# Patient Record
Sex: Male | Born: 1935 | Race: White | Hispanic: No | Smoking: Former smoker
Health system: Southern US, Community
[De-identification: ages and names within clinical notes are randomized; demographics above are authoritative.]

## PROBLEM LIST (undated history)

## (undated) DIAGNOSIS — N2889 Other specified disorders of kidney and ureter: Secondary | ICD-10-CM

## (undated) DIAGNOSIS — M1990 Unspecified osteoarthritis, unspecified site: Secondary | ICD-10-CM

## (undated) DIAGNOSIS — C801 Malignant (primary) neoplasm, unspecified: Secondary | ICD-10-CM

## (undated) DIAGNOSIS — C61 Malignant neoplasm of prostate: Secondary | ICD-10-CM

## (undated) DIAGNOSIS — Z905 Acquired absence of kidney: Secondary | ICD-10-CM

## (undated) DIAGNOSIS — Z87442 Personal history of urinary calculi: Secondary | ICD-10-CM

## (undated) DIAGNOSIS — Z9289 Personal history of other medical treatment: Secondary | ICD-10-CM

## (undated) DIAGNOSIS — N189 Chronic kidney disease, unspecified: Secondary | ICD-10-CM

## (undated) DIAGNOSIS — H3530 Unspecified macular degeneration: Secondary | ICD-10-CM

## (undated) DIAGNOSIS — I10 Essential (primary) hypertension: Secondary | ICD-10-CM

## (undated) DIAGNOSIS — I2510 Atherosclerotic heart disease of native coronary artery without angina pectoris: Secondary | ICD-10-CM

## (undated) DIAGNOSIS — I219 Acute myocardial infarction, unspecified: Secondary | ICD-10-CM

## (undated) DIAGNOSIS — R42 Dizziness and giddiness: Secondary | ICD-10-CM

## (undated) DIAGNOSIS — N429 Disorder of prostate, unspecified: Secondary | ICD-10-CM

## (undated) DIAGNOSIS — E785 Hyperlipidemia, unspecified: Secondary | ICD-10-CM

## (undated) HISTORY — DX: Unspecified osteoarthritis, unspecified site: M19.90

## (undated) HISTORY — DX: Disorder of prostate, unspecified: N42.9

## (undated) HISTORY — DX: Acquired absence of kidney: Z90.5

## (undated) HISTORY — DX: Essential (primary) hypertension: I10

## (undated) HISTORY — PX: JOINT REPLACEMENT: SHX530

## (undated) HISTORY — DX: Hyperlipidemia, unspecified: E78.5

## (undated) HISTORY — PX: KIDNEY SURGERY: SHX687

## (undated) HISTORY — DX: Chronic kidney disease, unspecified: N18.9

## (undated) HISTORY — DX: Acute myocardial infarction, unspecified: I21.9

## (undated) HISTORY — PX: OTHER SURGICAL HISTORY: SHX169

## (undated) HISTORY — DX: Personal history of other medical treatment: Z92.89

## (undated) HISTORY — DX: Atherosclerotic heart disease of native coronary artery without angina pectoris: I25.10

## (undated) HISTORY — PX: PROSTATE BIOPSY: SHX241

---

## 1991-12-06 DIAGNOSIS — I219 Acute myocardial infarction, unspecified: Secondary | ICD-10-CM

## 1991-12-06 HISTORY — DX: Acute myocardial infarction, unspecified: I21.9

## 1991-12-06 HISTORY — PX: ANGIOPLASTY: SHX39

## 1995-12-06 HISTORY — PX: OTHER SURGICAL HISTORY: SHX169

## 1996-03-15 HISTORY — PX: CORONARY ANGIOPLASTY WITH STENT PLACEMENT: SHX49

## 2000-12-05 HISTORY — PX: OTHER SURGICAL HISTORY: SHX169

## 2000-12-28 ENCOUNTER — Ambulatory Visit (HOSPITAL_BASED_OUTPATIENT_CLINIC_OR_DEPARTMENT_OTHER): Admission: RE | Admit: 2000-12-28 | Discharge: 2000-12-28 | Payer: Self-pay | Admitting: *Deleted

## 2001-03-15 ENCOUNTER — Encounter (INDEPENDENT_AMBULATORY_CARE_PROVIDER_SITE_OTHER): Payer: Self-pay | Admitting: Specialist

## 2001-03-15 ENCOUNTER — Other Ambulatory Visit: Admission: RE | Admit: 2001-03-15 | Discharge: 2001-03-15 | Payer: Self-pay | Admitting: Urology

## 2001-03-29 ENCOUNTER — Encounter: Admission: RE | Admit: 2001-03-29 | Discharge: 2001-03-29 | Payer: Self-pay | Admitting: Urology

## 2001-03-29 ENCOUNTER — Encounter: Payer: Self-pay | Admitting: Urology

## 2001-04-03 ENCOUNTER — Ambulatory Visit (HOSPITAL_COMMUNITY): Admission: RE | Admit: 2001-04-03 | Discharge: 2001-04-03 | Payer: Self-pay | Admitting: Urology

## 2001-04-03 ENCOUNTER — Encounter: Payer: Self-pay | Admitting: Urology

## 2001-04-05 ENCOUNTER — Encounter: Admission: RE | Admit: 2001-04-05 | Discharge: 2001-07-04 | Payer: Self-pay | Admitting: Radiation Oncology

## 2001-04-06 ENCOUNTER — Ambulatory Visit (HOSPITAL_COMMUNITY): Admission: RE | Admit: 2001-04-06 | Discharge: 2001-04-06 | Payer: Self-pay | Admitting: Cardiovascular Disease

## 2001-04-06 ENCOUNTER — Encounter: Payer: Self-pay | Admitting: Cardiovascular Disease

## 2001-05-03 ENCOUNTER — Inpatient Hospital Stay (HOSPITAL_COMMUNITY): Admission: RE | Admit: 2001-05-03 | Discharge: 2001-05-09 | Payer: Self-pay | Admitting: Urology

## 2001-05-03 ENCOUNTER — Encounter (INDEPENDENT_AMBULATORY_CARE_PROVIDER_SITE_OTHER): Payer: Self-pay | Admitting: Specialist

## 2001-05-03 ENCOUNTER — Encounter: Payer: Self-pay | Admitting: Urology

## 2001-07-09 ENCOUNTER — Ambulatory Visit: Admission: RE | Admit: 2001-07-09 | Discharge: 2001-10-07 | Payer: Self-pay | Admitting: Radiation Oncology

## 2001-08-24 ENCOUNTER — Encounter: Payer: Self-pay | Admitting: Urology

## 2001-09-27 ENCOUNTER — Ambulatory Visit (HOSPITAL_COMMUNITY): Admission: RE | Admit: 2001-09-27 | Discharge: 2001-09-27 | Payer: Self-pay | Admitting: Urology

## 2001-09-27 ENCOUNTER — Encounter: Payer: Self-pay | Admitting: Urology

## 2001-10-19 ENCOUNTER — Ambulatory Visit: Admission: RE | Admit: 2001-10-19 | Discharge: 2002-01-17 | Payer: Self-pay | Admitting: Radiation Oncology

## 2002-05-14 ENCOUNTER — Encounter: Admission: RE | Admit: 2002-05-14 | Discharge: 2002-05-14 | Payer: Self-pay | Admitting: Urology

## 2002-05-14 ENCOUNTER — Encounter: Payer: Self-pay | Admitting: Urology

## 2002-12-05 HISTORY — PX: OTHER SURGICAL HISTORY: SHX169

## 2003-03-01 ENCOUNTER — Emergency Department (HOSPITAL_COMMUNITY): Admission: EM | Admit: 2003-03-01 | Discharge: 2003-03-01 | Payer: Self-pay | Admitting: *Deleted

## 2003-09-10 ENCOUNTER — Encounter (INDEPENDENT_AMBULATORY_CARE_PROVIDER_SITE_OTHER): Payer: Self-pay | Admitting: *Deleted

## 2003-09-10 ENCOUNTER — Ambulatory Visit (HOSPITAL_COMMUNITY): Admission: RE | Admit: 2003-09-10 | Discharge: 2003-09-10 | Payer: Self-pay | Admitting: *Deleted

## 2003-12-06 HISTORY — PX: OTHER SURGICAL HISTORY: SHX169

## 2003-12-22 ENCOUNTER — Observation Stay (HOSPITAL_COMMUNITY): Admission: EM | Admit: 2003-12-22 | Discharge: 2003-12-23 | Payer: Self-pay | Admitting: *Deleted

## 2003-12-22 HISTORY — PX: CARDIAC CATHETERIZATION: SHX172

## 2004-08-11 ENCOUNTER — Ambulatory Visit (HOSPITAL_COMMUNITY): Admission: RE | Admit: 2004-08-11 | Discharge: 2004-08-11 | Payer: Self-pay | Admitting: Urology

## 2004-08-30 ENCOUNTER — Encounter (INDEPENDENT_AMBULATORY_CARE_PROVIDER_SITE_OTHER): Payer: Self-pay | Admitting: *Deleted

## 2004-08-30 ENCOUNTER — Inpatient Hospital Stay (HOSPITAL_COMMUNITY): Admission: RE | Admit: 2004-08-30 | Discharge: 2004-09-03 | Payer: Self-pay | Admitting: Urology

## 2005-02-21 ENCOUNTER — Inpatient Hospital Stay (HOSPITAL_COMMUNITY): Admission: RE | Admit: 2005-02-21 | Discharge: 2005-02-24 | Payer: Self-pay | Admitting: Orthopedic Surgery

## 2005-04-01 ENCOUNTER — Ambulatory Visit: Payer: Self-pay | Admitting: Oncology

## 2005-11-14 ENCOUNTER — Ambulatory Visit: Payer: Self-pay | Admitting: Oncology

## 2006-03-15 ENCOUNTER — Encounter: Admission: RE | Admit: 2006-03-15 | Discharge: 2006-04-07 | Payer: Self-pay | Admitting: *Deleted

## 2006-05-17 ENCOUNTER — Ambulatory Visit: Payer: Self-pay | Admitting: Oncology

## 2006-07-30 ENCOUNTER — Emergency Department (HOSPITAL_COMMUNITY): Admission: EM | Admit: 2006-07-30 | Discharge: 2006-07-30 | Payer: Self-pay | Admitting: Emergency Medicine

## 2006-10-09 ENCOUNTER — Ambulatory Visit (HOSPITAL_COMMUNITY): Admission: RE | Admit: 2006-10-09 | Discharge: 2006-10-09 | Payer: Self-pay | Admitting: Oncology

## 2006-11-15 ENCOUNTER — Ambulatory Visit: Payer: Self-pay | Admitting: Oncology

## 2007-11-23 ENCOUNTER — Ambulatory Visit: Payer: Self-pay | Admitting: Oncology

## 2007-11-28 ENCOUNTER — Ambulatory Visit: Payer: Self-pay | Admitting: *Deleted

## 2007-11-28 ENCOUNTER — Ambulatory Visit (HOSPITAL_COMMUNITY): Admission: RE | Admit: 2007-11-28 | Discharge: 2007-11-28 | Payer: Self-pay | Admitting: Family Medicine

## 2007-11-28 ENCOUNTER — Encounter (INDEPENDENT_AMBULATORY_CARE_PROVIDER_SITE_OTHER): Payer: Self-pay | Admitting: Family Medicine

## 2008-02-10 ENCOUNTER — Observation Stay (HOSPITAL_COMMUNITY): Admission: EM | Admit: 2008-02-10 | Discharge: 2008-02-11 | Payer: Self-pay | Admitting: Emergency Medicine

## 2009-05-02 ENCOUNTER — Encounter: Payer: Self-pay | Admitting: Emergency Medicine

## 2009-05-02 ENCOUNTER — Inpatient Hospital Stay (HOSPITAL_COMMUNITY): Admission: EM | Admit: 2009-05-02 | Discharge: 2009-05-07 | Payer: Self-pay | Admitting: Cardiology

## 2009-05-02 ENCOUNTER — Ambulatory Visit: Payer: Self-pay | Admitting: Diagnostic Radiology

## 2009-05-05 ENCOUNTER — Encounter (INDEPENDENT_AMBULATORY_CARE_PROVIDER_SITE_OTHER): Payer: Self-pay | Admitting: General Surgery

## 2009-05-05 HISTORY — PX: CHOLECYSTECTOMY: SHX55

## 2009-07-06 ENCOUNTER — Inpatient Hospital Stay (HOSPITAL_COMMUNITY): Admission: RE | Admit: 2009-07-06 | Discharge: 2009-07-10 | Payer: Self-pay | Admitting: Orthopedic Surgery

## 2009-08-03 ENCOUNTER — Encounter: Admission: RE | Admit: 2009-08-03 | Discharge: 2009-08-31 | Payer: Self-pay | Admitting: Orthopedic Surgery

## 2010-07-08 ENCOUNTER — Inpatient Hospital Stay (HOSPITAL_COMMUNITY): Admission: AD | Admit: 2010-07-08 | Discharge: 2010-07-10 | Payer: Self-pay | Admitting: Cardiovascular Disease

## 2010-07-09 HISTORY — PX: CARDIAC CATHETERIZATION: SHX172

## 2010-08-04 DIAGNOSIS — Z9289 Personal history of other medical treatment: Secondary | ICD-10-CM

## 2010-08-04 HISTORY — DX: Personal history of other medical treatment: Z92.89

## 2011-01-26 NOTE — Discharge Summary (Signed)
NAME:  Donald Todd, Donald Todd NO.:  [id number]  MEDICAL RECORD NO.:  [id number]            PATIENT TYPE:  LOCATION:                                 FACILITY:  PHYSICIAN:  Nanetta Batty, M.D.   DATE OF BIRTH:  01-Feb-1929  DATE OF ADMISSION:  07/08/2010 DATE OF DISCHARGE:  07/10/2010                              DISCHARGE SUMMARY   DISCHARGE DIAGNOSES: 1. Unstable angina, catheterization this admission, plan is for     continued medical therapy. 2. Known coronary artery disease with right coronary artery and ramus     intermedius stenting in the 1990s, patent at this admission, 60% to     70% circumflex this admission, plan for medical therapy only. 3. Normal left ventricular function with an ejection fraction of 55%. 4. History of left nephrectomy for renal cell cancer, the patient does     have 40% right renal artery narrowing. 5. Stage III renal insufficiency with a creatinine of 1.4 to 1.8. 6. Treated dyslipidemia. 7. History of hypertension, which is controlled. 8. History of gallstone pancreatitis, status post laparoscopic     cholecystectomy in May 2010.  HOSPITAL COURSE:  The patient is a pleasant 77 year old male followed by Dr. Allyson Sabal.  He has had previous RCA and ramus stenting in 1997.  He was studied in 2005 and stents were patent with moderate disease in the LAD. 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  He has seen Dr. Allyson Sabal as an outpatient on July 05, 2010.  He had been having chest pain as an outpatient, which Dr. Allyson Sabal felt was consistent with angina.  This has been gradually worsening over the last couple weeks.  He is on maximal medical therapy. He was admitted for hydration and to hold his ACE inhibitor prior to catheterization.  He also has a history of contrast allergy and he will need to be premedicated for that.  The patient was admitted on July 08, 2010.  Creatinine was 1.49.  He  was premedicated for catheterization and this was done on July 09, 2010.  Proximal RCA stent was patent.  Left main was normal.  Circumflex had a 60% to 70% mid narrowing.  The ramus stent was patent.  The LAD had a 30% to 40% mid narrowing after the second diagonal.  Ejection fraction was 55%.  Plan is for continued medical therapy.  The patient was kept and hydrated overnight.  His creatinine at discharge is 1.53.  Dr. Tresa Endo felt he could be discharged on July 10, 2010.  He will follow up with Dr. Allyson Sabal as an outpatient. Dr. Tresa Endo did suggest an outpatient Myoview study to further evaluate the circumflex.  LABS AT DISCHARGE:  Sodium 145, potassium 4.3, BUN 17, creatinine 1.53. White count 5.4, hemoglobin 11.6, hematocrit 34.8, platelets 116.  Chest x-ray shows mild cardiomegaly, but no acute disease.  EKG shows sinus rhythm, sinus bradycardia with first-degree AV block and left anterior fascicular block, poor anterior R-wave progression.  Please see med rec for complete discharge medications.  DISPOSITION:  The patient was discharged  in stable condition and will follow up with Dr. Allyson Sabal.     Abelino Derrick, P.A.   ______________________________ Nanetta Batty, M.D.    Lenard Lance  D:  01/11/2011  T:  01/12/2011  Job:  [id number]  Electronically Signed by Corine Shelter P.A. on 01/14/2011 04:00:30 PM Electronically Signed by Nanetta Batty M.D. on 01/26/2011 11:33:59 AM

## 2011-02-18 LAB — COMPREHENSIVE METABOLIC PANEL
ALT: 15 U/L (ref 0–53)
AST: 16 U/L (ref 0–37)
Albumin: 2.9 g/dL — ABNORMAL LOW (ref 3.5–5.2)
Calcium: 8.6 mg/dL (ref 8.4–10.5)
Creatinine, Ser: 1.53 mg/dL — ABNORMAL HIGH (ref 0.4–1.5)
GFR calc Af Amer: 53 mL/min — ABNORMAL LOW (ref >60)
Sodium: 145 mEq/L (ref 135–145)
Total Protein: 5.1 g/dL — ABNORMAL LOW (ref 6.0–8.3)

## 2011-02-18 LAB — URINALYSIS, ROUTINE W REFLEX MICROSCOPIC
Bilirubin Urine: NEGATIVE
Hgb urine dipstick: NEGATIVE
Ketones, ur: NEGATIVE mg/dL
Specific Gravity, Urine: 1.01 (ref 1.005–1.030)
pH: 5.5 (ref 5.0–8.0)

## 2011-02-18 LAB — PROTIME-INR
INR: 1.06 (ref 0.00–1.49)
Prothrombin Time: 14 seconds (ref 11.6–15.2)
Prothrombin Time: 14.2 seconds (ref 11.6–15.2)

## 2011-02-18 LAB — BASIC METABOLIC PANEL
BUN: 19 mg/dL (ref 6–23)
CO2: 25 mEq/L (ref 19–32)
CO2: 26 mEq/L (ref 19–32)
Calcium: 8.9 mg/dL (ref 8.4–10.5)
Calcium: 9 mg/dL (ref 8.4–10.5)
Chloride: 112 mEq/L (ref 96–112)
Creatinine, Ser: 1.49 mg/dL (ref 0.4–1.5)
Creatinine, Ser: 1.5 mg/dL (ref 0.4–1.5)
GFR calc Af Amer: 55 mL/min — ABNORMAL LOW (ref >60)
GFR calc Af Amer: 55 mL/min — ABNORMAL LOW (ref >60)
GFR calc non Af Amer: 45 mL/min — ABNORMAL LOW (ref >60)
Glucose, Bld: 130 mg/dL — ABNORMAL HIGH (ref 70–99)
Potassium: 4 mEq/L (ref 3.5–5.1)
Sodium: 140 mEq/L (ref 135–145)
Sodium: 141 mEq/L (ref 135–145)

## 2011-02-18 LAB — CBC
Hemoglobin: 12.6 g/dL — ABNORMAL LOW (ref 13.0–17.0)
MCH: 29.7 pg (ref 26.0–34.0)
MCH: 29.8 pg (ref 26.0–34.0)
MCH: 30.6 pg (ref 26.0–34.0)
MCHC: 33.3 g/dL (ref 30.0–36.0)
MCHC: 34.7 g/dL (ref 30.0–36.0)
MCV: 88.9 fL (ref 78.0–100.0)
Platelets: 116 10*3/uL — ABNORMAL LOW (ref 150–400)
Platelets: 118 10*3/uL — ABNORMAL LOW (ref 150–400)
Platelets: 131 10*3/uL — ABNORMAL LOW (ref 150–400)
RBC: 4.12 MIL/uL — ABNORMAL LOW (ref 4.22–5.81)
RDW: 13.2 % (ref 11.5–15.5)
WBC: 6.5 10*3/uL (ref 4.0–10.5)

## 2011-02-18 LAB — APTT: aPTT: 31 seconds (ref 24–37)

## 2011-03-12 LAB — BASIC METABOLIC PANEL
BUN: 15 mg/dL (ref 6–23)
CO2: 27 mEq/L (ref 19–32)
CO2: 28 mEq/L (ref 19–32)
CO2: 29 mEq/L (ref 19–32)
Calcium: 8.5 mg/dL (ref 8.4–10.5)
Chloride: 101 mEq/L (ref 96–112)
Chloride: 101 mEq/L (ref 96–112)
Chloride: 102 mEq/L (ref 96–112)
Creatinine, Ser: 1.43 mg/dL (ref 0.4–1.5)
Creatinine, Ser: 1.51 mg/dL — ABNORMAL HIGH (ref 0.4–1.5)
GFR calc Af Amer: 54 mL/min — ABNORMAL LOW (ref >60)
GFR calc non Af Amer: 49 mL/min — ABNORMAL LOW (ref >60)
Glucose, Bld: 117 mg/dL — ABNORMAL HIGH (ref 70–99)
Glucose, Bld: 135 mg/dL — ABNORMAL HIGH (ref 70–99)
Glucose, Bld: 138 mg/dL — ABNORMAL HIGH (ref 70–99)
Glucose, Bld: 176 mg/dL — ABNORMAL HIGH (ref 70–99)
Potassium: 4.7 mEq/L (ref 3.5–5.1)
Potassium: 4.8 mEq/L (ref 3.5–5.1)
Sodium: 133 mEq/L — ABNORMAL LOW (ref 135–145)

## 2011-03-12 LAB — CBC
HCT: 30.3 % — ABNORMAL LOW (ref 39.0–52.0)
HCT: 33.1 % — ABNORMAL LOW (ref 39.0–52.0)
Hemoglobin: 11.4 g/dL — ABNORMAL LOW (ref 13.0–17.0)
MCHC: 34.1 g/dL (ref 30.0–36.0)
MCHC: 34.3 g/dL (ref 30.0–36.0)
MCV: 89.5 fL (ref 78.0–100.0)
MCV: 90.2 fL (ref 78.0–100.0)
MCV: 90.5 fL (ref 78.0–100.0)
Platelets: 125 10*3/uL — ABNORMAL LOW (ref 150–400)
RBC: 3.47 MIL/uL — ABNORMAL LOW (ref 4.22–5.81)
RDW: 13.8 % (ref 11.5–15.5)
RDW: 13.9 % (ref 11.5–15.5)

## 2011-03-12 LAB — PROTIME-INR: Prothrombin Time: 26 seconds — ABNORMAL HIGH (ref 11.6–15.2)

## 2011-03-13 LAB — PROTIME-INR
INR: 1 (ref 0.00–1.49)
Prothrombin Time: 13.7 seconds (ref 11.6–15.2)

## 2011-03-13 LAB — URINALYSIS, ROUTINE W REFLEX MICROSCOPIC
Bilirubin Urine: NEGATIVE
Hgb urine dipstick: NEGATIVE
Ketones, ur: NEGATIVE mg/dL
Nitrite: NEGATIVE
pH: 5.5 (ref 5.0–8.0)

## 2011-03-13 LAB — COMPREHENSIVE METABOLIC PANEL
Albumin: 3.6 g/dL (ref 3.5–5.2)
BUN: 19 mg/dL (ref 6–23)
Chloride: 108 mEq/L (ref 96–112)
Creatinine, Ser: 1.6 mg/dL — ABNORMAL HIGH (ref 0.4–1.5)
Total Bilirubin: 0.8 mg/dL (ref 0.3–1.2)
Total Protein: 6.3 g/dL (ref 6.0–8.3)

## 2011-03-13 LAB — CBC
HCT: 39.9 % (ref 39.0–52.0)
MCV: 89.4 fL (ref 78.0–100.0)
Platelets: 142 10*3/uL — ABNORMAL LOW (ref 150–400)
RDW: 13.8 % (ref 11.5–15.5)

## 2011-03-13 LAB — APTT: aPTT: 24 seconds (ref 24–37)

## 2011-03-13 LAB — TYPE AND SCREEN

## 2011-03-14 LAB — CBC
HCT: 35.3 % — ABNORMAL LOW (ref 39.0–52.0)
HCT: 38.6 % — ABNORMAL LOW (ref 39.0–52.0)
Hemoglobin: 12.1 g/dL — ABNORMAL LOW (ref 13.0–17.0)
Hemoglobin: 13.3 g/dL (ref 13.0–17.0)
MCHC: 34.2 g/dL (ref 30.0–36.0)
MCV: 90.4 fL (ref 78.0–100.0)
MCV: 90.6 fL (ref 78.0–100.0)
RBC: 3.91 MIL/uL — ABNORMAL LOW (ref 4.22–5.81)
RBC: 4.26 MIL/uL (ref 4.22–5.81)
WBC: 7.4 10*3/uL (ref 4.0–10.5)

## 2011-03-14 LAB — COMPREHENSIVE METABOLIC PANEL
Albumin: 3.1 g/dL — ABNORMAL LOW (ref 3.5–5.2)
Alkaline Phosphatase: 139 U/L — ABNORMAL HIGH (ref 39–117)
BUN: 8 mg/dL (ref 6–23)
CO2: 28 mEq/L (ref 19–32)
Chloride: 102 mEq/L (ref 96–112)
Creatinine, Ser: 1.47 mg/dL (ref 0.4–1.5)
GFR calc non Af Amer: 46 mL/min — ABNORMAL LOW (ref >60)
Glucose, Bld: 122 mg/dL — ABNORMAL HIGH (ref 70–99)
Potassium: 3.9 mEq/L (ref 3.5–5.1)
Total Bilirubin: 1.2 mg/dL (ref 0.3–1.2)

## 2011-03-14 LAB — MAGNESIUM
Magnesium: 1.7 mg/dL (ref 1.5–2.5)
Magnesium: 1.9 mg/dL (ref 1.5–2.5)

## 2011-03-14 LAB — BASIC METABOLIC PANEL
Calcium: 9 mg/dL (ref 8.4–10.5)
GFR calc non Af Amer: 49 mL/min — ABNORMAL LOW (ref >60)
Glucose, Bld: 121 mg/dL — ABNORMAL HIGH (ref 70–99)
Potassium: 3.9 mEq/L (ref 3.5–5.1)
Sodium: 137 mEq/L (ref 135–145)

## 2011-03-14 LAB — LIPASE, BLOOD: Lipase: 43 U/L (ref 11–59)

## 2011-03-14 LAB — LIPID PANEL
Cholesterol: 124 mg/dL (ref 0–200)
HDL: 39 mg/dL — ABNORMAL LOW (ref >39)

## 2011-03-15 LAB — DIFFERENTIAL
Basophils Absolute: 0 10*3/uL (ref 0.0–0.1)
Basophils Relative: 1 % (ref 0–1)
Monocytes Absolute: 0.5 10*3/uL (ref 0.1–1.0)
Neutro Abs: 6.4 10*3/uL (ref 1.7–7.7)
Neutrophils Relative %: 72 % (ref 43–77)

## 2011-03-15 LAB — COMPREHENSIVE METABOLIC PANEL
ALT: 151 U/L — ABNORMAL HIGH (ref 0–53)
ALT: 235 U/L — ABNORMAL HIGH (ref 0–53)
ALT: 327 U/L — ABNORMAL HIGH (ref 0–53)
AST: 159 U/L — ABNORMAL HIGH (ref 0–37)
AST: 350 U/L — ABNORMAL HIGH (ref 0–37)
Albumin: 3.7 g/dL (ref 3.5–5.2)
Alkaline Phosphatase: 183 U/L — ABNORMAL HIGH (ref 39–117)
Alkaline Phosphatase: 216 U/L — ABNORMAL HIGH (ref 39–117)
BUN: 12 mg/dL (ref 6–23)
BUN: 24 mg/dL — ABNORMAL HIGH (ref 6–23)
CO2: 27 mEq/L (ref 19–32)
Calcium: 8.9 mg/dL (ref 8.4–10.5)
Calcium: 9 mg/dL (ref 8.4–10.5)
Calcium: 9.6 mg/dL (ref 8.4–10.5)
Chloride: 108 mEq/L (ref 96–112)
Chloride: 108 mEq/L (ref 96–112)
Chloride: 106 mEq/L (ref 96–112)
Creatinine, Ser: 1.64 mg/dL — ABNORMAL HIGH (ref 0.4–1.5)
GFR calc Af Amer: 49 mL/min — ABNORMAL LOW (ref >60)
GFR calc Af Amer: 51 mL/min — ABNORMAL LOW (ref >60)
GFR calc non Af Amer: 42 mL/min — ABNORMAL LOW (ref >60)
Glucose, Bld: 101 mg/dL — ABNORMAL HIGH (ref 70–99)
Glucose, Bld: 112 mg/dL — ABNORMAL HIGH (ref 70–99)
Potassium: 4 mEq/L (ref 3.5–5.1)
Potassium: 4 mEq/L (ref 3.5–5.1)
Sodium: 141 mEq/L (ref 135–145)
Sodium: 142 mEq/L (ref 135–145)
Sodium: 142 mEq/L (ref 135–145)
Total Bilirubin: 2.1 mg/dL — ABNORMAL HIGH (ref 0.3–1.2)
Total Protein: 5.4 g/dL — ABNORMAL LOW (ref 6.0–8.3)

## 2011-03-15 LAB — CBC
HCT: 43.1 % (ref 39.0–52.0)
Hemoglobin: 12.1 g/dL — ABNORMAL LOW (ref 13.0–17.0)
Hemoglobin: 14.6 g/dL (ref 13.0–17.0)
MCHC: 33.2 g/dL (ref 30.0–36.0)
MCHC: 34.1 g/dL (ref 30.0–36.0)
MCHC: 34.3 g/dL (ref 30.0–36.0)
MCV: 89.9 fL (ref 78.0–100.0)
Platelets: 144 10*3/uL — ABNORMAL LOW (ref 150–400)
RBC: 4.2 MIL/uL — ABNORMAL LOW (ref 4.22–5.81)
RBC: 4.61 MIL/uL (ref 4.22–5.81)
RDW: 13.8 % (ref 11.5–15.5)
WBC: 5.8 10*3/uL (ref 4.0–10.5)
WBC: 7.1 10*3/uL (ref 4.0–10.5)
WBC: 8.8 10*3/uL (ref 4.0–10.5)

## 2011-03-15 LAB — HEPATITIS PANEL, ACUTE
HCV Ab: NEGATIVE
Hep A IgM: NEGATIVE

## 2011-03-15 LAB — AMYLASE: Amylase: 848 U/L — ABNORMAL HIGH (ref 27–131)

## 2011-03-15 LAB — LIPASE, BLOOD
Lipase: 3540 U/L — ABNORMAL HIGH (ref 11–59)
Lipase: 71 U/L — ABNORMAL HIGH (ref 11–59)

## 2011-03-15 LAB — MAGNESIUM: Magnesium: 2.1 mg/dL (ref 1.5–2.5)

## 2011-03-15 LAB — LIPID PANEL: Cholesterol: 138 mg/dL (ref 0–200)

## 2011-03-15 LAB — POCT CARDIAC MARKERS
CKMB, poc: 1.2 ng/mL (ref 1.0–8.0)
CKMB, poc: 1.9 ng/mL (ref 1.0–8.0)
Myoglobin, poc: 171 ng/mL (ref 12–200)

## 2011-03-15 LAB — TSH: TSH: 1.331 u[IU]/mL (ref 0.350–4.500)

## 2011-03-15 LAB — HEMOGLOBIN A1C
Hgb A1c MFr Bld: 5.8 % (ref 4.6–6.1)
Mean Plasma Glucose: 120 mg/dL

## 2011-04-19 NOTE — Op Note (Signed)
NAME:  Donald Todd, Donald Todd NO.:  [id number]   MEDICAL RECORD NO.:  [id number]          PATIENT TYPE:  INP   LOCATION:  4703                         FACILITY:  [facility]   PHYSICIAN:  Sharlet Salina T. Hoxworth, M.D.DATE OF BIRTH:  [birth year]   DATE OF PROCEDURE:  05/05/2009  DATE OF DISCHARGE:                               OPERATIVE REPORT   PREOPERATIVE DIAGNOSIS:  Gallstone pancreatitis.   POSTOPERATIVE DIAGNOSIS:  Gallstone pancreatitis.   SURGICAL PROCEDURES:  Laparoscopic cholecystectomy with intraoperative  cholangiogram.   SURGEON:  Sharlet Salina T. Hoxworth, MD   ANESTHESIA:  General.   BRIEF HISTORY:  Mr. Brawley is a 77 year old male who presented with  acute severe abdominal pain, was found to have gallstones and evidence  of acute pancreatitis, which has clinically resolved.  We would  recommend proceeding with laparoscopic cholecystectomy with  cholangiogram to prevent recurrence and further complications from his  gallstones.  The nature of the procedure, its indications, risks of  bleeding, infection, bile leak, bile duct injury, and possible need for  procedure and anesthetic complications were discussed and understood.  He was now brought to the operating room for this procedure.   DESCRIPTION OF OPERATION:  The patient was brought to the operating room  and placed in supine position on the operating table, and general  endotracheal anesthesia was induced.  He received preoperative IV  antibiotics.  PAS were placed.  The abdomen was widely and sterilely,  prepped and draped.  Correct patient and procedure were verified.  Local  anesthesia was used to infiltrate the trocar sites.  Access was obtained  with a 1 cm incision at the umbilicus, open Hasson technique through a  mattress suture of 0 Vicryl and pneumoperitoneum established.  Under  direct vision, a 11-mm trocar was placed in subxiphoid area and two 5-mm  trocars along the right subcostal margin.  The  gallbladder was  visualized and did not appear significantly inflamed.  The fundus was  grasped and elevated up over the liver and infundibulum retracted  inferolaterally.  Peritoneum anterior and posterior to closed triangle  was incised and fibrofatty tissue was stripped off the neck of the  gallbladder toward the porta hepatis.  The distal gallbladder and closed  triangle was thoroughly dissected.  The cystic artery was found lying  anterior and inferior to the cystic duct, was divided between 2 proximal  and distal clip.  The cystic duct was then dissected out over about a  centimeter and half.  The cystic duct gallbladder junction was dissected  at 306 degrees.  The anatomy was clear.  The cystic duct was clipped in  gallbladder junction and operative cholangiogram was obtained, which  showed good filling of normal common bile duct and intrahepatic ducts  with free flow into the duodenum and no filling defects.  Following  this, cholangiocath was removed and the cystic duct was triply clipped  proximally and divided.  The gallbladder was then dissected free from  its bed using hook cautery and removed through the umbilicus.  There was  little bleeding from the gallbladder bed, it was controlled with  cautery  and a Surgicel pack.  The right upper  quadrant was irrigated complete hemostasis was assured.  Trocars were  then removed and all CO2 evacuated.  The mattress suture was secured at  the umbilicus.  Skin incisions were closed with subcuticular Monocryl  and Steri-Strips.  The patient was taken to recovery room in good  condition.      Lorne Skeens. Hoxworth, M.D.  Electronically Signed     BTH/MEDQ  D:  05/05/2009  T:  05/06/2009  Job:  [id number]

## 2011-04-19 NOTE — Discharge Summary (Signed)
NAME:  Donald Todd, Donald Todd NO.:  [id number]   MEDICAL RECORD NO.:  [id number]          PATIENT TYPE:  INP   LOCATION:  4703                         FACILITY:  [facility]   PHYSICIAN:  Petra Kuba, M.D.    DATE OF BIRTH:  Jun 28, 1929   DATE OF ADMISSION:  05/02/2009  DATE OF DISCHARGE:  05/07/2009                               DISCHARGE SUMMARY   ADMIT DIAGNOSIS:  Gallstone pancreatitis.   DISCHARGE DIAGNOSES:  1. Gallstone pancreatitis, status post laparoscopic cholecystectomy      also an episode of asymptomatic supraventricular tachycardia.   PAST MEDICAL HISTORY:  The rest of his discharge diagnoses are  consistent with his past medical history and include:  1. Coronary artery disease.  2. Hypertension.  3. Cholesterolemia.  4. Chronic renal insufficiency.  5. History of renal cell cancer.  6. History of prostate cancer as well as osteoarthritis.   CONSULTS:  1. Gastroenterology was consulted on May 02, 2009.  2. Surgery was consulted on May 03, 2009.   PROCEDURES:  The patient had a laparoscopic cholecystectomy with  intraoperative cholangiography on May 05, 2009.  This was done by Dr.  Glenna Fellows without complication.  The intraoperative  cholangiography was found to be negative.   PERTINENT RADIOLOGICAL EXAMINATIONS:  CT abdomen and pelvis on May 02, 2009, significant for:  1. Inflammatory changes about the pancreas worrisome for acute      pancreatitis.  2. Cholelithiasis.  3. Right inguinal hernia containing adipose tissue.  Also, on May 05, 2009, the patient had an intraoperative cholangiogram that was      negative for retained common bile duct stones.   PERTINENT LABORATORIES THIS ADMISSION:  On the day of admission, May 02, 2009, the patient had point of care cardiac enzymes that were as  follows, CK-MB 1.2, troponin 0.05, and myoglobin 107.   On May 02, 2009, he had an amylase of 3364 and he had a lipase of 3540.  LFTs on May 02, 2009, were as follows; AST 350, ALT 327, total bilirubin  4.3, and alkaline phosphatase 89.   On May 06, 2009, the patient's LFTs were as follows; AST 49, ALT 93, alk  phos 139, and total bilirubin 1.2.  Lipase normalized to 43 on May 05, 2009.   HISTORY AND BRIEF HOSPITAL COURSE:  Mr. Mounts is a 77 year old male who  has a history of prostate cancer and renal cell cancer.  He was admitted  on May 02, 2009, to the Cardiology Service for chest pain.  After  negative cardiac workup, the cardiologist consulted [facility]  Gastroenterology with Dr. Vida Rigger who assessed the patient and  diagnosed and was gallstone pancreatitis.  Surgery was then consulted.  The patient improved quickly with clear liquid diet and pain  medications.  Laparoscopic cholecystectomy was scheduled for May 05, 2009, and took place without complication.  The patient was then  followed for another 48 hours to monitor him for any postop  complications or cardiac effects.  On May 05, 2009, he did have episodes  of asymptomatic SVT.  He was seen by Dr. Allyson Sabal who recommended  outpatient stress testing to exclude cardiac ischemia.  Today, on May 07, 2009, the patient is alert and oriented.  He is tolerating a regular  diet.  He has not used any pain medications since his surgery and has no  complaints of pain.  He is able to ambulate around the room with ease.   PHYSICAL EXAMINATION:  HEART:  Regular rate and rhythm.  LUNGS:  Clear to auscultation.  ABDOMEN:  Bandages were clean and dry.  It is slightly tender at  surgical incision spot.  He had good bowel sounds.  He has not had a  bowel movement in 2 days.   He was seen by [facility] and Vascular extender Lezlie Octave,  this morning and cleared for discharge.  He will be discharged to home  in good condition.  Follow up appointments include an office visit with  Dr. Glenna Fellows at [facility] on May 21, 2009, at  9:00 a.m.  Also, he has  been advised to call [facility] to schedule an appointment with Dr. Allyson Sabal in 2 weeks, this will  be for an office visit and to set up an outpatient stress test,  secondary to his episodes of asymptomatic SVT in the hospital further  the patient reports that he will have knee surgery later this summer, he  will need a stress test prior to that.  The patient has been instructed  to call [facility] if his temperature rises above 101.5 or  if he has nausea, vomiting, diarrhea, constipation or if he has redness  or drainage from his wounds.   MEDICATIONS AT DISCHARGE:  1. Amlodipine 5 mg 1 tablet daily.  2. Enalapril 2.5 mg one tablet daily.  3. Imdur 90 mg daily.  4. Toprol 50 mg 1 tablet twice a day.  5. Vesicare 5 mg 1 tablet daily.  6. Vytorin 10/40 mg 1 tablet daily.  7. Fish oil 1000 mg 1 tablet daily.  8. Ocuvite with Lutein  1 tablet twice daily.  9. Prilosec OTC 1 tablet daily.  10.Aspirin 81 mg 1 tablet daily.   The patient will be discharged today to home in good condition.      Stephani Police, PA    ______________________________  Petra Kuba, M.D.    MLY/MEDQ  D:  05/07/2009  T:  05/07/2009  Job:  [id number]   cc:   Petra Kuba, M.D.  Nanetta Batty, M.D.  Lorne Skeens. Hoxworth, M.D.  Vikki Ports, M.D.

## 2011-04-19 NOTE — Op Note (Signed)
NAME:  Donald Todd, Donald NO.:  [id number]   MEDICAL RECORD NO.:  [id number]          PATIENT TYPE:  INP   LOCATION:  1612                         FACILITY:  [facility]   PHYSICIAN:  Courtney Paris, M.D.DATE OF BIRTH:  February 04, 1929   DATE OF PROCEDURE:  07/06/2009  DATE OF DISCHARGE:                               OPERATIVE REPORT   OPERATIVE NOTE AND CONSULTATION:   REASON FOR CONSULTATION:  Unable to pass a Foley catheter at surgery.   HISTORY OF PRESENT ILLNESS:  The patient is a 77 year old male with  history of prostate cancer and renal cell carcinoma.  He had a seed  implant for prostate cancer in 2002.  His current PSAs have been  undetectable.  He last saw Dr. Patsi Todd on June 24, 2009.  He also has  had a left partial nephrectomy in 2006 and a total nephrectomy for a  second tumor in 2007.  He has been on Vesicare for overactive bladder  symptoms after I-125 seed therapy.  He gets DEXA scans for osteopenia.   PROCEDURE IN DETAIL:  At surgery the nursing staff was unable to place a  Foley catheter in this patient who was having a right total knee done.  After the operation had been completed and the staples applied I was  called to the OR to pass a catheter.  The patient was still under his  regional anesthesia.  He was prepped and draped with Betadine and a  flexible cystoscope was then passed into the anterior urethra.  This was  normal.  No strictures were seen until I got down to the prostate area  and there seemed to be a mild bladder neck contracture.  Through the  scope I was able to pass a sensor guidewire into the bladder under  direct vision.  I backed this out over the guidewire and then was able  to use a Heyman dilator #22-French to dilate the bladder neck.  After  this I was able to get a #18 Council catheter over the guidewire into  the bladder atraumatically.  Clear urine was obtained.  Then 10 mL was  placed in the Foley balloon and left to  straight drainage.   PLAN:  Plan is to leave the Foley catheter until he is fully ambulatory,  but I will have Dr. Patsi Todd check him in the hospital.      Courtney Paris, M.D.  Electronically Signed     HMK/MEDQ  D:  07/06/2009  T:  07/06/2009  Job:  [id number]

## 2011-04-19 NOTE — H&P (Signed)
NAME:  Donald Todd, Donald Todd NO.:  [id number]   MEDICAL RECORD NO.:  [id number]          PATIENT TYPE:  OBV   LOCATION:  1505                         [facility]ACILITY:  [facility]   PHYSICIAN:  Corinna L. Lendell Caprice, MDDATE OF BIRTH:  11-Oct-1929   DATE OF ADMISSION:  02/10/2008  DATE OF DISCHARGE:  02/11/2008                              HISTORY & PHYSICAL   CHIEF COMPLAINT:  Fever and lower abdominal pain.   HISTORY OF PRESENT ILLNESS:  Donald Todd is a 77 year old white male with  multiple medical problems who has been ill for the past four days.  He  has dry heaves four days ago and since then has had anorexia.  He has  had no diarrhea.  He has lower abdominal pain.  He denies any dysuria.  His fever has been up to almost 102 at home.  He had some substernal and  epigastric burning after his dry heaves on Wednesday but has no  epigastric pain now.   PAST MEDICAL HISTORY:  Is significant for:  1. Coronary artery disease with stent.  2. Hypertension.  3. Macular degeneration.  4. Chronic renal insufficiency.  5. Nephrectomy for renal cell carcinoma.  6. Prostate cancer with seed implantation.  7. Hyperlipidemia.   MEDICATIONS:  1. Aspirin 325 mg a day.  2. Toprol XL 50 mg a day.  3. Imdur 60 mg a day.  4. Norvasc 5 mg a day.  5. Enalapril 2.5 mg a day.  6. Fish oil capsules.  7. Ocuvite.  8. Vytorin 10/40 mg a day.  9. Vesicare 5 mg a day.  10.Os-Cal with vitamin D.  11.Prilosec.  12.Vitamin D 1000 mg twice a day.  13.Nitroglycerin as needed which he has not used for a long time.   SOCIAL HISTORY:  The patient is here with his wife.  He does not smoke.   FAMILY HISTORY:  Is noncontributory.   REVIEW OF SYSTEMS:  As above.  Otherwise negative.   PHYSICAL EXAMINATION:  Temperature is 100.9, blood pressure 147/74,  pulse 91, respiratory rate 16, oxygen saturation 96% on room air.  GENERAL:  The patient is well-nourished, well-developed, in no acute  distress.  Able to  ambulate to the bathroom.  HEENT:  Normocephalic, atraumatic.  Pupils equal, round, reactive to  light.  Sclerae nonicteric.  Moist mucous membranes.  NECK:  Is supple.  No carotid bruits.  No lymphadenopathy.  LUNGS:  Are clear to auscultation bilaterally without wheezes, rhonchi  or rales.  CARDIOVASCULAR:  Regular rate and rhythm without murmurs, gallops or  rubs.  ABDOMEN:  Normal bowel sounds, soft, nontender, nondistended.  GENITOURINARY AND RECTAL:  Deferred.  EXTREMITIES:  No clubbing, cyanosis or edema.  Pulses are intact.  SKIN:  No rash or jaundice.  NEUROLOGIC:  Alert and oriented.  Cranial nerves and sensorimotor exam  intact.  PSYCHIATRIC:  Normal affect.   LABORATORY DATA:  His white blood cell count is 10,800 with 88%  neutrophils, 7% lymphocytes, hemoglobin 12.5, hematocrit 36.  Complete  metabolic panel significant for a creatinine of 1.69 which is about his  baseline.  Total  bilirubin of 1.8, alkaline phosphatase 161, SGOT 24,  SGPT 79, albumin 2.6.  Lipase normal.  Urinalysis shows trace ketones,  100 protein, 2 bili urobilinogen, negative nitrite, negative leukocyte  esterase, negative blood.  Preliminary reading of the CT of the abdomen  shows acute pancreatitis.   ASSESSMENT/PLAN:  1. Lower abdominal pain with fever:  Despite the CAT scan reading, the      patient's clinical presentation is not one of pancreatitis.  Also,      his labs are not indicative of a chemical pancreatitis.      Nevertheless, I will place him on 23-hour observation, clear liquid      diet, supportive care, and repeat the pancreatic enzymes in the      morning.  He has no epigastric tenderness or right upper quadrant      tenderness.  2. Coronary artery disease with stent, stable.  3. Hypertension.  4. Macular degeneration.  5. Chronic renal insufficiency, stable.  6. History of nephrectomy for renal cell carcinoma.  7. Prostate cancer.      Corinna L. Lendell Caprice, MD   Electronically Signed     CLS/MEDQ  D:  02/11/2008  T:  02/12/2008  Job:  [id number]   cc:   Vikki Ports, M.D.  Fax: [phone]   Nanetta Batty, M.D.  Fax: [phone]

## 2011-04-19 NOTE — Discharge Summary (Signed)
NAME:  Donald Todd, HABIBI NO.:  [id number]   MEDICAL RECORD NO.:  [id number]          PATIENT TYPE:  INP   LOCATION:  1612                         FACILITY:  [facility]   PHYSICIAN:  Ollen Gross, M.D.    DATE OF BIRTH:  07-31-1929   DATE OF ADMISSION:  07/06/2009  DATE OF DISCHARGE:  07/10/2009                               DISCHARGE SUMMARY   ADMITTING DIAGNOSES:  1. Osteoarthritis right knee.  2. Hypertension.  3. Coronary arterial disease.  4. History of MI (myocardial infarction) 1993.  5. History of renal cancer requiring initial partial nephrectomy and      then complete left nephrectomy.  6. Prostate cancer status post seeding.  7. History of renal calculi.  8. 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  9. Childhood illnesses measles, mumps.   DISCHARGE DIAGNOSES:  1. Osteoarthritis right knee status post right total knee replacement      arthroplasty.  2. Difficulty Foley catheter insertion secondary to bladder neck      contracture status post dilatation and Foley placement.  3. Hypertension.  4. Coronary arterial disease.  5. History of MI (myocardial infarction) 1993.  6. History of renal cancer requiring initial partial nephrectomy and      then complete left nephrectomy.  7. Prostate cancer status post seeding.  8. History of renal calculi.  9. 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  10.Childhood illnesses measles, mumps.  11.Postop hyponatremia improved.   PROCEDURE:  The patient was taken to the OR on July 06, 2009, underwent  a right total knee.  Surgeon Dr. Lequita Halt.  Assistant Alexzandrew L.  Perkins, P.A.C.  Anesthesia spinal with Duramorph.  Also at time of  surgery the patient had bladder neck contracture dilatation and  placement of Foley catheter performed by Vic Blackbird, M.D.   HOSPITAL CONSULTATIONS:  Urology, Courtney Paris, M.D.   BRIEF HISTORY:  The patient is a 77 year old male with end-stage  arthritis right knee,  progressive worsening pain and dysfunction,  successful left total knee and now presents for right total knee.   LABORATORY DATA:  Preop CBC showed a hemoglobin of 13.8, hematocrit  39.9, white cell count 7.0, platelets 142, PT/INR 13.7 and 1.0, PTT of  24.  Chem panel on admission had mildly elevated creatinine preop on  1.6.  Remaining chem panel within normal limits.  Preop UA was negative.  Serial CBCs followed.  Hemoglobin dropped down to 11.4 then 10.7, last  noted H and H 10.5 and 30.3.  Serial pro-times followed, last noted  PT/INR 27.0 and 2.5.  Serial BMETs were followed.  Sodium did drop down  to 133 and came back to 135.  Creatinine dropped to 1.31 and went up to  1.5 and last noted at 1.4.  Remaining electrolytes remained within  normal limits.  Two view chest May 02, 2009, cardiomegaly without  pulmonary edema.  EKG May 03, 2009, sinus bradycardia, incomplete right  bundle branch block, left ventricular hypertrophy, repolarization  abnormalities since last tracing, rate slower and T-wave changes are new  confirmed by Dr. Caprice Kluver.   HOSPITAL COURSE:  The patient was admitted to [facility] and  taken to the OR and underwent the above stated procedure, had difficulty  Foley catheter placement so urology was called in the procedure was done  had dilatation of his bladder neck contracture and placement of Foley  catheter.  He tolerated the procedures well, later transferred to  recovery room and then orthopedic floor and transferred to recovery  room, given 24 hours postop IV antibiotics, started back on his home  medications, started back on his blood pressure medications on  parameters.  We left his Foley catheter in due to the difficulty of  Foley placement and wanted to make sure he was ambulatory.  It was  recommended by Dr. Patsi Sears he follow back up from his urology  standpoint.  He had a preop creatinine a little bit elevated at 1.6 but  on day 1 it was  1.4 or essentially 1.39.  Sodium was down a little bit,  felt to be a dilutional component, did have a little positive volume,  was able to diurese.  By day 2 dressing changed, incision looked good,  wound looked excellent.  He was walking about 14 feet.  We left the  Foley catheter in a little bit more and wanted to make sure he was up  and moving around very well.  By day 3 the catheter was removed but he  still had not met goals.  He was slowly progressing with PT.  Hemoglobin  was stable.  Sodium had dropped initially to 133 and was back up to 134,  improving.  We discontinued the Foley on day 3 for of voiding trial.  He  was able to void on his own and diuresing well.  By day 4 he was doing  much better with his therapy, progressing well and meeting his goals and  discharged home.   DISCHARGE PLAN:  1. Discharged home on July 10, 2009.  2. Discharge diagnoses - please see above.  3. Discharge meds - Coumadin, Robaxin, Ultram.  4. Follow up in 2 weeks.  5. Activity - total knee protocol weightbearing as tolerated at home      with PT home nursing.   DISPOSITION:  Home.   CONDITION ON DISCHARGE:  Improved.      Alexzandrew L. Perkins, P.A.C.      Ollen Gross, M.D.  Electronically Signed    ALP/MEDQ  D:  07/10/2009  T:  07/10/2009  Job:  [id number]   cc:   Nanetta Batty, M.D.  Fax: [phone]   Courtney Paris, M.D.  Fax: [phone]   Sigmund I. Patsi Sears, M.D.  Fax: [phone]

## 2011-04-19 NOTE — Consult Note (Signed)
NAME:  Donald Todd, PURSLEY NO.:  [id number]   MEDICAL RECORD NO.:  [id number]          PATIENT TYPE:  INP   LOCATION:  4703                         FACILITY:  [facility]   PHYSICIAN:  Petra Kuba, M.D.    DATE OF BIRTH:  1929/12/01   DATE OF CONSULTATION:  05/02/2009  DATE OF DISCHARGE:                                 CONSULTATION   HISTORY:  The patient seen at the request of Dr. Nanetta Batty for  abnormal liver tests and abdominal pain.  He is currently doing much  better.  He was admitted in March with lower abdominal pain and fever,  but he says that pain was different.  He was never told that he had  gallstones on the CT that was done in March.  Thursday after eating  hotdog, he had some upper abdominal pain, took some Tums and Rolaids and  felt better; however was woken up from his sleep this morning with  recurrent pain, which was significant.  He had some nausea, but could  not vomit.  No fever.  Came to the emergency room.  Liver tests were  elevated.  He thought maybe he had been told that it had been elevated  before, but he is not sure of that.  His wife is not aware of that.  He  did have a CT, which showed some mild pancreatitis and gallstones, but  no comment on the CBD.  His amylase came back at 3300 and his liver  tests have increased since admission, but he is currently doing better  and wants to eat.   PAST MEDICAL HISTORY:  Pertinent for coronary artery disease with a  stent.  He also has hypertension, increased cholesterol, chronic renal  insufficiency, status post nephrectomy for renal cell cancer, history of  prostate cancer and seed implants.  He also has arthritis and a  questionable mild history of pancreatitis in March.   FAMILY HISTORY:  Negative for any obvious GI problems.   SOCIAL HISTORY:  Does not smoke or drink.   ALLERGIES:  IV contrast dye and adhesive tape.   CURRENT MEDICINES AT HOME:  Prevacid, Toprol, Imdur, Norvasc,  enalapril,  aspirin, fish oil, Ocuvite, Vytorin, Vesicare, and Os-Cal.   REVIEW OF SYSTEMS:  Negative except above.  He has had colon polyps  removed and 2 uneventful colonoscopies, but never an upper test.   PHYSICAL EXAMINATION:  No acute distress.  Lying comfortably in the bed.  Abdomen is soft, nontender.  Cannot duplicate the pain.  No pedal edema.   LABORATORY DATA:  Labs reviewed.  See above.  Increased amylase.  CT  reviewed.   ASSESSMENT:  1. Multiple medical problems.  2. Gallstone pancreatitis, improved.   PLAN:  Clear liquids okay.  We will follow labs, examine symptoms, but  if he continues to improve, we will proceed first with a surgical  consult for a lap chole and intraoperative cholangiogram and then if the  intraoperative angiogram showed residual stones, then an ERCP; however,  if signs of CBD stone predominate, we would proceed with an ERCP or if  the patient worsens again.  I have discussed risks, benefits, and  methods of those tests with the patient and his wife and we will follow  with you and be on standby to help p.r.n., but probably if liver tests,  decreased amylase, lipase continue to drop, and he continues to be pain  free, we would consult Surgery tomorrow for hopefully lap chole in the  near future.  We did discuss, however, if he was not a surgical  candidate, which I doubted possibly just an ERCP and sphincterotomy to  open up the duct and prevent stones from impacting in the future would  be indicated.   Thank you very much.           ______________________________  Petra Kuba, M.D.     MEM/MEDQ  D:  05/02/2009  T:  05/03/2009  Job:  [id number]   cc:   Nanetta Batty, M.D.  Vikki Ports, M.D.

## 2011-04-19 NOTE — H&P (Signed)
NAME:  Donald Todd, KREIDER NO.:  [id number]   MEDICAL RECORD NO.:  [id number]          PATIENT TYPE:  INP   LOCATION:  NA                           FACILITY:  [facility]   PHYSICIAN:  Ollen Gross, M.D.    DATE OF BIRTH:  12/14/1928   DATE OF ADMISSION:  07/06/2009  DATE OF DISCHARGE:                              HISTORY & PHYSICAL   CHIEF COMPLAINT:  Right knee pain.   HISTORY OF PRESENT ILLNESS:  The patient is a 77 year old male, well-  known to Dr. Ollen Gross, having previously undergone a left total  knee back in March of 2006. Unfortunately the right knee is more  problematic and symptomatic and has progressively gotten worse.  He has  had aspiration and cortisone injections that have only provided  temporary relief.  He has done well with the left knee. Right knee is  now end-stage arthritis and he is felt to be a good candidate.  Risks  and benefits have been discussed. He elects to proceed with the surgery.  He has been seen recently by Dr. Allyson Sabal and felt to be stable for  surgery.   Dr. Hazle Coca note states that the patient had a history of RCA stenting  as well as stenting of his ramus branch back in 1997. He was re-cathed  on December 22, 2003 revealing patent stents with a 60% mid-LAD lesion,  normal LV function of 40%, ostial right renal artery stenosis, with  absent left renal artery stenosis secondary to nephrectomy.  He is felt  to be cleared for surgery.   ALLERGIES:  NO KNOWN DRUG ALLERGIES.  Please note that he does have skin  sensitivity to adhesive tape but he is able use paper tape and he is  able to use  Steri-Strips and he does NOT have a latex allergy, only the  adhesive to really strong tapes.   CURRENT MEDICATIONS:  Amlodipine, enalapril, isosorbide, Prilosec, fish  oil, Metoprolol, Ocuvite, Vesicare, Vytorin, coated aspirin 81 mg.   PAST MEDICAL HISTORY:  Hypertension, coronary arterial disease, history  of myocardial infarction 1993.  He had renal cancer requiring initially  partial nephrectomy and then complete left nephrectomy.  He has had  prostate cancer treated with prostate seeding, history of renal calculi.  Childhood illnesses are measles and mumps. He also has some renal artery  stenosis on the right.   PAST SURGERIES:  Right knee arthroscopy 2002, left partial nephrectomy  2002, prostate seeding 2002, complete left nephrectomy 2005, left total  knee replacement 2006, and recent gallbladder removal in 2010 per Dr.  Johna Sheriff. It was noted in Dr. Hazle Coca note that he had some chest pain  during that hospitalization which resolved.   FAMILY HISTORY:  Father with stroke.  Mother with cancer.  Brother with  stroke.   SOCIAL HISTORY:  Married, quit smoking in 1972. He smoked about 20  years. No alcohol.  Five children. Has 2 steps entering his home. 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   REVIEW OF SYSTEMS:  GENERAL:  No fevers, chills, night sweats.  NEUROLOGIC:  No  seizures, syncope or paralysis.  RESPIRATORY:  No  shortness of breath, productive cough or hemoptysis.  CARDIOVASCULAR:  No chest pain or orthopnea. GASTROINTESTINAL:  No nausea, vomiting,  diarrhea, or constipation. GENITOURINARY:  No dysuria or hematuria or  discharge.  MUSCULOSKELETAL:  Right knee.   PHYSICAL EXAMINATION:  VITAL SIGNS:  Pulse 54, respirations 12, blood  pressure 122/64.  GENERAL:  A 77 year old white male well-nourished, well-developed, no  acute stress.  He is alert and cooperative.  Good historian.  HEENT:  Normocephalic, atraumatic.  Pupils are round and reactive.  EOM's intact.  NECK:  Supple.  CHEST:  Clear.  HEART:  Bradycardic rhythm, pulse 54 with otherwise regular rhythm.  S1-  S2 noted.  No rubs, thrills, palpitations or murmurs are appreciated.  ABDOMEN:  Soft, nontender.  Bowel sounds present.  RECTAL/BREAST/GENITALIA:  Not done. Not pertinent to the present  illness.  EXTREMITIES:  Right knee, no  effusion, varus malalignment deformity.  Range of motion 10 to 115 and marked crepitus is noted.   IMPRESSION:  Osteoarthritis right knee.   PLAN:  The patient admitted to [facility] to undergo  a right total knee replacement arthroplasty.  Surgery will be performed  by Dr. Ollen Gross. His cardiologist is Dr. Nanetta Batty. Dr. Allyson Sabal  will be notified of the room number on admission and will be consulted  if need for any cardiac assistance with this patient in the  postoperative period. It was noted that he did have a little bit of  chest pain following his cholecystectomy recently done by Dr. Johna Sheriff  in June of this year.      Alexzandrew L. Perkins, P.A.C.      Ollen Gross, M.D.  Electronically Signed    ALP/MEDQ  D:  07/05/2009  T:  07/05/2009  Job:  [id number]   cc:   Ollen Gross, M.D.  Fax: [phone]   Nanetta Batty, M.D.  Fax: [phone]

## 2011-04-19 NOTE — Op Note (Signed)
NAME:  COLON, RUETH NO.:  [id number]   MEDICAL RECORD NO.:  [id number]          PATIENT TYPE:  INP   LOCATION:  0003                         FACILITY:  [facility]   PHYSICIAN:  Ollen Gross, M.D.    DATE OF BIRTH:  [birth year]   DATE OF PROCEDURE:  DATE OF DISCHARGE:                               OPERATIVE REPORT   PREOPERATIVE DIAGNOSIS:  Osteoarthritis, right knee.   POSTOPERATIVE DIAGNOSIS:  Osteoarthritis, right knee.   PROCEDURE:  Right total knee arthroplasty.   ASSISTANT:  Avel Peace, P.A.-C.   ANESTHESIA:  Spinal with Duramorph.   ESTIMATED BLOOD LOSS:  Minimal.   DRAINS:  None.   TOURNIQUET TIME:  33 minutes at 300 mmHg.   COMPLICATIONS:  None.   CONDITION:  Stable to recovery room.   CLINICAL NOTE:  Donald Todd is a 77 year old male with end-stage  arthritis of the right knee with progressively worsening pain and  dysfunction.  He has had a previously successful left total knee  arthroplasty and presents now for right total knee arthroplasty.   PROCEDURE IN DETAIL:  After successful administration of spinal  anesthetic, a tourniquet was placed on his right thigh and his right  lower extremity was prepped and draped in the usual sterile fashion.  The extremities was wrapped in an Esmarch, the knee flexed, and the  tourniquet inflated to 300 mmHg.  A midline incision was made with a #10  blade through subcutaneous tissue to the level of the extensor  mechanism.  A fresh blade was used to make a medial parapatellar  arthrotomy.  Soft tissue over the proximal medial tibia was  subperiosteally elevated to the joint line with a knife into the  semimembranosus bursa with a Cobb elevator.  Soft tissue laterally was  elevated with attention being paid to avoid the patellar tendon or the  tibial tubercle.  The patella was subluxed laterally and knee flexed to  90 degrees.  The ACL and PCL were removed.  A drill was used create a  starting hole in the  distal femur and canal was thoroughly irrigated.  A  5-degree right valgus alignment guide was placed, referencing off the  posterior condyles.  Rotation was marked and the block pinned to remove  11 mm off the distal femur.  The level was taken because of preoperative  flexion contracture.  Distal femoral resection was made with an  oscillating saw.  A sizing block was placed and size 3 was most  appropriate.  Rotation was marked at the epicondylar axis.  A size 3  cutting block was placed, and the anterior, posterior, and chamfer cuts  were made.   The tibia was subluxed forward and the menisci were removed.  An  extramedullary tibial alignment guide was placed, referencing proximally  at the medial aspect of the tibial tubercle and distally along the  second metatarsal axis and tibial crest.  The block was pinned to remove  about 2 mm off the more deficient medial side.  Tibial resection was  made with an oscillating saw.  Size 3 was the most appropriate tibial  component and the proximal tibia was prepared with the modular drill and  keel punch for the size 3.  Femoral preparation was completed with the  intercondylar cut.   Size 3 mobile-bearing tibial trial, size 3 posterior stabilized femoral  trial, and a 10-mm posterior stabilized rotating platform insert trial  were placed with the 10 full extensions achieved with excellent varus-  valgus and anterior-posterior balance throughout full range of motion.  The patella was then everted and thickness measured to be 22 mm.  Freehand resection was taken to 12 mm, a 38 template was placed, lug  holes were drilled, and trial patella was placed and it tracked  normally.  Osteophytes were removed off the posterior femur with the  trial in place.  All trials were removed and the cut bone surfaces were  prepared with pulsatile lavage.  Cement was mixed and once ready for  implantation, the size 3 mobile-bearing tibial tray, size 3  posterior  stabilized femur, and 38 patella were cemented into place, and the  patella was held with a clamp.  Trial 10-mm inserts were placed, the  knee held in full extension, and all extruded cement removed.  When the  cement was fully hardened, then the permanent 10-mm posterior stabilized  rotating platform insert was placed in the tibial tray.  The wound was  copiously irrigated with saline solution and then FloSeal injected on  the medial and lateral gutters and the suprapatellar area.  A moist  sponge was placed and the tourniquet released for a total time of 33  minutes.  The sponge was held for 2 minutes and then removed.  Minimal  bleeding was encountered.  The bleeding that was encountered was stopped  with electrocautery.  The wound was again irrigated and the arthrotomy  closed with interrupted 1-PDS.  Flexion against gravity has been 135  degrees.  The subcu was closed with interrupted 2-0 Vicryl and  subcuticular running 4-0 Monocryl.  The incision was cleaned and dried,  and Steri-Strips and a bulky sterile dressing were applied.  He was then  placed into a knee immobilizer, awakened, and transported to recovery in  stable condition.      Ollen Gross, M.D.  Electronically Signed     FA/MEDQ  D:  07/06/2009  T:  07/06/2009  Job:  [id number]

## 2011-04-19 NOTE — Consult Note (Signed)
NAME:  Donald Todd, Donald Todd NO.:  [id number]   MEDICAL RECORD NO.:  [id number]          PATIENT TYPE:  INP   LOCATION:  4703                         FACILITY:  [facility]   PHYSICIAN:  Gabrielle Dare. Janee Morn, M.D.DATE OF BIRTH:  13-Oct-1929   DATE OF CONSULTATION:  05/07/2009  DATE OF DISCHARGE:                                 CONSULTATION   CHIEF COMPLAINT:  Biliary pancreatitis.   HISTORY OF PRESENT ILLNESS:  I was asked to evaluate this pleasant 77-  year-old gentleman in regards to biliary pancreatitis by Dr. Vida Rigger  from GI.  The patient was admitted to Cardiology Service yesterday with  abdominal pain workup and GI evaluation, consistent with biliary  pancreatitis.  CAT scan of the abdomen and pelvis demonstrates  gallstones and pancreatitis.  His pain has resolved and initially  amylase and lipase were quite elevated but are improving.  Liver  function tests are also elevated but trending down.  The patient is  tolerating clear liquids.   PAST MEDICAL HISTORY:  Coronary artery disease, hypertension, chronic  renal insufficiency, osteoarthritis, prostate cancer, and left renal  cancer.   PAST SURGICAL HISTORY:  Left partial nephrectomy followed by a left  completion nephrectomy for cancer.   SOCIAL HISTORY:  He does not smoke or drink alcohol.  He is retired from  the [employer].   ALLERGIES:  IV DYE and TAPE.   REVIEW OF SYSTEMS:  GI:  Complaints as above.  CARDIAC:  No current  chest pain.  PULMONARY:  Negative.  MUSCULOSKELETAL:  Negative.  GU:  Negative except for the history as above.  The remainder of the review  of systems was unremarkable today.   PHYSICAL EXAMINATION:  VITAL SIGNS:  Temperature 97.4, blood pressure  119/61, heart rate 58, and respirations 18.  GENERAL:  He is awake and alert.  HEENT:  Face is symmetric and nontender.  Pupils are equal and reactive.  Oral mucosa is moist.  He wears glasses.  LUNGS:  Clear to auscultation with no  wheezing.  Good respiratory effort  is noted.  CARDIOVASCULAR:  Heart is regular with no murmurs.  Impulses palpable in  the left chest.  ABDOMEN:  Soft.  There is no tenderness.  No masses are present.  Bowel  sounds are active.  EXTREMITIES:  Warm.  No deformity.  SKIN:  Warm and dry.   LABORATORY STUDIES:  Amylase 848, lipase on 2009/05/07, was 3,540,  sodium 142, potassium 4, chloride 108, CO2 27, BUN 16, creatinine 1.6,  glucose 103, white blood cell count 5.8, hemoglobin 12.8, AST 159, ALT  235, alkaline phosphatase 183, and bilirubin 1.8.   IMPRESSION:  Biliary pancreatitis.   PLAN:  Continue clear liquids.  We will plan laparoscopic  cholecystectomy with intraoperative cholangiogram.  Once his  pancreatitis resolves, we will plan to check these labs in the a.m.  Plan was discussed in detail with the patient.  I spoke with Dr. Ewing Schlein  from [facility].      Gabrielle Dare Janee Morn, M.D.  Electronically Signed     BET/MEDQ  D:  05/03/2009  T:  05/03/2009  Job:  [id number]   cc:   Petra Kuba, M.D.

## 2011-04-22 NOTE — H&P (Signed)
NAME:  Donald Todd, Donald Todd NO.:  [id number]   MEDICAL RECORD NO.:  [id number]                   PATIENT TYPE:  INP   LOCATION:  3737                                 FACILITY:  [facility]   PHYSICIAN:  Nanetta Batty, M.D.                DATE OF BIRTH:  1929-05-28   DATE OF ADMISSION:  12/22/2003  DATE OF DISCHARGE:  12/23/2003                                HISTORY & PHYSICAL   CHIEF COMPLAINT:  Chest pain.   HISTORY OF PRESENT ILLNESS:  Donald Todd is a 77 year old male with a history  of coronary disease.  He had a Palmaz-Schatz stent to his RCA and ramus  intermedius in 1997.  He is admitted to the emergency room today with  complaints of chest pain described as pressure over the last few days.  Patient says this morning that he had a near-syncopal episode after getting  up to the bathroom.  In the emergency room, he is painfree.   PAST MEDICAL HISTORY:  Remarkable for hyperlipidemia.  He has hypertension.  He has a history of prostate CA and had seed implant and radiation therapy  in 2002 by Dr. Patsi Sears.  He also says he has a tumor on his left kidney  in 2002.  He has gastroesophageal reflux disease.   CURRENT MEDICATIONS:  1. Lisinopril/HCTZ 20/12.5 1/2 q.d.  2. Aspirin q.d.  3. Toprol XL 50 mg a day.  4. Prevacid 30 mg a day.  5. Zocor 40 mg a day.  6. Imdur 30 mg a day.   ALLERGIES:  No known drug allergies.   SOCIAL HISTORY:  He is married.  He has five children and six grandchildren.  He does not smoke.   FAMILY HISTORY:  Father died of a stroke at 69.  Mother died at 15 of  cancer.  He has two brothers and a sister.  There is no coronary disease in  their history.   REVIEW OF SYSTEMS:  Remarkable only for fatigue for the last three days.  He  denies any melena or a history of GI bleeding.   PHYSICAL EXAMINATION:  VITAL SIGNS:  Blood pressure 148/88, pulse 80,  respirations 12.  GENERAL:  He is a well-developed and well-nourished male  in no acute  distress.  HEENT:  Normocephalic.  Extraocular movements are intact.  Sclerae are  anicteric.  NECK:  Left JVD without bruit.  LUNGS:  Clear to auscultation and percussion.  HEART:  Regular rate and rhythm without murmur, rub or gallop.  Normal S1  and S2.  ABDOMEN:  Nontender.  No hepatosplenomegaly.  EXTREMITIES:  Without edema.  Distal pulses are 2+/4.  There are no femoral  bruits noted.  NEUROLOGIC:  Grossly intact.   LABS:  White count 6.1, hemoglobin 14.6, hematocrit 42.9, platelets 160.  Sodium 139, potassium 4.5, BUN 17, creatinine 1.5.  Troponin is negative in  the ER.   EKG  shows sinus rhythm without acute changes.   IMPRESSION:  1. Unstable angina.  2. Coronary disease with previous right coronary artery and ramus     intermedius stent in 1997.  3. Treated hypertension.  4. Hyperlipidemia.  5. Mild renal insufficiency.  6. History of prostate cancer.   PLAN:  Patient was seen by Dr. Allyson Sabal and myself in the emergency room.  He  was started on IV heparin and nitrates and set up for diagnostic  catheterization.      Abelino Derrick, P.A.                      Nanetta Batty, M.D.    Lenard Lance  D:  12/23/2003  T:  12/23/2003  Job:  [id number]   cc:   Al Decant. Janey Greaser, MD  [address]  [place]  [place] [address]  Fax: [phone]

## 2011-04-22 NOTE — Discharge Summary (Signed)
NAME:  Donald Todd, Donald Todd NO.:  [id number]   MEDICAL RECORD NO.:  [id number]          PATIENT TYPE:  INP   LOCATION:  0367                         [facility]   PHYSICIAN:  Sigmund I. Patsi Sears, M.D.DATE OF BIRTH:  09/06/1929   DATE OF ADMISSION:  08/30/2004  DATE OF DISCHARGE:  09/03/2004                                 DISCHARGE SUMMARY   DISCHARGE DIAGNOSIS:  Left renal cell carcinoma.   HISTORY OF PRESENT ILLNESS:  Donald Todd is a 77 year old male who is status  post left partial nephrectomy for a 4-cm renal cell carcinoma in June 2003.  He had a T1 lesion with negative margins. The patient has done well, but he  complained of some left flank muscle weakness. He was noted to have a CT of  the abdomen that showed a suspicious mass in the medial left kidney. He was  recommended for MRI evaluation. MRI was accomplished and showed a large  recurrent nodal mass of tumor in the left medial area in the left  perinephric space with fairly consistent with vascular recruitment. There  was no evidence of metastatic disease of the liver or bony structures, and  the adrenal gland was normal. The patient had extensive conservation with  Dr. Patsi Sears and his wife regarding treatment plan and Dr. Patsi Sears felt  that removal of the remainder of the left kidney was imminent.   HOSPITAL COURSE:  The patient was taken to the operating room where a left  radical nephrectomy and placement of a Marcaine pump was accomplished. The  patient did well postoperatively with very little pain. The patient was able  to ambulate in the hall without difficulty and when the Foley catheter was  removed he had no problems with ambulation. Initial postoperative chest x-  ray showed some questionable pneumonia, but follow-up chest x-rays were  negative for any significant pneumonia. The patient was able to have bowel  movements and eat without difficulty. On postoperative day four, Marcaine  pump was discontinued and the patient had successfully had several bowel  movements. The patient was discharged to home.   DISCHARGE MEDICATIONS:  1.  Percocet.  2.  Cipro.  3.  Neosporin ointment to areas where he had some tape burns on his abdomen.   CONDITION ON DISCHARGE:  Stable.   PLAN:  The patient is to follow up with Terri Piedra, nurse practitioner, in  the office in approximately 10 days for staple removal. He will continue to  follow up with Dr. Patsi Sears in the office for continued follow-up.  Pathology was reviewed with the patient and wife by Dr. Patsi Sears, and at  some point he will be consulted by oncology.      HB/MEDQ  D:  09/15/2004  T:  09/15/2004  Job:  [id number]

## 2011-04-22 NOTE — H&P (Signed)
NAME:  Donald Todd, BAIL NO.:  [id number]   MEDICAL RECORD NO.:  [id number]          PATIENT TYPE:  INP   LOCATION:  NA                           FACILITY:  [facility]   PHYSICIAN:  Ollen Gross, M.D.    DATE OF BIRTH:  Jun 14, 1929   DATE OF ADMISSION:  02/21/2005  DATE OF DISCHARGE:                                HISTORY & PHYSICAL   The patient has had left knee pain for the past several months.  He has had  increasing pain over the past few weeks.  He has had cortisone injections in  the past with minimal relief.  He is having increasing pain, which is  limiting his activities, and he elects to proceed with a left total knee  arthroplasty.   ALLERGIES:  Adhesive tape causes a rash.   PAST MEDICAL HISTORY:  1.  The patient had an MI in 1993.  2.  Stent placement in 1997.  3.  Prostate cancer with seed implants.  4.  Kidney cancer.   CURRENT MEDICATIONS:  1.  He is on Toprol XL 50 mg daily.  2.  Prevacid 30 mg daily.  3.  Isosorbide 3 mg daily.  4.  Norvasc 5 mg daily.  5.  Aspirin 325 mg daily.  6.  Vytorin 40 mg daily.   PREVIOUS SURGICAL HISTORY:  1.  The patient had right knee arthroscopy in 2002.  2.  Partial removal of left kidney in 2002.  3.  Prostate seed implants in 2002.  4.  Removal of left kidney in September 2005.  5.  Stent placement in his heart in 1997.   FAMILY HISTORY:  Mother had cancer.  Father and brother had stroke.   REVIEW OF SYSTEMS:  GENERAL:  Denies weight change, fever, chills or  fatigue.  HEENT:  Denies headache, visual changes, tinnitus, hearing loss or  sore throat.  CARDIOVASCULAR:  Denies chest pain, palpitations, shortness of  breath or orthopnea.  PULMONARY:  Denies dyspnea, wheezing, cough, sputum  production or hemoptysis.  GI:  Denies dysphagia, nausea, vomiting,  hematemesis or abdominal pain.  GU:  Denies dysuria, urgency or frequency.  ENDOCRINE:  Denies polyuria, polydipsia, appetite change or heat or cold  intolerance.  MUSCULOSKELETAL:  Left knee pain.  NEUROLOGIC:  Denies  dizziness, vertigo, syncope or seizure.  SKIN:  Denies itching, rashes,  masses or moles.   PHYSICAL EXAMINATION:  VITAL SIGNS:  Temperature 97.8, pulse 58,  respirations 18, blood pressure 142/80.  GENERAL:  A 77 year old male in no acute distress.  HEENT:  PERRL.  EOMs intact.  Pharynx clear.  NECK:  Supple without masses.  CHEST:  Clear to auscultation bilaterally.  No wheezing, rales or rhonchi  noted.  HEART:  Regular rate and rhythm without murmur.  ABDOMEN:  Positive bowel sounds.  Soft and nontender.  EXTREMITIES:  Examination of his left knee reveals painful range of motion.  He does have crepitus with range of motion.  SKIN:  Warm and dry.   X-RAY:  Of his left knee shows bone on bone, medial and patellofemoral  compartments, with large  osteophytes.   IMPRESSION:  End-stage osteoarthritis, left knee.   PLAN:  The patient is being admitted to [facility] for a left  total knee arthroplasty on February 21, 2005, with Dr. Lequita Halt.      LKP/MEDQ  D:  02/17/2005  T:  02/17/2005  Job:  [id number]

## 2011-04-22 NOTE — Cardiovascular Report (Signed)
NAME:  Donald Todd, KOTHARI NO.:  [id number]   MEDICAL RECORD NO.:  [id number]                   PATIENT TYPE:  INP   LOCATION:  3737                                 FACILITY:  [facility]   PHYSICIAN:  Nanetta Batty, M.D.                DATE OF BIRTH:  07-19-1929   DATE OF PROCEDURE:  12/22/2003  DATE OF DISCHARGE:                              CARDIAC CATHETERIZATION   INDICATIONS:  Mr. Marcella is a 77 year old gentleman with history of coronary  artery disease status post RCA and ramus branch stenting in 1997.  His last  catheterization performed by Dr. Jenne Campus June 19, 1997 showed patent stents.  His other problems include hypertension, hyperlipidemia.  He has noticed  substernal chest pressure over the last three days which has been waxing and  waning.  He presented to the emergency room today with those complaints. His  EKG showed no acute changes and his enzymes are negative.  He was placed on  IV nitro and comes to lab now for selective coronary angiography.   PROCEDURE DESCRIPTION:  The patient was brought to the second floor [facility] in the postabsorptive state.  He was  premedicated with p.o. Valium and received IV labetalol at the end of the  case for hypertension prior to sheath pull.  His right groin was prepped and  shaved in the usual sterile fashion.  1% Xylocaine was used for local  anesthesia.  A 6 French sheath was inserted into the right femoral artery  using standard Seldinger technique. A 6 French sheath right and left Judkins  diagnostic catheter as well as a 6 French pigtail catheter were used for  selective coronary angiography, left ventriculography and distal abdominal  aortography.  Omnipaque dye was used for the entirety of the case.  Retrograde aorta, ventricular pullback pressures were recorded.   HEMODYNAMICS:  1. Aortic systolic pressure 194, diastolic pressure 106.  2. Left ventricular systolic  pressure 194, end-diastolic pressure 13.   SELECTIVE CORONARY ANGIOGRAPHY:  1. Left main normal.  2. LAD:  The LAD has 60% segmental stenosis in its mid portion at the take     off of the second small diagonal branch.  3. Ramus intermedius:  Large distally bifurcating vessel with a patent stent     in its proximal portion.  4. Left circumflex:  Nondominant and free of significant disease.  5. Right coronary artery:  Large dominant vessel with widely patent stent in     its proximal portion.   LEFT VENTRICULOGRAPHY:  RAO left ventriculogram was performed using 20 mL of  Omnipaque dye at 10 mL per second.  The overall LVEF estimated at  approximately 50% without focal wall motion abnormalities.   DISTAL ABDOMINAL AORTOGRAPHY:  Distal abdominal aortogram was performed  using 20 mL of Omnipaque dye at 20 mL per second.  There was at most a 40%  right  renal artery stenosis.  The infrarenal abdominal aorta and iliac  bifurcation appeared free of significant atherosclerotic changes.   IMPRESSION:  Mr. Heckendorn has widely patent stents with noncritical coronary  artery disease and normal LV function.  I believe his chest pain is  noncardiac.  He will be treated anti-reflux measures and sent home in the  morning with continued medical therapy.   An ACT was measured and the sheaths were removed.  Pressure was held on the  groin to achieve hemostasis.  The patient left the lab in stable condition.                                               Nanetta Batty, M.D.    JB/MEDQ  D:  12/22/2003  T:  12/23/2003  Job:  [id number]   cc:   [facility]  [address] [address]  [place], [place]   Al Decant. Janey Greaser, MD  [address]  [place]  [place] [address]  Fax: [phone]

## 2011-04-22 NOTE — Op Note (Signed)
[place]  Patient:    Donald Todd, Donald Todd                       MRN: [id number] Proc. Date: 12/28/00 Adm. Date:  [id number] Disc. Date: [id number] Attending:  Maryanna Shape                           Operative Report  PREOPERATIVE DIAGNOSES:  Right knee osteoarthritis and degenerative tear of the medial meniscus.  POSTOPERATIVE DIAGNOSES: 1. Grade 3 and grade 4 chondromalacia, medial greater than lateral. 2. Anterior flap tear of the medial meniscus. 3. Superior plica.  OPERATION: 1. Arthroscopy of right knee. 2. Resection of flap tear, anterior horn, medial meniscus. 3. Resection of superior plica, followed by very mild light debridement    of this chondromalacia and lavage.  SURGEON:  Reynolds Bowl, M.D.  DESCRIPTION OF PROCEDURE:  The patient was given a block and sedation.  The right lower extremity was prepped from the toes to the high thigh, and draped in the usual fashion.  This included the use of waterproof lower leg draping. The anteromedial and anterolateral portals were established.  An examination of the knee disclosed a moderate amount of debris floating about in the suprapatellar area.  Some grade 2 or 3 chondromalacia changes of the patella and femoral trochlea.  Moderately-large thick superomedial band appeared to e a plica.  A ragged flap tear of the anterior horn of the medial meniscus. Some areas that appeared to expose subchondral bone in the medial femoral condyle, and others were grade 3 chondromalacia.  The cruciate ligament area was all covered with hemosiderin-stained synovium, and enough of this was removed to identify the anterior cruciate being intact. Then laterally there were at least grade 2 changes of chondromalacia.  The leading edge of the lateral meniscus had degenerative changes, but there was no tear.  Medially there was no tear of the medial meniscus, and no tear of the medial meniscus  posteriorly.  Using a rotary meniscotome then, I resected the flap of the anterior meniscus, did light debridement of the chondromalacia of the medial femoral condyle, resected the plica, and then spent time lavaging the knee.  The instruments were removed.  The portals were reapproximated with #5-0 nylon, and a small dressing.  The knee was injected just before the dressing with about 15 cc of Marcaine with epinephrine.  INSTRUCTIONS TO PATIENT:  To do frequent quad sets, ankle pumps, walk with a straight knee, take Vicodin for pain.  To call with concerns.  Otherwise see me in the office as planned. DD:  12/28/00 TD:  12/28/00 Job: [id number]

## 2011-04-22 NOTE — Op Note (Signed)
[facility]  Patient:    Donald Todd, Donald Todd Visit Number: [id number] MRN: [id number]          Service Type: DSU Location: DAY Attending Physician:  Lurene Shadow. Date: 09/27/01 Admit Date:  09/27/2001   CC:         Wynn Banker, M.D.   Operative Report  PREOPERATIVE DIAGNOSIS:  Adenocarcinoma of the prostate.  POSTOPERATIVE DIAGNOSIS:  Adenocarcinoma of the prostate.  OPERATION:  Implantation of palladium-103 seed therapy.  SURGEON:  Sigmund I. Patsi Sears, M.D.  RADIATION THERAPIST:  Wynn Banker, M.D.  PREPARATION:  After appropriate preanesthesia, the patient is brought to the operating room and placed upon the operating table in the dorsal supine position where a general endotracheal anesthesia was introduced.  He was then re-placed in the low Allen stirrup dorsal lithotomy position where the pubis was prepped with Betadine solution and draped in the usual fashion.  REVIEW OF HISTORY:  This 77 year old male was status post partial nephrectomy in June 2002, and was noted to have a prostate nodule on physical examination, with a PSA of 3.36, a prostate volume of 34 cc, and a density of .49. Prostate biopsy showed Gleason 4+3 adenocarcinoma of the prostate, and with no evidence of metastatic disease.  He is now for palladium seem implantation.  DESCRIPTION OF PROCEDURE:  With the patient in lithotomy position, stabilizing needles were placed under fluoroscopic control.  The patient then underwent 20 needle implantations, with a total of 82 palladium-103 seeds, 1.2 mCi per seed.  He tolerated the procedure well.  At the end of the case, cystoscopy was accomplished and found a clot in the bladder which was removed.  Those seeds were within the clot.  Additional erythema was noted of the prostate fossa, but no seeds were identified in the prostate.  The prostate itself appeared to be subtotally occlusive.  The veru was  intact.  The patient was awakened and taken to the recovery room in excellent condition.  A Foley catheter was left in place. Attending Physician:  Laqueta Jean DD:  09/27/01 TD:  09/28/01 Job: [id number]

## 2011-04-22 NOTE — Discharge Summary (Signed)
[facility]  Patient:    Donald Todd, Donald Todd                     MRN: [id number] Adm. Date:  [id number] Disc. Date: [id number] Attending:  Laqueta Jean CC:         Al Decant. Janey Greaser, M.D., [facility]   Discharge Summary  FINAL DIAGNOSIS:  Renal cell carcinoma, grade 3/4, 4 cm without capsular penetration and with negative margins.  OPERATIONS/PROCEDURES PERFORMED:  Most important operation took place on May 03, 2001.  That operation was partial nephrectomy.  HISTORY OF PRESENT ILLNESS:  Donald Todd is a 77 year old married white male, originally seen May 2002 with a prostate nodule, status post biopsy showing adenocarcinoma of the prostate, with a PSA of 3.36, volume of 34 cc, density of 0.49.  During the patients evaluation for prostate cancer, abdominal/pelvic CT was accomplished, and he was found to have a 5-6 cm mass in the left kidney, with central necrosis.  It was felt to be a possible angiomyolipoma, but renal cell carcinoma could not be ruled out because of the size.  The patient now presents for partial nephrectomy.  He has no gross hematuria, no history of microhematuria, normal chest x-ray, and normal bone scan.  PAST MEDICAL HISTORY: 1. Adenocarcinoma of the prostate. 2. Coronary artery disease with a history of MI. 3. Hypertension. 4. History of kidney stones.  ALLERGIES:  IV contrast.  Intolerances:  TYLOX.  PAST SURGICAL HISTORY: 1. Cardiac stent implant x 2. 2. Knee surgery in January 2002.  SOCIAL HISTORY:  Tobacco:  None in 40 years.  Alcohol:  None.  The patient is married, with five children including three sons and two daughters and twins.  FAMILY HISTORY: 1. No history of cancer of the prostate or kidney cancer. 2. The patients mother had cancer of the stomach. 3. The patients brother and father had CVA. 4. Family history positive for heart disease, hypertension, and kidney  stones.  ADMISSION MEDICATIONS: 1. Toprol XL 50 mg one q.d. 2. Prinzide 12/12.5 mg one-half tablet q.d. 3. Aspirin 325 mg q.d. (on hold). 4. Glucosamine two q.d. 5. Zocor 40 mg q.d. 6. NitroQuick 0.4 mg p.r.n.  PHYSICAL EXAMINATION:  Physical examination is as noted on H&P of May 03, 2001.  LABORATORY AND X-RAY DATA:  Admission laboratories show urinalysis which is dipstick negative.  Serum sodium 140, potassium 4.4, chloride 104, CO2 30, BUN is 19, creatinine 1.3.  Random glucose 127.  Liver functions are normal. White blood cell count 6000, hemoglobin 14.1, hematocrit 41.  HOSPITAL COURSE:  The day of admission, the patient underwent a partial nephrectomy with the finding of a renal cell carcinoma, grade 3/4, with negative margins, negative capsule, and negative lymph nodes.  The patient dropped his hemoglobin to 7.8 postop and received two units of packed red blood cells.  He was followed in the intensive care unit for one night, then moved to a private room on [address]  He did well, has had two bowel movements, is eating regular diet, and ready for discharge on May 08, 2001.  The patient had Marcaine pump in place, this was removed today.  DISCHARGE MEDICATIONS:  Discharged on Lorcet Plus as well as his normal home medications.  DISCHARGE FOLLOWUP:  He will return on Thursday to get staple removal and in three weeks for follow-up.  He will eventually need treatment for his carcinoma of the prostate.  CONDITION AT DISCHARGE:  Stable. DD:  05/08/01 TD:  05/09/01 Job: [id number]

## 2011-04-22 NOTE — Discharge Summary (Signed)
NAME:  DARSHAN, SOLANKI NO.:  [id number]   MEDICAL RECORD NO.:  [id number]                   PATIENT TYPE:  INP   LOCATION:  3737                                 FACILITY:  [facility]   PHYSICIAN:  Nanetta Batty, M.D.                DATE OF BIRTH:  [birth year]   DATE OF ADMISSION:  12/22/2003  DATE OF DISCHARGE:  12/23/2003                                 DISCHARGE SUMMARY   DISCHARGE DIAGNOSES:  1. Unstable angina, catheterization this admission revealing patent right     coronary artery and ramus intermedius stents with a 60% left anterior     descending lesion.  2. Coronary disease with ramus intermedius and right coronary artery Palmar-     Schatz stent in 1997.  3. Treated hypertension.  4. Treated hyperlipidemia.  5. Mild renal insufficiency, creatinine 1.7 at discharge.  6. History of prostate cancer.   HOSPITAL COURSE:  The patient is a 77 year old male followed by Dr. Allyson Sabal  and Dr. Janey Greaser with a history of coronary disease as noted above.  He  presented to the emergency room with chest pain consistent with unstable  angina.  He had no EKG changes.  His troponins were negative.  He was  started on IV heparin and nitrates and taken to the catheterization lab.  Catheterization revealed a patent RCA stent, patent ramus intermedius stent,  normal circumflex, and a 60% mid LAD narrowing with normal LV function. He  did have a mild 40% right renal artery narrowing. The patient tolerated the  procedure well.  Post procedure on December 23, 2003, his creatinine did bump  to 1.7.  We have held his HCTZ and Lisinopril, this was also held pre  catheterization.  We did bolus him with fluids on the morning of January 18,  and he will be discharged late on January 18 with plans to follow up a BNP  in about 48 hours.  He has had no recurrent chest pain.   DISCHARGE MEDICATIONS:  1. Toprol XL 50 mg a day.  2. Norvasc 5 mg a day has been added.  3.  Enteric-coated aspirin once a day.  4. Prevacid 30 mg a day.  5. Zocor 40 mg a day.  6. Isosorbide mononitrate as taken prior to admission.   DISCHARGE LABORATORY DATA:  White count 6.3, hemoglobin 13.2, hematocrit  39.1, platelets 148.  Sodium 140, potassium 4.2, BUN 17, creatinine 1.7.  Troponins are negative.   Chest x-ray is negative for active disease.  EKG shows sinus rhythm without  acute changes.   DISPOSITION:  The patient is discharged in stable condition and will follow  up with Dr. Allyson Sabal on February 1.  He will have a BMP done later this week.  We did stop his Lisinopril, HCTZ, and start Norvasc 5 mg a day.      Abelino Derrick, P.A.  Nanetta Batty, M.D.    Lenard Lance  D:  12/23/2003  T:  12/24/2003  Job:  [id number]   cc:   Al Decant. Janey Greaser, MD  [address]  [place]  [place] [address]  Fax: [phone]

## 2011-04-22 NOTE — Op Note (Signed)
NAME:  Donald Todd, Donald Todd NO.:  [id number]   MEDICAL RECORD NO.:  [id number]          PATIENT TYPE:  INP   LOCATION:  0162                         FACILITY:  [facility]   PHYSICIAN:  Sigmund I. Patsi Sears, M.D.DATE OF BIRTH:  November 14, 1929   DATE OF PROCEDURE:  08/30/2004  DATE OF DISCHARGE:                                 OPERATIVE REPORT   PREOPERATIVE DIAGNOSIS:  Left renal mass.   POSTOPERATIVE DIAGNOSIS:  Left renal mass.   OPERATION PERFORMED:  1.  Left radical nephrectomy.  2.  Placement of Marcaine infusion pump.   SURGEON:  Sigmund I. Patsi Sears, M.D.   RESIDENT SURGEON:  Thyra Breed, MD   ANESTHESIA:  General endotracheal.   ESTIMATED BLOOD LOSS:  1200 mL.   DRAINS:  45 French Foley catheter straight drain.   COMPLICATIONS:  None.   OPERATIVE FINDINGS:  We performed the surgery through a hemichevron incision  on the left.  The posterior plane along the psoas was found to be quite  adherent but appeared to be mostly scar from the patient's previous partial  nephrectomy.  Superiorly the plane developed rather well.  There was some  minimal bleeding involved with the spleen.  This was controlled with the  argon beam and Surgicel.  Medially, at the hilum there appeared to be tumor  thrombus invading the proximal renal vein at the hilum.  However, the renal  vein was ligated well away from the tumor involvement within the vein.  The  remainder of the procedure was carried out without difficulty.  An intact  specimen was removed surrounded with Gerota's fascia including the adrenal  gland.   INDICATIONS FOR PROCEDURE:  Donald Todd is a 77 year old male who is status  post a left partial nephrectomy for a 4 cm renal cell carcinoma in June of  2003.  Specifically, this was a T1 lesion with negative margins.  The  patient has done well from this surgery.  He has also undergone  brachytherapy for adenocarcinoma of the prostate in October of 2002 with a  PSA of  3.36.  The patient does have intermittent gross hematuria.  On follow-  up evaluation in  August of 2005 a CT scan showed a suspicious mass in the  medial aspect of the left kidney.  An MRI was ordered which showed a large  recurrent, possibly nodal mass of tumor in the area of the hilum with fairly  extensive vascular recruitment.  Bone scan was negative and there appeared  to be no other evidence of metastatic spread.  The inferior vena cava  appeared to be free of tumor thrombus.  Given the patient's recurrence in  this left kidney of likely renal cell carcinoma it is indicated to perform a  left radical nephrectomy at this time.  The patient understands the risks,  benefits and alternatives of the procedure including bleeding, infection,  damage to adjacent structures, failure of the procedure, need for future  reoperation, future renal problems and the risks of anesthesia.  Informed  consent was obtained.   DESCRIPTION OF PROCEDURE:  Following identification by his arm bracelet,  the  patient was brought to the operating room and placed in the supine position.  He then underwent successful induction of general endotracheal anesthesia  and received preoperative intravenous antibiotics in the form of Zosyn.  A  bump was then placed beneath the patient's pelvis and his flank was  positioned over the table break.  His entire lower abdomen to above the  umbilicus, his entire abdomen was then shaved, prepped with Betadine and  draped in the usual sterile fashion.  A Vi-drape was utilized.  We created a  hemichevron incision extending from the xiphoid process two fingerbreadths  below the subcostal margin and over the top of the 11th rib.  Bovie  electrocautery was used to carry the incision down through the corresponding  fascia and muscle layers to allow an anatomic closure at the end of the  case.  The table was flexed slightly.  We then used a right angle clamp and  Metzenbaum dissection  to reflect the colon medially along the white line of  Toldt.  Once the colon was adequately reflected, we looked superiorly.  The  self-retaining Omnitract retractor was utilized at this point.  Superiorly  some bleeding was noted as the splenic attachments to the omentum had been  pulled slightly.  Specifically, this was some capsular bleeding from the  superior aspect of the spleen.  The argon beam was then used to coagulate  the bleeding.  There was some minimal venous oozing which continued.  A  piece of Surgicel was placed in this area and the spleen was packed from the  field.  Of note, at the end of the case, there was no evidence of further  bleeding from the spleen.  We then used Bovie electrocautery to further  develop our plane over lying Gerota's fascia superior laterally overlying  the kidney.  Using blunt dissection and the right angle, we were then able  to enter our plane surrounding the kidney.  Medially, a hard mass could be  felt consistent with that seen on the CT scan and MRI.  Posteriorly, the  kidney was quite stuck likely as a result from the previous partial  nephrectomy through the flank incision.  At this time we began to work  inferiorly.  Using blunt dissection and Bovie electrocautery we began to  carefully dissect through the fat layers inferiorly to the kidney.  Of note,  there were numerous parasitic vessels penetrating the renal hilum and  through Gerota's fascia.  As these vessels were encountered, they were  doubly ligated using large Hemalock clips and divided.  We continued our  dissection inferomedially until the ureter was encountered.  The ureter was  freed.  Two large Hemalock clips were used and the ureter was divided.  We  continued to move medially towards the hilum until the palpable mass was  encountered.  In fact, upon freeing the overlying fat, this was extension of tumor into the proximal part of the renal vein exiting the hilum.  We   continued to use careful Metzenbaum dissection until the renal vein was  cleared of any remaining attachments.  A 0 silk suture was then used to  ligate the renal vein well away from the tumor thrombus.  We placed two more  0 silk sutures to triply ligate the vein and a large Hemalock clip was  placed proximally to prevent any tumor spillage, Metzenbaum scissors were  then used to divide the renal vein.  This allowed excellent exposure of  the  renal artery directly posterior.  The renal artery was then freed.  It was  doubly ligated with 0 silk suture and two large Hemalock clips.  We  continued superomedially and encountered one final accessory renal vein.  This was triply ligated using 0 silk suture.  The adrenal gland was then  seen.  This left adrenal given its proximity to the tumor mass was taken as  a part of the radical specimen.  The adrenal vein was encountered and doubly  ligated using 0 silk suture.  We then moved superiorly and carefully removed  any remaining attachments between this spleen and the left kidney.  This  left the posterior plane as well as the lateral attachments remaining.  Hemostasis was excellent at this point.  The dissection was quite difficult  posteriorly and took some time.  This was handled again ligating any  parasitic veins.  We were right along the psoas muscles sometimes taking  small muscle fibers as a part of the radical specimen in case there was  tumor involvement posteriorly.  The lateral attachments were then taken  using right angle dissection and Bovie electrocautery.  The specimen was  then delivered from the field for permanent analysis.  The surgical bed was  then copiously irrigated.  Some chylous leakage was noted from near the  inferior vena cava where the renal vein had been ligated.  We carefully  searched this area until we found the lymphatic leak.  A small Hemalock clip  was placed and the milky fluid ceased.  Bovie electrocautery  was used for  further coagulate any bleeding.  A piece of Surgicel was placed around the  hilar dissection.  Hemostasis was excellent at this point.  The surrounding  bowel was then evaluated and there was no evidence of injury.  The  nasogastric tube was palpated within the fundus of the stomach.  The  retractor was then removed.  The table removed out of flex.  The incision  was closed in an anatomic fashion in three layers using #1 running PDS  suture.  Two Marcaine infusion catheters were tunneled subcutaneously into  the incision.  The incision was infiltrated with 0.5% Marcaine prior to  closure.  The skin was closed with surgical clips.  The Marcaine infusion  catheter was connected for postoperative pain management.  The incision was  cleaned and dried and sterile dressings applied.  All sponge, needle and  instrument counts were correct times two at this point.  The patient tolerated the procedure well.  There were no complications.  Please note  that Dr. Jethro Bolus was present and participated in the entire  procedure as he was the responsible surgeon.   DISPOSITION:  After awaking from general anesthesia, the patient was  transferred to the post anesthesia care unit in stable condition.  From here  he will be transferred to the step down intensive care unit for overnight  observation and probable transfer to the floor.      ________________________________________  Thyra Breed, MD  ___________________________________________Sigmund I. Patsi Sears, M.D.    EG/MEDQ  D:  08/30/2004  T:  08/31/2004  Job:  [id number]

## 2011-04-22 NOTE — Discharge Summary (Signed)
NAME:  Donald Todd, Donald Todd NO.:  [id number]   MEDICAL RECORD NO.:  [id number]          PATIENT TYPE:  INP   LOCATION:  0457                         FACILITY:  [facility]   PHYSICIAN:  Ollen Gross, M.D.    DATE OF BIRTH:  10-30-1929   DATE OF ADMISSION:  02/21/2005  DATE OF DISCHARGE:                                 DISCHARGE SUMMARY   ADMITTING DIAGNOSES:  1.  Osteoarthritis left knee.  2.  History of myocardial infarction 1993.  3.  History of prostate cancer with seed implants.  4.  Hypertension.  5.  Coronary arterial disease.  6.  Status post nephrectomy.  7.  History of renal calculi.   DISCHARGE DIAGNOSES:  1.  Osteoarthritis left knee status post left total knee arthroplasty.  2.  History of myocardial infarction 1993.  3.  History of prostate cancer with seed implants.  4.  Hypertension.  5.  Coronary arterial disease.  6.  Status post nephrectomy.  7.  History of renal calculi.   PROCEDURE:  Date of surgery February 21, 2005:  Left total knee arthroplasty.  Surgeon:  Dr. Lequita Halt. Assistant:  Avel Peace, P.A.-C. Spinal anesthesia.  Blood loss minimal. Tourniquet time 45 minutes.   CONSULTS:  None.   BRIEF HISTORY:  The patient is a 77 year old male with severe end-stage  arthritis of the left knee with intractable pain, has failed nonoperative  management, and now presents for a total knee arthroplasty.   LABORATORY DATA:  CBC preoperatively:  Hemoglobin 14.1, hematocrit of 41.0,  white cell count 6.4, differential normal. Postoperative hemoglobin 12.7.  Last noted H&H 11.2 and 32.8. PT/PTT preoperatively 12.3 and 27 respectively  and INR 0.9. Serial protimes followed. Last noted PT/INR 25.2 and 3.3. Chem  panel on admission:  Elevated creatinine 1.9, elevated glucose of 121,  elevated bili of 1.3; remaining chem panel within normal limits. Serial  BMETs were followed. Sodium did drop down to 131, was back up to 137.  Glucose went up to 249, back down  to 143. Creatinine remained stable at 1.9.  Urinalysis negative. Blood group/type O negative. He has a chest x-ray dated  September 01, 2004:  Removal of NG tube, left lower lobe atelectasis,  consolidation, stable mild interstitial edema. He has an EKG dated August 25, 2004:  Normal sinus rhythm, left anterior fascicular block, nonspecific  T wave abnormality; no significant change since last tracing - confirmed by  Dr. Elmore Guise.   HOSPITAL COURSE:  The patient admitted to [facility], taken to the  OR, underwent the above procedure, tolerated well. Started on PCA and p.o.  analgesics. Did fairly well with his pain control. Hemovac drain pulled on  postoperative day #1. He started getting up with physical therapy. Dressing  changed on postoperative day #2, incision looking well. Tolerating  medications. Got rid of his PCA and IV fluids and his Foley was out.  Continued to progress well with physical therapy. He was already up  ambulating about 40 feet on postoperative day #2, and then 80 feet on the  afternoon of postoperative day #2.  He did well and was set up to go home on  postoperative day #3 - February 24, 2005 - pending therapy session.   DISCHARGE MEDICATIONS AND PLAN:  1.  The patient discharged home on February 24, 2005 pending therapy session.  2.  Discharge diagnoses:  Please see above.  3.  Discharge medications:  Percocet, Coumadin, and Robaxin; resume home      medications.  4.  Diet:  Cardiac diet.  5.  Activity:  Weightbearing as tolerated. Gait training ambulation, ADLs,      as per home health PT, home health nursing to the left leg. May start      showering.  6.  Follow up 2 weeks from surgery.   DISPOSITION:  Home.   CONDITION UPON DISCHARGE:  Improved.      ALP/MEDQ  D:  02/24/2005  T:  02/24/2005  Job:  [id number]

## 2011-04-22 NOTE — Op Note (Signed)
NAME:  Donald Todd, Donald Todd NO.:  [id number]   MEDICAL RECORD NO.:  [id number]          PATIENT TYPE:  INP   LOCATION:  0009                         FACILITY:  [facility]   PHYSICIAN:  Ollen Gross, M.D.    DATE OF BIRTH:  1929/09/06   DATE OF PROCEDURE:  02/21/2005  DATE OF DISCHARGE:                                 OPERATIVE REPORT   PREOPERATIVE DIAGNOSIS:  Osteoarthritis, left knee.   POSTOPERATIVE DIAGNOSIS:  Osteoarthritis, left knee.   PROCEDURE:  Left total knee arthroplasty.   SURGEON:  Ollen Gross, M.D.   ASSISTANT:  Alexzandrew L. Julien Girt, P.A.   ANESTHESIA:  Spinal.   ESTIMATED BLOOD LOSS:  Minimal.   DRAIN:  Hemovac x1.   TOURNIQUET TIME:  45 minutes at 300 mmHg.   COMPLICATIONS:  None.   CONDITION:  Stable to recovery.   BRIEF CLINICAL NOTE:  Donald Todd is a 77 year old male with severe end-stage  osteoarthritis of the left knee with intractable pain.  He has failed  nonoperative management and presents now for left total knee arthroplasty.   PROCEDURE IN DETAIL:  After successful administration of spinal anesthetic,  a tourniquet was placed high on his left thigh, and the left lower extremity  prepped and draped in the usual sterile fashion.  The extremity was wrapped  in an Esmarch, the knee flexed, and the tourniquet inflated to 300 mmHg.  A  standard midline incision was made with a 10 blade through the subcutaneous  tissue to the level of the extensor mechanism.  A free blade was used to  make a medial parapatellar arthrotomy.  Then the soft tissue over the  proximal medial tibia was subperiosteally elevated at the joint line with a  knife and into semimembranosus bursa with a Cobb elevator.  Soft tissue over  the proximal lateral tibia was also elevated with attention being paid to  avoiding the patellar tendon on the tibial tubercle.  The patella was  everted, the knee flexed to 90 degrees, and the ACL and PCL removed.  A  drill was  used to create a starting hole in the distal femur.  The canal was  irrigated.  The 5-degree left valgus alignment guide was placed and a site  at the posterior condyle rotation was marked with a black pen to remove 10  mm off the distal femur.  The distal femoral resection was made with an  oscillating saw.  The sizing block was placed, and the size 3 was the most  appropriate.  Rotation was marked off the epicondylar axis.  Then the size 3  cutting block was placed.  The anterior, posterior, and chamfer cuts were  subsequently made.   The tibia was subluxed forward and the menisci were removed.  Extramedullary  tibial alignment guide was placed referencing proximally at the medial  aspect of the tibial tubercle and distally along the second metatarsal axis  and tibial crest.  The block is pinned to remove 10 mm of the non-deficient  lateral side.  This did not get to the base of the medial defect, and I took  an additional 2 mm to get down to the base of that.  A size 3 was then most  appropriate tibial component, and the proximal tibia was prepared with a  Modular drill and keel punch for a size 3.  Femoral preparation was  completed with the intercondylar cut.   A size 3 mobile bearing tibial trial with a size 3 posterior-stabilized  femoral trial and a 10-mm posterior-stabilizing rotating platform tibial  trial were placed.  With the 10, full extension then achieved with excellent  varus and valgus balance throughout the full range of motion.  His deformity  was corrected with excellent alignment.  The patella was then everted and  thickness measured to be 21 mm.  Free-hand resection was taken to 12 mm.  A  38 template was placed.  Holes were drilled.  Trial patella was placed, and  it tracks normally.  The osteophytes were then removed off the posterior  femur with the trial in place.  All trials were removed, and the cut bone  surfaces were prepared with pulsatile lavage.  Cement  was mixed. Once ready  for implantation, the size 3 mobile baring tibial tray, size 3 posterior-  stabilized femur, and 38 patella were cemented into place, and the patella  was held with a clamp.  Trial 10-mm inserts were placed with the knee held  in full extension, and all extruded cement was removed.  Once the cement was  fully hardened, then a permanent 10-mm posterior-stabilized rotating  platform insert was placed into the tibial tray.  The wound was copiously  irrigated with saline solution and the extensor mechanism closed over a  Hemovac drain with interrupted #1 PDS.  Flexion against gravity was 135  degrees.  The tourniquet was inflated for a total time of 45 minutes.  The  subcutaneous tissue was closed with interrupted 2-0 Vicryl.  The  subcuticular was closed with running 4-0 Vicryl.  The incision was cleaned  and dried, and Steri-Strips and a bulky sterile dressing were applied.  The  drain was hooked to suction, and a knee immobilizer was placed.  He was  awakened and transported to recovery in stable condition.      FA/MEDQ  D:  02/21/2005  T:  02/21/2005  Job:  [id number]

## 2011-07-20 ENCOUNTER — Encounter (INDEPENDENT_AMBULATORY_CARE_PROVIDER_SITE_OTHER): Payer: Self-pay | Admitting: General Surgery

## 2011-07-20 ENCOUNTER — Ambulatory Visit (INDEPENDENT_AMBULATORY_CARE_PROVIDER_SITE_OTHER): Payer: Medicare Other | Admitting: General Surgery

## 2011-07-20 VITALS — BP 134/68 | HR 64 | Temp 98.3°F | Ht 67.75 in | Wt 176.2 lb

## 2011-07-20 DIAGNOSIS — K4090 Unilateral inguinal hernia, without obstruction or gangrene, not specified as recurrent: Secondary | ICD-10-CM

## 2011-07-20 NOTE — Progress Notes (Signed)
Subjective:   Pain and bulge right groin  Patient ID: Donald Todd., male   DOB: 1929-01-09, 77 y.o.   MRN: [id number]  HPI Patient is an 78 year old male here through the courtesy of Dr. Tenny Craw. He states that for about 2 weeks he has had an intermittent pain and bulge in his right groin. This is related to activity. He also has some discomfort in his left groin but has not noted a bulge. He has not had any nausea or vomiting. No previous hernia repairs.  Review of Systems  Constitutional: Negative for fever and unexpected weight change.  HENT: Negative.   Eyes: Negative.   Respiratory: Negative.   Cardiovascular: Negative.   Gastrointestinal: Positive for constipation.       Colonoscopy up to date  Genitourinary: Negative.   Hematological: Negative.        Objective:   Physical Exam General: Well-developed in no acute distress Skin: Warm and dry without rash or infection Lymph nodes: No cervical supraclavicular or inguinal nodes palpable HEENT: Sclera nonicteric. Pupils equal and reactive. Oropharynx clear. Lungs: Clear without wheezing or increased work of breathing Cardiovascular: Regular rate and rhythm. No murmur. No JVD or edema. Abdomen: Large healed left upper quadrant incision. No masses or tenderness. With the patient standing and straining there is a small to moderate sized reducible right inguinal hernia. I cannot feel any hernia on the left. Extremities: No joint swelling or edema Neurologic: Alert and oriented. Gait normal.    Assessment:     Symptomatic right inguinal hernia    Plan:    I discussed options for repair with the patient i.e. laparoscopic and open repair. We discussed that he has had radiation for his prostate which could produce some scarring that could interfere with the laparoscopic repair but I think this is not likely. He would prefer laparoscopic repair. We discussed the indications for the surgery, risks of anesthetic complications,  bleeding, infection, recurrence, and possible need for open repair. He will need cardiology clearance and we will get this scheduled for him.

## 2011-07-20 NOTE — Patient Instructions (Signed)
Inguinal Hernia, Adult Muscles help keep everything in the body in its proper place. But if a weak spot in the muscles develops, something can poke through. That is called a hernia. When this happens in the lower part of the belly (abdomen), it is called an inguinal hernia. (It takes its name from a part of the body in this region called the inguinal canal.) A weak spot in the wall of muscles lets some fat or part of the small intestine bulge through. An inguinal hernia can develop at any age. Men get them more often than women. CAUSES In adults, an inguinal hernia develops over time.  It can be triggered by:   Suddenly straining the muscles of the lower abdomen.   Lifting heavy objects.   Straining to have a bowel movement. Difficult bowel movements (constipation) can lead to this.   Constant coughing. This may be caused by smoking or lung disease.   Being overweight.   Being pregnant.   Working at a job that requires long periods of standing or heavy lifting.   Having had an inguinal hernia before.  One type can be an emergency situation. It is called a strangulated inguinal hernia. It develops if part of the small intestine slips through the weak spot and cannot get back into the abdomen. The blood supply can be cut off. If that happens, part of the intestine may die. This situation requires emergency surgery. SYMPTOMS Often, a small inguinal hernia has no symptoms. It is found when a healthcare provider does a physical exam. Larger hernias usually have symptoms.   In adults, symptoms may include:   A lump in the groin. This is easier to see when the person is standing. It might disappear when lying down.   In men, a lump in the scrotum.   Pain or burning in the groin. This occurs especially when lifting, straining or coughing.   A dull ache or feeling of pressure in the groin.   Signs of a strangulated hernia can include:   A bulge in the groin that becomes very painful and  tender to the touch.   A bulge that turns red or purple.   Fever, nausea and vomiting.   Inability to have a bowel movement or to pass gas.  DIAGNOSIS To decide if you have an inguinal hernia, a healthcare provider will probably do a physical examination.  This will include asking questions about any symptoms you have noticed.   The healthcare provider might feel the groin area and ask you to cough. If an inguinal hernia is felt, the healthcare provider may try to slide it back into the abdomen.   Usually no other tests are needed.  TREATMENT Treatments can vary. The size of the hernia makes a difference. Options include:  Watchful waiting. This is often suggested if the hernia is small and you have had no symptoms.   No medical procedure will be done unless symptoms develop.   You will need to watch closely for symptoms. If any occur, contact your healthcare provider right away.   Surgery. This is used if the hernia is larger or you have symptoms.   Open surgery. This is usually an outpatient procedure (you will not stay overnight in a hospital). An cut (incision) is made through the skin in the groin. The hernia is put back inside the abdomen. The weak area in the muscles is then repaired by:  --Herniorrhaphy. In this type of surgery, the weak muscles are sewn   back together. --Hernioplasty. A patch or mesh is used to close the weak area in the abdominal wall.   Laparoscopy. In this procedure, a surgeon makes small incisions. A thin tube with a tiny video camera (called a laparoscope) is put into the abdomen. The surgeon repairs the hernia with mesh by looking with the video camera and using two long instruments.  HOME CARE INSTRUCTIONS  After surgery to repair an inguinal hernia:   You will need to take pain medicine prescribed by your healthcare provider. Follow all directions carefully.   You will need to take care of the wound from the incision.   Your activity will be  restricted for awhile. This will probably include no heavy lifting for several weeks. You also should not do anything too active for a few weeks. When you can return to work will depend on the type of job that you have.   During "watchful waiting" periods, you should:   Maintain a healthy weight.   Eat a diet high in fiber (fruits, vegetables and whole grains).   Drink plenty of fluids to avoid constipation. This means drinking enough water and other liquids to keep your urine clear or pale yellow.   Do not lift heavy objects.   Do not stand for long periods of time.   Quit smoking. This should keep you from developing a frequent cough.  SEEK MEDICAL CARE IF:  A bulge develops in your groin area.   You feel pain, a burning sensation or pressure in the groin. This might be worse if you are lifting or straining.   You develop a fever of more than 100.5F (38.1 C).  SEEK IMMEDIATE MEDICAL CARE IF:  Pain in the groin increases suddenly.   A bulge in the groin gets bigger suddenly and does not go down.   For men, there is sudden pain in the scrotum. Or, the size of the scrotum increases.   A bulge in the groin area becomes red or purple and is painful to touch.   You have nausea or vomiting that does not go away.   You feel your heart beating much faster than normal.   You cannot have a bowel movement or pass gas.   You develop a fever of more than 102.0F (38.9C).  Document Released: 04/09/2009 Document Re-Released: 05/11/2010 Exit[employer] Patient Information 2011 [employer]. 

## 2011-08-01 ENCOUNTER — Ambulatory Visit (HOSPITAL_COMMUNITY): Payer: Medicare Other

## 2011-08-01 ENCOUNTER — Ambulatory Visit (HOSPITAL_COMMUNITY)
Admission: RE | Admit: 2011-08-01 | Discharge: 2011-08-01 | Disposition: A | Discharge status: Home / Self Care | Payer: Medicare Other | Source: Ambulatory Visit | Attending: General Surgery | Admitting: General Surgery

## 2011-08-01 DIAGNOSIS — I10 Essential (primary) hypertension: Secondary | ICD-10-CM | POA: Insufficient documentation

## 2011-08-01 DIAGNOSIS — K4090 Unilateral inguinal hernia, without obstruction or gangrene, not specified as recurrent: Secondary | ICD-10-CM

## 2011-08-01 DIAGNOSIS — I2510 Atherosclerotic heart disease of native coronary artery without angina pectoris: Secondary | ICD-10-CM | POA: Insufficient documentation

## 2011-08-01 DIAGNOSIS — I252 Old myocardial infarction: Secondary | ICD-10-CM | POA: Insufficient documentation

## 2011-08-01 HISTORY — PX: HERNIA REPAIR: SHX51

## 2011-08-01 LAB — CBC
MCV: 89.6 fL (ref 78.0–100.0)
Platelets: 168 10*3/uL (ref 150–400)
RBC: 4.73 MIL/uL (ref 4.22–5.81)
RDW: 13 % (ref 11.5–15.5)
WBC: 6.9 10*3/uL (ref 4.0–10.5)

## 2011-08-01 LAB — BASIC METABOLIC PANEL
CO2: 25 mEq/L (ref 19–32)
Chloride: 101 mEq/L (ref 96–112)
GFR calc Af Amer: 46 mL/min — ABNORMAL LOW (ref >60)
Potassium: 4.8 mEq/L (ref 3.5–5.1)
Sodium: 136 mEq/L (ref 135–145)

## 2011-08-01 LAB — SURGICAL PCR SCREEN: Staphylococcus aureus: NEGATIVE

## 2011-08-02 NOTE — Op Note (Signed)
NAME:  Donald Todd, Donald Todd NO.:  [id number]  MEDICAL RECORD NO.:  [id number]  LOCATION:  DAYL                         FACILITY:  [facility]  PHYSICIAN:  Sharlet Salina T. Hena Ewalt, M.D.DATE OF BIRTH:  April 03, 1929  DATE OF PROCEDURE:  08/01/2011 DATE OF DISCHARGE:  08/01/2011                              OPERATIVE REPORT   PREOPERATIVE DIAGNOSIS:  Right inguinal hernia.  POSTOPERATIVE DIAGNOSIS:  Right inguinal hernia.  SURGICAL PROCEDURE:  Laparoscopic repair of right inguinal hernia.  SURGEON:  Lorne Skeens. Rayana Geurin, MD  ANESTHESIA:  General.  BRIEF HISTORY:  Donald Todd is an 77 year old male who presents with recent onset of a symptomatic right inguinal hernia.  He has a moderate reducible hernia on exam.  We discussed options for repair and have elected to proceed with laparoscopic repair.  The nature of procedure, its indications, risks of anesthetic complications, bleeding, infection, chronic pain, and possible need for open procedure were discussed and understood.  He is now brought to the operating room for this procedure.  DESCRIPTION OF OPERATION:  The patient brought to the operating room, placed in supine position on the operating table, and general endotracheal anesthesia was induced.  He was carefully positioned with his arms tucked, Foley catheter placed, and the abdomen was widely sterilely prepped and draped.  He received preoperative IV antibiotics. Correct patient and procedure were verified.  I made a 1-1/2 cm incision at the umbilicus, dissection was carried down to the anterior fascia. This was incised just to the right of the midline transversely for a centimeter and the medial edge of the right rectus muscle retracted laterally and the preperitoneal space entered under direct vision.  The balloon dissector tip was passed into this space and then advanced along the midline to the pubic symphysis.  Under direct laparoscopic vision, the balloon was  inflated with very good deployment of the balloon in the preperitoneal space.  It was left in place for hemostasis for several minutes, then the balloon was deflated, and removed and the balloon trocar inflated and CO2 pressure applied.  Video laparoscopy showed good dissection of the preperitoneal space.  Immediately apparent was an obvious direct hernia with preperitoneal fat, extending up through the direct inguinal space.  The pubic symphysis identified and cleared and the pubic ramus cleared down until it was obscured by the direct hernia. I then went out laterally and the peritoneum was taken down off the abdominal wall, dissecting it posteriorly out to the level of the umbilicus at the anterior iliac spine.  The peritoneal edge was then followed back medially, dissecting it posteriorly.  The cord structures were identified and there was no indirect hernia present.  Peritoneum was dissected well back posteriorly off the cord structures.  The epigastric vessels were identified and protected.  The herniated preperitoneal fat was then carefully dissected posteriorly off the pseudo sac and then completely dissected posteriorly with a very discrete moderate-sized direct defect completely dissected circumferentially and cleared down to the Florence ligament posteriorly down to the iliac vessels which were identified and protected.  After this complete dissection, a large right-sided Bard 3DMax piece of mesh was chosen, introduced in the preperitoneal space, and oriented.  It  was initially tacked at its medial corner to the pubic symphysis with some overlap.  It was then tacked down along Cooper ligament with about 1/2 cm overlap below it, tacking it down to, but avoiding the iliac vessels. The mesh extended out laterally toward the superior iliac spine. Beginning on the superior edge of the mesh laterally where I could feel all the tacks through the abdominal wall.  The superior edge was  tacked working back medially avoiding the epigastric vessels and then finally the medial edge of the mesh secured with tacks.  This provided nice broad coverage over the direct and indirect spaces.  The operative site inspected for hemostasis which appeared complete.  No problems were noted.  All CO2 was then evacuated and trocars removed.  The fascial defect of the umbilicus was closed with a figure-of-eight suture of 0 Vicryl.  Skin incisions were closed with subcuticular Monocryl and Dermabond.  Sponge, needle, and instrument counts were correct.  The patient taken to recovery room in good condition.     Lorne Skeens. Saunders Arlington, M.D.     Tory Emerald  D:  08/01/2011  T:  08/01/2011  Job:  [id number]  Electronically Signed by Glenna Fellows M.D. on 08/02/2011 07:49:17 AM

## 2011-08-29 LAB — CBC
HCT: 36.3 — ABNORMAL LOW
Hemoglobin: 11.4 — ABNORMAL LOW
Hemoglobin: 12.5 — ABNORMAL LOW
MCHC: 34.5
MCV: 87.5
Platelets: 167
RBC: 3.75 — ABNORMAL LOW
RBC: 4.15 — ABNORMAL LOW
RDW: 14.1
WBC: 10.8 — ABNORMAL HIGH
WBC: 9.2

## 2011-08-29 LAB — LIPASE, BLOOD
Lipase: 27
Lipase: 30

## 2011-08-29 LAB — COMPREHENSIVE METABOLIC PANEL
ALT: 60 — ABNORMAL HIGH
ALT: 79 — ABNORMAL HIGH
AST: 24
AST: 29
Albumin: 2.6 — ABNORMAL LOW
CO2: 23
Calcium: 8.5
Chloride: 103
Creatinine, Ser: 1.53 — ABNORMAL HIGH
Creatinine, Ser: 1.69 — ABNORMAL HIGH
GFR calc Af Amer: 48 — ABNORMAL LOW
GFR calc Af Amer: 54 — ABNORMAL LOW
GFR calc non Af Amer: 40 — ABNORMAL LOW
GFR calc non Af Amer: 44 — ABNORMAL LOW
Glucose, Bld: 107 — ABNORMAL HIGH
Sodium: 137
Total Bilirubin: 1.5 — ABNORMAL HIGH
Total Protein: 6.2

## 2011-08-29 LAB — URINE MICROSCOPIC-ADD ON

## 2011-08-29 LAB — URINALYSIS, ROUTINE W REFLEX MICROSCOPIC
Bilirubin Urine: NEGATIVE
Glucose, UA: NEGATIVE
Nitrite: NEGATIVE
pH: 6

## 2011-08-29 LAB — DIFFERENTIAL
Eosinophils Absolute: 0
Eosinophils Relative: 0
Lymphocytes Relative: 7 — ABNORMAL LOW
Lymphs Abs: 0.7
Monocytes Absolute: 0.5
Monocytes Relative: 5

## 2011-09-01 ENCOUNTER — Ambulatory Visit (INDEPENDENT_AMBULATORY_CARE_PROVIDER_SITE_OTHER): Payer: Medicare Other | Admitting: General Surgery

## 2011-09-01 ENCOUNTER — Encounter (INDEPENDENT_AMBULATORY_CARE_PROVIDER_SITE_OTHER): Payer: Self-pay | Admitting: General Surgery

## 2011-09-01 VITALS — BP 132/74 | HR 56 | Temp 98.4°F | Resp 16 | Ht 66.0 in | Wt 172.4 lb

## 2011-09-01 DIAGNOSIS — Z9889 Other specified postprocedural states: Secondary | ICD-10-CM

## 2011-09-01 NOTE — Progress Notes (Signed)
Patient returns 4 weeks following her laparoscopic repair of his right inguinal hernia. He had some expected discomfort for a couple of weeks but at this point feels well without complaint.  On examination repair feels solid with no evidence of recurrence or other complication and his wounds are well healed.  Assessment and plan: Doing well following laparoscopic repair of his right inguinal hernia without complication. He is at full activity. He'll return as needed.

## 2011-09-01 NOTE — Patient Instructions (Signed)
Call as needed for any concerns or questions

## 2011-12-13 DIAGNOSIS — H524 Presbyopia: Secondary | ICD-10-CM | POA: Diagnosis not present

## 2011-12-13 DIAGNOSIS — H353190 Nonexudative age-related macular degeneration, unspecified eye, stage unspecified: Secondary | ICD-10-CM | POA: Diagnosis not present

## 2011-12-13 DIAGNOSIS — H2510 Age-related nuclear cataract, unspecified eye: Secondary | ICD-10-CM | POA: Diagnosis not present

## 2011-12-13 DIAGNOSIS — H04129 Dry eye syndrome of unspecified lacrimal gland: Secondary | ICD-10-CM | POA: Diagnosis not present

## 2012-01-13 DIAGNOSIS — H2510 Age-related nuclear cataract, unspecified eye: Secondary | ICD-10-CM | POA: Diagnosis not present

## 2012-01-23 DIAGNOSIS — I10 Essential (primary) hypertension: Secondary | ICD-10-CM | POA: Diagnosis not present

## 2012-01-23 DIAGNOSIS — I701 Atherosclerosis of renal artery: Secondary | ICD-10-CM | POA: Diagnosis not present

## 2012-02-10 DIAGNOSIS — R109 Unspecified abdominal pain: Secondary | ICD-10-CM | POA: Diagnosis not present

## 2012-02-20 DIAGNOSIS — H2510 Age-related nuclear cataract, unspecified eye: Secondary | ICD-10-CM | POA: Diagnosis not present

## 2012-02-20 DIAGNOSIS — H269 Unspecified cataract: Secondary | ICD-10-CM | POA: Diagnosis not present

## 2012-02-21 DIAGNOSIS — H2510 Age-related nuclear cataract, unspecified eye: Secondary | ICD-10-CM | POA: Diagnosis not present

## 2012-03-12 DIAGNOSIS — H2510 Age-related nuclear cataract, unspecified eye: Secondary | ICD-10-CM | POA: Diagnosis not present

## 2012-03-12 DIAGNOSIS — H269 Unspecified cataract: Secondary | ICD-10-CM | POA: Diagnosis not present

## 2012-04-17 DIAGNOSIS — H353190 Nonexudative age-related macular degeneration, unspecified eye, stage unspecified: Secondary | ICD-10-CM | POA: Diagnosis not present

## 2012-04-17 DIAGNOSIS — H353290 Exudative age-related macular degeneration, unspecified eye, stage unspecified: Secondary | ICD-10-CM | POA: Diagnosis not present

## 2012-04-17 DIAGNOSIS — H35359 Cystoid macular degeneration, unspecified eye: Secondary | ICD-10-CM | POA: Diagnosis not present

## 2012-04-23 DIAGNOSIS — I10 Essential (primary) hypertension: Secondary | ICD-10-CM | POA: Diagnosis not present

## 2012-04-23 DIAGNOSIS — E782 Mixed hyperlipidemia: Secondary | ICD-10-CM | POA: Diagnosis not present

## 2012-04-23 DIAGNOSIS — I2510 Atherosclerotic heart disease of native coronary artery without angina pectoris: Secondary | ICD-10-CM | POA: Diagnosis not present

## 2012-04-25 DIAGNOSIS — E782 Mixed hyperlipidemia: Secondary | ICD-10-CM | POA: Diagnosis not present

## 2012-04-25 DIAGNOSIS — Z79899 Other long term (current) drug therapy: Secondary | ICD-10-CM | POA: Diagnosis not present

## 2012-07-17 DIAGNOSIS — R310 Gross hematuria: Secondary | ICD-10-CM | POA: Diagnosis not present

## 2012-07-17 DIAGNOSIS — C649 Malignant neoplasm of unspecified kidney, except renal pelvis: Secondary | ICD-10-CM | POA: Diagnosis not present

## 2012-07-17 DIAGNOSIS — E559 Vitamin D deficiency, unspecified: Secondary | ICD-10-CM | POA: Diagnosis not present

## 2012-07-17 DIAGNOSIS — C61 Malignant neoplasm of prostate: Secondary | ICD-10-CM | POA: Diagnosis not present

## 2012-07-24 ENCOUNTER — Ambulatory Visit (HOSPITAL_COMMUNITY)
Admission: RE | Admit: 2012-07-24 | Discharge: 2012-07-24 | Disposition: A | Discharge status: Home / Self Care | Payer: Medicare Other | Source: Ambulatory Visit | Attending: Urology | Admitting: Urology

## 2012-07-24 ENCOUNTER — Other Ambulatory Visit (HOSPITAL_COMMUNITY): Payer: Self-pay | Admitting: Urology

## 2012-07-24 DIAGNOSIS — C649 Malignant neoplasm of unspecified kidney, except renal pelvis: Secondary | ICD-10-CM | POA: Diagnosis not present

## 2012-07-24 DIAGNOSIS — R310 Gross hematuria: Secondary | ICD-10-CM | POA: Diagnosis not present

## 2012-07-24 DIAGNOSIS — C61 Malignant neoplasm of prostate: Secondary | ICD-10-CM | POA: Diagnosis not present

## 2012-07-24 DIAGNOSIS — Z87891 Personal history of nicotine dependence: Secondary | ICD-10-CM | POA: Insufficient documentation

## 2012-07-24 DIAGNOSIS — N289 Disorder of kidney and ureter, unspecified: Secondary | ICD-10-CM | POA: Diagnosis not present

## 2012-08-08 DIAGNOSIS — C649 Malignant neoplasm of unspecified kidney, except renal pelvis: Secondary | ICD-10-CM | POA: Diagnosis not present

## 2012-08-08 DIAGNOSIS — C61 Malignant neoplasm of prostate: Secondary | ICD-10-CM | POA: Diagnosis not present

## 2012-08-08 DIAGNOSIS — N289 Disorder of kidney and ureter, unspecified: Secondary | ICD-10-CM | POA: Diagnosis not present

## 2012-08-08 DIAGNOSIS — R310 Gross hematuria: Secondary | ICD-10-CM | POA: Diagnosis not present

## 2012-10-26 DIAGNOSIS — E782 Mixed hyperlipidemia: Secondary | ICD-10-CM | POA: Diagnosis not present

## 2012-10-26 DIAGNOSIS — I2510 Atherosclerotic heart disease of native coronary artery without angina pectoris: Secondary | ICD-10-CM | POA: Diagnosis not present

## 2012-10-26 DIAGNOSIS — I10 Essential (primary) hypertension: Secondary | ICD-10-CM | POA: Diagnosis not present

## 2012-12-13 ENCOUNTER — Other Ambulatory Visit (HOSPITAL_COMMUNITY): Payer: Self-pay | Admitting: Physician Assistant

## 2012-12-13 DIAGNOSIS — I701 Atherosclerosis of renal artery: Secondary | ICD-10-CM

## 2013-01-04 DIAGNOSIS — K4090 Unilateral inguinal hernia, without obstruction or gangrene, not specified as recurrent: Secondary | ICD-10-CM | POA: Diagnosis not present

## 2013-01-04 DIAGNOSIS — M25569 Pain in unspecified knee: Secondary | ICD-10-CM | POA: Diagnosis not present

## 2013-01-04 DIAGNOSIS — R21 Rash and other nonspecific skin eruption: Secondary | ICD-10-CM | POA: Diagnosis not present

## 2013-01-09 ENCOUNTER — Encounter (HOSPITAL_COMMUNITY): Payer: Medicare Other

## 2013-01-11 ENCOUNTER — Encounter: Payer: Self-pay | Admitting: *Deleted

## 2013-01-16 ENCOUNTER — Encounter (HOSPITAL_COMMUNITY): Payer: Medicare Other

## 2013-01-17 ENCOUNTER — Encounter (INDEPENDENT_AMBULATORY_CARE_PROVIDER_SITE_OTHER): Payer: Medicare Other | Admitting: General Surgery

## 2013-01-28 ENCOUNTER — Ambulatory Visit (HOSPITAL_COMMUNITY)
Admission: RE | Admit: 2013-01-28 | Discharge: 2013-01-28 | Disposition: A | Discharge status: Home / Self Care | Payer: Medicare Other | Source: Ambulatory Visit | Attending: Physician Assistant | Admitting: Physician Assistant

## 2013-01-28 DIAGNOSIS — Q619 Cystic kidney disease, unspecified: Secondary | ICD-10-CM | POA: Insufficient documentation

## 2013-01-28 DIAGNOSIS — Z905 Acquired absence of kidney: Secondary | ICD-10-CM | POA: Diagnosis not present

## 2013-01-28 DIAGNOSIS — I10 Essential (primary) hypertension: Secondary | ICD-10-CM | POA: Diagnosis not present

## 2013-01-28 DIAGNOSIS — I701 Atherosclerosis of renal artery: Secondary | ICD-10-CM

## 2013-01-28 NOTE — Progress Notes (Signed)
Renal Duplex Completed. Donald Todd D  

## 2013-02-01 ENCOUNTER — Ambulatory Visit (INDEPENDENT_AMBULATORY_CARE_PROVIDER_SITE_OTHER): Payer: Medicare Other | Admitting: General Surgery

## 2013-02-01 ENCOUNTER — Encounter (INDEPENDENT_AMBULATORY_CARE_PROVIDER_SITE_OTHER): Payer: Self-pay | Admitting: General Surgery

## 2013-02-01 VITALS — BP 146/72 | HR 60 | Resp 16 | Ht 68.0 in | Wt 174.0 lb

## 2013-02-01 DIAGNOSIS — K4090 Unilateral inguinal hernia, without obstruction or gangrene, not specified as recurrent: Secondary | ICD-10-CM

## 2013-02-01 NOTE — Patient Instructions (Addendum)
We will call you to schedule your surgery after we check with your cardiologist

## 2013-02-01 NOTE — Progress Notes (Signed)
Subjective:   left groin pain  Patient ID: Donald Todd, male   DOB: 11-19-1929, 79 y.o.   MRN: [id number]  HPI Patient returns to the office. He recently began to have intermittent pain in his left groin. This often occurs when flexing his thighs such as driving and has been enough to make him stop and get out of the car and walk around on occasion. He saw Dr. Tenny Craw who felt that he had a left inguinal hernia. I have performed laparoscopic repair of a right inguinal hernia previously. He has an occasional twinge of mild discomfort in the right groin. No GI symptoms.  Past Medical History  Diagnosis Date  . Hypertension     renal dopplers 218.13-normal patency  . Chronic kidney disease   . Prostate disease     Cancer S/P seed implant  . Arthritis   . Inguinal hernia 08/01/11    right  . Heart attack 1993  . H/O unilateral nephrectomy     lt. nephrectomy  . CAD (coronary artery disease)     last cath. 07-09-2010  . Hyperlipemia   . H/O cardiovascular stress test 08-04-2010    ef 55% NO ISCHEMIA   Past Surgical History  Procedure Laterality Date  . Angioplasty  1993  . Stent implants  1997  . Athroscopic knee surgery  2002    right  . Radiation treatment  2002    prostate  . Colonscopy  2004    with biopsy  . Kidney surgery  2002 and 2005    2002 - partial removal of left. 2005 complete removal  . Bone scan  2005  . Joint replacement  2006 and 2010    left 2006, right 2010  . Cholecystectomy  05/05/2009    Laparoscopic  . Hernia repair  08/01/11    RIH  . Coronary angioplasty with stent placement  03-15-1996    pci to rci and ramus branch using j&j [id number] stent,post dialated  at  20atm (rca)  and 14 atm (ramus branch. ef 55%            . Cardiac catheterization  07-09-2010    normal left ventricular function, coronary obsstructive disease with 20% proximal an 30-40% mid lt. anterior descending narrowing ; widely patent stent in the proximal ramus intermediate vessel; 50-70-%  narrowing in the mid circumflex,and widely patent stent  in the rt. coronary   . Cardiac catheterization  12-22-2003    widely patent stents with noncritical coronary artery disease and normalLV function   Current Outpatient Prescriptions  Medication Sig Dispense Refill  . amLODipine (NORVASC) 2.5 MG tablet Take 2.5 mg by mouth daily. Patient taking 7.5 mg total per dosage       . AMOXICILLIN PO Take 50 mg by mouth as needed. Only before dental appointments       . aspirin 81 MG tablet Take 81 mg by mouth daily.        [name] atorvastatin (LIPITOR) 40 MG tablet Take 40 mg by mouth daily.        . B Complex Vitamins (B-COMPLEX/B-12) TABS Take by mouth every morning.      . enalapril (VASOTEC) 10 MG tablet Take 10 mg by mouth daily.        . ISOSORBIDE PO Take 120 mg by mouth. Patient takes 90 mg in morning, and 30 mg in evening       . metoprolol (TOPROL-XL) 50 MG 24 hr tablet Take 50 mg by mouth  daily.        . Multiple Vitamins-Minerals (OCUVITE PO) Take by mouth daily.        . nitroGLYCERIN (NITROSTAT) 0.4 MG SL tablet Place 0.4 mg under the tongue as needed.        [name] omeprazole (PRILOSEC) 20 MG capsule Take 20 mg by mouth daily.        [name] oxybutynin (DITROPAN) 5 MG tablet Take 5 mg by mouth every morning.       No current facility-administered medications for this visit.   Allergies  Allergen Reactions  . Tape Hives  . Iohexol      Desc: PT DOES RECALL REACTION JUST SAYS IT WAS A LONG TIME AGO FOR KIDNEY X-RAYS AND HE DIDN'T TOLERATE IT WELL-ARS 05/02/09-NOTE E-CHART STATES A HOT FEELING IS HIS REACTION, BUT HE CAN'T CONFIRM THAT WAS ALL      Review of Systems  Constitutional: Negative for fever, appetite change and fatigue.  Respiratory: Negative.   Cardiovascular: Negative.   Gastrointestinal: Negative.   Genitourinary: Negative.        Objective:   Physical Exam BP 146/72  Pulse 60  Resp 16  Ht 5\' 8"  (1.727 m)  Wt 174 lb (78.926 kg)  BMI 26.46 kg/m2 General: Elderly  healthy-appearing Caucasian male looking somewhat younger than his age Skin: no rash or infection HEENT: No masses or thyromegaly. Sclera nonicteric Lungs: Clear equal breath sounds bilaterally Cardiac: Regular rate and rhythm. No edema or JVD Abdomen: Long healed left flank incision with mild diffuse herniation and nontender. I cannot feel any hernia in the right groin and it is nontender. There he is a small to moderate sized left inguinal hernia presenting at the internal ring and reducible and slightly tender. GU: Normal male Extremities: No joint swelling or edema Neurologic: Alert and oriented. Gait normal.    Assessment:     Symptomatic left inguinal hernia. I discussed options with the patient including observation and repair. He is having some significant episodes of pain and wants to have this fixed and I think this is reasonable. As we have approached the right side laparoscopically previously I recommended an open repair on the left due to the scarring that would be present. We discussed the indications for the procedure use of general anesthesia an expected recovery as well as risks of bleeding, infection, recurrence, chronic pain. All questions were answered.    Plan:     Open repair of left inguinal hernia under general anesthesia as an outpatient. We will obtain cardiac clearance preoperatively.

## 2013-02-06 ENCOUNTER — Telehealth (INDEPENDENT_AMBULATORY_CARE_PROVIDER_SITE_OTHER): Payer: Self-pay

## 2013-02-06 NOTE — Telephone Encounter (Signed)
Incoming fax regarding Cardiac Clearance.  Per Dr. Allyson Sabal patient need's stress test before clearing for surgery.

## 2013-02-07 ENCOUNTER — Other Ambulatory Visit (HOSPITAL_COMMUNITY): Payer: Self-pay | Admitting: Cardiovascular Disease

## 2013-02-07 DIAGNOSIS — Z01818 Encounter for other preprocedural examination: Secondary | ICD-10-CM

## 2013-02-15 ENCOUNTER — Ambulatory Visit (HOSPITAL_COMMUNITY)
Admission: RE | Admit: 2013-02-15 | Discharge: 2013-02-15 | Disposition: A | Discharge status: Home / Self Care | Payer: Medicare Other | Source: Ambulatory Visit | Attending: Cardiovascular Disease | Admitting: Cardiovascular Disease

## 2013-02-15 DIAGNOSIS — Z01810 Encounter for preprocedural cardiovascular examination: Secondary | ICD-10-CM | POA: Insufficient documentation

## 2013-02-15 DIAGNOSIS — Z01818 Encounter for other preprocedural examination: Secondary | ICD-10-CM

## 2013-02-15 DIAGNOSIS — Z9861 Coronary angioplasty status: Secondary | ICD-10-CM | POA: Diagnosis not present

## 2013-02-15 DIAGNOSIS — I2510 Atherosclerotic heart disease of native coronary artery without angina pectoris: Secondary | ICD-10-CM | POA: Diagnosis not present

## 2013-02-15 MED ORDER — TECHNETIUM TC 99M SESTAMIBI GENERIC - CARDIOLITE
30.0000 | Freq: Once | INTRAVENOUS | Status: AC | PRN
  Administered 2013-02-15: 30 via INTRAVENOUS

## 2013-02-15 MED ORDER — REGADENOSON 0.4 MG/5ML IV SOLN
0.4000 mg | Freq: Once | INTRAVENOUS | Status: AC
  Administered 2013-02-15: 0.4 mg via INTRAVENOUS

## 2013-02-15 MED ORDER — TECHNETIUM TC 99M SESTAMIBI GENERIC - CARDIOLITE
10.0000 | Freq: Once | INTRAVENOUS | Status: AC | PRN
  Administered 2013-02-15: 10 via INTRAVENOUS

## 2013-02-15 NOTE — Procedures (Addendum)
  [facility] [address] [place] [address] [phone]  Cardiology Nuclear Med Study  Donald Todd is a 79 y.o. male     MRN : [id number]     DOB: 1929/04/20  Procedure Date: 02/15/2013  Nuclear Med Background Indication for Stress Test:  Surgical Clearance and Abnormal EKG History:  CAD;MI-1993;STENT/PTCA-03/1996,07/2010,2005 Cardiac Risk Factors: Family History - CAD, History of Smoking, Hypertension and Lipids  Symptoms:  PT DENIES SYMPTOMS   Nuclear Pre-Procedure Caffeine/Decaff Intake:  7:00pm NPO After: 5:00am   IV Site: R Forearm  IV 0.9% NS with Angio Cath:  22g  Chest Size (in):  42" IV Started by: Emmit Pomfret, RN  Height: 5\' 8"  (1.727 m)  Cup Size: n/a  BMI:  Body mass index is 26.16 kg/(m^2). Weight:  172 lb (78.019 kg)   Tech Comments:  N/A    Nuclear Med Study 1 or 2 day study: 1 day  Stress Test Type:  Lexiscan  Order Authorizing Provider:  Obie Dredge   Resting Radionuclide: Technetium 39m Sestamibi  Resting Radionuclide Dose: 10.5 mCi   Stress Radionuclide:  Technetium 24m Sestamibi  Stress Radionuclide Dose: 30.0 mCi           Stress Protocol Rest HR: 54 Stress HR: 70  Rest BP: 142/95 Stress BP: 158/85  Exercise Time (min): n/a METS: n/a   Predicted Max HR: 139 bpm % Max HR: 50.36 bpm Rate Pressure Product: 21308  Dose of Adenosine (mg):  n/a Dose of Lexiscan: 0.4 mg  Dose of Atropine (mg): n/a Dose of Dobutamine: n/a mcg/kg/min (at max HR)  Stress Test Technologist: Esperanza Sheets, CCT Nuclear Technologist: Koren Shiver, CNMT   Rest Procedure:  Myocardial perfusion imaging was performed at rest 45 minutes following the intravenous administration of Technetium 62m Sestamibi. Stress Procedure:  The patient received IV Lexiscan 0.4 mg over 15-seconds.  Technetium 73m Sestamibi injected at 30-seconds.  There were no significant changes with Lexiscan.  Quantitative spect images  were obtained after a 45 minute delay.  Transient Ischemic Dilatation (Normal <1.22):  0.95 Lung/Heart Ratio (Normal <0.45):  0.29 QGS EDV:  91 ml QGS ESV:  42 ml LV Ejection Fraction: 54%  Rest ECG: NSR - Normal EKG  Stress ECG: No significant change from baseline ECG  QPS Raw Data Images:  Normal; no motion artifact; normal heart/lung ratio. Stress Images:  There is decreased uptake in the lateral wall. Rest Images:  There is decreased uptake in the lateral wall. Subtraction (SDS):  No evidence of ischemia.  Impression Exercise Capacity:  Lexiscan with no exercise. BP Response:  Normal blood pressure response. Clinical Symptoms:  Mild chest pain/dyspnea. ECG Impression:  No significant ECG changes with Lexiscan. Comparison with Prior Nuclear Study: No significant change from previous study in 2011  Overall Impression:  Low risk stress nuclear study. Moderate sized, intense defect in the lateral wall consistent with known LCx territory scar. No reversible ischemia.  LV Wall Motion:  NL LV Function; NL Wall Motion; EF 54%  Chrystie Nose, MD, [facility] Board Certified in Nuclear Cardiology Attending Cardiologist [facility]   Chrystie Nose, MD  02/15/2013 1:28 PM

## 2013-02-21 DIAGNOSIS — M62838 Other muscle spasm: Secondary | ICD-10-CM | POA: Diagnosis not present

## 2013-02-21 DIAGNOSIS — M9981 Other biomechanical lesions of cervical region: Secondary | ICD-10-CM | POA: Diagnosis not present

## 2013-02-21 DIAGNOSIS — M999 Biomechanical lesion, unspecified: Secondary | ICD-10-CM | POA: Diagnosis not present

## 2013-02-21 DIAGNOSIS — M25519 Pain in unspecified shoulder: Secondary | ICD-10-CM | POA: Diagnosis not present

## 2013-02-21 DIAGNOSIS — M5000 Cervical disc disorder with myelopathy, unspecified cervical region: Secondary | ICD-10-CM | POA: Diagnosis not present

## 2013-02-21 DIAGNOSIS — M436 Torticollis: Secondary | ICD-10-CM | POA: Diagnosis not present

## 2013-02-22 ENCOUNTER — Other Ambulatory Visit (INDEPENDENT_AMBULATORY_CARE_PROVIDER_SITE_OTHER): Payer: Self-pay | Admitting: General Surgery

## 2013-02-22 ENCOUNTER — Encounter (INDEPENDENT_AMBULATORY_CARE_PROVIDER_SITE_OTHER): Payer: Self-pay

## 2013-02-22 ENCOUNTER — Telehealth (INDEPENDENT_AMBULATORY_CARE_PROVIDER_SITE_OTHER): Payer: Self-pay

## 2013-02-22 DIAGNOSIS — M999 Biomechanical lesion, unspecified: Secondary | ICD-10-CM | POA: Diagnosis not present

## 2013-02-22 DIAGNOSIS — M5000 Cervical disc disorder with myelopathy, unspecified cervical region: Secondary | ICD-10-CM | POA: Diagnosis not present

## 2013-02-22 DIAGNOSIS — M436 Torticollis: Secondary | ICD-10-CM | POA: Diagnosis not present

## 2013-02-22 DIAGNOSIS — M9981 Other biomechanical lesions of cervical region: Secondary | ICD-10-CM | POA: Diagnosis not present

## 2013-02-22 DIAGNOSIS — M25519 Pain in unspecified shoulder: Secondary | ICD-10-CM | POA: Diagnosis not present

## 2013-02-22 DIAGNOSIS — M62838 Other muscle spasm: Secondary | ICD-10-CM | POA: Diagnosis not present

## 2013-02-22 NOTE — Telephone Encounter (Signed)
Patient notified cardiac clearance has been rec'd from Dr. Gery Pray.  Patient to hold aspirin 5 days prior to hernia repair and restart after surgery.  Written orders placed in INBOX for OR scheduling.  Patient aware that we will call him no later than middle of next week.

## 2013-02-28 DIAGNOSIS — M436 Torticollis: Secondary | ICD-10-CM | POA: Diagnosis not present

## 2013-02-28 DIAGNOSIS — M9981 Other biomechanical lesions of cervical region: Secondary | ICD-10-CM | POA: Diagnosis not present

## 2013-02-28 DIAGNOSIS — M999 Biomechanical lesion, unspecified: Secondary | ICD-10-CM | POA: Diagnosis not present

## 2013-02-28 DIAGNOSIS — M62838 Other muscle spasm: Secondary | ICD-10-CM | POA: Diagnosis not present

## 2013-02-28 DIAGNOSIS — M25519 Pain in unspecified shoulder: Secondary | ICD-10-CM | POA: Diagnosis not present

## 2013-02-28 DIAGNOSIS — M5000 Cervical disc disorder with myelopathy, unspecified cervical region: Secondary | ICD-10-CM | POA: Diagnosis not present

## 2013-03-07 DIAGNOSIS — M436 Torticollis: Secondary | ICD-10-CM | POA: Diagnosis not present

## 2013-03-07 DIAGNOSIS — M9981 Other biomechanical lesions of cervical region: Secondary | ICD-10-CM | POA: Diagnosis not present

## 2013-03-07 DIAGNOSIS — M999 Biomechanical lesion, unspecified: Secondary | ICD-10-CM | POA: Diagnosis not present

## 2013-03-07 DIAGNOSIS — M25519 Pain in unspecified shoulder: Secondary | ICD-10-CM | POA: Diagnosis not present

## 2013-03-07 DIAGNOSIS — M5000 Cervical disc disorder with myelopathy, unspecified cervical region: Secondary | ICD-10-CM | POA: Diagnosis not present

## 2013-03-07 DIAGNOSIS — M62838 Other muscle spasm: Secondary | ICD-10-CM | POA: Diagnosis not present

## 2013-03-13 ENCOUNTER — Encounter (HOSPITAL_COMMUNITY): Payer: Self-pay | Admitting: Pharmacy Technician

## 2013-03-13 DIAGNOSIS — L570 Actinic keratosis: Secondary | ICD-10-CM | POA: Diagnosis not present

## 2013-03-13 DIAGNOSIS — D485 Neoplasm of uncertain behavior of skin: Secondary | ICD-10-CM | POA: Diagnosis not present

## 2013-03-13 DIAGNOSIS — B354 Tinea corporis: Secondary | ICD-10-CM | POA: Diagnosis not present

## 2013-03-17 DIAGNOSIS — B029 Zoster without complications: Secondary | ICD-10-CM | POA: Diagnosis not present

## 2013-03-17 DIAGNOSIS — L259 Unspecified contact dermatitis, unspecified cause: Secondary | ICD-10-CM | POA: Diagnosis not present

## 2013-03-18 ENCOUNTER — Telehealth (INDEPENDENT_AMBULATORY_CARE_PROVIDER_SITE_OTHER): Payer: Self-pay

## 2013-03-18 NOTE — Telephone Encounter (Signed)
Reviewed with Dr. Johna Sheriff the patient concern for Shingles outbreak.  Per Dr. Johna Sheriff patient can proceed with surgery as long as there's no active infection or any blisters or skin sores on or near the hernia surgical repair site.  Patient states he's being treated with medication at this time and will complete the medication on Saturday 03/23/13.  Patient can proceed on to his Pre-Op appointment on 03/19/13.

## 2013-03-18 NOTE — Telephone Encounter (Signed)
Message copied by Maryan Puls on Mon Mar 18, 2013  4:48 PM ------      Message from: Rise Paganini      Created: Mon Mar 18, 2013  9:02 AM      Regarding: [place]      Contact: [phone]       Patient was diagnosed with shingles on yesterday. He has hernia surgery on 03/29/13. Patient would like to know if this will interfere with his surgery or not. Pre-admission is on tomorrow and he would like to know if he should cancel that as well. Please call the patient. Thank you. ------

## 2013-03-18 NOTE — Patient Instructions (Signed)
Donald Todd  03/18/2013   Your procedure is scheduled on:  03/29/13   Report to [facility] at     0700 AM.  Call this number if you have problems the morning of surgery: [phone]   Remember:   Do not eat food or drink liquids after midnight.   Take these medicines the morning of surgery with A SIP OF WATER:    Do not wear jewelry,   Do not wear lotions, powders, or perfumes.    Men may shave face and neck.  Do not bring valuables to the hospital.  Contacts, dentures or bridgework may not be worn into surgery.       Patients discharged the day of surgery will not be allowed to drive  home.  Name and phone number of your driver:   SEE CHG INSTRUCTION SHEET    Please read over the following fact sheets that you were given: MRSA Information, coughing and deep breathing exercises, leg exercises               Failure to comply with these instructions may result in cancellation of your surgery.                Patient Signature ____________________________              Nurse Signature _____________________________

## 2013-03-19 ENCOUNTER — Encounter (HOSPITAL_COMMUNITY): Payer: Self-pay

## 2013-03-19 ENCOUNTER — Encounter (HOSPITAL_COMMUNITY)
Admission: RE | Admit: 2013-03-19 | Discharge: 2013-03-19 | Disposition: A | Discharge status: Home / Self Care | Payer: Medicare Other | Source: Ambulatory Visit | Attending: General Surgery | Admitting: General Surgery

## 2013-03-19 DIAGNOSIS — D176 Benign lipomatous neoplasm of spermatic cord: Secondary | ICD-10-CM | POA: Diagnosis not present

## 2013-03-19 DIAGNOSIS — I252 Old myocardial infarction: Secondary | ICD-10-CM | POA: Diagnosis not present

## 2013-03-19 DIAGNOSIS — I2510 Atherosclerotic heart disease of native coronary artery without angina pectoris: Secondary | ICD-10-CM | POA: Diagnosis not present

## 2013-03-19 DIAGNOSIS — N189 Chronic kidney disease, unspecified: Secondary | ICD-10-CM | POA: Diagnosis not present

## 2013-03-19 DIAGNOSIS — Z7982 Long term (current) use of aspirin: Secondary | ICD-10-CM | POA: Diagnosis not present

## 2013-03-19 DIAGNOSIS — E785 Hyperlipidemia, unspecified: Secondary | ICD-10-CM | POA: Diagnosis not present

## 2013-03-19 DIAGNOSIS — Z01812 Encounter for preprocedural laboratory examination: Secondary | ICD-10-CM | POA: Diagnosis not present

## 2013-03-19 DIAGNOSIS — Z79899 Other long term (current) drug therapy: Secondary | ICD-10-CM | POA: Diagnosis not present

## 2013-03-19 DIAGNOSIS — I129 Hypertensive chronic kidney disease with stage 1 through stage 4 chronic kidney disease, or unspecified chronic kidney disease: Secondary | ICD-10-CM | POA: Diagnosis not present

## 2013-03-19 DIAGNOSIS — K4090 Unilateral inguinal hernia, without obstruction or gangrene, not specified as recurrent: Secondary | ICD-10-CM | POA: Diagnosis not present

## 2013-03-19 HISTORY — DX: Malignant (primary) neoplasm, unspecified: C80.1

## 2013-03-19 LAB — BASIC METABOLIC PANEL
CO2: 26 mEq/L (ref 19–32)
Calcium: 9.4 mg/dL (ref 8.4–10.5)
GFR calc Af Amer: 51 mL/min — ABNORMAL LOW (ref >90)
Sodium: 142 mEq/L (ref 135–145)

## 2013-03-19 LAB — CBC
MCH: 29.9 pg (ref 26.0–34.0)
Platelets: 170 10*3/uL (ref 150–400)
RBC: 4.51 MIL/uL (ref 4.22–5.81)
RDW: 13.3 % (ref 11.5–15.5)
WBC: 8 10*3/uL (ref 4.0–10.5)

## 2013-03-19 LAB — SURGICAL PCR SCREEN: MRSA, PCR: NEGATIVE

## 2013-03-19 NOTE — Progress Notes (Signed)
Patient reports current ringworm and shingles diagnosis.  To complete medication regimen on 03/23/13 per patient.  Patient states Dr Johna Sheriff is aware.  Dr Johna Sheriff to see patient for preop appointment on 03/20/13.

## 2013-03-19 NOTE — Progress Notes (Signed)
12/22/12 last office visit note from Dr Allyson Sabal not dictated from fax sent.   Last office visit note dictated 11/122/13 from Dr Allyson Sabal on chart

## 2013-03-19 NOTE — Progress Notes (Signed)
Stress Test 3/14 EPIC  Duplex Renal Artery 01/28/13 EPIC  Cardiac clearance under CV Procedures dated 02/01/13.  EPIC

## 2013-03-20 ENCOUNTER — Encounter (INDEPENDENT_AMBULATORY_CARE_PROVIDER_SITE_OTHER): Payer: Self-pay | Admitting: General Surgery

## 2013-03-20 ENCOUNTER — Ambulatory Visit (INDEPENDENT_AMBULATORY_CARE_PROVIDER_SITE_OTHER): Payer: Medicare Other | Admitting: General Surgery

## 2013-03-20 VITALS — BP 138/82 | HR 78 | Resp 18 | Ht 68.0 in | Wt 178.0 lb

## 2013-03-20 DIAGNOSIS — K4090 Unilateral inguinal hernia, without obstruction or gangrene, not specified as recurrent: Secondary | ICD-10-CM

## 2013-03-20 NOTE — Progress Notes (Signed)
HPI  Patient returns to the office. He recently began to have intermittent pain in his left groin. This often occurs when flexing his thighs such as driving and has been enough to make him stop and get out of the car and walk around on occasion. He saw Dr. Tenny Craw who felt that he had a left inguinal hernia. I have performed laparoscopic repair of a right inguinal hernia previously. He has an occasional twinge of mild discomfort in the right groin. No GI symptoms. He has developed a rash typical for shingles on the left side of his abdomen and was concerned this might interfere with the surgery and came by to let me check this.  Past Medical History   Diagnosis  Date   .  Hypertension      renal dopplers 218.13-normal patency   .  Chronic kidney disease    .  Prostate disease      Cancer S/P seed implant   .  Arthritis    .  Inguinal hernia  08/01/11     right   .  Heart attack  1993   .  H/O unilateral nephrectomy      lt. nephrectomy   .  CAD (coronary artery disease)      last cath. 07-09-2010   .  Hyperlipemia    .  H/O cardiovascular stress test  08-04-2010     ef 55% NO ISCHEMIA    Past Surgical History   Procedure  Laterality  Date   .  Angioplasty   1993   .  Stent implants   1997   .  Athroscopic knee surgery   2002     right   .  Radiation treatment   2002     prostate   .  Colonscopy   2004     with biopsy   .  Kidney surgery   2002 and 2005     2002 - partial removal of left. 2005 complete removal   .  Bone scan   2005   .  Joint replacement   2006 and 2010     left 2006, right 2010   .  Cholecystectomy   05/05/2009     Laparoscopic   .  Hernia repair   08/01/11     RIH   .  Coronary angioplasty with stent placement   03-15-1996     pci to rci and ramus branch using j&j [id number] stent,post dialated at 20atm (rca) and 14 atm (ramus branch. ef 55%   .  Cardiac catheterization   07-09-2010     normal left ventricular function, coronary obsstructive disease with 20% proximal an  30-40% mid lt. anterior descending narrowing ; widely patent stent in the proximal ramus intermediate vessel; 50-70-% narrowing in the mid circumflex,and widely patent stent in the rt. coronary   .  Cardiac catheterization   12-22-2003     widely patent stents with noncritical coronary artery disease and normalLV function    Current Outpatient Prescriptions   Medication  Sig  Dispense  Refill   .  amLODipine (NORVASC) 2.5 MG tablet  Take 2.5 mg by mouth daily. Patient taking 7.5 mg total per dosage     .  AMOXICILLIN PO  Take 50 mg by mouth as needed. Only before dental appointments     .  aspirin 81 MG tablet  Take 81 mg by mouth daily.     [name]  atorvastatin (LIPITOR) 40 MG tablet  Take  40 mg by mouth daily.     .  B Complex Vitamins (B-COMPLEX/B-12) TABS  Take by mouth every morning.     .  enalapril (VASOTEC) 10 MG tablet  Take 10 mg by mouth daily.     .  ISOSORBIDE PO  Take 120 mg by mouth. Patient takes 90 mg in morning, and 30 mg in evening     .  metoprolol (TOPROL-XL) 50 MG 24 hr tablet  Take 50 mg by mouth daily.     .  Multiple Vitamins-Minerals (OCUVITE PO)  Take by mouth daily.     .  nitroGLYCERIN (NITROSTAT) 0.4 MG SL tablet  Place 0.4 mg under the tongue as needed.     [name]  omeprazole (PRILOSEC) 20 MG capsule  Take 20 mg by mouth daily.     [name]  oxybutynin (DITROPAN) 5 MG tablet  Take 5 mg by mouth every morning.      No current facility-administered medications for this visit.    Allergies   Allergen  Reactions   .  Tape  Hives   .  Iohexol      Desc: PT DOES RECALL REACTION JUST SAYS IT WAS A LONG TIME AGO FOR KIDNEY X-RAYS AND HE DIDN'T TOLERATE IT WELL-ARS 05/02/09-NOTE E-CHART STATES A HOT FEELING IS HIS REACTION, BUT HE CAN'T CONFIRM THAT WAS ALL   Review of Systems  Constitutional: Negative for fever, appetite change and fatigue.  Respiratory: Negative.  Cardiovascular: Negative.  Gastrointestinal: Negative.  Genitourinary: Negative.  Objective:   Physical Exam  BP  146/72  Pulse 60  Resp 16  Ht 5\' 8"  (1.727 m)  Wt 174 lb (78.926 kg)  BMI 26.46 kg/m2  General: Elderly healthy-appearing Caucasian male looking somewhat younger than his age  Skin: There is a drying papular rash, patchy, scattered over the left upper abdomen and one area of well lateral near the left hip but nothing in the area of our proposed incision in the left groin and no evidence of infection.  HEENT: No masses or thyromegaly. Sclera nonicteric  Lungs: Clear equal breath sounds bilaterally  Cardiac: Regular rate and rhythm. No edema or JVD  Abdomen: Long healed left flank incision with mild diffuse herniation and nontender. I cannot feel any hernia in the right groin and it is nontender. There he is a small to moderate sized left inguinal hernia presenting at the internal ring and reducible and slightly tender.  GU: Normal male  Extremities: No joint swelling or edema  Neurologic: Alert and oriented. Gait normal.  Assessment:   Symptomatic left inguinal hernia. I discussed options with the patient including observation and repair. He is having some significant episodes of pain and wants to have this fixed and I think this is reasonable. As we have approached the right side laparoscopically previously I recommended an open repair on the left due to the scarring that would be present. We discussed the indications for the procedure use of general anesthesia an expected recovery as well as risks of bleeding, infection, recurrence, chronic pain. He is recovering from shingles which I do not believe will interfere at all with the surgical procedure. All questions were answered.

## 2013-03-21 NOTE — Progress Notes (Signed)
Notified Infection Control regarding patient diagnosis of ringworm and shingles.  Made her aware Dr Johna Sheriff is aware and see note in EPIC on 03/20/13.   Seabron Spates in Infection  Control  [phone] or [phone] Main Number  Assess patient on day of surgery regarding rash . If rash looks " dried up" = NO ISOLATION if rash is not " dried up" = CONTACT INFECTION CONTROL at [id number].

## 2013-03-27 DIAGNOSIS — L259 Unspecified contact dermatitis, unspecified cause: Secondary | ICD-10-CM | POA: Diagnosis not present

## 2013-03-29 ENCOUNTER — Encounter (HOSPITAL_COMMUNITY)
Admission: RE | Disposition: A | Discharge status: Home / Self Care | Payer: Self-pay | Source: Ambulatory Visit | Attending: General Surgery

## 2013-03-29 ENCOUNTER — Ambulatory Visit (HOSPITAL_COMMUNITY): Payer: Medicare Other | Admitting: Anesthesiology

## 2013-03-29 ENCOUNTER — Ambulatory Visit (HOSPITAL_COMMUNITY)
Admission: RE | Admit: 2013-03-29 | Discharge: 2013-03-29 | Disposition: A | Discharge status: Home / Self Care | Payer: Medicare Other | Source: Ambulatory Visit | Attending: General Surgery | Admitting: General Surgery

## 2013-03-29 ENCOUNTER — Encounter (HOSPITAL_COMMUNITY): Payer: Self-pay | Admitting: Anesthesiology

## 2013-03-29 ENCOUNTER — Encounter (HOSPITAL_COMMUNITY): Payer: Self-pay | Admitting: *Deleted

## 2013-03-29 DIAGNOSIS — I252 Old myocardial infarction: Secondary | ICD-10-CM | POA: Diagnosis not present

## 2013-03-29 DIAGNOSIS — Z7982 Long term (current) use of aspirin: Secondary | ICD-10-CM | POA: Insufficient documentation

## 2013-03-29 DIAGNOSIS — N189 Chronic kidney disease, unspecified: Secondary | ICD-10-CM | POA: Diagnosis not present

## 2013-03-29 DIAGNOSIS — Z79899 Other long term (current) drug therapy: Secondary | ICD-10-CM | POA: Insufficient documentation

## 2013-03-29 DIAGNOSIS — I2510 Atherosclerotic heart disease of native coronary artery without angina pectoris: Secondary | ICD-10-CM | POA: Diagnosis not present

## 2013-03-29 DIAGNOSIS — Z01812 Encounter for preprocedural laboratory examination: Secondary | ICD-10-CM | POA: Insufficient documentation

## 2013-03-29 DIAGNOSIS — I129 Hypertensive chronic kidney disease with stage 1 through stage 4 chronic kidney disease, or unspecified chronic kidney disease: Secondary | ICD-10-CM | POA: Diagnosis not present

## 2013-03-29 DIAGNOSIS — K4090 Unilateral inguinal hernia, without obstruction or gangrene, not specified as recurrent: Secondary | ICD-10-CM | POA: Insufficient documentation

## 2013-03-29 DIAGNOSIS — D176 Benign lipomatous neoplasm of spermatic cord: Secondary | ICD-10-CM | POA: Insufficient documentation

## 2013-03-29 DIAGNOSIS — E785 Hyperlipidemia, unspecified: Secondary | ICD-10-CM | POA: Insufficient documentation

## 2013-03-29 DIAGNOSIS — I10 Essential (primary) hypertension: Secondary | ICD-10-CM | POA: Diagnosis not present

## 2013-03-29 HISTORY — PX: INGUINAL HERNIA REPAIR: SHX194

## 2013-03-29 HISTORY — PX: INSERTION OF MESH: SHX5868

## 2013-03-29 SURGERY — REPAIR, HERNIA, INGUINAL, ADULT
Anesthesia: General | Site: Groin | Laterality: Left | Wound class: Clean

## 2013-03-29 MED ORDER — MEPERIDINE HCL 50 MG/ML IJ SOLN
INTRAMUSCULAR | Status: AC
  Filled 2013-03-29: qty 1

## 2013-03-29 MED ORDER — MEPERIDINE HCL 50 MG/ML IJ SOLN
6.2500 mg | INTRAMUSCULAR | Status: DC | PRN
  Administered 2013-03-29: 6.25 mg via INTRAVENOUS

## 2013-03-29 MED ORDER — FENTANYL CITRATE 0.05 MG/ML IJ SOLN
INTRAMUSCULAR | Status: DC | PRN
  Administered 2013-03-29 (×2): 25 ug via INTRAVENOUS
  Administered 2013-03-29: 50 ug via INTRAVENOUS
  Administered 2013-03-29 (×2): 25 ug via INTRAVENOUS
  Administered 2013-03-29: 50 ug via INTRAVENOUS

## 2013-03-29 MED ORDER — LIDOCAINE HCL (CARDIAC) 20 MG/ML IV SOLN
INTRAVENOUS | Status: DC | PRN
  Administered 2013-03-29: 50 mg via INTRAVENOUS

## 2013-03-29 MED ORDER — ACETAMINOPHEN 10 MG/ML IV SOLN
INTRAVENOUS | Status: DC | PRN
  Administered 2013-03-29: 1000 mg via INTRAVENOUS

## 2013-03-29 MED ORDER — PROMETHAZINE HCL 25 MG/ML IJ SOLN: 6.2500 mg | INTRAMUSCULAR | Status: DC | PRN

## 2013-03-29 MED ORDER — LACTATED RINGERS IV SOLN
INTRAVENOUS | Status: DC
Start: 2013-03-29 — End: 2013-03-29
  Administered 2013-03-29: 13:00:00 via INTRAVENOUS
  Administered 2013-03-29: 1000 mL via INTRAVENOUS

## 2013-03-29 MED ORDER — HYDROMORPHONE HCL PF 1 MG/ML IJ SOLN: 0.2500 mg | INTRAMUSCULAR | Status: DC | PRN

## 2013-03-29 MED ORDER — BUPIVACAINE-EPINEPHRINE 0.25% -1:200000 IJ SOLN
INTRAMUSCULAR | Status: AC
  Filled 2013-03-29: qty 1

## 2013-03-29 MED ORDER — CEFAZOLIN SODIUM-DEXTROSE 2-3 GM-% IV SOLR
2.0000 g | INTRAVENOUS | Status: AC
  Administered 2013-03-29: 2 g via INTRAVENOUS

## 2013-03-29 MED ORDER — ACETAMINOPHEN 10 MG/ML IV SOLN
INTRAVENOUS | Status: AC
  Filled 2013-03-29: qty 100

## 2013-03-29 MED ORDER — SODIUM BICARBONATE 4 % IV SOLN
INTRAVENOUS | Status: AC
  Filled 2013-03-29: qty 5

## 2013-03-29 MED ORDER — ONDANSETRON HCL 4 MG/2ML IJ SOLN
INTRAMUSCULAR | Status: DC | PRN
  Administered 2013-03-29: 4 mg via INTRAVENOUS

## 2013-03-29 MED ORDER — ACETAMINOPHEN 10 MG/ML IV SOLN: 1000.0000 mg | Freq: Once | INTRAVENOUS | Status: DC | PRN

## 2013-03-29 MED ORDER — HYDROCODONE-ACETAMINOPHEN 5-325 MG PO TABS: 1.0000 | ORAL_TABLET | ORAL | Status: DC | PRN

## 2013-03-29 MED ORDER — BUPIVACAINE-EPINEPHRINE 0.25% -1:200000 IJ SOLN
INTRAMUSCULAR | Status: DC | PRN
  Administered 2013-03-29: 50 mL

## 2013-03-29 MED ORDER — CHLORHEXIDINE GLUCONATE 4 % EX LIQD
1.0000 "application " | Freq: Once | CUTANEOUS | Status: DC
  Filled 2013-03-29: qty 15

## 2013-03-29 MED ORDER — PROPOFOL 10 MG/ML IV BOLUS
INTRAVENOUS | Status: DC | PRN
  Administered 2013-03-29: 50 mg via INTRAVENOUS
  Administered 2013-03-29: 150 mg via INTRAVENOUS

## 2013-03-29 MED ORDER — CEFAZOLIN SODIUM-DEXTROSE 2-3 GM-% IV SOLR
INTRAVENOUS | Status: AC
Start: 2013-03-29 — End: 2013-03-29
  Filled 2013-03-29: qty 50

## 2013-03-29 MED ORDER — LIDOCAINE HCL 1 % IJ SOLN
INTRAMUSCULAR | Status: AC
  Filled 2013-03-29: qty 20

## 2013-03-29 SURGICAL SUPPLY — 43 items
ADH SKN CLS APL DERMABOND .7 (GAUZE/BANDAGES/DRESSINGS) ×2
BLADE HEX COATED 2.75 (ELECTRODE) ×2 IMPLANT
BLADE SURG 15 STRL LF DISP TIS (BLADE) ×1 IMPLANT
BLADE SURG 15 STRL SS (BLADE) ×2
BLADE SURG SZ10 CARB STEEL (BLADE) ×2 IMPLANT
CANISTER SUCTION 2500CC (MISCELLANEOUS) ×2 IMPLANT
CLOTH BEACON ORANGE TIMEOUT ST (SAFETY) ×2 IMPLANT
DECANTER SPIKE VIAL GLASS SM (MISCELLANEOUS) ×1 IMPLANT
DERMABOND ADVANCED (GAUZE/BANDAGES/DRESSINGS) ×2
DERMABOND ADVANCED .7 DNX12 (GAUZE/BANDAGES/DRESSINGS) IMPLANT
DRAIN PENROSE 18X1/2 LTX STRL (DRAIN) ×2 IMPLANT
DRAPE INCISE IOBAN 66X45 STRL (DRAPES) ×2 IMPLANT
DRAPE LAPAROTOMY TRNSV 102X78 (DRAPE) ×2 IMPLANT
ELECT REM PT RETURN 9FT ADLT (ELECTROSURGICAL) ×2
ELECTRODE REM PT RTRN 9FT ADLT (ELECTROSURGICAL) ×1 IMPLANT
GLOVE BIOGEL PI IND STRL 7.0 (GLOVE) ×1 IMPLANT
GLOVE BIOGEL PI INDICATOR 7.0 (GLOVE) ×3
GOWN STRL NON-REIN LRG LVL3 (GOWN DISPOSABLE) ×2 IMPLANT
GOWN STRL REIN XL XLG (GOWN DISPOSABLE) ×4 IMPLANT
KIT BASIN OR (CUSTOM PROCEDURE TRAY) ×2 IMPLANT
MESH PARIETEX 5.2X3.6 (Mesh General) ×1 IMPLANT
NDL HYPO 25X1 1.5 SAFETY (NEEDLE) ×1 IMPLANT
NEEDLE HYPO 22GX1.5 SAFETY (NEEDLE) IMPLANT
NEEDLE HYPO 25X1 1.5 SAFETY (NEEDLE) ×2 IMPLANT
NS IRRIG 1000ML POUR BTL (IV SOLUTION) ×2 IMPLANT
PACK BASIC VI WITH GOWN DISP (CUSTOM PROCEDURE TRAY) ×2 IMPLANT
PENCIL BUTTON HOLSTER BLD 10FT (ELECTRODE) ×2 IMPLANT
SPONGE GAUZE 4X4 12PLY (GAUZE/BANDAGES/DRESSINGS) IMPLANT
SPONGE LAP 4X18 X RAY DECT (DISPOSABLE) ×2 IMPLANT
STRIP CLOSURE SKIN 1/2X4 (GAUZE/BANDAGES/DRESSINGS) IMPLANT
SUT MNCRL AB 4-0 PS2 18 (SUTURE) ×2 IMPLANT
SUT PROLENE 2 0 CT2 30 (SUTURE) ×5 IMPLANT
SUT SILK 2 0 SH (SUTURE) ×1 IMPLANT
SUT VIC AB 3-0 54XBRD REEL (SUTURE) ×1 IMPLANT
SUT VIC AB 3-0 BRD 54 (SUTURE) ×2
SUT VIC AB 3-0 CT1 27 (SUTURE)
SUT VIC AB 3-0 CT1 TAPERPNT 27 (SUTURE) ×1 IMPLANT
SUT VIC AB 3-0 SH 27 (SUTURE)
SUT VIC AB 3-0 SH 27XBRD (SUTURE) IMPLANT
SYR BULB IRRIGATION 50ML (SYRINGE) ×2 IMPLANT
SYR CONTROL 10ML LL (SYRINGE) ×2 IMPLANT
TOWEL OR 17X26 10 PK STRL BLUE (TOWEL DISPOSABLE) ×2 IMPLANT
YANKAUER SUCT BULB TIP 10FT TU (MISCELLANEOUS) ×2 IMPLANT

## 2013-03-29 NOTE — Progress Notes (Signed)
patient was placed on airborne isolation 03/29/13 at 0745 per protocol

## 2013-03-29 NOTE — H&P (View-Only) (Signed)
HPI  Patient returns to the office. He recently began to have intermittent pain in his left groin. This often occurs when flexing his thighs such as driving and has been enough to make him stop and get out of the car and walk around on occasion. He saw Dr. Ross who felt that he had a left inguinal hernia. I have performed laparoscopic repair of a right inguinal hernia previously. He has an occasional twinge of mild discomfort in the right groin. No GI symptoms. He has developed a rash typical for shingles on the left side of his abdomen and was concerned this might interfere with the surgery and came by to let me check this.  Past Medical History   Diagnosis  Date   .  Hypertension      renal dopplers 218.13-normal patency   .  Chronic kidney disease    .  Prostate disease      Cancer S/P seed implant   .  Arthritis    .  Inguinal hernia  08/01/11     right   .  Heart attack  1993   .  H/O unilateral nephrectomy      lt. nephrectomy   .  CAD (coronary artery disease)      last cath. 07-09-2010   .  Hyperlipemia    .  H/O cardiovascular stress test  08-04-2010     ef 55% NO ISCHEMIA    Past Surgical History   Procedure  Laterality  Date   .  Angioplasty   1993   .  Stent implants   1997   .  Athroscopic knee surgery   2002     right   .  Radiation treatment   2002     prostate   .  Colonscopy   2004     with biopsy   .  Kidney surgery   2002 and 2005     2002 - partial removal of left. 2005 complete removal   .  Bone scan   2005   .  Joint replacement   2006 and 2010     left 2006, right 2010   .  Cholecystectomy   05/05/2009     Laparoscopic   .  Hernia repair   08/01/11     RIH   .  Coronary angioplasty with stent placement   03-15-1996     pci to rci and ramus branch using j&j [id number] stent,post dialated at 20atm (rca) and 14 atm (ramus branch. ef 55%   .  Cardiac catheterization   07-09-2010     normal left ventricular function, coronary obsstructive disease with 20% proximal an  30-40% mid lt. anterior descending narrowing ; widely patent stent in the proximal ramus intermediate vessel; 50-70-% narrowing in the mid circumflex,and widely patent stent in the rt. coronary   .  Cardiac catheterization   12-22-2003     widely patent stents with noncritical coronary artery disease and normalLV function    Current Outpatient Prescriptions   Medication  Sig  Dispense  Refill   .  amLODipine (NORVASC) 2.5 MG tablet  Take 2.5 mg by mouth daily. Patient taking 7.5 mg total per dosage     .  AMOXICILLIN PO  Take 50 mg by mouth as needed. Only before dental appointments     .  aspirin 81 MG tablet  Take 81 mg by mouth daily.     .  atorvastatin (LIPITOR) 40 MG tablet  Take   40 mg by mouth daily.     .  B Complex Vitamins (B-COMPLEX/B-12) TABS  Take by mouth every morning.     .  enalapril (VASOTEC) 10 MG tablet  Take 10 mg by mouth daily.     .  ISOSORBIDE PO  Take 120 mg by mouth. Patient takes 90 mg in morning, and 30 mg in evening     .  metoprolol (TOPROL-XL) 50 MG 24 hr tablet  Take 50 mg by mouth daily.     .  Multiple Vitamins-Minerals (OCUVITE PO)  Take by mouth daily.     .  nitroGLYCERIN (NITROSTAT) 0.4 MG SL tablet  Place 0.4 mg under the tongue as needed.     .  omeprazole (PRILOSEC) 20 MG capsule  Take 20 mg by mouth daily.     .  oxybutynin (DITROPAN) 5 MG tablet  Take 5 mg by mouth every morning.      No current facility-administered medications for this visit.    Allergies   Allergen  Reactions   .  Tape  Hives   .  Iohexol      Desc: PT DOES RECALL REACTION JUST SAYS IT WAS A LONG TIME AGO FOR KIDNEY X-RAYS AND HE DIDN'T TOLERATE IT WELL-ARS 05/02/09-NOTE E-CHART STATES A HOT FEELING IS HIS REACTION, BUT HE CAN'T CONFIRM THAT WAS ALL   Review of Systems  Constitutional: Negative for fever, appetite change and fatigue.  Respiratory: Negative.  Cardiovascular: Negative.  Gastrointestinal: Negative.  Genitourinary: Negative.  Objective:   Physical Exam  BP  146/72  Pulse 60  Resp 16  Ht 5' 8" (1.727 m)  Wt 174 lb (78.926 kg)  BMI 26.46 kg/m2  General: Elderly healthy-appearing Caucasian male looking somewhat younger than his age  Skin: There is a drying papular rash, patchy, scattered over the left upper abdomen and one area of well lateral near the left hip but nothing in the area of our proposed incision in the left groin and no evidence of infection.  HEENT: No masses or thyromegaly. Sclera nonicteric  Lungs: Clear equal breath sounds bilaterally  Cardiac: Regular rate and rhythm. No edema or JVD  Abdomen: Long healed left flank incision with mild diffuse herniation and nontender. I cannot feel any hernia in the right groin and it is nontender. There he is a small to moderate sized left inguinal hernia presenting at the internal ring and reducible and slightly tender.  GU: Normal male  Extremities: No joint swelling or edema  Neurologic: Alert and oriented. Gait normal.  Assessment:   Symptomatic left inguinal hernia. I discussed options with the patient including observation and repair. He is having some significant episodes of pain and wants to have this fixed and I think this is reasonable. As we have approached the right side laparoscopically previously I recommended an open repair on the left due to the scarring that would be present. We discussed the indications for the procedure use of general anesthesia an expected recovery as well as risks of bleeding, infection, recurrence, chronic pain. He is recovering from shingles which I do not believe will interfere at all with the surgical procedure. All questions were answered.  

## 2013-03-29 NOTE — Interval H&P Note (Signed)
History and Physical Interval Note:  03/29/2013 9:03 AM  Donald Todd  has presented today for surgery, with the diagnosis of Left inguinal Hernia  The various methods of treatment have been discussed with the patient and family. After consideration of risks, benefits and other options for treatment, the patient has consented to  Procedure(s): HERNIA REPAIR INGUINAL ADULT (Left) as a surgical intervention .  The patient's history has been reviewed, patient examined, no change in status, stable for surgery.  I have reviewed the patient's chart and labs.  Questions were answered to the patient's satisfaction.     Marcio Hoque T

## 2013-03-29 NOTE — Anesthesia Preprocedure Evaluation (Addendum)
Anesthesia Evaluation  Patient identified by MRN, date of birth, ID band Patient awake    Reviewed: Allergy & Precautions, H&P , NPO status , Patient's Chart, lab work & pertinent test results, reviewed documented beta blocker date and time   Airway Mallampati: III TM Distance: >3 FB Neck ROM: Limited    Dental  (+) Dental Advisory Given and Teeth Intact   Pulmonary former smoker,  breath sounds clear to auscultation        Cardiovascular hypertension, Pt. on medications and Pt. on home beta blockers + CAD, + Past MI and + Cardiac Stents negative cardio ROS  Rhythm:Regular Rate:Normal     Neuro/Psych negative neurological ROS  negative psych ROS   GI/Hepatic negative GI ROS, Neg liver ROS,   Endo/Other  negative endocrine ROS  Renal/GU Renal InsufficiencyRenal diseasenegative Renal ROS     Musculoskeletal negative musculoskeletal ROS (+)   Abdominal   Peds  Hematology negative hematology ROS (+)   Anesthesia Other Findings   Reproductive/Obstetrics                         Anesthesia Physical Anesthesia Plan  ASA: III  Anesthesia Plan: General   Post-op Pain Management:    Induction: Intravenous  Airway Management Planned: LMA  Additional Equipment:   Intra-op Plan:   Post-operative Plan: Extubation in OR  Informed Consent: I have reviewed the patients History and Physical, chart, labs and discussed the procedure including the risks, benefits and alternatives for the proposed anesthesia with the patient or authorized representative who has indicated his/her understanding and acceptance.   Dental advisory given  Plan Discussed with: CRNA  Anesthesia Plan Comments:         Anesthesia Quick Evaluation

## 2013-03-29 NOTE — Op Note (Signed)
Preoperative Diagnosis: Left inguinal Hernia  Postoprative Diagnosis: Left inguinal Hernia  Procedure: Procedure(s): HERNIA REPAIR INGUINAL ADULT INSERTION OF MESH   Surgeon: Glenna Fellows T   Assistants: none  Anesthesia:  General LMA anesthesiaDiagnos  Indications:   Patient is an 79 year old male who presents with a symptomatic reducible left inguinal hernia. He has a previous history of laparoscopic right inguinal hernia repair. We have discussed options and elected to proceed with open repair of his left inguinal hernia with mesh. The discussion of indications and risks is detailed elsewhere.  Procedure Detail:  Patient is brought to the operating room, placed in the supine position on the operating table, and laryngeal mask general anesthesia induced. He received preoperative IV antibiotics. The left groin was widely sterilely prepped and draped. Patient timeout was performed and correct procedure verified. An oblique incision was made in the left groin and dissected carried down through the subcutaneous tissue and Scarpa's fascia. The external oblique was identified and divided along the lines of its fibers through the external ring. The cord was dissected up off the floor of the pubic tubercle. The ilioinguinal nerve was identified and carefully protected. The cord was freed back to the internal ring. There was a moderate sized direct hernia present. There was a small cord lipoma associated with the cord coming through the internal ring was dissected free and reduced. I dissected within the cord at the internal ring and there was no indirect sac. Initially I imbricated the floor of the inguinal canal with a running 2-0 Prolene beginning at the pubic tubercle and working back to the internal ring restoring a normal contour to the inguinal floor. Following this a piece of Parietex mesh was trimmed to size to fit the floor of the inguinal canal with tails to go around the cord at the  internal ring. It was sutured initially to the pubic tubercle and then to the iliopubic tract and inguinal ligament working medial to lateral until we were well lateral to the internal ring. Medially the mesh was sutured with interrupted 2-0 Prolene to the rectus sheath. There was nice broad coverage of the direct and indirect spaces. The tails of the mesh were sutured together lateral to the cord creating a new internal ring snug to a fingertip. The soft tissue was extensively infiltrated with Marcaine with epinephrine. The cord and ilioinguinal nerve were replaced in anatomic position. The external oblique was closed over this with running 2-0 Vicryl. Scarpa's fascia was closed with running 2 Vicryl. The skin was closed with running subcuticular 4-0 Monocryl and Dermabond. Sponge needle and instrument counts were correct.  Findings: Direct inguinal herni Estimated Blood Loss:  Minimal          Specimens: none        Complications:  * No complications entered in OR log *         Disposition: PACU - hemodynamically stable.         Condition: stable

## 2013-03-29 NOTE — Progress Notes (Signed)
Shaking and shivering stopped. 

## 2013-03-29 NOTE — Progress Notes (Signed)
Patient has shingles rash on his arms legs trunk and back. The rash itches but is not draining. He finished taking his Acyclovir 03/25/13. He took it for 8 days. Clydie Braun  (infection control ) said to place pt on airborn isolation, Dr. Johna Sheriff is aware tha pt has shingles and said to proceed with surgery

## 2013-03-29 NOTE — Transfer of Care (Signed)
Immediate Anesthesia Transfer of Care Note  Patient: Donald Todd  Procedure(s) Performed: Procedure(s): HERNIA REPAIR INGUINAL ADULT (Left) INSERTION OF MESH (Left)  Patient Location: PACU  Anesthesia Type:General  Level of Consciousness: awake, alert  and oriented  Airway & Oxygen Therapy: Patient Spontanous Breathing and Patient connected to face mask oxygen  Post-op Assessment: Report given to PACU RN and Post -op Vital signs reviewed and stable  Post vital signs: Reviewed and stable  Complications: No apparent anesthesia complications

## 2013-03-29 NOTE — Progress Notes (Signed)
Since patient's rash is dry and not draining and he has finished his 8 day course of acyclovir he should be placed on contact isolation per Isac Sarna infection control nurse.

## 2013-03-29 NOTE — Progress Notes (Signed)
Demerol 6.25 mg IVP given for shaking and shivering 

## 2013-03-29 NOTE — Anesthesia Postprocedure Evaluation (Signed)
Anesthesia Post Note  Patient: Donald Todd  Procedure(s) Performed: Procedure(s) (LRB): HERNIA REPAIR INGUINAL ADULT (Left) INSERTION OF MESH (Left)  Anesthesia type: General  Patient location: PACU  Post pain: Pain level controlled  Post assessment: Post-op Vital signs reviewed  Last Vitals: BP 122/57  Pulse 69  Temp(Src) 36.7 C (Oral)  Resp 13  SpO2 100%  Post vital signs: Reviewed  Level of consciousness: sedated  Complications: No apparent anesthesia complications

## 2013-04-01 ENCOUNTER — Encounter (HOSPITAL_COMMUNITY): Payer: Self-pay | Admitting: General Surgery

## 2013-04-05 ENCOUNTER — Encounter (INDEPENDENT_AMBULATORY_CARE_PROVIDER_SITE_OTHER): Payer: Medicare Other | Admitting: General Surgery

## 2013-04-10 DIAGNOSIS — R21 Rash and other nonspecific skin eruption: Secondary | ICD-10-CM | POA: Diagnosis not present

## 2013-04-25 ENCOUNTER — Encounter (INDEPENDENT_AMBULATORY_CARE_PROVIDER_SITE_OTHER): Payer: Self-pay | Admitting: General Surgery

## 2013-04-25 ENCOUNTER — Ambulatory Visit (INDEPENDENT_AMBULATORY_CARE_PROVIDER_SITE_OTHER): Payer: Medicare Other | Admitting: General Surgery

## 2013-04-25 VITALS — BP 126/70 | HR 68 | Temp 97.4°F | Resp 14 | Ht 68.0 in | Wt 171.4 lb

## 2013-04-25 DIAGNOSIS — Z09 Encounter for follow-up examination after completed treatment for conditions other than malignant neoplasm: Secondary | ICD-10-CM

## 2013-04-25 NOTE — Progress Notes (Signed)
History: Patient returns 4 weeks following open repair of his left inguinal hernia. He is coming along well. He noted typical soreness for the first couple of weeks but now denies any discomfort in his back to routine activities.  Exam: BP 126/70  Pulse 68  Temp(Src) 97.4 F (36.3 C)  Resp 14  Ht 5\' 8"  (1.727 m)  Wt 171 lb 6.4 oz (77.747 kg)  BMI 26.07 kg/m2 Abdominal exam shows his hernia repair to be well healed with no recurrence or swelling or other problems.  Assessment and plan: Doing well following open left inguinal hernia repair. He is released to full activity. Return as needed.

## 2013-05-01 DIAGNOSIS — D485 Neoplasm of uncertain behavior of skin: Secondary | ICD-10-CM | POA: Diagnosis not present

## 2013-05-14 DIAGNOSIS — H353190 Nonexudative age-related macular degeneration, unspecified eye, stage unspecified: Secondary | ICD-10-CM | POA: Diagnosis not present

## 2013-05-14 DIAGNOSIS — Z961 Presence of intraocular lens: Secondary | ICD-10-CM | POA: Diagnosis not present

## 2013-05-14 DIAGNOSIS — H524 Presbyopia: Secondary | ICD-10-CM | POA: Diagnosis not present

## 2013-05-15 ENCOUNTER — Ambulatory Visit (INDEPENDENT_AMBULATORY_CARE_PROVIDER_SITE_OTHER): Payer: Medicare Other | Admitting: Physician Assistant

## 2013-05-15 ENCOUNTER — Encounter: Payer: Self-pay | Admitting: Physician Assistant

## 2013-05-15 VITALS — BP 130/80 | HR 52 | Ht 68.0 in | Wt 170.2 lb

## 2013-05-15 DIAGNOSIS — I10 Essential (primary) hypertension: Secondary | ICD-10-CM | POA: Diagnosis not present

## 2013-05-15 DIAGNOSIS — I2510 Atherosclerotic heart disease of native coronary artery without angina pectoris: Secondary | ICD-10-CM | POA: Insufficient documentation

## 2013-05-15 DIAGNOSIS — Z9861 Coronary angioplasty status: Secondary | ICD-10-CM | POA: Insufficient documentation

## 2013-05-15 DIAGNOSIS — E785 Hyperlipidemia, unspecified: Secondary | ICD-10-CM | POA: Insufficient documentation

## 2013-05-15 DIAGNOSIS — I739 Peripheral vascular disease, unspecified: Secondary | ICD-10-CM

## 2013-05-15 NOTE — Patient Instructions (Signed)
Followup with Dr. Allyson Sabal in 6 months. Please call if you need assistance any sooner.

## 2013-05-15 NOTE — Assessment & Plan Note (Signed)
Blood pressure well controlled at this time. 

## 2013-05-15 NOTE — Assessment & Plan Note (Signed)
Last nuclear stress test was 02/15/2013 there was no reversible ischemia there was an intense defect in the lateral wall consistent with known left circumflex territory scar. EF was 54% normal wall motion. His son appropriate medications at this time.

## 2013-05-15 NOTE — Assessment & Plan Note (Signed)
*  Panel May 2013 total cholesterol 134 triglycerides 87 HDL 53 LDL 64. The patient continues on statin therapy

## 2013-05-15 NOTE — Progress Notes (Signed)
Date:  05/15/2013   ID:  Marca Ancona, DOB 1929/11/20, MRN [id number]  PCP:  Daisy Floro, MD  Primary Cardiologist:  Allyson Sabal    History of Present Illness: Donald Todd is a 79 y.o. male  history coronary artery disease, peripheral arterial disease with 40% ostial right renal artery stenosis, hyperlipidemia, hypertension. Patient's has history of RCA and ramus branch stenting by Dr. Gery Pray in 1997. His last cath was January 2005 revealed patent stent with 60% mid LAD lesion and normal LV function.  Patient has a nuclear stress test in March of 2014 which revealed moderate size intense defect in the lateral wall consistent with known left circumflex territory scar. There is no reversible ischemia. EF 54%.  He presents today for six-month followup he has no complaints at this time.  The patient currently denies nausea, vomiting, fever, chest pain, shortness of breath, orthopnea, dizziness, PND, cough, congestion, abdominal pain, hematochezia, melena, lower extremity edema, claudication.    Wt Readings from Last 3 Encounters:  05/15/13 170 lb 3.2 oz (77.202 kg)  04/25/13 171 lb 6.4 oz (77.747 kg)  03/20/13 178 lb (80.74 kg)     Past Medical History  Diagnosis Date  . Hypertension     renal dopplers 218.13-normal patency  . Chronic kidney disease   . Prostate disease     Cancer S/P seed implant  . Arthritis   . Inguinal hernia 08/01/11    right  . Heart attack 1993  . H/O unilateral nephrectomy     lt. nephrectomy  . CAD (coronary artery disease)     last cath. 07-09-2010  . Hyperlipemia   . H/O cardiovascular stress test 08-04-2010    ef 55% NO ISCHEMIA  . Cancer     kidney cancer , prostate cancer     Current Outpatient Prescriptions  Medication Sig Dispense Refill  . acyclovir (ZOVIRAX) 800 MG tablet Take 800 mg by mouth daily. Takes a total of 5 tablets daily .  Starts at 0700 and takes one tablet every 3 hours  For a total of 5 tablets daily.      [name] amLODipine  (NORVASC) 2.5 MG tablet Take 7.5 mg by mouth every morning. Patient taking 7.5 mg total per dosage      . amoxicillin (AMOXIL) 500 MG capsule Take 2,000 mg by mouth as needed. One hour before dental procedure      . aspirin 81 MG tablet Take 81 mg by mouth daily.       [name] atorvastatin (LIPITOR) 40 MG tablet Take 40 mg by mouth every evening.       . enalapril (VASOTEC) 10 MG tablet Take 10 mg by mouth every morning.       [name] HYDROcodone-acetaminophen (NORCO/VICODIN) 5-325 MG per tablet Take 1-2 tablets by mouth every 4 (four) hours as needed for pain.  50 tablet  1  . hydrocortisone 1 % ointment Apply topically daily. For itching ankles and arms      . isosorbide mononitrate (IMDUR) 30 MG 24 hr tablet Take 30 mg by mouth daily. Patient takes 3 tablets (90mg ) in the am and 1 tablet in the evening      . metoprolol succinate (TOPROL-XL) 50 MG 24 hr tablet Take 50 mg by mouth 2 (two) times daily. Take with or immediately following a meal.      . Multiple Vitamins-Minerals (OCUVITE PO) Take 1 capsule by mouth 2 (two) times daily.       . nitroGLYCERIN (NITROSTAT)  0.4 MG SL tablet Place 0.4 mg under the tongue as needed.        [name] omeprazole (PRILOSEC) 20 MG capsule Take 20 mg by mouth daily.       [name] oxybutynin (DITROPAN-XL) 5 MG 24 hr tablet Take 5 mg by mouth daily. Patient takes at nite       No current facility-administered medications for this visit.    Allergies:    Allergies  Allergen Reactions  . Tape Hives  . Iohexol      Desc: PT DOES RECALL REACTION JUST SAYS IT WAS A LONG TIME AGO FOR KIDNEY X-RAYS AND HE DIDN'T TOLERATE IT WELL-ARS 05/02/09-NOTE E-CHART STATES A HOT FEELING IS HIS REACTION, BUT HE CAN'T CONFIRM THAT WAS ALL   . Oxycodone Other (See Comments)    Shaking uncontrollably     Social History:  The patient  reports that he has quit smoking. His smoking use included Cigars and Cigarettes. He smoked 0.00 packs per day. He has never used smokeless tobacco. He reports that he  does not drink alcohol or use illicit drugs.   ROS:  Please see the history of present illness.  All other systems reviewed and negative.   PHYSICAL EXAM: VS:  BP 130/80  Pulse 52  Ht 5\' 8"  (1.727 m)  Wt 170 lb 3.2 oz (77.202 kg)  BMI 25.88 kg/m2 Well nourished, well developed, in no acute distress HEENT: Pupils are equal round react to light accommodation extraocular movements are intact.  Neck: no JVDNo cervical lymphadenopathy. Cardiac: Regular rate and rhythm without murmurs rubs or gallops. Lungs:  clear to auscultation bilaterally, no wheezing, rhonchi or rales Abd: soft, nontender, positive bowel sounds all quadrants, no hepatosplenomegaly Ext: no lower extremity edema.  2+ radial and dorsalis pedis pulses. Skin: warm and dry Neuro:  Grossly normal  EKG:  Sinus bradycardia left anterior fascicular block rate 50 beats per minute.  ASSESSMENT AND PLAN:  Problem List Items Addressed This Visit   Coronary artery disease     Last nuclear stress test was 02/15/2013 there was no reversible ischemia there was an intense defect in the lateral wall consistent with known left circumflex territory scar. EF was 54% normal wall motion. His son appropriate medications at this time.    Essential hypertension     Blood pressure well controlled at this time.    Hyperlipidemia     *Panel May 2013 total cholesterol 134 triglycerides 87 HDL 53 LDL 64. The patient continues on statin therapy    Peripheral arterial disease    Other Visit Diagnoses   CAD (coronary artery disease)    -  Primary    Relevant Orders       EKG 12-Lead       EKG 12-Lead

## 2013-05-16 ENCOUNTER — Other Ambulatory Visit (HOSPITAL_COMMUNITY): Payer: Self-pay | Admitting: Cardiovascular Disease

## 2013-05-17 ENCOUNTER — Other Ambulatory Visit (HOSPITAL_COMMUNITY): Payer: Self-pay | Admitting: Cardiovascular Disease

## 2013-06-06 DIAGNOSIS — H35359 Cystoid macular degeneration, unspecified eye: Secondary | ICD-10-CM | POA: Diagnosis not present

## 2013-06-06 DIAGNOSIS — H353190 Nonexudative age-related macular degeneration, unspecified eye, stage unspecified: Secondary | ICD-10-CM | POA: Diagnosis not present

## 2013-06-06 DIAGNOSIS — H353290 Exudative age-related macular degeneration, unspecified eye, stage unspecified: Secondary | ICD-10-CM | POA: Diagnosis not present

## 2013-07-01 DIAGNOSIS — I10 Essential (primary) hypertension: Secondary | ICD-10-CM | POA: Diagnosis not present

## 2013-07-01 DIAGNOSIS — Z0000 Encounter for general adult medical examination without abnormal findings: Secondary | ICD-10-CM | POA: Diagnosis not present

## 2013-07-01 DIAGNOSIS — E7800 Pure hypercholesterolemia, unspecified: Secondary | ICD-10-CM | POA: Diagnosis not present

## 2013-07-22 DIAGNOSIS — D485 Neoplasm of uncertain behavior of skin: Secondary | ICD-10-CM | POA: Diagnosis not present

## 2013-07-22 DIAGNOSIS — L259 Unspecified contact dermatitis, unspecified cause: Secondary | ICD-10-CM | POA: Diagnosis not present

## 2013-07-22 DIAGNOSIS — C44211 Basal cell carcinoma of skin of unspecified ear and external auricular canal: Secondary | ICD-10-CM | POA: Diagnosis not present

## 2013-07-24 DIAGNOSIS — L600 Ingrowing nail: Secondary | ICD-10-CM | POA: Diagnosis not present

## 2013-07-24 DIAGNOSIS — L89899 Pressure ulcer of other site, unspecified stage: Secondary | ICD-10-CM | POA: Diagnosis not present

## 2013-07-24 DIAGNOSIS — L03039 Cellulitis of unspecified toe: Secondary | ICD-10-CM | POA: Diagnosis not present

## 2013-07-24 DIAGNOSIS — M2040 Other hammer toe(s) (acquired), unspecified foot: Secondary | ICD-10-CM | POA: Diagnosis not present

## 2013-07-24 DIAGNOSIS — L8991 Pressure ulcer of unspecified site, stage 1: Secondary | ICD-10-CM | POA: Diagnosis not present

## 2013-07-31 DIAGNOSIS — L89899 Pressure ulcer of other site, unspecified stage: Secondary | ICD-10-CM | POA: Diagnosis not present

## 2013-07-31 DIAGNOSIS — L8991 Pressure ulcer of unspecified site, stage 1: Secondary | ICD-10-CM | POA: Diagnosis not present

## 2013-08-12 DIAGNOSIS — E559 Vitamin D deficiency, unspecified: Secondary | ICD-10-CM | POA: Diagnosis not present

## 2013-08-19 DIAGNOSIS — M899 Disorder of bone, unspecified: Secondary | ICD-10-CM | POA: Diagnosis not present

## 2013-08-19 DIAGNOSIS — N289 Disorder of kidney and ureter, unspecified: Secondary | ICD-10-CM | POA: Diagnosis not present

## 2013-08-19 DIAGNOSIS — C649 Malignant neoplasm of unspecified kidney, except renal pelvis: Secondary | ICD-10-CM | POA: Diagnosis not present

## 2013-08-19 DIAGNOSIS — C61 Malignant neoplasm of prostate: Secondary | ICD-10-CM | POA: Diagnosis not present

## 2013-10-06 ENCOUNTER — Other Ambulatory Visit (HOSPITAL_COMMUNITY): Payer: Self-pay | Admitting: Cardiovascular Disease

## 2013-10-06 DIAGNOSIS — I10 Essential (primary) hypertension: Secondary | ICD-10-CM | POA: Diagnosis not present

## 2013-10-07 NOTE — Telephone Encounter (Signed)
Rx was sent to pharmacy electronically. 

## 2013-10-28 DIAGNOSIS — Z85828 Personal history of other malignant neoplasm of skin: Secondary | ICD-10-CM | POA: Diagnosis not present

## 2013-10-28 DIAGNOSIS — R21 Rash and other nonspecific skin eruption: Secondary | ICD-10-CM | POA: Diagnosis not present

## 2013-12-13 ENCOUNTER — Encounter: Payer: Self-pay | Admitting: Cardiovascular Disease

## 2013-12-13 ENCOUNTER — Ambulatory Visit (INDEPENDENT_AMBULATORY_CARE_PROVIDER_SITE_OTHER): Payer: Medicare Other | Admitting: Cardiovascular Disease

## 2013-12-13 VITALS — BP 156/88 | HR 50 | Ht 67.0 in | Wt 174.5 lb

## 2013-12-13 DIAGNOSIS — E785 Hyperlipidemia, unspecified: Secondary | ICD-10-CM

## 2013-12-13 DIAGNOSIS — I10 Essential (primary) hypertension: Secondary | ICD-10-CM | POA: Diagnosis not present

## 2013-12-13 DIAGNOSIS — I739 Peripheral vascular disease, unspecified: Secondary | ICD-10-CM | POA: Diagnosis not present

## 2013-12-13 DIAGNOSIS — I2510 Atherosclerotic heart disease of native coronary artery without angina pectoris: Secondary | ICD-10-CM | POA: Diagnosis not present

## 2013-12-13 NOTE — Assessment & Plan Note (Signed)
On statin therapy followed by his PCP 

## 2013-12-13 NOTE — Progress Notes (Signed)
12/13/2013 Donald Todd   [birth year]  [id number]  Primary Physician  Donald Crutch, MD Primary Cardiologist: Donald Harp MD Donald Todd    HPI:  The patient returns today for a 60-month followup. He is an 80 year old thin-appearing married Caucasian male, father of 66, grandfather to 7 grandchildren, who I last saw 6 months ago. He has a history of RCA and ramus branch stenting by myself back in 1997. I catheterized him, December 22, 2003, revealing patent stent with a 60% mid LAD lesion, normal LV function. He did have a 40% ostial right renal artery stenosis with a known left nephrectomy. His last Myoview, performed August 04, 2010, revealed lateral scar, and catheterization performed several weeks prior to that revealed patent stents with noncritical CAD. He has remained asymptomatic. His most recent lipid profile performed in May revealed a total cholesterol of 134, LDL of 64, and HDL of 53.he saw Tenny Craw PAC back in the office 05/15/13 and has been doing well since. He did have a Myoview stress test performed in March that showed dense lateral scar done for preoperative clearance before elective left hernia repair. He is completely asymptomatic. Dr. Harrington Challenger follows his lipid profile   Current Outpatient Prescriptions  Medication Sig Dispense Refill  . amLODipine (NORVASC) 2.5 MG tablet TAKE 3 TABLETS BY MOUTH EVERY DAY  270 tablet  3  . amoxicillin (AMOXIL) 500 MG capsule Take 2,000 mg by mouth as needed. One hour before dental procedure      . aspirin 81 MG tablet Take 81 mg by mouth daily.       [name] atorvastatin (LIPITOR) 40 MG tablet TAKE 1 TABLET DAILY AT BEDTIME  90 tablet  2  . enalapril (VASOTEC) 10 MG tablet Take 10 mg by mouth every morning.       . isosorbide mononitrate (IMDUR) 30 MG 24 hr tablet TAKE 3 TABLETS BY MOUTH EVERY MORNING AND 1 TABLET EVERY EVENING  360 tablet  3  . metoprolol succinate (TOPROL-XL) 50 MG 24 hr tablet TAKE 1 TABLET BY MOUTH TWICE DAILY   180 tablet  2  . Multiple Vitamins-Minerals (OCUVITE PO) Take 1 capsule by mouth 2 (two) times daily.       . nitroGLYCERIN (NITROSTAT) 0.4 MG SL tablet Place 0.4 mg under the tongue as needed.        [name] omeprazole (PRILOSEC) 20 MG capsule Take 20 mg by mouth daily.       [name] oxybutynin (DITROPAN-XL) 5 MG 24 hr tablet Take 5 mg by mouth daily. Patient takes at nite      . triamcinolone cream (KENALOG) 0.1 % Apply 1 application topically 2 (two) times daily as needed.       No current facility-administered medications for this visit.    Allergies  Allergen Reactions  . Tape Hives  . Iohexol      Desc: PT DOES RECALL REACTION JUST SAYS IT WAS A LONG TIME AGO FOR KIDNEY X-RAYS AND HE DIDN'T TOLERATE IT WELL-ARS 05/02/09-NOTE E-CHART STATES A HOT FEELING IS HIS REACTION, BUT HE CAN'T CONFIRM THAT WAS ALL   . Oxycodone Other (See Comments)    Shaking uncontrollably     History   Social History  . Marital Status: Married    Spouse Name: N/A    Number of Children: N/A  . Years of Education: N/A   Occupational History  . Not on file.   Social History Main Topics  . Smoking status: Former Smoker  Types: Cigarettes, Cigars    Quit date: 12/14/1963  . Smokeless tobacco: Never Used  . Alcohol Use: No  . Drug Use: No  . Sexual Activity: Not on file   Other Topics Concern  . Not on file   Social History Narrative  . No narrative on file     Review of Systems: General: negative for chills, fever, night sweats or weight changes.  Cardiovascular: negative for chest pain, dyspnea on exertion, edema, orthopnea, palpitations, paroxysmal nocturnal dyspnea or shortness of breath Dermatological: negative for rash Respiratory: negative for cough or wheezing Urologic: negative for hematuria Abdominal: negative for nausea, vomiting, diarrhea, bright red blood per rectum, melena, or hematemesis Neurologic: negative for visual changes, syncope, or dizziness All other systems reviewed and are  otherwise negative except as noted above.    Blood pressure 156/88, pulse 50, height 5\' 7"  (1.702 m), weight 174 lb 8 oz (79.153 kg).  General appearance: alert and no distress Neck: no adenopathy, no carotid bruit, no JVD, supple, symmetrical, trachea midline and thyroid not enlarged, symmetric, no tenderness/mass/nodules Lungs: clear to auscultation bilaterally Heart: regular rate and rhythm, S1, S2 normal, no murmur, click, rub or gallop Extremities: extremities normal, atraumatic, no cyanosis or edema  EKG sinus bradycardia at 50 with nonspecific ST-T wave changes and left anterior fascicular block. There was T-wave inversion in leads 1 and L. Unchanged from prior EKGs  ASSESSMENT AND PLAN:   Coronary artery disease Status post RCA and is ramus branch stenting by myself back in 1997. I recatheterized him 1/7 2005 revealing patent stents, 60% mid LAD lesion involving function. He did have a 40% ostial right renal artery stenosis at that time with no left nephrectomy. They get a Myoview stress test performed/14/14 revealed a dense lateral scar with an EF of 54%. He is asymptomatic.  Essential hypertension Controlled on current medications  Hyperlipidemia On statin therapy followed by his PCP  Peripheral arterial disease History of 40% right renal artery stenosis and left nephrectomy. We've been following his renal Doppler studies which show widely patent right renal artery.      Donald Harp MD FACP,FACC,FAHA, [facility] 12/13/2013 8:03 AM

## 2013-12-13 NOTE — Patient Instructions (Signed)
Your physician wants you to follow-up in: 6 months with an extender and 1 year with Dr Berry. You will receive a reminder letter in the mail two months in advance. If you don't receive a letter, please call our office to schedule the follow-up appointment.  

## 2013-12-13 NOTE — Assessment & Plan Note (Signed)
Controlled on current medications 

## 2013-12-13 NOTE — Assessment & Plan Note (Signed)
History of 40% right renal artery stenosis and left nephrectomy. We've been following his renal Doppler studies which show widely patent right renal artery.

## 2013-12-13 NOTE — Assessment & Plan Note (Signed)
Status post RCA and is ramus branch stenting by myself back in 1997. I recatheterized him 1/7 2005 revealing patent stents, 60% mid LAD lesion involving function. He did have a 40% ostial right renal artery stenosis at that time with no left nephrectomy. They get a Myoview stress test performed/14/14 revealed a dense lateral scar with an EF of 54%. He is asymptomatic.

## 2013-12-23 DIAGNOSIS — R109 Unspecified abdominal pain: Secondary | ICD-10-CM | POA: Diagnosis not present

## 2013-12-23 DIAGNOSIS — R21 Rash and other nonspecific skin eruption: Secondary | ICD-10-CM | POA: Diagnosis not present

## 2013-12-30 ENCOUNTER — Other Ambulatory Visit: Payer: Self-pay | Admitting: Internal Medicine

## 2014-01-12 ENCOUNTER — Other Ambulatory Visit: Payer: Self-pay | Admitting: Cardiovascular Disease

## 2014-02-28 ENCOUNTER — Other Ambulatory Visit: Payer: Self-pay | Admitting: Cardiovascular Disease

## 2014-02-28 NOTE — Telephone Encounter (Signed)
Rx was sent to pharmacy electronically. 

## 2014-03-12 DIAGNOSIS — J209 Acute bronchitis, unspecified: Secondary | ICD-10-CM | POA: Diagnosis not present

## 2014-04-30 ENCOUNTER — Telehealth: Payer: Self-pay | Admitting: Cardiovascular Disease

## 2014-05-09 NOTE — Telephone Encounter (Signed)
Closed encounter °

## 2014-05-12 ENCOUNTER — Other Ambulatory Visit: Payer: Self-pay | Admitting: Cardiovascular Disease

## 2014-05-12 NOTE — Telephone Encounter (Signed)
Rx was sent to pharmacy electronically. 

## 2014-05-13 ENCOUNTER — Other Ambulatory Visit: Payer: Self-pay | Admitting: Cardiovascular Disease

## 2014-05-13 NOTE — Telephone Encounter (Signed)
Rx was sent to pharmacy electronically. 

## 2014-05-19 DIAGNOSIS — C61 Malignant neoplasm of prostate: Secondary | ICD-10-CM | POA: Diagnosis not present

## 2014-05-22 ENCOUNTER — Encounter: Payer: Self-pay | Admitting: Cardiology

## 2014-05-22 ENCOUNTER — Telehealth: Payer: Self-pay | Admitting: Cardiology

## 2014-05-23 NOTE — Telephone Encounter (Signed)
Closed encounter °

## 2014-05-27 ENCOUNTER — Other Ambulatory Visit (HOSPITAL_COMMUNITY): Payer: Self-pay | Admitting: Cardiovascular Disease

## 2014-05-27 NOTE — Telephone Encounter (Signed)
Rx was sent to pharmacy electronically. 

## 2014-06-05 DIAGNOSIS — Z961 Presence of intraocular lens: Secondary | ICD-10-CM | POA: Diagnosis not present

## 2014-06-05 DIAGNOSIS — H35359 Cystoid macular degeneration, unspecified eye: Secondary | ICD-10-CM | POA: Diagnosis not present

## 2014-06-05 DIAGNOSIS — H353290 Exudative age-related macular degeneration, unspecified eye, stage unspecified: Secondary | ICD-10-CM | POA: Diagnosis not present

## 2014-06-16 ENCOUNTER — Encounter: Payer: Self-pay | Admitting: Cardiology

## 2014-06-16 ENCOUNTER — Ambulatory Visit (INDEPENDENT_AMBULATORY_CARE_PROVIDER_SITE_OTHER): Payer: Medicare Other | Admitting: Cardiology

## 2014-06-16 VITALS — BP 138/67 | HR 55 | Ht 68.0 in | Wt 173.8 lb

## 2014-06-16 DIAGNOSIS — I10 Essential (primary) hypertension: Secondary | ICD-10-CM | POA: Diagnosis not present

## 2014-06-16 DIAGNOSIS — E785 Hyperlipidemia, unspecified: Secondary | ICD-10-CM | POA: Diagnosis not present

## 2014-06-16 DIAGNOSIS — Z905 Acquired absence of kidney: Secondary | ICD-10-CM | POA: Diagnosis not present

## 2014-06-16 DIAGNOSIS — N183 Chronic kidney disease, stage 3 unspecified: Secondary | ICD-10-CM

## 2014-06-16 DIAGNOSIS — I2510 Atherosclerotic heart disease of native coronary artery without angina pectoris: Secondary | ICD-10-CM | POA: Diagnosis not present

## 2014-06-16 NOTE — Assessment & Plan Note (Signed)
Last SCr 1.44 April 2014

## 2014-06-16 NOTE — Assessment & Plan Note (Signed)
Last cath was August 2011- patent stents

## 2014-06-16 NOTE — Assessment & Plan Note (Signed)
Dr Harrington Challenger follows

## 2014-06-16 NOTE — Progress Notes (Signed)
06/16/2014 Donald Todd   1928-12-13  [id number]  Primary Physicia  Melinda Crutch, MD Primary Cardiologist: Dr Gwenlyn Found  HPI:  The pt is an 26-year male, father of 59, grandfather to 7 grandchildren, who is followed by Dr Gwenlyn Found. 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 He has a history of an RCA and ramus branch stenting back in 1997. His last cath was in August 2011 and both sites were patent. He has a history of a  left nephrectomy. He has stage 3 CRI, his last SCr was 1.44 in April 2014. He is followed by Dr Harrington Challenger. He has remained asymptomatic. His most recent lipid profile performed in May revealed a total cholesterol of 124, LDL of 62, and HDL of 39.      Current Outpatient Prescriptions  Medication Sig Dispense Refill  . amLODipine (NORVASC) 2.5 MG tablet TAKE 3 TABLETS DAILY  270 tablet  1  . amoxicillin (AMOXIL) 500 MG capsule Take 2,000 mg by mouth as needed. One hour before dental procedure      . aspirin 81 MG tablet Take 81 mg by mouth daily.       [name] atorvastatin (LIPITOR) 40 MG tablet TAKE 1 TABLET AT BEDTIME  90 tablet  2  . enalapril (VASOTEC) 10 MG tablet TAKE 1 TABLET DAILY  90 tablet  3  . hydrOXYzine (ATARAX/VISTARIL) 25 MG tablet Take 25 mg by mouth every evening.      . isosorbide mononitrate (IMDUR) 30 MG 24 hr tablet TAKE 3 TABLETS EVERY MORNING AND 1 TABLET EVERY EVENING  360 tablet  2  . loratadine (CLARITIN) 10 MG tablet Take 10 mg by mouth daily.      . metoprolol succinate (TOPROL-XL) 50 MG 24 hr tablet TAKE 1 TABLET TWICE A DAY  180 tablet  1  . Multiple Vitamins-Minerals (OCUVITE PO) Take 1 capsule by mouth 2 (two) times daily.       . nitroGLYCERIN (NITROSTAT) 0.4 MG SL tablet Place 0.4 mg under the tongue as needed.        [name] omeprazole (PRILOSEC) 20 MG capsule Take 20 mg by mouth daily.       [name] oxybutynin (DITROPAN-XL) 5 MG 24 hr tablet Take 5 mg by mouth daily. Patient takes at nite       No current facility-administered medications for this visit.     Allergies  Allergen Reactions  . Tape Hives  . Iohexol      Desc: PT DOES RECALL REACTION JUST SAYS IT WAS A LONG TIME AGO FOR KIDNEY X-RAYS AND HE DIDN'T TOLERATE IT WELL-ARS 05/02/09-NOTE E-CHART STATES A HOT FEELING IS HIS REACTION, BUT HE CAN'T CONFIRM THAT WAS ALL   . Oxycodone Other (See Comments)    Shaking uncontrollably     History   Social History  . Marital Status: Married    Spouse Name: N/A    Number of Children: N/A  . Years of Education: N/A   Occupational History  . Not on file.   Social History Main Topics  . Smoking status: Former Smoker    Types: Cigarettes, Cigars    Quit date: 12/14/1963  . Smokeless tobacco: Never Used  . Alcohol Use: No  . Drug Use: No  . Sexual Activity: Not on file   Other Topics Concern  . Not on file   Social History Narrative  . No narrative on file     Review of Systems: General: negative for chills, fever, night sweats or weight  changes.  Cardiovascular: negative for chest pain, dyspnea on exertion, edema, orthopnea, palpitations, paroxysmal nocturnal dyspnea or shortness of breath Dermatological: negative for rash Respiratory: negative for cough or wheezing Urologic: negative for hematuria Abdominal: negative for nausea, vomiting, diarrhea, bright red blood per rectum, melena, or hematemesis Neurologic: negative for visual changes, syncope, or dizziness All other systems reviewed and are otherwise negative except as noted above.    Blood pressure 138/67, pulse 55, height 5\' 8"  (1.727 m), weight 173 lb 12.8 oz (78.835 kg).  General appearance: alert, cooperative and no distress Neck: no carotid bruit and no JVD Lungs: clear to auscultation bilaterally Heart: regular rate and rhythm  EKG NSR SB  ASSESSMENT AND PLAN:   CAD-RCA/RI PCI in '97 Last cath was August 2011- patent stents  Essential hypertension Controlled  Hyperlipidemia Dr Harrington Challenger follows  Chronic renal impairment, stage 3 (moderate) Last  SCr 1.44 April 2014   PLAN  Mr Gewirtz is doing well. He is scheduled to see Dr Harrington Challenger in September. His EKG show NSR, SB at 55, he is asymptomatic. I will see if we can get more recent labs from Dr Harrington Challenger.   Donald Todd KPA-C 06/16/2014 10:17 AM

## 2014-06-16 NOTE — Assessment & Plan Note (Signed)
Controlled.  

## 2014-06-19 ENCOUNTER — Ambulatory Visit: Payer: Medicare Other | Admitting: Cardiology

## 2014-06-19 DIAGNOSIS — Z961 Presence of intraocular lens: Secondary | ICD-10-CM | POA: Diagnosis not present

## 2014-06-19 DIAGNOSIS — H353290 Exudative age-related macular degeneration, unspecified eye, stage unspecified: Secondary | ICD-10-CM | POA: Diagnosis not present

## 2014-06-24 ENCOUNTER — Telehealth: Payer: Self-pay | Admitting: Cardiovascular Disease

## 2014-06-24 MED ORDER — METOPROLOL SUCCINATE ER 25 MG PO TB24: 25.0000 mg | ORAL_TABLET | Freq: Two times a day (BID) | ORAL | Status: DC

## 2014-06-24 NOTE — Telephone Encounter (Signed)
Discussed with brittney simmons pa, pt instructed to decrease metoprolol to 25 mg twice daily Patient voiced understanding  He will call with further problems.

## 2014-06-24 NOTE — Telephone Encounter (Signed)
Donald Todd is calling because he saw [name] on 06/16/14 and was told to call if his heart rate was to drop may need to adjust his medication if it happen again  and this morning his heart rate was 55.[name] Please Call    Thanks

## 2014-06-24 NOTE — Telephone Encounter (Signed)
Spoke with pt, when he saw luke he was told to call if his heart rate got below 55. This am after getting up his bpm was 47. After taking a 20 min walk it was 51. He is currently taking 50 mg of metoprolol bid Will discuss with dr berry.

## 2014-08-13 ENCOUNTER — Ambulatory Visit (HOSPITAL_COMMUNITY)
Admission: RE | Admit: 2014-08-13 | Discharge: 2014-08-13 | Disposition: A | Discharge status: Home / Self Care | Payer: Medicare Other | Source: Ambulatory Visit | Attending: Diagnostic Radiology | Admitting: Diagnostic Radiology

## 2014-08-13 ENCOUNTER — Other Ambulatory Visit (HOSPITAL_COMMUNITY): Payer: Self-pay | Admitting: Urology

## 2014-08-13 DIAGNOSIS — C649 Malignant neoplasm of unspecified kidney, except renal pelvis: Secondary | ICD-10-CM

## 2014-08-13 DIAGNOSIS — Z8553 Personal history of malignant neoplasm of renal pelvis: Secondary | ICD-10-CM | POA: Diagnosis not present

## 2014-08-13 DIAGNOSIS — C61 Malignant neoplasm of prostate: Secondary | ICD-10-CM | POA: Diagnosis not present

## 2014-08-13 DIAGNOSIS — E559 Vitamin D deficiency, unspecified: Secondary | ICD-10-CM | POA: Diagnosis not present

## 2014-08-20 DIAGNOSIS — R351 Nocturia: Secondary | ICD-10-CM | POA: Diagnosis not present

## 2014-08-20 DIAGNOSIS — M899 Disorder of bone, unspecified: Secondary | ICD-10-CM | POA: Diagnosis not present

## 2014-08-20 DIAGNOSIS — C61 Malignant neoplasm of prostate: Secondary | ICD-10-CM | POA: Diagnosis not present

## 2014-08-20 DIAGNOSIS — C649 Malignant neoplasm of unspecified kidney, except renal pelvis: Secondary | ICD-10-CM | POA: Diagnosis not present

## 2014-08-28 DIAGNOSIS — Z131 Encounter for screening for diabetes mellitus: Secondary | ICD-10-CM | POA: Diagnosis not present

## 2014-08-28 DIAGNOSIS — Z23 Encounter for immunization: Secondary | ICD-10-CM | POA: Diagnosis not present

## 2014-08-28 DIAGNOSIS — I10 Essential (primary) hypertension: Secondary | ICD-10-CM | POA: Diagnosis not present

## 2014-08-28 DIAGNOSIS — M25519 Pain in unspecified shoulder: Secondary | ICD-10-CM | POA: Diagnosis not present

## 2014-08-28 DIAGNOSIS — Z0000 Encounter for general adult medical examination without abnormal findings: Secondary | ICD-10-CM | POA: Diagnosis not present

## 2014-08-28 DIAGNOSIS — E7800 Pure hypercholesterolemia, unspecified: Secondary | ICD-10-CM | POA: Diagnosis not present

## 2014-08-29 DIAGNOSIS — Z79899 Other long term (current) drug therapy: Secondary | ICD-10-CM | POA: Diagnosis not present

## 2014-08-29 DIAGNOSIS — E7800 Pure hypercholesterolemia, unspecified: Secondary | ICD-10-CM | POA: Diagnosis not present

## 2014-08-29 DIAGNOSIS — M25519 Pain in unspecified shoulder: Secondary | ICD-10-CM | POA: Diagnosis not present

## 2014-08-29 DIAGNOSIS — Z131 Encounter for screening for diabetes mellitus: Secondary | ICD-10-CM | POA: Diagnosis not present

## 2014-08-29 DIAGNOSIS — I10 Essential (primary) hypertension: Secondary | ICD-10-CM | POA: Diagnosis not present

## 2014-11-10 ENCOUNTER — Telehealth: Payer: Self-pay | Admitting: Cardiovascular Disease

## 2014-11-10 MED ORDER — OMEPRAZOLE 20 MG PO CPDR: 20.0000 mg | DELAYED_RELEASE_CAPSULE | Freq: Every day | ORAL | Status: DC

## 2014-11-10 NOTE — Telephone Encounter (Signed)
Rx was sent to pharmacy electronically. 

## 2014-11-10 NOTE — Telephone Encounter (Signed)
Needs a new prscription sent to his pharmacy for a 90 day supply Priolsec any questions please call .[name] Thanks   Thanks

## 2014-12-02 ENCOUNTER — Other Ambulatory Visit: Payer: Self-pay | Admitting: Cardiovascular Disease

## 2014-12-02 NOTE — Telephone Encounter (Signed)
Rx(s) sent to pharmacy electronically.  

## 2014-12-17 ENCOUNTER — Encounter: Payer: Self-pay | Admitting: Cardiovascular Disease

## 2014-12-17 ENCOUNTER — Ambulatory Visit (INDEPENDENT_AMBULATORY_CARE_PROVIDER_SITE_OTHER): Payer: Medicare Other | Admitting: Cardiovascular Disease

## 2014-12-17 VITALS — BP 140/72 | Ht 67.0 in | Wt 170.7 lb

## 2014-12-17 DIAGNOSIS — I2510 Atherosclerotic heart disease of native coronary artery without angina pectoris: Secondary | ICD-10-CM | POA: Diagnosis not present

## 2014-12-17 DIAGNOSIS — E785 Hyperlipidemia, unspecified: Secondary | ICD-10-CM | POA: Diagnosis not present

## 2014-12-17 DIAGNOSIS — I10 Essential (primary) hypertension: Secondary | ICD-10-CM

## 2014-12-17 DIAGNOSIS — I2583 Coronary atherosclerosis due to lipid rich plaque: Secondary | ICD-10-CM

## 2014-12-17 MED ORDER — NITROGLYCERIN 0.4 MG SL SUBL: 0.4000 mg | SUBLINGUAL_TABLET | SUBLINGUAL | Status: DC | PRN

## 2014-12-17 NOTE — Assessment & Plan Note (Signed)
History of CAD status post RCA and ramus branch stenting by myself back in 1997. I catheterized him 12/22/03 revealing patent stents with a 50% mid LAD lesion normal LV function. He did have 40% ostial right renal artery stenosis status post left nephrectomy. His last Myoview performed 08/04/10 revealed lateral scar. He denies chest pain or shortness of breath.

## 2014-12-17 NOTE — Assessment & Plan Note (Signed)
History of hypertension with blood pressure measured at 140/72. He is on amlodipine and enalapril and metoprolol. Continue current meds at current dosing

## 2014-12-17 NOTE — Assessment & Plan Note (Signed)
History of hyperlipidemia on atorvastatin 40 mg a day followed by his PCP 

## 2014-12-17 NOTE — Patient Instructions (Signed)
Your physician wants you to follow-up in 1 year with Dr. Berry. You will receive a reminder letter in the mail 2 months in advance. If you do not receive a letter, please call our office to schedule the follow-up appointment.  

## 2014-12-17 NOTE — Progress Notes (Signed)
12/17/2014 Donald Todd   [birth year]  [id number]  Primary Physician  Melinda Crutch, MD Primary Cardiologist: Lorretta Harp MD [name]   HPI:  The patient returns today for a 55-month followup. He is an 81 year old thin-appearing married Caucasian male, father of 37, grandfather to 7 grandchildren, who I last saw 55months ago. He has a history of RCA and ramus branch stenting by myself back in 1997. I catheterized him, December 22, 2003, revealing patent stent with a 60% mid LAD lesion, normal LV function. He did have a 40% ostial right renal artery stenosis with a known left nephrectomy. His last Myoview, performed August 04, 2010, revealed lateral scar, and catheterization performed several weeks prior to that revealed patent stents with noncritical CAD. He has remained asymptomatic. He saw [name] PAC back in the office 06/16/14 and was completely asymptomatic. His lipid profile in May of that year revealed a total cholesterol 124, LDL 62 HDL of 39.Myoview stress test performed 02/15/13 moderate sized lateral wall scar consistent with his anatomy.  Current Outpatient Prescriptions  Medication Sig Dispense Refill  . amLODipine (NORVASC) 2.5 MG tablet TAKE 3 TABLETS DAILY 270 tablet 1  . amoxicillin (AMOXIL) 500 MG capsule Take 2,000 mg by mouth as needed. One hour before dental procedure    . aspirin 81 MG tablet Take 81 mg by mouth daily.     [name] atorvastatin (LIPITOR) 40 MG tablet TAKE 1 TABLET AT BEDTIME 90 tablet 2  . enalapril (VASOTEC) 10 MG tablet Take 1 tablet (10 mg total) by mouth daily. 90 tablet 0  . hydrOXYzine (ATARAX/VISTARIL) 25 MG tablet Take 25 mg by mouth every evening.    . isosorbide mononitrate (IMDUR) 30 MG 24 hr tablet TAKE 3 TABLETS EVERY MORNING AND 1 TABLET EVERY EVENING 360 tablet 2  . loratadine (CLARITIN) 10 MG tablet Take 10 mg by mouth daily.    . metoprolol succinate (TOPROL-XL) 25 MG 24 hr tablet Take 1 tablet (25 mg total) by mouth 2  (two) times daily. Take with or immediately following a meal. 180 tablet 3  . Multiple Vitamins-Minerals (OCUVITE PO) Take 1 capsule by mouth 2 (two) times daily.     . nitroGLYCERIN (NITROSTAT) 0.4 MG SL tablet Place 1 tablet (0.4 mg total) under the tongue as needed. 25 tablet 3  . omeprazole (PRILOSEC) 20 MG capsule Take 1 capsule (20 mg total) by mouth daily. 90 capsule 0   No current facility-administered medications for this visit.    Allergies  Allergen Reactions  . Tape Hives  . Iohexol      Desc: PT DOES RECALL REACTION JUST SAYS IT WAS A LONG TIME AGO FOR KIDNEY X-RAYS AND HE DIDN'T TOLERATE IT WELL-ARS 05/02/09-NOTE E-CHART STATES A HOT FEELING IS HIS REACTION, BUT HE CAN'T CONFIRM THAT WAS ALL   . Oxycodone Other (See Comments)    Shaking uncontrollably     History   Social History  . Marital Status: Married    Spouse Name: N/A    Number of Children: N/A  . Years of Education: N/A   Occupational History  . Not on file.   Social History Main Topics  . Smoking status: Former Smoker    Types: Cigarettes, Cigars    Quit date: 12/14/1963  . Smokeless tobacco: Never Used  . Alcohol Use: No  . Drug Use: No  . Sexual Activity: Not on file   Other Topics Concern  . Not on file   Social  History Narrative     Review of Systems: General: negative for chills, fever, night sweats or weight changes.  Cardiovascular: negative for chest pain, dyspnea on exertion, edema, orthopnea, palpitations, paroxysmal nocturnal dyspnea or shortness of breath Dermatological: negative for rash Respiratory: negative for cough or wheezing Urologic: negative for hematuria Abdominal: negative for nausea, vomiting, diarrhea, bright red blood per rectum, melena, or hematemesis Neurologic: negative for visual changes, syncope, or dizziness All other systems reviewed and are otherwise negative except as noted above.    Blood pressure 140/72, height 5\' 7"  (1.702 m), weight 170 lb 11.2 oz  (77.429 kg).  General appearance: alert and no distress Neck: no adenopathy, no carotid bruit, no JVD, supple, symmetrical, trachea midline and thyroid not enlarged, symmetric, no tenderness/mass/nodules Lungs: clear to auscultation bilaterally Heart: regular rate and rhythm, S1, S2 normal, no murmur, click, rub or gallop Extremities: extremities normal, atraumatic, no cyanosis or edema  EKG normal sinus rhythm at 69 without ST or T-wave changes. He does have left axis deviation. I personally reviewed this EKG  ASSESSMENT AND PLAN:   Hyperlipidemia History of hyperlipidemia on atorvastatin 40 mg a day followed by his PCP   Essential hypertension History of hypertension with blood pressure measured at 140/72. He is on amlodipine and enalapril and metoprolol. Continue current meds at current dosing   CAD-RCA/RI PCI in '97 History of CAD status post RCA and ramus branch stenting by myself back in 1997. I catheterized him 12/22/03 revealing patent stents with a 50% mid LAD lesion normal LV function. He did have 40% ostial right renal artery stenosis status post left nephrectomy. His last Myoview performed 08/04/10 revealed lateral scar. He denies chest pain or shortness of breath.       Lorretta Harp MD FACP,FACC,FAHA, [facility] 12/17/2014 2:34 PM

## 2014-12-22 DIAGNOSIS — L821 Other seborrheic keratosis: Secondary | ICD-10-CM | POA: Diagnosis not present

## 2014-12-22 DIAGNOSIS — D0462 Carcinoma in situ of skin of left upper limb, including shoulder: Secondary | ICD-10-CM | POA: Diagnosis not present

## 2014-12-22 DIAGNOSIS — D692 Other nonthrombocytopenic purpura: Secondary | ICD-10-CM | POA: Diagnosis not present

## 2015-01-05 ENCOUNTER — Other Ambulatory Visit: Payer: Self-pay | Admitting: Cardiovascular Disease

## 2015-01-05 NOTE — Telephone Encounter (Signed)
Rx refill sent to patient pharmacy   

## 2015-01-20 ENCOUNTER — Other Ambulatory Visit: Payer: Self-pay | Admitting: Cardiovascular Disease

## 2015-01-20 NOTE — Telephone Encounter (Signed)
Rx(s) sent to pharmacy electronically.  

## 2015-02-10 ENCOUNTER — Other Ambulatory Visit: Payer: Self-pay | Admitting: Cardiovascular Disease

## 2015-04-16 ENCOUNTER — Other Ambulatory Visit: Payer: Self-pay | Admitting: *Deleted

## 2015-04-16 MED ORDER — ATORVASTATIN CALCIUM 40 MG PO TABS: 40.0000 mg | ORAL_TABLET | Freq: Every day | ORAL | Status: DC

## 2015-04-22 DIAGNOSIS — Z08 Encounter for follow-up examination after completed treatment for malignant neoplasm: Secondary | ICD-10-CM | POA: Diagnosis not present

## 2015-04-22 DIAGNOSIS — L821 Other seborrheic keratosis: Secondary | ICD-10-CM | POA: Diagnosis not present

## 2015-04-22 DIAGNOSIS — Z85828 Personal history of other malignant neoplasm of skin: Secondary | ICD-10-CM | POA: Diagnosis not present

## 2015-04-22 DIAGNOSIS — L309 Dermatitis, unspecified: Secondary | ICD-10-CM | POA: Diagnosis not present

## 2015-05-11 ENCOUNTER — Other Ambulatory Visit: Payer: Self-pay | Admitting: Cardiovascular Disease

## 2015-05-11 NOTE — Telephone Encounter (Signed)
Rx has been sent to the pharmacy electronically. ° °

## 2015-05-13 ENCOUNTER — Other Ambulatory Visit: Payer: Self-pay | Admitting: Cardiovascular Disease

## 2015-05-13 NOTE — Telephone Encounter (Signed)
Rx(s) sent to pharmacy electronically.  

## 2015-06-11 DIAGNOSIS — Z961 Presence of intraocular lens: Secondary | ICD-10-CM | POA: Diagnosis not present

## 2015-06-11 DIAGNOSIS — H35351 Cystoid macular degeneration, right eye: Secondary | ICD-10-CM | POA: Diagnosis not present

## 2015-06-11 DIAGNOSIS — H3532 Exudative age-related macular degeneration: Secondary | ICD-10-CM | POA: Diagnosis not present

## 2015-07-02 DIAGNOSIS — H3532 Exudative age-related macular degeneration: Secondary | ICD-10-CM | POA: Diagnosis not present

## 2015-08-06 DIAGNOSIS — Z471 Aftercare following joint replacement surgery: Secondary | ICD-10-CM | POA: Diagnosis not present

## 2015-08-06 DIAGNOSIS — M545 Low back pain: Secondary | ICD-10-CM | POA: Diagnosis not present

## 2015-08-06 DIAGNOSIS — Z96653 Presence of artificial knee joint, bilateral: Secondary | ICD-10-CM | POA: Diagnosis not present

## 2015-08-12 DIAGNOSIS — H35351 Cystoid macular degeneration, right eye: Secondary | ICD-10-CM | POA: Diagnosis not present

## 2015-08-15 DIAGNOSIS — H3531 Nonexudative age-related macular degeneration: Secondary | ICD-10-CM | POA: Diagnosis not present

## 2015-09-13 ENCOUNTER — Other Ambulatory Visit: Payer: Self-pay | Admitting: Cardiovascular Disease

## 2015-09-14 ENCOUNTER — Other Ambulatory Visit: Payer: Self-pay

## 2015-09-14 DIAGNOSIS — I10 Essential (primary) hypertension: Secondary | ICD-10-CM | POA: Diagnosis not present

## 2015-09-14 DIAGNOSIS — Z23 Encounter for immunization: Secondary | ICD-10-CM | POA: Diagnosis not present

## 2015-09-14 DIAGNOSIS — E7800 Pure hypercholesterolemia, unspecified: Secondary | ICD-10-CM | POA: Diagnosis not present

## 2015-09-14 DIAGNOSIS — Z0000 Encounter for general adult medical examination without abnormal findings: Secondary | ICD-10-CM | POA: Diagnosis not present

## 2015-09-14 MED ORDER — ISOSORBIDE MONONITRATE ER 30 MG PO TB24: ORAL_TABLET | ORAL | Status: DC

## 2015-09-29 DIAGNOSIS — Z961 Presence of intraocular lens: Secondary | ICD-10-CM | POA: Diagnosis not present

## 2015-09-29 DIAGNOSIS — H353232 Exudative age-related macular degeneration, bilateral, with inactive choroidal neovascularization: Secondary | ICD-10-CM | POA: Diagnosis not present

## 2015-09-29 DIAGNOSIS — H35351 Cystoid macular degeneration, right eye: Secondary | ICD-10-CM | POA: Diagnosis not present

## 2015-10-19 DIAGNOSIS — Z85828 Personal history of other malignant neoplasm of skin: Secondary | ICD-10-CM | POA: Diagnosis not present

## 2015-10-19 DIAGNOSIS — Z08 Encounter for follow-up examination after completed treatment for malignant neoplasm: Secondary | ICD-10-CM | POA: Diagnosis not present

## 2015-10-19 DIAGNOSIS — L2084 Intrinsic (allergic) eczema: Secondary | ICD-10-CM | POA: Diagnosis not present

## 2015-10-20 DIAGNOSIS — E559 Vitamin D deficiency, unspecified: Secondary | ICD-10-CM | POA: Diagnosis not present

## 2015-10-20 DIAGNOSIS — C61 Malignant neoplasm of prostate: Secondary | ICD-10-CM | POA: Diagnosis not present

## 2015-10-26 DIAGNOSIS — C61 Malignant neoplasm of prostate: Secondary | ICD-10-CM | POA: Diagnosis not present

## 2015-10-26 DIAGNOSIS — C642 Malignant neoplasm of left kidney, except renal pelvis: Secondary | ICD-10-CM | POA: Diagnosis not present

## 2015-10-26 DIAGNOSIS — R351 Nocturia: Secondary | ICD-10-CM | POA: Diagnosis not present

## 2015-10-26 DIAGNOSIS — R3913 Splitting of urinary stream: Secondary | ICD-10-CM | POA: Diagnosis not present

## 2015-10-26 DIAGNOSIS — N401 Enlarged prostate with lower urinary tract symptoms: Secondary | ICD-10-CM | POA: Diagnosis not present

## 2015-12-21 DIAGNOSIS — N312 Flaccid neuropathic bladder, not elsewhere classified: Secondary | ICD-10-CM | POA: Diagnosis not present

## 2015-12-21 DIAGNOSIS — R3913 Splitting of urinary stream: Secondary | ICD-10-CM | POA: Diagnosis not present

## 2015-12-21 DIAGNOSIS — Z0000 Encounter for general adult medical examination without abnormal findings: Secondary | ICD-10-CM | POA: Diagnosis not present

## 2015-12-21 DIAGNOSIS — N401 Enlarged prostate with lower urinary tract symptoms: Secondary | ICD-10-CM | POA: Diagnosis not present

## 2015-12-25 ENCOUNTER — Encounter: Payer: Self-pay | Admitting: Cardiovascular Disease

## 2015-12-25 ENCOUNTER — Ambulatory Visit (INDEPENDENT_AMBULATORY_CARE_PROVIDER_SITE_OTHER): Payer: Medicare Other | Admitting: Cardiovascular Disease

## 2015-12-25 VITALS — BP 130/76 | HR 56 | Ht 67.0 in | Wt 169.0 lb

## 2015-12-25 DIAGNOSIS — I10 Essential (primary) hypertension: Secondary | ICD-10-CM | POA: Diagnosis not present

## 2015-12-25 DIAGNOSIS — I2510 Atherosclerotic heart disease of native coronary artery without angina pectoris: Secondary | ICD-10-CM | POA: Diagnosis not present

## 2015-12-25 DIAGNOSIS — I2583 Coronary atherosclerosis due to lipid rich plaque: Secondary | ICD-10-CM

## 2015-12-25 DIAGNOSIS — E785 Hyperlipidemia, unspecified: Secondary | ICD-10-CM

## 2015-12-25 NOTE — Progress Notes (Signed)
12/25/2015 Donald Todd   06/13/1929  [id number]  Primary Physician  Melinda Crutch, MD Primary Cardiologist: Lorretta Harp MD [name]   HPI:  The patient returns today for a 54-month followup. He is an 82 year old thin-appearing married Caucasian male, father of 70, grandfather to 7 grandchildren, who I last saw 12months ago. He has a history of RCA and ramus branch stenting by myself back in 1997. I catheterized him, December 22, 2003, revealing patent stent with a 60% mid LAD lesion, normal LV function. He did have a 40% ostial right renal artery stenosis with a known left nephrectomy. His last Myoview, performed August 04, 2010, revealed lateral scar, and catheterization performed several weeks prior to that revealed patent stents with noncritical CAD. He has remained asymptomatic. His last .Myoview stress test performed 02/15/13 moderate sized lateral wall scar consistent with his anatomy.his most recent lab work performed 09/14/15 by his PCP revealed total cholesterol 146, LDL of 66 and HDL of 57.   Current Outpatient Prescriptions  Medication Sig Dispense Refill  . amLODipine (NORVASC) 2.5 MG tablet TAKE 3 TABLETS DAILY 270 tablet 2  . amoxicillin (AMOXIL) 500 MG capsule Take 2,000 mg by mouth as needed. One hour before dental procedure    . aspirin 81 MG tablet Take 81 mg by mouth daily.     [name] atorvastatin (LIPITOR) 40 MG tablet Take 1 tablet (40 mg total) by mouth at bedtime. 90 tablet 3  . cholecalciferol (VITAMIN D) 1000 units tablet Take 1,000 Units by mouth daily.    . enalapril (VASOTEC) 10 MG tablet TAKE 1 TABLET DAILY 90 tablet 3  . hydrOXYzine (ATARAX/VISTARIL) 25 MG tablet Take 25 mg by mouth every evening.    . isosorbide mononitrate (IMDUR) 30 MG 24 hr tablet TAKE 3 TABLETS EVERY MORNING AND 1 TABLET EVERY EVENING 360 tablet 2  . loratadine (CLARITIN) 10 MG tablet Take 10 mg by mouth daily.    . Multiple Vitamins-Minerals (OCUVITE PO) Take 1 capsule by  mouth 2 (two) times daily.     . nitroGLYCERIN (NITROSTAT) 0.4 MG SL tablet Place 1 tablet (0.4 mg total) under the tongue as needed. 25 tablet 3  . omeprazole (PRILOSEC) 20 MG capsule Take 1 capsule (20 mg total) by mouth daily. 90 capsule 3  . TOPROL XL 25 MG 24 hr tablet TAKE 1 TABLET TWICE A DAY WITH OR IMMEDIATELY FOLLOWING A MEAL 180 tablet 2   No current facility-administered medications for this visit.    Allergies  Allergen Reactions  . Tape Hives  . Iohexol      Desc: PT DOES RECALL REACTION JUST SAYS IT WAS A LONG TIME AGO FOR KIDNEY X-RAYS AND HE DIDN'T TOLERATE IT WELL-ARS 05/02/09-NOTE E-CHART STATES A HOT FEELING IS HIS REACTION, BUT HE CAN'T CONFIRM THAT WAS ALL   . Oxycodone Other (See Comments)    Shaking uncontrollably     Social History   Social History  . Marital Status: Married    Spouse Name: N/A  . Number of Children: N/A  . Years of Education: N/A   Occupational History  . Not on file.   Social History Main Topics  . Smoking status: Former Smoker    Types: Cigarettes, Cigars    Quit date: 12/14/1963  . Smokeless tobacco: Never Used  . Alcohol Use: No  . Drug Use: No  . Sexual Activity: Not on file   Other Topics Concern  . Not on file   Social  History Narrative     Review of Systems: General: negative for chills, fever, night sweats or weight changes.  Cardiovascular: negative for chest pain, dyspnea on exertion, edema, orthopnea, palpitations, paroxysmal nocturnal dyspnea or shortness of breath Dermatological: negative for rash Respiratory: negative for cough or wheezing Urologic: negative for hematuria Abdominal: negative for nausea, vomiting, diarrhea, bright red blood per rectum, melena, or hematemesis Neurologic: negative for visual changes, syncope, or dizziness All other systems reviewed and are otherwise negative except as noted above.    Blood pressure 130/76, pulse 56, height 5\' 7"  (1.702 m), weight 169 lb (76.658 kg).    General appearance: alert and no distress Neck: no adenopathy, no carotid bruit, no JVD, supple, symmetrical, trachea midline and thyroid not enlarged, symmetric, no tenderness/mass/nodules Lungs: clear to auscultation bilaterally Heart: regular rate and rhythm, S1, S2 normal, no murmur, click, rub or gallop  EKG sinus bradycardia 56 with left axis deviation. I personally reviewed this EKG  ASSESSMENT AND PLAN:   CAD-RCA/RI PCI in '97 History of coronary artery disease status post RCA and ramus branch stenting by myself back in 1997. I recatheterized him 12/22/03 revealing patent stents with 60% mid LAD lesion and normal LV function. His last Myoview performed 02/15/13 showed a moderate sized lateral wall scar consistent with his anatomy. He denies chest pain or shortness of breath.  Essential hypertension History of hypertension blood pressure measured at 130/76. He is on amlodipine, enalapril and Toprol. Continue current meds at current dosing  Hyperlipidemia History of hyperlipidemia on atorvastatin with recent lipid profile performed by his PCP 09/14/15 revealing a total cholesterol of 46, LDL 66 and HDL of 57.      Lorretta Harp MD [facility], [facility] 12/25/2015 12:03 PM

## 2015-12-25 NOTE — Patient Instructions (Signed)

## 2015-12-25 NOTE — Assessment & Plan Note (Signed)
History of hyperlipidemia on atorvastatin with recent lipid profile performed by his PCP 09/14/15 revealing a total cholesterol of 46, LDL 66 and HDL of 57.

## 2015-12-25 NOTE — Assessment & Plan Note (Signed)
History of coronary artery disease status post RCA and ramus branch stenting by myself back in 1997. I recatheterized him 12/22/03 revealing patent stents with 60% mid LAD lesion and normal LV function. His last Myoview performed 02/15/13 showed a moderate sized lateral wall scar consistent with his anatomy. He denies chest pain or shortness of breath.

## 2015-12-25 NOTE — Assessment & Plan Note (Signed)
History of hypertension blood pressure measured at 130/76. He is on amlodipine, enalapril and Toprol. Continue current meds at current dosing

## 2015-12-28 DIAGNOSIS — H353232 Exudative age-related macular degeneration, bilateral, with inactive choroidal neovascularization: Secondary | ICD-10-CM | POA: Diagnosis not present

## 2016-01-14 ENCOUNTER — Other Ambulatory Visit: Payer: Self-pay | Admitting: Cardiovascular Disease

## 2016-01-14 NOTE — Telephone Encounter (Signed)
Rx(s) sent to pharmacy electronically.  

## 2016-02-06 ENCOUNTER — Other Ambulatory Visit: Payer: Self-pay | Admitting: Cardiovascular Disease

## 2016-02-08 NOTE — Telephone Encounter (Signed)
Rx(s) sent to pharmacy electronically.  

## 2016-02-22 ENCOUNTER — Other Ambulatory Visit: Payer: Self-pay | Admitting: Cardiovascular Disease

## 2016-02-22 NOTE — Telephone Encounter (Signed)
Rx request sent to pharmacy.  

## 2016-03-23 DIAGNOSIS — Z905 Acquired absence of kidney: Secondary | ICD-10-CM | POA: Diagnosis not present

## 2016-03-23 DIAGNOSIS — Z0000 Encounter for general adult medical examination without abnormal findings: Secondary | ICD-10-CM | POA: Diagnosis not present

## 2016-03-23 DIAGNOSIS — M545 Low back pain: Secondary | ICD-10-CM | POA: Diagnosis not present

## 2016-03-28 DIAGNOSIS — H353232 Exudative age-related macular degeneration, bilateral, with inactive choroidal neovascularization: Secondary | ICD-10-CM | POA: Diagnosis not present

## 2016-03-28 DIAGNOSIS — H35351 Cystoid macular degeneration, right eye: Secondary | ICD-10-CM | POA: Diagnosis not present

## 2016-03-28 DIAGNOSIS — Z961 Presence of intraocular lens: Secondary | ICD-10-CM | POA: Diagnosis not present

## 2016-04-18 DIAGNOSIS — Z85828 Personal history of other malignant neoplasm of skin: Secondary | ICD-10-CM | POA: Diagnosis not present

## 2016-04-18 DIAGNOSIS — Z08 Encounter for follow-up examination after completed treatment for malignant neoplasm: Secondary | ICD-10-CM | POA: Diagnosis not present

## 2016-04-18 DIAGNOSIS — L2084 Intrinsic (allergic) eczema: Secondary | ICD-10-CM | POA: Diagnosis not present

## 2016-06-14 ENCOUNTER — Other Ambulatory Visit: Payer: Self-pay | Admitting: Cardiovascular Disease

## 2016-06-21 DIAGNOSIS — C642 Malignant neoplasm of left kidney, except renal pelvis: Secondary | ICD-10-CM | POA: Diagnosis not present

## 2016-06-21 DIAGNOSIS — M25519 Pain in unspecified shoulder: Secondary | ICD-10-CM | POA: Diagnosis not present

## 2016-06-21 DIAGNOSIS — Z8546 Personal history of malignant neoplasm of prostate: Secondary | ICD-10-CM | POA: Diagnosis not present

## 2016-06-28 ENCOUNTER — Ambulatory Visit: Payer: Medicare Other | Attending: Family Medicine

## 2016-06-28 DIAGNOSIS — M25512 Pain in left shoulder: Secondary | ICD-10-CM | POA: Insufficient documentation

## 2016-06-28 DIAGNOSIS — R293 Abnormal posture: Secondary | ICD-10-CM | POA: Insufficient documentation

## 2016-06-28 DIAGNOSIS — M25612 Stiffness of left shoulder, not elsewhere classified: Secondary | ICD-10-CM | POA: Insufficient documentation

## 2016-07-01 ENCOUNTER — Ambulatory Visit: Payer: Medicare Other | Admitting: Physical Therapy

## 2016-07-01 DIAGNOSIS — R293 Abnormal posture: Secondary | ICD-10-CM | POA: Diagnosis not present

## 2016-07-01 DIAGNOSIS — M25512 Pain in left shoulder: Secondary | ICD-10-CM

## 2016-07-01 DIAGNOSIS — M25612 Stiffness of left shoulder, not elsewhere classified: Secondary | ICD-10-CM

## 2016-07-01 NOTE — Therapy (Signed)
[facility] [address]  [place], [place], [address] Phone: [phone]   Fax:  [phone]  Physical Therapy Evaluation  Patient Details  Name: Donald Todd MRN: [id number] Date of Birth: 1929/10/23 Referring Provider: Dr. Melinda Crutch  Encounter Date: 07/01/2016      PT End of Session - 07/01/16 1138    Visit Number 1   Number of Visits 12   Date for PT Re-Evaluation 08/12/16   Authorization Type [employer] - no PTA   PT Start Time 1100   PT Stop Time 1134   PT Time Calculation (min) 34 min   Activity Tolerance Patient tolerated treatment well   Behavior During Therapy [facility] for tasks assessed/performed      Past Medical History:  Diagnosis Date  . Arthritis   . CAD (coronary artery disease)    last cath. 07-09-2010  . Cancer [facility])    kidney cancer , prostate cancer   . Chronic kidney disease   . H/O cardiovascular stress test 08-04-2010   ef 55% NO ISCHEMIA  . H/O unilateral nephrectomy    lt. nephrectomy  . Heart attack ([place]) 1993  . Hyperlipemia   . Hypertension    renal dopplers 218.13-normal patency  . Inguinal hernia 08/01/11   right  . Prostate disease    Cancer S/P seed implant    Past Surgical History:  Procedure Laterality Date  . ANGIOPLASTY  1993  . athroscopic knee surgery  2002   right  . bone scan  2005  . CARDIAC CATHETERIZATION  07-09-2010   normal left ventricular function, coronary obsstructive disease with 20% proximal an 30-40% mid lt. anterior descending narrowing ; widely patent stent in the proximal ramus intermediate vessel; 50-70-% narrowing in the mid circumflex,and widely patent stent  in the rt. coronary   . CARDIAC CATHETERIZATION  12-22-2003   widely patent stents with noncritical coronary artery disease and normalLV function  . CHOLECYSTECTOMY  05/05/2009   Laparoscopic  . colonscopy  2004   with biopsy  . CORONARY ANGIOPLASTY WITH STENT PLACEMENT  03-15-1996   pci to  rci and ramus branch using j&j [id number] stent,post dialated  at  20atm (rca)  and 14 atm (ramus branch. ef 55%            . HERNIA REPAIR  08/01/11   RIH  . INGUINAL HERNIA REPAIR Left 03/29/2013   Procedure: HERNIA REPAIR INGUINAL ADULT;  Surgeon: Edward Jolly, MD;  Location: [facility];  Service: General;  Laterality: Left;  . INSERTION OF MESH Left 03/29/2013   Procedure: INSERTION OF MESH;  Surgeon: Edward Jolly, MD;  Location: [facility];  Service: General;  Laterality: Left;  . JOINT REPLACEMENT  2006 and 2010   left 2006, right 2010  . KIDNEY SURGERY  2002 and 2005   2002 - partial removal of left. 2005 complete removal  . PROSTATE BIOPSY    . prostate seed implant    . radiation treatment  2002   prostate  . stent implants  1997    There were no vitals filed for this visit.       Subjective Assessment - 07/01/16 1102    Subjective Pt is an 82 y/o male who presents to [facility] for L shoulder pain x 2-3 weeks ago mostly affecting sleep.  Pt also reports decreased motion and use of LUE.  Pt reports MD provided pain patches and arm/shoulder seems to feel better.  Pertinent History CAD, OA, MI, HTN, HLD, hx prostate and kidney cancer; R shoulder dislocation with very limited motion   Limitations House hold activities   Diagnostic tests none   Patient Stated Goals improve pain and motion   Currently in Pain? Yes   Pain Score 0-No pain  up to 7/10   Pain Location Shoulder   Pain Orientation Left   Pain Descriptors / Indicators Sharp   Pain Type --  sub acute   Pain Onset 1 to 4 weeks ago   Pain Frequency Intermittent   Aggravating Factors  reaching overhead   Pain Relieving Factors avoiding reaching            [facility] PT Assessment - 07/01/16 1105      Assessment   Medical Diagnosis L shoulder pain   Referring Provider Dr. Melinda Crutch   Onset Date/Surgical Date --  2-3 weeks ago   Hand Dominance Right   Next MD Visit Nov 2017   Prior Therapy PT for bil TKA      Precautions   Precautions None     Restrictions   Weight Bearing Restrictions No     Balance Screen   Has the patient fallen in the past 6 months No   Has the patient had a decrease in activity level because of a fear of falling?  No   Is the patient reluctant to leave their home because of a fear of falling?  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     Cognition   Overall Cognitive Status Within [place] for tasks assessed     Observation/Other Assessments   Focus on Therapeutic Outcomes (FOTO)  50 (50% limited; predicted 37% limited)     Posture/Postural Control   Posture/Postural Control Postural limitations   Postural Limitations Rounded Shoulders;Forward head;Increased thoracic kyphosis     AROM   Overall AROM Comments R shoulder no assessed due to limited motion from previous injury   Left Shoulder Flexion 88 Degrees   Left Shoulder ABduction 65 Degrees   Left Shoulder Internal Rotation 90 Degrees   Left Shoulder External Rotation 35 Degrees     PROM   PROM Assessment Site Shoulder   Left Shoulder Flexion 115 Degrees   Left Shoulder ABduction 93 Degrees   Left Shoulder Internal Rotation 90 Degrees   Left Shoulder External Rotation 40 Degrees     Strength   Strength Assessment Site Shoulder;Elbow   Left Shoulder Flexion 3-/5   Left Shoulder ABduction 3-/5   Left Shoulder Internal Rotation 4/5   Left Shoulder External Rotation 2/5   Right/Left Elbow Left   Left Elbow Flexion 4/5   Left Elbow Extension 3-/5     Palpation   Palpation comment tenderness along L proximal biceps tendon and supraspinatus insertion     Special Tests    Special Tests Rotator Cuff Impingement   Rotator Cuff Impingment tests  Michel Bickers test     Hawkins-Kennedy test   Findings Negative   Side Left                   [facility] Adult PT Treatment/Exercise - 07/01/16 1105      Exercises   Exercises Shoulder     Shoulder  Exercises: Standing   Flexion AAROM;Left;10 reps   Flexion Limitations on wall   ABduction AAROM;Left;10 reps   ABduction Limitations with cane   Retraction 10 reps;Both   Other Standing Exercises min cues for all exercises for technique                PT Education - 07/01/16 1137    Education provided Yes   Education Details HEP, clinical findings, POC   Person(s) Educated Patient   Methods Explanation;Demonstration;Handout   Comprehension Verbalized understanding;Returned demonstration;Need further instruction             PT Long Term Goals - 07/01/16 1141      PT LONG TERM GOAL #1   Title independent with HEP (08/12/16)   Time 6   Period Weeks   Status New     PT LONG TERM GOAL #2   Title improve AROM L shoulder to 100 degrees flexion, 90 degrees abduction and 50 degrees er for improved function (08/12/16)   Time 6   Period Weeks   Status New     PT LONG TERM GOAL #3   Title demonstrate ability to reach light objects (1-2#) overhead without increase in pain for improved function (08/12/16)   Time 6   Period Weeks   Status New     PT LONG TERM GOAL #4   Title report ability to sleep on side without pain for improved sleep (08/12/16)   Time 6   Period Weeks   Status New               Plan - 07/01/16 1138    Clinical Impression Statement Pt is an 82 y/o male who presents to [facility] for L shoulder pain and decreased motion and function.  Pt demonstrates postural abnormalities, decreased strength and decreased motion affecting function.  Pt using nitro patches for pain and therefore some of pain on evaluation may be masked by use.  Will continue to assess and tx as needed.   Rehab Potential Good   PT Frequency 2x / week   PT Duration 6 weeks    PT Treatment/Interventions ADLs/Self Care Home Management;Cryotherapy;Electrical Stimulation;Ultrasound;Moist Heat;Iontophoresis 23m/ml Dexamethasone;Therapeutic activities;Therapeutic exercise;Manual techniques;Passive range of motion;Taping;Vasopneumatic Device;Dry needling   PT Next Visit Plan ROM exercises, isometrics, posture strengthening   Consulted and Agree with Plan of Care Patient      Patient will benefit from skilled therapeutic intervention in order to improve the following deficits and impairments:  Postural dysfunction, Decreased strength, Decreased range of motion, Impaired UE functional use  Visit Diagnosis: Pain in left shoulder - Plan: PT plan of care cert/re-cert  Stiffness of left shoulder, not elsewhere classified - Plan: PT plan of care cert/re-cert  Abnormal posture - Plan: PT plan of care cert/re-cert      G-Codes - 033/35/451143    Functional Assessment Tool Used FOTO 50   Functional Limitation Carrying, moving and handling objects   Carrying, Moving and Handling Objects Current Status ((G2563 At least 40 percent but less than 60 percent impaired, limited or restricted   Carrying, Moving and Handling Objects Goal Status ((S9373 At least 20 percent but less than 40 percent impaired, limited or restricted       Problem List Patient Active Problem List   Diagnosis Date Noted  . History of nephrectomy- Lt 06/16/2014  . Chronic renal impairment, stage 3 (moderate) 06/16/2014  . CAD-RCA/RI PCI in '97 05/15/2013  . Essential hypertension 05/15/2013  . Hyperlipidemia 05/15/2013  Laureen Abrahams, PT, DPT 07/01/16 11:45 AM    [facility] [address]  [place], [place], [address] Phone: [phone]   Fax:  [phone]  Name: Donald Todd MRN: [id number] Date of Birth: 11/13/1929

## 2016-07-01 NOTE — Patient Instructions (Signed)
ROM: Flexion (Alternate)    Slide leftt arm up wall, with palm out, by leaning toward wall. Hold __2-3__ seconds. Repeat _10-15___ times per set. Do __1__ sets per session. Do __2-3__ sessions per day.  http://orth.exer.us/758   Copyright  VHI. All rights reserved.    Cane Exercise: Abduction    Hold cane with left hand over end, palm-up, with other hand palm-down. Move arm out from side and up by pushing with other arm. Hold __2-3__ seconds. Repeat __10-15__ times. Do __2-3__ sessions per day.  http://gt2.exer.us/81   Copyright  VHI. All rights reserved.    Scapular Retraction (Standing)    With arms at sides, pinch shoulder blades together. Repeat __10__ times per set. Do __1__ sets per session. Do __2-3__ sessions per day.  http://orth.exer.us/944   Copyright  VHI. All rights reserved.

## 2016-07-06 ENCOUNTER — Ambulatory Visit: Payer: Medicare Other | Attending: Family Medicine | Admitting: Physical Therapy

## 2016-07-06 DIAGNOSIS — M25512 Pain in left shoulder: Secondary | ICD-10-CM | POA: Diagnosis not present

## 2016-07-06 DIAGNOSIS — R293 Abnormal posture: Secondary | ICD-10-CM

## 2016-07-06 DIAGNOSIS — M25612 Stiffness of left shoulder, not elsewhere classified: Secondary | ICD-10-CM | POA: Diagnosis not present

## 2016-07-06 NOTE — Patient Instructions (Signed)
Strengthening: Isometric Flexion  Using wall for resistance, press left fist into ball using light pressure. Hold __5__ seconds. Repeat _15___ times per set. Do _1___ sets per session. Do __2-3__ sessions per day.    Extension (Isometric)  Place left bent elbow and back of arm against wall. Press elbow against wall. Hold __5__ seconds. Repeat __15__ times. Do __2-3__ sessions per day.  Internal Rotation (Isometric)  Place palm of left fist against door frame, with elbow bent. Press fist against door frame. Hold __5__ seconds. Repeat _15___ times. Do _2-3___ sessions per day.  External Rotation (Isometric)  Place back of left fist against door frame, with elbow bent. Press fist against door frame. Hold __15__ seconds. Repeat __15__ times. Do __2-3__ sessions per day.  Copyright  VHI. All rights reserved.

## 2016-07-06 NOTE — Therapy (Signed)
[facility] [address]  [place], [place], [address] Phone: [phone]   Fax:  [phone]  Physical Therapy Treatment  Patient Details  Name: Donald Todd MRN: [id number] Date of Birth: Jul 23, 1929 Referring Provider: Dr. Melinda Crutch  Encounter Date: 07/06/2016      PT End of Session - 07/06/16 1426    Visit Number 2   Number of Visits 12   Date for PT Re-Evaluation 08/12/16   Authorization Type [employer] - no PTA   PT Start Time 1347   PT Stop Time 1438   PT Time Calculation (min) 51 min   Activity Tolerance Patient tolerated treatment well   Behavior During Therapy [facility] for tasks assessed/performed      Past Medical History:  Diagnosis Date  . Arthritis   . CAD (coronary artery disease)    last cath. 07-09-2010  . Cancer [facility])    kidney cancer , prostate cancer   . Chronic kidney disease   . H/O cardiovascular stress test 08-04-2010   ef 55% NO ISCHEMIA  . H/O unilateral nephrectomy    lt. nephrectomy  . Heart attack ([place]) 1993  . Hyperlipemia   . Hypertension    renal dopplers 218.13-normal patency  . Inguinal hernia 08/01/11   right  . Prostate disease    Cancer S/P seed implant    Past Surgical History:  Procedure Laterality Date  . ANGIOPLASTY  1993  . athroscopic knee surgery  2002   right  . bone scan  2005  . CARDIAC CATHETERIZATION  07-09-2010   normal left ventricular function, coronary obsstructive disease with 20% proximal an 30-40% mid lt. anterior descending narrowing ; widely patent stent in the proximal ramus intermediate vessel; 50-70-% narrowing in the mid circumflex,and widely patent stent  in the rt. coronary   . CARDIAC CATHETERIZATION  12-22-2003   widely patent stents with noncritical coronary artery disease and normalLV function  . CHOLECYSTECTOMY  05/05/2009   Laparoscopic  . colonscopy  2004   with biopsy  . CORONARY ANGIOPLASTY WITH STENT PLACEMENT  03-15-1996   pci to  rci and ramus branch using j&j [id number] stent,post dialated  at  20atm (rca)  and 14 atm (ramus branch. ef 55%            . HERNIA REPAIR  08/01/11   RIH  . INGUINAL HERNIA REPAIR Left 03/29/2013   Procedure: HERNIA REPAIR INGUINAL ADULT;  Surgeon: Edward Jolly, MD;  Location: [facility];  Service: General;  Laterality: Left;  . INSERTION OF MESH Left 03/29/2013   Procedure: INSERTION OF MESH;  Surgeon: Edward Jolly, MD;  Location: [facility];  Service: General;  Laterality: Left;  . JOINT REPLACEMENT  2006 and 2010   left 2006, right 2010  . KIDNEY SURGERY  2002 and 2005   2002 - partial removal of left. 2005 complete removal  . PROSTATE BIOPSY    . prostate seed implant    . radiation treatment  2002   prostate  . stent implants  1997    There were no vitals filed for this visit.      Subjective Assessment - 07/06/16 1350    Subjective feels like arm is moving better; no pain today   Currently in Pain? No/denies                         [facility] Adult PT Treatment/Exercise - 07/06/16 1350  Shoulder Exercises: Supine   Protraction Left;10 reps;Weights   Protraction Weight (lbs) 2   Horizontal ABduction Left;10 reps;Theraband   Theraband Level (Shoulder Horizontal ABduction) Level 1 (Yellow)   External Rotation Both;10 reps;Theraband   Flexion Left;10 reps;Weights   Shoulder Flexion Weight (lbs) 1   Other Supine Exercises resisted extension with yellow tband x 10 on L     Shoulder Exercises: Standing   Flexion AAROM;Left;10 reps   Flexion Limitations wall ladder   ABduction AAROM;Left;10 reps   ABduction Limitations wall ladder   Other Standing Exercises PT AA standing flexion and abdct x 10     Shoulder Exercises: Isometric Strengthening   Flexion Limitations 5"x15   Extension Limitations 5"x15   External Rotation Limitations 5"x15   Internal Rotation Limitations 5"x15     Modalities   Modalities Electrical Stimulation;Moist Heat     Moist  Heat Therapy   Number Minutes Moist Heat 15 Minutes   Moist Heat Location Shoulder     Electrical Stimulation   Electrical Stimulation Location L shoulder   Electrical Stimulation Action IFC   Electrical Stimulation Parameters to tolerance   Electrical Stimulation Goals Pain                PT Education - 07/06/16 1426    Education provided Yes   Education Details isometrics HEP   Person(s) Educated Patient   Methods Explanation;Demonstration;Handout   Comprehension Verbalized understanding;Returned demonstration;Need further instruction             PT Long Term Goals - 07/01/16 1141      PT LONG TERM GOAL #1   Title independent with HEP (08/12/16)   Time 6   Period Weeks   Status New     PT LONG TERM GOAL #2   Title improve AROM L shoulder to 100 degrees flexion, 90 degrees abduction and 50 degrees er for improved function (08/12/16)   Time 6   Period Weeks   Status New     PT LONG TERM GOAL #3   Title demonstrate ability to reach light objects (1-2#) overhead without increase in pain for improved function (08/12/16)   Time 6   Period Weeks   Status New     PT LONG TERM GOAL #4   Title report ability to sleep on side without pain for improved sleep (08/12/16)   Time 6   Period Weeks   Status New               Plan - 07/06/16 1426    Clinical Impression Statement Pt with improved AAROM today but continues to demonstrate significant weakness limiting ability to move shoulder through full range.  Issued isometric HEP to begin strengthening.  Will continue to benefit from PT to maximize function.   PT Treatment/Interventions ADLs/Self Care Home Management;Cryotherapy;Electrical Stimulation;Ultrasound;Moist Heat;Iontophoresis 58m/ml Dexamethasone;Therapeutic activities;Therapeutic exercise;Manual techniques;Passive range of motion;Taping;Vasopneumatic Device;Dry needling   PT Next Visit Plan ROM exercises, isometrics, posture strengthening   Consulted and  Agree with Plan of Care Patient      Patient will benefit from skilled therapeutic intervention in order to improve the following deficits and impairments:  Postural dysfunction, Decreased strength, Decreased range of motion, Impaired UE functional use  Visit Diagnosis: Pain in left shoulder  Stiffness of left shoulder, not elsewhere classified  Abnormal posture     Problem List Patient Active Problem List   Diagnosis Date Noted  . History of nephrectomy- Lt 06/16/2014  . Chronic renal impairment, stage 3 (moderate) 06/16/2014  .  CAD-RCA/RI PCI in '97 05/15/2013  . Essential hypertension 05/15/2013  . Hyperlipidemia 05/15/2013      Laureen Abrahams, PT, DPT 07/06/16 3:21 PM    [facility] [address]  [place], [place], [address] Phone: [phone]   Fax:  [phone]  Name: Donald Todd MRN: [id number] Date of Birth: Nov 26, 1929

## 2016-07-08 ENCOUNTER — Ambulatory Visit: Payer: Medicare Other | Admitting: Physical Therapy

## 2016-07-08 DIAGNOSIS — M25512 Pain in left shoulder: Secondary | ICD-10-CM

## 2016-07-08 DIAGNOSIS — M25612 Stiffness of left shoulder, not elsewhere classified: Secondary | ICD-10-CM | POA: Diagnosis not present

## 2016-07-08 DIAGNOSIS — R293 Abnormal posture: Secondary | ICD-10-CM

## 2016-07-08 NOTE — Therapy (Signed)
[facility] [address]  [place], [place], [address] Phone: [phone]   Fax:  [phone]  Physical Therapy Treatment  Patient Details  Name: Donald Todd MRN: [id number] Date of Birth: 25-Nov-1929 Referring Provider: Dr. Melinda Crutch  Encounter Date: 07/08/2016      PT End of Session - 07/08/16 1028    Visit Number 3   Number of Visits 12   Date for PT Re-Evaluation 08/12/16   Authorization Type [employer] - no PTA   PT Start Time 0945   PT Stop Time 1025   PT Time Calculation (min) 40 min   Activity Tolerance Patient tolerated treatment well   Behavior During Therapy [facility] for tasks assessed/performed      Past Medical History:  Diagnosis Date  . Arthritis   . CAD (coronary artery disease)    last cath. 07-09-2010  . Cancer [facility])    kidney cancer , prostate cancer   . Chronic kidney disease   . H/O cardiovascular stress test 08-04-2010   ef 55% NO ISCHEMIA  . H/O unilateral nephrectomy    lt. nephrectomy  . Heart attack ([place]) 1993  . Hyperlipemia   . Hypertension    renal dopplers 218.13-normal patency  . Inguinal hernia 08/01/11   right  . Prostate disease    Cancer S/P seed implant    Past Surgical History:  Procedure Laterality Date  . ANGIOPLASTY  1993  . athroscopic knee surgery  2002   right  . bone scan  2005  . CARDIAC CATHETERIZATION  07-09-2010   normal left ventricular function, coronary obsstructive disease with 20% proximal an 30-40% mid lt. anterior descending narrowing ; widely patent stent in the proximal ramus intermediate vessel; 50-70-% narrowing in the mid circumflex,and widely patent stent  in the rt. coronary   . CARDIAC CATHETERIZATION  12-22-2003   widely patent stents with noncritical coronary artery disease and normalLV function  . CHOLECYSTECTOMY  05/05/2009   Laparoscopic  . colonscopy  2004   with biopsy  . CORONARY ANGIOPLASTY WITH STENT PLACEMENT  03-15-1996   pci to  rci and ramus branch using j&j [id number] stent,post dialated  at  20atm (rca)  and 14 atm (ramus branch. ef 55%            . HERNIA REPAIR  08/01/11   RIH  . INGUINAL HERNIA REPAIR Left 03/29/2013   Procedure: HERNIA REPAIR INGUINAL ADULT;  Surgeon: Edward Jolly, MD;  [facility];  Service: General;  Laterality: Left;  . INSERTION OF MESH Left 03/29/2013   Procedure: INSERTION OF MESH;  Surgeon: Edward Jolly, MD;  [facility]: [facility];  Service: General;  Laterality: Left;  . JOINT REPLACEMENT  2006 and 2010   left 2006, right 2010  . KIDNEY SURGERY  2002 and 2005   2002 - partial removal of left. 2005 complete removal  . PROSTATE BIOPSY    . prostate seed implant    . radiation treatment  2002   prostate  . stent implants  1997    There were no vitals filed for this visit.      Subjective Assessment - 07/08/16 0945    Subjective was a little sore after last session; feels fine today.   Pertinent History CAD, OA, MI, HTN, HLD, hx prostate and kidney cancer; R shoulder dislocation with very limited motion   Limitations House hold activities   Patient Stated Goals improve pain and motion  Currently in Pain? No/denies                         [facility] Adult PT Treatment/Exercise - 07/08/16 0946      Shoulder Exercises: Supine   Protraction Left;10 reps;Weights   Protraction Weight (lbs) 2   Horizontal ABduction Left;10 reps;Theraband   Theraband Level (Shoulder Horizontal ABduction) Level 1 (Yellow)   Flexion Left;10 reps   Other Supine Exercises isometric extension, ir/er, flexion and abdct 10x5"     Shoulder Exercises: Prone   Retraction Left;10 reps;Weights   Retraction Weight (lbs) 2   Flexion Left;10 reps   Extension Left;10 reps;Weights   Extension Weight (lbs) 2   Horizontal ABduction 1 Left;10 reps;Weights   Horizontal ABduction 1 Weight (lbs) 1     Shoulder Exercises: Sidelying   External Rotation Left;10 reps;Weights   External Rotation  Weight (lbs) 1   ABduction Left;10 reps   ABduction Limitations to 90     Shoulder Exercises: Standing   Flexion AAROM;Left;10 reps   Flexion Limitations wall ladder   ABduction AAROM;Left;10 reps   ABduction Limitations wall ladder   Retraction Both;20 reps   Theraband Level (Shoulder Retraction) Level 2 (Red)     Shoulder Exercises: Pulleys   Flexion 3 minutes   ABduction 3 minutes   ABduction Limitations scaption     Shoulder Exercises: ROM/Strengthening   Wall [place] 1# cuff weight flexion; horizontal abdct/addct/circles x 10 each direction                PT Education - 07/08/16 1027    Education provided Yes   Education Details provided red theraband for scap retraction   Person(s) Educated Patient   Methods Other (comment)   Comprehension Verbalized understanding;Returned demonstration             PT Long Term Goals - 07/08/16 1028      PT LONG TERM GOAL #1   Title independent with HEP (08/12/16)   Status On-going     PT LONG TERM GOAL #2   Title improve AROM L shoulder to 100 degrees flexion, 90 degrees abduction and 50 degrees er for improved function (08/12/16)   Status On-going     PT LONG TERM GOAL #3   Title demonstrate ability to reach light objects (1-2#) overhead without increase in pain for improved function (08/12/16)   Status On-going     PT LONG TERM GOAL #4   Title report ability to sleep on side without pain for improved sleep (08/12/16)   Status On-going               Plan - 07/08/16 1028    Clinical Impression Statement Pt tolerated all low weight or no weight active exercises today; c/o soreness with flexion exercises only and still tender at proximal biceps tendon.  Will continue to benefit from PT to maximize function.   PT Treatment/Interventions ADLs/Self Care Home Management;Cryotherapy;Electrical Stimulation;Ultrasound;Moist Heat;Iontophoresis 44m/ml Dexamethasone;Therapeutic activities;Therapeutic exercise;Manual  techniques;Passive range of motion;Taping;Vasopneumatic Device;Dry needling   PT Next Visit Plan ROM exercises, isometrics, posture strengthening   Consulted and Agree with Plan of Care Patient      Patient will benefit from skilled therapeutic intervention in order to improve the following deficits and impairments:  Postural dysfunction, Decreased strength, Decreased range of motion, Impaired UE functional use  Visit Diagnosis: Pain in left shoulder  Stiffness of left shoulder, not elsewhere classified  Abnormal posture     Problem List Patient  Active Problem List   Diagnosis Date Noted  . History of nephrectomy- Lt 06/16/2014  . Chronic renal impairment, stage 3 (moderate) 06/16/2014  . CAD-RCA/RI PCI in '97 05/15/2013  . Essential hypertension 05/15/2013  . Hyperlipidemia 05/15/2013       Laureen Abrahams, PT, DPT 07/08/16 10:31 AM    [facility] [address]  [place], [place], [address] Phone: [phone]   Fax:  [phone]  Name: Donald Todd MRN: [id number] Date of Birth: [birth year]

## 2016-07-11 ENCOUNTER — Ambulatory Visit: Payer: Medicare Other | Admitting: Physical Therapy

## 2016-07-11 DIAGNOSIS — R293 Abnormal posture: Secondary | ICD-10-CM | POA: Diagnosis not present

## 2016-07-11 DIAGNOSIS — M25512 Pain in left shoulder: Secondary | ICD-10-CM

## 2016-07-11 DIAGNOSIS — M25612 Stiffness of left shoulder, not elsewhere classified: Secondary | ICD-10-CM | POA: Diagnosis not present

## 2016-07-11 NOTE — Patient Instructions (Signed)
Weight Exercise: Flexion    Lie on back, __1__ lb weight in left hand down at side. Raise arm up and overhead keeping elbow straight. Repeat __10__ times. Do _1-2___ sessions per day.   http://gt2.exer.us/79   Copyright  VHI. All rights reserved.   FLEXION: Supine (Active)    Lie on back, right arm at side. Bend elbow, bringing hand toward shoulder. Use _2__ lbs. Complete _1__ sets of _10__ repetitions. Perform _1-2__ sessions per day.  Copyright  VHI. All rights reserved.   Abduction - Side-Lying (Dumbbell)    Lie with neck supported, left arm on hip. Lift straight arm toward ceiling. Repeat __10__ times per set. Do _1___ sets per session. Do __1-2__ sessions per day. Use __1__ lb weight.   Copyright  VHI. All rights reserved.     External Rotation: Side-Lying (Dumbbell)    Lie with neck supported, left elbow bent to 90, forearm across stomach. Raise forearm, keeping elbow at side. Repeat __10__ times per set. Do ___1_ sets per session. Do __1-2__ sessions per day. Use _1___ lb weight.   Copyright  VHI. All rights reserved.
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
## 2016-07-15 ENCOUNTER — Ambulatory Visit: Payer: Medicare Other | Admitting: Physical Therapy

## 2016-07-15 DIAGNOSIS — M25512 Pain in left shoulder: Secondary | ICD-10-CM | POA: Diagnosis not present

## 2016-07-15 DIAGNOSIS — R293 Abnormal posture: Secondary | ICD-10-CM

## 2016-07-15 DIAGNOSIS — M25612 Stiffness of left shoulder, not elsewhere classified: Secondary | ICD-10-CM | POA: Diagnosis not present

## 2016-07-15 NOTE — Therapy (Signed)
[facility] [address]  [place], [place], [address] Phone: [phone]   Fax:  [phone]  Physical Therapy Treatment  Patient Details  Name: MERICK KELLEHER MRN: [id number] Date of Birth: 1929-04-17 Referring Provider: Dr. Melinda Crutch  Encounter Date: 07/15/2016      PT End of Session - 07/15/16 1059    Visit Number 5   Number of Visits 12   Date for PT Re-Evaluation 08/12/16   Authorization Type [employer] - no PTA   PT Start Time 1016   PT Stop Time 1113   PT Time Calculation (min) 57 min   Activity Tolerance Patient tolerated treatment well   Behavior During Therapy [facility] for tasks assessed/performed      Past Medical History:  Diagnosis Date  . Arthritis   . CAD (coronary artery disease)    last cath. 07-09-2010  . Cancer [facility])    kidney cancer , prostate cancer   . Chronic kidney disease   . H/O cardiovascular stress test 08-04-2010   ef 55% NO ISCHEMIA  . H/O unilateral nephrectomy    lt. nephrectomy  . Heart attack ([place]) 1993  . Hyperlipemia   . Hypertension    renal dopplers 218.13-normal patency  . Inguinal hernia 08/01/11   right  . Prostate disease    Cancer S/P seed implant    Past Surgical History:  Procedure Laterality Date  . ANGIOPLASTY  1993  . athroscopic knee surgery  2002   right  . bone scan  2005  . CARDIAC CATHETERIZATION  07-09-2010   normal left ventricular function, coronary obsstructive disease with 20% proximal an 30-40% mid lt. anterior descending narrowing ; widely patent stent in the proximal ramus intermediate vessel; 50-70-% narrowing in the mid circumflex,and widely patent stent  in the rt. coronary   . CARDIAC CATHETERIZATION  12-22-2003   widely patent stents with noncritical coronary artery disease and normalLV function  . CHOLECYSTECTOMY  05/05/2009   Laparoscopic  . colonscopy  2004   with biopsy  . CORONARY ANGIOPLASTY WITH STENT PLACEMENT  03-15-1996   pci to  rci and ramus branch using j&j [id number] stent,post dialated  at  20atm (rca)  and 14 atm (ramus branch. ef 55%            . HERNIA REPAIR  08/01/11   RIH  . INGUINAL HERNIA REPAIR Left 03/29/2013   Procedure: HERNIA REPAIR INGUINAL ADULT;  Surgeon: Edward Jolly, MD;  Location: [facility];  Service: General;  Laterality: Left;  . INSERTION OF MESH Left 03/29/2013   Procedure: INSERTION OF MESH;  Surgeon: Edward Jolly, MD;  Location: [facility];  Service: General;  Laterality: Left;  . JOINT REPLACEMENT  2006 and 2010   left 2006, right 2010  . KIDNEY SURGERY  2002 and 2005   2002 - partial removal of left. 2005 complete removal  . PROSTATE BIOPSY    . prostate seed implant    . radiation treatment  2002   prostate  . stent implants  1997    There were no vitals filed for this visit.      Subjective Assessment - 07/15/16 1017    Subjective shoulder was a little sore wednesday; but good today.   Pertinent History CAD, OA, MI, HTN, HLD, hx prostate and kidney cancer; R shoulder dislocation with very limited motion   Limitations House hold activities   Patient Stated Goals improve pain and motion  Currently in Pain? No/denies                         [facility] Adult PT Treatment/Exercise - 07/15/16 1018      Shoulder Exercises: Supine   Horizontal ABduction Left;15 reps;Theraband   Theraband Level (Shoulder Horizontal ABduction) Level 1 (Yellow)   External Rotation Both;Theraband;15 reps   Theraband Level (Shoulder External Rotation) Level 1 (Yellow)   External Rotation Limitations mod cues for technique   Flexion Left;15 reps   Shoulder Flexion Weight (lbs) 1   Other Supine Exercises resisted extension with yellow tband x 15 on L   Other Supine Exercises isometric er, abdct and flexion 10x5 sec     Shoulder Exercises: Sidelying   External Rotation Left;10 reps;Weights   External Rotation Weight (lbs) 2   External Rotation Limitations passive concentric; active  eccentric     Shoulder Exercises: Standing   Other Standing Exercises with body blade eccentric holds in flexion and abduction x 10 each; difficulty holding against gravity     Shoulder Exercises: Pulleys   Flexion 2 minutes   ABduction 2 minutes   ABduction Limitations scaption     Shoulder Exercises: ROM/Strengthening   Wall Wash 2# cuff weight flexion x 10 with AA   Rhythmic Stabilization, Supine 2# 3x10 sec ant/post and laterally    Other ROM/Strengthening Exercises wall ladder flexion/scaption x 5 each way     Modalities   Modalities Electrical Stimulation;Moist Heat     Moist Heat Therapy   Number Minutes Moist Heat 15 Minutes   Moist Heat Location Shoulder     Electrical Stimulation   Electrical Stimulation Location L shoulder   Electrical Stimulation Action IFC   Electrical Stimulation Parameters to tolerance x 15 min   Electrical Stimulation Goals Pain                     PT Long Term Goals - 07/11/16 1348      PT LONG TERM GOAL #1   Title independent with HEP (08/12/16)   Status On-going     PT LONG TERM GOAL #2   Title improve AROM L shoulder to 100 degrees flexion, 90 degrees abduction and 50 degrees er for improved function (08/12/16)   Baseline flexion and abduction met   Status On-going     PT LONG TERM GOAL #3   Title demonstrate ability to reach light objects (1-2#) overhead without increase in pain for improved function (08/12/16)   Status On-going     PT LONG TERM GOAL #4   Title report ability to sleep on side without pain for improved sleep (08/12/16)   Status On-going               Plan - 07/15/16 1100    Clinical Impression Statement Pt with increase in pain/discomfort (reports pain is mild) with forward flexion.  Difficulty with eccentric holds in standing with flexion and abduction.  There is concern for tendon or muscle damage given inability to maintain against gravity but pt able to perform AROM in supine.  Will continue to  montor and address as needed.       PT Treatment/Interventions ADLs/Self Care Home Management;Cryotherapy;Electrical Stimulation;Ultrasound;Moist Heat;Iontophoresis 45m/ml Dexamethasone;Therapeutic activities;Therapeutic exercise;Manual techniques;Passive range of motion;Taping;Vasopneumatic Device;Dry needling   PT Next Visit Plan ROM exercises, isometrics, posture strengthening, eccentric control exercises   Consulted and Agree with Plan of Care Patient      Patient will benefit from skilled  therapeutic intervention in order to improve the following deficits and impairments:  Postural dysfunction, Decreased strength, Decreased range of motion, Impaired UE functional use  Visit Diagnosis: Pain in left shoulder  Stiffness of left shoulder, not elsewhere classified  Abnormal posture     Problem List Patient Active Problem List   Diagnosis Date Noted  . History of nephrectomy- Lt 06/16/2014  . Chronic renal impairment, stage 3 (moderate) 06/16/2014  . CAD-RCA/RI PCI in '97 05/15/2013  . Essential hypertension 05/15/2013  . Hyperlipidemia 05/15/2013      Laureen Abrahams, PT, DPT 07/15/16 11:13 AM    [facility] [address]  [place], [place], [address] Phone: [phone]   Fax:  [phone]  Name: ZACHARIE PORTNER MRN: [id number] Date of Birth: [birth year]

## 2016-07-19 ENCOUNTER — Ambulatory Visit: Payer: Medicare Other | Admitting: Physical Therapy

## 2016-07-19 DIAGNOSIS — R293 Abnormal posture: Secondary | ICD-10-CM

## 2016-07-19 DIAGNOSIS — M25612 Stiffness of left shoulder, not elsewhere classified: Secondary | ICD-10-CM

## 2016-07-19 DIAGNOSIS — M25512 Pain in left shoulder: Secondary | ICD-10-CM | POA: Diagnosis not present

## 2016-07-19 NOTE — Patient Instructions (Addendum)
Flexion - Supine (Dumbbell)    Lie with arms along body. Lift left arm and shoulders straight up, palms forward. Repeat __15__ times per set. Do __1__ sets per session. Do __5-7__ sessions per week. Use _2___ lb weights.   Copyright  VHI. All rights reserved.    Abduction - Side-Lying (Dumbbell)    Lie with neck supported, left arm on hip. Lift straight arm toward ceiling. Repeat _15___ times per set. Do __1__ sets per session. Do __5-7__ sessions per week. Use __1__ lb weight.   Copyright  VHI. All rights reserved.     IONTOPHORESIS PATIENT PRECAUTIONS & CONTRAINDICATIONS:  . Redness under one or both electrodes can occur.  This characterized by a uniform redness that usually disappears within 12 hours of treatment. . Small pinhead size blisters may result in response to the drug.  Contact your physician if the problem persists more than 24 hours. . On rare occasions, iontophoresis therapy can result in temporary skin reactions such as rash, inflammation, irritation or burns.  The skin reactions may be the result of individual sensitivity to the ionic solution used, the condition of the skin at the start of treatment, reaction to the materials in the electrodes, allergies or sensitivity to dexamethasone, or a poor connection between the patch and your skin.  Discontinue using iontophoresis if you have any of these reactions and report to your therapist. . Remove the Patch or electrodes if you have any undue sensation of pain or burning during the treatment and report discomfort to your therapist. . Tell your Therapist if you have had known adverse reactions to the application of electrical current. . If using the Patch, the LED light will turn off when treatment is complete and the patch can be removed.  Approximate treatment time is 1-3 hours.  Remove the patch when light goes off or after 6 hours. . The Patch can be worn during normal activity, however excessive motion where  the electrodes have been placed can cause poor contact between the skin and the electrode or uneven electrical current resulting in greater risk of skin irritation. [name] Keep out of the reach of children.   . DO NOT use if you have a cardiac pacemaker or any other electrically sensitive implanted device. . DO NOT use if you have a known sensitivity to dexamethasone. . DO NOT use during Magnetic Resonance Imaging (MRI). . DO NOT use over broken or compromised skin (e.g. sunburn, cuts, or acne) due to the increased risk of skin reaction. . DO NOT SHAVE over the area to be treated:  To establish good contact between the Patch and the skin, excessive hair may be clipped. . DO NOT place the Patch or electrodes on or over your eyes, directly over your heart, or brain. . DO NOT reuse the Patch or electrodes as this may cause burns to occur.

## 2016-07-19 NOTE — Therapy (Signed)
[facility] [address]  [place], [place], [address] Phone: [phone]   Fax:  [phone]  Physical Therapy Treatment  Patient Details  Name: Donald Todd MRN: [id number] Date of Birth: [birth year] Referring Provider: Dr. Melinda Crutch  Encounter Date: 07/19/2016      PT End of Session - 07/19/16 1403    Visit Number 6   Number of Visits 12   Date for PT Re-Evaluation 08/12/16   Authorization Type [employer] - no PTA   PT Start Time 1313   PT Stop Time 1359   PT Time Calculation (min) 46 min   Activity Tolerance Patient tolerated treatment well   Behavior During Therapy [facility] for tasks assessed/performed      Past Medical History:  Diagnosis Date  . Arthritis   . CAD (coronary artery disease)    last cath. 07-09-2010  . Cancer [facility])    kidney cancer , prostate cancer   . Chronic kidney disease   . H/O cardiovascular stress test 08-04-2010   ef 55% NO ISCHEMIA  . H/O unilateral nephrectomy    lt. nephrectomy  . Heart attack ([place]) 1993  . Hyperlipemia   . Hypertension    renal dopplers 218.13-normal patency  . Inguinal hernia 08/01/11   right  . Prostate disease    Cancer S/P seed implant    Past Surgical History:  Procedure Laterality Date  . ANGIOPLASTY  1993  . athroscopic knee surgery  2002   right  . bone scan  2005  . CARDIAC CATHETERIZATION  07-09-2010   normal left ventricular function, coronary obsstructive disease with 20% proximal an 30-40% mid lt. anterior descending narrowing ; widely patent stent in the proximal ramus intermediate vessel; 50-70-% narrowing in the mid circumflex,and widely patent stent  in the rt. coronary   . CARDIAC CATHETERIZATION  12-22-2003   widely patent stents with noncritical coronary artery disease and normalLV function  . CHOLECYSTECTOMY  05/05/2009   Laparoscopic  . colonscopy  2004   with biopsy  . CORONARY ANGIOPLASTY WITH STENT PLACEMENT  03-15-1996   pci to  rci and ramus branch using j&j [id number] stent,post dialated  at  20atm (rca)  and 14 atm (ramus branch. ef 55%            . HERNIA REPAIR  08/01/11   RIH  . INGUINAL HERNIA REPAIR Left 03/29/2013   Procedure: HERNIA REPAIR INGUINAL ADULT;  Surgeon: Edward Jolly, MD;  [facility];  Service: General;  Laterality: Left;  . INSERTION OF MESH Left 03/29/2013   Procedure: INSERTION OF MESH;  Surgeon: Edward Jolly, MD;  [facility];  Service: General;  Laterality: Left;  . JOINT REPLACEMENT  2006 and 2010   left 2006, right 2010  . KIDNEY SURGERY  2002 and 2005   2002 - partial removal of left. 2005 complete removal  . PROSTATE BIOPSY    . prostate seed implant    . radiation treatment  2002   prostate  . stent implants  1997    There were no vitals filed for this visit.      Subjective Assessment - 07/19/16 1315    Subjective shoulder is a little sore - had to drive to [facility] yesterday   Pertinent History CAD, OA, MI, HTN, HLD, hx prostate and kidney cancer; R shoulder dislocation with very limited motion   Limitations House hold activities   Patient Stated Goals improve pain and  motion   Currently in Pain? Yes   Pain Score 5    Pain Location Shoulder   Pain Orientation Left   Pain Onset 1 to 4 weeks ago   Pain Frequency Intermittent   Aggravating Factors  reaching overhead   Pain Relieving Factors avoiding reaching                         [facility] Adult PT Treatment/Exercise - 07/19/16 1316      Shoulder Exercises: Supine   Protraction Left;Weights;20 reps   Protraction Weight (lbs) 2     Shoulder Exercises: Seated   Flexion Left;10 reps   Flexion Limitations eccentric holds at 90 degrees   Abduction Left;10 reps   ABduction Limitations eccentric holds at 90 degrees     Shoulder Exercises: Sidelying   ABduction Left;20 reps   ABduction Weight (lbs) 1   ABduction Limitations to 90     Shoulder Exercises: Standing   External Rotation  Strengthening;Left;15 reps;Theraband   Theraband Level (Shoulder External Rotation) Level 1 (Yellow)   Internal Rotation Left;15 reps;Theraband   Theraband Level (Shoulder Internal Rotation) Level 1 (Yellow)   Flexion Left;AAROM;10 reps   Flexion Limitations LUE up wall with lift off at end range   Extension Both;10 reps;Theraband   Theraband Level (Shoulder Extension) Level 3 (Green)   Retraction Both;20 reps   Theraband Level (Shoulder Retraction) Level 3 (Green)  5 sec hold     Shoulder Exercises: Pulleys   Flexion 2 minutes   ABduction 2 minutes   ABduction Limitations scaption     Shoulder Exercises: ROM/Strengthening   Cybex Row Limitations 15# x 15 both grips     Shoulder Exercises: Isometric Strengthening   Flexion Supine   Flexion Limitations 5" x 10   Extension Supine   Extension Limitations 5" x 10   External Rotation Supine   External Rotation Limitations 5" x 10   Internal Rotation Supine   Internal Rotation Limitations 5" x 10   ABduction Supine   ABduction Limitations 5" x 10   ADduction Supine   ADduction Limitations 5" x 10     Modalities   Modalities Iontophoresis     Iontophoresis   Type of Iontophoresis Dexamethasone   Location L shoulder/proximal biceps tendon   Dose 1.0 mL   Time 8 hour patch                PT Education - 07/19/16 1403    Education provided Yes   Education Details added to HEP flexion and abduction; ionto   Person(s) Educated Patient   Methods Explanation;Demonstration;Handout   Comprehension Verbalized understanding;Returned demonstration             PT Long Term Goals - 07/11/16 1348      PT LONG TERM GOAL #1   Title independent with HEP (08/12/16)   Status On-going     PT LONG TERM GOAL #2   Title improve AROM L shoulder to 100 degrees flexion, 90 degrees abduction and 50 degrees er for improved function (08/12/16)   Baseline flexion and abduction met   Status On-going     PT LONG TERM GOAL #3   Title  demonstrate ability to reach light objects (1-2#) overhead without increase in pain for improved function (08/12/16)   Status On-going     PT LONG TERM GOAL #4   Title report ability to sleep on side without pain for improved sleep (08/12/16)   Status On-going  Plan - 07/19/16 1404    Clinical Impression Statement Pt reports increased soreness today but likely from driving long periods yesterday.  Continues to demonstrate significant strength deficits.  Will continue to benefit from PT to maximize function.   PT Treatment/Interventions ADLs/Self Care Home Management;Cryotherapy;Electrical Stimulation;Ultrasound;Moist Heat;Iontophoresis 86m/ml Dexamethasone;Therapeutic activities;Therapeutic exercise;Manual techniques;Passive range of motion;Taping;Vasopneumatic Device;Dry needling   PT Next Visit Plan ROM exercises, isometrics, posture strengthening, eccentric control exercises   Consulted and Agree with Plan of Care Patient      Patient will benefit from skilled therapeutic intervention in order to improve the following deficits and impairments:  Postural dysfunction, Decreased strength, Decreased range of motion, Impaired UE functional use  Visit Diagnosis: Pain in left shoulder  Stiffness of left shoulder, not elsewhere classified  Abnormal posture     Problem List Patient Active Problem List   Diagnosis Date Noted  . History of nephrectomy- Lt 06/16/2014  . Chronic renal impairment, stage 3 (moderate) 06/16/2014  . CAD-RCA/RI PCI in '97 05/15/2013  . Essential hypertension 05/15/2013  . Hyperlipidemia 05/15/2013       SLaureen Abrahams PT, DPT 07/19/16 2:05 PM    [facility] [place] [address]Phone: [phone]  Fax:  [phone] Name: WRISHIK TUBBYMRN: [id number] of Birth: [birth year]

## 2016-07-22 ENCOUNTER — Ambulatory Visit: Payer: Medicare Other | Admitting: Physical Therapy

## 2016-07-22 DIAGNOSIS — R293 Abnormal posture: Secondary | ICD-10-CM

## 2016-07-22 DIAGNOSIS — M25512 Pain in left shoulder: Secondary | ICD-10-CM

## 2016-07-22 DIAGNOSIS — M25612 Stiffness of left shoulder, not elsewhere classified: Secondary | ICD-10-CM | POA: Diagnosis not present

## 2016-07-22 NOTE — Therapy (Signed)
[facility] [address]  [place], [place], [address] Phone: [phone]   Fax:  [phone]  Physical Therapy Treatment  Patient Details  Name: Donald Todd MRN: [id number] Date of Birth: 12-14-1928 Referring Provider: Dr. Melinda Crutch  Encounter Date: 07/22/2016      PT End of Session - 07/22/16 1148    Visit Number 7   Number of Visits 12   Date for PT Re-Evaluation 08/12/16   Authorization Type [employer] - no PTA   PT Start Time 1100   PT Stop Time 1144   PT Time Calculation (min) 44 min   Activity Tolerance Patient tolerated treatment well   Behavior During Therapy [facility] for tasks assessed/performed      Past Medical History:  Diagnosis Date  . Arthritis   . CAD (coronary artery disease)    last cath. 07-09-2010  . Cancer [facility])    kidney cancer , prostate cancer   . Chronic kidney disease   . H/O cardiovascular stress test 08-04-2010   ef 55% NO ISCHEMIA  . H/O unilateral nephrectomy    lt. nephrectomy  . Heart attack ([place]) 1993  . Hyperlipemia   . Hypertension    renal dopplers 218.13-normal patency  . Inguinal hernia 08/01/11   right  . Prostate disease    Cancer S/P seed implant    Past Surgical History:  Procedure Laterality Date  . ANGIOPLASTY  1993  . athroscopic knee surgery  2002   right  . bone scan  2005  . CARDIAC CATHETERIZATION  07-09-2010   normal left ventricular function, coronary obsstructive disease with 20% proximal an 30-40% mid lt. anterior descending narrowing ; widely patent stent in the proximal ramus intermediate vessel; 50-70-% narrowing in the mid circumflex,and widely patent stent  in the rt. coronary   . CARDIAC CATHETERIZATION  12-22-2003   widely patent stents with noncritical coronary artery disease and normalLV function  . CHOLECYSTECTOMY  05/05/2009   Laparoscopic  . colonscopy  2004   with biopsy  . CORONARY ANGIOPLASTY WITH STENT PLACEMENT  03-15-1996   pci to  rci and ramus branch using j&j [id number] stent,post dialated  at  20atm (rca)  and 14 atm (ramus branch. ef 55%            . HERNIA REPAIR  08/01/11   RIH  . INGUINAL HERNIA REPAIR Left 03/29/2013   Procedure: HERNIA REPAIR INGUINAL ADULT;  Surgeon: Edward Jolly, MD;  [facility];  Service: General;  Laterality: Left;  . INSERTION OF MESH Left 03/29/2013   Procedure: INSERTION OF MESH;  Surgeon: Edward Jolly, MD;  [facility];  Service: General;  Laterality: Left;  . JOINT REPLACEMENT  2006 and 2010   left 2006, right 2010  . KIDNEY SURGERY  2002 and 2005   2002 - partial removal of left. 2005 complete removal  . PROSTATE BIOPSY    . prostate seed implant    . radiation treatment  2002   prostate  . stent implants  1997    There were no vitals filed for this visit.      Subjective Assessment - 07/22/16 1102    Subjective had some R sided mid back pain up to 8/10 yesterday; not sure from what.  L shoulder feels good though; feels like patch helped   Pertinent History CAD, OA, MI, HTN, HLD, hx prostate and kidney cancer; R shoulder dislocation with very limited motion  Limitations House hold activities   Patient Stated Goals improve pain and motion   Currently in Pain? No/denies                         [facility] Adult PT Treatment/Exercise - 07/22/16 1104      Shoulder Exercises: Supine   Protraction Left;20 reps;Weights   Protraction Weight (lbs) 3   Flexion Left;10 reps   Flexion Limitations with therapist provided resistance with flexion and extension     Shoulder Exercises: Seated   Abduction Left;10 reps   ABduction Limitations eccentric holds at 90 degrees     Shoulder Exercises: Sidelying   ABduction Left;10 reps   ABduction Weight (lbs) 2   ABduction Limitations on foam roll     Shoulder Exercises: Standing   Flexion Left;10 reps   Flexion Limitations wall ladder   Retraction Left;20 reps;Theraband   Theraband Level (Shoulder  Retraction) Level 4 (Blue)     Iontophoresis   Type of Iontophoresis Dexamethasone   Location L shoulder/proximal biceps tendon   Dose 1.0 mL   Time 8 hour patch                     PT Long Term Goals - 07/11/16 1348      PT LONG TERM GOAL #1   Title independent with HEP (08/12/16)   Status On-going     PT LONG TERM GOAL #2   Title improve AROM L shoulder to 100 degrees flexion, 90 degrees abduction and 50 degrees er for improved function (08/12/16)   Baseline flexion and abduction met   Status On-going     PT LONG TERM GOAL #3   Title demonstrate ability to reach light objects (1-2#) overhead without increase in pain for improved function (08/12/16)   Status On-going     PT LONG TERM GOAL #4   Title report ability to sleep on side without pain for improved sleep (08/12/16)   Status On-going               Plan - 07/22/16 1149    Clinical Impression Statement Pt with improved L shoulder pain; R scapular/mid back pain today so held on R UE exercises.  Able to hold flexion and abduction against gravity x 10 reps today with improved control.   PT Treatment/Interventions ADLs/Self Care Home Management;Cryotherapy;Electrical Stimulation;Ultrasound;Moist Heat;Iontophoresis 14m/ml Dexamethasone;Therapeutic activities;Therapeutic exercise;Manual techniques;Passive range of motion;Taping;Vasopneumatic Device;Dry needling   PT Next Visit Plan ROM exercises, isometrics, posture strengthening, eccentric control exercises   Consulted and Agree with Plan of Care Patient      Patient will benefit from skilled therapeutic intervention in order to improve the following deficits and impairments:  Postural dysfunction, Decreased strength, Decreased range of motion, Impaired UE functional use  Visit Diagnosis: Pain in left shoulder  Stiffness of left shoulder, not elsewhere classified  Abnormal posture     Problem List Patient Active Problem List   Diagnosis Date Noted  .  History of nephrectomy- Lt 06/16/2014  . Chronic renal impairment, stage 3 (moderate) 06/16/2014  . CAD-RCA/RI PCI in '97 05/15/2013  . Essential hypertension 05/15/2013  . Hyperlipidemia 05/15/2013       SLaureen Abrahams PT, DPT 07/22/16 11:50 AM    [facility] [place] [address]Phone: [phone]  Fax:  [phone] Name: WGUILFORD SHANNAHANMRN: [id number] of Birth: 3Apr 21, 1930

## 2016-07-26 ENCOUNTER — Ambulatory Visit: Payer: Medicare Other | Admitting: Physical Therapy

## 2016-07-26 DIAGNOSIS — R293 Abnormal posture: Secondary | ICD-10-CM

## 2016-07-26 DIAGNOSIS — M25512 Pain in left shoulder: Secondary | ICD-10-CM

## 2016-07-26 DIAGNOSIS — M25612 Stiffness of left shoulder, not elsewhere classified: Secondary | ICD-10-CM | POA: Diagnosis not present

## 2016-07-26 NOTE — Therapy (Signed)
[facility] [address]  [place], [place], [address] Phone: [phone]   Fax:  [phone]  Physical Therapy Treatment  Patient Details  Name: Donald Todd MRN: [id number] Date of Birth: [birth year] Referring Provider: Dr. Melinda Crutch  Encounter Date: 07/26/2016      PT End of Session - 07/26/16 1358    Visit Number 8   Number of Visits 12   Date for PT Re-Evaluation 08/12/16   Authorization Type [employer] - no PTA   PT Start Time 1318   PT Stop Time 1357   PT Time Calculation (min) 39 min   Activity Tolerance Patient tolerated treatment well   Behavior During Therapy [facility] for tasks assessed/performed      Past Medical History:  Diagnosis Date  . Arthritis   . CAD (coronary artery disease)    last cath. 07-09-2010  . Cancer [facility])    kidney cancer , prostate cancer   . Chronic kidney disease   . H/O cardiovascular stress test 08-04-2010   ef 55% NO ISCHEMIA  . H/O unilateral nephrectomy    lt. nephrectomy  . Heart attack ([place]) 1993  . Hyperlipemia   . Hypertension    renal dopplers 218.13-normal patency  . Inguinal hernia 08/01/11   right  . Prostate disease    Cancer S/P seed implant    Past Surgical History:  Procedure Laterality Date  . ANGIOPLASTY  1993  . athroscopic knee surgery  2002   right  . bone scan  2005  . CARDIAC CATHETERIZATION  07-09-2010   normal left ventricular function, coronary obsstructive disease with 20% proximal an 30-40% mid lt. anterior descending narrowing ; widely patent stent in the proximal ramus intermediate vessel; 50-70-% narrowing in the mid circumflex,and widely patent stent  in the rt. coronary   . CARDIAC CATHETERIZATION  12-22-2003   widely patent stents with noncritical coronary artery disease and normalLV function  . CHOLECYSTECTOMY  05/05/2009   Laparoscopic  . colonscopy  2004   with biopsy  . CORONARY ANGIOPLASTY WITH STENT PLACEMENT  03-15-1996   pci to  rci and ramus branch using j&j [id number] stent,post dialated  at  20atm (rca)  and 14 atm (ramus branch. ef 55%            . HERNIA REPAIR  08/01/11   RIH  . INGUINAL HERNIA REPAIR Left 03/29/2013   Procedure: HERNIA REPAIR INGUINAL ADULT;  Surgeon: Edward Jolly, MD;  Location: [facility];  Service: General;  Laterality: Left;  . INSERTION OF MESH Left 03/29/2013   Procedure: INSERTION OF MESH;  Surgeon: Edward Jolly, MD;  Location: [facility];  Service: General;  Laterality: Left;  . JOINT REPLACEMENT  2006 and 2010   left 2006, right 2010  . KIDNEY SURGERY  2002 and 2005   2002 - partial removal of left. 2005 complete removal  . PROSTATE BIOPSY    . prostate seed implant    . radiation treatment  2002   prostate  . stent implants  1997    There were no vitals filed for this visit.      Subjective Assessment - 07/26/16 1319    Subjective L shoulder is a little sore today.  having "just a little" pain.   Pertinent History CAD, OA, MI, HTN, HLD, hx prostate and kidney cancer; R shoulder dislocation with very limited motion   Limitations House hold activities   Patient Stated Goals improve pain  and motion   Currently in Pain? Yes   Pain Score 4    Pain Location Shoulder   Pain Orientation Left   Pain Descriptors / Indicators Sharp   Pain Type --  subacute   Pain Onset 1 to 4 weeks ago   Pain Frequency Intermittent   Aggravating Factors  reaching overhead   Pain Relieving Factors avoiding reaching            [facility] PT Assessment - 07/26/16 1322      AROM   Left Shoulder Flexion 123 Degrees   Left Shoulder ABduction 55 Degrees                     [facility] Adult PT Treatment/Exercise - 07/26/16 1320      Shoulder Exercises: Supine   External Rotation Left;10 reps;Theraband   Theraband Level (Shoulder External Rotation) Level 2 (Red)   Internal Rotation Left;10 reps;Theraband   Theraband Level (Shoulder Internal Rotation) Level 2 (Red)   Flexion Left;10  reps   Shoulder Flexion Weight (lbs) 2   ABduction Left;10 reps;Weights   Shoulder ABduction Weight (lbs) 1   ABduction Limitations on 1/2 foam roll   Other Supine Exercises Left chest press 2# x 10     Shoulder Exercises: Prone   Retraction Left;10 reps;Weights   Retraction Weight (lbs) 2   Flexion Left;10 reps   Flexion Weight (lbs) 1   Extension Left;10 reps;Weights   Extension Weight (lbs) 2   Horizontal ABduction 1 Left;10 reps;Weights   Horizontal ABduction 1 Weight (lbs) 1     Shoulder Exercises: Standing   Flexion Left;10 reps   Flexion Limitations wall ladder   Other Standing Exercises bicep curls LUE 3# x 10     Shoulder Exercises: ROM/Strengthening   Cybex Row Limitations 15# x 15 both grips   Wall Wash flexion and abduction x 10 with min A     Shoulder Exercises: Isometric Strengthening   ADduction Limitations 10x10" standing with elbow extended     Modalities   Modalities Iontophoresis     Iontophoresis   Type of Iontophoresis Dexamethasone   Location L shoulder/proximal biceps tendon   Dose 1.0 mL   Time 8 hour patch                     PT Long Term Goals - 07/11/16 1348      PT LONG TERM GOAL #1   Title independent with HEP (08/12/16)   Status On-going     PT LONG TERM GOAL #2   Title improve AROM L shoulder to 100 degrees flexion, 90 degrees abduction and 50 degrees er for improved function (08/12/16)   Baseline flexion and abduction met   Status On-going     PT LONG TERM GOAL #3   Title demonstrate ability to reach light objects (1-2#) overhead without increase in pain for improved function (08/12/16)   Status On-going     PT LONG TERM GOAL #4   Title report ability to sleep on side without pain for improved sleep (08/12/16)   Status On-going               Plan - 07/26/16 1358    Clinical Impression Statement Pt reports he feels ionto patch is helping soreness; continues to demonstrate significant shoulder weakness but forward  flexion AROM improving.  Active abduction still limited.   Rehab Potential Good   PT Treatment/Interventions ADLs/Self Care Home Management;Cryotherapy;Electrical Stimulation;Ultrasound;Moist Heat;Iontophoresis 1m/ml Dexamethasone;Therapeutic activities;Therapeutic  exercise;Manual techniques;Passive range of motion;Taping;Vasopneumatic Device;Dry needling   PT Next Visit Plan ROM exercises, isometrics, posture strengthening, eccentric control exercises   Consulted and Agree with Plan of Care Patient      Patient will benefit from skilled therapeutic intervention in order to improve the following deficits and impairments:  Postural dysfunction, Decreased strength, Decreased range of motion, Impaired UE functional use  Visit Diagnosis: Pain in left shoulder  Stiffness of left shoulder, not elsewhere classified  Abnormal posture     Problem List Patient Active Problem List   Diagnosis Date Noted  . History of nephrectomy- Lt 06/16/2014  . Chronic renal impairment, stage 3 (moderate) 06/16/2014  . CAD-RCA/RI PCI in '97 05/15/2013  . Essential hypertension 05/15/2013  . Hyperlipidemia 05/15/2013        Laureen Abrahams, PT, DPT 07/26/16 2:02 PM    [facility] [address]  [address] [place], [place], [address] Phone: [phone]   Fax:  [phone]  Name: Donald Todd MRN: [id number] Date of Birth: 08/12/1929

## 2016-07-29 ENCOUNTER — Ambulatory Visit: Payer: Medicare Other | Admitting: Physical Therapy

## 2016-07-29 DIAGNOSIS — M25612 Stiffness of left shoulder, not elsewhere classified: Secondary | ICD-10-CM | POA: Diagnosis not present

## 2016-07-29 DIAGNOSIS — R293 Abnormal posture: Secondary | ICD-10-CM

## 2016-07-29 DIAGNOSIS — M25512 Pain in left shoulder: Secondary | ICD-10-CM | POA: Diagnosis not present

## 2016-07-29 NOTE — Patient Instructions (Signed)
Flexion - Supine (Dumbbell)    Lie with arms along body. Lift arms and shoulders straight up, palms forward. Repeat __10__ times per set. Do __1-2__ sets per session. Do __5-7__ sessions per week. Use __1-2__ lb weights.   Copyright  VHI. All rights reserved.   External Rotation: Side-Lying (Dumbbell)    Lie with neck supported, left elbow bent to 90, forearm across stomach. Raise forearm, keeping elbow at side. Repeat _10___ times per set. Do __1-2__ sets per session. Do __5-7__ sessions per week. Use __1-2__ lb weight.   Copyright  VHI. All rights reserved.    Abduction - Side-Lying (Dumbbell)    Lie with neck supported, left arm on hip. Lift straight arm toward ceiling. Repeat __10__ times per set. Do _1-2___ sets per session. Do __5-7__ sessions per week. Use __1-2__ lb weight.   Copyright  VHI. All rights reserved.

## 2016-07-29 NOTE — Therapy (Signed)
[facility] [address]  [place], [place], [address] Phone: [phone]   Fax:  [phone]  Physical Therapy Treatment  Patient Details  Name: Donald Todd MRN: [id number] Date of Birth: 09-20-1929 Referring Provider: Dr. Melinda Crutch  Encounter Date: 07/29/2016      PT End of Session - 07/29/16 1154    Visit Number 9   Number of Visits 12   Date for PT Re-Evaluation 08/12/16   Authorization Type [employer] - no PTA   PT Start Time 1110   PT Stop Time 1153   PT Time Calculation (min) 43 min   Activity Tolerance Patient tolerated treatment well   Behavior During Therapy [facility] for tasks assessed/performed      Past Medical History:  Diagnosis Date  . Arthritis   . CAD (coronary artery disease)    last cath. 07-09-2010  . Cancer [facility])    kidney cancer , prostate cancer   . Chronic kidney disease   . H/O cardiovascular stress test 08-04-2010   ef 55% NO ISCHEMIA  . H/O unilateral nephrectomy    lt. nephrectomy  . Heart attack ([place]) 1993  . Hyperlipemia   . Hypertension    renal dopplers 218.13-normal patency  . Inguinal hernia 08/01/11   right  . Prostate disease    Cancer S/P seed implant    Past Surgical History:  Procedure Laterality Date  . ANGIOPLASTY  1993  . athroscopic knee surgery  2002   right  . bone scan  2005  . CARDIAC CATHETERIZATION  07-09-2010   normal left ventricular function, coronary obsstructive disease with 20% proximal an 30-40% mid lt. anterior descending narrowing ; widely patent stent in the proximal ramus intermediate vessel; 50-70-% narrowing in the mid circumflex,and widely patent stent  in the rt. coronary   . CARDIAC CATHETERIZATION  12-22-2003   widely patent stents with noncritical coronary artery disease and normalLV function  . CHOLECYSTECTOMY  05/05/2009   Laparoscopic  . colonscopy  2004   with biopsy  . CORONARY ANGIOPLASTY WITH STENT PLACEMENT  03-15-1996   pci to  rci and ramus branch using j&j [id number] stent,post dialated  at  20atm (rca)  and 14 atm (ramus branch. ef 55%            . HERNIA REPAIR  08/01/11   RIH  . INGUINAL HERNIA REPAIR Left 03/29/2013   Procedure: HERNIA REPAIR INGUINAL ADULT;  Surgeon: Edward Jolly, MD;  [facility];  Service: General;  Laterality: Left;  . INSERTION OF MESH Left 03/29/2013   Procedure: INSERTION OF MESH;  Surgeon: Edward Jolly, MD;  [facility];  Service: General;  Laterality: Left;  . JOINT REPLACEMENT  2006 and 2010   left 2006, right 2010  . KIDNEY SURGERY  2002 and 2005   2002 - partial removal of left. 2005 complete removal  . PROSTATE BIOPSY    . prostate seed implant    . radiation treatment  2002   prostate  . stent implants  1997    There were no vitals filed for this visit.      Subjective Assessment - 07/29/16 1117    Subjective having some L biceps soreness today; otherwise shoulder feels good.   Pertinent History CAD, OA, MI, HTN, HLD, hx prostate and kidney cancer; R shoulder dislocation with very limited motion   Patient Stated Goals improve pain and motion   Currently in Pain? No/denies  [facility] Adult PT Treatment/Exercise - 07/29/16 1118      Shoulder Exercises: Supine   Protraction Left;20 reps;Weights   Protraction Weight (lbs) 5   Horizontal ABduction Left;20 reps;Weights   Horizontal ABduction Weight (lbs) 1   External Rotation Left;20 reps;Theraband   Theraband Level (Shoulder External Rotation) Level 2 (Red)   Internal Rotation Left;20 reps;Theraband   Theraband Level (Shoulder Internal Rotation) Level 2 (Red)   Flexion Left;20 reps;Weights   Shoulder Flexion Weight (lbs) 1   Other Supine Exercises 90 degrees flexion to 0 degrees flexion with red theraband 2x10     Shoulder Exercises: Seated   ABduction Limitations eccentric holds 10x5 sec; AA 2x10 L shoulder     Shoulder Exercises: Sidelying   External  Rotation Left;20 reps;Weights   External Rotation Weight (lbs) 2   ABduction Left;20 reps;Weights   ABduction Weight (lbs) 1     Shoulder Exercises: Standing   External Rotation Left;20 reps;Theraband   Theraband Level (Shoulder External Rotation) Level 1 (Yellow)   Internal Rotation Left;20 reps;Theraband   Theraband Level (Shoulder Internal Rotation) Level 1 (Yellow)   Flexion Left;20 reps;Theraband   Theraband Level (Shoulder Flexion) Level 1 (Yellow)     Iontophoresis   Type of Iontophoresis Dexamethasone   Location L shoulder/proximal biceps tendon   Dose 1.0 mL   Time 8 hour patch                PT Education - 07/29/16 1154    Education provided Yes   Education Details strengthening HEP   Person(s) Educated Patient   Methods Explanation;Demonstration;Handout   Comprehension Verbalized understanding             PT Long Term Goals - 07/11/16 1348      PT LONG TERM GOAL #1   Title independent with HEP (08/12/16)   Status On-going     PT LONG TERM GOAL #2   Title improve AROM L shoulder to 100 degrees flexion, 90 degrees abduction and 50 degrees er for improved function (08/12/16)   Baseline flexion and abduction met   Status On-going     PT LONG TERM GOAL #3   Title demonstrate ability to reach light objects (1-2#) overhead without increase in pain for improved function (08/12/16)   Status On-going     PT LONG TERM GOAL #4   Title report ability to sleep on side without pain for improved sleep (08/12/16)   Status On-going               Plan - 07/29/16 1155    Clinical Impression Statement Pt demonstrating slow and steady progress with increasing strength.  Will continue to benefit from PT to maximize function.   PT Treatment/Interventions ADLs/Self Care Home Management;Cryotherapy;Electrical Stimulation;Ultrasound;Moist Heat;Iontophoresis 78m/ml Dexamethasone;Therapeutic activities;Therapeutic exercise;Manual techniques;Passive range of  motion;Taping;Vasopneumatic Device;Dry needling   PT Next Visit Plan ROM exercises, isometrics, posture strengthening, eccentric control exercises   Consulted and Agree with Plan of Care Patient      Patient will benefit from skilled therapeutic intervention in order to improve the following deficits and impairments:  Postural dysfunction, Decreased strength, Decreased range of motion, Impaired UE functional use  Visit Diagnosis: Pain in left shoulder  Stiffness of left shoulder, not elsewhere classified  Abnormal posture     Problem List Patient Active Problem List   Diagnosis Date Noted  . History of nephrectomy- Lt 06/16/2014  . Chronic renal impairment, stage 3 (moderate) 06/16/2014  . CAD-RCA/RI PCI in '97 05/15/2013  .  Essential hypertension 05/15/2013  . Hyperlipidemia 05/15/2013       Laureen Abrahams, PT, DPT 07/29/16 11:56 AM    [facility] [address]  [place], [place], [address] Phone: [phone]   Fax:  [phone]  Name: Donald Todd MRN: [id number] Date of Birth: 22-Mar-1929

## 2016-08-02 ENCOUNTER — Ambulatory Visit: Payer: Medicare Other | Admitting: Physical Therapy

## 2016-08-02 DIAGNOSIS — M25512 Pain in left shoulder: Secondary | ICD-10-CM | POA: Diagnosis not present

## 2016-08-02 DIAGNOSIS — R293 Abnormal posture: Secondary | ICD-10-CM

## 2016-08-02 DIAGNOSIS — M25612 Stiffness of left shoulder, not elsewhere classified: Secondary | ICD-10-CM

## 2016-08-02 NOTE — Therapy (Signed)
[facility] [address]  [place], [place], [address] Phone: [phone]   Fax:  [phone]  Physical Therapy Treatment  Patient Details  Name: Donald Todd MRN: [id number] Date of Birth: 1928-12-16 Referring Provider: Dr. Melinda Crutch  Encounter Date: 08/02/2016      PT End of Session - 08/02/16 1140    Visit Number 10   Number of Visits 12   Date for PT Re-Evaluation 08/12/16   Authorization Type [employer] - no PTA   PT Start Time 1100   PT Stop Time 1138   PT Time Calculation (min) 38 min   Activity Tolerance Patient tolerated treatment well   Behavior During Therapy [facility] for tasks assessed/performed      Past Medical History:  Diagnosis Date  . Arthritis   . CAD (coronary artery disease)    last cath. 07-09-2010  . Cancer [facility])    kidney cancer , prostate cancer   . Chronic kidney disease   . H/O cardiovascular stress test 08-04-2010   ef 55% NO ISCHEMIA  . H/O unilateral nephrectomy    lt. nephrectomy  . Heart attack ([place]) 1993  . Hyperlipemia   . Hypertension    renal dopplers 218.13-normal patency  . Inguinal hernia 08/01/11   right  . Prostate disease    Cancer S/P seed implant    Past Surgical History:  Procedure Laterality Date  . ANGIOPLASTY  1993  . athroscopic knee surgery  2002   right  . bone scan  2005  . CARDIAC CATHETERIZATION  07-09-2010   normal left ventricular function, coronary obsstructive disease with 20% proximal an 30-40% mid lt. anterior descending narrowing ; widely patent stent in the proximal ramus intermediate vessel; 50-70-% narrowing in the mid circumflex,and widely patent stent  in the rt. coronary   . CARDIAC CATHETERIZATION  12-22-2003   widely patent stents with noncritical coronary artery disease and normalLV function  . CHOLECYSTECTOMY  05/05/2009   Laparoscopic  . colonscopy  2004   with biopsy  . CORONARY ANGIOPLASTY WITH STENT PLACEMENT  03-15-1996   pci to  rci and ramus branch using j&j [id number] stent,post dialated  at  20atm (rca)  and 14 atm (ramus branch. ef 55%            . HERNIA REPAIR  08/01/11   RIH  . INGUINAL HERNIA REPAIR Left 03/29/2013   Procedure: HERNIA REPAIR INGUINAL ADULT;  Surgeon: Edward Jolly, MD;  [facility];  Service: General;  Laterality: Left;  . INSERTION OF MESH Left 03/29/2013   Procedure: INSERTION OF MESH;  Surgeon: Edward Jolly, MD;  [facility];  Service: General;  Laterality: Left;  . JOINT REPLACEMENT  2006 and 2010   left 2006, right 2010  . KIDNEY SURGERY  2002 and 2005   2002 - partial removal of left. 2005 complete removal  . PROSTATE BIOPSY    . prostate seed implant    . radiation treatment  2002   prostate  . stent implants  1997    There were no vitals filed for this visit.      Subjective Assessment - 08/02/16 1100    Subjective shoulder feeling pretty good today   Pertinent History CAD, OA, MI, HTN, HLD, hx prostate and kidney cancer; R shoulder dislocation with very limited motion   Patient Stated Goals improve pain and motion   Currently in Pain? No/denies  [facility] PT Assessment - 08/12/16 1125      AROM   Left Shoulder ABduction 64 Degrees                     [facility] Adult PT Treatment/Exercise - Aug 12, 2016 1103      Shoulder Exercises: Seated   ABduction Limitations eccentric holds 10x5 sec 2 sets; 2nd set with 1#; AA 2x10 L shoulder with elbow flexed     Shoulder Exercises: Prone   Retraction Left;20 reps;Weights   Retraction Weight (lbs) 2   Flexion Left;20 reps;Weights   Flexion Weight (lbs) 1   Extension Left;20 reps;Weights   Extension Weight (lbs) 2   Horizontal ABduction 1 Left;20 reps;Weights   Horizontal ABduction 1 Weight (lbs) 2     Shoulder Exercises: Sidelying   External Rotation Left;10 reps;Weights   External Rotation Weight (lbs) 2   ABduction Left;10 reps   ABduction Limitations therapist provided resistance up  and down   Other Sidelying Exercises circles CW/CCW at 90* abdct x 10 each     Shoulder Exercises: Standing   External Rotation Left;20 reps;Theraband   Theraband Level (Shoulder External Rotation) Level 1 (Yellow)   Internal Rotation Left;20 reps;Theraband   Theraband Level (Shoulder Internal Rotation) Level 1 (Yellow)   Flexion Left;20 reps;Theraband   Theraband Level (Shoulder Flexion) Level 1 (Yellow)   Extension Left;20 reps;Theraband   Theraband Level (Shoulder Extension) Level 1 (Yellow)     Shoulder Exercises: ROM/Strengthening   UBE (Upper Arm Bike) L1.6 x 4 min; 2' fwd / 2' bwd   Rhythmic Stabilization, Supine red medicine ball A-Z                     PT Long Term Goals - 07/11/16 1348      PT LONG TERM GOAL #1   Title independent with HEP (08/12/16)   Status On-going     PT LONG TERM GOAL #2   Title improve AROM L shoulder to 100 degrees flexion, 90 degrees abduction and 50 degrees er for improved function (08/12/16)   Baseline flexion and abduction met   Status On-going     PT LONG TERM GOAL #3   Title demonstrate ability to reach light objects (1-2#) overhead without increase in pain for improved function (08/12/16)   Status On-going     PT LONG TERM GOAL #4   Title report ability to sleep on side without pain for improved sleep (08/12/16)   Status On-going               Plan - 2016-08-12 1141    Clinical Impression Statement Pt with 9 degree improvement in active abduction indicating slowly increasing strength in L shoulder.  Slowly progressing well towards goals with continued focus on building strength in LUE.   PT Treatment/Interventions ADLs/Self Care Home Management;Cryotherapy;Electrical Stimulation;Ultrasound;Moist Heat;Iontophoresis 4m/ml Dexamethasone;Therapeutic activities;Therapeutic exercise;Manual techniques;Passive range of motion;Taping;Vasopneumatic Device;Dry needling   PT Next Visit Plan isometrics, posture strengthening, eccentric  control exercises, continue strengthening   Consulted and Agree with Plan of Care Patient      Patient will benefit from skilled therapeutic intervention in order to improve the following deficits and impairments:  Postural dysfunction, Decreased strength, Decreased range of motion, Impaired UE functional use  Visit Diagnosis: Pain in left shoulder  Stiffness of left shoulder, not elsewhere classified  Abnormal posture       G-Codes - 0September 08, [id number]    Functional Assessment Tool Used clinical judgement; improving AROM indicating improved  strength   Functional Limitation Carrying, moving and handling objects   Carrying, Moving and Handling Objects Current Status [phone]) At least 40 percent but less than 60 percent impaired, limited or restricted   Carrying, Moving and Handling Objects Goal Status ([id number]) At least 20 percent but less than 40 percent impaired, limited or restricted      Problem List Patient Active Problem List   Diagnosis Date Noted  . History of nephrectomy- Lt 06/16/2014  . Chronic renal impairment, stage 3 (moderate) 06/16/2014  . CAD-RCA/RI PCI in '97 05/15/2013  . Essential hypertension 05/15/2013  . Hyperlipidemia 05/15/2013      Laureen Abrahams, PT, DPT 08/02/16 11:44 AM     [facility] [address]  [place], [place], [address] Phone: [phone]   Fax:  [phone]  Name: ARTHUR SPEAGLE MRN: [id number] Date of Birth: 04-22-1929

## 2016-08-16 ENCOUNTER — Ambulatory Visit: Payer: Medicare Other | Attending: Family Medicine | Admitting: Physical Therapy

## 2016-08-16 DIAGNOSIS — R293 Abnormal posture: Secondary | ICD-10-CM | POA: Diagnosis not present

## 2016-08-16 DIAGNOSIS — M25612 Stiffness of left shoulder, not elsewhere classified: Secondary | ICD-10-CM | POA: Insufficient documentation

## 2016-08-16 DIAGNOSIS — M25512 Pain in left shoulder: Secondary | ICD-10-CM | POA: Diagnosis not present

## 2016-08-16 NOTE — Therapy (Signed)
[facility] [address]  [place], [place], [address] Phone: [phone]   Fax:  [phone]  Physical Therapy Treatment  Patient Details  Name: Donald Todd MRN: [id number] Date of Birth: 07-09-1929 Referring Provider: Dr. Melinda Crutch  Encounter Date: 08/16/2016      PT End of Session - 08/16/16 1207    Visit Number 11   Number of Visits 16   Date for PT Re-Evaluation 09/13/16   Authorization Type [employer] - no PTA   PT Start Time 1100   PT Stop Time 1139   PT Time Calculation (min) 39 min   Behavior During Therapy [facility] for tasks assessed/performed      Past Medical History:  Diagnosis Date  . Arthritis   . CAD (coronary artery disease)    last cath. 07-09-2010  . Cancer [facility])    kidney cancer , prostate cancer   . Chronic kidney disease   . H/O cardiovascular stress test 08-04-2010   ef 55% NO ISCHEMIA  . H/O unilateral nephrectomy    lt. nephrectomy  . Heart attack ([place]) 1993  . Hyperlipemia   . Hypertension    renal dopplers 218.13-normal patency  . Inguinal hernia 08/01/11   right  . Prostate disease    Cancer S/P seed implant    Past Surgical History:  Procedure Laterality Date  . ANGIOPLASTY  1993  . athroscopic knee surgery  2002   right  . bone scan  2005  . CARDIAC CATHETERIZATION  07-09-2010   normal left ventricular function, coronary obsstructive disease with 20% proximal an 30-40% mid lt. anterior descending narrowing ; widely patent stent in the proximal ramus intermediate vessel; 50-70-% narrowing in the mid circumflex,and widely patent stent  in the rt. coronary   . CARDIAC CATHETERIZATION  12-22-2003   widely patent stents with noncritical coronary artery disease and normalLV function  . CHOLECYSTECTOMY  05/05/2009   Laparoscopic  . colonscopy  2004   with biopsy  . CORONARY ANGIOPLASTY WITH STENT PLACEMENT  03-15-1996   pci to rci and ramus branch using j&j [id number] stent,post  dialated  at  20atm (rca)  and 14 atm (ramus branch. ef 55%            . HERNIA REPAIR  08/01/11   RIH  . INGUINAL HERNIA REPAIR Left 03/29/2013   Procedure: HERNIA REPAIR INGUINAL ADULT;  Surgeon: Edward Jolly, MD;  Location: [facility];  Service: General;  Laterality: Left;  . INSERTION OF MESH Left 03/29/2013   Procedure: INSERTION OF MESH;  Surgeon: Edward Jolly, MD;  Location: [facility];  Service: General;  Laterality: Left;  . JOINT REPLACEMENT  2006 and 2010   left 2006, right 2010  . KIDNEY SURGERY  2002 and 2005   2002 - partial removal of left. 2005 complete removal  . PROSTATE BIOPSY    . prostate seed implant    . radiation treatment  2002   prostate  . stent implants  1997    There were no vitals filed for this visit.      Subjective Assessment - 08/16/16 1102    Subjective doing well today, no pain.  has a little pain with exercises.     Pertinent History CAD, OA, MI, HTN, HLD, hx prostate and kidney cancer; R shoulder dislocation with very limited motion   Patient Stated Goals improve pain and motion   Currently in Pain? No/denies  [facility] PT Assessment - 08/16/16 1109      AROM   Overall AROM Comments measured supine   Left Shoulder Flexion 140 Degrees  125 sitting   Left Shoulder ABduction 116 Degrees  105 sitting   Left Shoulder External Rotation 37 Degrees     PROM   Left Shoulder External Rotation 50 Degrees     Strength   Left Shoulder Flexion 3-/5   Left Shoulder ABduction 3-/5   Left Shoulder External Rotation 2/5                     [facility] Adult PT Treatment/Exercise - 08/16/16 1104      Shoulder Exercises: Standing   External Rotation Left;20 reps;Theraband;Limitations   Theraband Level (Shoulder External Rotation) Level 2 (Red)   External Rotation Limitations pt only able to achieve to neutral   Internal Rotation Left;20 reps;Theraband   Theraband Level (Shoulder Internal Rotation) Level 2 (Red)   Extension  Left;20 reps;Theraband   Theraband Level (Shoulder Extension) Level 2 (Red)   Retraction Left;20 reps;Theraband   Theraband Level (Shoulder Retraction) Level 2 (Red)   Retraction Limitations with 5 sec hold     Shoulder Exercises: ROM/Strengthening   UBE (Upper Arm Bike) L 2.0 x 6 min (3' fwd / 3' bwd)     Manual Therapy   Manual Therapy Passive ROM   Passive ROM supine L shoulder er, PROM improved to 50 degrees after manual                     PT Long Term Goals - 08/16/16 1208      PT LONG TERM GOAL #1   Title independent with HEP (09/13/16)   Time 4   Period Weeks   Status On-going     PT LONG TERM GOAL #2   Title improve AROM L shoulder to 100 degrees flexion, 90 degrees abduction and 50 degrees er for improved function (08/12/16)   Baseline flexion and abduction met, new goal for er written   Status Partially Met     PT LONG TERM GOAL #3   Title demonstrate ability to reach light objects (1-2#) overhead without increase in pain for improved function (08/12/16)   Status Achieved     PT LONG TERM GOAL #4   Title report ability to sleep on side without pain for improved sleep (09/13/16)   Time 4   Period Weeks   Status On-going     PT LONG TERM GOAL #5   Title improve er AROM to 50 degrees for improved function (09/13/16)   Time 4   Period Weeks   Status New     PT LONG TERM GOAL #6   Title improve L shoulder strength for flexion and abduction to at least 3+/5 for improved function (09/13/16)   Time 4   Period Weeks   Status New               Plan - 08/16/16 1209    Clinical Impression Statement Pt has met 1 LTG and partially met ROM goal, external rotation and strength continue to be limited.  At this time recommend continued OPPT 2x/wk x 3-4 more weeks to address deficits.  Still with slight concern for RTC tear due to limited return of external rotation and abduction.   Rehab Potential Good   PT Frequency 2x / week   PT Duration 4 weeks    PT Treatment/Interventions ADLs/Self Care Home Management;Cryotherapy;Electrical Stimulation;Ultrasound;Moist Heat;Iontophoresis 64m/ml  Dexamethasone;Therapeutic activities;Therapeutic exercise;Manual techniques;Passive range of motion;Taping;Vasopneumatic Device;Dry needling   PT Next Visit Plan continue eccentric control, shoulder strengthening exercises, ROM for er   Consulted and Agree with Plan of Care Patient      Patient will benefit from skilled therapeutic intervention in order to improve the following deficits and impairments:  Postural dysfunction, Decreased strength, Decreased range of motion, Impaired UE functional use  Visit Diagnosis: Pain in left shoulder - Plan: PT plan of care cert/re-cert  Stiffness of left shoulder, not elsewhere classified - Plan: PT plan of care cert/re-cert  Abnormal posture - Plan: PT plan of care cert/re-cert     Problem List Patient Active Problem List   Diagnosis Date Noted  . History of nephrectomy- Lt 06/16/2014  . Chronic renal impairment, stage 3 (moderate) 06/16/2014  . CAD-RCA/RI PCI in '97 05/15/2013  . Essential hypertension 05/15/2013  . Hyperlipidemia 05/15/2013       Laureen Abrahams, PT, DPT 08/16/16 12:15 PM    [facility] [address]  [place], [place], [address] Phone: [phone]   Fax:  [phone]  Name: Donald Todd MRN: [id number] Date of Birth: 1929-08-16

## 2016-08-19 ENCOUNTER — Ambulatory Visit: Payer: Medicare Other | Admitting: Physical Therapy

## 2016-08-19 DIAGNOSIS — M25612 Stiffness of left shoulder, not elsewhere classified: Secondary | ICD-10-CM

## 2016-08-19 DIAGNOSIS — M25512 Pain in left shoulder: Secondary | ICD-10-CM

## 2016-08-19 DIAGNOSIS — R293 Abnormal posture: Secondary | ICD-10-CM | POA: Diagnosis not present

## 2016-08-19 NOTE — Therapy (Signed)
[facility] [address]  [place], [place], [address] Phone: [phone]   Fax:  [phone]  Physical Therapy Treatment  Patient Details  Name: Donald Todd MRN: [id number] Date of Birth: Jan 12, 1929 Referring Provider: Dr. Melinda Crutch  Encounter Date: 08/19/2016      PT End of Session - 08/19/16 1055    Visit Number 12   Number of Visits 16   Date for PT Re-Evaluation 09/13/16   Authorization Type [employer] - no PTA   PT Start Time 1015   PT Stop Time 1054   PT Time Calculation (min) 39 min   Activity Tolerance Patient tolerated treatment well   Behavior During Therapy [facility] for tasks assessed/performed      Past Medical History:  Diagnosis Date  . Arthritis   . CAD (coronary artery disease)    last cath. 07-09-2010  . Cancer [facility])    kidney cancer , prostate cancer   . Chronic kidney disease   . H/O cardiovascular stress test 08-04-2010   ef 55% NO ISCHEMIA  . H/O unilateral nephrectomy    lt. nephrectomy  . Heart attack ([place]) 1993  . Hyperlipemia   . Hypertension    renal dopplers 218.13-normal patency  . Inguinal hernia 08/01/11   right  . Prostate disease    Cancer S/P seed implant    Past Surgical History:  Procedure Laterality Date  . ANGIOPLASTY  1993  . athroscopic knee surgery  2002   right  . bone scan  2005  . CARDIAC CATHETERIZATION  07-09-2010   normal left ventricular function, coronary obsstructive disease with 20% proximal an 30-40% mid lt. anterior descending narrowing ; widely patent stent in the proximal ramus intermediate vessel; 50-70-% narrowing in the mid circumflex,and widely patent stent  in the rt. coronary   . CARDIAC CATHETERIZATION  12-22-2003   widely patent stents with noncritical coronary artery disease and normalLV function  . CHOLECYSTECTOMY  05/05/2009   Laparoscopic  . colonscopy  2004   with biopsy  . CORONARY ANGIOPLASTY WITH STENT PLACEMENT  03-15-1996   pci to  rci and ramus branch using j&j [id number] stent,post dialated  at  20atm (rca)  and 14 atm (ramus branch. ef 55%            . HERNIA REPAIR  08/01/11   RIH  . INGUINAL HERNIA REPAIR Left 03/29/2013   Procedure: HERNIA REPAIR INGUINAL ADULT;  Surgeon: Edward Jolly, MD;  [facility];  Service: General;  Laterality: Left;  . INSERTION OF MESH Left 03/29/2013   Procedure: INSERTION OF MESH;  Surgeon: Edward Jolly, MD;  [facility];  Service: General;  Laterality: Left;  . JOINT REPLACEMENT  2006 and 2010   left 2006, right 2010  . KIDNEY SURGERY  2002 and 2005   2002 - partial removal of left. 2005 complete removal  . PROSTATE BIOPSY    . prostate seed implant    . radiation treatment  2002   prostate  . stent implants  1997    There were no vitals filed for this visit.      Subjective Assessment - 08/19/16 1018    Subjective shoulder is doing well, no pain today   Patient Stated Goals improve pain and motion   Currently in Pain? No/denies                         [facility]  Adult PT Treatment/Exercise - 08/19/16 1018      Shoulder Exercises: Supine   Flexion Left;15 reps;Weights   Shoulder Flexion Weight (lbs) 1   Flexion Limitations attempted 2#; unable to perform with elbow at 0 deg ext     Shoulder Exercises: Seated   Flexion Limitations eccentric holds at 90 degrees; 2# 10x5sec   ABduction Limitations eccentric hold 10x5 sec with 1#     Shoulder Exercises: Prone   Retraction Left;Weights;15 reps   Retraction Weight (lbs) 3   Flexion Left;Weights;15 reps   Flexion Weight (lbs) 2   Extension Left;Weights;15 reps   Extension Weight (lbs) 3   Horizontal ABduction 1 Left;Weights;15 reps   Horizontal ABduction 1 Weight (lbs) 2   Horizontal ABduction 1 Limitations attempted 3# but too much weight     Shoulder Exercises: Sidelying   External Rotation Left;15 reps;Weights   External Rotation Weight (lbs) 2   ABduction Left;15 reps;Weights    ABduction Weight (lbs) 2     Shoulder Exercises: ROM/Strengthening   UBE (Upper Arm Bike) L 2.0 x 6 min (3' fwd / 3' bwd)   Cybex Row Limitations 20# x 15 both grips     Manual Therapy   Passive ROM supine L shoulder er                     PT Long Term Goals - 08/19/16 1055      PT LONG TERM GOAL #1   Title independent with HEP (09/13/16)   Status On-going     PT LONG TERM GOAL #4   Title report ability to sleep on side without pain for improved sleep (09/13/16)   Status On-going     PT LONG TERM GOAL #5   Title improve er AROM to 50 degrees for improved function (09/13/16)   Status On-going     PT LONG TERM GOAL #6   Title improve L shoulder strength for flexion and abduction to at least 3+/5 for improved function (09/13/16)   Status On-going               Plan - 08/19/16 1055    Clinical Impression Statement Pt tolerated exercises well with reports of fatigue; and able to progress some strengthening exercises today.  Continues to be limited with external rotation.  Will continue to benefit from PT to maximize function.   PT Treatment/Interventions ADLs/Self Care Home Management;Cryotherapy;Electrical Stimulation;Ultrasound;Moist Heat;Iontophoresis '4mg'$ /ml Dexamethasone;Therapeutic activities;Therapeutic exercise;Manual techniques;Passive range of motion;Taping;Vasopneumatic Device;Dry needling   PT Next Visit Plan continue eccentric control, shoulder strengthening exercises, ROM for er   Consulted and Agree with Plan of Care Patient      Patient will benefit from skilled therapeutic intervention in order to improve the following deficits and impairments:  Postural dysfunction, Decreased strength, Decreased range of motion, Impaired UE functional use  Visit Diagnosis: Pain in left shoulder  Stiffness of left shoulder, not elsewhere classified  Abnormal posture     Problem List Patient Active Problem List   Diagnosis Date Noted  . History of  nephrectomy- Lt 06/16/2014  . Chronic renal impairment, stage 3 (moderate) 06/16/2014  . CAD-RCA/RI PCI in '97 05/15/2013  . Essential hypertension 05/15/2013  . Hyperlipidemia 05/15/2013       Laureen Abrahams, PT, DPT 08/19/16 10:57 AM    [facility] [address]  [place], [place], [address] Phone: [phone]   Fax:  [phone]  Name: TIRON SUSKI MRN:  [id number] Date of Birth: [birth year]

## 2016-08-23 ENCOUNTER — Ambulatory Visit: Payer: Medicare Other | Admitting: Physical Therapy

## 2016-08-23 DIAGNOSIS — M25512 Pain in left shoulder: Secondary | ICD-10-CM

## 2016-08-23 DIAGNOSIS — M25612 Stiffness of left shoulder, not elsewhere classified: Secondary | ICD-10-CM | POA: Diagnosis not present

## 2016-08-23 DIAGNOSIS — R293 Abnormal posture: Secondary | ICD-10-CM | POA: Diagnosis not present

## 2016-08-23 NOTE — Therapy (Signed)
[facility] [address]  [place], [place], [address] Phone: [phone]   Fax:  [phone]  Physical Therapy Treatment  Patient Details  Name: Donald Todd MRN: [id number] Date of Birth: 13-Oct-1929 Referring Provider: Dr. Melinda Crutch  Encounter Date: 08/23/2016      PT End of Session - 08/23/16 1054    Visit Number 13   Number of Visits 16   Date for PT Re-Evaluation 09/13/16   Authorization Type [employer] - no PTA   PT Start Time 1017   PT Stop Time 1056   PT Time Calculation (min) 39 min   Activity Tolerance Patient tolerated treatment well   Behavior During Therapy [facility] for tasks assessed/performed      Past Medical History:  Diagnosis Date  . Arthritis   . CAD (coronary artery disease)    last cath. 07-09-2010  . Cancer [facility])    kidney cancer , prostate cancer   . Chronic kidney disease   . H/O cardiovascular stress test 08-04-2010   ef 55% NO ISCHEMIA  . H/O unilateral nephrectomy    lt. nephrectomy  . Heart attack ([place]) 1993  . Hyperlipemia   . Hypertension    renal dopplers 218.13-normal patency  . Inguinal hernia 08/01/11   right  . Prostate disease    Cancer S/P seed implant    Past Surgical History:  Procedure Laterality Date  . ANGIOPLASTY  1993  . athroscopic knee surgery  2002   right  . bone scan  2005  . CARDIAC CATHETERIZATION  07-09-2010   normal left ventricular function, coronary obsstructive disease with 20% proximal an 30-40% mid lt. anterior descending narrowing ; widely patent stent in the proximal ramus intermediate vessel; 50-70-% narrowing in the mid circumflex,and widely patent stent  in the rt. coronary   . CARDIAC CATHETERIZATION  12-22-2003   widely patent stents with noncritical coronary artery disease and normalLV function  . CHOLECYSTECTOMY  05/05/2009   Laparoscopic  . colonscopy  2004   with biopsy  . CORONARY ANGIOPLASTY WITH STENT PLACEMENT  03-15-1996   pci to  rci and ramus branch using j&j [id number] stent,post dialated  at  20atm (rca)  and 14 atm (ramus branch. ef 55%            . HERNIA REPAIR  08/01/11   RIH  . INGUINAL HERNIA REPAIR Left 03/29/2013   Procedure: HERNIA REPAIR INGUINAL ADULT;  Surgeon: Edward Jolly, MD;  [facility];  Service: General;  Laterality: Left;  . INSERTION OF MESH Left 03/29/2013   Procedure: INSERTION OF MESH;  Surgeon: Edward Jolly, MD;  [facility]: [facility];  Service: General;  Laterality: Left;  . JOINT REPLACEMENT  2006 and 2010   left 2006, right 2010  . KIDNEY SURGERY  2002 and 2005   2002 - partial removal of left. 2005 complete removal  . PROSTATE BIOPSY    . prostate seed implant    . radiation treatment  2002   prostate  . stent implants  1997    There were no vitals filed for this visit.      Subjective Assessment - 08/23/16 1018    Subjective shoulder is doing well; no pain   Pertinent History CAD, OA, MI, HTN, HLD, hx prostate and kidney cancer; R shoulder dislocation with very limited motion   Patient Stated Goals improve pain and motion   Currently in Pain? No/denies  [facility] Adult PT Treatment/Exercise - 08/23/16 1019      Shoulder Exercises: Seated   Flexion Limitations eccentric holds at 90 degrees; 1# 10x5sec   ABduction Limitations eccentric hold 10x5 sec with 1#; active to 90 x 10 reps with min cues to decrease substitution     Shoulder Exercises: Standing   ABduction AAROM;Left;10 reps   Theraband Level (Shoulder ABduction) Level 1 (Yellow)   ABduction Limitations eccentric lower with yellow theraband; AA for concentric raise   Retraction Left;10 reps;Theraband   Theraband Level (Shoulder Retraction) Level 3 (Green)   Retraction Limitations 10 sec hold     Shoulder Exercises: ROM/Strengthening   UBE (Upper Arm Bike) L 2.0 x 6 min (3' fwd / 3' bwd)   [place] 1# cuff weight flexionx 10; scaption no weight with min A x 10      Manual Therapy   Passive ROM supine L shoulder er                     PT Long Term Goals - 08/19/16 1055      PT LONG TERM GOAL #1   Title independent with HEP (09/13/16)   Status On-going     PT LONG TERM GOAL #4   Title report ability to sleep on side without pain for improved sleep (09/13/16)   Status On-going     PT LONG TERM GOAL #5   Title improve er AROM to 50 degrees for improved function (09/13/16)   Status On-going     PT LONG TERM GOAL #6   Title improve L shoulder strength for flexion and abduction to at least 3+/5 for improved function (09/13/16)   Status On-going               Plan - 08/23/16 1054    Clinical Impression Statement Pt reports he is sleeping about "a little bit" (not quite 2 hours) with some pain so he needs to reposition himself.  Slowly progressing well towards goals.   PT Treatment/Interventions ADLs/Self Care Home Management;Cryotherapy;Electrical Stimulation;Ultrasound;Moist Heat;Iontophoresis 51m/ml Dexamethasone;Therapeutic activities;Therapeutic exercise;Manual techniques;Passive range of motion;Taping;Vasopneumatic Device;Dry needling   PT Next Visit Plan continue eccentric control, shoulder strengthening exercises, ROM for er   Consulted and Agree with Plan of Care Patient      Patient will benefit from skilled therapeutic intervention in order to improve the following deficits and impairments:  Postural dysfunction, Decreased strength, Decreased range of motion, Impaired UE functional use  Visit Diagnosis: Pain in left shoulder  Stiffness of left shoulder, not elsewhere classified  Abnormal posture     Problem List Patient Active Problem List   Diagnosis Date Noted  . History of nephrectomy- Lt 06/16/2014  . Chronic renal impairment, stage 3 (moderate) 06/16/2014  . CAD-RCA/RI PCI in '97 05/15/2013  . Essential hypertension 05/15/2013  . Hyperlipidemia 05/15/2013      SLaureen Abrahams PT,  DPT 08/23/16 10:58 AM    [facility] [place] [address]Phone: [phone]  Fax:  [phone] Name: Donald VANDERWEELEMRN: [id number] of Birth: 305/08/1929

## 2016-08-25 ENCOUNTER — Ambulatory Visit: Payer: Medicare Other | Admitting: Physical Therapy

## 2016-08-25 DIAGNOSIS — R293 Abnormal posture: Secondary | ICD-10-CM | POA: Diagnosis not present

## 2016-08-25 DIAGNOSIS — M25512 Pain in left shoulder: Secondary | ICD-10-CM

## 2016-08-25 DIAGNOSIS — M25612 Stiffness of left shoulder, not elsewhere classified: Secondary | ICD-10-CM | POA: Diagnosis not present

## 2016-08-25 NOTE — Therapy (Addendum)
[facility] [address]  [place], [place], [address] Phone: [phone]   Fax:  [phone]  Physical Therapy Treatment  Patient Details  Name: Donald Todd MRN: [id number] Date of Birth: 01/17/1929 Referring Provider: Dr. Melinda Crutch  Encounter Date: 08/25/2016      PT End of Session - 08/25/16 1142    Visit Number 14   Number of Visits 16   Date for PT Re-Evaluation 09/13/16   Authorization Type [employer] - no PTA   PT Start Time 1101   PT Stop Time 1140   PT Time Calculation (min) 39 min   Activity Tolerance Patient tolerated treatment well   Behavior During Therapy [facility] for tasks assessed/performed      Past Medical History:  Diagnosis Date  . Arthritis   . CAD (coronary artery disease)    last cath. 07-09-2010  . Cancer [facility])    kidney cancer , prostate cancer   . Chronic kidney disease   . H/O cardiovascular stress test 08-04-2010   ef 55% NO ISCHEMIA  . H/O unilateral nephrectomy    lt. nephrectomy  . Heart attack ([place]) 1993  . Hyperlipemia   . Hypertension    renal dopplers 218.13-normal patency  . Inguinal hernia 08/01/11   right  . Prostate disease    Cancer S/P seed implant    Past Surgical History:  Procedure Laterality Date  . ANGIOPLASTY  1993  . athroscopic knee surgery  2002   right  . bone scan  2005  . CARDIAC CATHETERIZATION  07-09-2010   normal left ventricular function, coronary obsstructive disease with 20% proximal an 30-40% mid lt. anterior descending narrowing ; widely patent stent in the proximal ramus intermediate vessel; 50-70-% narrowing in the mid circumflex,and widely patent stent  in the rt. coronary   . CARDIAC CATHETERIZATION  12-22-2003   widely patent stents with noncritical coronary artery disease and normalLV function  . CHOLECYSTECTOMY  05/05/2009   Laparoscopic  . colonscopy  2004   with biopsy  . CORONARY ANGIOPLASTY WITH STENT PLACEMENT  03-15-1996   pci to  rci and ramus branch using j&j [id number] stent,post dialated  at  20atm (rca)  and 14 atm (ramus branch. ef 55%            . HERNIA REPAIR  08/01/11   RIH  . INGUINAL HERNIA REPAIR Left 03/29/2013   Procedure: HERNIA REPAIR INGUINAL ADULT;  Surgeon: Edward Jolly, MD;  Location: [facility];  Service: General;  Laterality: Left;  . INSERTION OF MESH Left 03/29/2013   Procedure: INSERTION OF MESH;  Surgeon: Edward Jolly, MD;  Location: [facility];  Service: General;  Laterality: Left;  . JOINT REPLACEMENT  2006 and 2010   left 2006, right 2010  . KIDNEY SURGERY  2002 and 2005   2002 - partial removal of left. 2005 complete removal  . PROSTATE BIOPSY    . prostate seed implant    . radiation treatment  2002   prostate  . stent implants  1997    There were no vitals filed for this visit.      Subjective Assessment - 08/25/16 1100    Subjective woke up with a R sided neck pain, seems to be resolved now.  Left shoulder feels pretty good.   Pertinent History CAD, OA, MI, HTN, HLD, hx prostate and kidney cancer; R shoulder dislocation with very limited motion   Limitations House hold activities  Diagnostic tests none   Patient Stated Goals improve pain and motion   Currently in Pain? No/denies                         [facility] Adult PT Treatment/Exercise - 08/25/16 1103      Shoulder Exercises: Supine   Protraction Left;15 reps;Weights   Protraction Weight (lbs) 5   Horizontal ABduction Left;15 reps;Weights   Horizontal ABduction Weight (lbs) 1   Horizontal ABduction Limitations attempted theraband; pt unable   Flexion Left;15 reps;Weights   Shoulder Flexion Weight (lbs) 1   Other Supine Exercises flexion, abdct and er isometrics 10x5 sec; resistance through motion as well     Shoulder Exercises: Seated   Other Seated Exercises attempted diagonals with/without weight - pt unable     Shoulder Exercises: Sidelying   External Rotation Left;15 reps;Weights   External  Rotation Weight (lbs) 2   ABduction Left;15 reps;Weights   ABduction Weight (lbs) 1   ABduction Limitations 2 sets     Shoulder Exercises: Standing   Retraction Left;10 reps;Theraband   Theraband Level (Shoulder Retraction) Level 4 (Blue)   Retraction Limitations 10 sec hold   Other Standing Exercises wall ladder flexion x 10 with 2# cuff weight; scaption x 8 with 1# cuff weight     Shoulder Exercises: ROM/Strengthening   UBE (Upper Arm Bike) L 2.5 x 6 min (3' fwd / 3' bwd)     Manual Therapy   Passive ROM supine L shoulder er                     PT Long Term Goals - 08/19/16 1055      PT LONG TERM GOAL #1   Title independent with HEP (09/13/16)   Status On-going     PT LONG TERM GOAL #4   Title report ability to sleep on side without pain for improved sleep (09/13/16)   Status On-going     PT LONG TERM GOAL #5   Title improve er AROM to 50 degrees for improved function (09/13/16)   Status On-going     PT LONG TERM GOAL #6   Title improve L shoulder strength for flexion and abduction to at least 3+/5 for improved function (09/13/16)   Status On-going      Late Entry G Code: [place] from 08/25/2016 in [facility]  Functional Assessment Tool Used clinical judgement  Functional Limitation Carrying, moving and handling objects  Carrying, Moving and Handling Objects Goal Status (P8242) CJ  Carrying, Moving and Handling Objects Discharge Status [phone]) CK            Plan - 08/25/16 1142    Clinical Impression Statement Pt tolerated exercises well; continues to have weakness in L shoulder affecting function.  Will continue to progress strengthening exercises as able.   PT Treatment/Interventions ADLs/Self Care Home Management;Cryotherapy;Electrical Stimulation;Ultrasound;Moist Heat;Iontophoresis 23m/ml Dexamethasone;Therapeutic activities;Therapeutic exercise;Manual techniques;Passive range of  motion;Taping;Vasopneumatic Device;Dry needling   PT Next Visit Plan continue eccentric control, shoulder strengthening exercises, ROM for er   Consulted and Agree with Plan of Care Patient      Patient will benefit from skilled therapeutic intervention in order to improve the following deficits and impairments:  Postural dysfunction, Decreased strength, Decreased range of motion, Impaired UE functional use  Visit Diagnosis: Pain in left shoulder  Stiffness of left shoulder, not elsewhere classified  Abnormal posture     Problem List Patient  Active Problem List   Diagnosis Date Noted  . History of nephrectomy- Lt 06/16/2014  . Chronic renal impairment, stage 3 (moderate) 06/16/2014  . CAD-RCA/RI PCI in '97 05/15/2013  . Essential hypertension 05/15/2013  . Hyperlipidemia 05/15/2013       Laureen Abrahams, PT, DPT 08/25/16 11:45 AM    [facility] [address]  [place], [place], [address] Phone: [phone]   Fax:  [phone]  Name: Donald Todd MRN: [id number] Date of Birth: [birth year]      PHYSICAL THERAPY DISCHARGE SUMMARY  Visits from Start of Care: 14  Current functional level related to goals / functional outcomes: See above; no goals met at pt had 3 falls after this session and referred to neurologist and orthopedic surgeon.  MRI of brain and L shoulder scheduled.   Remaining deficits: Balance deficits and L shoulder injury after fall.   Education / Equipment: HEP  Plan: Patient agrees to discharge.  Patient goals were not met. Patient is being discharged due to a change in medical status.  ?????      Laureen Abrahams, PT, DPT 09/28/16 7:34 AM  [facility] [place], [place] [address]  [phone] (office) [phone] (fax)

## 2016-08-29 ENCOUNTER — Emergency Department (HOSPITAL_BASED_OUTPATIENT_CLINIC_OR_DEPARTMENT_OTHER)
Admission: EM | Admit: 2016-08-29 | Discharge: 2016-08-29 | Disposition: A | Discharge status: Home / Self Care | Payer: Medicare Other | Attending: Emergency Medicine | Admitting: Emergency Medicine

## 2016-08-29 ENCOUNTER — Encounter (HOSPITAL_BASED_OUTPATIENT_CLINIC_OR_DEPARTMENT_OTHER): Payer: Self-pay | Admitting: *Deleted

## 2016-08-29 ENCOUNTER — Emergency Department (HOSPITAL_BASED_OUTPATIENT_CLINIC_OR_DEPARTMENT_OTHER): Payer: Medicare Other

## 2016-08-29 DIAGNOSIS — Y939 Activity, unspecified: Secondary | ICD-10-CM | POA: Insufficient documentation

## 2016-08-29 DIAGNOSIS — Z85528 Personal history of other malignant neoplasm of kidney: Secondary | ICD-10-CM | POA: Diagnosis not present

## 2016-08-29 DIAGNOSIS — N183 Chronic kidney disease, stage 3 (moderate): Secondary | ICD-10-CM | POA: Diagnosis not present

## 2016-08-29 DIAGNOSIS — M25512 Pain in left shoulder: Secondary | ICD-10-CM

## 2016-08-29 DIAGNOSIS — I2510 Atherosclerotic heart disease of native coronary artery without angina pectoris: Secondary | ICD-10-CM | POA: Insufficient documentation

## 2016-08-29 DIAGNOSIS — I1310 Hypertensive heart and chronic kidney disease without heart failure, with stage 1 through stage 4 chronic kidney disease, or unspecified chronic kidney disease: Secondary | ICD-10-CM | POA: Diagnosis not present

## 2016-08-29 DIAGNOSIS — Z87891 Personal history of nicotine dependence: Secondary | ICD-10-CM | POA: Diagnosis not present

## 2016-08-29 DIAGNOSIS — Z23 Encounter for immunization: Secondary | ICD-10-CM | POA: Insufficient documentation

## 2016-08-29 DIAGNOSIS — S61412A Laceration without foreign body of left hand, initial encounter: Secondary | ICD-10-CM | POA: Diagnosis not present

## 2016-08-29 DIAGNOSIS — Y929 Unspecified place or not applicable: Secondary | ICD-10-CM | POA: Diagnosis not present

## 2016-08-29 DIAGNOSIS — Z79899 Other long term (current) drug therapy: Secondary | ICD-10-CM | POA: Insufficient documentation

## 2016-08-29 DIAGNOSIS — T148 Other injury of unspecified body region: Secondary | ICD-10-CM | POA: Diagnosis not present

## 2016-08-29 DIAGNOSIS — M25522 Pain in left elbow: Secondary | ICD-10-CM | POA: Diagnosis not present

## 2016-08-29 DIAGNOSIS — Y999 Unspecified external cause status: Secondary | ICD-10-CM | POA: Insufficient documentation

## 2016-08-29 DIAGNOSIS — W11XXXA Fall on and from ladder, initial encounter: Secondary | ICD-10-CM | POA: Insufficient documentation

## 2016-08-29 DIAGNOSIS — W19XXXA Unspecified fall, initial encounter: Secondary | ICD-10-CM

## 2016-08-29 DIAGNOSIS — S6992XA Unspecified injury of left wrist, hand and finger(s), initial encounter: Secondary | ICD-10-CM | POA: Diagnosis present

## 2016-08-29 MED ORDER — TETANUS-DIPHTH-ACELL PERTUSSIS 5-2.5-18.5 LF-MCG/0.5 IM SUSP
0.5000 mL | Freq: Once | INTRAMUSCULAR | Status: AC
  Administered 2016-08-29: 0.5 mL via INTRAMUSCULAR
  Filled 2016-08-29: qty 0.5

## 2016-08-29 NOTE — ED Provider Notes (Signed)
[facility] DEPT [facility] Provider Note   CSN: [id number] Arrival date & time: 08/29/16  1732  By signing my name below, I, Hansel Feinstein, attest that this documentation has been prepared under the direction and in the presence of [employer], PA-C. Electronically Signed: Hansel Feinstein, ED Scribe. 08/29/16. 5:54 PM.    History   Chief Complaint Chief Complaint  Patient presents with  . Fall    HPI Donald Todd is a 82 y.o. male brought in by ambulance who presents to the Emergency Department complaining of moderate left shoulder pain s/p fall that occurred earlier today. Pt states that he was on the second step of a step ladder, thought he was on the bottom step, and subsequently stepped off, falling backward and landing on his outstretched left arm. He denies head injury or LOC. Pt is ambulatory without difficulty. Pt also notes he has an an abrasion to his left hand, but denies significant pain to the hand. He states his shoulder pain is worsened with arm movement. Pt takes qd ASA. He denies CP, SOB, abdominal pain, hip pain, additional injuries.    The history is provided by the patient and the spouse. No language interpreter was used.    Past Medical History:  Diagnosis Date  . Arthritis   . CAD (coronary artery disease)    last cath. 07-09-2010  . Cancer [facility])    kidney cancer , prostate cancer   . Chronic kidney disease   . H/O cardiovascular stress test 08-04-2010   ef 55% NO ISCHEMIA  . H/O unilateral nephrectomy    lt. nephrectomy  . Heart attack ([place]) 1993  . Hyperlipemia   . Hypertension    renal dopplers 218.13-normal patency  . Inguinal hernia 08/01/11   right  . Prostate disease    Cancer S/P seed implant    Patient Active Problem List   Diagnosis Date Noted  . History of nephrectomy- Lt 06/16/2014  . Chronic renal impairment, stage 3 (moderate) 06/16/2014  . CAD-RCA/RI PCI in '97 05/15/2013  . Essential hypertension 05/15/2013  . Hyperlipidemia  05/15/2013    Past Surgical History:  Procedure Laterality Date  . ANGIOPLASTY  1993  . athroscopic knee surgery  2002   right  . bone scan  2005  . CARDIAC CATHETERIZATION  07-09-2010   normal left ventricular function, coronary obsstructive disease with 20% proximal an 30-40% mid lt. anterior descending narrowing ; widely patent stent in the proximal ramus intermediate vessel; 50-70-% narrowing in the mid circumflex,and widely patent stent  in the rt. coronary   . CARDIAC CATHETERIZATION  12-22-2003   widely patent stents with noncritical coronary artery disease and normalLV function  . CHOLECYSTECTOMY  05/05/2009   Laparoscopic  . colonscopy  2004   with biopsy  . CORONARY ANGIOPLASTY WITH STENT PLACEMENT  03-15-1996   pci to rci and ramus branch using j&j [id number] stent,post dialated  at  20atm (rca)  and 14 atm (ramus branch. ef 55%            . HERNIA REPAIR  08/01/11   RIH  . INGUINAL HERNIA REPAIR Left 03/29/2013   Procedure: HERNIA REPAIR INGUINAL ADULT;  Surgeon: Edward Jolly, MD;  [facility];  Service: General;  Laterality: Left;  . INSERTION OF MESH Left 03/29/2013   Procedure: INSERTION OF MESH;  Surgeon: Edward Jolly, MD;  Location: [facility];  Service: General;  Laterality: Left;  . JOINT REPLACEMENT  2006 and 2010   left  2006, right 2010  . KIDNEY SURGERY  2002 and 2005   2002 - partial removal of left. 2005 complete removal  . PROSTATE BIOPSY    . prostate seed implant    . radiation treatment  2002   prostate  . stent implants  1997       Home Medications    Prior to Admission medications   Medication Sig Start Date End Date Taking? Authorizing Provider  amLODipine (NORVASC) 2.5 MG tablet Take 3 tablets (7.5 mg total) by mouth daily. 02/08/16   Lorretta Harp, MD  amoxicillin (AMOXIL) 500 MG capsule Take 2,000 mg by mouth as needed. One hour before dental procedure    Historical Provider, MD  aspirin 81 MG tablet Take 81 mg by mouth daily.      Historical Provider, MD  atorvastatin (LIPITOR) 40 MG tablet TAKE 1 TABLET AT BEDTIME 02/22/16   Lorretta Harp, MD  cholecalciferol (VITAMIN D) 1000 units tablet Take 1,000 Units by mouth daily.    Historical Provider, MD  enalapril (VASOTEC) 10 MG tablet Take 1 tablet (10 mg total) by mouth daily. 02/08/16   Lorretta Harp, MD  hydrOXYzine (ATARAX/VISTARIL) 25 MG tablet Take 25 mg by mouth every evening.    Historical Provider, MD  isosorbide mononitrate (IMDUR) 30 MG 24 hr tablet TAKE 3 TABLETS EVERY MORNING AND 1 TABLET EVERY EVENING 06/14/16   Lorretta Harp, MD  loratadine (CLARITIN) 10 MG tablet Take 10 mg by mouth daily.    Historical Provider, MD  Multiple Vitamins-Minerals (OCUVITE PO) Take 1 capsule by mouth 2 (two) times daily.     Historical Provider, MD  nitroGLYCERIN (NITROSTAT) 0.4 MG SL tablet Place 1 tablet (0.4 mg total) under the tongue as needed. 12/17/14   Lorretta Harp, MD  omeprazole (PRILOSEC) 20 MG capsule TAKE 1 CAPSULE DAILY 01/14/16   Lorretta Harp, MD  TOPROL XL 25 MG 24 hr tablet TAKE 1 TABLET TWICE A DAY WITH OR IMMEDIATELY FOLLOWING A MEAL 02/08/16   Lorretta Harp, MD    Family History Family History  Problem Relation Age of Onset  . Cancer Mother     stomach?  . Stroke Father   . Stroke Brother     Social History Social History  Substance Use Topics  . Smoking status: Former Smoker    Types: Cigarettes, Cigars    Quit date: 12/14/1963  . Smokeless tobacco: Never Used  . Alcohol use No     Allergies   Tape; Iohexol; and Oxycodone   Review of Systems Review of Systems  All other systems reviewed and are negative.    Physical Exam Updated Vital Signs BP 145/73 (BP Location: Right Arm)   Pulse (!) 57   Temp 98.4 F (36.9 C) (Oral)   Resp 20   Ht '5\' 7"'  (1.702 m)   Wt 76.7 kg   SpO2 96%   BMI 26.47 kg/m   Physical Exam  Constitutional: He is oriented to person, place, and time. He appears well-developed and well-nourished.    HENT:  Head: Normocephalic and atraumatic.  Eyes: Conjunctivae are normal.  Neck: Normal range of motion.  Cardiovascular: Normal rate.   Pulmonary/Chest: Effort normal and breath sounds normal. No respiratory distress. He exhibits no tenderness.  Chest wall nontender. Normal lung expansion.   Abdominal: Soft. He exhibits no distension. There is no tenderness.  Musculoskeletal: Normal range of motion. He exhibits tenderness. He exhibits no deformity.  Skin tears and hematoma  to the left dorsal hand. Skin tears and hematoma to the left olecranon process.   Pain with ROM of the left elbow. TTP of the left anterior shoulder. No obvious bruising or deformity.   Right arm atraumatic with normal ROM.   Hips stable. BLE non-tender with normal ROM. No pain with internal or external rotation of the lower extremities.  No C, T or L spine tenderness. Spine is atraumatic.   Neurological: He is alert and oriented to person, place, and time. No cranial nerve deficit.  Skin: Skin is warm and dry.  Psychiatric: He has a normal mood and affect. His behavior is normal.  Nursing note and vitals reviewed.    ED Treatments / Results   DIAGNOSTIC STUDIES: Oxygen Saturation is 95% on RA, adequate by my interpretation.    COORDINATION OF CARE: 5:51 PM Discussed treatment plan with pt at bedside which includes XR and pt agreed to plan.    Labs (all labs ordered are listed, but only abnormal results are displayed) Labs Reviewed - No data to display  EKG  EKG Interpretation None       Radiology Dg Elbow Complete Left  Result Date: 08/29/2016 CLINICAL DATA:  Status post fall on left shoulder while coming down ladder, with left elbow pain. Initial encounter. EXAM: LEFT ELBOW - COMPLETE 3+ VIEW COMPARISON:  None. FINDINGS: There is no evidence of fracture or dislocation. The visualized joint spaces are preserved. A small degenerative osseous fragment is noted at the medial epicondyle. No  significant joint effusion is identified. The soft tissues are unremarkable in appearance. IMPRESSION: No evidence of fracture or dislocation. Electronically Signed   By: Garald Balding M.D.   On: 08/29/2016 18:55   Dg Shoulder Left  Result Date: 08/29/2016 CLINICAL DATA:  [place] [address] and hit left shoulder on ground while coming down ladder. Anterior left shoulder pain and limited range of motion. Initial encounter. EXAM: LEFT SHOULDER - 2+ VIEW COMPARISON:  None. FINDINGS: There is no evidence of fracture or dislocation. The left humeral head is seated within the glenoid fossa. Mild degenerative change is noted at the left acromioclavicular joint. No significant soft tissue abnormalities are seen. The visualized portions of the left lung are clear. IMPRESSION: No evidence of fracture or dislocation. Electronically Signed   By: Garald Balding M.D.   On: 08/29/2016 18:54    Procedures Procedures (including critical care time)  Medications Ordered in ED Medications  Tdap (BOOSTRIX) injection 0.5 mL (0.5 mLs Intramuscular Given 08/29/16 1952)     Initial Impression / Assessment and Plan / ED Course  I have reviewed the triage vital signs and the nursing notes.  Pertinent labs & imaging results that were available during my care of the patient were reviewed by me and considered in my medical decision making (see chart for details).  Clinical Course    Labs: None  Imaging: XR left shoulder, left elbow complete   Consults: None  Therapeutics: shoulder sling  Discharge Meds: None  Assessment/Plan: Patient X-Ray negative for obvious fracture or dislocation.No neurologic deficits, patient did not strike his head.  Tetanus updated.  No suturable lacerations.  Pt advised to follow up with orthopedics. Patient given shoulder immobilizer while in ED, conservative therapy recommended and discussed. Patient will be discharged home & is agreeable with above plan. Returns precautions discussed. Pt appears  safe for discharge.     Final Clinical Impressions(s) / ED Diagnoses   Final diagnoses:  Fall, initial encounter  Left shoulder pain  Elbow pain, left    New Prescriptions New Prescriptions   No medications on file    I personally performed the services described in this documentation, which was scribed in my presence. The recorded information has been reviewed and is accurate.   Okey Regal, PA-C 08/29/16 2209    Everlene Balls, MD 08/29/16 7342    Everlene Balls, MD 08/29/16 2352

## 2016-08-29 NOTE — ED Triage Notes (Signed)
EMS transport from home. Reports he missed last step of step ladder and fell landing on left shoulder. Swathe in place by EMS. Caox 4. Has skin tear on left elbow

## 2016-08-29 NOTE — Discharge Instructions (Signed)
Please follow-up with orthopedic surgeon for reevaluation for the management.  Return to emergency room immediately if he exhibits any new or worsening signs or symptoms

## 2016-08-30 ENCOUNTER — Telehealth: Payer: Self-pay | Admitting: Physical Therapy

## 2016-08-30 ENCOUNTER — Ambulatory Visit: Payer: Medicare Other

## 2016-08-30 NOTE — Telephone Encounter (Signed)
Dr. Harrington Challenger-  Pt went to ED for fall off step ladder yesterday.  He fell onto his left shoulder and arrived to PT today with LUE swelling and bruising.  X-Rays negative for fx or dislocation in ED.  We agreed to cancel today's PT appointment and to allow him a couple days to recover.  I'll plan to see him Friday and reassess.  He has significant weakness in his LUE which is concerning for possible RTC involvement.    Would it be possible to make a referral to an orthopedic MD?  He mentioned one of the ED MD's recommended it as well.   Thanks so much- Laureen Abrahams, PT, DPT 08/30/16 11:05 AM

## 2016-09-01 DIAGNOSIS — M6281 Muscle weakness (generalized): Secondary | ICD-10-CM | POA: Diagnosis not present

## 2016-09-01 DIAGNOSIS — R296 Repeated falls: Secondary | ICD-10-CM | POA: Diagnosis not present

## 2016-09-01 DIAGNOSIS — T148 Other injury of unspecified body region: Secondary | ICD-10-CM | POA: Diagnosis not present

## 2016-09-07 ENCOUNTER — Encounter: Payer: Self-pay | Admitting: Neurology

## 2016-09-07 ENCOUNTER — Ambulatory Visit (INDEPENDENT_AMBULATORY_CARE_PROVIDER_SITE_OTHER): Payer: Medicare Other | Admitting: Neurology

## 2016-09-07 VITALS — BP 124/72 | HR 75 | Ht 66.5 in | Wt 165.0 lb

## 2016-09-07 DIAGNOSIS — E538 Deficiency of other specified B group vitamins: Secondary | ICD-10-CM | POA: Diagnosis not present

## 2016-09-07 DIAGNOSIS — I2583 Coronary atherosclerosis due to lipid rich plaque: Secondary | ICD-10-CM

## 2016-09-07 DIAGNOSIS — I2510 Atherosclerotic heart disease of native coronary artery without angina pectoris: Secondary | ICD-10-CM | POA: Diagnosis not present

## 2016-09-07 DIAGNOSIS — R269 Unspecified abnormalities of gait and mobility: Secondary | ICD-10-CM

## 2016-09-07 NOTE — Patient Instructions (Addendum)
Fall Prevention in the Home  Falls can cause injuries and can affect people from all age groups. There are many simple things that you can do to make your home safe and to help prevent falls. WHAT CAN I DO ON THE OUTSIDE OF MY HOME?  Regularly repair the edges of walkways and driveways and fix any cracks.  Remove high doorway thresholds.  Trim any shrubbery on the main path into your home.  Use bright outdoor lighting.  Clear walkways of debris and clutter, including tools and rocks.  Regularly check that handrails are securely fastened and in good repair. Both sides of any steps should have handrails.  Install guardrails along the edges of any raised decks or porches.  Have leaves, snow, and ice cleared regularly.  Use sand or salt on walkways during winter months.  In the garage, clean up any spills right away, including grease or oil spills. WHAT CAN I DO IN THE BATHROOM?  Use night lights.  Install grab bars by the toilet and in the tub and shower. Do not use towel bars as grab bars.  Use non-skid mats or decals on the floor of the tub or shower.  If you need to sit down while you are in the shower, use a plastic, non-slip stool..  Keep the floor dry. Immediately clean up any water that spills on the floor.  Remove soap buildup in the tub or shower on a regular basis.  Attach bath mats securely with double-sided non-slip rug tape.  Remove throw rugs and other tripping hazards from the floor. WHAT CAN I DO IN THE BEDROOM?  Use night lights.  Make sure that a bedside light is easy to reach.  Do not use oversized bedding that drapes onto the floor.  Have a firm chair that has side arms to use for getting dressed.  Remove throw rugs and other tripping hazards from the floor. WHAT CAN I DO IN THE KITCHEN?   Clean up any spills right away.  Avoid walking on wet floors.  Place frequently used items in easy-to-reach places.  If you need to reach for something  above you, use a sturdy step stool that has a grab bar.  Keep electrical cables out of the way.  Do not use floor polish or wax that makes floors slippery. If you have to use wax, make sure that it is non-skid floor wax.  Remove throw rugs and other tripping hazards from the floor. WHAT CAN I DO IN THE STAIRWAYS?  Do not leave any items on the stairs.  Make sure that there are handrails on both sides of the stairs. Fix handrails that are broken or loose. Make sure that handrails are as long as the stairways.  Check any carpeting to make sure that it is firmly attached to the stairs. Fix any carpet that is loose or worn.  Avoid having throw rugs at the top or bottom of stairways, or secure the rugs with carpet tape to prevent them from moving.  Make sure that you have a light switch at the top of the stairs and the bottom of the stairs. If you do not have them, have them installed. WHAT ARE SOME OTHER FALL PREVENTION TIPS?  Wear closed-toe shoes that fit well and support your feet. Wear shoes that have rubber soles or low heels.  When you use a stepladder, make sure that it is completely opened and that the sides are firmly locked. Have someone hold the ladder while you   are using it. Do not climb a closed stepladder.  Add color or contrast paint or tape to grab bars and handrails in your home. Place contrasting color strips on the first and last steps.  Use mobility aids as needed, such as canes, walkers, scooters, and crutches.  Turn on lights if it is dark. Replace any light bulbs that burn out.  Set up furniture so that there are clear paths. Keep the furniture in the same spot.  Fix any uneven floor surfaces.  Choose a carpet design that does not hide the edge of steps of a stairway.  Be aware of any and all pets.  Review your medicines with your healthcare provider. Some medicines can cause dizziness or changes in blood pressure, which increase your risk of falling. Talk  with your health care provider about other ways that you can decrease your risk of falls. This may include working with a physical therapist or trainer to improve your strength, balance, and endurance.   This information is not intended to replace advice given to you by your health care provider. Make sure you discuss any questions you have with your health care provider.   Document Released: 11/11/2002 Document Revised: 04/07/2015 Document Reviewed: 12/26/2014 Elsevier Interactive Patient Education 2016 [employer]  

## 2016-09-07 NOTE — Progress Notes (Signed)
Reason for visit: Gait disorder  Referring physician: Dr. Thurnell Lose Donald Todd is a 82 y.o. male  History of present illness:  Donald Todd is an 82 year old right-handed white male with a history of a gait disturbance that he believes has been present for about 6 months. He reports some episodes of dizziness or even vertigo when he stands up, occasionally may have some problems with sitting or even lying down. He has fallen 3 times within the last week. One fall was associated with being up on a stepladder. The patient has injured his left shoulder with the fall, he is being followed through orthopedic surgery. The patient denies any headaches, vision changes, or numbness or weakness of extremities. The patient denies problems with neck pain or back pain or difficulty controlling the bowels or the bladder. He does report some mild memory issues, he has difficulty remembering names for people. Occasionally since the left shoulder injury he may have some tingling of the left hand. The patient has just recently started using a cane for ambulation. He is sent to this office for further evaluation of his walking problems.  Past Medical History:  Diagnosis Date  . Arthritis   . CAD (coronary artery disease)    last cath. 07-09-2010  . Cancer [facility])    kidney cancer , prostate cancer   . Chronic kidney disease   . H/O cardiovascular stress test 08-04-2010   ef 55% NO ISCHEMIA  . H/O unilateral nephrectomy    lt. nephrectomy  . Heart attack 1993  . Hyperlipemia   . Hypertension    renal dopplers 218.13-normal patency  . Inguinal hernia 08/01/11   right  . Prostate disease    Cancer S/P seed implant    Past Surgical History:  Procedure Laterality Date  . ANGIOPLASTY  1993  . athroscopic knee surgery  2002   right  . bone scan  2005  . CARDIAC CATHETERIZATION  07-09-2010   normal left ventricular function, coronary obsstructive disease with 20% proximal an 30-40% mid lt. anterior  descending narrowing ; widely patent stent in the proximal ramus intermediate vessel; 50-70-% narrowing in the mid circumflex,and widely patent stent  in the rt. coronary   . CARDIAC CATHETERIZATION  12-22-2003   widely patent stents with noncritical coronary artery disease and normalLV function  . CHOLECYSTECTOMY  05/05/2009   Laparoscopic  . colonscopy  2004   with biopsy  . CORONARY ANGIOPLASTY WITH STENT PLACEMENT  03-15-1996   pci to rci and ramus branch using j&j [id number] stent,post dialated  at  20atm (rca)  and 14 atm (ramus branch. ef 55%            . HERNIA REPAIR  08/01/11   RIH  . INGUINAL HERNIA REPAIR Left 03/29/2013   Procedure: HERNIA REPAIR INGUINAL ADULT;  Surgeon: Edward Jolly, MD;  Location: [facility];  Service: General;  Laterality: Left;  . INSERTION OF MESH Left 03/29/2013   Procedure: INSERTION OF MESH;  Surgeon: Edward Jolly, MD;  Location: [facility];  Service: General;  Laterality: Left;  . JOINT REPLACEMENT  2006 and 2010   left 2006, right 2010  . KIDNEY SURGERY  2002 and 2005   2002 - partial removal of left. 2005 complete removal  . PROSTATE BIOPSY    . prostate seed implant    . radiation treatment  2002   prostate  . stent implants  1997    Family History  Problem Relation Age  of Onset  . Cancer Mother     stomach?  . Stroke Father   . Stroke Brother     Social history:  reports that he quit smoking about 52 years ago. His smoking use included Cigarettes and Cigars. He has never used smokeless tobacco. He reports that he does not drink alcohol or use drugs.  Medications:  Prior to Admission medications   Medication Sig Start Date End Date Taking? Authorizing Provider  amLODipine (NORVASC) 2.5 MG tablet Take 3 tablets (7.5 mg total) by mouth daily. 02/08/16  Yes Lorretta Harp, MD  aspirin 81 MG tablet Take 81 mg by mouth daily.    Yes Historical Provider, MD  atorvastatin (LIPITOR) 40 MG tablet TAKE 1 TABLET AT BEDTIME 02/22/16  Yes Lorretta Harp, MD  cholecalciferol (VITAMIN D) 1000 units tablet Take 1,000 Units by mouth daily.   Yes Historical Provider, MD  enalapril (VASOTEC) 10 MG tablet Take 1 tablet (10 mg total) by mouth daily. 02/08/16  Yes Lorretta Harp, MD  hydrOXYzine (ATARAX/VISTARIL) 25 MG tablet Take 25 mg by mouth every evening.   Yes Historical Provider, MD  isosorbide mononitrate (IMDUR) 30 MG 24 hr tablet TAKE 3 TABLETS EVERY MORNING AND 1 TABLET EVERY EVENING 06/14/16  Yes Lorretta Harp, MD  loratadine (CLARITIN) 10 MG tablet Take 10 mg by mouth daily.   Yes Historical Provider, MD  Multiple Vitamins-Minerals (OCUVITE PO) Take 1 capsule by mouth 2 (two) times daily.    Yes Historical Provider, MD  omeprazole (PRILOSEC) 20 MG capsule TAKE 1 CAPSULE DAILY 01/14/16  Yes Lorretta Harp, MD  Probiotic Product (PROBIOTIC PO) Take by mouth daily.   Yes Historical Provider, MD  TOPROL XL 25 MG 24 hr tablet TAKE 1 TABLET TWICE A DAY WITH OR IMMEDIATELY FOLLOWING A MEAL 02/08/16  Yes Lorretta Harp, MD  amoxicillin (AMOXIL) 500 MG capsule Take 2,000 mg by mouth as needed. One hour before dental procedure    Historical Provider, MD  nitroGLYCERIN (NITROSTAT) 0.4 MG SL tablet Place 1 tablet (0.4 mg total) under the tongue as needed. 12/17/14   Lorretta Harp, MD      Allergies  Allergen Reactions  . Tape Hives  . Iohexol      Desc: PT DOES RECALL REACTION JUST SAYS IT WAS A LONG TIME AGO FOR KIDNEY X-RAYS AND HE DIDN'T TOLERATE IT WELL-ARS 05/02/09-NOTE E-CHART STATES A HOT FEELING IS HIS REACTION, BUT HE CAN'T CONFIRM THAT WAS ALL   . Oxycodone Other (See Comments)    Shaking uncontrollably     ROS:  Out of a complete 14 system review of symptoms, the patient complains only of the following symptoms, and all other reviewed systems are negative.  Easy bruising, easy bleeding Urinary incontinence Birthmarks Joint pain, achy muscles Memory loss, weakness Snoring  Blood pressure 124/72, pulse 75, height 5'  6.5" (1.689 m), weight 165 lb (74.8 kg).   Blood pressure, right arm, sitting is 128/72. Blood pressure, right arm, standing is 128/70.  Physical Exam  General: The patient is alert and cooperative at the time of the examination.  Eyes: Pupils are equal, round, and reactive to light. Discs are flat bilaterally.  Neck: The neck is supple, no carotid bruits are noted.  Respiratory: The respiratory examination is clear.  Cardiovascular: The cardiovascular examination reveals a regular rate and rhythm, no obvious murmurs or rubs are noted.  Neuromuscular: The patient is not able to abduct the left arm at  all, the patient is able to abduct the right arm about 30.  Skin: Extremities are without significant edema.  Neurologic Exam  Mental status: The patient is alert and oriented x 3 at the time of the examination. The patient has apparent normal recent and remote memory, with an apparently normal attention span and concentration ability.  Cranial nerves: Facial symmetry is present. There is good sensation of the face to pinprick and soft touch bilaterally. The strength of the facial muscles and the muscles to head turning and shoulder shrug are normal bilaterally. Speech is well enunciated, no aphasia or dysarthria is noted. Extraocular movements are full. Visual fields are full. The tongue is midline, and the patient has symmetric elevation of the soft palate. No obvious hearing deficits are noted.  Motor: The motor testing reveals 5 over 5 strength of all 4 extremities, with exception that the patient has difficulty with abduction of the arms bilaterally. Good symmetric motor tone is noted throughout.  Sensory: Sensory testing is intact to pinprick, soft touch, vibration sensation, and position sense on all 4 extremities. No evidence of extinction is noted.  Coordination: Cerebellar testing reveals good finger-nose-finger and heel-to-shin bilaterally.  Gait and station: Gait is normal.  Tandem gait is unsteady. Romberg is negative, but is unsteady. No drift is seen.  Reflexes: Deep tendon reflexes are symmetric and normal bilaterally. Toes are downgoing bilaterally.   Assessment/Plan:  1. Gait disturbance  The etiology of the walking problems is not clear. There is no evidence of parkinsonism or orthostatic hypotension. The patient has no weakness or evidence of a peripheral neuropathy. The patient will be sent for blood work today, MRI of the brain will be done. The patient be set up for physical therapy for gait training a he will follow-up in 4 or 5 months.  Jill Alexanders MD 09/07/2016 3:40 PM  [facility] [address] [place], [place] [address]  Phone [phone] Fax [phone]

## 2016-09-13 ENCOUNTER — Telehealth: Payer: Self-pay

## 2016-09-13 LAB — COPPER, SERUM: Copper: 151 ug/dL (ref 72–166)

## 2016-09-13 LAB — METHYLMALONIC ACID, SERUM: Methylmalonic Acid: 474 nmol/L — ABNORMAL HIGH (ref 0–378)

## 2016-09-13 LAB — SYPHILIS: RPR W/REFLEX TO RPR TITER AND TREPONEMAL ANTIBODIES, TRADITIONAL SCREENING AND DIAGNOSIS ALGORITHM: RPR Ser Ql: NONREACTIVE

## 2016-09-13 LAB — VITAMIN B12: Vitamin B-12: 453 pg/mL (ref 211–946)

## 2016-09-13 NOTE — Telephone Encounter (Signed)
-----   Message from Kathrynn Ducking, MD sent at 09/13/2016  3:04 PM EDT -----  Blood work is relatively unremarkable, the methylmalonic acid level is elevated, but vitamin B12 level is well within the normal range. No need for vitamin B12 supplementation. Please call the patient. ----- Message ----- From: Lavone Neri Lab Results In Sent: 09/08/2016   7:44 AM To: Kathrynn Ducking, MD

## 2016-09-13 NOTE — Telephone Encounter (Signed)
Called pt w/ lab results. Verbalized understanding and appreciation for call. 

## 2016-09-14 ENCOUNTER — Ambulatory Visit: Payer: Medicare Other | Admitting: Physical Therapy

## 2016-09-14 DIAGNOSIS — M12812 Other specific arthropathies, not elsewhere classified, left shoulder: Secondary | ICD-10-CM | POA: Diagnosis not present

## 2016-09-14 DIAGNOSIS — M75122 Complete rotator cuff tear or rupture of left shoulder, not specified as traumatic: Secondary | ICD-10-CM | POA: Diagnosis not present

## 2016-09-16 ENCOUNTER — Other Ambulatory Visit: Payer: Self-pay | Admitting: Orthopedic Surgery

## 2016-09-16 DIAGNOSIS — M25512 Pain in left shoulder: Secondary | ICD-10-CM

## 2016-09-26 DIAGNOSIS — I10 Essential (primary) hypertension: Secondary | ICD-10-CM | POA: Diagnosis not present

## 2016-09-26 DIAGNOSIS — Z23 Encounter for immunization: Secondary | ICD-10-CM | POA: Diagnosis not present

## 2016-09-26 DIAGNOSIS — E7800 Pure hypercholesterolemia, unspecified: Secondary | ICD-10-CM | POA: Diagnosis not present

## 2016-09-26 DIAGNOSIS — Z0000 Encounter for general adult medical examination without abnormal findings: Secondary | ICD-10-CM | POA: Diagnosis not present

## 2016-09-27 ENCOUNTER — Ambulatory Visit: Payer: Medicare Other | Attending: Family Medicine | Admitting: Physical Therapy

## 2016-09-27 ENCOUNTER — Encounter: Payer: Self-pay | Admitting: Physical Therapy

## 2016-09-27 DIAGNOSIS — R2681 Unsteadiness on feet: Secondary | ICD-10-CM | POA: Insufficient documentation

## 2016-09-27 DIAGNOSIS — R2689 Other abnormalities of gait and mobility: Secondary | ICD-10-CM | POA: Insufficient documentation

## 2016-09-27 DIAGNOSIS — H353232 Exudative age-related macular degeneration, bilateral, with inactive choroidal neovascularization: Secondary | ICD-10-CM | POA: Diagnosis not present

## 2016-09-27 DIAGNOSIS — H35351 Cystoid macular degeneration, right eye: Secondary | ICD-10-CM | POA: Diagnosis not present

## 2016-09-27 DIAGNOSIS — Z961 Presence of intraocular lens: Secondary | ICD-10-CM | POA: Diagnosis not present

## 2016-09-27 NOTE — Therapy (Signed)
[facility] [address] [place], [place], [address] Phone: [phone]   Fax:  [phone]  Physical Therapy Evaluation  Patient Details  Name: Donald Todd MRN: [id number] Date of Birth: October 17, 1929 Referring Provider: Dr. Floyde Parkins  Encounter Date: 09/27/2016      PT End of Session - 09/27/16 1613    Visit Number 1  G1 for Neuro   Number of Visits 9   Date for PT Re-Evaluation 10/28/16   Authorization Type Medicare/Tricare    Authorization Time Period 09-27-16 - 11-26-16   PT Start Time 0900  Pt 15" late for eval   PT Stop Time 0930   PT Time Calculation (min) 30 min      Past Medical History:  Diagnosis Date  . Arthritis   . CAD (coronary artery disease)    last cath. 07-09-2010  . Cancer [facility])    kidney cancer , prostate cancer   . Chronic kidney disease   . H/O cardiovascular stress test 08-04-2010   ef 55% NO ISCHEMIA  . H/O unilateral nephrectomy    lt. nephrectomy  . Heart attack 1993  . Hyperlipemia   . Hypertension    renal dopplers 218.13-normal patency  . Inguinal hernia 08/01/11   right  . Prostate disease    Cancer S/P seed implant    Past Surgical History:  Procedure Laterality Date  . ANGIOPLASTY  1993  . athroscopic knee surgery  2002   right  . bone scan  2005  . CARDIAC CATHETERIZATION  07-09-2010   normal left ventricular function, coronary obsstructive disease with 20% proximal an 30-40% mid lt. anterior descending narrowing ; widely patent stent in the proximal ramus intermediate vessel; 50-70-% narrowing in the mid circumflex,and widely patent stent  in the rt. coronary   . CARDIAC CATHETERIZATION  12-22-2003   widely patent stents with noncritical coronary artery disease and normalLV function  . CHOLECYSTECTOMY  05/05/2009   Laparoscopic  . colonscopy  2004   with biopsy  . CORONARY ANGIOPLASTY WITH STENT PLACEMENT  03-15-1996   pci to rci and ramus branch using j&j  [id number] stent,post dialated  at  20atm (rca)  and 14 atm (ramus branch. ef 55%            . HERNIA REPAIR  08/01/11   RIH  . INGUINAL HERNIA REPAIR Left 03/29/2013   Procedure: HERNIA REPAIR INGUINAL ADULT;  Surgeon: Edward Jolly, MD;  [facility];  Service: General;  Laterality: Left;  . INSERTION OF MESH Left 03/29/2013   Procedure: INSERTION OF MESH;  Surgeon: Edward Jolly, MD;  [facility];  Service: General;  Laterality: Left;  . JOINT REPLACEMENT  2006 and 2010   left 2006, right 2010  . KIDNEY SURGERY  2002 and 2005   2002 - partial removal of left. 2005 complete removal  . PROSTATE BIOPSY    . prostate seed implant    . radiation treatment  2002   prostate  . stent implants  1997    There were no vitals filed for this visit.       Subjective Assessment - 09/27/16 1539    Subjective Pt reports he fell off ladder in garage on 08-29-16 and then again on Wed., 08-31-16 off back step of patio; then fell around midnight over his son's dog, bruising R side of his cheek bone  Pt states he would like to return to [employer] to work with [employer] [place] as  he was doing prior to fall on 08-29-16                          Pertinent History CAD, OA, MI, HTN, HLD, hx prostate and kidney cancer; R shoulder dislocation with very limited motion   Limitations House hold activities   Diagnostic tests MRI on brain scheduled for Wed., 10-25 and MRI on L shoulder scheduled for Fri., 09-30-16   Patient Stated Goals Improve balance   Currently in Pain? No/denies            [facility] PT Assessment - 09/27/16 7989      Assessment   Medical Diagnosis Gait abnormality   Referring Provider Dr. Floyde Parkins   Onset Date/Surgical Date --  approx. 6 months ago   Prior Therapy pt was receiving PT at [facility] - had 2 sessions remaining     Precautions   Precautions Fall     Balance Screen   Has the patient fallen in the past 6 months Yes   How many times? 3   Has the patient had a  decrease in activity level because of a fear of falling?  No   Is the patient reluctant to leave their home because of a fear of falling?  No     Home Environment   Living Environment Private residence   Type of [place] to enter   Entrance Stairs-Number of Steps 1   Entrance Stairs-Rails None   Home Layout Other (Comment)  has bonus room - doesn't go up there often     Prior Function   Level of Independence Independent     Strength   Overall Strength Within functional limits for tasks performed   Strength Assessment Site Knee   Right/Left Knee Right;Left   Right Knee Flexion 4/5   Right Knee Extension 5/5   Left Knee Flexion 4/5   Left Knee Extension 5/5     Transfers   Transfers Sit to Stand   Number of Reps Other reps (comment)  1 rep   Comments no UE support noted     Ambulation/Gait   Ambulation/Gait Yes   Ambulation/Gait Assistance 6: Modified independent (Device/Increase time)   Ambulation Distance (Feet) 120 Feet   Assistive device None  No device used during evaluation   Gait Pattern Within Functional Limits   Ambulation Surface Level;Indoor   Gait velocity 12.00 secs = 2.73 ft/sec   Stairs Yes   Stairs Assistance 5: Supervision   Stair Management Technique Two rails;Alternating pattern   Number of Stairs 4   Height of Stairs 6   Gait Comments pt uses a cane for assistance with community ambulation  RLE is externally rotated in stance - may be due to R TKR      Standardized Balance Assessment   Standardized Balance Assessment Timed Up and Go Test     Timed Up and Go Test   Normal TUG (seconds) 13.56  no device                                PT Long Term Goals - 09/27/16 1635      Additional Long Term Goals   Additional Long Term Goals Yes     PT LONG TERM GOAL #7   Title Improve TUG score to </= 12.0 secs without device to reduce fall risk.  (10-28-16)  Baseline 13.56 secs with no device   Time 4    Period Weeks   Status New     PT LONG TERM GOAL #8   Title Increase gait velocity to >/= 3.2 ft/sec without device for incr. gait efficiency.  (10-28-16)   Baseline 2.73 ft/sec   Time 4   Period Weeks   Status New     PT LONG TERM GOAL  #9   TITLE Increase SLS to > 5.0 secs on R and LLE to demo improved balance.  (10-28-16)   Baseline LLE = 4.84 secs:  RLE = 4.53 secs   Time 4   Period Weeks   Status New     PT LONG TERM GOAL  #10   TITLE Amb. 200' with SPC on uneven terrain with S with pt demonstrating ability to recover LOB to demo improved balance.  (10-28-16)   Time 4   Period Weeks   Status New     PT LONG TERM GOAL  #11   TITLE Independent in HEP for balance/vestibular exercises.  (10-28-16)   Time 4   Period Weeks   Status New               Plan - 09/27/16 1615    Clinical Impression Statement Pt is an 82 year old gentleman s/p 3 falls on 08-29-16 resulting in injury to L shoulder.  Pt was previously receiving PT at [facility] to address L shoulder deficits and had 2 visits remaining prior to fall resulting in change in status.  Pt reports MRI of brain is scheduled on 09-28-16 and MRI L shoulder is scheduled for 09-30-16.  Pt presents with high level balance deficits and apparent vestibular deficits by increased sway noted with EC and by pt report of difficulty maintaining balance on grassy terrain and in shower.  PMH includes h/o MI, arthritis, s/p TKR's, angioplasty and cardiac catherization, h/o prostate cancer, HTN, and kidney disease.  Pt's status is evolving with moderate decision-making required in POC.                                                                                                                       Rehab Potential Good   PT Frequency 2x / week   PT Duration 4 weeks   PT Treatment/Interventions ADLs/Self Care Home Management;Therapeutic activities;Therapeutic exercise   PT Next Visit Plan begin HEP for balance; compliant surface training    PT Home Exercise Plan balance   Consulted and Agree with Plan of Care Patient      Patient will benefit from skilled therapeutic intervention in order to improve the following deficits and impairments:  Postural dysfunction, Decreased strength, Decreased range of motion, Impaired UE functional use, Decreased balance  Visit Diagnosis: Other abnormalities of gait and mobility - Plan: PT plan of care cert/re-cert  Unsteadiness on feet - Plan: PT plan of care cert/re-cert      G-Codes - 30/16/01 1608    Functional Assessment Tool Used TUG  13.56 secs with no device:  Gait velocity 2.73 ft/sec with no device:  pt reports unsteadiness with amb. on grassy terrain and in shower with eyes closed   Functional Limitation Mobility: Walking and moving around   Mobility: Walking and Moving Around Current Status [phone]) At least 20 percent but less than 40 percent impaired, limited or restricted   Mobility: Walking and Moving Around Goal Status [phone]) At least 1 percent but less than 20 percent impaired, limited or restricted       Problem List Patient Active Problem List   Diagnosis Date Noted  . Abnormality of gait 09/07/2016  . History of nephrectomy- Lt 06/16/2014  . Chronic renal impairment, stage 3 (moderate) 06/16/2014  . CAD-RCA/RI PCI in '97 05/15/2013  . Essential hypertension 05/15/2013  . Hyperlipidemia 05/15/2013    [place], PT 09/27/2016, 4:55 PM  [facility] [address] [place], [place], [address] Phone: [phone]   Fax:  [phone]  Name: BRITAIN SABER MRN: [id number] Date of Birth: [birth year]

## 2016-09-28 ENCOUNTER — Other Ambulatory Visit: Payer: TRICARE For Life (TFL)

## 2016-09-29 ENCOUNTER — Ambulatory Visit
Admission: RE | Admit: 2016-09-29 | Discharge: 2016-09-29 | Disposition: A | Discharge status: Home / Self Care | Payer: Medicare Other | Source: Ambulatory Visit | Attending: Neurology | Admitting: Neurology

## 2016-09-29 ENCOUNTER — Ambulatory Visit
Admission: RE | Admit: 2016-09-29 | Discharge: 2016-09-29 | Disposition: A | Discharge status: Home / Self Care | Payer: Medicare Other | Source: Ambulatory Visit | Attending: Orthopedic Surgery | Admitting: Orthopedic Surgery

## 2016-09-29 DIAGNOSIS — R260 Ataxic gait: Secondary | ICD-10-CM | POA: Diagnosis not present

## 2016-09-29 DIAGNOSIS — R269 Unspecified abnormalities of gait and mobility: Secondary | ICD-10-CM | POA: Diagnosis not present

## 2016-09-29 DIAGNOSIS — M25512 Pain in left shoulder: Secondary | ICD-10-CM

## 2016-09-30 ENCOUNTER — Telehealth: Payer: Self-pay | Admitting: Neurology

## 2016-09-30 DIAGNOSIS — M12812 Other specific arthropathies, not elsewhere classified, left shoulder: Secondary | ICD-10-CM | POA: Diagnosis not present

## 2016-09-30 NOTE — Telephone Encounter (Signed)
I called patient. The MRI the brain shows mild to moderate small vessel ischemic changes, nothing that should significantly impaired balance. The patient is 82 years old, he has been placed in physical therapy to see if this helps some of the balance issues.   MRI brain 09/30/16:  IMPRESSION:  Abnormal MRI scan of the brain showing age-related changes of chronic microvascular ischemia and generalized cerebral atrophy. Incidental mild changes of chronic paranasal sinusitis are noted as well.

## 2016-10-05 DIAGNOSIS — M75122 Complete rotator cuff tear or rupture of left shoulder, not specified as traumatic: Secondary | ICD-10-CM | POA: Diagnosis not present

## 2016-10-10 ENCOUNTER — Ambulatory Visit: Payer: Medicare Other | Attending: Orthopedic Surgery | Admitting: Physical Therapy

## 2016-10-10 DIAGNOSIS — M6281 Muscle weakness (generalized): Secondary | ICD-10-CM | POA: Diagnosis not present

## 2016-10-10 DIAGNOSIS — R293 Abnormal posture: Secondary | ICD-10-CM | POA: Insufficient documentation

## 2016-10-10 DIAGNOSIS — M25612 Stiffness of left shoulder, not elsewhere classified: Secondary | ICD-10-CM | POA: Insufficient documentation

## 2016-10-10 NOTE — Therapy (Signed)
[facility] [address]  [place], [place], [address] Phone: [phone]   Fax:  [phone]  Physical Therapy Evaluation  Patient Details  Name: Donald Todd MRN: [id number] Date of Birth: 16-Oct-1929 Referring Provider: Dr. Rod Can  Encounter Date: 10/10/2016      PT End of Session - 10/10/16 1514    Visit Number 2   Number of Visits 9   Date for PT Re-Evaluation 12/05/16   Authorization Type Medicare/Tricare    Authorization Time Period 09-27-16 - 11-26-16   PT Start Time 1440   PT Stop Time 1513   PT Time Calculation (min) 33 min   Activity Tolerance Patient tolerated treatment well   Behavior During Therapy [facility] for tasks assessed/performed      Past Medical History:  Diagnosis Date  . Arthritis   . CAD (coronary artery disease)    last cath. 07-09-2010  . Cancer [facility])    kidney cancer , prostate cancer   . Chronic kidney disease   . H/O cardiovascular stress test 08-04-2010   ef 55% NO ISCHEMIA  . H/O unilateral nephrectomy    lt. nephrectomy  . Heart attack 1993  . Hyperlipemia   . Hypertension    renal dopplers 218.13-normal patency  . Inguinal hernia 08/01/11   right  . Prostate disease    Cancer S/P seed implant    Past Surgical History:  Procedure Laterality Date  . ANGIOPLASTY  1993  . athroscopic knee surgery  2002   right  . bone scan  2005  . CARDIAC CATHETERIZATION  07-09-2010   normal left ventricular function, coronary obsstructive disease with 20% proximal an 30-40% mid lt. anterior descending narrowing ; widely patent stent in the proximal ramus intermediate vessel; 50-70-% narrowing in the mid circumflex,and widely patent stent  in the rt. coronary   . CARDIAC CATHETERIZATION  12-22-2003   widely patent stents with noncritical coronary artery disease and normalLV function  . CHOLECYSTECTOMY  05/05/2009   Laparoscopic  . colonscopy  2004   with biopsy  . CORONARY  ANGIOPLASTY WITH STENT PLACEMENT  03-15-1996   pci to rci and ramus branch using j&j [id number] stent,post dialated  at  20atm (rca)  and 14 atm (ramus branch. ef 55%            . HERNIA REPAIR  08/01/11   RIH  . INGUINAL HERNIA REPAIR Left 03/29/2013   Procedure: HERNIA REPAIR INGUINAL ADULT;  Surgeon: Edward Jolly, MD;  Location: [facility];  Service: General;  Laterality: Left;  . INSERTION OF MESH Left 03/29/2013   Procedure: INSERTION OF MESH;  Surgeon: Edward Jolly, MD;  Location: [facility];  Service: General;  Laterality: Left;  . JOINT REPLACEMENT  2006 and 2010   left 2006, right 2010  . KIDNEY SURGERY  2002 and 2005   2002 - partial removal of left. 2005 complete removal  . PROSTATE BIOPSY    . prostate seed implant    . radiation treatment  2002   prostate  . stent implants  1997    There were no vitals filed for this visit.       Subjective Assessment - 10/10/16 1439    Subjective Pt returns to [facility] for chronic L RTC tear.  Pt was seeing the PT prior to fall on 08/29/16 and PT subsequently d/c'ed due to change in medical status.  Pt evaluated by neuro PT for balance and fall prevention.  Pt to been seen here for both balance PT and L shoulder pain.   Pertinent History CAD, OA, MI, HTN, HLD, hx prostate and kidney cancer; R shoulder dislocation with very limited motion   Limitations House hold activities   Diagnostic tests MRI brain: cerebral atrophy / MRI shoulder: complete RTC tear   Patient Stated Goals Improve balance,    Currently in Pain? No/denies            [facility] PT Assessment - 10/10/16 1450      Assessment   Medical Diagnosis L RTC tear   Referring Provider Dr. Rod Can   Onset Date/Surgical Date 08/29/16   Hand Dominance Right   Prior Therapy seen 14 visits at this clinic for same condition     Precautions   Precautions Fall     Balance Screen   Has the patient fallen in the past 6 months Yes   How many times? 3   Has the patient had a  decrease in activity level because of a fear of falling?  No   Is the patient reluctant to leave their home because of a fear of falling?  No     Home Environment   Living Environment Private residence   Living Arrangements Spouse/significant other   Type of [place] to enter   Entrance Stairs-Number of Steps 1   Entrance Stairs-Rails None   [place] to live on main level with bedroom/bathroom   [place] - single point     Prior Function   Level of [place] Retired   Leisure travel     Observation/Other Assessments   Focus on Therapeutic Outcomes (FOTO)  43 (57% limited; predicted 37% limited)     Posture/Postural Control   Posture/Postural Control Postural limitations   Postural Limitations Rounded Shoulders;Forward head     AROM   Overall AROM Comments measured supine   Left Shoulder Flexion 45 Degrees   Left Shoulder ABduction 60 Degrees   Left Shoulder Internal Rotation 92 Degrees   Left Shoulder External Rotation 35 Degrees     PROM   PROM Assessment Site Shoulder   Right/Left Shoulder Left   Left Shoulder Flexion 135 Degrees   Left Shoulder ABduction 92 Degrees   Left Shoulder External Rotation 45 Degrees     Strength   Left Shoulder Flexion 3-/5   Left Shoulder ABduction 1/5   Left Shoulder Internal Rotation 3/5   Left Shoulder External Rotation 0/5                   [facility] Adult PT Treatment/Exercise - 10/10/16 1501      Shoulder Exercises: Supine   External Rotation AAROM;10 reps   External Rotation Limitations cane   Flexion AAROM;10 reps   Flexion Limitations cane     Shoulder Exercises: Seated   Abduction AAROM;Left;10 reps   ABduction Limitations cane     Shoulder Exercises: Standing   Flexion AAROM;Left;5 reps   Retraction Left;10 reps;Theraband   Theraband Level (Shoulder Retraction) Level 3 (Green)   Retraction Limitations 3 sec hold                 PT Education - 10/10/16 1514    Education provided Yes   Education Details reissued HEP from initial PT visit   Person(s) Educated Patient   Methods Explanation;Demonstration;Handout   Comprehension Verbalized understanding;Returned demonstration  PT Long Term Goals - 10/10/16 1519      PT LONG TERM GOAL #1   Title independent with HEP for L shoulder (12/05/16)   Time 4   Period Weeks   Status On-going     PT LONG TERM GOAL #2   Title improve AROM L shoulder to 75 degrees flexion, 90 degrees abduction for improved function (12/05/16)   Baseline n/a   Time 4   Period Weeks   Status New     PT LONG TERM GOAL #3   Title demonstrate ability to reach light objects (1-2#) overhead without increase in pain for improved function (12/05/16)   Time 4   Period Weeks   Status New     PT LONG TERM GOAL #4   Title n/a     PT LONG TERM GOAL #5   Title n/a     PT LONG TERM GOAL #6   Title n/a               Plan - 10/10/16 1515    Clinical Impression Statement Pt is an 82 y/o male who presents to [facility] with L shoulder 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  Pt's symptoms exacerbated after 3 falls in September.  Pt well known to this PT from PT prior to fall for L shoulder pain and decreased ROM and weakness.  Pt had demonstrated improvements in ROM and strength and overall function prior to fall.  Will plan to see PT 1x/wk for L shoulder and 1x/wk for balance.     Rehab Potential Good   PT Frequency 2x / week  1x/wk for balance; 1x/wk for shoulder   PT Duration 4 weeks   PT Treatment/Interventions ADLs/Self Care Home Management;Therapeutic activities;Therapeutic exercise;Cryotherapy;Electrical Stimulation;Neuromuscular re-education;Balance training;Functional mobility training;Patient/family education;Vasopneumatic Device;Manual techniques;Passive range of motion   PT Next Visit Plan begin HEP for balance; compliant surface training; L shoulder ROM as tolerated/try  isometrics   PT Home Exercise Plan balance   Consulted and Agree with Plan of Care Patient      Patient will benefit from skilled therapeutic intervention in order to improve the following deficits and impairments:  Postural dysfunction, Decreased strength, Decreased range of motion, Impaired UE functional use, Decreased balance  Visit Diagnosis: Stiffness of left shoulder, not elsewhere classified - Plan: PT plan of care cert/re-cert  Abnormal posture - Plan: PT plan of care cert/re-cert  Muscle weakness (generalized) - Plan: PT plan of care cert/re-cert      G-Codes - 40/98/11 02-05-1521    Functional Assessment Tool Used clinical judgement   Functional Limitation Self care   Self Care Current Status (B1478) At least 60 percent but less than 80 percent impaired, limited or restricted   Self Care Goal Status (G9562) At least 60 percent but less than 80 percent impaired, limited or restricted   Self Care Discharge Status [phone]) At least 60 percent but less than 80 percent impaired, limited or restricted       Problem List Patient Active Problem List   Diagnosis Date Noted  . Abnormality of gait 09/07/2016  . History of nephrectomy- Lt 06/16/2014  . Chronic renal impairment, stage 3 (moderate) 06/16/2014  . CAD-RCA/RI PCI in '97 05/15/2013  . Essential hypertension 05/15/2013  . Hyperlipidemia 05/15/2013       Laureen Abrahams, PT, DPT 10/10/16 3:24 PM    [facility] [address]  [place], [place], [address] Phone: [phone]  Fax:  [phone]  Name: JAMS TRICKETT MRN: [id number] Date of Birth: 1929/05/12

## 2016-10-14 ENCOUNTER — Ambulatory Visit: Payer: Medicare Other | Attending: Neurology | Admitting: Physical Therapy

## 2016-10-14 DIAGNOSIS — R2689 Other abnormalities of gait and mobility: Secondary | ICD-10-CM | POA: Diagnosis not present

## 2016-10-14 DIAGNOSIS — R2681 Unsteadiness on feet: Secondary | ICD-10-CM | POA: Diagnosis not present

## 2016-10-14 NOTE — Therapy (Signed)
[facility] [address]  [place], [place], [address] Phone: [phone]   Fax:  [phone]  Physical Therapy Treatment  Patient Details  Name: Donald Todd MRN: [id number] Date of Birth: March 13, 1929 Referring Provider: Dr Lanny Hurst Willis/Dr Aaron Edelman Swinteck  Encounter Date: 10/14/2016      PT End of Session - 10/14/16 1143    Visit Number 3   Number of Visits 9   Date for PT Re-Evaluation 12/05/16   Authorization Type Medicare/Tricare    Authorization Time Period 09-27-16 - 11-26-16   PT Start Time 1058   PT Stop Time 1142   PT Time Calculation (min) 44 min   Activity Tolerance Patient tolerated treatment well   Behavior During Therapy [facility] for tasks assessed/performed      Past Medical History:  Diagnosis Date  . Arthritis   . CAD (coronary artery disease)    last cath. 07-09-2010  . Cancer [facility])    kidney cancer , prostate cancer   . Chronic kidney disease   . H/O cardiovascular stress test 08-04-2010   ef 55% NO ISCHEMIA  . H/O unilateral nephrectomy    lt. nephrectomy  . Heart attack 1993  . Hyperlipemia   . Hypertension    renal dopplers 218.13-normal patency  . Inguinal hernia 08/01/11   right  . Prostate disease    Cancer S/P seed implant    Past Surgical History:  Procedure Laterality Date  . ANGIOPLASTY  1993  . athroscopic knee surgery  2002   right  . bone scan  2005  . CARDIAC CATHETERIZATION  07-09-2010   normal left ventricular function, coronary obsstructive disease with 20% proximal an 30-40% mid lt. anterior descending narrowing ; widely patent stent in the proximal ramus intermediate vessel; 50-70-% narrowing in the mid circumflex,and widely patent stent  in the rt. coronary   . CARDIAC CATHETERIZATION  12-22-2003   widely patent stents with noncritical coronary artery disease and normalLV function  . CHOLECYSTECTOMY  05/05/2009   Laparoscopic  . colonscopy  2004   with biopsy  .  CORONARY ANGIOPLASTY WITH STENT PLACEMENT  03-15-1996   pci to rci and ramus branch using j&j [id number] stent,post dialated  at  20atm (rca)  and 14 atm (ramus branch. ef 55%            . HERNIA REPAIR  08/01/11   RIH  . INGUINAL HERNIA REPAIR Left 03/29/2013   Procedure: HERNIA REPAIR INGUINAL ADULT;  Surgeon: Edward Jolly, MD;  [facility];  Service: General;  Laterality: Left;  . INSERTION OF MESH Left 03/29/2013   Procedure: INSERTION OF MESH;  Surgeon: Edward Jolly, MD;  [facility];  Service: General;  Laterality: Left;  . JOINT REPLACEMENT  2006 and 2010   left 2006, right 2010  . KIDNEY SURGERY  2002 and 2005   2002 - partial removal of left. 2005 complete removal  . PROSTATE BIOPSY    . prostate seed implant    . radiation treatment  2002   prostate  . stent implants  1997    There were no vitals filed for this visit.      Subjective Assessment - 10/14/16 1106    Subjective doing well, no pain or falls.   Patient Stated Goals Improve balance,    Currently in Pain? No/denies            [facility] PT Assessment - 10/14/16 0001  Assessment   Referring Provider Dr Lanny Hurst Willis/Dr Aaron Edelman Swinteck                     [facility] Adult PT Treatment/Exercise - 10/14/16 1109      Exercises   Exercises Knee/Hip     Knee/Hip Exercises: Aerobic   Nustep L5 x 8 min LE only             Balance Exercises - 10/14/16 1109      OTAGO PROGRAM   Head Movements Sitting;5 reps   Neck Movements Sitting;5 reps   Back Extension Standing;5 reps   Trunk Movements Standing;5 reps   Ankle Movements Sitting;10 reps   Knee Extensor 10 reps;Weight (comment)  2#   Knee Flexor 10 reps;Weight (comment)  2#   Hip ABductor 10 reps;Weight (comment)  2#   Ankle Plantorflexors 20 reps, support   Ankle Dorsiflexors 20 reps, support   Knee Bends 10 reps, no support   Backwards Walking Support   Walking and Turning Around No assistive device   Sideways  Walking No assistive device   Tandem Stance 10 seconds, support   Tandem Walk Support   One Leg Stand 10 seconds, support   Heel Walking Support   Toe Walk Support   Sit to Stand 10 reps, no support           PT Education - 10/14/16 1143    Education provided Yes   Education Details OTAGO balance program   Person(s) Educated Patient   Methods Explanation;Demonstration;Handout   Comprehension Verbalized understanding;Returned demonstration             PT Long Term Goals - 10/14/16 1143      PT LONG TERM GOAL #7   Title Improve TUG score to </= 12.0 secs without device to reduce fall risk.  (10-28-16)   Status On-going     PT LONG TERM GOAL #8   Title Increase gait velocity to >/= 3.2 ft/sec without device for incr. gait efficiency.  (10-28-16)   Status On-going     PT LONG TERM GOAL  #9   TITLE Increase SLS to > 5.0 secs on R and LLE to demo improved balance.  (10-28-16)   Status On-going     PT LONG TERM GOAL  #10   TITLE Amb. 200' with SPC on uneven terrain with S with pt demonstrating ability to recover LOB to demo improved balance.  (10-28-16)   Status On-going     PT LONG TERM GOAL  #11   TITLE Independent in HEP for balance/vestibular exercises.  (10-28-16)   Status On-going               Plan - 10/14/16 1143    Clinical Impression Statement Issued OTAGO balance program today and pt performed without difficulty.  Will continue to benefit from PT to maximize function and decrease fall risk.   Rehab Potential Good   PT Frequency 2x / week   PT Duration 4 weeks   PT Treatment/Interventions ADLs/Self Care Home Management;Therapeutic activities;Therapeutic exercise;Cryotherapy;Electrical Stimulation;Neuromuscular re-education;Balance training;Functional mobility training;Patient/family education;Vasopneumatic Device;Manual techniques;Passive range of motion   PT Next Visit Plan begin HEP for balance; compliant surface training; L shoulder ROM as  tolerated/try isometrics   Consulted and Agree with Plan of Care Patient      Patient will benefit from skilled therapeutic intervention in order to improve the following deficits and impairments:  Postural dysfunction, Decreased strength, Decreased range of motion, Impaired UE functional use,  Decreased balance  Visit Diagnosis: Other abnormalities of gait and mobility  Unsteadiness on feet     Problem List Patient Active Problem List   Diagnosis Date Noted  . Abnormality of gait 09/07/2016  . History of nephrectomy- Lt 06/16/2014  . Chronic renal impairment, stage 3 (moderate) 06/16/2014  . CAD-RCA/RI PCI in '97 05/15/2013  . Essential hypertension 05/15/2013  . Hyperlipidemia 05/15/2013       Laureen Abrahams, PT, DPT 10/14/16 11:46 AM    [facility] [address]  [place], [place], [address] Phone: [phone]   Fax:  [phone]  Name: Donald Todd MRN: [id number] Date of Birth: 01/04/1929

## 2016-10-18 ENCOUNTER — Ambulatory Visit: Payer: Medicare Other | Admitting: Physical Therapy

## 2016-10-18 DIAGNOSIS — R293 Abnormal posture: Secondary | ICD-10-CM | POA: Diagnosis not present

## 2016-10-18 DIAGNOSIS — M6281 Muscle weakness (generalized): Secondary | ICD-10-CM

## 2016-10-18 DIAGNOSIS — M25612 Stiffness of left shoulder, not elsewhere classified: Secondary | ICD-10-CM

## 2016-10-18 NOTE — Therapy (Signed)
[facility] [address]  [place], [place], [address] Phone: [phone]   Fax:  [phone]  Physical Therapy Treatment  Patient Details  Name: Donald Todd MRN: [id number] Date of Birth: 02/03/1929 Referring Provider: Dr Lanny Hurst Willis/Dr  Edelman Swinteck  Encounter Date: 10/18/2016      PT End of Session - 10/18/16 1210    Visit Number 4   Number of Visits 9   Date for PT Re-Evaluation 12/05/16   Authorization Type Medicare/Tricare    Authorization Time Period 09-27-16 - 11-26-16   PT Start Time 1017   PT Stop Time 1059   PT Time Calculation (min) 42 min   Activity Tolerance Patient tolerated treatment well   Behavior During Therapy [facility] for tasks assessed/performed      Past Medical History:  Diagnosis Date  . Arthritis   . CAD (coronary artery disease)    last cath. 07-09-2010  . Cancer [facility])    kidney cancer , prostate cancer   . Chronic kidney disease   . H/O cardiovascular stress test 08-04-2010   ef 55% NO ISCHEMIA  . H/O unilateral nephrectomy    lt. nephrectomy  . Heart attack 1993  . Hyperlipemia   . Hypertension    renal dopplers 218.13-normal patency  . Inguinal hernia 08/01/11   right  . Prostate disease    Cancer S/P seed implant    Past Surgical History:  Procedure Laterality Date  . ANGIOPLASTY  1993  . athroscopic knee surgery  2002   right  . bone scan  2005  . CARDIAC CATHETERIZATION  07-09-2010   normal left ventricular function, coronary obsstructive disease with 20% proximal an 30-40% mid lt. anterior descending narrowing ; widely patent stent in the proximal ramus intermediate vessel; 50-70-% narrowing in the mid circumflex,and widely patent stent  in the rt. coronary   . CARDIAC CATHETERIZATION  12-22-2003   widely patent stents with noncritical coronary artery disease and normalLV function  . CHOLECYSTECTOMY  05/05/2009   Laparoscopic  . colonscopy  2004   with biopsy  .  CORONARY ANGIOPLASTY WITH STENT PLACEMENT  03-15-1996   pci to rci and ramus branch using j&j [id number] stent,post dialated  at  20atm (rca)  and 14 atm (ramus branch. ef 55%            . HERNIA REPAIR  08/01/11   RIH  . INGUINAL HERNIA REPAIR Left 03/29/2013   Procedure: HERNIA REPAIR INGUINAL ADULT;  Surgeon: Edward Jolly, MD;  [facility];  Service: General;  Laterality: Left;  . INSERTION OF MESH Left 03/29/2013   Procedure: INSERTION OF MESH;  Surgeon: Edward Jolly, MD;  [facility];  Service: General;  Laterality: Left;  . JOINT REPLACEMENT  2006 and 2010   left 2006, right 2010  . KIDNEY SURGERY  2002 and 2005   2002 - partial removal of left. 2005 complete removal  . PROSTATE BIOPSY    . prostate seed implant    . radiation treatment  2002   prostate  . stent implants  1997    There were no vitals filed for this visit.      Subjective Assessment - 10/18/16 1020    Subjective no pain, has been doing HEP - no complaints   Pertinent History CAD, OA, MI, HTN, HLD, hx prostate and kidney cancer; R shoulder dislocation with very limited motion   Limitations House hold activities   Diagnostic tests  MRI brain: cerebral atrophy / MRI shoulder: complete RTC tear   Patient Stated Goals Improve balance,    Currently in Pain? No/denies                         [facility] Adult PT Treatment/Exercise - 10/18/16 1022      Exercises   Exercises Elbow     Elbow Exercises   Elbow Flexion Strengthening;Left;15 reps;Bar weights/barbell   Bar Weights/Barbell (Elbow Flexion) 3 lbs   Forearm Supination Left;15 reps;Bar weights/barbell   Forearm Supination Limitations 3#   Forearm Pronation Left;15 reps;Bar weights/barbell   Forearm Pronation Limitations 3#     Shoulder Exercises: Supine   External Rotation AAROM;10 reps   External Rotation Limitations cane   Flexion AAROM;10 reps   Flexion Limitations cane     Shoulder Exercises: Standing   Internal  Rotation AROM;Strengthening;Left;15 reps;Theraband   Theraband Level (Shoulder Internal Rotation) Level 2 (Red)   Flexion AAROM;Left;10 reps   Flexion Limitations wall ladder   ABduction AAROM;Left;10 reps   ABduction Limitations wall ladder   Other Standing Exercises row with green tband x 15     Shoulder Exercises: Pulleys   Flexion 3 minutes   ABduction 3 minutes     Shoulder Exercises: ROM/Strengthening   UBE (Upper Arm Bike) Level 3 x 6' (3' fwd/ 3' bwd)                     PT Long Term Goals - 10/14/16 1143      PT LONG TERM GOAL #7   Title Improve TUG score to </= 12.0 secs without device to reduce fall risk.  (10-28-16)   Status On-going     PT LONG TERM GOAL #8   Title Increase gait velocity to >/= 3.2 ft/sec without device for incr. gait efficiency.  (10-28-16)   Status On-going     PT LONG TERM GOAL  #9   TITLE Increase SLS to > 5.0 secs on R and LLE to demo improved balance.  (10-28-16)   Status On-going     PT LONG TERM GOAL  #10   TITLE Amb. 200' with SPC on uneven terrain with S with pt demonstrating ability to recover LOB to demo improved balance.  (10-28-16)   Status On-going     PT LONG TERM GOAL  #11   TITLE Independent in HEP for balance/vestibular exercises.  (10-28-16)   Status On-going               Plan - 10/18/16 1212    Clinical Impression Statement Patient with demonstrated tightness into flexion, abduction and ER of L shoulder, with little to no active movement into ER. PT session focusing on maintianing ROM at L shoulder as well as improving scapular stabilization strength where it is available. Patient to conitnue to benefit from skilled PT to improve functional use of UE.    PT Treatment/Interventions ADLs/Self Care Home Management;Therapeutic activities;Therapeutic exercise;Cryotherapy;Electrical Stimulation;Neuromuscular re-education;Balance training;Functional mobility training;Patient/family education;Vasopneumatic  Device;Manual techniques;Passive range of motion   PT Next Visit Plan begin HEP for balance; compliant surface training; L shoulder ROM as tolerated/try isometrics   Consulted and Agree with Plan of Care Patient      Patient will benefit from skilled therapeutic intervention in order to improve the following deficits and impairments:  Postural dysfunction, Decreased strength, Decreased range of motion, Impaired UE functional use, Decreased balance  Visit Diagnosis: Stiffness of left shoulder, not elsewhere classified  Abnormal posture  Muscle weakness (generalized)     Problem List Patient Active Problem List   Diagnosis Date Noted  . Abnormality of gait 09/07/2016  . History of nephrectomy- Lt 06/16/2014  . Chronic renal impairment, stage 3 (moderate) 06/16/2014  . CAD-RCA/RI PCI in '97 05/15/2013  . Essential hypertension 05/15/2013  . Hyperlipidemia 05/15/2013       Lanney Gins, PT, DPT 10/18/16 12:15 PM     [facility] [address]  [place], [place], [address] Phone: [phone]   Fax:  [phone]  Name: Donald Todd MRN: [id number] Date of Birth: 1929/07/17

## 2016-10-19 DIAGNOSIS — Z08 Encounter for follow-up examination after completed treatment for malignant neoplasm: Secondary | ICD-10-CM | POA: Diagnosis not present

## 2016-10-19 DIAGNOSIS — Z85828 Personal history of other malignant neoplasm of skin: Secondary | ICD-10-CM | POA: Diagnosis not present

## 2016-10-19 DIAGNOSIS — L2084 Intrinsic (allergic) eczema: Secondary | ICD-10-CM | POA: Diagnosis not present

## 2016-10-21 ENCOUNTER — Ambulatory Visit: Payer: Medicare Other | Admitting: Physical Therapy

## 2016-10-21 DIAGNOSIS — R2681 Unsteadiness on feet: Secondary | ICD-10-CM | POA: Diagnosis not present

## 2016-10-21 DIAGNOSIS — R2689 Other abnormalities of gait and mobility: Secondary | ICD-10-CM

## 2016-10-21 NOTE — Patient Instructions (Addendum)
  STAND IN A CORNER WITH A CHAIR IN FRONT OF YOU FOR SAFETY.  Feet Apart (Compliant Surface) Varied Arm Positions - Eyes Closed    Stand on compliant surface: __pillow or foam cushion___ with feet shoulder width apart and arms at your side. Close eyes and stand still. Hold__10-15__ seconds. Repeat __5__ times per session. Do __1-2__ sessions per day.  Copyright  VHI. All rights reserved.     Feet Together (Compliant Surface) Head Motion - Eyes Open    With eyes open, standing on compliant surface: _pillow or foam cushion___, feet together, move head slowly: up and down 10 times and side to side 10 times. Repeat _10___ times per session. Do _1-2___ sessions per day.  Copyright  VHI. All rights reserved.

## 2016-10-21 NOTE — Therapy (Signed)
[facility] [address]  [place], [place], [address] Phone: [phone]   Fax:  [phone]  Physical Therapy Treatment  Patient Details  Name: Donald Todd MRN: [id number] Date of Birth: 06-Apr-1929 Referring Provider: Dr Lanny Hurst Willis/Dr Aaron Edelman Swinteck  Encounter Date: 10/21/2016      PT End of Session - 10/21/16 1106    Visit Number 5   Number of Visits 9   Date for PT Re-Evaluation 12/05/16   Authorization Type Medicare/Tricare    Authorization Time Period 09-27-16 - 11-26-16   PT Start Time 1020   PT Stop Time 1100   PT Time Calculation (min) 40 min   Activity Tolerance Patient tolerated treatment well   Behavior During Therapy [facility] for tasks assessed/performed      Past Medical History:  Diagnosis Date  . Arthritis   . CAD (coronary artery disease)    last cath. 07-09-2010  . Cancer [facility])    kidney cancer , prostate cancer   . Chronic kidney disease   . H/O cardiovascular stress test 08-04-2010   ef 55% NO ISCHEMIA  . H/O unilateral nephrectomy    lt. nephrectomy  . Heart attack 1993  . Hyperlipemia   . Hypertension    renal dopplers 218.13-normal patency  . Inguinal hernia 08/01/11   right  . Prostate disease    Cancer S/P seed implant    Past Surgical History:  Procedure Laterality Date  . ANGIOPLASTY  1993  . athroscopic knee surgery  2002   right  . bone scan  2005  . CARDIAC CATHETERIZATION  07-09-2010   normal left ventricular function, coronary obsstructive disease with 20% proximal an 30-40% mid lt. anterior descending narrowing ; widely patent stent in the proximal ramus intermediate vessel; 50-70-% narrowing in the mid circumflex,and widely patent stent  in the rt. coronary   . CARDIAC CATHETERIZATION  12-22-2003   widely patent stents with noncritical coronary artery disease and normalLV function  . CHOLECYSTECTOMY  05/05/2009   Laparoscopic  . colonscopy  2004   with biopsy  .  CORONARY ANGIOPLASTY WITH STENT PLACEMENT  03-15-1996   pci to rci and ramus branch using j&j [id number] stent,post dialated  at  20atm (rca)  and 14 atm (ramus branch. ef 55%            . HERNIA REPAIR  08/01/11   RIH  . INGUINAL HERNIA REPAIR Left 03/29/2013   Procedure: HERNIA REPAIR INGUINAL ADULT;  Surgeon: Edward Jolly, MD;  Location: [facility];  Service: General;  Laterality: Left;  . INSERTION OF MESH Left 03/29/2013   Procedure: INSERTION OF MESH;  Surgeon: Edward Jolly, MD;  Location: [facility];  Service: General;  Laterality: Left;  . JOINT REPLACEMENT  2006 and 2010   left 2006, right 2010  . KIDNEY SURGERY  2002 and 2005   2002 - partial removal of left. 2005 complete removal  . PROSTATE BIOPSY    . prostate seed implant    . radiation treatment  2002   prostate  . stent implants  1997    There were no vitals filed for this visit.      Subjective Assessment - 10/21/16 1021    Subjective no falls; balance exercises are going well   Pertinent History CAD, OA, MI, HTN, HLD, hx prostate and kidney cancer; R shoulder dislocation with very limited motion   Limitations House hold activities   Diagnostic tests MRI brain:  cerebral atrophy / MRI shoulder: complete RTC tear   Patient Stated Goals Improve balance,    Currently in Pain? Yes   Pain Score 3    Pain Location Shoulder   Pain Orientation Left   Pain Descriptors / Indicators Aching   Pain Type Chronic pain   Aggravating Factors  lifting, use   Pain Relieving Factors rest                         [facility] Adult PT Treatment/Exercise - 10/21/16 1022      Ambulation/Gait   Gait Comments amb 350' with head turns to visual targets with min A; LOB x 1 needing min A to correct     Knee/Hip Exercises: Aerobic   Nustep L6 x 8 min LE only     Knee/Hip Exercises: Seated   Sit to Sand 10 reps;without UE support  on compliant surface             Balance Exercises - 10/21/16 1035      Balance  Exercises: Standing   Standing Eyes Opened Foam/compliant surface;Narrow base of support (BOS);Head turns   Standing Eyes Closed Foam/compliant surface;Wide (BOA);5 reps;10 secs   Other Standing Exercises single tap/double tap/kicking over and uprighting 8" cones with min A for SLS           PT Education - 10/21/16 1105    Education provided Yes   Education Details corner balance HEP   Person(s) Educated Patient   Methods Explanation;Demonstration;Handout   Comprehension Verbalized understanding;Returned demonstration             PT Long Term Goals - 10/14/16 1143      PT LONG TERM GOAL #7   Title Improve TUG score to </= 12.0 secs without device to reduce fall risk.  (10-28-16)   Status On-going     PT LONG TERM GOAL #8   Title Increase gait velocity to >/= 3.2 ft/sec without device for incr. gait efficiency.  (10-28-16)   Status On-going     PT LONG TERM GOAL  #9   TITLE Increase SLS to > 5.0 secs on R and LLE to demo improved balance.  (10-28-16)   Status On-going     PT LONG TERM GOAL  #10   TITLE Amb. 200' with SPC on uneven terrain with S with pt demonstrating ability to recover LOB to demo improved balance.  (10-28-16)   Status On-going     PT LONG TERM GOAL  #11   TITLE Independent in HEP for balance/vestibular exercises.  (10-28-16)   Status On-going               Plan - 10/21/16 1106    Clinical Impression Statement Pt with LOB on compliant surface with feet together and eyes open, as well as increased difficulty with compliant surface and EC indicating likely decreased vestibular input.  Will continue to benefit from PT to maximize function.   PT Treatment/Interventions ADLs/Self Care Home Management;Therapeutic activities;Therapeutic exercise;Cryotherapy;Electrical Stimulation;Neuromuscular re-education;Balance training;Functional mobility training;Patient/family education;Vasopneumatic Device;Manual techniques;Passive range of motion   PT Next  Visit Plan balance: compliant surface training; shoulder: L shoulder ROM as tolerated/try isometrics      Patient will benefit from skilled therapeutic intervention in order to improve the following deficits and impairments:  Postural dysfunction, Decreased strength, Decreased range of motion, Impaired UE functional use, Decreased balance  Visit Diagnosis: Other abnormalities of gait and mobility  Unsteadiness on feet  Problem List Patient Active Problem List   Diagnosis Date Noted  . Abnormality of gait 09/07/2016  . History of nephrectomy- Lt 06/16/2014  . Chronic renal impairment, stage 3 (moderate) 06/16/2014  . CAD-RCA/RI PCI in '97 05/15/2013  . Essential hypertension 05/15/2013  . Hyperlipidemia 05/15/2013       Laureen Abrahams, PT, DPT 10/21/16 11:15 AM    [facility] [address]  [place], [place], [address] Phone: [phone]   Fax:  [phone]  Name: Donald Todd MRN: [id number] Date of Birth: 08-03-1929

## 2016-10-24 ENCOUNTER — Ambulatory Visit: Payer: Medicare Other | Admitting: Physical Therapy

## 2016-10-24 DIAGNOSIS — M6281 Muscle weakness (generalized): Secondary | ICD-10-CM

## 2016-10-24 DIAGNOSIS — M25612 Stiffness of left shoulder, not elsewhere classified: Secondary | ICD-10-CM

## 2016-10-24 DIAGNOSIS — R293 Abnormal posture: Secondary | ICD-10-CM | POA: Diagnosis not present

## 2016-10-24 NOTE — Therapy (Signed)
[facility] [address]  [place], [place], [address] Phone: [phone]   Fax:  [phone]  Physical Therapy Treatment  Patient Details  Name: Donald Todd MRN: [id number] Date of Birth: 1929-03-22 Referring Provider: Dr Lanny Hurst Willis/Dr  Edelman Swinteck  Encounter Date: 10/24/2016      PT End of Session - 10/24/16 1031    Visit Number 6   Number of Visits 9   Date for PT Re-Evaluation 12/05/16   Authorization Type Medicare/Tricare    Authorization Time Period 09-27-16 - 11-26-16   PT Start Time 1016   PT Stop Time 1050  limited ability to progress L shoulder   PT Time Calculation (min) 34 min   Activity Tolerance Patient tolerated treatment well   Behavior During Therapy [facility] for tasks assessed/performed      Past Medical History:  Diagnosis Date  . Arthritis   . CAD (coronary artery disease)    last cath. 07-09-2010  . Cancer [facility])    kidney cancer , prostate cancer   . Chronic kidney disease   . H/O cardiovascular stress test 08-04-2010   ef 55% NO ISCHEMIA  . H/O unilateral nephrectomy    lt. nephrectomy  . Heart attack 1993  . Hyperlipemia   . Hypertension    renal dopplers 218.13-normal patency  . Inguinal hernia 08/01/11   right  . Prostate disease    Cancer S/P seed implant    Past Surgical History:  Procedure Laterality Date  . ANGIOPLASTY  1993  . athroscopic knee surgery  2002   right  . bone scan  2005  . CARDIAC CATHETERIZATION  07-09-2010   normal left ventricular function, coronary obsstructive disease with 20% proximal an 30-40% mid lt. anterior descending narrowing ; widely patent stent in the proximal ramus intermediate vessel; 50-70-% narrowing in the mid circumflex,and widely patent stent  in the rt. coronary   . CARDIAC CATHETERIZATION  12-22-2003   widely patent stents with noncritical coronary artery disease and normalLV function  . CHOLECYSTECTOMY  05/05/2009   Laparoscopic   . colonscopy  2004   with biopsy  . CORONARY ANGIOPLASTY WITH STENT PLACEMENT  03-15-1996   pci to rci and ramus branch using j&j [id number] stent,post dialated  at  20atm (rca)  and 14 atm (ramus branch. ef 55%            . HERNIA REPAIR  08/01/11   RIH  . INGUINAL HERNIA REPAIR Left 03/29/2013   Procedure: HERNIA REPAIR INGUINAL ADULT;  Surgeon: Edward Jolly, MD;  [facility];  Service: General;  Laterality: Left;  . INSERTION OF MESH Left 03/29/2013   Procedure: INSERTION OF MESH;  Surgeon: Edward Jolly, MD;  [facility]: [facility];  Service: General;  Laterality: Left;  . JOINT REPLACEMENT  2006 and 2010   left 2006, right 2010  . KIDNEY SURGERY  2002 and 2005   2002 - partial removal of left. 2005 complete removal  . PROSTATE BIOPSY    . prostate seed implant    . radiation treatment  2002   prostate  . stent implants  1997    There were no vitals filed for this visit.      Subjective Assessment - 10/24/16 1019    Subjective Patient reporting some L shoulder soreness after performing shoulder exercises at home - this is abnormal; had an episode of lightheadedness with OTAGO balance HEP yesterday.    Pertinent History CAD, OA,  MI, HTN, HLD, hx prostate and kidney cancer; R shoulder dislocation with very limited motion   Diagnostic tests MRI brain: cerebral atrophy / MRI shoulder: complete RTC tear   Patient Stated Goals Improve balance,    Currently in Pain? Yes   Pain Score 2    Pain Location Shoulder   Pain Orientation Left   Pain Descriptors / Indicators Aching   Pain Type Chronic pain   Aggravating Factors  lifting, use   Pain Relieving Factors rest                         [facility] Adult PT Treatment/Exercise - 10/24/16 1023      Exercises   Exercises Shoulder     Shoulder Exercises: Seated   Elevation AAROM;Left;5 reps   Elevation Limitations little forward elevation with AAROM from R UE   Flexion Left;15 reps   Flexion Limitations  eccentric control - requires AA for both elevation and lowering     Shoulder Exercises: Standing   Internal Rotation Strengthening;Left;10 reps;Theraband  2 sets   Theraband Level (Shoulder Internal Rotation) Level 3 (Green)   Flexion AAROM;Left;10 reps   Flexion Limitations wall ladder with hold at top   Extension Both;10 reps;Theraband   Theraband Level (Shoulder Extension) Level 3 (Green)  with scap squeeze (5 second hold) x 2 sets   Retraction Both;10 reps;Theraband   Theraband Level (Shoulder Retraction) Level 3 (Green)   Retraction Limitations 5 second hold x 2 sets     Shoulder Exercises: Pulleys   Flexion 3 minutes   ABduction 3 minutes     Shoulder Exercises: ROM/Strengthening   UBE (Upper Arm Bike) level 3 x 8' (4 forward/ 4 backward)                     PT Long Term Goals - 10/24/16 1032      PT LONG TERM GOAL #1   Title independent with HEP for L shoulder (12/05/16)   Time 4   Period Weeks   Status On-going     PT LONG TERM GOAL #2   Title improve AROM L shoulder to 75 degrees flexion, 90 degrees abduction for improved function (12/05/16)   Baseline n/a   Time 4   Period Weeks   Status On-going     PT LONG TERM GOAL #3   Title demonstrate ability to reach light objects (1-2#) overhead without increase in pain for improved function (12/05/16)   Time 4   Period Weeks   Status On-going     PT LONG TERM GOAL #4   Title n/a     PT LONG TERM GOAL #5   Title n/a               Plan - 10/24/16 1053    Clinical Impression Statement Patient today with subjective reports of soreness following HEP for shoulder. Patient with limited ability to perform AAROM of L shoulder with assist frmm R due to impaired R UE function/ROM. Patient to continue to benefit from therapy to progress functional use of L UE.    PT Treatment/Interventions ADLs/Self Care Home Management;Therapeutic activities;Therapeutic exercise;Cryotherapy;Electrical  Stimulation;Neuromuscular re-education;Balance training;Functional mobility training;Patient/family education;Vasopneumatic Device;Manual techniques;Passive range of motion   PT Next Visit Plan balance: compliant surface training; shoulder: L shoulder ROM as tolerated/try isometrics   Consulted and Agree with Plan of Care Patient      Patient will benefit from skilled therapeutic intervention in order to improve  the following deficits and impairments:  Postural dysfunction, Decreased strength, Decreased range of motion, Impaired UE functional use, Decreased balance  Visit Diagnosis: Stiffness of left shoulder, not elsewhere classified  Abnormal posture  Muscle weakness (generalized)     Problem List Patient Active Problem List   Diagnosis Date Noted  . Abnormality of gait 09/07/2016  . History of nephrectomy- Lt 06/16/2014  . Chronic renal impairment, stage 3 (moderate) 06/16/2014  . CAD-RCA/RI PCI in '97 05/15/2013  . Essential hypertension 05/15/2013  . Hyperlipidemia 05/15/2013       Lanney Gins, PT, DPT 10/24/16 11:03 AM    [facility] [address]  [place], [place], [address] Phone: [phone]   Fax:  [phone]  Name: Donald Todd MRN: [id number] Date of Birth: July 07, 1929

## 2016-10-26 ENCOUNTER — Ambulatory Visit: Payer: Medicare Other | Admitting: Physical Therapy

## 2016-10-26 DIAGNOSIS — R2689 Other abnormalities of gait and mobility: Secondary | ICD-10-CM | POA: Diagnosis not present

## 2016-10-26 DIAGNOSIS — R2681 Unsteadiness on feet: Secondary | ICD-10-CM

## 2016-10-26 NOTE — Therapy (Signed)
[facility] [address]  [place], [place], [address] Phone: [phone]   Fax:  [phone]  Physical Therapy Treatment  Patient Details  Name: Donald Todd MRN: [id number] Date of Birth: 1929-08-07 Referring Provider: Dr Lanny Hurst Willis/Dr Aaron Edelman Swinteck  Encounter Date: 10/26/2016      PT End of Session - 10/26/16 1058    Visit Number 7   Number of Visits 9   Date for PT Re-Evaluation 12/05/16   Authorization Type Medicare/Tricare    Authorization Time Period 09-27-16 - 11-26-16   PT Start Time 1016   PT Stop Time 1057   PT Time Calculation (min) 41 min   Activity Tolerance Patient tolerated treatment well   Behavior During Therapy [facility] for tasks assessed/performed      Past Medical History:  Diagnosis Date  . Arthritis   . CAD (coronary artery disease)    last cath. 07-09-2010  . Cancer [facility])    kidney cancer , prostate cancer   . Chronic kidney disease   . H/O cardiovascular stress test 08-04-2010   ef 55% NO ISCHEMIA  . H/O unilateral nephrectomy    lt. nephrectomy  . Heart attack 1993  . Hyperlipemia   . Hypertension    renal dopplers 218.13-normal patency  . Inguinal hernia 08/01/11   right  . Prostate disease    Cancer S/P seed implant    Past Surgical History:  Procedure Laterality Date  . ANGIOPLASTY  1993  . athroscopic knee surgery  2002   right  . bone scan  2005  . CARDIAC CATHETERIZATION  07-09-2010   normal left ventricular function, coronary obsstructive disease with 20% proximal an 30-40% mid lt. anterior descending narrowing ; widely patent stent in the proximal ramus intermediate vessel; 50-70-% narrowing in the mid circumflex,and widely patent stent  in the rt. coronary   . CARDIAC CATHETERIZATION  12-22-2003   widely patent stents with noncritical coronary artery disease and normalLV function  . CHOLECYSTECTOMY  05/05/2009   Laparoscopic  . colonscopy  2004   with biopsy  .  CORONARY ANGIOPLASTY WITH STENT PLACEMENT  03-15-1996   pci to rci and ramus branch using j&j [id number] stent,post dialated  at  20atm (rca)  and 14 atm (ramus branch. ef 55%            . HERNIA REPAIR  08/01/11   RIH  . INGUINAL HERNIA REPAIR Left 03/29/2013   Procedure: HERNIA REPAIR INGUINAL ADULT;  Surgeon: Edward Jolly, MD;  [facility];  Service: General;  Laterality: Left;  . INSERTION OF MESH Left 03/29/2013   Procedure: INSERTION OF MESH;  Surgeon: Edward Jolly, MD;  [facility]: [facility];  Service: General;  Laterality: Left;  . JOINT REPLACEMENT  2006 and 2010   left 2006, right 2010  . KIDNEY SURGERY  2002 and 2005   2002 - partial removal of left. 2005 complete removal  . PROSTATE BIOPSY    . prostate seed implant    . radiation treatment  2002   prostate  . stent implants  1997    There were no vitals filed for this visit.      Subjective Assessment - 10/26/16 1018    Subjective No falls, compliant with balance tasks at home.    Pertinent History CAD, OA, MI, HTN, HLD, hx prostate and kidney cancer; R shoulder dislocation with very limited motion   Diagnostic tests MRI brain: cerebral atrophy / MRI  shoulder: complete RTC tear   Patient Stated Goals Improve balance,    Currently in Pain? No/denies                         [facility] Adult PT Treatment/Exercise - 10/26/16 1057      Ambulation/Gait   Gait Comments amb 400' with vertical and horizontal head turns     Knee/Hip Exercises: Aerobic   Nustep L 6 x 8 minutes              Balance Exercises - 10/26/16 1019      Balance Exercises: Standing   Standing Eyes Opened Narrow base of support (BOS);Head turns;Foam/compliant surface  2 reps R to L head turns; 2 reps up/down   Standing Eyes Closed Foam/compliant surface;Wide (BOA);5 reps;10 secs   Step Ups Forward;4 inch  onto areobic step with blue AirEx x 10 each LE   Sit to Stand Time sit to stand from low mat table x 10; standing on  blue AirEx   Other Standing Exercises single tap/double tap/kicking over and uprighting 8" cones with min A for SLS   Overall Comments --  standing on blue AirEx tossing bags with weight shift x 20                PT Long Term Goals - 10/26/16 1059      PT LONG TERM GOAL #7   Title Improve TUG score to </= 12.0 secs without device to reduce fall risk.  (11-26-16)   Status On-going     PT LONG TERM GOAL #8   Title Increase gait velocity to >/= 3.2 ft/sec without device for incr. gait efficiency.  (11-26-16)   Status On-going     PT LONG TERM GOAL  #9   TITLE Increase SLS to > 5.0 secs on R and LLE to demo improved balance.  (11-26-16)   Status On-going     PT LONG TERM GOAL  #10   TITLE Amb. 200' with SPC on uneven terrain with S with pt demonstrating ability to recover LOB to demo improved balance.  (11-26-16)   Status On-going     PT LONG TERM GOAL  #11   TITLE Independent in HEP for balance/vestibular exercises.  (11-26-16)   Status On-going               Plan - 10/26/16 1102    Clinical Impression Statement PT session today focusing on balance tasks on both firm and compliant surfaces. Patient with most difficulty with SLS activities as well as eyes closed on faom. Patient with no LOB during gait with vertical and horizontal head turns. Patient to continue to benefit from PT to progress balance for reduced falls risk.    PT Treatment/Interventions ADLs/Self Care Home Management;Therapeutic activities;Therapeutic exercise;Cryotherapy;Electrical Stimulation;Neuromuscular re-education;Balance training;Functional mobility training;Patient/family education;Vasopneumatic Device;Manual techniques;Passive range of motion   PT Next Visit Plan balance: compliant surface training; shoulder: L shoulder ROM as tolerated/try isometrics   PT Home Exercise Plan balance   Consulted and Agree with Plan of Care Patient      Patient will benefit from skilled therapeutic  intervention in order to improve the following deficits and impairments:  Postural dysfunction, Decreased strength, Decreased range of motion, Impaired UE functional use, Decreased balance  Visit Diagnosis: Other abnormalities of gait and mobility  Unsteadiness on feet     Problem List Patient Active Problem List   Diagnosis Date Noted  . Abnormality of gait 09/07/2016  .  History of nephrectomy- Lt 06/16/2014  . Chronic renal impairment, stage 3 (moderate) 06/16/2014  . CAD-RCA/RI PCI in '97 05/15/2013  . Essential hypertension 05/15/2013  . Hyperlipidemia 05/15/2013      Lanney Gins, PT, DPT 10/26/16 11:06 AM     [facility] [address]  [place], [place], [address] Phone: [phone]   Fax:  [phone]  Name: Donald Todd MRN: [id number] Date of Birth: 04/04/1929

## 2016-11-01 ENCOUNTER — Ambulatory Visit: Payer: Medicare Other | Admitting: Physical Therapy

## 2016-11-01 DIAGNOSIS — R293 Abnormal posture: Secondary | ICD-10-CM | POA: Diagnosis not present

## 2016-11-01 DIAGNOSIS — M6281 Muscle weakness (generalized): Secondary | ICD-10-CM

## 2016-11-01 DIAGNOSIS — M25612 Stiffness of left shoulder, not elsewhere classified: Secondary | ICD-10-CM

## 2016-11-01 NOTE — Therapy (Signed)
[facility] [address]  [place], [place], [address] Phone: [phone]   Fax:  [phone]  Physical Therapy Treatment  Patient Details  Name: Donald Todd MRN: [id number] Date of Birth: [birth year] Referring Provider: Dr Lanny Hurst Willis/Dr Aaron Edelman Swinteck  Encounter Date: 11/01/2016      PT End of Session - 11/01/16 1013    Visit Number 8   Number of Visits 9   Date for PT Re-Evaluation 12/05/16   Authorization Type Medicare/Tricare    Authorization Time Period 09-27-16 - 11-26-16   PT Start Time 1010   PT Stop Time 1043  due to limited shoulder progression ability.    PT Time Calculation (min) 33 min   Activity Tolerance Patient tolerated treatment well   Behavior During Therapy WFL for tasks assessed/performed      Past Medical History:  Diagnosis Date  . Arthritis   . CAD (coronary artery disease)    last cath. 07-09-2010  . Cancer [facility])    kidney cancer , prostate cancer   . Chronic kidney disease   . H/O cardiovascular stress test 08-04-2010   ef 55% NO ISCHEMIA  . H/O unilateral nephrectomy    lt. nephrectomy  . Heart attack 1993  . Hyperlipemia   . Hypertension    renal dopplers 218.13-normal patency  . Inguinal hernia 08/01/11   right  . Prostate disease    Cancer S/P seed implant    Past Surgical History:  Procedure Laterality Date  . ANGIOPLASTY  1993  . athroscopic knee surgery  2002   right  . bone scan  2005  . CARDIAC CATHETERIZATION  07-09-2010   normal left ventricular function, coronary obsstructive disease with 20% proximal an 30-40% mid lt. anterior descending narrowing ; widely patent stent in the proximal ramus intermediate vessel; 50-70-% narrowing in the mid circumflex,and widely patent stent  in the rt. coronary   . CARDIAC CATHETERIZATION  12-22-2003   widely patent stents with noncritical coronary artery disease and normalLV function  . CHOLECYSTECTOMY  05/05/2009   Laparoscopic  . colonscopy  2004   with biopsy  . CORONARY ANGIOPLASTY WITH STENT PLACEMENT  03-15-1996   pci to rci and ramus branch using j&j [id number] stent,post dialated  at  20atm (rca)  and 14 atm (ramus branch. ef 55%            . HERNIA REPAIR  08/01/11   RIH  . INGUINAL HERNIA REPAIR Left 03/29/2013   Procedure: HERNIA REPAIR INGUINAL ADULT;  Surgeon: Edward Jolly, MD;  Location: [facility];  Service: General;  Laterality: Left;  . INSERTION OF MESH Left 03/29/2013   Procedure: INSERTION OF MESH;  Surgeon: Edward Jolly, MD;  Location: [facility];  Service: General;  Laterality: Left;  . JOINT REPLACEMENT  2006 and 2010   left 2006, right 2010  . KIDNEY SURGERY  2002 and 2005   2002 - partial removal of left. 2005 complete removal  . PROSTATE BIOPSY    . prostate seed implant    . radiation treatment  2002   prostate  . stent implants  1997    There were no vitals filed for this visit.      Subjective Assessment - 11/01/16 1011    Subjective Patient feels like his shoulder is getting a little better _ "it can go a little higher"   Pertinent History CAD, OA, MI, HTN, HLD, hx prostate and kidney cancer; R shoulder  dislocation with very limited motion   Diagnostic tests MRI brain: cerebral atrophy / MRI shoulder: complete RTC tear   Patient Stated Goals Improve balance, and shoulder   Currently in Pain? No/denies                         [facility] Adult PT Treatment/Exercise - 11/01/16 1012      Shoulder Exercises: Seated   Flexion Left;15 reps   Flexion Limitations eccentric control - requires AA for both elevation and lowering x 15     Shoulder Exercises: Standing   Internal Rotation Strengthening;Left;15 reps   Theraband Level (Shoulder Internal Rotation) Level 3 (Green)   Internal Rotation Limitations eccentric control to neutral   Flexion AAROM;Left;10 reps   Flexion Limitations wall ladder with hold at top   Extension Strengthening;Both;15  reps;Theraband   Theraband Level (Shoulder Extension) Level 3 (Green)   Extension Limitations with scap squeeze x 2 sets   Row Strengthening;Both;15 reps;Theraband   Theraband Level (Shoulder Row) Level 3 (Green)   Row Limitations with scap squeeze x 2 sets     Shoulder Exercises: Pulleys   Flexion 3 minutes   ABduction 3 minutes     Shoulder Exercises: ROM/Strengthening   UBE (Upper Arm Bike) level 3 x 6' (3' fwd/ 3' bwd)     Shoulder Exercises: Isometric Strengthening   Flexion Limitations 15 x 5" hold   Extension Limitations 15 x 5" hold   External Rotation Limitations 15 x 5" hold   Internal Rotation --  15 x 5" hold   ABduction Limitations 15 x 5" hold                PT Education - 11/01/16 1023    Education provided Yes   Education Details isometrics for shoulder strengthening   Person(s) Educated Patient   Methods Explanation;Demonstration;Handout   Comprehension Verbalized understanding;Returned demonstration             PT Long Term Goals - 11/01/16 1013      PT LONG TERM GOAL #1   Title independent with HEP for L shoulder (12/05/16)   Status On-going     PT LONG TERM GOAL #2   Title improve AROM L shoulder to 75 degrees flexion, 90 degrees abduction for improved function (12/05/16)   Status On-going     PT LONG TERM GOAL #3   Title demonstrate ability to reach light objects (1-2#) overhead without increase in pain for improved function (12/05/16)   Status On-going     PT LONG TERM GOAL #4   Title n/a     PT LONG TERM GOAL #5   Title n/a               Plan - 11/01/16 1014    Clinical Impression Statement PT today focusing on shoulder strengthening in available range. Isometrics performed in all directions with patient able to perform with no increase in pain. Patient easily fatigues and is limited with eccentric control, however is able to maintain static forward flexion hold with greater control. Patient to continue to benefit form PT to  progress functional use of UE.    PT Treatment/Interventions ADLs/Self Care Home Management;Therapeutic activities;Therapeutic exercise;Cryotherapy;Electrical Stimulation;Neuromuscular re-education;Balance training;Functional mobility training;Patient/family education;Vasopneumatic Device;Manual techniques;Passive range of motion   PT Next Visit Plan balance: compliant surface training; shoulder: L shoulder ROM as tolerated/try isometrics   PT Home Exercise Plan balance   Consulted and Agree with Plan of Care Patient  Patient will benefit from skilled therapeutic intervention in order to improve the following deficits and impairments:  Postural dysfunction, Decreased strength, Decreased range of motion, Impaired UE functional use, Decreased balance  Visit Diagnosis: Stiffness of left shoulder, not elsewhere classified  Abnormal posture  Muscle weakness (generalized)     Problem List Patient Active Problem List   Diagnosis Date Noted  . Abnormality of gait 09/07/2016  . History of nephrectomy- Lt 06/16/2014  . Chronic renal impairment, stage 3 (moderate) 06/16/2014  . CAD-RCA/RI PCI in '97 05/15/2013  . Essential hypertension 05/15/2013  . Hyperlipidemia 05/15/2013       Lanney Gins, PT, DPT 11/01/16 10:50 AM     [facility] [address]  [place], [place], [address] Phone: [phone]   Fax:  [phone]  Name: Donald Todd MRN: [id number] Date of Birth: 1929/11/02

## 2016-11-01 NOTE — Patient Instructions (Signed)
Strengthening: Isometric Flexion  Using wall for resistance, press right fist into ball using light pressure. Hold _5___ seconds. Repeat __15__ times per set.   SHOULDER: Abduction (Isometric)  Use wall as resistance. Press arm against pillow. Keep elbow straight. Hold _5__ seconds. _15__ reps  Extension (Isometric)  Place left bent elbow and back of arm against wall. Press elbow against wall. Hold _5___ seconds. Repeat __15__ times.   Internal Rotation (Isometric)  Place palm of right fist against door frame, with elbow bent. Press fist against door frame. Hold __5__ seconds. Repeat __15__ times.   External Rotation (Isometric)  Place back of left fist against door frame, with elbow bent. Press fist against door frame. Hold __5__ seconds. Repeat __15__ times.

## 2016-11-04 ENCOUNTER — Ambulatory Visit: Payer: Medicare Other | Attending: Neurology | Admitting: Physical Therapy

## 2016-11-04 DIAGNOSIS — R293 Abnormal posture: Secondary | ICD-10-CM | POA: Diagnosis not present

## 2016-11-04 DIAGNOSIS — R2689 Other abnormalities of gait and mobility: Secondary | ICD-10-CM | POA: Diagnosis not present

## 2016-11-04 DIAGNOSIS — M25612 Stiffness of left shoulder, not elsewhere classified: Secondary | ICD-10-CM | POA: Diagnosis not present

## 2016-11-04 DIAGNOSIS — M6281 Muscle weakness (generalized): Secondary | ICD-10-CM | POA: Diagnosis not present

## 2016-11-04 DIAGNOSIS — R2681 Unsteadiness on feet: Secondary | ICD-10-CM | POA: Diagnosis not present

## 2016-11-04 NOTE — Therapy (Signed)
[facility] [address]  [place], [place], [address] Phone: [phone]   Fax:  [phone]  Physical Therapy Treatment  Patient Details  Name: Donald Todd MRN: [id number] Date of Birth: Sep 01, 1929 Referring Provider: Dr Lanny Hurst Willis/Dr  Edelman Swinteck  Encounter Date: 11/04/2016      PT End of Session - 11/04/16 1018    Visit Number 9   Number of Visits 15   Date for PT Re-Evaluation 11/25/16   Authorization Type Medicare/Tricare    Authorization Time Period 09-27-16 - 11-26-16   PT Start Time 1016   PT Stop Time 1059   PT Time Calculation (min) 43 min   Activity Tolerance Patient tolerated treatment well   Behavior During Therapy [facility] for tasks assessed/performed      Past Medical History:  Diagnosis Date  . Arthritis   . CAD (coronary artery disease)    last cath. 07-09-2010  . Cancer [facility])    kidney cancer , prostate cancer   . Chronic kidney disease   . H/O cardiovascular stress test 08-04-2010   ef 55% NO ISCHEMIA  . H/O unilateral nephrectomy    lt. nephrectomy  . Heart attack 1993  . Hyperlipemia   . Hypertension    renal dopplers 218.13-normal patency  . Inguinal hernia 08/01/11   right  . Prostate disease    Cancer S/P seed implant    Past Surgical History:  Procedure Laterality Date  . ANGIOPLASTY  1993  . athroscopic knee surgery  2002   right  . bone scan  2005  . CARDIAC CATHETERIZATION  07-09-2010   normal left ventricular function, coronary obsstructive disease with 20% proximal an 30-40% mid lt. anterior descending narrowing ; widely patent stent in the proximal ramus intermediate vessel; 50-70-% narrowing in the mid circumflex,and widely patent stent  in the rt. coronary   . CARDIAC CATHETERIZATION  12-22-2003   widely patent stents with noncritical coronary artery disease and normalLV function  . CHOLECYSTECTOMY  05/05/2009   Laparoscopic  . colonscopy  2004   with biopsy  .  CORONARY ANGIOPLASTY WITH STENT PLACEMENT  03-15-1996   pci to rci and ramus branch using j&j [id number] stent,post dialated  at  20atm (rca)  and 14 atm (ramus branch. ef 55%            . HERNIA REPAIR  08/01/11   RIH  . INGUINAL HERNIA REPAIR Left 03/29/2013   Procedure: HERNIA REPAIR INGUINAL ADULT;  Surgeon: Edward Jolly, MD;  [facility];  Service: General;  Laterality: Left;  . INSERTION OF MESH Left 03/29/2013   Procedure: INSERTION OF MESH;  Surgeon: Edward Jolly, MD;  [facility];  Service: General;  Laterality: Left;  . JOINT REPLACEMENT  2006 and 2010   left 2006, right 2010  . KIDNEY SURGERY  2002 and 2005   2002 - partial removal of left. 2005 complete removal  . PROSTATE BIOPSY    . prostate seed implant    . radiation treatment  2002   prostate  . stent implants  1997    There were no vitals filed for this visit.      Subjective Assessment - 11/04/16 1017    Subjective patient stating his shoulder has a little pain and knees feel tight - no pain    Pertinent History CAD, OA, MI, HTN, HLD, hx prostate and kidney cancer; R shoulder dislocation with very limited motion   Diagnostic  tests MRI brain: cerebral atrophy / MRI shoulder: complete RTC tear   Patient Stated Goals Improve balance, and shoulder   Currently in Pain? No/denies   Pain Score 0-No pain            [facility] PT Assessment - 11/04/16 0001      AROM   Left Shoulder Flexion 45 Degrees   Left Shoulder ABduction 62 Degrees     Ambulation/Gait   Gait velocity 2.73 ft/sec  exact same measurement from inital eval     Standardized Balance Assessment   Standardized Balance Assessment Timed Up and Go Test     Timed Up and Go Test   Normal TUG (seconds) 11.38  no device                     [facility] Adult PT Treatment/Exercise - 11/04/16 0001      Knee/Hip Exercises: Aerobic   Stationary Bike L1 x 8 minutes             Balance Exercises - 11/04/16 1144       Balance Exercises: Standing   Standing Eyes Opened Narrow base of support (BOS);Foam/compliant surface;5 reps;30 secs   Tandem Stance Eyes open;2 reps;30 secs  each LE    Standing, One Foot on a Step Eyes open;2 inch  foot up on "bubble" - unable to maintain >10 seconds   Gait with Head Turns Forward;Cognitive challenge  150' vertical head turns; 150' horizontal; 150' cognitive     Balance Exercises: Seated   Other Seated Exercises sitting on green physioball with alternating LE marches 2 x 20; alternate LE kickouts 2 x 20                PT Long Term Goals - 11/04/16 1023      PT LONG TERM GOAL #1   Title independent with HEP for L shoulder (12/05/16)   Status Achieved     PT LONG TERM GOAL #2   Title improve AROM L shoulder to 75 degrees flexion, 90 degrees abduction for improved function (12/05/16)   Status On-going     PT LONG TERM GOAL #3   Title demonstrate ability to reach light objects (1-2#) overhead without increase in pain for improved function (12/05/16)   Status On-going     PT LONG TERM GOAL #4   Title n/a     PT LONG TERM GOAL #5   Title n/a     PT LONG TERM GOAL #6   Title n/a     PT LONG TERM GOAL #7   Title Improve TUG score to </= 12.0 secs without device to reduce fall risk.  (11-26-16)   Status Achieved  Today scoreing 11.38 seconds     PT LONG TERM GOAL #8   Title Increase gait velocity to >/= 3.2 ft/sec without device for incr. gait efficiency.  (11-26-16)   Status On-going  2.73 ft/sec; same as at inital eval     PT LONG TERM GOAL  #9   TITLE Increase SLS to > 5.0 secs on R and LLE to demo improved balance.  (11-26-16)   Status On-going     PT LONG TERM GOAL  #10   TITLE Amb. 200' with SPC on uneven terrain with S with pt demonstrating ability to recover LOB to demo improved balance.  (11-26-16)   Status On-going     PT LONG TERM GOAL  #11   TITLE Independent in HEP for balance/vestibular exercises.  (11-26-16)  Status Achieved                Plan - 11/04/16 1150    Clinical Impression Statement PT session today focusing on balance. Patient due for re-cert today with PT assessing TUG, gait speed, SLS time, as well as shoulder ROM. Patient currenlty seen 2x/week, with 1x/week for shoulder and 1x/week for balance training. Patient making small clinical changes with shoulder ROM/strength, however, paitent reports he feels like it is getting a little better. SLS and gait valocity has remained the same, however, TUG time has improved by approx 2 secconds. Patient progresisng well with tandem stance and narrow BOS on compliant surfaces. Re-cert for 3 week extension with patient stating he will comfortable for discharge at that time. Patient to continue to benefit from continued PT to progress L shoulder ROM for improved functional use of as well as improved balane for reduced fall risk.    Rehab Potential Good   PT Frequency 2x / week  1x/week shoulder; 1x/week balance   PT Duration 3 weeks   PT Treatment/Interventions ADLs/Self Care Home Management;Therapeutic activities;Therapeutic exercise;Cryotherapy;Electrical Stimulation;Neuromuscular re-education;Balance training;Functional mobility training;Patient/family education;Vasopneumatic Device;Manual techniques;Passive range of motion   PT Next Visit Plan balance: compliant surface training; shoulder: L shoulder ROM as tolerated   PT Home Exercise Plan balance   Consulted and Agree with Plan of Care Patient      Patient will benefit from skilled therapeutic intervention in order to improve the following deficits and impairments:  Postural dysfunction, Decreased strength, Decreased range of motion, Impaired UE functional use, Decreased balance  Visit Diagnosis: Other abnormalities of gait and mobility - Plan: PT plan of care cert/re-cert  Unsteadiness on feet - Plan: PT plan of care cert/re-cert     Problem List Patient Active Problem List   Diagnosis Date Noted   . Abnormality of gait 09/07/2016  . History of nephrectomy- Lt 06/16/2014  . Chronic renal impairment, stage 3 (moderate) 06/16/2014  . CAD-RCA/RI PCI in '97 05/15/2013  . Essential hypertension 05/15/2013  . Hyperlipidemia 05/15/2013      Lanney Gins, PT, DPT 11/04/16 12:11 PM    [facility] [address]  [place], [place], [address] Phone: [phone]   Fax:  [phone]  Name: LENNOX DOLBERRY MRN: [id number] Date of Birth: 1929-11-16

## 2016-11-08 ENCOUNTER — Ambulatory Visit: Payer: Medicare Other | Admitting: Physical Therapy

## 2016-11-08 DIAGNOSIS — R2689 Other abnormalities of gait and mobility: Secondary | ICD-10-CM | POA: Diagnosis not present

## 2016-11-08 DIAGNOSIS — M25612 Stiffness of left shoulder, not elsewhere classified: Secondary | ICD-10-CM | POA: Diagnosis not present

## 2016-11-08 DIAGNOSIS — M6281 Muscle weakness (generalized): Secondary | ICD-10-CM | POA: Diagnosis not present

## 2016-11-08 DIAGNOSIS — R293 Abnormal posture: Secondary | ICD-10-CM | POA: Diagnosis not present

## 2016-11-08 DIAGNOSIS — R2681 Unsteadiness on feet: Secondary | ICD-10-CM | POA: Diagnosis not present

## 2016-11-08 NOTE — Therapy (Signed)
[facility] [address]  [place], [place], [address] Phone: [phone]   Fax:  [phone]  Physical Therapy Treatment  Patient Details  Name: Donald Todd MRN: [id number] Date of Birth: 08-12-1929 Referring Provider: Dr Lanny Hurst Willis/Dr Johathon Overturf Edelman Swinteck  Encounter Date: 11/08/2016      PT End of Session - 11/08/16 1229    Visit Number 10   Number of Visits 15   Date for PT Re-Evaluation 11/25/16   Authorization Type Medicare/Tricare    Authorization Time Period 09-27-16 - 11-26-16   PT Start Time 1015   PT Stop Time 1054   PT Time Calculation (min) 39 min   Activity Tolerance Patient tolerated treatment well   Behavior During Therapy [facility] for tasks assessed/performed      Past Medical History:  Diagnosis Date  . Arthritis   . CAD (coronary artery disease)    last cath. 07-09-2010  . Cancer [facility])    kidney cancer , prostate cancer   . Chronic kidney disease   . H/O cardiovascular stress test 08-04-2010   ef 55% NO ISCHEMIA  . H/O unilateral nephrectomy    lt. nephrectomy  . Heart attack 1993  . Hyperlipemia   . Hypertension    renal dopplers 218.13-normal patency  . Inguinal hernia 08/01/11   right  . Prostate disease    Cancer S/P seed implant    Past Surgical History:  Procedure Laterality Date  . ANGIOPLASTY  1993  . athroscopic knee surgery  2002   right  . bone scan  2005  . CARDIAC CATHETERIZATION  07-09-2010   normal left ventricular function, coronary obsstructive disease with 20% proximal an 30-40% mid lt. anterior descending narrowing ; widely patent stent in the proximal ramus intermediate vessel; 50-70-% narrowing in the mid circumflex,and widely patent stent  in the rt. coronary   . CARDIAC CATHETERIZATION  12-22-2003   widely patent stents with noncritical coronary artery disease and normalLV function  . CHOLECYSTECTOMY  05/05/2009   Laparoscopic  . colonscopy  2004   with biopsy  .  CORONARY ANGIOPLASTY WITH STENT PLACEMENT  03-15-1996   pci to rci and ramus branch using j&j [id number] stent,post dialated  at  20atm (rca)  and 14 atm (ramus branch. ef 55%            . HERNIA REPAIR  08/01/11   RIH  . INGUINAL HERNIA REPAIR Left 03/29/2013   Procedure: HERNIA REPAIR INGUINAL ADULT;  Surgeon: Edward Jolly, MD;  [facility];  Service: General;  Laterality: Left;  . INSERTION OF MESH Left 03/29/2013   Procedure: INSERTION OF MESH;  Surgeon: Edward Jolly, MD;  [facility];  Service: General;  Laterality: Left;  . JOINT REPLACEMENT  2006 and 2010   left 2006, right 2010  . KIDNEY SURGERY  2002 and 2005   2002 - partial removal of left. 2005 complete removal  . PROSTATE BIOPSY    . prostate seed implant    . radiation treatment  2002   prostate  . stent implants  1997    There were no vitals filed for this visit.      Subjective Assessment - 11/08/16 1018    Subjective patient feeling well, no complaints   Pertinent History CAD, OA, MI, HTN, HLD, hx prostate and kidney cancer; R shoulder dislocation with very limited motion   Diagnostic tests MRI brain: cerebral atrophy / MRI shoulder: complete RTC tear  Patient Stated Goals Improve balance, and shoulder   Currently in Pain? Yes   Pain Score 4    Pain Location Shoulder   Pain Orientation Left   Pain Descriptors / Indicators Aching   Pain Type Chronic pain   Aggravating Factors  lifting, use   Pain Relieving Factors rest                         [facility] Adult PT Treatment/Exercise - 11/08/16 1020      Shoulder Exercises: Seated   Flexion Limitations eccentric control - requires AA for both elevation and lowering x 15     Shoulder Exercises: Standing   Internal Rotation Strengthening;Left;15 reps   Theraband Level (Shoulder Internal Rotation) Level 4 (Blue)   Internal Rotation Limitations eccentric control to neutral   Flexion AAROM;Left;15 reps   Flexion Limitations wall  ladder with hold at top   Extension Strengthening;Both;15 reps;Theraband   Theraband Level (Shoulder Extension) Level 4 (Blue)   Extension Limitations with scap squeeze x 2 sets   Row Strengthening;Both;15 reps;Theraband   Theraband Level (Shoulder Row) Level 4 (Blue)   Row Limitations with scap squeeze x 2 sets     Shoulder Exercises: Pulleys   Flexion 3 minutes   ABduction 3 minutes     Shoulder Exercises: ROM/Strengthening   UBE (Upper Arm Bike) Level 3 x 6 minutes (3 fwd/ 3 bwd)     Shoulder Exercises: Isometric Strengthening   Flexion Limitations 15 x 5" hold   Extension Limitations 15 x 5" hold   External Rotation Limitations 15 x 5" hold   Internal Rotation --  15 x 5" hold   ABduction Limitations 15 x 5" hold                     PT Long Term Goals - 11/08/16 1229      PT LONG TERM GOAL #1   Title independent with HEP for L shoulder (12/05/16)   Status Achieved     PT LONG TERM GOAL #2   Title improve AROM L shoulder to 75 degrees flexion, 90 degrees abduction for improved function (12/05/16)   Status On-going     PT LONG TERM GOAL #3   Title demonstrate ability to reach light objects (1-2#) overhead without increase in pain for improved function (12/05/16)   Status On-going     PT LONG TERM GOAL #4   Title n/a     PT LONG TERM GOAL #5   Title n/a               Plan - 11/08/16 1229    Clinical Impression Statement PT session today focusing on shoulder ROM and strengthening. Patient performing isometrics well with patient stating he feels like he can move his shoulder better following that task. Patient unable to perform AAROM of "wall wash" today due to strength deficits. Continued work with eccentric forward flexion with patient conintuing to need assist from PT. Patient to continue to benefit from PT to maximize function.    PT Treatment/Interventions ADLs/Self Care Home Management;Therapeutic activities;Therapeutic  exercise;Cryotherapy;Electrical Stimulation;Neuromuscular re-education;Balance training;Functional mobility training;Patient/family education;Vasopneumatic Device;Manual techniques;Passive range of motion   PT Next Visit Plan balance: compliant surface training; shoulder: L shoulder ROM as tolerated   Consulted and Agree with Plan of Care Patient      Patient will benefit from skilled therapeutic intervention in order to improve the following deficits and impairments:  Postural dysfunction, Decreased strength,  Decreased range of motion, Impaired UE functional use, Decreased balance  Visit Diagnosis: Stiffness of left shoulder, not elsewhere classified  Abnormal posture  Muscle weakness (generalized)       G-Codes - 11-26-16 1235    Functional Assessment Tool Used TUG: 11.38 seconds with no device, gait velocity 2.73 ft/sec with no device, continued difficulty with SL stance, narrow BOS, as well as compliant surfaces.    Functional Limitation Mobility: Walking and moving around   Mobility: Walking and Moving Around Current Status [phone]) At least 20 percent but less than 40 percent impaired, limited or restricted   Mobility: Walking and Moving Around Goal Status [phone]) At least 1 percent but less than 20 percent impaired, limited or restricted      Problem List Patient Active Problem List   Diagnosis Date Noted  . Abnormality of gait 09/07/2016  . History of nephrectomy- Lt 06/16/2014  . Chronic renal impairment, stage 3 (moderate) 06/16/2014  . CAD-RCA/RI PCI in '97 05/15/2013  . Essential hypertension 05/15/2013  . Hyperlipidemia 05/15/2013      Lanney Gins, PT, DPT 2016/11/26 12:38 PM    [facility] [address]  [place], [place], [address] Phone: [phone]   Fax:  [phone]  Name: HARRIE CAZAREZ MRN: [id number] Date of Birth: December 26, 1928

## 2016-11-11 ENCOUNTER — Ambulatory Visit: Payer: Medicare Other | Attending: Orthopedic Surgery | Admitting: Physical Therapy

## 2016-11-11 DIAGNOSIS — R2689 Other abnormalities of gait and mobility: Secondary | ICD-10-CM | POA: Insufficient documentation

## 2016-11-11 DIAGNOSIS — R2681 Unsteadiness on feet: Secondary | ICD-10-CM | POA: Diagnosis not present

## 2016-11-11 NOTE — Therapy (Signed)
[facility] [address]  [place], [place], [address] Phone: [phone]   Fax:  [phone]  Physical Therapy Treatment  Patient Details  Name: Donald Todd MRN: [id number] Date of Birth: [birth year] Referring Provider: Dr Lanny Hurst Willis/Dr Aaron Edelman Swinteck  Encounter Date: 11/11/2016      PT End of Session - 11/11/16 0930    Visit Number 11   Number of Visits 15   Date for PT Re-Evaluation 11/25/16   Authorization Type Medicare/Tricare    Authorization Time Period 09-27-16 - 11-26-16   PT Start Time 0929   PT Stop Time 1010   PT Time Calculation (min) 41 min   Activity Tolerance Patient tolerated treatment well   Behavior During Therapy [facility] for tasks assessed/performed      Past Medical History:  Diagnosis Date  . Arthritis   . CAD (coronary artery disease)    last cath. 07-09-2010  . Cancer [facility])    kidney cancer , prostate cancer   . Chronic kidney disease   . H/O cardiovascular stress test 08-04-2010   ef 55% NO ISCHEMIA  . H/O unilateral nephrectomy    lt. nephrectomy  . Heart attack 1993  . Hyperlipemia   . Hypertension    renal dopplers 218.13-normal patency  . Inguinal hernia 08/01/11   right  . Prostate disease    Cancer S/P seed implant    Past Surgical History:  Procedure Laterality Date  . ANGIOPLASTY  1993  . athroscopic knee surgery  2002   right  . bone scan  2005  . CARDIAC CATHETERIZATION  07-09-2010   normal left ventricular function, coronary obsstructive disease with 20% proximal an 30-40% mid lt. anterior descending narrowing ; widely patent stent in the proximal ramus intermediate vessel; 50-70-% narrowing in the mid circumflex,and widely patent stent  in the rt. coronary   . CARDIAC CATHETERIZATION  12-22-2003   widely patent stents with noncritical coronary artery disease and normalLV function  . CHOLECYSTECTOMY  05/05/2009   Laparoscopic  . colonscopy  2004   with biopsy  .  CORONARY ANGIOPLASTY WITH STENT PLACEMENT  03-15-1996   pci to rci and ramus branch using j&j [id number] stent,post dialated  at  20atm (rca)  and 14 atm (ramus branch. ef 55%            . HERNIA REPAIR  08/01/11   RIH  . INGUINAL HERNIA REPAIR Left 03/29/2013   Procedure: HERNIA REPAIR INGUINAL ADULT;  Surgeon: Edward Jolly, MD;  Location: [facility];  Service: General;  Laterality: Left;  . INSERTION OF MESH Left 03/29/2013   Procedure: INSERTION OF MESH;  Surgeon: Edward Jolly, MD;  Location: [facility];  Service: General;  Laterality: Left;  . JOINT REPLACEMENT  2006 and 2010   left 2006, right 2010  . KIDNEY SURGERY  2002 and 2005   2002 - partial removal of left. 2005 complete removal  . PROSTATE BIOPSY    . prostate seed implant    . radiation treatment  2002   prostate  . stent implants  1997    There were no vitals filed for this visit.      Subjective Assessment - 11/11/16 0930    Subjective patient is doing well - no pain   Pertinent History CAD, OA, MI, HTN, HLD, hx prostate and kidney cancer; R shoulder dislocation with very limited motion   Diagnostic tests MRI brain: cerebral atrophy / MRI shoulder: complete  RTC tear   Patient Stated Goals Improve balance, and shoulder   Currently in Pain? No/denies   Pain Score 0-No pain                         [facility] Adult PT Treatment/Exercise - 11/11/16 0001      Knee/Hip Exercises: Aerobic   Stationary Bike L2 x 8 minutes             Balance Exercises - 11/11/16 1019      Balance Exercises: Standing   Standing Eyes Opened Narrow base of support (BOS);Foam/compliant surface;2 reps  60 seconds   Tandem Stance Eyes open;2 reps;30 secs  BLE   SLS Eyes open;2 reps  <10 seconds B LE   Gait with Head Turns Forward;Cognitive challenge  150' vertical, 150' horizontal, 300' cognitive   Cone Rotation Limitations figure 8; firm surfaces x 8 reps   Sit to Stand Time sit to stand from low mat table x 10;  standing on blue [employer]   Other Standing Exercises walking over cones and around pebbles 30 feet x 4           PT Education - 11/11/16 1023    Education provided Yes   Education Details safe practive at home for tandem and SL stance   Person(s) Educated Patient   Methods Explanation;Demonstration   Comprehension Verbalized understanding;Returned demonstration             PT Long Term Goals - 11/11/16 1023      PT LONG TERM GOAL #7   Title Improve TUG score to </= 12.0 secs without device to reduce fall risk.  (11-26-16)   Status New     PT LONG TERM GOAL #8   Title Increase gait velocity to >/= 3.2 ft/sec without device for incr. gait efficiency.  (11-26-16)   Status On-going     PT LONG TERM GOAL  #9   TITLE Increase SLS to > 5.0 secs on R and LLE to demo improved balance.  (11-26-16)   Status On-going     PT LONG TERM GOAL  #10   TITLE Amb. 200' with SPC on uneven terrain with S with pt demonstrating ability to recover LOB to demo improved balance.  (11-26-16)   Status On-going     PT LONG TERM GOAL  #11   TITLE Independent in HEP for balance/vestibular exercises.  (11-26-16)   Status Achieved               Plan - 11/11/16 1024    Clinical Impression Statement PT session today focusing on balance tasks with head turns, cognitive tasks, as well as narrow BOS and tandem stance on firm and compliant surfaces. Patient reporting difficulty at home with figure 8 walking - demonstrated today in session with patient performing with no LOB and no reported dizziness. Patient continues to have most difficulty with tandem stance and SL stance on firm ground with education provided on how to practice safety at home with good carryover.    PT Treatment/Interventions ADLs/Self Care Home Management;Therapeutic activities;Therapeutic exercise;Cryotherapy;Electrical Stimulation;Neuromuscular re-education;Balance training;Functional mobility training;Patient/family  education;Vasopneumatic Device;Manual techniques;Passive range of motion   PT Next Visit Plan balance: compliant surface training; shoulder: L shoulder ROM as tolerated   Consulted and Agree with Plan of Care Patient      Patient will benefit from skilled therapeutic intervention in order to improve the following deficits and impairments:  Postural dysfunction, Decreased strength, Decreased range of  motion, Impaired UE functional use, Decreased balance  Visit Diagnosis: Other abnormalities of gait and mobility  Unsteadiness on feet     Problem List Patient Active Problem List   Diagnosis Date Noted  . Abnormality of gait 09/07/2016  . History of nephrectomy- Lt 06/16/2014  . Chronic renal impairment, stage 3 (moderate) 06/16/2014  . CAD-RCA/RI PCI in '97 05/15/2013  . Essential hypertension 05/15/2013  . Hyperlipidemia 05/15/2013      Lanney Gins, PT, DPT 11/11/16 10:28 AM    [facility] [address]  [place], [place], [address] Phone: [phone]   Fax:  [phone]  Name: Donald Todd MRN: [id number] Date of Birth: [birth year]

## 2016-11-15 ENCOUNTER — Ambulatory Visit: Payer: Medicare Other | Admitting: Physical Therapy

## 2016-11-15 DIAGNOSIS — R293 Abnormal posture: Secondary | ICD-10-CM | POA: Diagnosis not present

## 2016-11-15 DIAGNOSIS — M25612 Stiffness of left shoulder, not elsewhere classified: Secondary | ICD-10-CM | POA: Diagnosis not present

## 2016-11-15 DIAGNOSIS — R2681 Unsteadiness on feet: Secondary | ICD-10-CM | POA: Diagnosis not present

## 2016-11-15 DIAGNOSIS — R2689 Other abnormalities of gait and mobility: Secondary | ICD-10-CM | POA: Diagnosis not present

## 2016-11-15 DIAGNOSIS — M6281 Muscle weakness (generalized): Secondary | ICD-10-CM | POA: Diagnosis not present

## 2016-11-15 NOTE — Therapy (Signed)
[facility] [address]  [place], [place], [address] Phone: [phone]   Fax:  [phone]  Physical Therapy Treatment  Patient Details  Name: Donald Todd MRN: [id number] Date of Birth: 07-15-1929 Referring Provider: Dr Lanny Hurst Willis/Dr  Edelman Swinteck  Encounter Date: 11/15/2016      PT End of Session - 11/15/16 1102    Visit Number 12   Number of Visits 15   Date for PT Re-Evaluation 11/25/16   Authorization Type Medicare/Tricare    Authorization Time Period 09-27-16 - 11-26-16   PT Start Time 1100   PT Stop Time 1144   PT Time Calculation (min) 44 min   Activity Tolerance Patient tolerated treatment well   Behavior During Therapy [facility] for tasks assessed/performed      Past Medical History:  Diagnosis Date  . Arthritis   . CAD (coronary artery disease)    last cath. 07-09-2010  . Cancer [facility])    kidney cancer , prostate cancer   . Chronic kidney disease   . H/O cardiovascular stress test 08-04-2010   ef 55% NO ISCHEMIA  . H/O unilateral nephrectomy    lt. nephrectomy  . Heart attack 1993  . Hyperlipemia   . Hypertension    renal dopplers 218.13-normal patency  . Inguinal hernia 08/01/11   right  . Prostate disease    Cancer S/P seed implant    Past Surgical History:  Procedure Laterality Date  . ANGIOPLASTY  1993  . athroscopic knee surgery  2002   right  . bone scan  2005  . CARDIAC CATHETERIZATION  07-09-2010   normal left ventricular function, coronary obsstructive disease with 20% proximal an 30-40% mid lt. anterior descending narrowing ; widely patent stent in the proximal ramus intermediate vessel; 50-70-% narrowing in the mid circumflex,and widely patent stent  in the rt. coronary   . CARDIAC CATHETERIZATION  12-22-2003   widely patent stents with noncritical coronary artery disease and normalLV function  . CHOLECYSTECTOMY  05/05/2009   Laparoscopic  . colonscopy  2004   with biopsy   . CORONARY ANGIOPLASTY WITH STENT PLACEMENT  03-15-1996   pci to rci and ramus branch using j&j [id number] stent,post dialated  at  20atm (rca)  and 14 atm (ramus branch. ef 55%            . HERNIA REPAIR  08/01/11   RIH  . INGUINAL HERNIA REPAIR Left 03/29/2013   Procedure: HERNIA REPAIR INGUINAL ADULT;  Surgeon: Edward Jolly, MD;  [facility];  Service: General;  Laterality: Left;  . INSERTION OF MESH Left 03/29/2013   Procedure: INSERTION OF MESH;  Surgeon: Edward Jolly, MD;  [facility];  Service: General;  Laterality: Left;  . JOINT REPLACEMENT  2006 and 2010   left 2006, right 2010  . KIDNEY SURGERY  2002 and 2005   2002 - partial removal of left. 2005 complete removal  . PROSTATE BIOPSY    . prostate seed implant    . radiation treatment  2002   prostate  . stent implants  1997    There were no vitals filed for this visit.      Subjective Assessment - 11/15/16 1101    Subjective patient feeling arm is a little stiff today   Pertinent History CAD, OA, MI, HTN, HLD, hx prostate and kidney cancer; R shoulder dislocation with very limited motion   Diagnostic tests MRI brain: cerebral atrophy / MRI shoulder:  complete RTC tear   Patient Stated Goals Improve balance, and shoulder   Currently in Pain? Yes   Pain Score 2    Pain Location Shoulder   Pain Orientation Left   Pain Descriptors / Indicators Tightness   Pain Type Chronic pain   Aggravating Factors  lifting, use   Pain Relieving Factors rest            [facility] PT Assessment - 11/15/16 0001      AROM   Left Shoulder Flexion 51 Degrees   Left Shoulder ABduction 73 Degrees                     [facility] Adult PT Treatment/Exercise - 11/15/16 0001      Shoulder Exercises: Prone   Retraction Left;10 reps     Shoulder Exercises: Standing   External Rotation AAROM;Left;5 reps   Internal Rotation Strengthening;Left;15 reps   Theraband Level (Shoulder Internal Rotation) Level 4 (Blue)    Internal Rotation Limitations eccentric control to neutral   Flexion AAROM;Left;10 reps   Flexion Limitations wall ladder with hold at top   Extension Strengthening;Both;15 reps;Theraband   Theraband Level (Shoulder Extension) Level 4 (Blue)   Extension Limitations VC for scap squeeze and reduced compensation by UT   [employer];Theraband   Theraband Level (Shoulder Row) Level 4 (Blue)   Row Limitations with scap squeeze     Shoulder Exercises: ROM/Strengthening   UBE (Upper Arm Bike) Level 3 x 6 minutes (3 fwd/ 3 bwd)     Shoulder Exercises: Isometric Strengthening   Flexion Limitations 15 x 5" hold   Extension Limitations 15 x 5" hold   External Rotation Limitations 15 x 5" hold   Internal Rotation --  15 x 5" hold   ABduction Limitations 15 x 5" hold                PT Education - 11/15/16 1202    Education provided Yes   Education Details handout on pulley and adjustable ankle weights for HEP   Person(s) Educated Patient   Methods Explanation;Handout   Comprehension Verbalized understanding;Returned demonstration             PT Long Term Goals - 11/15/16 1102      PT LONG TERM GOAL #1   Title independent with HEP for L shoulder (12/05/16)   Status Achieved     PT LONG TERM GOAL #2   Title improve AROM L shoulder to 75 degrees flexion, 90 degrees abduction for improved function (12/05/16)   Status Partially Met  L shoulder flexion - 51; L shoulder abduction 73; all in supine - improved since initial eval     PT LONG TERM GOAL #3   Title demonstrate ability to reach light objects (1-2#) overhead without increase in pain for improved function (12/05/16)   Status Not Met  unable to lift L UE overhead; max forward flexion in supine at 51 degrees     PT LONG TERM GOAL #4   Title n/a     PT LONG TERM GOAL #5   Title n/a               Plan - 11/15/16 1120    Clinical Impression Statement PT session today is last scheduled session  for shoulder ROM/strengthening with patient confortable with extensive HEP and stretching program. Review of all HEP tasks such as isometrics, periscapular strengthening as well as RTC strengthening as able. Reassessed flexion and abduction AROM today  in supine with patient achieving 51 degrees of forward flexion and 73 degrees of abduction. Patient with little to no active external rotation with education to contiue AAROM for hopeful progression to AROM.  Discussion with patient today regarding acquiring home pulley system as well as possibly attending gym to utilize UBE to maintain functional progress.  Patient to return later this week to conclude PT services at this time and to review home balance program.   PT Treatment/Interventions ADLs/Self Care Home Management;Therapeutic activities;Therapeutic exercise;Cryotherapy;Electrical Stimulation;Neuromuscular re-education;Balance training;Functional mobility training;Patient/family education;Vasopneumatic Device;Manual techniques;Passive range of motion   PT Next Visit Plan balance: compliant surface training; shoulder: L shoulder ROM as tolerated   PT Home Exercise Plan balance   Consulted and Agree with Plan of Care Patient      Patient will benefit from skilled therapeutic intervention in order to improve the following deficits and impairments:  Postural dysfunction, Decreased strength, Decreased range of motion, Impaired UE functional use, Decreased balance  Visit Diagnosis: Stiffness of left shoulder, not elsewhere classified  Abnormal posture  Muscle weakness (generalized)     Problem List Patient Active Problem List   Diagnosis Date Noted  . Abnormality of gait 09/07/2016  . History of nephrectomy- Lt 06/16/2014  . Chronic renal impairment, stage 3 (moderate) 06/16/2014  . CAD-RCA/RI PCI in '97 05/15/2013  . Essential hypertension 05/15/2013  . Hyperlipidemia 05/15/2013      Lanney Gins, PT, DPT 11/15/16 12:44  PM    [facility] [address]  [place], [place], [address] Phone: [phone]   Fax:  [phone]  Name: Donald Todd MRN: [id number] Date of Birth: Jun 09, 1929

## 2016-11-18 ENCOUNTER — Ambulatory Visit: Payer: Medicare Other | Attending: Orthopedic Surgery | Admitting: Physical Therapy

## 2016-11-18 ENCOUNTER — Other Ambulatory Visit: Payer: Self-pay | Admitting: Cardiovascular Disease

## 2016-11-18 DIAGNOSIS — R2681 Unsteadiness on feet: Secondary | ICD-10-CM | POA: Insufficient documentation

## 2016-11-18 DIAGNOSIS — R2689 Other abnormalities of gait and mobility: Secondary | ICD-10-CM | POA: Diagnosis not present

## 2016-11-18 NOTE — Telephone Encounter (Signed)
Rx(s) sent to pharmacy electronically.  

## 2016-11-18 NOTE — Therapy (Signed)
[facility] [address]  [place], [place], [address] Phone: [phone]   Fax:  [phone]  Physical Therapy Treatment  Patient Details  Name: Donald Todd MRN: [id number] Date of Birth: [birth year] Referring Provider: Dr Lanny Hurst Willis/Dr  Edelman Swinteck  Encounter Date: 11/18/2016      PT End of Session - 11/18/16 1105    Visit Number 13   Number of Visits 15   Date for PT Re-Evaluation 11/25/16   Authorization Type Medicare/Tricare    Authorization Time Period 09-27-16 - 11-26-16   PT Start Time 1102   PT Stop Time 1132   PT Time Calculation (min) 30 min   Activity Tolerance Patient tolerated treatment well   Behavior During Therapy [facility] for tasks assessed/performed      Past Medical History:  Diagnosis Date  . Arthritis   . CAD (coronary artery disease)    last cath. 07-09-2010  . Cancer [facility])    kidney cancer , prostate cancer   . Chronic kidney disease   . H/O cardiovascular stress test 08-04-2010   ef 55% NO ISCHEMIA  . H/O unilateral nephrectomy    lt. nephrectomy  . Heart attack 1993  . Hyperlipemia   . Hypertension    renal dopplers 218.13-normal patency  . Inguinal hernia 08/01/11   right  . Prostate disease    Cancer S/P seed implant    Past Surgical History:  Procedure Laterality Date  . ANGIOPLASTY  1993  . athroscopic knee surgery  2002   right  . bone scan  2005  . CARDIAC CATHETERIZATION  07-09-2010   normal left ventricular function, coronary obsstructive disease with 20% proximal an 30-40% mid lt. anterior descending narrowing ; widely patent stent in the proximal ramus intermediate vessel; 50-70-% narrowing in the mid circumflex,and widely patent stent  in the rt. coronary   . CARDIAC CATHETERIZATION  12-22-2003   widely patent stents with noncritical coronary artery disease and normalLV function  . CHOLECYSTECTOMY  05/05/2009   Laparoscopic  . colonscopy  2004   with biopsy   . CORONARY ANGIOPLASTY WITH STENT PLACEMENT  03-15-1996   pci to rci and ramus branch using j&j [id number] stent,post dialated  at  20atm (rca)  and 14 atm (ramus branch. ef 55%            . HERNIA REPAIR  08/01/11   RIH  . INGUINAL HERNIA REPAIR Left 03/29/2013   Procedure: HERNIA REPAIR INGUINAL ADULT;  Surgeon: Edward Jolly, MD;  [facility];  Service: General;  Laterality: Left;  . INSERTION OF MESH Left 03/29/2013   Procedure: INSERTION OF MESH;  Surgeon: Edward Jolly, MD;  [facility];  Service: General;  Laterality: Left;  . JOINT REPLACEMENT  2006 and 2010   left 2006, right 2010  . KIDNEY SURGERY  2002 and 2005   2002 - partial removal of left. 2005 complete removal  . PROSTATE BIOPSY    . prostate seed implant    . radiation treatment  2002   prostate  . stent implants  1997    There were no vitals filed for this visit.      Subjective Assessment - 11/18/16 1105    Subjective patient is doing well - ready to be discharged to perform HEP independently   Pertinent History CAD, OA, MI, HTN, HLD, hx prostate and kidney cancer; R shoulder dislocation with very limited motion   Diagnostic tests MRI brain:  cerebral atrophy / MRI shoulder: complete RTC tear   Patient Stated Goals Improve balance, and shoulder   Currently in Pain? No/denies   Pain Score 0-No pain            [facility] PT Assessment - 11/18/16 0001      AROM   Left Shoulder Flexion 51 Degrees   Left Shoulder ABduction 73 Degrees     Ambulation/Gait   Gait velocity 3.2 feet/second     Standardized Balance Assessment   Standardized Balance Assessment Timed Up and Go Test     Timed Up and Go Test   Normal TUG (seconds) 11.06  no device                          Balance Exercises - 11/18/16 1134      Balance Exercises: Standing   Standing Eyes Opened Narrow base of support (BOS);Solid surface;1 rep;30 secs   Tandem Stance Eyes open;1 rep;30 secs   SLS Eyes open;2  reps  continued difficulty without 1 UE support   Cone Rotation Solid surface;Right turn;Left turn  8 cones x 6 trials           PT Education - 11/18/16 1135    Education provided Yes   Education Details review of OTAGO, safe balance tasks (at counter or in corner)   Person(s) Educated Patient   Methods Explanation;Demonstration;Handout   Comprehension Verbalized understanding;Returned demonstration             PT Long Term Goals - 11/18/16 1106      PT LONG TERM GOAL #1   Title independent with HEP for L shoulder (12/05/16)   Status Achieved     PT LONG TERM GOAL #2   Title improve AROM L shoulder to 75 degrees flexion, 90 degrees abduction for improved function (12/05/16)   Status Partially Met  L shoulder flexion - 51; L shoulder abduction 73; all in supine - improved since initial eval     PT LONG TERM GOAL #3   Title demonstrate ability to reach light objects (1-2#) overhead without increase in pain for improved function (12/05/16)   Status Not Met  unable to lift L UE overhead; max forward flexion in supine at 51 degrees     PT LONG TERM GOAL #4   Title n/a     PT LONG TERM GOAL #5   Title n/a     PT LONG TERM GOAL #6   Title n/a     PT LONG TERM GOAL #7   Title Improve TUG score to </= 12.0 secs without device to reduce fall risk.  (11-26-16)   Status Achieved  11.06 seconds     PT LONG TERM GOAL #8   Title Increase gait velocity to >/= 3.2 ft/sec without device for incr. gait efficiency.  (11-26-16)   Status Achieved  3.2 feet/second - no device, no LOB     PT LONG TERM GOAL  #9   TITLE Increase SLS to > 5.0 secs on R and LLE to demo improved balance.  (11-26-16)   Status Partially Met  approx 2-3 seconds wiht no UE support; able to achieve with light 1 UE support     PT LONG TERM GOAL  #10   TITLE Amb. 200' with SPC on uneven terrain with S with pt demonstrating ability to recover LOB to demo improved balance.  (11-26-16)   Status Achieved   patient reporting ability to navigate a  variety of surfaces (grass, snow, gravel) with SPC with no LOB     PT LONG TERM GOAL  #11   TITLE Independent in HEP for balance/vestibular exercises.  (11-26-16)   Status Achieved               Plan - November 21, 2016 1143    Clinical Impression Statement PT session today is last sheduled treatment session for balance training and is set to be discharged on this date with patient in full agreement as he has extensive HEP for balance training including OTAGO as well as balance training with narrow base of support. Reviewed with patient today aspects of OTAGO that he was uncomfortable with, with patient able to perofrm here today with no issue or LOB. Also, discussion regarding use of SPC on uneven surfaces as well as in the community for overall safety with patient demonstrating good understanding of this. Patient has met goals for HEP, TUG, and gait velocity without need for assistive device. Patient and PT agreeing on discharge at this time as patient has progressed well and is able to perform all aspects of HEP independently.    Rehab Potential Good   PT Treatment/Interventions ADLs/Self Care Home Management;Therapeutic activities;Therapeutic exercise;Cryotherapy;Electrical Stimulation;Neuromuscular re-education;Balance training;Functional mobility training;Patient/family education;Vasopneumatic Device;Manual techniques;Passive range of motion   PT Next Visit Plan Discharge on this date   PT Home Exercise Plan balance, shoulder   Consulted and Agree with Plan of Care Patient      Patient will benefit from skilled therapeutic intervention in order to improve the following deficits and impairments:  Postural dysfunction, Decreased strength, Decreased range of motion, Impaired UE functional use, Decreased balance  Visit Diagnosis: Other abnormalities of gait and mobility  Unsteadiness on feet       G-Codes - 11-21-16 1138    Functional Assessment  Tool Used TUG: 11.06 seconds with no device, gait velocity 3.2 seconds with no LOB and no device. Some continued difficulty with SL stance    Functional Limitation Mobility: Walking and moving around   Mobility: Walking and Moving Around Goal Status [phone]) At least 1 percent but less than 20 percent impaired, limited or restricted   Mobility: Walking and Moving Around Discharge Status [phone]) At least 1 percent but less than 20 percent impaired, limited or restricted      Problem List Patient Active Problem List   Diagnosis Date Noted  . Abnormality of gait 09/07/2016  . History of nephrectomy- Lt 06/16/2014  . Chronic renal impairment, stage 3 (moderate) 06/16/2014  . CAD-RCA/RI PCI in '97 05/15/2013  . Essential hypertension 05/15/2013  . Hyperlipidemia 05/15/2013     Lanney Gins, PT, DPT Nov 21, 2016 11:51 AM   PHYSICAL THERAPY DISCHARGE SUMMARY  Visits from Start of Care: 13  Current functional level related to goals / functional outcomes: See above   Remaining deficits: Reduced AROM and strength of shoulder; likely related to full thickness tear. Some difficulty with SL stance goal and requires UE support.    Education / Equipment: HEP, OTAGO  Plan: Patient agrees to discharge.  Patient goals were partially met. Patient is being discharged due to being pleased with the current functional level.  ?????     Lanney Gins, PT, DPT 11/21/2016 11:52 AM   [facility] [address]  [place], [place], [address] Phone: [phone]   Fax:  [phone]  Name: Donald Todd MRN: [id number] Date of Birth: [birth year]

## 2016-11-18 NOTE — Patient Instructions (Signed)
Balance: Eyes Open - Bilateral (Varied Surfaces)    Stand, feet shoulder width, eyes open. Maintain balance __30-60__ seconds. Repeat _3___ times per set.  Repeat on compliant surface: foam.  Stand in corner - or at kitchen counter. If progressing well - add towel or pillow under feet.

## 2016-12-27 ENCOUNTER — Encounter: Payer: Self-pay | Admitting: Cardiovascular Disease

## 2016-12-27 ENCOUNTER — Ambulatory Visit (INDEPENDENT_AMBULATORY_CARE_PROVIDER_SITE_OTHER): Payer: Medicare Other | Admitting: Cardiovascular Disease

## 2016-12-27 VITALS — BP 124/80 | HR 61 | Ht 66.5 in | Wt 169.0 lb

## 2016-12-27 DIAGNOSIS — I10 Essential (primary) hypertension: Secondary | ICD-10-CM

## 2016-12-27 DIAGNOSIS — I2510 Atherosclerotic heart disease of native coronary artery without angina pectoris: Secondary | ICD-10-CM | POA: Diagnosis not present

## 2016-12-27 DIAGNOSIS — E7800 Pure hypercholesterolemia, unspecified: Secondary | ICD-10-CM

## 2016-12-27 NOTE — Assessment & Plan Note (Signed)
History of hyperlipidemia on statin therapy with lipid profile performed by his PCP 09/26/16 revealed total cholesterol 156, LDL 68 and HDL of 61

## 2016-12-27 NOTE — Assessment & Plan Note (Signed)
History of hypertension blood pressure measures at 124/80. He is on amlodipine, enalapril and Toprol. Continue current meds at current dosing

## 2016-12-27 NOTE — Patient Instructions (Signed)

## 2016-12-27 NOTE — Assessment & Plan Note (Signed)
History of CAD status post RCA and ramus branch PCI by myself in 1997. I catheterized him 12/22/2003 revealing patent stents 60% mid LAD lesion and normal LV function. His last Myoview performed 08/04/10 revealed lateral scar and catheterization performed several weeks prior to that revealed patent stents with noncritical CAD. He had a Myoview performed 02/15/13 and again showed lateral scar consistent with his anatomy. He denies chest pain or shortness of breath.

## 2016-12-27 NOTE — Progress Notes (Signed)
12/27/2016 Donald Todd   July 17, 1929  [id number]  Primary Physician Melinda Crutch, MD Primary Cardiologist: Lorretta Harp MD Lupe Carney, [place]  HPI:  The patient returns today for a 62-month followup. He is an 83 year old thin-appearing married Caucasian male, father of 70, grandfather to 7 grandchildren, who I last saw in the office 12/25/15 He has a history of RCA and ramus branch stenting by myself back in 1997. I catheterized him, December 22, 2003, revealing patent stent with a 60% mid LAD lesion, normal LV function. He did have a 40% ostial right renal artery stenosis with a known left nephrectomy. His last Myoview, performed August 04, 2010, revealed lateral scar, and catheterization performed several weeks prior to that revealed patent stents with noncritical CAD. He has remained asymptomatic. His last .Myoview stress test performed 02/15/13 moderate sized lateral wall scar consistent with his anatomy. his most recent lab work performed by his PCP 09/26/16 revealed a total cholesterol of 56, LDL 68 and HDL of 61..   Current Outpatient Prescriptions  Medication Sig Dispense Refill  . amLODipine (NORVASC) 2.5 MG tablet Take 3 tablets (7.5 mg total) by mouth daily. 270 tablet 3  . amoxicillin (AMOXIL) 500 MG capsule Take 2,000 mg by mouth as needed. One hour before dental procedure    . aspirin 81 MG tablet Take 81 mg by mouth daily.     [name] atorvastatin (LIPITOR) 40 MG tablet TAKE 1 TABLET AT BEDTIME 90 tablet 2  . cholecalciferol (VITAMIN D) 1000 units tablet Take 1,000 Units by mouth daily.    . enalapril (VASOTEC) 10 MG tablet Take 1 tablet (10 mg total) by mouth daily. 90 tablet 3  . hydrOXYzine (ATARAX/VISTARIL) 25 MG tablet Take 25 mg by mouth every evening.    . isosorbide mononitrate (IMDUR) 30 MG 24 hr tablet TAKE 3 TABLETS EVERY MORNING AND 1 TABLET EVERY EVENING 360 tablet 1  . loratadine (CLARITIN) 10 MG tablet Take 10 mg by mouth daily.    . Multiple Vitamins-Minerals  (OCUVITE PO) Take 1 capsule by mouth 2 (two) times daily.     . nitroGLYCERIN (NITROSTAT) 0.4 MG SL tablet Place 1 tablet (0.4 mg total) under the tongue as needed. 25 tablet 3  . omeprazole (PRILOSEC) 20 MG capsule TAKE 1 CAPSULE DAILY 90 capsule 3  . Probiotic Product (PROBIOTIC PO) Take by mouth daily.    . TOPROL XL 25 MG 24 hr tablet TAKE 1 TABLET TWICE A DAY WITH OR IMMEDIATELY FOLLOWING A MEAL 180 tablet 3   No current facility-administered medications for this visit.     Allergies  Allergen Reactions  . Tape Hives  . Iohexol      Desc: PT DOES RECALL REACTION JUST SAYS IT WAS A LONG TIME AGO FOR KIDNEY X-RAYS AND HE DIDN'T TOLERATE IT WELL-ARS 05/02/09-NOTE E-CHART STATES A HOT FEELING IS HIS REACTION, BUT HE CAN'T CONFIRM THAT WAS ALL   . Oxycodone Other (See Comments)    Shaking uncontrollably     Social History   Social History  . Marital status: Married    Spouse name: N/A  . Number of children: 5  . Years of education: 12   Occupational History  . Retired     Firefighter, Marine scientist   Social History Main Topics  . Smoking status: Former Smoker    Types: Cigarettes, Cigars    Quit date: 12/14/1963  . Smokeless tobacco: Never Used  . Alcohol use No  . Drug  use: No  . Sexual activity: Not on file   Other Topics Concern  . Not on file   Social History Narrative   Lives at home w/ his wife   Right-handed   Caffeine: 2 cups of coffee per day, rare soft drink     Review of Systems: General: negative for chills, fever, night sweats or weight changes.  Cardiovascular: negative for chest pain, dyspnea on exertion, edema, orthopnea, palpitations, paroxysmal nocturnal dyspnea or shortness of breath Dermatological: negative for rash Respiratory: negative for cough or wheezing Urologic: negative for hematuria Abdominal: negative for nausea, vomiting, diarrhea, bright red blood per rectum, melena, or hematemesis Neurologic: negative for visual changes, syncope, or  dizziness All other systems reviewed and are otherwise negative except as noted above.    Blood pressure 124/80, pulse 61, height 5' 6.5" (1.689 m), weight 169 lb (76.7 kg).  General appearance: alert and no distress Neck: no adenopathy, no carotid bruit, no JVD, supple, symmetrical, trachea midline and thyroid not enlarged, symmetric, no tenderness/mass/nodules Lungs: clear to auscultation bilaterally Heart: regular rate and rhythm, S1, S2 normal, no murmur, click, rub or gallop Extremities: extremities normal, atraumatic, no cyanosis or edema  EKG sinus rhythm at 61 with left anterior fascicular block and reverse R-wave progression. Pursue reviewed this EKG  ASSESSMENT AND PLAN:   CAD-RCA/RI PCI in '97 History of CAD status post RCA and ramus branch PCI by myself in 1997. I catheterized him 12/22/2003 revealing patent stents 60% mid LAD lesion and normal LV function. His last Myoview performed 08/04/10 revealed lateral scar and catheterization performed several weeks prior to that revealed patent stents with noncritical CAD. He had a Myoview performed 02/15/13 and again showed lateral scar consistent with his anatomy. He denies chest pain or shortness of breath.  Essential hypertension History of hypertension blood pressure measures at 124/80. He is on amlodipine, enalapril and Toprol. Continue current meds at current dosing  Hyperlipidemia History of hyperlipidemia on statin therapy with lipid profile performed by his PCP 09/26/16 revealed total cholesterol 156, LDL 68 and HDL of Stanley MD [facility] 12/27/2016 11:27 AM

## 2017-01-05 ENCOUNTER — Encounter: Payer: Self-pay | Admitting: Neurology

## 2017-01-05 ENCOUNTER — Ambulatory Visit (INDEPENDENT_AMBULATORY_CARE_PROVIDER_SITE_OTHER): Payer: Medicare Other | Admitting: Neurology

## 2017-01-05 VITALS — BP 140/79 | HR 68 | Ht 66.5 in | Wt 170.0 lb

## 2017-01-05 DIAGNOSIS — I2510 Atherosclerotic heart disease of native coronary artery without angina pectoris: Secondary | ICD-10-CM

## 2017-01-05 DIAGNOSIS — R269 Unspecified abnormalities of gait and mobility: Secondary | ICD-10-CM | POA: Diagnosis not present

## 2017-01-05 NOTE — Progress Notes (Signed)
Reason for visit: Gait disorder  Donald Todd is an 83 y.o. male  History of present illness:  Donald Todd is an 83 year old right-handed white male with a history of a mild gait disorder. The patient has undergone MRI of the brain that shows mild to moderate small vessel ischemic changes. The patient has been sent through physical therapy which she believes was quite beneficial. The patient has done better with his safety, he has not had any falls since last seen. When he is outside of the house, he may use a cane for ambulation. The patient claims that on average once a week, he may within the first hour or so developed a mild bifrontal headache and some dizziness. The patient claims he takes Imdur, he takes 90 mg as soon as he wakes up in the morning before he eats breakfast. The patient is also on 3 other medications that may affect blood pressure. Occasionally, he may wake up in the night or in the morning and see a person in the room or a window on the wall that is not there, and within a few moments it is gone. The episodes such as this only occur coming out of sleep. The patient returns to this office for an evaluation.  Past Medical History:  Diagnosis Date  . Arthritis   . CAD (coronary artery disease)    last cath. 07-09-2010  . Cancer [facility])    kidney cancer , prostate cancer   . Chronic kidney disease   . H/O cardiovascular stress test 08-04-2010   ef 55% NO ISCHEMIA  . H/O unilateral nephrectomy    lt. nephrectomy  . Heart attack 1993  . Hyperlipemia   . Hypertension    renal dopplers 218.13-normal patency  . Inguinal hernia 08/01/11   right  . Prostate disease    Cancer S/P seed implant    Past Surgical History:  Procedure Laterality Date  . ANGIOPLASTY  1993  . athroscopic knee surgery  2002   right  . bone scan  2005  . CARDIAC CATHETERIZATION  07-09-2010   normal left ventricular function, coronary obsstructive disease with 20% proximal an 30-40% mid lt.  anterior descending narrowing ; widely patent stent in the proximal ramus intermediate vessel; 50-70-% narrowing in the mid circumflex,and widely patent stent  in the rt. coronary   . CARDIAC CATHETERIZATION  12-22-2003   widely patent stents with noncritical coronary artery disease and normalLV function  . CHOLECYSTECTOMY  05/05/2009   Laparoscopic  . colonscopy  2004   with biopsy  . CORONARY ANGIOPLASTY WITH STENT PLACEMENT  03-15-1996   pci to rci and ramus branch using j&j [id number] stent,post dialated  at  20atm (rca)  and 14 atm (ramus branch. ef 55%            . HERNIA REPAIR  08/01/11   RIH  . INGUINAL HERNIA REPAIR Left 03/29/2013   Procedure: HERNIA REPAIR INGUINAL ADULT;  Surgeon: Edward Jolly, MD;  Location: [facility] ORS;  Service: General;  Laterality: Left;  . INSERTION OF MESH Left 03/29/2013   Procedure: INSERTION OF MESH;  Surgeon: Edward Jolly, MD;  Location: [facility]S;  Service: General;  Laterality: Left;  . JOINT REPLACEMENT  2006 and 2010   left 2006, right 2010  . KIDNEY SURGERY  2002 and 2005   2002 - partial removal of left. 2005 complete removal  . PROSTATE BIOPSY    . prostate seed implant    .  radiation treatment  2002   prostate  . stent implants  1997    Family History  Problem Relation Age of Onset  . Cancer Mother     stomach?  . Stroke Father   . Stroke Brother     Social history:  reports that he quit smoking about 53 years ago. His smoking use included Cigarettes and Cigars. He has never used smokeless tobacco. He reports that he does not drink alcohol or use drugs.    Allergies  Allergen Reactions  . Tape Hives  . Iohexol      Desc: PT DOES RECALL REACTION JUST SAYS IT WAS A LONG TIME AGO FOR KIDNEY X-RAYS AND HE DIDN'T TOLERATE IT WELL-ARS 05/02/09-NOTE E-CHART STATES A HOT FEELING IS HIS REACTION, BUT HE CAN'T CONFIRM THAT WAS ALL   . Oxycodone Other (See Comments)    Shaking uncontrollably     Medications:  Prior to Admission  medications   Medication Sig Start Date End Date Taking? Authorizing Provider  amLODipine (NORVASC) 2.5 MG tablet Take 3 tablets (7.5 mg total) by mouth daily. 02/08/16  Yes Lorretta Harp, MD  amoxicillin (AMOXIL) 500 MG capsule Take 2,000 mg by mouth as needed. One hour before dental procedure   Yes Historical Provider, MD  aspirin 81 MG tablet Take 81 mg by mouth daily.    Yes Historical Provider, MD  atorvastatin (LIPITOR) 40 MG tablet TAKE 1 TABLET AT BEDTIME 11/18/16  Yes Lorretta Harp, MD  cholecalciferol (VITAMIN D) 1000 units tablet Take 1,000 Units by mouth daily.   Yes Historical Provider, MD  enalapril (VASOTEC) 10 MG tablet Take 1 tablet (10 mg total) by mouth daily. 02/08/16  Yes Lorretta Harp, MD  hydrOXYzine (ATARAX/VISTARIL) 25 MG tablet Take 25 mg by mouth every evening.   Yes Historical Provider, MD  isosorbide mononitrate (IMDUR) 30 MG 24 hr tablet TAKE 3 TABLETS EVERY MORNING AND 1 TABLET EVERY EVENING 06/14/16  Yes Lorretta Harp, MD  loratadine (CLARITIN) 10 MG tablet Take 10 mg by mouth daily.   Yes Historical Provider, MD  Multiple Vitamins-Minerals (OCUVITE PO) Take 1 capsule by mouth 2 (two) times daily.    Yes Historical Provider, MD  nitroGLYCERIN (NITROSTAT) 0.4 MG SL tablet Place 1 tablet (0.4 mg total) under the tongue as needed. 12/17/14  Yes Lorretta Harp, MD  omeprazole (PRILOSEC) 20 MG capsule TAKE 1 CAPSULE DAILY 01/14/16  Yes Lorretta Harp, MD  Probiotic Product (PROBIOTIC PO) Take by mouth daily.   Yes Historical Provider, MD  TOPROL XL 25 MG 24 hr tablet TAKE 1 TABLET TWICE A DAY WITH OR IMMEDIATELY FOLLOWING A MEAL 02/08/16  Yes Lorretta Harp, MD    ROS:  Out of a complete 14 system review of symptoms, the patient complains only of the following symptoms, and all other reviewed systems are negative.  Dizziness, headache Daytime sleepiness, sleep talking Itching  Blood pressure 140/79, pulse 68, height 5' 6.5" (1.689 m), weight 170 lb (77.1  kg).   Blood pressure, standing, right arm is 128/60.  Physical Exam  General: The patient is alert and cooperative at the time of the examination.  Skin: No significant peripheral edema is noted.   Neurologic Exam  Mental status: The patient is alert and oriented x 3 at the time of the examination. The patient has apparent normal recent and remote memory, with an apparently normal attention span and concentration ability.   Cranial nerves: Facial symmetry is present. Speech  is normal, no aphasia or dysarthria is noted. Extraocular movements are full. Visual fields are full.  Motor: The patient has good strength in all 4 extremities.  Sensory examination: Soft touch sensation is symmetric on the face, arms, and legs.  Coordination: The patient has good finger-nose-finger and heel-to-shin bilaterally.  Gait and station: The patient has a normal gait. Tandem gait is unsteady. Romberg is negative. No drift is seen.  Reflexes: Deep tendon reflexes are symmetric.   MRI brain 09/30/16:  IMPRESSION: Abnormal MRI scan of the brain showing age-related changes of chronic microvascular ischemia and generalized cerebral atrophy. Incidental mild changes of chronic paranasal sinusitis are noted as well.  * MRI scan images were reviewed online. I agree with the written report.    Assessment/Plan:  1. Gait disturbance  The patient is doing better with safety with walking. The patient does report episodes of dizziness and headache that seemed to occur after the morning dose of Imdur, this medication is likely causing these side effects. I suppose it is possible that changing the dosing schedule may offer some benefit with these symptoms. The patient may need to talk with Dr. Gwenlyn Found concerning this. The patient will follow-up through this office on an as-needed basis. The episodes of "hallucinations" upon awakening from sleep are likely a REM sleep phenomenon. I would not pursue any further  workup regarding this or offer any treatment for these events.  Jill Alexanders MD 01/05/2017 9:07 AM  [facility] [address] [place], [place] [address]  Phone [phone] Fax [phone]

## 2017-01-05 NOTE — Patient Instructions (Addendum)
Fall Prevention in the Home Falls can cause injuries and can affect people from all age groups. There are many simple things that you can do to make your home safe and to help prevent falls. What can I do on the outside of my home?  Regularly repair the edges of walkways and driveways and fix any cracks.  Remove high doorway thresholds.  Trim any shrubbery on the main path into your home.  Use bright outdoor lighting.  Clear walkways of debris and clutter, including tools and rocks.  Regularly check that handrails are securely fastened and in good repair. Both sides of any steps should have handrails.  Install guardrails along the edges of any raised decks or porches.  Have leaves, snow, and ice cleared regularly.  Use sand or salt on walkways during winter months.  In the garage, clean up any spills right away, including grease or oil spills. What can I do in the bathroom?  Use night lights.  Install grab bars by the toilet and in the tub and shower. Do not use towel bars as grab bars.  Use non-skid mats or decals on the floor of the tub or shower.  If you need to sit down while you are in the shower, use a plastic, non-slip stool.  Keep the floor dry. Immediately clean up any water that spills on the floor.  Remove soap buildup in the tub or shower on a regular basis.  Attach bath mats securely with double-sided non-slip rug tape.  Remove throw rugs and other tripping hazards from the floor. What can I do in the bedroom?  Use night lights.  Make sure that a bedside light is easy to reach.  Do not use oversized bedding that drapes onto the floor.  Have a firm chair that has side arms to use for getting dressed.  Remove throw rugs and other tripping hazards from the floor. What can I do in the kitchen?  Clean up any spills right away.  Avoid walking on wet floors.  Place frequently used items in easy-to-reach places.  If you need to reach for something above  you, use a sturdy step stool that has a grab bar.  Keep electrical cables out of the way.  Do not use floor polish or wax that makes floors slippery. If you have to use wax, make sure that it is non-skid floor wax.  Remove throw rugs and other tripping hazards from the floor. What can I do in the stairways?  Do not leave any items on the stairs.  Make sure that there are handrails on both sides of the stairs. Fix handrails that are broken or loose. Make sure that handrails are as long as the stairways.  Check any carpeting to make sure that it is firmly attached to the stairs. Fix any carpet that is loose or worn.  Avoid having throw rugs at the top or bottom of stairways, or secure the rugs with carpet tape to prevent them from moving.  Make sure that you have a light switch at the top of the stairs and the bottom of the stairs. If you do not have them, have them installed. What are some other fall prevention tips?  Wear closed-toe shoes that fit well and support your feet. Wear shoes that have rubber soles or low heels.  When you use a stepladder, make sure that it is completely opened and that the sides are firmly locked. Have someone hold the ladder while you are using   it. Do not climb a closed stepladder.  Add color or contrast paint or tape to grab bars and handrails in your home. Place contrasting color strips on the first and last steps.  Use mobility aids as needed, such as canes, walkers, scooters, and crutches.  Turn on lights if it is dark. Replace any light bulbs that burn out.  Set up furniture so that there are clear paths. Keep the furniture in the same spot.  Fix any uneven floor surfaces.  Choose a carpet design that does not hide the edge of steps of a stairway.  Be aware of any and all pets.  Review your medicines with your healthcare provider. Some medicines can cause dizziness or changes in blood pressure, which increase your risk of falling. Talk with  your health care provider about other ways that you can decrease your risk of falls. This may include working with a physical therapist or trainer to improve your strength, balance, and endurance. This information is not intended to replace advice given to you by your health care provider. Make sure you discuss any questions you have with your health care provider. Document Released: 11/11/2002 Document Revised: 04/19/2016 Document Reviewed: 12/26/2014 Elsevier Interactive Patient Education  2017 [employer] Inc[employer]  

## 2017-01-08 ENCOUNTER — Other Ambulatory Visit: Payer: Self-pay | Admitting: Cardiovascular Disease

## 2017-01-09 NOTE — Telephone Encounter (Signed)
Rx(s) sent to pharmacy electronically.  

## 2017-02-02 ENCOUNTER — Other Ambulatory Visit: Payer: Self-pay | Admitting: Cardiovascular Disease

## 2017-02-03 NOTE — Telephone Encounter (Signed)
Rx(s) sent to pharmacy electronically.  

## 2017-02-28 DIAGNOSIS — H353230 Exudative age-related macular degeneration, bilateral, stage unspecified: Secondary | ICD-10-CM | POA: Diagnosis not present

## 2017-02-28 DIAGNOSIS — Z961 Presence of intraocular lens: Secondary | ICD-10-CM | POA: Diagnosis not present

## 2017-03-14 DIAGNOSIS — S01112A Laceration without foreign body of left eyelid and periocular area, initial encounter: Secondary | ICD-10-CM | POA: Diagnosis not present

## 2017-03-14 DIAGNOSIS — S60511A Abrasion of right hand, initial encounter: Secondary | ICD-10-CM | POA: Diagnosis not present

## 2017-03-14 DIAGNOSIS — G4489 Other headache syndrome: Secondary | ICD-10-CM | POA: Diagnosis not present

## 2017-03-14 DIAGNOSIS — Z7982 Long term (current) use of aspirin: Secondary | ICD-10-CM | POA: Diagnosis not present

## 2017-03-14 DIAGNOSIS — S0081XA Abrasion of other part of head, initial encounter: Secondary | ICD-10-CM | POA: Diagnosis not present

## 2017-03-14 DIAGNOSIS — S199XXA Unspecified injury of neck, initial encounter: Secondary | ICD-10-CM | POA: Diagnosis not present

## 2017-03-14 DIAGNOSIS — S0990XA Unspecified injury of head, initial encounter: Secondary | ICD-10-CM | POA: Diagnosis not present

## 2017-03-14 DIAGNOSIS — S0181XA Laceration without foreign body of other part of head, initial encounter: Secondary | ICD-10-CM | POA: Diagnosis not present

## 2017-03-14 DIAGNOSIS — M542 Cervicalgia: Secondary | ICD-10-CM | POA: Diagnosis not present

## 2017-03-14 DIAGNOSIS — S0083XA Contusion of other part of head, initial encounter: Secondary | ICD-10-CM | POA: Diagnosis not present

## 2017-03-21 ENCOUNTER — Telehealth: Payer: Self-pay | Admitting: Cardiovascular Disease

## 2017-03-21 NOTE — Telephone Encounter (Signed)
New message    Pt wife is calling about pt and his medication. She says that pt is very lightheaded. She says his medication might need to be changed. She says that pt has no other symptoms.

## 2017-03-21 NOTE — Telephone Encounter (Signed)
Returned the phone call to the patient and the patient's wife. He stated that he has been lightheaded for a while and recently had a fall. He stated that he has been light headed when he is ambulating and at rest. He has no other symptoms. He has not been checking his blood pressure or heart rate. He was informed that it would be beneficial to have the blood pressure readings. He stated that he would start taking it tonight and in the morning and call back with those readings.

## 2017-03-22 NOTE — Telephone Encounter (Signed)
Returned the phone call to the patient to check on his readings and to see how he felt. He gave the following readings:  10pm (last night) 150/75 HR 61 Midnight 151/76 HR 57  Pre medication this morning: 165/83 HR 57 After medication: 172/73 HR 55  He did state that he felt "good" today and was no longer light headed. He will continue to take his blood pressure and bring the log in at his next visit. He was informed to call if he felt light headed again. He verbalized his understanding.

## 2017-03-28 DIAGNOSIS — H353232 Exudative age-related macular degeneration, bilateral, with inactive choroidal neovascularization: Secondary | ICD-10-CM | POA: Diagnosis not present

## 2017-03-28 DIAGNOSIS — H35351 Cystoid macular degeneration, right eye: Secondary | ICD-10-CM | POA: Diagnosis not present

## 2017-03-28 DIAGNOSIS — Z961 Presence of intraocular lens: Secondary | ICD-10-CM | POA: Diagnosis not present

## 2017-04-03 ENCOUNTER — Ambulatory Visit (INDEPENDENT_AMBULATORY_CARE_PROVIDER_SITE_OTHER): Payer: Medicare Other | Admitting: Cardiology

## 2017-04-03 ENCOUNTER — Encounter: Payer: Self-pay | Admitting: Cardiology

## 2017-04-03 VITALS — BP 132/72 | HR 60 | Ht 66.5 in | Wt 169.2 lb

## 2017-04-03 DIAGNOSIS — R269 Unspecified abnormalities of gait and mobility: Secondary | ICD-10-CM

## 2017-04-03 DIAGNOSIS — E785 Hyperlipidemia, unspecified: Secondary | ICD-10-CM

## 2017-04-03 DIAGNOSIS — I10 Essential (primary) hypertension: Secondary | ICD-10-CM

## 2017-04-03 DIAGNOSIS — R55 Syncope and collapse: Secondary | ICD-10-CM

## 2017-04-03 DIAGNOSIS — N183 Chronic kidney disease, stage 3 unspecified: Secondary | ICD-10-CM

## 2017-04-03 DIAGNOSIS — I2510 Atherosclerotic heart disease of native coronary artery without angina pectoris: Secondary | ICD-10-CM | POA: Diagnosis not present

## 2017-04-03 DIAGNOSIS — Z9861 Coronary angioplasty status: Secondary | ICD-10-CM | POA: Diagnosis not present

## 2017-04-03 MED ORDER — ENALAPRIL MALEATE 10 MG PO TABS: 10.0000 mg | ORAL_TABLET | Freq: Every day | ORAL | 1 refills | Status: DC

## 2017-04-03 MED ORDER — AMLODIPINE BESYLATE 2.5 MG PO TABS: 5.0000 mg | ORAL_TABLET | ORAL | 1 refills | Status: DC

## 2017-04-03 MED ORDER — METOPROLOL SUCCINATE ER 25 MG PO TB24: 25.0000 mg | ORAL_TABLET | ORAL | 1 refills | Status: DC

## 2017-04-03 MED ORDER — ISOSORBIDE MONONITRATE ER 30 MG PO TB24: 60.0000 mg | ORAL_TABLET | ORAL | 1 refills | Status: DC

## 2017-04-03 NOTE — Patient Instructions (Addendum)
Medication Instructions:   Make the following changes to how you're taking your medications:   -REDUCE Amlodipine from 7.5mg  (3 tablets) to 5mg  (2 tablets). Take this in the MORNING.  -REDUCE Imdur from 90mg  in AM and 30mg  in PM to 60mg  in AM. ONLY take this medication in the morning.  -Take your Vasotec ONCE DAILY IN THE EVENING instead of morning.  -TAKE Toprol ONCE DAILY in the AM instead of twice daily.   Labwork: none   Testing/Procedures: none   Follow-Up: with Dr. Gwenlyn Found in 3-4 weeks.   If you need a refill on your cardiac medications before your next appointment, please call your pharmacy.

## 2017-04-03 NOTE — Assessment & Plan Note (Signed)
MRI-CSVD. Cane and PT has helped 

## 2017-04-03 NOTE — Assessment & Plan Note (Signed)
Controlled.  

## 2017-04-03 NOTE — Assessment & Plan Note (Signed)
Pt had a syncopal spell probably related to hypotension or possibly bradycardia

## 2017-04-03 NOTE — Assessment & Plan Note (Signed)
Followed by PCP

## 2017-04-03 NOTE — Progress Notes (Signed)
04/03/2017 Donald Todd   Jul 14, 1929  [id number]  Primary Physician Donald Crutch, MD Primary Cardiologist: Dr Donald Todd  HPI:  83 year old male, followed by Dr Donald Todd. 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 He has a history of a remote RCA and ramus branch stenting back in 1997. His last cath was in August 2011 and both sites were patent with 30% LAD, and 60% CFX. He has a history of a  left nephrectomy and has stage 3 CRI, his last SCr was 1.44 in April 2014, this has been followed by Dr Donald Todd. The pt also recently saw Dr Donald Todd for gait issues. An MRI showed chronic small vessel disease. There was some concern some of his symptoms may be from his medications, specifically Imdur. The pt tells me he was working in his yard last week and bent over. He thinks he blacked out, falling forward and breaking his glasses and lacerating his forehead. He has not had any recurrent episodes but has noticed some dizziness when standing in the morning after taking his medications. He denies any chest pain.     Current Outpatient Prescriptions  Medication Sig Dispense Refill  . amLODipine (NORVASC) 2.5 MG tablet Take 2 tablets (5 mg total) by mouth every morning. 180 tablet 1  . amoxicillin (AMOXIL) 500 MG capsule Take 2,000 mg by mouth as needed. One hour before dental procedure    . aspirin 81 MG tablet Take 81 mg by mouth daily.     [name] atorvastatin (LIPITOR) 40 MG tablet TAKE 1 TABLET AT BEDTIME 90 tablet 2  . cholecalciferol (VITAMIN D) 1000 units tablet Take 1,000 Units by mouth daily.    . enalapril (VASOTEC) 10 MG tablet Take 1 tablet (10 mg total) by mouth at bedtime. 90 tablet 1  . Ergocalciferol (VITAMIN D2) 2000 units TABS Take 1 tablet by mouth daily.    . hydrOXYzine (ATARAX/VISTARIL) 25 MG tablet Take 25 mg by mouth every evening.    . isosorbide mononitrate (IMDUR) 30 MG 24 hr tablet Take 2 tablets (60 mg total) by mouth every morning. 360 tablet 1  . loratadine (CLARITIN) 10 MG tablet Take 10  mg by mouth daily.    . metoprolol succinate (TOPROL XL) 25 MG 24 hr tablet Take 1 tablet (25 mg total) by mouth every morning. 90 tablet 1  . Multiple Vitamins-Minerals (OCUVITE PO) Take 1 capsule by mouth 2 (two) times daily.     . nitroGLYCERIN (NITROSTAT) 0.4 MG SL tablet Place 1 tablet (0.4 mg total) under the tongue as needed. 25 tablet 3  . omeprazole (PRILOSEC) 20 MG capsule TAKE 1 CAPSULE DAILY 90 capsule 3  . Probiotic Product (PROBIOTIC PO) Take by mouth daily.     No current facility-administered medications for this visit.     Allergies  Allergen Reactions  . Tape Hives  . Hydrocodone   . Iohexol      Desc: PT DOES RECALL REACTION JUST SAYS IT WAS A LONG TIME AGO FOR KIDNEY X-RAYS AND HE DIDN'T TOLERATE IT WELL-ARS 05/02/09-NOTE E-CHART STATES A HOT FEELING IS HIS REACTION, BUT HE CAN'T CONFIRM THAT WAS ALL   . Oxycodone Other (See Comments)    Shaking uncontrollably     Past Medical History:  Diagnosis Date  . Arthritis   . CAD (coronary artery disease)    last cath. 07-09-2010  . Cancer [facility])    kidney cancer , prostate cancer   . Chronic kidney disease   . H/O cardiovascular  stress test 08-04-2010   ef 55% NO ISCHEMIA  . H/O unilateral nephrectomy    lt. nephrectomy  . Heart attack ([place]) 1993  . Hyperlipemia   . Hypertension    renal dopplers 218.13-normal patency  . Inguinal hernia 08/01/11   right  . Prostate disease    Cancer S/P seed implant    Social History   Social History  . Marital status: Married    Spouse name: N/A  . Number of children: 5  . Years of education: 12   Occupational History  . Retired     Firefighter, Marine scientist   Social History Main Topics  . Smoking status: Former Smoker    Types: Cigarettes, Cigars    Quit date: 12/14/1963  . Smokeless tobacco: Never Used  . Alcohol use No  . Drug use: No  . Sexual activity: Not on file   Other Topics Concern  . Not on file   Social History Narrative   Lives at home w/ his wife    Right-handed   Caffeine: 2 cups of coffee per day, rare soft drink     Family History  Problem Relation Age of Onset  . Cancer Mother     stomach?  . Stroke Father   . Stroke Brother      Review of Systems: General: negative for chills, fever, night sweats or weight changes.  Cardiovascular: negative for chest pain, dyspnea on exertion, edema, orthopnea, palpitations, paroxysmal nocturnal dyspnea or shortness of breath Dermatological: negative for rash Respiratory: negative for cough or wheezing Urologic: negative for hematuria Abdominal: negative for nausea, vomiting, diarrhea, bright red blood per rectum, melena, or hematemesis Neurologic: negative for visual changes He has significant Lt shoulder problems and may need surgery All other systems reviewed and are otherwise negative except as noted above.    Blood pressure 132/72, pulse 60, height 5' 6.5" (1.689 m), weight 169 lb 3.2 oz (76.7 kg).  General appearance: alert, cooperative and no distress Neck: no carotid bruit and no JVD Lungs: clear to auscultation bilaterally Heart: regular rate and rhythm Extremities: no edema Skin: Skin color, texture, turgor normal. No rashes or lesions Neurologic: Grossly normal  EKG NSR-60  ASSESSMENT AND PLAN:   Chronic renal impairment, stage 3 (moderate) History of partial nephrectomy  Syncope and collapse Pt had a syncopal spell probably related to hypotension or possibly bradycardia  CAD S/P percutaneous coronary angioplasty History of CAD- s/p remote RCA and RI PCI in the 90's. Cath in 2011 showed patent stents with 30-40% LAD, 60% CFX with normal LVF Myoview March 2014 showed lateral scar, no ischemia, low risk.  He has had chest pain in the past but not recently  Essential hypertension Controlled  Dyslipidemia Followed by PCP  Abnormality of gait MRI-CSVD. Cane and PT has helped   PLAN  I made several adjustments to Donald Todd medications- I cut his Torprol to 25  mg daily, his Norvasc to 5 mg daily, and his Imdur to 60 mg daily. I asked him to take his Vasotec Q HS. We'll see him back in a few weeks, if he is doing OK he should be OK to have shoulder surgery without further cardiac evaluation but will run that past Dr Donald Todd as well.   [name] PA-C 04/03/2017 4:24 PM

## 2017-04-03 NOTE — Assessment & Plan Note (Signed)
History of CAD- s/p remote RCA and RI PCI in the 90's. Cath in 2011 showed patent stents with 30-40% LAD, 60% CFX with normal LVF Myoview March 2014 showed lateral scar, no ischemia, low risk.  He has had chest pain in the past but not recently

## 2017-04-03 NOTE — Assessment & Plan Note (Signed)
History of partial nephrectomy

## 2017-04-13 DIAGNOSIS — M75122 Complete rotator cuff tear or rupture of left shoulder, not specified as traumatic: Secondary | ICD-10-CM | POA: Diagnosis not present

## 2017-04-17 ENCOUNTER — Telehealth: Payer: Self-pay | Admitting: Cardiovascular Disease

## 2017-04-17 NOTE — Telephone Encounter (Signed)
Requesting surgical clearance:  1. Type of surgery: Left Shoulder: left reverse TSA  2. Surgeon: Esmond Plants, MD   3.Surgical Date:  pending  4. Medications that need to be held: ASA 81--7 days prior   5. CAD: Yes  6. I will defer to:  Dr. Pearla Dubonnet Information:  [facility] Phone:  [phone] Fax:  [phone]

## 2017-04-17 NOTE — Telephone Encounter (Signed)
OK to hold ASA. Cleared for shoulder surg at low risk

## 2017-04-17 NOTE — Telephone Encounter (Signed)
Clearance routed to number provided via EPIC. 

## 2017-05-03 ENCOUNTER — Encounter: Payer: Self-pay | Admitting: Cardiovascular Disease

## 2017-05-03 ENCOUNTER — Ambulatory Visit (INDEPENDENT_AMBULATORY_CARE_PROVIDER_SITE_OTHER): Payer: Medicare Other | Admitting: Cardiovascular Disease

## 2017-05-03 DIAGNOSIS — I2510 Atherosclerotic heart disease of native coronary artery without angina pectoris: Secondary | ICD-10-CM

## 2017-05-03 DIAGNOSIS — R55 Syncope and collapse: Secondary | ICD-10-CM | POA: Diagnosis not present

## 2017-05-03 DIAGNOSIS — Z9861 Coronary angioplasty status: Secondary | ICD-10-CM | POA: Diagnosis not present

## 2017-05-03 DIAGNOSIS — E785 Hyperlipidemia, unspecified: Secondary | ICD-10-CM | POA: Diagnosis not present

## 2017-05-03 DIAGNOSIS — I10 Essential (primary) hypertension: Secondary | ICD-10-CM

## 2017-05-03 NOTE — Assessment & Plan Note (Signed)
Recent episode of syncope for unclear reasons. Blood pressure is normal. He is chronically dizzy. He had a negative brain MRI apparently. I suggested that he see an ENT doctor for further evaluation.

## 2017-05-03 NOTE — Progress Notes (Signed)
05/03/2017 Donald Todd   February 07, 1929  [id number]  Primary Physician [name], MD Primary Cardiologist: Lorretta Harp MD [name]  HPI:   Donald Todd is an 83 year old thin-appearing married Caucasian male, father of 6, grandfather to 7 grandchildren, who I last saw in the office 12/27/16. He has a history of RCA and ramus branch stenting by myself back in 1997. I catheterized him, December 22, 2003, revealing patent stent with a 60% mid LAD lesion, normal LV function. He did have a 40% ostial right renal artery stenosis with a known left nephrectomy. His last Myoview, performed August 04, 2010, revealed lateral scar, and catheterization performed several weeks prior to that revealed patent stents with noncritical CAD. He has remained asymptomatic. His last .Myoview stress test performed 02/15/13 moderate sized lateral wall scar consistent with his anatomy. He saw [name] in the office 04/03/17 after a syncopal episode that occurred while he was bending over and running up. He's had a workup including an MRI of his brain which is unremarkable. He did injure his left shoulder and apparently will require surgery on this.   Current Outpatient Prescriptions  Medication Sig Dispense Refill  . amLODipine (NORVASC) 2.5 MG tablet Take 2 tablets (5 mg total) by mouth every morning. 180 tablet 1  . amoxicillin (AMOXIL) 500 MG capsule Take 2,000 mg by mouth as needed. One hour before dental procedure    . aspirin 81 MG tablet Take 81 mg by mouth daily.     [name] atorvastatin (LIPITOR) 40 MG tablet TAKE 1 TABLET AT BEDTIME 90 tablet 2  . cholecalciferol (VITAMIN D) 1000 units tablet Take 1,000 Units by mouth daily.    . enalapril (VASOTEC) 10 MG tablet Take 1 tablet (10 mg total) by mouth at bedtime. 90 tablet 1  . Ergocalciferol (VITAMIN D2) 2000 units TABS Take 1 tablet by mouth daily.    . hydrOXYzine (ATARAX/VISTARIL) 25 MG tablet Take 25 mg by mouth every evening.    .  isosorbide mononitrate (IMDUR) 30 MG 24 hr tablet Take 2 tablets (60 mg total) by mouth every morning. 360 tablet 1  . loratadine (CLARITIN) 10 MG tablet Take 10 mg by mouth daily.    . metoprolol succinate (TOPROL XL) 25 MG 24 hr tablet Take 1 tablet (25 mg total) by mouth every morning. 90 tablet 1  . Multiple Vitamins-Minerals (OCUVITE PO) Take 1 capsule by mouth 2 (two) times daily.     . nitroGLYCERIN (NITROSTAT) 0.4 MG SL tablet Place 1 tablet (0.4 mg total) under the tongue as needed. 25 tablet 3  . omeprazole (PRILOSEC) 20 MG capsule TAKE 1 CAPSULE DAILY 90 capsule 3  . Probiotic Product (PROBIOTIC PO) Take by mouth daily.     No current facility-administered medications for this visit.     Allergies  Allergen Reactions  . Tape Hives  . Hydrocodone   . Iohexol      Desc: PT DOES RECALL REACTION JUST SAYS IT WAS A LONG TIME AGO FOR KIDNEY X-RAYS AND HE DIDN'T TOLERATE IT WELL-ARS 05/02/09-NOTE E-CHART STATES A HOT FEELING IS HIS REACTION, BUT HE CAN'T CONFIRM THAT WAS ALL   . Oxycodone Other (See Comments)    Shaking uncontrollably     Social History   Social History  . Marital status: Married    Spouse name: N/A  . Number of children: 5  . Years of education: 12   Occupational History  . Retired  [place], [place]   Social History Main Topics  . Smoking status: Former Smoker    Types: Cigarettes, Cigars    Quit date: 12/14/1963  . Smokeless tobacco: Never Used  . Alcohol use No  . Drug use: No  . Sexual activity: Not on file   Other Topics Concern  . Not on file   Social History Narrative   Lives at home w/ his wife   Right-handed   Caffeine: 2 cups of coffee per day, rare soft drink     Review of Systems: General: negative for chills, fever, night sweats or weight changes.  Cardiovascular: negative for chest pain, dyspnea on exertion, edema, orthopnea, palpitations, paroxysmal nocturnal dyspnea or shortness of breath Dermatological: negative for  rash Respiratory: negative for cough or wheezing Urologic: negative for hematuria Abdominal: negative for nausea, vomiting, diarrhea, bright red blood per rectum, melena, or hematemesis Neurologic: negative for visual changes, syncope, or dizziness All other systems reviewed and are otherwise negative except as noted above.    Blood pressure 140/78, pulse 67, height 5\' 6"  (1.676 m), weight 167 lb (75.8 kg).  General appearance: alert and no distress Neck: no adenopathy, no carotid bruit, no JVD, supple, symmetrical, trachea midline and thyroid not enlarged, symmetric, no tenderness/mass/nodules Lungs: clear to auscultation bilaterally Heart: regular rate and rhythm, S1, S2 normal, no murmur, click, rub or gallop Extremities: extremities normal, atraumatic, no cyanosis or edema  EKG Not performed today  ASSESSMENT AND PLAN:   Essential hypertension History of essential hypertension with blood pressure measured 140/78. He is on enalapril and metoprolol. Continue current meds at current dosing  CAD S/P percutaneous coronary angioplasty History of CAD status post RCA and ramus breast branch stenting by myself in 1997. I catheterized him 12/22/03 revealing patent stents with 60% mid LAD lesion and normal LV function. Myoview performed 02/15/13 revealed a moderate sized lateral wall scar consistent with his anatomy. He denies chest pain or shortness of breath. He is clear to low risk for upcoming shoulder surgery.  Dyslipidemia History of dyslipidemia on statin therapy followed by his PCP  Syncope and collapse Recent episode of syncope for unclear reasons. Blood pressure is normal. He is chronically dizzy. He had a negative brain MRI apparently. I suggested that he see an ENT doctor for further evaluation.      Lorretta Harp MD FACP,FACC,FAHA, [facility] 05/03/2017 12:03 PM

## 2017-05-03 NOTE — H&P (Signed)
Donald Todd is an 83 y.o. male.    Chief Complaint: left shoulder pain and weakness  HPI: Pt is a 83 y.o. male complaining of left shoulder pain for multiple years. Pain had continually increased since the beginning. X-rays in the clinic show end-stage arthritic changes of the left shoulder. Pt has tried various conservative treatments which have failed to alleviate their symptoms, including injections and therapy. Various options are discussed with the patient. Risks, benefits and expectations were discussed with the patient. Patient understand the risks, benefits and expectations and wishes to proceed with surgery.   PCP:  [name], MD  D/C Plans: Home  PMH: Past Medical History:  Diagnosis Date  . Arthritis   . CAD (coronary artery disease)    last cath. 07-09-2010  . Cancer [facility])    kidney cancer , prostate cancer   . Chronic kidney disease   . H/O cardiovascular stress test 08-04-2010   ef 55% NO ISCHEMIA  . H/O unilateral nephrectomy    lt. nephrectomy  . Heart attack ([place]) 1993  . Hyperlipemia   . Hypertension    renal dopplers 218.13-normal patency  . Inguinal hernia 08/01/11   right  . Prostate disease    Cancer S/P seed implant    PSH: Past Surgical History:  Procedure Laterality Date  . ANGIOPLASTY  1993  . athroscopic knee surgery  2002   right  . bone scan  2005  . CARDIAC CATHETERIZATION  07-09-2010   normal left ventricular function, coronary obsstructive disease with 20% proximal an 30-40% mid lt. anterior descending narrowing ; widely patent stent in the proximal ramus intermediate vessel; 50-70-% narrowing in the mid circumflex,and widely patent stent  in the rt. coronary   . CARDIAC CATHETERIZATION  12-22-2003   widely patent stents with noncritical coronary artery disease and normalLV function  . CHOLECYSTECTOMY  05/05/2009   Laparoscopic  . colonscopy  2004   with biopsy  . CORONARY ANGIOPLASTY WITH STENT PLACEMENT  03-15-1996   pci to rci  and ramus branch using j&j [id number] stent,post dialated  at  20atm (rca)  and 14 atm (ramus branch. ef 55%            . HERNIA REPAIR  08/01/11   RIH  . INGUINAL HERNIA REPAIR Left 03/29/2013   Procedure: HERNIA REPAIR INGUINAL ADULT;  Surgeon: Edward Jolly, MD;  Location: [facility];  Service: General;  Laterality: Left;  . INSERTION OF MESH Left 03/29/2013   Procedure: INSERTION OF MESH;  Surgeon: Edward Jolly, MD;  Location: [facility];  Service: General;  Laterality: Left;  . JOINT REPLACEMENT  2006 and 2010   left 2006, right 2010  . KIDNEY SURGERY  2002 and 2005   2002 - partial removal of left. 2005 complete removal  . PROSTATE BIOPSY    . prostate seed implant    . radiation treatment  2002   prostate  . stent implants  1997    Social History:  reports that he quit smoking about 53 years ago. His smoking use included Cigarettes and Cigars. He has never used smokeless tobacco. He reports that he does not drink alcohol or use drugs.  Allergies:  Allergies  Allergen Reactions  . Tape Hives  . Hydrocodone   . Iohexol      Desc: PT DOES RECALL REACTION JUST SAYS IT WAS A LONG TIME AGO FOR KIDNEY X-RAYS AND HE DIDN'T TOLERATE IT WELL-ARS 05/02/09-NOTE E-CHART STATES A HOT FEELING IS  HIS REACTION, BUT HE CAN'T CONFIRM THAT WAS ALL   . Oxycodone Other (See Comments)    Shaking uncontrollably     Medications: No current facility-administered medications for this encounter.    Current Outpatient Prescriptions  Medication Sig Dispense Refill  . amLODipine (NORVASC) 2.5 MG tablet Take 2 tablets (5 mg total) by mouth every morning. 180 tablet 1  . amoxicillin (AMOXIL) 500 MG capsule Take 2,000 mg by mouth as needed. One hour before dental procedure    . aspirin 81 MG tablet Take 81 mg by mouth daily.     [name] atorvastatin (LIPITOR) 40 MG tablet TAKE 1 TABLET AT BEDTIME 90 tablet 2  . cholecalciferol (VITAMIN D) 1000 units tablet Take 1,000 Units by mouth daily.    . enalapril  (VASOTEC) 10 MG tablet Take 1 tablet (10 mg total) by mouth at bedtime. 90 tablet 1  . Ergocalciferol (VITAMIN D2) 2000 units TABS Take 1 tablet by mouth daily.    . hydrOXYzine (ATARAX/VISTARIL) 25 MG tablet Take 25 mg by mouth every evening.    . isosorbide mononitrate (IMDUR) 30 MG 24 hr tablet Take 2 tablets (60 mg total) by mouth every morning. 360 tablet 1  . loratadine (CLARITIN) 10 MG tablet Take 10 mg by mouth daily.    . metoprolol succinate (TOPROL XL) 25 MG 24 hr tablet Take 1 tablet (25 mg total) by mouth every morning. 90 tablet 1  . Multiple Vitamins-Minerals (OCUVITE PO) Take 1 capsule by mouth 2 (two) times daily.     . nitroGLYCERIN (NITROSTAT) 0.4 MG SL tablet Place 1 tablet (0.4 mg total) under the tongue as needed. 25 tablet 3  . omeprazole (PRILOSEC) 20 MG capsule TAKE 1 CAPSULE DAILY 90 capsule 3  . Probiotic Product (PROBIOTIC PO) Take by mouth daily.      No results found for this or any previous visit (from the past 48 hour(s)). No results found.  ROS: Pain with rom of the left upper extremity  Physical Exam:  Alert and oriented 83 y.o. male in no acute distress Cranial nerves 2-12 intact Cervical spine: full rom with no tenderness, nv intact distally Chest: active breath sounds bilaterally, no wheeze rhonchi or rales Heart: regular rate and rhythm, no murmur Abd: non tender non distended with active bowel sounds Hip is stable with rom  Left shoulder with limited rom and strength due to arthropathy nv intact distally No rashes or edema Strength limited with ER and IR  Assessment/Plan Assessment: left shoulder cuff arthropathy  Plan: Patient will undergo a left reverse total shoulder by Dr. Veverly Fells at [facility]. Risks benefits and expectations were discussed with the patient. Patient understand risks, benefits and expectations and wishes to proceed.

## 2017-05-03 NOTE — Assessment & Plan Note (Signed)
History of CAD status post RCA and ramus breast branch stenting by myself in 1997. I catheterized him 12/22/03 revealing patent stents with 60% mid LAD lesion and normal LV function. Myoview performed 02/15/13 revealed a moderate sized lateral wall scar consistent with his anatomy. He denies chest pain or shortness of breath. He is clear to low risk for upcoming shoulder surgery.

## 2017-05-03 NOTE — Assessment & Plan Note (Signed)
History of dyslipidemia on statin therapy followed by his PCP 

## 2017-05-03 NOTE — Assessment & Plan Note (Signed)
History of essential hypertension with blood pressure measured 140/78. He is on enalapril and metoprolol. Continue current meds at current dosing

## 2017-05-03 NOTE — Patient Instructions (Signed)
Medication Instructions: Your physician recommends that you continue on your current medications as directed. Please refer to the Current Medication list given to you today.   Follow-Up: We request that you follow-up in: 6 months with Luke Kilroy, PA and in 12 months with Dr Berry  You will receive a reminder letter in the mail two months in advance. If you don't receive a letter, please call our office to schedule the follow-up appointment.  If you need a refill on your cardiac medications before your next appointment, please call your pharmacy.  

## 2017-05-16 NOTE — H&P (Signed)
Donald Todd is an 83 y.o. male.    Chief Complaint: left shoulder pain/weakness   HPI: Pt is a 84 y.o. male complaining of left shoulder pain for multiple years. Pain had continually increased since the beginning. X-rays in the clinic show end-stage arthritic changes of the left shoulder. Pt has tried various conservative treatments which have failed to alleviate their symptoms, including injections and therapy. Various options are discussed with the patient. Risks, benefits and expectations were discussed with the patient. Patient understand the risks, benefits and expectations and wishes to proceed with surgery.   PCP:  [name], MD  D/C Plans: Home  PMH: Past Medical History:  Diagnosis Date  . Arthritis   . CAD (coronary artery disease)    last cath. 07-09-2010  . [facility])    kidney cancer , prostate cancer   . Chronic kidney disease   . H/O cardiovascular stress test 08-04-2010   ef 55% NO ISCHEMIA  . H/O unilateral nephrectomy    lt. nephrectomy  . Heart attack ([place]) 1993  . Hyperlipemia   . Hypertension    renal dopplers 218.13-normal patency  . Inguinal hernia 08/01/11   right  . Prostate disease    Cancer S/P seed implant    PSH: Past Surgical History:  Procedure Laterality Date  . ANGIOPLASTY  1993  . athroscopic knee surgery  2002   right  . bone scan  2005  . CARDIAC CATHETERIZATION  07-09-2010   normal left ventricular function, coronary obsstructive disease with 20% proximal an 30-40% mid lt. anterior descending narrowing ; widely patent stent in the proximal ramus intermediate vessel; 50-70-% narrowing in the mid circumflex,and widely patent stent  in the rt. coronary   . CARDIAC CATHETERIZATION  12-22-2003   widely patent stents with noncritical coronary artery disease and normalLV function  . CHOLECYSTECTOMY  05/05/2009   Laparoscopic  . colonscopy  2004   with biopsy  . CORONARY ANGIOPLASTY WITH STENT PLACEMENT  03-15-1996   pci to rci and  ramus branch using j&j [id number] stent,post dialated  at  20atm (rca)  and 14 atm (ramus branch. ef 55%            . HERNIA REPAIR  08/01/11   RIH  . INGUINAL HERNIA REPAIR Left 03/29/2013   Procedure: HERNIA REPAIR INGUINAL ADULT;  Surgeon: Edward Jolly, MD;  Location: [facility];  Service: General;  Laterality: Left;  . INSERTION OF MESH Left 03/29/2013   Procedure: INSERTION OF MESH;  Surgeon: Edward Jolly, MD;  Location: [facility];  Service: General;  Laterality: Left;  . JOINT REPLACEMENT  2006 and 2010   left 2006, right 2010  . KIDNEY SURGERY  2002 and 2005   2002 - partial removal of left. 2005 complete removal  . PROSTATE BIOPSY    . prostate seed implant    . radiation treatment  2002   prostate  . stent implants  1997    Social History:  reports that he quit smoking about 53 years ago. His smoking use included Cigarettes and Cigars. He has never used smokeless tobacco. He reports that he does not drink alcohol or use drugs.  Allergies:  Allergies  Allergen Reactions  . Tape Hives    Surgical tape - causes blisters  . Hydrocodone   . Iohexol      Desc: PT DOES RECALL REACTION JUST SAYS IT WAS A LONG TIME AGO FOR KIDNEY X-RAYS AND HE DIDN'T TOLERATE IT WELL-ARS  05/02/09-NOTE E-CHART STATES A HOT FEELING IS HIS REACTION, BUT HE CAN'T CONFIRM THAT WAS ALL   . Oxycodone Other (See Comments)    Shaking uncontrollably     Medications: No current facility-administered medications for this encounter.    Current Outpatient Prescriptions  Medication Sig Dispense Refill  . amLODipine (NORVASC) 2.5 MG tablet Take 2 tablets (5 mg total) by mouth every morning. 180 tablet 1  . amoxicillin (AMOXIL) 500 MG capsule Take 2,000 mg by mouth as needed. One hour before dental procedure    . aspirin 81 MG tablet Take 81 mg by mouth daily.     [name] atorvastatin (LIPITOR) 40 MG tablet TAKE 1 TABLET AT BEDTIME 90 tablet 2  . cholecalciferol (VITAMIN D) 1000 units tablet Take 1,000 Units by  mouth daily.    . enalapril (VASOTEC) 10 MG tablet Take 1 tablet (10 mg total) by mouth at bedtime. 90 tablet 1  . hydrOXYzine (ATARAX/VISTARIL) 25 MG tablet Take 25 mg by mouth every evening.    . isosorbide mononitrate (IMDUR) 30 MG 24 hr tablet Take 2 tablets (60 mg total) by mouth every morning. 360 tablet 1  . loratadine (CLARITIN) 10 MG tablet Take 10 mg by mouth daily.    . metoprolol succinate (TOPROL XL) 25 MG 24 hr tablet Take 1 tablet (25 mg total) by mouth every morning. 90 tablet 1  . Multiple Vitamins-Minerals (OCUVITE PO) Take 1 capsule by mouth 2 (two) times daily.     . nitroGLYCERIN (NITROSTAT) 0.4 MG SL tablet Place 1 tablet (0.4 mg total) under the tongue as needed. 25 tablet 3  . omeprazole (PRILOSEC) 20 MG capsule TAKE 1 CAPSULE DAILY 90 capsule 3  . Probiotic Product (PROBIOTIC PO) Take 1 capsule by mouth daily.       No results found for this or any previous visit (from the past 48 hour(s)). No results found.  ROS: Pain with rom of the left upper extremity  Physical Exam:  Alert and oriented 83 y.o. male in no acute distress Cranial nerves 2-12 intact Cervical spine: full rom with no tenderness, nv intact distally Chest: active breath sounds bilaterally, no wheeze rhonchi or rales Heart: regular rate and rhythm, no murmur Abd: non tender non distended with active bowel sounds Hip is stable with rom  Left shoulder with limited rom  nv intact distally Strength limited with ER and IR No rashes or edema  Assessment/Plan Assessment: left shoulder cuff arthropathy  Plan: Patient will undergo a left reverse total shoulder by Dr. Veverly Fells at [facility]. Risks benefits and expectations were discussed with the patient. Patient understand risks, benefits and expectations and wishes to proceed.

## 2017-05-17 NOTE — Pre-Procedure Instructions (Signed)
    Donald Todd  05/17/2017      [id number] - [place], [place] - [address] AT ACROSS FROM [address] [place] [address] Phone: [phone] Fax: [phone]  [facility] - [place], [place] [address] [place] [address] Phone: [phone] Fax: [phone]  [facility] [place], [place] - [address] AT [place] [address] [place] [address] Phone: [phone] Fax: [phone]    Your procedure is scheduled on 05/26/17.  Report to [facility] Admitting at 530 A.M.  Call this number if you have problems the morning of surgery:  [phone]   Remember:  Do not eat food or drink liquids after midnight.  Take these medicines the morning of surgery with A SIP OF WATER --norvasc,imdur,metoprolol   Do not wear jewelry, make-up or nail polish.  Do not wear lotions, powders, or perfumes, or deoderant.  Do not shave 48 hours prior to surgery.  Men may shave face and neck.  Do not bring valuables to the hospital.  [facility] is not responsible for any belongings or valuables.  Contacts, dentures or bridgework may not be worn into surgery.  Leave your suitcase in the car.  After surgery it may be brought to your room.  For patients admitted to the hospital, discharge time will be determined by your treatment team.  Patients discharged the day of surgery will not be allowed to drive home.   Name and phone number of your driver:    Special instructions:  Do not take any aspirin,anti-inflammatories,vitamins,or herbal supplements 5-7 days prior to surgery.  Please read over the following fact sheets that you were given. MRSA Information

## 2017-05-18 ENCOUNTER — Encounter (HOSPITAL_COMMUNITY): Payer: Self-pay

## 2017-05-18 ENCOUNTER — Encounter (HOSPITAL_COMMUNITY)
Admission: RE | Admit: 2017-05-18 | Discharge: 2017-05-18 | Disposition: A | Discharge status: Home / Self Care | Payer: Medicare Other | Source: Ambulatory Visit | Attending: Orthopedic Surgery | Admitting: Orthopedic Surgery

## 2017-05-18 DIAGNOSIS — Z01812 Encounter for preprocedural laboratory examination: Secondary | ICD-10-CM | POA: Insufficient documentation

## 2017-05-18 HISTORY — DX: Unspecified macular degeneration: H35.30

## 2017-05-18 HISTORY — DX: Personal history of urinary calculi: Z87.442

## 2017-05-18 HISTORY — DX: Dizziness and giddiness: R42

## 2017-05-18 LAB — BASIC METABOLIC PANEL
Anion gap: 8 (ref 5–15)
BUN: 21 mg/dL — AB (ref 6–20)
CALCIUM: 9.3 mg/dL (ref 8.9–10.3)
CO2: 24 mmol/L (ref 22–32)
CREATININE: 1.33 mg/dL — AB (ref 0.61–1.24)
Chloride: 106 mmol/L (ref 101–111)
GFR calc Af Amer: 54 mL/min — ABNORMAL LOW (ref >60)
GFR calc non Af Amer: 47 mL/min — ABNORMAL LOW (ref >60)
GLUCOSE: 90 mg/dL (ref 65–99)
Potassium: 4.3 mmol/L (ref 3.5–5.1)
Sodium: 138 mmol/L (ref 135–145)

## 2017-05-18 LAB — CBC
HCT: 39 % (ref 39.0–52.0)
Hemoglobin: 12.7 g/dL — ABNORMAL LOW (ref 13.0–17.0)
MCH: 28.9 pg (ref 26.0–34.0)
MCHC: 32.6 g/dL (ref 30.0–36.0)
MCV: 88.6 fL (ref 78.0–100.0)
Platelets: 157 10*3/uL (ref 150–400)
RBC: 4.4 MIL/uL (ref 4.22–5.81)
RDW: 13.8 % (ref 11.5–15.5)
WBC: 7.3 10*3/uL (ref 4.0–10.5)

## 2017-05-18 LAB — SURGICAL PCR SCREEN
MRSA, PCR: NEGATIVE
Staphylococcus aureus: NEGATIVE

## 2017-05-19 NOTE — Progress Notes (Signed)
Patient called to ask when to stop taking his eye vitamin. Instructed patient to stop taking all vitamins 7 days prior to surgery. Patient verbalized understanding and stated he would stop taking it as of today.

## 2017-05-25 MED ORDER — CEFAZOLIN SODIUM-DEXTROSE 2-4 GM/100ML-% IV SOLN
2.0000 g | INTRAVENOUS | Status: AC
  Administered 2017-05-26: 2 g via INTRAVENOUS
  Filled 2017-05-25: qty 100

## 2017-05-26 ENCOUNTER — Inpatient Hospital Stay (HOSPITAL_COMMUNITY): Payer: Medicare Other | Admitting: Certified Registered"

## 2017-05-26 ENCOUNTER — Encounter (HOSPITAL_COMMUNITY): Payer: Self-pay | Admitting: *Deleted

## 2017-05-26 ENCOUNTER — Encounter (HOSPITAL_COMMUNITY)
Admission: RE | Disposition: A | Discharge status: Home / Self Care | Payer: Self-pay | Source: Ambulatory Visit | Attending: Orthopedic Surgery

## 2017-05-26 ENCOUNTER — Inpatient Hospital Stay (HOSPITAL_COMMUNITY): Payer: Medicare Other

## 2017-05-26 ENCOUNTER — Inpatient Hospital Stay (HOSPITAL_COMMUNITY)
Admission: RE | Admit: 2017-05-26 | Discharge: 2017-05-27 | DRG: 483 | Disposition: A | Discharge status: Home / Self Care | Payer: Medicare Other | Source: Ambulatory Visit | Attending: Orthopedic Surgery | Admitting: Orthopedic Surgery

## 2017-05-26 DIAGNOSIS — I10 Essential (primary) hypertension: Secondary | ICD-10-CM | POA: Diagnosis not present

## 2017-05-26 DIAGNOSIS — Z905 Acquired absence of kidney: Secondary | ICD-10-CM | POA: Diagnosis not present

## 2017-05-26 DIAGNOSIS — G8918 Other acute postprocedural pain: Secondary | ICD-10-CM | POA: Diagnosis not present

## 2017-05-26 DIAGNOSIS — E785 Hyperlipidemia, unspecified: Secondary | ICD-10-CM | POA: Diagnosis not present

## 2017-05-26 DIAGNOSIS — I2510 Atherosclerotic heart disease of native coronary artery without angina pectoris: Secondary | ICD-10-CM | POA: Diagnosis not present

## 2017-05-26 DIAGNOSIS — Z85528 Personal history of other malignant neoplasm of kidney: Secondary | ICD-10-CM | POA: Diagnosis not present

## 2017-05-26 DIAGNOSIS — M12812 Other specific arthropathies, not elsewhere classified, left shoulder: Principal | ICD-10-CM | POA: Diagnosis present

## 2017-05-26 DIAGNOSIS — Z87891 Personal history of nicotine dependence: Secondary | ICD-10-CM | POA: Diagnosis not present

## 2017-05-26 DIAGNOSIS — Z955 Presence of coronary angioplasty implant and graft: Secondary | ICD-10-CM

## 2017-05-26 DIAGNOSIS — Z96612 Presence of left artificial shoulder joint: Secondary | ICD-10-CM | POA: Diagnosis not present

## 2017-05-26 DIAGNOSIS — Z8546 Personal history of malignant neoplasm of prostate: Secondary | ICD-10-CM

## 2017-05-26 DIAGNOSIS — M25512 Pain in left shoulder: Secondary | ICD-10-CM | POA: Diagnosis not present

## 2017-05-26 DIAGNOSIS — I252 Old myocardial infarction: Secondary | ICD-10-CM | POA: Diagnosis not present

## 2017-05-26 DIAGNOSIS — M75102 Unspecified rotator cuff tear or rupture of left shoulder, not specified as traumatic: Secondary | ICD-10-CM | POA: Diagnosis present

## 2017-05-26 DIAGNOSIS — Z471 Aftercare following joint replacement surgery: Secondary | ICD-10-CM | POA: Diagnosis not present

## 2017-05-26 DIAGNOSIS — M19012 Primary osteoarthritis, left shoulder: Secondary | ICD-10-CM | POA: Diagnosis not present

## 2017-05-26 HISTORY — PX: REVERSE SHOULDER ARTHROPLASTY: SHX5054

## 2017-05-26 SURGERY — ARTHROPLASTY, SHOULDER, TOTAL, REVERSE
Anesthesia: General | Site: Shoulder | Laterality: Left

## 2017-05-26 MED ORDER — POLYETHYLENE GLYCOL 3350 17 G PO PACK: 17.0000 g | PACK | Freq: Every day | ORAL | Status: DC | PRN

## 2017-05-26 MED ORDER — ONDANSETRON HCL 4 MG/2ML IJ SOLN
INTRAMUSCULAR | Status: DC | PRN
  Administered 2017-05-26: 4 mg via INTRAVENOUS

## 2017-05-26 MED ORDER — ROCURONIUM BROMIDE 10 MG/ML (PF) SYRINGE
PREFILLED_SYRINGE | INTRAVENOUS | Status: DC | PRN
  Administered 2017-05-26: 50 mg via INTRAVENOUS

## 2017-05-26 MED ORDER — LIDOCAINE 2% (20 MG/ML) 5 ML SYRINGE
INTRAMUSCULAR | Status: DC | PRN
  Administered 2017-05-26: 40 mg via INTRAVENOUS

## 2017-05-26 MED ORDER — PROPOFOL 10 MG/ML IV BOLUS
INTRAVENOUS | Status: AC
  Filled 2017-05-26: qty 20

## 2017-05-26 MED ORDER — HYDROMORPHONE HCL 1 MG/ML IJ SOLN: 0.2500 mg | INTRAMUSCULAR | Status: DC | PRN

## 2017-05-26 MED ORDER — MIDAZOLAM HCL 2 MG/2ML IJ SOLN
INTRAMUSCULAR | Status: AC
  Filled 2017-05-26: qty 2

## 2017-05-26 MED ORDER — LORATADINE 10 MG PO TABS: 10.0000 mg | ORAL_TABLET | Freq: Every day | ORAL | Status: DC

## 2017-05-26 MED ORDER — FENTANYL CITRATE (PF) 100 MCG/2ML IJ SOLN
INTRAMUSCULAR | Status: DC | PRN
  Administered 2017-05-26: 75 ug via INTRAVENOUS
  Administered 2017-05-26: 50 ug via INTRAVENOUS
  Administered 2017-05-26: 75 ug via INTRAVENOUS

## 2017-05-26 MED ORDER — LACTATED RINGERS IV SOLN
INTRAVENOUS | Status: DC | PRN
  Administered 2017-05-26: 07:00:00 via INTRAVENOUS

## 2017-05-26 MED ORDER — METOCLOPRAMIDE HCL 5 MG/ML IJ SOLN: 5.0000 mg | Freq: Three times a day (TID) | INTRAMUSCULAR | Status: DC | PRN

## 2017-05-26 MED ORDER — ACETAMINOPHEN 325 MG PO TABS
650.0000 mg | ORAL_TABLET | Freq: Four times a day (QID) | ORAL | Status: DC | PRN
  Administered 2017-05-26: 650 mg via ORAL
  Filled 2017-05-26: qty 2

## 2017-05-26 MED ORDER — METOPROLOL SUCCINATE ER 25 MG PO TB24: 25.0000 mg | ORAL_TABLET | Freq: Every day | ORAL | Status: DC

## 2017-05-26 MED ORDER — FENTANYL CITRATE (PF) 100 MCG/2ML IJ SOLN: 25.0000 ug | INTRAMUSCULAR | Status: DC | PRN

## 2017-05-26 MED ORDER — LIDOCAINE 2% (20 MG/ML) 5 ML SYRINGE
INTRAMUSCULAR | Status: AC
  Filled 2017-05-26: qty 5

## 2017-05-26 MED ORDER — 0.9 % SODIUM CHLORIDE (POUR BTL) OPTIME
TOPICAL | Status: DC | PRN
  Administered 2017-05-26: 1000 mL

## 2017-05-26 MED ORDER — PROPOFOL 10 MG/ML IV BOLUS
INTRAVENOUS | Status: DC | PRN
Start: 2017-05-26 — End: 2017-05-26
  Administered 2017-05-26: 20 mg via INTRAVENOUS
  Administered 2017-05-26: 140 mg via INTRAVENOUS

## 2017-05-26 MED ORDER — ASPIRIN 81 MG PO CHEW: 81.0000 mg | CHEWABLE_TABLET | Freq: Every day | ORAL | Status: DC

## 2017-05-26 MED ORDER — SUGAMMADEX SODIUM 200 MG/2ML IV SOLN
INTRAVENOUS | Status: AC
  Filled 2017-05-26: qty 2

## 2017-05-26 MED ORDER — ISOSORBIDE MONONITRATE ER 60 MG PO TB24
60.0000 mg | ORAL_TABLET | Freq: Every day | ORAL | Status: DC
  Filled 2017-05-26: qty 1

## 2017-05-26 MED ORDER — DOCUSATE SODIUM 100 MG PO CAPS
100.0000 mg | ORAL_CAPSULE | Freq: Two times a day (BID) | ORAL | Status: DC
  Administered 2017-05-26: 100 mg via ORAL
  Filled 2017-05-26: qty 1

## 2017-05-26 MED ORDER — PANTOPRAZOLE SODIUM 40 MG PO TBEC
40.0000 mg | DELAYED_RELEASE_TABLET | Freq: Every day | ORAL | Status: DC
  Administered 2017-05-26: 40 mg via ORAL
  Filled 2017-05-26: qty 1

## 2017-05-26 MED ORDER — ONDANSETRON HCL 4 MG/2ML IJ SOLN: 4.0000 mg | Freq: Four times a day (QID) | INTRAMUSCULAR | Status: DC | PRN

## 2017-05-26 MED ORDER — ENALAPRIL MALEATE 10 MG PO TABS
10.0000 mg | ORAL_TABLET | Freq: Every day | ORAL | Status: DC
  Administered 2017-05-26: 10 mg via ORAL
  Filled 2017-05-26: qty 1

## 2017-05-26 MED ORDER — NITROGLYCERIN 0.4 MG SL SUBL: 0.4000 mg | SUBLINGUAL_TABLET | SUBLINGUAL | Status: DC | PRN

## 2017-05-26 MED ORDER — TRAMADOL HCL 50 MG PO TABS: 50.0000 mg | ORAL_TABLET | Freq: Four times a day (QID) | ORAL | 0 refills | Status: AC | PRN

## 2017-05-26 MED ORDER — PHENOL 1.4 % MT LIQD: 1.0000 | OROMUCOSAL | Status: DC | PRN

## 2017-05-26 MED ORDER — BUPIVACAINE-EPINEPHRINE 0.25% -1:200000 IJ SOLN
INTRAMUSCULAR | Status: DC | PRN
  Administered 2017-05-26: 8 mL

## 2017-05-26 MED ORDER — CEFAZOLIN SODIUM-DEXTROSE 2-4 GM/100ML-% IV SOLN
2.0000 g | Freq: Four times a day (QID) | INTRAVENOUS | Status: AC
  Administered 2017-05-26 – 2017-05-27 (×3): 2 g via INTRAVENOUS
  Filled 2017-05-26 (×3): qty 100

## 2017-05-26 MED ORDER — MENTHOL 3 MG MT LOZG: 1.0000 | LOZENGE | OROMUCOSAL | Status: DC | PRN

## 2017-05-26 MED ORDER — TRAMADOL HCL 50 MG PO TABS
50.0000 mg | ORAL_TABLET | Freq: Four times a day (QID) | ORAL | Status: DC | PRN
  Administered 2017-05-26 – 2017-05-27 (×2): 50 mg via ORAL
  Filled 2017-05-26 (×2): qty 1

## 2017-05-26 MED ORDER — SODIUM CHLORIDE 0.9 % IV SOLN: INTRAVENOUS | Status: DC

## 2017-05-26 MED ORDER — PHENYLEPHRINE HCL 10 MG/ML IJ SOLN
INTRAVENOUS | Status: DC | PRN
  Administered 2017-05-26: 20 ug/min via INTRAVENOUS

## 2017-05-26 MED ORDER — SUGAMMADEX SODIUM 200 MG/2ML IV SOLN
INTRAVENOUS | Status: DC | PRN
  Administered 2017-05-26: 200 mg via INTRAVENOUS

## 2017-05-26 MED ORDER — ONDANSETRON HCL 4 MG/2ML IJ SOLN: 4.0000 mg | Freq: Once | INTRAMUSCULAR | Status: DC | PRN

## 2017-05-26 MED ORDER — AMLODIPINE BESYLATE 5 MG PO TABS
5.0000 mg | ORAL_TABLET | Freq: Every day | ORAL | Status: DC
  Filled 2017-05-26: qty 1

## 2017-05-26 MED ORDER — ATORVASTATIN CALCIUM 20 MG PO TABS
40.0000 mg | ORAL_TABLET | Freq: Every day | ORAL | Status: DC
  Administered 2017-05-26: 40 mg via ORAL
  Filled 2017-05-26: qty 2

## 2017-05-26 MED ORDER — HYDROXYZINE HCL 25 MG PO TABS
25.0000 mg | ORAL_TABLET | Freq: Every evening | ORAL | Status: DC
  Administered 2017-05-26: 25 mg via ORAL
  Filled 2017-05-26: qty 1

## 2017-05-26 MED ORDER — CHLORHEXIDINE GLUCONATE 4 % EX LIQD: 60.0000 mL | Freq: Once | CUTANEOUS | Status: DC

## 2017-05-26 MED ORDER — BUPIVACAINE-EPINEPHRINE (PF) 0.25% -1:200000 IJ SOLN
INTRAMUSCULAR | Status: AC
  Filled 2017-05-26: qty 30

## 2017-05-26 MED ORDER — MIDAZOLAM HCL 5 MG/5ML IJ SOLN
INTRAMUSCULAR | Status: DC | PRN
  Administered 2017-05-26: 1 mg via INTRAVENOUS

## 2017-05-26 MED ORDER — ONDANSETRON HCL 4 MG PO TABS: 4.0000 mg | ORAL_TABLET | Freq: Four times a day (QID) | ORAL | Status: DC | PRN

## 2017-05-26 MED ORDER — FENTANYL CITRATE (PF) 250 MCG/5ML IJ SOLN
INTRAMUSCULAR | Status: AC
  Filled 2017-05-26: qty 5

## 2017-05-26 MED ORDER — ACETAMINOPHEN 650 MG RE SUPP: 650.0000 mg | Freq: Four times a day (QID) | RECTAL | Status: DC | PRN

## 2017-05-26 MED ORDER — METOCLOPRAMIDE HCL 5 MG PO TABS: 5.0000 mg | ORAL_TABLET | Freq: Three times a day (TID) | ORAL | Status: DC | PRN

## 2017-05-26 MED ORDER — VITAMIN D 1000 UNITS PO TABS: 1000.0000 [IU] | ORAL_TABLET | Freq: Every day | ORAL | Status: DC

## 2017-05-26 MED ORDER — ONDANSETRON HCL 4 MG/2ML IJ SOLN
INTRAMUSCULAR | Status: AC
  Filled 2017-05-26: qty 2

## 2017-05-26 MED ORDER — ROCURONIUM BROMIDE 10 MG/ML (PF) SYRINGE
PREFILLED_SYRINGE | INTRAVENOUS | Status: AC
  Filled 2017-05-26: qty 5

## 2017-05-26 SURGICAL SUPPLY — 66 items
BIT DRILL 170X2.5X (BIT) IMPLANT
BIT DRILL 5/64X5 DISP (BIT) ×1 IMPLANT
BIT DRL 170X2.5X (BIT)
BLADE SAG 18X100X1.27 (BLADE) ×3 IMPLANT
CAPT SHLDR REVTOTAL 1 ×2 IMPLANT
CLOSURE WOUND 1/2 X4 (GAUZE/BANDAGES/DRESSINGS) ×1
COVER SURGICAL LIGHT HANDLE (MISCELLANEOUS) ×3 IMPLANT
DRAPE IMP U-DRAPE 54X76 (DRAPES) ×6 IMPLANT
DRAPE INCISE IOBAN 66X45 STRL (DRAPES) ×3 IMPLANT
DRAPE ORTHO SPLIT 77X108 STRL (DRAPES) ×6
DRAPE SURG ORHT 6 SPLT 77X108 (DRAPES) ×2 IMPLANT
DRAPE U-SHAPE 47X51 STRL (DRAPES) ×3 IMPLANT
DRILL 2.5 (BIT)
DRSG ADAPTIC 3X8 NADH LF (GAUZE/BANDAGES/DRESSINGS) ×3 IMPLANT
DRSG PAD ABDOMINAL 8X10 ST (GAUZE/BANDAGES/DRESSINGS) ×3 IMPLANT
DURAPREP 26ML APPLICATOR (WOUND CARE) ×3 IMPLANT
ELECT BLADE 4.0 EZ CLEAN MEGAD (MISCELLANEOUS) ×3
ELECT NDL TIP 2.8 STRL (NEEDLE) ×1 IMPLANT
ELECT NEEDLE TIP 2.8 STRL (NEEDLE) ×3 IMPLANT
ELECT REM PT RETURN 9FT ADLT (ELECTROSURGICAL) ×3
ELECTRODE BLDE 4.0 EZ CLN MEGD (MISCELLANEOUS) ×1 IMPLANT
ELECTRODE REM PT RTRN 9FT ADLT (ELECTROSURGICAL) ×1 IMPLANT
GAUZE SPONGE 4X4 12PLY STRL (GAUZE/BANDAGES/DRESSINGS) ×3 IMPLANT
GLOVE BIOGEL PI ORTHO PRO 7.5 (GLOVE) ×2
GLOVE BIOGEL PI ORTHO PRO SZ8 (GLOVE) ×2
GLOVE ORTHO TXT STRL SZ7.5 (GLOVE) ×3 IMPLANT
GLOVE PI ORTHO PRO STRL 7.5 (GLOVE) ×1 IMPLANT
GLOVE PI ORTHO PRO STRL SZ8 (GLOVE) ×1 IMPLANT
GLOVE SURG ORTHO 8.5 STRL (GLOVE) ×3 IMPLANT
GOWN STRL REUS W/ TWL LRG LVL3 (GOWN DISPOSABLE) ×1 IMPLANT
GOWN STRL REUS W/ TWL XL LVL3 (GOWN DISPOSABLE) ×2 IMPLANT
GOWN STRL REUS W/TWL LRG LVL3 (GOWN DISPOSABLE) ×3
GOWN STRL REUS W/TWL XL LVL3 (GOWN DISPOSABLE) ×6
KIT BASIN OR (CUSTOM PROCEDURE TRAY) ×3 IMPLANT
KIT ROOM TURNOVER OR (KITS) ×3 IMPLANT
MANIFOLD NEPTUNE II (INSTRUMENTS) ×3 IMPLANT
NDL 1/2 CIR MAYO (NEEDLE) ×1 IMPLANT
NDL HYPO 25GX1X1/2 BEV (NEEDLE) ×1 IMPLANT
NEEDLE 1/2 CIR MAYO (NEEDLE) IMPLANT
NEEDLE HYPO 25GX1X1/2 BEV (NEEDLE) ×3 IMPLANT
NS IRRIG 1000ML POUR BTL (IV SOLUTION) ×3 IMPLANT
PACK SHOULDER (CUSTOM PROCEDURE TRAY) ×3 IMPLANT
PAD ARMBOARD 7.5X6 YLW CONV (MISCELLANEOUS) ×6 IMPLANT
PIN GUIDE 1.2 (PIN) IMPLANT
PIN GUIDE GLENOPHERE 1.5MX300M (PIN) IMPLANT
PIN METAGLENE 2.5 (PIN) IMPLANT
SLING ARM FOAM STRAP LRG (SOFTGOODS) ×2 IMPLANT
SLING ARM FOAM STRAP MED (SOFTGOODS) IMPLANT
SPONGE LAP 18X18 X RAY DECT (DISPOSABLE) IMPLANT
SPONGE LAP 4X18 X RAY DECT (DISPOSABLE) ×3 IMPLANT
STRIP CLOSURE SKIN 1/2X4 (GAUZE/BANDAGES/DRESSINGS) ×2 IMPLANT
SUCTION FRAZIER HANDLE 10FR (MISCELLANEOUS) ×2
SUCTION TUBE FRAZIER 10FR DISP (MISCELLANEOUS) ×1 IMPLANT
SUT FIBERWIRE #2 38 T-5 BLUE (SUTURE) ×6
SUT MNCRL AB 4-0 PS2 18 (SUTURE) ×3 IMPLANT
SUT VIC AB 0 CT2 27 (SUTURE) ×3 IMPLANT
SUT VIC AB 2-0 CT1 27 (SUTURE) ×3
SUT VIC AB 2-0 CT1 TAPERPNT 27 (SUTURE) ×1 IMPLANT
SUT VICRYL 0 CT 1 36IN (SUTURE) ×3 IMPLANT
SUTURE FIBERWR #2 38 T-5 BLUE (SUTURE) ×2 IMPLANT
SYR CONTROL 10ML LL (SYRINGE) ×3 IMPLANT
TOWEL OR 17X24 6PK STRL BLUE (TOWEL DISPOSABLE) ×3 IMPLANT
TOWEL OR 17X26 10 PK STRL BLUE (TOWEL DISPOSABLE) ×3 IMPLANT
TOWER CARTRIDGE SMART MIX (DISPOSABLE) IMPLANT
WATER STERILE IRR 1000ML POUR (IV SOLUTION) ×3 IMPLANT
YANKAUER SUCT BULB TIP NO VENT (SUCTIONS) ×3 IMPLANT

## 2017-05-26 NOTE — Transfer of Care (Signed)
Immediate Anesthesia Transfer of Care Note  Patient: Donald Todd  Procedure(s) Performed: Procedure(s): REVERSE LEFT TOTAL SHOULDER ARTHROPLASTY (Left)  Patient Location: PACU  Anesthesia Type:GA combined with regional for post-op pain  Level of Consciousness: awake, oriented and patient cooperative  Airway & Oxygen Therapy: Patient Spontanous Breathing and Patient connected to nasal cannula oxygen  Post-op Assessment: Report given to RN, Post -op Vital signs reviewed and stable and Patient moving all extremities  Post vital signs: Reviewed and stable  Last Vitals:  Vitals:   05/26/17 0605 05/26/17 0911  BP: (!) 170/72   Pulse: 68   Resp: 20   Temp: 36.7 C (P) 36.6 C    Last Pain:  Vitals:   05/26/17 0605  TempSrc: Oral      Patients Stated Pain Goal: 3 (67/12/45 8099)  Complications: No apparent anesthesia complications

## 2017-05-26 NOTE — Brief Op Note (Signed)
05/26/2017  9:17 AM  PATIENT:  Donald Todd  83 y.o. male  PRE-OPERATIVE DIAGNOSIS:  Left shoulder rotator cuff tear arthropathy  POST-OPERATIVE DIAGNOSIS:  Left shoulder rotator cuff tear arthropathy  PROCEDURE:  Procedure(s): REVERSE LEFT TOTAL SHOULDER ARTHROPLASTY (Left) DePuy delta Xtend  SURGEON:  Surgeon(s) and Role:    Netta Cedars, MD - Primary  PHYSICIAN ASSISTANT:   ASSISTANTS: Ventura Bruns, PA-C   ANESTHESIA:   regional and general  EBL:  Total I/O In: 700 [I.V.:700] Out: 75 [Blood:75]  BLOOD ADMINISTERED:none  DRAINS: none   LOCAL MEDICATIONS USED:  MARCAINE     SPECIMEN:  No Specimen  DISPOSITION OF SPECIMEN:  N/A  COUNTS:  YES  TOURNIQUET:  * No tourniquets in log *  DICTATION: .Other Dictation: Dictation Number [id number]  PLAN OF CARE: Admit to inpatient   PATIENT DISPOSITION:  PACU - hemodynamically stable.   Delay start of Pharmacological VTE agent (>24hrs) due to surgical blood loss or risk of bleeding: not applicable

## 2017-05-26 NOTE — Discharge Instructions (Signed)
Ice to the shoulder as much as possible.  Keep the incision clean and dry and covered for one week, then ok to get it wet in the shower.  Ok to remove the sling and use the left arm as you are able.  Keep a pillow or rolled blanket propped behind the left elbow  Follow up with Dr Veverly Fells in two weeks in the office, [phone]

## 2017-05-26 NOTE — Anesthesia Procedure Notes (Signed)
Procedure Name: Intubation Date/Time: 05/26/2017 7:39 AM Performed by: Melina Copa, Telena Peyser R Pre-anesthesia Checklist: Patient identified, Emergency Drugs available, Suction available and Patient being monitored Patient Re-evaluated:Patient Re-evaluated prior to inductionOxygen Delivery Method: Circle System Utilized Preoxygenation: Pre-oxygenation with 100% oxygen Intubation Type: IV induction Ventilation: Mask ventilation without difficulty Laryngoscope Size: Mac and 4 Grade View: Grade II Tube type: Oral Tube size: 8.0 mm Number of attempts: 1 Airway Equipment and Method: Stylet Placement Confirmation: ETT inserted through vocal cords under direct vision,  positive ETCO2 and breath sounds checked- equal and bilateral Secured at: 21 cm Tube secured with: Tape Dental Injury: Teeth and Oropharynx as per pre-operative assessment

## 2017-05-26 NOTE — Anesthesia Preprocedure Evaluation (Addendum)
Anesthesia Evaluation  Patient identified by MRN, date of birth, ID band Patient awake    Reviewed: Allergy & Precautions, NPO status , Patient's Chart, lab work & pertinent test results  Airway Mallampati: II  TM Distance: >3 FB Neck ROM: Full    Dental  (+) Teeth Intact, Dental Advisory Given   Pulmonary former smoker,    breath sounds clear to auscultation       Cardiovascular hypertension, Pt. on medications and Pt. on home beta blockers  Rhythm:Regular Rate:Normal     Neuro/Psych    GI/Hepatic   Endo/Other    Renal/GU Renal InsufficiencyRenal disease     Musculoskeletal   Abdominal   Peds  Hematology   Anesthesia Other Findings   Reproductive/Obstetrics                            Anesthesia Physical Anesthesia Plan  ASA: III  Anesthesia Plan: General   Post-op Pain Management:  Regional for Post-op pain   Induction: Intravenous  PONV Risk Score and Plan: Dexamethasone and Ondansetron  Airway Management Planned: Oral ETT  Additional Equipment:   Intra-op Plan:   Post-operative Plan: Extubation in OR  Informed Consent: I have reviewed the patients History and Physical, chart, labs and discussed the procedure including the risks, benefits and alternatives for the proposed anesthesia with the patient or authorized representative who has indicated his/her understanding and acceptance.   Dental advisory given  Plan Discussed with: CRNA, Anesthesiologist and Surgeon  Anesthesia Plan Comments:        Anesthesia Quick Evaluation

## 2017-05-26 NOTE — Anesthesia Procedure Notes (Addendum)
Anesthesia Regional Block: Interscalene brachial plexus block   Pre-Anesthetic Checklist: ,, timeout performed, Correct Patient, Correct Site, Correct Laterality, Correct Procedure, Correct Position, site marked, Risks and benefits discussed,  Surgical consent,  Pre-op evaluation,  At surgeon's request and post-op pain management  Laterality: Left  Prep: chloraprep        Narrative:  Start time: 05/26/2017 7:10 AM End time: 05/26/2017 7:15 AM Injection made incrementally with aspirations every 5 mL.  Performed by: Personally   Additional Notes: 30 cc 0.5% Bupivacaine with 1:200 Epi injected easily

## 2017-05-26 NOTE — Interval H&P Note (Signed)
History and Physical Interval Note:  05/26/2017 7:20 AM  Donald Todd  has presented today for surgery, with the diagnosis of Left shoulder rotator cuff tear arthropathy  The various methods of treatment have been discussed with the patient and family. After consideration of risks, benefits and other options for treatment, the patient has consented to  Procedure(s): REVERSE LEFT SHOULDER ARTHROPLASTY (Left) as a surgical intervention .  The patient's history has been reviewed, patient examined, no change in status, stable for surgery.  I have reviewed the patient's chart and labs.  Questions were answered to the patient's satisfaction.     Lauri Purdum,STEVEN R

## 2017-05-26 NOTE — Op Note (Signed)
NAME:  Donald Todd, Donald Todd NO.:  [id number]  MEDICAL RECORD NO.:  [id number]  LOCATION:                                 FACILITY:  PHYSICIAN:  Doran Heater. Veverly Fells, M.D.      DATE OF BIRTH:  DATE OF PROCEDURE:  05/26/2017 DATE OF DISCHARGE:                              OPERATIVE REPORT   JUNE 21, 2018PREOPERATIVE DIAGNOSIS:  Left shoulder rotator cuff tear arthropathy.  POSTOPERATIVE DIAGNOSIS:  Left shoulder rotator cuff tear arthropathy  PROCEDURE PERFORMED:  Left reverse total shoulder arthroplasty using DePuy Delta Xtend prosthesis.  ATTENDING SURGEON:  Doran Heater. Veverly Fells, M.D.  ASSISTANT:  Charletta Cousin [place], [facility] who scrubbed the entire procedure and necessary for satisfactory completion of surgery.  ANESTHESIA:  General anesthesia was used plus interscalene block.  ESTIMATED BLOOD LOSS:  150 mL.  FLUID REPLACEMENT:  1500 mL of crystalloid.  INSTRUMENT COUNTS:  Correct.  COMPLICATIONS:  There were no complications.  Perioperative antibiotics were given.  INDICATIONS:  The patient is an 83 year old male with worsening left shoulder pain and dysfunction secondary to rotator cuff tear arthropathy.  The patient has significant superior head migration on both plain film imaging and MRI, MRI demonstrating complete loss of supraspinatus and infraspinatus and some atrophy of the subscapularis. Due to progression of pain and also the limitations in activity and range of motion and ADLs, the patient presents desiring reverse total shoulder arthroplasty to relieve pain and restore function.  Informed consent obtained.  DESCRIPTION OF PROCEDURE:  After an adequate level of anesthesia achieved, the patient was positioned in the modified beach-chair position.  Left shoulder correctly identified, sterilely prepped and draped in usual manner.  Time-out was called.  After sterile prep and drape and time-out, we entered the shoulder using a standard deltopectoral  incision starting at the coracoid process, extending down to the anterior humerus.  Dissection down through subcutaneous tissues using needle-tip Bovie, identified the cephalic vein, took it laterally with the deltoid, pectoralis taken medially.  Conjoint tendon identified and retracted medially.  We placed our deep retractors.  I went ahead and tenodesed the biceps in-situ with figure-of-eight suture of 0 Vicryl x2, incorporating some pectoralis tendon in that.  We then went ahead and released the subscapularis remnant off the lesser tuberosity tagging for retraction.  We released the inferior capsule off the humerus, progressively externally rotating.  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 We extended the shoulder, delivering the humeral head out of the wound, none of the super infraspinatus was attached.  Teres minor was felt to be attached.  We entered the proximal humerus with a 6-mm reamer reaming up to a size 10.  We placed our 10-mm guide for the resection of the head.  We placed that at 10 degrees of retroversion, resected with the oscillating saw to the appropriate height.  We removed excess osteophytes with a rongeur that were minimal.  At this point, we went ahead and prepared the metaphysis with the metaphyseal reamer set on size 1 epiphyseal so it was eccentric and left.  We reamed for the prosthesis.  We then inserted the trial prosthesis, made sure that it fit appropriately.  We then retracted the  humerus posteriorly, removed the remnant of the rotator cuff which was retracted mostly medially, took the biceps stump out.  We also did a capsule labral excision.  We got back to 360-degree exposure of the glenoid, retracting the humerus posteriorly.  We were careful to protect the axillary nerve.  We removed the remaining cartilage with a Cobb elevator, placed a guide pin in the central portion of glenoid and then reamed for the metaglene.  We did very little reaming just getting  down through a nice smooth surface and subchondral bone.  We drilled our central peg hole, impacted the metaglene into position of our base plate.  We then placed a 42 screw inferiorly, 36 at the base of the coracoid, and then an 18 nonlocked posteriorly, so the superior and inferior screws were locked with good base plate secured, we placed a 38 standard glenosphere into position and screwed that into place using impactor and then tightened the screw up a little bit more.  We had a very nice coverage and good clearance over the bottom of the scapular neck.  We then went ahead and reduced the shoulder with a 38 plus 3 trial.  Felt like we had excellent stability, retrieved the trial humeral component, irrigated thoroughly and then used the HA-coated press-fit stem with some bone graft and impaction grafting technique.  We irrigated and then impacted the HA- coated 10 stem with the epi-1 left metaphysis, set on the 0 setting and placed in 10 degrees of retroversion.  Once that was impacted in place, it was very secure.  We placed a 38 plus 3 real poly component and impacted that in place and reduced the shoulder with nice little snap. We had excellent stability.  No gapping with inferior pole or external rotation and no impingement noted, bony or soft tissue, excellent mobility.  Axillary nerve checked and not under too much tension.  We irrigated thoroughly and closed the deltopectoral interval with 0 Vicryl suture followed by 2-0 Vicryl for subcutaneous closure and 4-0 Monocryl for skin.  Steri-Strips applied followed by a sterile dressing.  The patient tolerated the surgery well.     Doran Heater. Veverly Fells, M.D.     SRN/MEDQ  D:  05/26/2017  T:  05/26/2017  Job:  [id number]

## 2017-05-27 LAB — BASIC METABOLIC PANEL
ANION GAP: 7 (ref 5–15)
BUN: 18 mg/dL (ref 6–20)
CHLORIDE: 104 mmol/L (ref 101–111)
CO2: 26 mmol/L (ref 22–32)
Calcium: 8.6 mg/dL — ABNORMAL LOW (ref 8.9–10.3)
Creatinine, Ser: 1.5 mg/dL — ABNORMAL HIGH (ref 0.61–1.24)
GFR calc non Af Amer: 40 mL/min — ABNORMAL LOW (ref >60)
GFR, EST AFRICAN AMERICAN: 47 mL/min — AB (ref >60)
Glucose, Bld: 142 mg/dL — ABNORMAL HIGH (ref 65–99)
POTASSIUM: 4 mmol/L (ref 3.5–5.1)
SODIUM: 137 mmol/L (ref 135–145)

## 2017-05-27 LAB — HEMOGLOBIN AND HEMATOCRIT, BLOOD
HCT: 34 % — ABNORMAL LOW (ref 39.0–52.0)
Hemoglobin: 11 g/dL — ABNORMAL LOW (ref 13.0–17.0)

## 2017-05-27 NOTE — Evaluation (Signed)
Occupational Therapy Evaluation Patient Details Name: Donald Todd MRN: [id number] DOB: 1929-08-04 Today's Date: 05/27/2017    History of Present Illness Left reverse total shoulder arthroplasty    Clinical Impression   This 83 yo male admitted and underwent above presents to acute OT with decreased use of his LUE thus affecting his PLOF of independent. He will benefit from one more session of OT before D/C today for family education.     Follow Up Recommendations  DC plan and follow up therapy as arranged by surgeon;Supervision/Assistance - 24 hour    Equipment Recommendations  None recommended by OT       Precautions / Restrictions Precautions Precautions: Fall;Shoulder Shoulder Interventions: Shoulder sling/immobilizer;For comfort (on to sleep in) Precaution Comments: A/PROM shoulder FF (up to 90 degrees), shoulder abduction (up to 60 degrees); external rotation (up to 30 degrees) Restrictions Weight Bearing Restrictions: Yes LUE Weight Bearing:  (ok for limited WB for balance and mobility)      Mobility Bed Mobility               General bed mobility comments: Pt sitting EOB upon arrival  Transfers Overall transfer level: Needs assistance Equipment used: Straight cane Transfers: Sit to/from Stand Sit to Stand: Min assist         General transfer comment: min A to ambulate with SPC    Balance Overall balance assessment: Needs assistance Sitting-balance support: Feet supported;No upper extremity supported Sitting balance-Leahy Scale: Good     Standing balance support: Single extremity supported Standing balance-Leahy Scale: Poor Standing balance comment: reliant on SPC                           ADL either performed or assessed with clinical judgement         Vision Patient Visual Report: No change from baseline              Pertinent Vitals/Pain Pain Assessment: 0-10 Pain Score: 1  Pain Location: lower arm (left) Pain  Descriptors / Indicators: Sore Pain Intervention(s): Repositioned;Monitored during session     Hand Dominance Right   Extremity/Trunk Assessment Upper Extremity Assessment Upper Extremity Assessment: LUE deficits/detail LUE Deficits / Details: shoulder sx this admission; elbow a little stiff as well, rest of arm WNL for AROM LUE Coordination: decreased gross motor           Communication Communication Communication: No difficulties   Cognition Arousal/Alertness: Awake/alert Behavior During Therapy: WFL for tasks assessed/performed Overall Cognitive Status: Within Functional Limits for tasks assessed                                        Exercises Other Exercises Other Exercises: Pt completed 10 reps each of AROM elbow flexion/extension, forearm supination/pronation, wrist flexion/extension, composite digit flexion/extension.    Shoulder Instructions Shoulder Instructions ROM for elbow, wrist and digits of operated UE: Bracey expects to be discharged to:: Private residence Living Arrangements: Spouse/significant other Available Help at Discharge: Family;Available 24 hours/day Type of Home: House             Bathroom Shower/Tub: Walk-in shower         Home Equipment: Kasandra Knudsen - single point;Shower seat          Prior Functioning/Environment Level of Independence: Independent        Comments:  sometimes used a cane        OT Problem List: Decreased strength;Pain;Impaired UE functional use;Impaired balance (sitting and/or standing)      OT Treatment/Interventions: Self-care/ADL training;Therapeutic exercise;Patient/family education;Balance training    OT Goals(Current goals can be found in the care plan section) Acute Rehab OT Goals Patient Stated Goal: to go home today OT Goal Formulation: With patient Time For Goal Achievement: 05/28/17 Potential to Achieve Goals: Good  OT Frequency: Min  2X/week              AM-PAC PT "6 Clicks" Daily Activity     Outcome Measure Help from another person eating meals?: None Help from another person taking care of personal grooming?: A Little Help from another person toileting, which includes using toliet, bedpan, or urinal?: A Lot Help from another person bathing (including washing, rinsing, drying)?: A Lot Help from another person to put on and taking off regular upper body clothing?: A Lot Help from another person to put on and taking off regular lower body clothing?: A Lot 6 Click Score: 15   End of Session Equipment Utilized During Treatment: Gait belt [facility]) Nurse Communication: Mobility status  Activity Tolerance: Patient tolerated treatment well Patient left: in chair;with call bell/phone within reach  OT Visit Diagnosis: Unsteadiness on feet (R26.81);Pain Pain - Right/Left: Left Pain - part of body:  (lower arm)                Time: 9470-9628 OT Time Calculation (min): 24 min Charges:  OT General Charges $OT Visit: 1 Procedure OT Evaluation $OT Eval Moderate Complexity: 1 Procedure OT Treatments $Therapeutic Exercise: 8-22 mins Golden Circle, OTR/L [phone] 05/27/2017

## 2017-05-27 NOTE — Progress Notes (Signed)
    Subjective: 1 Day Post-Op Procedure(s) (LRB): REVERSE LEFT TOTAL SHOULDER ARTHROPLASTY (Left) Patient reports pain as 2 on 0-10 scale.   Denies CP or SOB.  Voiding without difficulty. Positive flatus. Objective: Vital signs in last 24 hours: Temp:  [96.9 F (36.1 C)-99.6 F (37.6 C)] 98.6 F (37 C) (06/23 0500) Pulse Rate:  [59-81] 71 (06/23 0500) Resp:  [12-21] 18 (06/23 0500) BP: (117-183)/(71-92) 163/83 (06/23 0500) SpO2:  [95 %-100 %] 95 % (06/23 0500) FiO2 (%):  [21 %] 21 % (06/22 1058)  Intake/Output from previous day: 06/22 0701 - 06/23 0700 In: 700 [I.V.:700] Out: 75 [Blood:75] Intake/Output this shift: No intake/output data recorded.  Labs:  Recent Labs  05/27/17 0237  HGB 11.0*    Recent Labs  05/27/17 0237  HCT 34.0*    Recent Labs  05/27/17 0237  NA 137  K 4.0  CL 104  CO2 26  BUN 18  CREATININE 1.50*  GLUCOSE 142*  CALCIUM 8.6*   No results for input(s): LABPT, INR in the last 72 hours.  Physical Exam: Neurologically intact ABD soft Intact pulses distally Incision: dressing C/D/I Compartment soft  Assessment/Plan: 1 Day Post-Op Procedure(s) (LRB): REVERSE LEFT TOTAL SHOULDER ARTHROPLASTY (Left) Advance diet Up with therapy  Plan on d/c to home today  Jamela Cumbo D for Dr. Melina Schools [facility] [phone] 05/27/2017, 7:25 AM

## 2017-05-27 NOTE — Progress Notes (Addendum)
Occupational Therapy Treatment and Discharge Patient Details Name: Donald Todd MRN: [id number] DOB: [birth year] Today's Date: 05/27/2017    History of present illness Left reverse total shoulder arthroplasty    OT comments  This 83 yo male seen a second time today with family present (wife and dtr) for family ed, all ed completed, we will D/C from acute OT. Pt reports he is a little shaky (thinks it may be due to pain meds). Made family aware that they need to have their hands on him when he is up and about due to this)  Follow Up Recommendations  DC plan and follow up therapy as arranged by surgeon;Supervision/Assistance - 24 hour    Equipment Recommendations  None recommended by OT       Precautions / Restrictions Precautions Precautions: Fall;Shoulder Shoulder Interventions: Shoulder sling/immobilizer;For comfort (on to sleep in) Precaution Comments: A/PROM shoulder FF (up to 90 degrees), shoulder abduction (up to 60 degrees); external rotation (up to 30 degrees) Restrictions Weight Bearing Restrictions: Yes LUE Weight Bearing:  ([place] for limited [place] for balacne and mobility)       Mobility Bed Mobility               General bed mobility comments: Pt sitting in recliner upon arrival  Transfers Overall transfer level: Needs assistance Equipment used: Straight cane Transfers: Sit to/from Stand Sit to Stand: Min assist            Balance Overall balance assessment: Needs assistance Sitting-balance support: Feet supported;No upper extremity supported Sitting balance-Leahy Scale: Good     Standing balance support: Single extremity supported Standing balance-Leahy Scale: Poor Standing balance comment: reliant on SPC                           ADL either performed or assessed with clinical judgement        Vision Patient Visual Report: No change from baseline            Cognition Arousal/Alertness: Awake/alert Behavior During Therapy:  WFL for tasks assessed/performed Overall Cognitive Status: Within Functional Limits for tasks assessed                                          Exercises Other Exercises Other Exercises: Pt completed 10 reps each of AROM elbow flexion/extension, forearm supination/pronation, wrist flexion/extension, composite digit flexion/extension. pt using his LUE functionally for basic ADLs. AROM he can currently get ~10 degrees shoulder flexion (demonsrated to family how they can A him with this); ~40 degrees of shoulder abduction; external rotation almost to neutral   Shoulder Instructions Shoulder Instructions Donning/doffing shirt without moving shoulder:  (caregiver verbalized understanding) Method for sponge bathing under operated UE:  (pt and caregiver verbalized understanding) Donning/doffing sling/immobilizer: Caregiver independent with task Correct positioning of sling/immobilizer: Caregiver independent with task Pendulum exercises (written home exercise program):  (NA) ROM for elbow, wrist and digits of operated UE: Supervision/safety Sling wearing schedule (on at all times/off for ADL's):  (pt and caregiver verbalized understanding) Proper positioning of operated UE when showering:  (pt and caregiver verbalized understanding) Dressing change:  (NA) Positioning of UE while sleeping:  (pt and caregiver verbalized understanding)          Pertinent Vitals/ Pain       Pain Assessment: No/denies pain Pain Score: 1  Pain Location: lower arm (left)  Pain Descriptors / Indicators: Sore Pain Intervention(s): Repositioned;Monitored during session         Frequency  Min 2X/week        Progress Toward Goals  OT Goals(current goals can now be found in the care plan section)  Progress towards OT goals:  (all education completed)  Acute Rehab OT Goals Patient Stated Goal: to go home today OT Goal Formulation: With patient Time For Goal Achievement: 05/28/17 Potential  to Achieve Goals: Good ADL Goals Pt/caregiver will Perform Home Exercise Program: Left upper extremity;With Supervision;With minimal assist;With written HEP provided Additional ADL Goal #1: Pt/caregiver will understand and/or demonstrate UB basic self care and positioning  Plan Discharge plan remains appropriate       AM-PAC PT "6 Clicks" Daily Activity     Outcome Measure   Help from another person eating meals?: None Help from another person taking care of personal grooming?: A Little Help from another person toileting, which includes using toliet, bedpan, or urinal?: A Lot Help from another person bathing (including washing, rinsing, drying)?: A Lot Help from another person to put on and taking off regular upper body clothing?: A Lot Help from another person to put on and taking off regular lower body clothing?: A Lot 6 Click Score: 15    End of Session Equipment Utilized During Treatment: Gait belt (SPC)  OT Visit Diagnosis: Unsteadiness on feet (R26.81);Pain Pain - Right/Left: Left Pain - part of body:  (lower left arm)   Activity Tolerance Patient tolerated treatment well   Patient Left in chair;with call bell/phone within reach   Nurse Communication  (pt ready to go from OT standpoint)        Time: 0940-1004 OT Time Calculation (min): 24 min  Charges: OT General Charges $OT Visit: 1 Procedure OT Evaluation $OT Eval Moderate Complexity: 1 Procedure OT Treatments $Self Care/Home Management : 8-22 mins $Therapeutic Exercise: 8-22 mins  Golden Circle, OTR/L [phone] 05/27/2017

## 2017-05-27 NOTE — Progress Notes (Signed)
Patient alert and oriented, mae's well, voiding adequate amount of urine, swallowing without difficulty, no c/o pain at time of discharge. Patient discharged home with family. Script and discharged instructions given to patient. Patient and family stated understanding of instructions given. Patient has an appointment with Dr. Norris 

## 2017-05-27 NOTE — Anesthesia Postprocedure Evaluation (Signed)
Anesthesia Post Note  Patient: Donald Todd  Procedure(s) Performed: Procedure(s) (LRB): REVERSE LEFT TOTAL SHOULDER ARTHROPLASTY (Left)     Patient location during evaluation: PACU Anesthesia Type: General Level of consciousness: awake, awake and alert and oriented Pain management: pain level controlled Vital Signs Assessment: post-procedure vital signs reviewed and stable Respiratory status: spontaneous breathing, nonlabored ventilation and respiratory function stable Cardiovascular status: blood pressure returned to baseline Postop Assessment: no headache Anesthetic complications: no    Last Vitals:  Vitals:   05/27/17 0500 05/27/17 0737  BP: (!) 163/83 (!) 170/84  Pulse: 71 61  Resp: 18 18  Temp: 37 C 37 C    Last Pain:  Vitals:   05/27/17 0737  TempSrc: Oral  PainSc:                  Elektra Wartman COKER

## 2017-05-27 NOTE — Anesthesia Postprocedure Evaluation (Signed)
Anesthesia Post Note  Patient: Donald Todd  Procedure(s) Performed: Procedure(s) (LRB): REVERSE LEFT TOTAL SHOULDER ARTHROPLASTY (Left)     Patient location during evaluation: Nursing Unit Anesthesia Type: General Level of consciousness: awake, awake and alert and oriented Pain management: pain level controlled Vital Signs Assessment: post-procedure vital signs reviewed and stable Respiratory status: spontaneous breathing, nonlabored ventilation and respiratory function stable Cardiovascular status: blood pressure returned to baseline Anesthetic complications: no    Last Vitals:  Vitals:   05/27/17 0500 05/27/17 0737  BP: (!) 163/83 (!) 170/84  Pulse: 71 61  Resp: 18 18  Temp: 37 C 37 C    Last Pain:  Vitals:   05/27/17 0737  TempSrc: Oral  PainSc:                  Eran Mistry COKER

## 2017-05-29 ENCOUNTER — Encounter (HOSPITAL_COMMUNITY): Payer: Self-pay | Admitting: Orthopedic Surgery

## 2017-05-30 NOTE — Discharge Summary (Signed)
Physician Discharge Summary   Patient ID: Donald Todd MRN: [id number] DOB/AGE: 81-Aug-1930 81 y.o.  Admit date: 05/26/2017 Discharge date: 05/27/17  Admission Diagnoses:  Active Problems:   S/P shoulder replacement, left   Discharge Diagnoses:  Same   Surgeries: Procedure(s): REVERSE LEFT TOTAL SHOULDER ARTHROPLASTY on 05/26/2017   Consultants: PT/OT  Discharged Condition: Stable  Hospital Course: Donald Todd is an 83 y.o. male who was admitted 05/26/2017 with a chief complaint of left shoulder pain , and found to have a diagnosis of left shoulder cuff arthropathy.  They were brought to the operating room on 05/26/2017 and underwent the above named procedures.    The patient had an uncomplicated hospital course and was stable for discharge.  Recent vital signs:  Vitals:   05/27/17 0500 05/27/17 0737  BP: (!) 163/83 (!) 170/84  Pulse: 71 61  Resp: 18 18  Temp: 98.6 F (37 C) 98.6 F (37 C)    Recent laboratory studies:  Results for orders placed or performed during the hospital encounter of 05/26/17  Hemoglobin and hematocrit, blood  Result Value Ref Range   Hemoglobin 11.0 (L) 13.0 - 17.0 g/dL   HCT 34.0 (L) 39.0 - 50.9 %  Basic metabolic panel  Result Value Ref Range   Sodium 137 135 - 145 mmol/L   Potassium 4.0 3.5 - 5.1 mmol/L   Chloride 104 101 - 111 mmol/L   CO2 26 22 - 32 mmol/L   Glucose, Bld 142 (H) 65 - 99 mg/dL   BUN 18 6 - 20 mg/dL   Creatinine, Ser 1.50 (H) 0.61 - 1.24 mg/dL   Calcium 8.6 (L) 8.9 - 10.3 mg/dL   GFR calc non Af Amer 40 (L) >60 mL/min   GFR calc Af Amer 47 (L) >60 mL/min   Anion gap 7 5 - 15    Discharge Medications:   Allergies as of 05/27/2017      Reactions   Tape Hives   Surgical tape - causes blisters   Hydrocodone    Iohexol     Desc: PT DOES RECALL REACTION JUST SAYS IT WAS A LONG TIME AGO FOR KIDNEY X-RAYS AND HE DIDN'T TOLERATE IT WELL-ARS 05/02/09-NOTE E-CHART STATES A HOT FEELING IS HIS REACTION, BUT HE CAN'T  CONFIRM THAT WAS ALL   Oxycodone Other (See Comments)   Shaking uncontrollably       Medication List    TAKE these medications   amLODipine 2.5 MG tablet Commonly known as:  NORVASC Take 2 tablets (5 mg total) by mouth every morning.   amoxicillin 500 MG capsule Commonly known as:  AMOXIL Take 2,000 mg by mouth as needed. One hour before dental procedure   aspirin 81 MG tablet Take 81 mg by mouth daily.   atorvastatin 40 MG tablet Commonly known as:  LIPITOR TAKE 1 TABLET AT BEDTIME   cholecalciferol 1000 units tablet Commonly known as:  VITAMIN D Take 1,000 Units by mouth daily.   enalapril 10 MG tablet Commonly known as:  VASOTEC Take 1 tablet (10 mg total) by mouth at bedtime.   hydrOXYzine 25 MG tablet Commonly known as:  ATARAX/VISTARIL Take 25 mg by mouth every evening.   isosorbide mononitrate 30 MG 24 hr tablet Commonly known as:  IMDUR Take 2 tablets (60 mg total) by mouth every morning.   loratadine 10 MG tablet Commonly known as:  CLARITIN Take 10 mg by mouth daily.   metoprolol succinate 25 MG 24 hr tablet Commonly known as:  TOPROL XL Take 1 tablet (25 mg total) by mouth every morning.   nitroGLYCERIN 0.4 MG SL tablet Commonly known as:  NITROSTAT Place 1 tablet (0.4 mg total) under the tongue as needed.   OCUVITE PO Take 1 capsule by mouth 2 (two) times daily.   omeprazole 20 MG capsule Commonly known as:  PRILOSEC TAKE 1 CAPSULE DAILY   PROBIOTIC PO Take 1 capsule by mouth daily.   traMADol 50 MG tablet Commonly known as:  ULTRAM Take 1-2 tablets (50-100 mg total) by mouth every 6 (six) hours as needed for moderate pain.       Diagnostic Studies: Dg Shoulder Left Port  Result Date: 05/26/2017 CLINICAL DATA:  Status post total shoulder replacement EXAM: LEFT SHOULDER - 1 VIEW COMPARISON:  None. FINDINGS: Frontal view obtained. There is a total shoulder replacement with prosthetic components well-seated. No acute fracture or  dislocation. There is osteoarthritic change in the acromioclavicular joint. There is air within the joint, an expected postoperative finding. IMPRESSION: Status post total shoulder replacement with prosthetic components well-seated. No acute fracture or dislocation. 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 Electronically Signed   By: Lowella Grip III M.D.   On: 05/26/2017 10:19    Disposition: 01-Home or Self Care    Follow-up Information    Netta Cedars, MD. Call in 2 week(s).   Specialty:  Orthopedic Surgery Why:  [phone] Contact information: [address] [place] [address] [phone]            Signed: Ventura Bruns 05/30/2017, 8:16 AM

## 2017-06-13 DIAGNOSIS — Z96612 Presence of left artificial shoulder joint: Secondary | ICD-10-CM | POA: Diagnosis not present

## 2017-06-13 DIAGNOSIS — Z471 Aftercare following joint replacement surgery: Secondary | ICD-10-CM | POA: Diagnosis not present

## 2017-07-12 DIAGNOSIS — Z96612 Presence of left artificial shoulder joint: Secondary | ICD-10-CM | POA: Diagnosis not present

## 2017-07-12 DIAGNOSIS — Z471 Aftercare following joint replacement surgery: Secondary | ICD-10-CM | POA: Diagnosis not present

## 2017-07-13 DIAGNOSIS — Z8546 Personal history of malignant neoplasm of prostate: Secondary | ICD-10-CM | POA: Diagnosis not present

## 2017-07-13 DIAGNOSIS — C642 Malignant neoplasm of left kidney, except renal pelvis: Secondary | ICD-10-CM | POA: Diagnosis not present

## 2017-07-13 DIAGNOSIS — N281 Cyst of kidney, acquired: Secondary | ICD-10-CM | POA: Diagnosis not present

## 2017-07-28 DIAGNOSIS — H353232 Exudative age-related macular degeneration, bilateral, with inactive choroidal neovascularization: Secondary | ICD-10-CM | POA: Diagnosis not present

## 2017-07-28 DIAGNOSIS — Z961 Presence of intraocular lens: Secondary | ICD-10-CM | POA: Diagnosis not present

## 2017-07-28 DIAGNOSIS — H35351 Cystoid macular degeneration, right eye: Secondary | ICD-10-CM | POA: Diagnosis not present

## 2017-08-11 ENCOUNTER — Telehealth: Payer: Self-pay

## 2017-08-11 MED ORDER — ISOSORBIDE MONONITRATE ER 30 MG PO TB24: 60.0000 mg | ORAL_TABLET | ORAL | 1 refills | Status: DC

## 2017-08-11 MED ORDER — NITROGLYCERIN 0.4 MG SL SUBL: 0.4000 mg | SUBLINGUAL_TABLET | SUBLINGUAL | 3 refills | Status: DC | PRN

## 2017-08-11 NOTE — Telephone Encounter (Signed)
Pt walked into office stating that [facility] sent him a note that they have cancelled his Imdur because the letter states he is currently taking amlodipine are used for the same thing. Will [facility] to clarify.  Spoke with pharmacist she states that she will fill this and send it asap. She also states that amlodipine was shipped 08-03-17 and if he hasnt received this he should be any day. Pt is also asking for a refill for SL nitro to be sent to local pharmacy. Nitro sent, also sent 30 day rx for imdur #30 so pt has enough to last until he receives mail in rx. Left detailed message explaining this

## 2017-08-23 DIAGNOSIS — Z96612 Presence of left artificial shoulder joint: Secondary | ICD-10-CM | POA: Diagnosis not present

## 2017-08-23 DIAGNOSIS — Z471 Aftercare following joint replacement surgery: Secondary | ICD-10-CM | POA: Diagnosis not present

## 2017-08-24 DIAGNOSIS — H353231 Exudative age-related macular degeneration, bilateral, with active choroidal neovascularization: Secondary | ICD-10-CM | POA: Diagnosis not present

## 2017-08-24 DIAGNOSIS — H35351 Cystoid macular degeneration, right eye: Secondary | ICD-10-CM | POA: Diagnosis not present

## 2017-09-07 DIAGNOSIS — K432 Incisional hernia without obstruction or gangrene: Secondary | ICD-10-CM | POA: Diagnosis not present

## 2017-10-06 DIAGNOSIS — K432 Incisional hernia without obstruction or gangrene: Secondary | ICD-10-CM | POA: Diagnosis not present

## 2017-10-16 DIAGNOSIS — I10 Essential (primary) hypertension: Secondary | ICD-10-CM | POA: Diagnosis not present

## 2017-10-16 DIAGNOSIS — R413 Other amnesia: Secondary | ICD-10-CM | POA: Diagnosis not present

## 2017-10-16 DIAGNOSIS — E7800 Pure hypercholesterolemia, unspecified: Secondary | ICD-10-CM | POA: Diagnosis not present

## 2017-10-16 DIAGNOSIS — Z0000 Encounter for general adult medical examination without abnormal findings: Secondary | ICD-10-CM | POA: Diagnosis not present

## 2017-10-27 ENCOUNTER — Other Ambulatory Visit: Payer: Self-pay | Admitting: Cardiovascular Disease

## 2017-11-03 DIAGNOSIS — Z961 Presence of intraocular lens: Secondary | ICD-10-CM | POA: Diagnosis not present

## 2017-11-03 DIAGNOSIS — H543 Unqualified visual loss, both eyes: Secondary | ICD-10-CM | POA: Diagnosis not present

## 2017-11-03 DIAGNOSIS — H353231 Exudative age-related macular degeneration, bilateral, with active choroidal neovascularization: Secondary | ICD-10-CM | POA: Diagnosis not present

## 2017-11-03 DIAGNOSIS — H353211 Exudative age-related macular degeneration, right eye, with active choroidal neovascularization: Secondary | ICD-10-CM | POA: Diagnosis not present

## 2017-11-08 DIAGNOSIS — D485 Neoplasm of uncertain behavior of skin: Secondary | ICD-10-CM | POA: Diagnosis not present

## 2017-11-08 DIAGNOSIS — C44519 Basal cell carcinoma of skin of other part of trunk: Secondary | ICD-10-CM | POA: Diagnosis not present

## 2017-11-08 DIAGNOSIS — Z85828 Personal history of other malignant neoplasm of skin: Secondary | ICD-10-CM | POA: Diagnosis not present

## 2017-11-08 DIAGNOSIS — L821 Other seborrheic keratosis: Secondary | ICD-10-CM | POA: Diagnosis not present

## 2017-11-08 DIAGNOSIS — Z08 Encounter for follow-up examination after completed treatment for malignant neoplasm: Secondary | ICD-10-CM | POA: Diagnosis not present

## 2017-11-15 DIAGNOSIS — R238 Other skin changes: Secondary | ICD-10-CM | POA: Diagnosis not present

## 2017-11-17 DIAGNOSIS — L989 Disorder of the skin and subcutaneous tissue, unspecified: Secondary | ICD-10-CM | POA: Diagnosis not present

## 2017-12-19 DIAGNOSIS — H353211 Exudative age-related macular degeneration, right eye, with active choroidal neovascularization: Secondary | ICD-10-CM | POA: Diagnosis not present

## 2017-12-19 DIAGNOSIS — H353222 Exudative age-related macular degeneration, left eye, with inactive choroidal neovascularization: Secondary | ICD-10-CM | POA: Diagnosis not present

## 2018-01-03 ENCOUNTER — Other Ambulatory Visit: Payer: Self-pay | Admitting: Cardiovascular Disease

## 2018-01-05 NOTE — Telephone Encounter (Signed)
Rx request sent to pharmacy.  

## 2018-01-16 DIAGNOSIS — H353211 Exudative age-related macular degeneration, right eye, with active choroidal neovascularization: Secondary | ICD-10-CM | POA: Diagnosis not present

## 2018-01-29 ENCOUNTER — Other Ambulatory Visit: Payer: Self-pay | Admitting: Cardiovascular Disease

## 2018-02-13 DIAGNOSIS — H353211 Exudative age-related macular degeneration, right eye, with active choroidal neovascularization: Secondary | ICD-10-CM | POA: Diagnosis not present

## 2018-02-13 DIAGNOSIS — H353222 Exudative age-related macular degeneration, left eye, with inactive choroidal neovascularization: Secondary | ICD-10-CM | POA: Diagnosis not present

## 2018-02-22 DIAGNOSIS — H353211 Exudative age-related macular degeneration, right eye, with active choroidal neovascularization: Secondary | ICD-10-CM | POA: Diagnosis not present

## 2018-02-22 DIAGNOSIS — H353222 Exudative age-related macular degeneration, left eye, with inactive choroidal neovascularization: Secondary | ICD-10-CM | POA: Diagnosis not present

## 2018-04-03 DIAGNOSIS — Z961 Presence of intraocular lens: Secondary | ICD-10-CM | POA: Diagnosis not present

## 2018-04-03 DIAGNOSIS — H353222 Exudative age-related macular degeneration, left eye, with inactive choroidal neovascularization: Secondary | ICD-10-CM | POA: Diagnosis not present

## 2018-04-03 DIAGNOSIS — H353211 Exudative age-related macular degeneration, right eye, with active choroidal neovascularization: Secondary | ICD-10-CM | POA: Diagnosis not present

## 2018-04-06 ENCOUNTER — Other Ambulatory Visit: Payer: Self-pay | Admitting: Cardiovascular Disease

## 2018-04-10 DIAGNOSIS — H02889 Meibomian gland dysfunction of unspecified eye, unspecified eyelid: Secondary | ICD-10-CM | POA: Diagnosis not present

## 2018-04-10 DIAGNOSIS — H353230 Exudative age-related macular degeneration, bilateral, stage unspecified: Secondary | ICD-10-CM | POA: Diagnosis not present

## 2018-04-13 DIAGNOSIS — K432 Incisional hernia without obstruction or gangrene: Secondary | ICD-10-CM | POA: Diagnosis not present

## 2018-05-04 ENCOUNTER — Ambulatory Visit (INDEPENDENT_AMBULATORY_CARE_PROVIDER_SITE_OTHER): Payer: Medicare Other | Admitting: Cardiovascular Disease

## 2018-05-04 ENCOUNTER — Encounter: Payer: Self-pay | Admitting: Cardiovascular Disease

## 2018-05-04 VITALS — BP 145/80 | HR 63 | Ht 67.0 in | Wt 167.0 lb

## 2018-05-04 DIAGNOSIS — E785 Hyperlipidemia, unspecified: Secondary | ICD-10-CM | POA: Diagnosis not present

## 2018-05-04 DIAGNOSIS — Z9861 Coronary angioplasty status: Secondary | ICD-10-CM | POA: Diagnosis not present

## 2018-05-04 DIAGNOSIS — I2510 Atherosclerotic heart disease of native coronary artery without angina pectoris: Secondary | ICD-10-CM | POA: Diagnosis not present

## 2018-05-04 DIAGNOSIS — I10 Essential (primary) hypertension: Secondary | ICD-10-CM | POA: Diagnosis not present

## 2018-05-04 NOTE — Progress Notes (Signed)
05/04/2018 Donald Todd   [birth year]  [id number]  Primary Physician [name], MD Primary Cardiologist: Lorretta Harp MD FACP, [place], [place], [place]  HPI:  Donald Todd is a 85 y.o.  thin-appearing married Caucasian male, father of 54, grandfather to 7 grandchildren, who I last saw in the office  05/03/2017.He has a history of RCA and ramus branch stenting by myself back in 1997. I catheterized him, December 22, 2003, revealing patent stent with a 60% mid LAD lesion, normal LV function. He did have a 40% ostial right renal artery stenosis with a known left nephrectomy. His last Myoview, performed August 04, 2010, revealed lateral scar, and catheterization performed several weeks prior to that revealed patent stents with noncritical CAD. He has remained asymptomatic. His last .Myoview stress test performed 02/15/13 moderate sized lateral wall scar consistent with his anatomy. He saw [name] in the office 04/03/17 after a syncopal episode that occurred while he was bending over and running up. He's had a workup including an MRI of his brain which is unremarkable. He did injure his left shoulder and had this surgically addressed. Since I saw him a year ago he is remained stable.  He has fallen a number of times and now uses a cane when he walks.  He seems to be unstable on his feet.  Denies chest pain or shortness of breath.     Current Meds  Medication Sig  . amLODipine (NORVASC) 2.5 MG tablet Take 2 tablets (5 mg total) by mouth every morning.  [name] amoxicillin (AMOXIL) 500 MG capsule Take 2,000 mg by mouth as needed. One hour before dental procedure  . aspirin 81 MG tablet Take 81 mg by mouth daily.   [name] atorvastatin (LIPITOR) 40 MG tablet TAKE 1 TABLET AT BEDTIME  . enalapril (VASOTEC) 10 MG tablet Take 1 tablet (10 mg total) by mouth at bedtime.  . hydrOXYzine (ATARAX/VISTARIL) 25 MG tablet Take 25 mg by mouth every evening.  . isosorbide mononitrate (IMDUR) 30 MG 24 hr  tablet Take 2 tablets (60 mg total) by mouth every morning.  . loratadine (CLARITIN) 10 MG tablet Take 10 mg by mouth daily.  . metoprolol succinate (TOPROL-XL) 25 MG 24 hr tablet TAKE 1 TABLET TWICE A DAY  . Multiple Vitamins-Minerals (OCUVITE PO) Take 1 capsule by mouth 2 (two) times daily.   . nitroGLYCERIN (NITROSTAT) 0.4 MG SL tablet Place 1 tablet (0.4 mg total) under the tongue as needed.  [name] omeprazole (PRILOSEC) 20 MG capsule TAKE 1 CAPSULE DAILY  . Probiotic Product (PROBIOTIC PO) Take 1 capsule by mouth daily.   . traMADol (ULTRAM) 50 MG tablet Take 1-2 tablets (50-100 mg total) by mouth every 6 (six) hours as needed for moderate pain.  . [DISCONTINUED] amLODipine (NORVASC) 2.5 MG tablet TAKE 3 TABLETS DAILY  . [DISCONTINUED] cholecalciferol (VITAMIN D) 1000 units tablet Take 1,000 Units by mouth daily.  . [DISCONTINUED] enalapril (VASOTEC) 10 MG tablet TAKE 1 TABLET DAILY     Allergies  Allergen Reactions  . Tape Hives    Surgical tape - causes blisters  . Hydrocodone   . Iohexol      Desc: PT DOES RECALL REACTION JUST SAYS IT WAS A LONG TIME AGO FOR KIDNEY X-RAYS AND HE DIDN'T TOLERATE IT WELL-ARS 05/02/09-NOTE E-CHART STATES A HOT FEELING IS HIS REACTION, BUT HE CAN'T CONFIRM THAT WAS ALL   . Oxycodone Other (See Comments)    Shaking uncontrollably  Social History   Socioeconomic History  . Marital status: Married    Spouse name: Not on file  . Number of children: 5  . Years of education: 41  . Highest education level: Not on file  Occupational History  . Occupation: Retired    Comment: Firefighter, Marine scientist  Social Needs  . Financial resource strain: Not on file  . Food insecurity:    Worry: Not on file    Inability: Not on file  . Transportation needs:    Medical: Not on file    Non-medical: Not on file  Tobacco Use  . Smoking status: Former Smoker    Types: Cigarettes, Cigars    Last attempt to quit: 12/14/1963    Years since quitting: 54.4  .  Smokeless tobacco: Never Used  Substance and Sexual Activity  . Alcohol use: No  . Drug use: No  . Sexual activity: Not on file  Lifestyle  . Physical activity:    Days per week: Not on file    Minutes per session: Not on file  . Stress: Not on file  Relationships  . Social connections:    Talks on phone: Not on file    Gets together: Not on file    Attends religious service: Not on file    Active member of club or organization: Not on file    Attends meetings of clubs or organizations: Not on file    Relationship status: Not on file  . Intimate partner violence:    Fear of current or ex partner: Not on file    Emotionally abused: Not on file    Physically abused: Not on file    Forced sexual activity: Not on file  Other Topics Concern  . Not on file  Social History Narrative   Lives at home w/ his wife   Right-handed   Caffeine: 2 cups of coffee per day, rare soft drink     Review of Systems: General: negative for chills, fever, night sweats or weight changes.  Cardiovascular: negative for chest pain, dyspnea on exertion, edema, orthopnea, palpitations, paroxysmal nocturnal dyspnea or shortness of breath Dermatological: negative for rash Respiratory: negative for cough or wheezing Urologic: negative for hematuria Abdominal: negative for nausea, vomiting, diarrhea, bright red blood per rectum, melena, or hematemesis Neurologic: negative for visual changes, syncope, or dizziness All other systems reviewed and are otherwise negative except as noted above.    Blood pressure (!) 145/80, pulse 63, height 5\' 7"  (1.702 m), weight 167 lb (75.8 kg).  General appearance: alert and no distress Neck: no adenopathy, no carotid bruit, no JVD, supple, symmetrical, trachea midline and thyroid not enlarged, symmetric, no tenderness/mass/nodules Lungs: clear to auscultation bilaterally Heart: regular rate and rhythm, S1, S2 normal, no murmur, click, rub or gallop Extremities:  extremities normal, atraumatic, no cyanosis or edema Pulses: 2+ and symmetric Skin: Skin color, texture, turgor normal. No rashes or lesions Neurologic: Alert and oriented X 3, normal strength and tone. Normal symmetric reflexes. Normal coordination and gait  EKG normal sinus rhythm at 63 with left anterior fascicular block and nonspecific ST and T wave changes.  I personally reviewed this EKG.  ASSESSMENT AND PLAN:   Essential hypertension History of essential hypertension her blood pressure measured today at 145/80.  He is on amlodipine, metoprolol and enalapril.  Continue current meds at current dosing.  CAD S/P percutaneous coronary angioplasty History of CAD status post RCA and ramus branch stenting by myself back in 1997.  Was recath 12/22/2003 found to have patent stents with a 60% mid LAD and normal LV function.  He had another cath performed August 2011 showed unchanged anatomy.  He denies chest pain or shortness of breath.  Dyslipidemia History of dyslipidemia on statin therapy.      Lorretta Harp MD FACP,FACC,FAHA, [facility] 05/04/2018 11:06 AM

## 2018-05-04 NOTE — Assessment & Plan Note (Signed)
History of dyslipidemia on statin therapy 

## 2018-05-04 NOTE — Assessment & Plan Note (Signed)
History of essential hypertension her blood pressure measured today at 145/80.  He is on amlodipine, metoprolol and enalapril.  Continue current meds at current dosing.

## 2018-05-04 NOTE — Assessment & Plan Note (Signed)
History of CAD status post RCA and ramus branch stenting by myself back in 1997.  Was recath 12/22/2003 found to have patent stents with a 60% mid LAD and normal LV function.  He had another cath performed August 2011 showed unchanged anatomy.  He denies chest pain or shortness of breath.

## 2018-05-04 NOTE — Patient Instructions (Signed)

## 2018-05-22 DIAGNOSIS — H541151 Blindness right eye category 5, low vision left eye category 1: Secondary | ICD-10-CM | POA: Diagnosis not present

## 2018-05-22 DIAGNOSIS — H353211 Exudative age-related macular degeneration, right eye, with active choroidal neovascularization: Secondary | ICD-10-CM | POA: Diagnosis not present

## 2018-05-25 DIAGNOSIS — H353222 Exudative age-related macular degeneration, left eye, with inactive choroidal neovascularization: Secondary | ICD-10-CM | POA: Diagnosis not present

## 2018-05-25 DIAGNOSIS — H353211 Exudative age-related macular degeneration, right eye, with active choroidal neovascularization: Secondary | ICD-10-CM | POA: Diagnosis not present

## 2018-05-25 DIAGNOSIS — Z961 Presence of intraocular lens: Secondary | ICD-10-CM | POA: Diagnosis not present

## 2018-05-25 DIAGNOSIS — H35371 Puckering of macula, right eye: Secondary | ICD-10-CM | POA: Diagnosis not present

## 2018-05-31 DIAGNOSIS — H353222 Exudative age-related macular degeneration, left eye, with inactive choroidal neovascularization: Secondary | ICD-10-CM | POA: Diagnosis not present

## 2018-05-31 DIAGNOSIS — H353211 Exudative age-related macular degeneration, right eye, with active choroidal neovascularization: Secondary | ICD-10-CM | POA: Diagnosis not present

## 2018-05-31 DIAGNOSIS — H35371 Puckering of macula, right eye: Secondary | ICD-10-CM | POA: Diagnosis not present

## 2018-06-13 DIAGNOSIS — H35371 Puckering of macula, right eye: Secondary | ICD-10-CM | POA: Diagnosis not present

## 2018-06-13 DIAGNOSIS — H353222 Exudative age-related macular degeneration, left eye, with inactive choroidal neovascularization: Secondary | ICD-10-CM | POA: Diagnosis not present

## 2018-06-13 DIAGNOSIS — H353211 Exudative age-related macular degeneration, right eye, with active choroidal neovascularization: Secondary | ICD-10-CM | POA: Diagnosis not present

## 2018-06-25 DIAGNOSIS — M9901 Segmental and somatic dysfunction of cervical region: Secondary | ICD-10-CM | POA: Diagnosis not present

## 2018-06-25 DIAGNOSIS — H8149 Vertigo of central origin, unspecified ear: Secondary | ICD-10-CM | POA: Diagnosis not present

## 2018-06-25 DIAGNOSIS — M62830 Muscle spasm of back: Secondary | ICD-10-CM | POA: Diagnosis not present

## 2018-06-25 DIAGNOSIS — M5413 Radiculopathy, cervicothoracic region: Secondary | ICD-10-CM | POA: Diagnosis not present

## 2018-06-27 DIAGNOSIS — H60542 Acute eczematoid otitis externa, left ear: Secondary | ICD-10-CM | POA: Diagnosis not present

## 2018-06-27 DIAGNOSIS — H6122 Impacted cerumen, left ear: Secondary | ICD-10-CM | POA: Diagnosis not present

## 2018-07-05 ENCOUNTER — Other Ambulatory Visit: Payer: Self-pay | Admitting: Cardiovascular Disease

## 2018-07-05 NOTE — Telephone Encounter (Signed)
Rx sent to pharmacy   

## 2018-07-06 DIAGNOSIS — H6192 Disorder of left external ear, unspecified: Secondary | ICD-10-CM | POA: Diagnosis not present

## 2018-07-09 ENCOUNTER — Other Ambulatory Visit: Payer: Self-pay | Admitting: Cardiovascular Disease

## 2018-07-12 DIAGNOSIS — H353222 Exudative age-related macular degeneration, left eye, with inactive choroidal neovascularization: Secondary | ICD-10-CM | POA: Diagnosis not present

## 2018-07-12 DIAGNOSIS — H353211 Exudative age-related macular degeneration, right eye, with active choroidal neovascularization: Secondary | ICD-10-CM | POA: Diagnosis not present

## 2018-07-12 DIAGNOSIS — Z961 Presence of intraocular lens: Secondary | ICD-10-CM | POA: Diagnosis not present

## 2018-08-06 ENCOUNTER — Other Ambulatory Visit: Payer: Self-pay | Admitting: Cardiovascular Disease

## 2018-08-07 NOTE — Telephone Encounter (Signed)
Rx sent to pharmacy   

## 2018-08-29 DIAGNOSIS — H60311 Diffuse otitis externa, right ear: Secondary | ICD-10-CM | POA: Diagnosis not present

## 2018-08-29 DIAGNOSIS — R42 Dizziness and giddiness: Secondary | ICD-10-CM | POA: Diagnosis not present

## 2018-08-29 DIAGNOSIS — H6192 Disorder of left external ear, unspecified: Secondary | ICD-10-CM | POA: Diagnosis not present

## 2018-09-06 DIAGNOSIS — H353222 Exudative age-related macular degeneration, left eye, with inactive choroidal neovascularization: Secondary | ICD-10-CM | POA: Diagnosis not present

## 2018-09-06 DIAGNOSIS — H353211 Exudative age-related macular degeneration, right eye, with active choroidal neovascularization: Secondary | ICD-10-CM | POA: Diagnosis not present

## 2018-09-06 DIAGNOSIS — Z961 Presence of intraocular lens: Secondary | ICD-10-CM | POA: Diagnosis not present

## 2018-09-08 ENCOUNTER — Encounter (HOSPITAL_BASED_OUTPATIENT_CLINIC_OR_DEPARTMENT_OTHER): Payer: Self-pay | Admitting: *Deleted

## 2018-09-08 ENCOUNTER — Emergency Department (HOSPITAL_BASED_OUTPATIENT_CLINIC_OR_DEPARTMENT_OTHER): Payer: Medicare Other

## 2018-09-08 ENCOUNTER — Other Ambulatory Visit: Payer: Self-pay

## 2018-09-08 ENCOUNTER — Emergency Department (HOSPITAL_BASED_OUTPATIENT_CLINIC_OR_DEPARTMENT_OTHER)
Admission: EM | Admit: 2018-09-08 | Discharge: 2018-09-08 | Disposition: A | Discharge status: Home / Self Care | Payer: Medicare Other | Attending: Emergency Medicine | Admitting: Emergency Medicine

## 2018-09-08 DIAGNOSIS — W108XXA Fall (on) (from) other stairs and steps, initial encounter: Secondary | ICD-10-CM | POA: Insufficient documentation

## 2018-09-08 DIAGNOSIS — Y9301 Activity, walking, marching and hiking: Secondary | ICD-10-CM | POA: Insufficient documentation

## 2018-09-08 DIAGNOSIS — Z7982 Long term (current) use of aspirin: Secondary | ICD-10-CM | POA: Insufficient documentation

## 2018-09-08 DIAGNOSIS — Z96612 Presence of left artificial shoulder joint: Secondary | ICD-10-CM | POA: Diagnosis not present

## 2018-09-08 DIAGNOSIS — Z955 Presence of coronary angioplasty implant and graft: Secondary | ICD-10-CM | POA: Diagnosis not present

## 2018-09-08 DIAGNOSIS — S0990XA Unspecified injury of head, initial encounter: Secondary | ICD-10-CM | POA: Diagnosis not present

## 2018-09-08 DIAGNOSIS — I2510 Atherosclerotic heart disease of native coronary artery without angina pectoris: Secondary | ICD-10-CM | POA: Diagnosis not present

## 2018-09-08 DIAGNOSIS — Z79899 Other long term (current) drug therapy: Secondary | ICD-10-CM | POA: Diagnosis not present

## 2018-09-08 DIAGNOSIS — N183 Chronic kidney disease, stage 3 (moderate): Secondary | ICD-10-CM | POA: Insufficient documentation

## 2018-09-08 DIAGNOSIS — I129 Hypertensive chronic kidney disease with stage 1 through stage 4 chronic kidney disease, or unspecified chronic kidney disease: Secondary | ICD-10-CM | POA: Diagnosis not present

## 2018-09-08 DIAGNOSIS — Y92008 Other place in unspecified non-institutional (private) residence as the place of occurrence of the external cause: Secondary | ICD-10-CM | POA: Insufficient documentation

## 2018-09-08 DIAGNOSIS — Y999 Unspecified external cause status: Secondary | ICD-10-CM | POA: Diagnosis not present

## 2018-09-08 DIAGNOSIS — Z8546 Personal history of malignant neoplasm of prostate: Secondary | ICD-10-CM | POA: Insufficient documentation

## 2018-09-08 DIAGNOSIS — S0181XA Laceration without foreign body of other part of head, initial encounter: Secondary | ICD-10-CM | POA: Insufficient documentation

## 2018-09-08 DIAGNOSIS — W19XXXA Unspecified fall, initial encounter: Secondary | ICD-10-CM

## 2018-09-08 DIAGNOSIS — Z85528 Personal history of other malignant neoplasm of kidney: Secondary | ICD-10-CM | POA: Diagnosis not present

## 2018-09-08 NOTE — Discharge Instructions (Signed)
The glue will peel off when ready.  You can shower like normal and the area can get wet.  Just do not scrub or soak.  Take it easy for the next few days also and get plenty of rest.

## 2018-09-08 NOTE — ED Provider Notes (Signed)
[facility] [place] EMERGENCY DEPARTMENT Provider Note   CSN: [id number] Arrival date & time: 09/08/18  1449     History   Chief Complaint Chief Complaint  Patient presents with  . Fall    HPI Donald Todd is a 84 y.o. male.  Patient is an 84 year old male with a history of macular degeneration, hypertension, coronary artery disease status post stents, chronic kidney disease presenting today after a mechanical fall.  Patient was touring a new house and was coming down the stairs and missed the last step causing him to fall forward hitting his face on the hardwood floor.  He states for a few seconds his vision was dark but he never lost consciousness.  This happened approximately 15 minutes before arrival.  Currently he has a mild headache where he hit his head but denies any change in vision, neck pain, hip, shoulder, chest or abdominal pain.  He denies any shortness of breath, presyncopal symptoms or palpitations before the fall.  He takes aspirin but no other anticoagulation.  Tetanus shot was updated in 2017.  The history is provided by the patient.    Past Medical History:  Diagnosis Date  . Arthritis   . CAD (coronary artery disease)    last cath. 07-09-2010  . Cancer [facility])    kidney cancer , prostate cancer   . Chronic kidney disease    lt kidnet removed  . H/O cardiovascular stress test 08-04-2010   ef 55% NO ISCHEMIA  . H/O unilateral nephrectomy    lt. nephrectomy  . Heart attack ([place]) 1993  . History of kidney stones   . Hyperlipemia   . Hypertension    renal dopplers 218.13-normal patency  . Inguinal hernia 08/01/11   right  . Macular degeneration   . Prostate disease    Cancer S/P seed implant  . Vertigo     Patient Active Problem List   Diagnosis Date Noted  . S/P shoulder replacement, left 05/26/2017  . Syncope and collapse 04/03/2017  . Abnormality of gait 09/07/2016  . History of nephrectomy- Lt 06/16/2014  . Chronic renal impairment, stage 3  (moderate) ([place]) 06/16/2014  . CAD S/P percutaneous coronary angioplasty 05/15/2013  . Essential hypertension 05/15/2013  . Dyslipidemia 05/15/2013    Past Surgical History:  Procedure Laterality Date  . ANGIOPLASTY  1993  . athroscopic knee surgery  2002   right  . bone scan  2005  . CARDIAC CATHETERIZATION  07-09-2010   normal left ventricular function, coronary obsstructive disease with 20% proximal an 30-40% mid lt. anterior descending narrowing ; widely patent stent in the proximal ramus intermediate vessel; 50-70-% narrowing in the mid circumflex,and widely patent stent  in the rt. coronary   . CARDIAC CATHETERIZATION  12-22-2003   widely patent stents with noncritical coronary artery disease and normalLV function  . CHOLECYSTECTOMY  05/05/2009   Laparoscopic  . colonscopy  2004   with biopsy  . CORONARY ANGIOPLASTY WITH STENT PLACEMENT  03-15-1996   pci to rci and ramus branch using j&j [id number] stent,post dialated  at  20atm (rca)  and 14 atm (ramus branch. ef 55%            . HERNIA REPAIR  08/01/11   RIH  . INGUINAL HERNIA REPAIR Left 03/29/2013   Procedure: HERNIA REPAIR INGUINAL ADULT;  Surgeon: Edward Jolly, MD;  Location: [facility];  Service: General;  Laterality: Left;  . INSERTION OF MESH Left 03/29/2013   Procedure: INSERTION OF MESH;  Surgeon: Edward Jolly, MD;  Location: [facility];  Service: General;  Laterality: Left;  . JOINT REPLACEMENT  2006 and 2010   left 2006, right 2010  . KIDNEY SURGERY  2002 and 2005   2002 - partial removal of left. 2005 complete removal  . PROSTATE BIOPSY    . prostate seed implant    . radiation treatment  2002   prostate  . REVERSE SHOULDER ARTHROPLASTY Left 05/26/2017   Procedure: REVERSE LEFT TOTAL SHOULDER ARTHROPLASTY;  Surgeon: Netta Cedars, MD;  Location: [facility];  Service: Orthopedics;  Laterality: Left;  . stent implants  1997        Home Medications    Prior to Admission medications   Medication Sig Start Date End  Date Taking? Authorizing Provider  amLODipine (NORVASC) 2.5 MG tablet Take 2 tablets (5 mg total) by mouth every morning. 04/03/17   Erlene Quan, PA-C  amoxicillin (AMOXIL) 500 MG capsule Take 2,000 mg by mouth as needed. One hour before dental procedure    [provider]  aspirin 81 MG tablet Take 81 mg by mouth daily.     [provider]  atorvastatin (LIPITOR) 40 MG tablet TAKE 1 TABLET AT BEDTIME 07/09/18   Lorretta Harp, MD  enalapril (VASOTEC) 10 MG tablet Take 1 tablet (10 mg total) by mouth daily. 07/05/18   Lorretta Harp, MD  hydrOXYzine (ATARAX/VISTARIL) 25 MG tablet Take 25 mg by mouth every evening.    [provider]  isosorbide mononitrate (IMDUR) 30 MG 24 hr tablet Take 2 tablets (60 mg total) by mouth every morning. 08/11/17   Lorretta Harp, MD  isosorbide mononitrate (IMDUR) 30 MG 24 hr tablet TAKE 2 TABLETS EVERY MORNING 08/07/18   Lorretta Harp, MD  loratadine (CLARITIN) 10 MG tablet Take 10 mg by mouth daily.    [provider]  metoprolol succinate (TOPROL-XL) 25 MG 24 hr tablet TAKE 1 TABLET TWICE A DAY 01/29/18   Lorretta Harp, MD  Multiple Vitamins-Minerals (OCUVITE PO) Take 1 capsule by mouth 2 (two) times daily.     [provider]  nitroGLYCERIN (NITROSTAT) 0.4 MG SL tablet Place 1 tablet (0.4 mg total) under the tongue as needed. 08/11/17   Lorretta Harp, MD  omeprazole (PRILOSEC) 20 MG capsule TAKE 1 CAPSULE DAILY 01/05/18   Lorretta Harp, MD  Probiotic Product (PROBIOTIC PO) Take 1 capsule by mouth daily.     [provider]    Family History Family History  Problem Relation Age of Onset  . Cancer Mother        stomach?  . Stroke Father   . Stroke Brother     Social History Social History   Tobacco Use  . Smoking status: Former Smoker    Types: Cigarettes, Cigars    Last attempt to quit: 12/14/1963    Years since quitting: 54.7  . Smokeless tobacco: Never Used  Substance Use Topics    . Alcohol use: No  . Drug use: No     Allergies   Tape; Hydrocodone; Iohexol; and Oxycodone   Review of Systems Review of Systems  All other systems reviewed and are negative.    Physical Exam Updated Vital Signs BP (!) 147/81 (BP Location: Left Arm)   Pulse 85   Temp 98.3 F (36.8 C) (Oral)   Resp (!) 21   Ht _0  (1.702 m)   Wt 72.6 kg   SpO2 97%   BMI  25.06 kg/m   Physical Exam  Constitutional: He is oriented to person, place, and time. He appears well-developed and well-nourished. No distress.  HENT:  Head: Normocephalic. Head is with contusion.    Mouth/Throat: Uvula is midline, oropharynx is clear and moist and mucous membranes are normal.  Eyes: Pupils are equal, round, and reactive to light. Conjunctivae and EOM are normal.  Neck: Normal range of motion and full passive range of motion without pain. Neck supple. No spinous process tenderness and no muscular tenderness present. Normal range of motion present.  Cardiovascular: Normal rate, regular rhythm and intact distal pulses.  No murmur heard. Pulmonary/Chest: Effort normal and breath sounds normal. No respiratory distress. He has no wheezes. He has no rales.  Abdominal: Soft. He exhibits no distension. There is no tenderness. There is no rebound and no guarding.  Musculoskeletal: Normal range of motion. He exhibits no edema or tenderness.       Right shoulder: Normal.       Left shoulder: Normal.       Right wrist: Normal.       Left wrist: Normal.       Right hip: Normal.       Left hip: Normal.       Legs: Neurological: He is alert and oriented to person, place, and time. He has normal strength. Gait normal.  Able to ambulate without difficulty  Skin: Skin is warm and dry. No rash noted. No erythema.  Psychiatric: He has a normal mood and affect. His behavior is normal.  Nursing note and vitals reviewed.    ED Treatments / Results  Labs (all labs ordered are listed, but only abnormal results  are displayed) Labs Reviewed - No data to display  EKG None  Radiology Ct Head Wo Contrast  Result Date: 09/08/2018 CLINICAL DATA:  Fall, hit head. EXAM: CT HEAD WITHOUT CONTRAST TECHNIQUE: Contiguous axial images were obtained from the base of the skull through the vertex without intravenous contrast. COMPARISON:  03/14/2017 FINDINGS: Brain: There is atrophy and chronic small vessel disease changes. No acute intracranial abnormality. Specifically, no hemorrhage, hydrocephalus, mass lesion, acute infarction, or significant intracranial injury. Vascular: No hyperdense vessel or unexpected calcification. Skull: No acute calvarial abnormality. Sinuses/Orbits: Visualized paranasal sinuses and mastoids clear. Orbital soft tissues unremarkable. Other: none IMPRESSION: No acute intracranial abnormality. Atrophy, chronic microvascular disease. Electronically Signed   By: Rolm Baptise M.D.   On: 09/08/2018 15:43    Procedures Procedures (including critical care time) LACERATION REPAIR Performed by: [employer] Authorized by: Blanchie Dessert Consent: Verbal consent obtained. Risks and benefits: risks, benefits and alternatives were discussed Consent given by: patient Patient identity confirmed: provided demographic data Prepped and Draped in normal sterile fashion Wound explored  Laceration Location: forehead  Laceration Length: 1cm  No Foreign Bodies seen or palpated  Anesthesia:none  Irrigation method: syringe Amount of cleaning: standard  Skin closure: dermabond   Patient tolerance: Patient tolerated the procedure well with no immediate complications.   Medications Ordered in ED Medications - No data to display   Initial Impression / Assessment and Plan / ED Course  I have reviewed the triage vital signs and the nursing notes.  Pertinent labs & imaging results that were available during my care of the patient were reviewed by me and considered in my medical decision  making (see chart for details).     Elderly gentleman presenting today after a mechanical fall where he hit his forehead on a wood floor.  3 to 4 seconds of dark vision and now he feels back to normal.  He does have a mild headache.  He takes aspirin but no other anticoagulation.  He has no neck pain or neuro deficits at this time.  Tetanus shot is up-to-date.  Wound repair with Dermabond as above.  CT of the head is neg.  Pt and wife given strict return precautions and symptoms to look for for delayed bleed  Final Clinical Impressions(s) / ED Diagnoses   Final diagnoses:  Fall, initial encounter  Laceration of forehead, initial encounter    ED Discharge Orders    None       Blanchie Dessert, MD 09/08/18 1615

## 2018-09-08 NOTE — ED Notes (Signed)
ED Provider at bedside. 

## 2018-09-08 NOTE — ED Triage Notes (Signed)
Pt reports he missed last step and fell on hardwood floor. Hit head on floor. Denies LOC

## 2018-09-08 NOTE — ED Notes (Signed)
Patient transported to CT 

## 2018-09-13 DIAGNOSIS — H60311 Diffuse otitis externa, right ear: Secondary | ICD-10-CM | POA: Diagnosis not present

## 2018-09-15 ENCOUNTER — Other Ambulatory Visit: Payer: Self-pay | Admitting: Cardiovascular Disease

## 2018-10-05 DIAGNOSIS — H3554 Dystrophies primarily involving the retinal pigment epithelium: Secondary | ICD-10-CM | POA: Diagnosis not present

## 2018-10-05 DIAGNOSIS — H353222 Exudative age-related macular degeneration, left eye, with inactive choroidal neovascularization: Secondary | ICD-10-CM | POA: Diagnosis not present

## 2018-10-05 DIAGNOSIS — H353211 Exudative age-related macular degeneration, right eye, with active choroidal neovascularization: Secondary | ICD-10-CM | POA: Diagnosis not present

## 2018-10-11 DIAGNOSIS — H3091 Unspecified chorioretinal inflammation, right eye: Secondary | ICD-10-CM | POA: Diagnosis not present

## 2018-10-11 DIAGNOSIS — H353211 Exudative age-related macular degeneration, right eye, with active choroidal neovascularization: Secondary | ICD-10-CM | POA: Diagnosis not present

## 2018-10-17 DIAGNOSIS — R296 Repeated falls: Secondary | ICD-10-CM | POA: Diagnosis not present

## 2018-10-17 DIAGNOSIS — R7301 Impaired fasting glucose: Secondary | ICD-10-CM | POA: Diagnosis not present

## 2018-10-17 DIAGNOSIS — L570 Actinic keratosis: Secondary | ICD-10-CM | POA: Diagnosis not present

## 2018-10-17 DIAGNOSIS — E7800 Pure hypercholesterolemia, unspecified: Secondary | ICD-10-CM | POA: Diagnosis not present

## 2018-10-17 DIAGNOSIS — I10 Essential (primary) hypertension: Secondary | ICD-10-CM | POA: Diagnosis not present

## 2018-10-17 DIAGNOSIS — N183 Chronic kidney disease, stage 3 (moderate): Secondary | ICD-10-CM | POA: Diagnosis not present

## 2018-10-17 DIAGNOSIS — Z0000 Encounter for general adult medical examination without abnormal findings: Secondary | ICD-10-CM | POA: Diagnosis not present

## 2018-10-22 DIAGNOSIS — Z8546 Personal history of malignant neoplasm of prostate: Secondary | ICD-10-CM | POA: Diagnosis not present

## 2018-10-22 DIAGNOSIS — N281 Cyst of kidney, acquired: Secondary | ICD-10-CM | POA: Diagnosis not present

## 2018-10-22 DIAGNOSIS — Z85528 Personal history of other malignant neoplasm of kidney: Secondary | ICD-10-CM | POA: Diagnosis not present

## 2018-12-06 DIAGNOSIS — Z961 Presence of intraocular lens: Secondary | ICD-10-CM | POA: Diagnosis not present

## 2018-12-06 DIAGNOSIS — H353222 Exudative age-related macular degeneration, left eye, with inactive choroidal neovascularization: Secondary | ICD-10-CM | POA: Diagnosis not present

## 2018-12-06 DIAGNOSIS — H353211 Exudative age-related macular degeneration, right eye, with active choroidal neovascularization: Secondary | ICD-10-CM | POA: Diagnosis not present

## 2018-12-17 ENCOUNTER — Telehealth: Payer: Self-pay | Admitting: Cardiovascular Disease

## 2018-12-17 MED ORDER — AMLODIPINE BESYLATE 5 MG PO TABS: 5.0000 mg | ORAL_TABLET | Freq: Every day | ORAL | 1 refills | Status: DC

## 2018-12-17 NOTE — Telephone Encounter (Signed)
New message    *STAT* If patient is at the pharmacy, call can be transferred to refill team.   1. Which medications need to be refilled? (please list name of each medication and dose if known) amLODipine (NORVASC) 2.5 MG tablet  2. Which pharmacy/location (including street and city if local pharmacy) is medication to be sent to?[facility], [place]  3. Do they need a 30 day or 90 day supply? [place]

## 2018-12-19 ENCOUNTER — Telehealth: Payer: Self-pay | Admitting: Cardiovascular Disease

## 2018-12-19 MED ORDER — AMLODIPINE BESYLATE 5 MG PO TABS: 5.0000 mg | ORAL_TABLET | Freq: Every day | ORAL | 1 refills | Status: DC

## 2018-12-19 NOTE — Telephone Encounter (Signed)
Rx for Amlodipine 5 mg sent to pharmacy per fax request. Fax stated clarification was needed due to conflicting instructions. Pt previously on Amlodipine 2.5 mg, take 2 tab in the mornings. Rx has been updated to 5 mg QD.

## 2018-12-20 ENCOUNTER — Other Ambulatory Visit: Payer: Self-pay

## 2018-12-31 DIAGNOSIS — M9901 Segmental and somatic dysfunction of cervical region: Secondary | ICD-10-CM | POA: Diagnosis not present

## 2018-12-31 DIAGNOSIS — M5411 Radiculopathy, occipito-atlanto-axial region: Secondary | ICD-10-CM | POA: Diagnosis not present

## 2018-12-31 DIAGNOSIS — M9902 Segmental and somatic dysfunction of thoracic region: Secondary | ICD-10-CM | POA: Diagnosis not present

## 2018-12-31 DIAGNOSIS — R51 Headache: Secondary | ICD-10-CM | POA: Diagnosis not present

## 2018-12-31 DIAGNOSIS — M62830 Muscle spasm of back: Secondary | ICD-10-CM | POA: Diagnosis not present

## 2019-01-03 DIAGNOSIS — H353211 Exudative age-related macular degeneration, right eye, with active choroidal neovascularization: Secondary | ICD-10-CM | POA: Diagnosis not present

## 2019-01-03 DIAGNOSIS — Z961 Presence of intraocular lens: Secondary | ICD-10-CM | POA: Diagnosis not present

## 2019-01-03 DIAGNOSIS — H353222 Exudative age-related macular degeneration, left eye, with inactive choroidal neovascularization: Secondary | ICD-10-CM | POA: Diagnosis not present

## 2019-01-08 DIAGNOSIS — H353211 Exudative age-related macular degeneration, right eye, with active choroidal neovascularization: Secondary | ICD-10-CM | POA: Diagnosis not present

## 2019-01-16 DIAGNOSIS — H353222 Exudative age-related macular degeneration, left eye, with inactive choroidal neovascularization: Secondary | ICD-10-CM | POA: Diagnosis not present

## 2019-01-16 DIAGNOSIS — H353211 Exudative age-related macular degeneration, right eye, with active choroidal neovascularization: Secondary | ICD-10-CM | POA: Diagnosis not present

## 2019-01-28 DIAGNOSIS — M9901 Segmental and somatic dysfunction of cervical region: Secondary | ICD-10-CM | POA: Diagnosis not present

## 2019-01-28 DIAGNOSIS — M9902 Segmental and somatic dysfunction of thoracic region: Secondary | ICD-10-CM | POA: Diagnosis not present

## 2019-01-28 DIAGNOSIS — R51 Headache: Secondary | ICD-10-CM | POA: Diagnosis not present

## 2019-01-28 DIAGNOSIS — M5411 Radiculopathy, occipito-atlanto-axial region: Secondary | ICD-10-CM | POA: Diagnosis not present

## 2019-01-28 DIAGNOSIS — M62830 Muscle spasm of back: Secondary | ICD-10-CM | POA: Diagnosis not present

## 2019-02-20 ENCOUNTER — Telehealth: Payer: Self-pay | Admitting: Cardiovascular Disease

## 2019-02-20 NOTE — Telephone Encounter (Signed)
New Message:   Pt wants to know if he can take Meclizine for Vertigo along with his other medicine?

## 2019-02-20 NOTE — Telephone Encounter (Signed)
Is okay to take meclizine as prescribed, but patient may notice increase sedation (sleepiness) if taken with hydroxyzine.

## 2019-02-20 NOTE — Telephone Encounter (Signed)
Spoke to patient's wife Donald Todd's advice given.

## 2019-02-20 NOTE — Telephone Encounter (Signed)
Message sent to pharmacy for advice. °

## 2019-02-21 DIAGNOSIS — N183 Chronic kidney disease, stage 3 (moderate): Secondary | ICD-10-CM | POA: Diagnosis not present

## 2019-02-21 DIAGNOSIS — I2510 Atherosclerotic heart disease of native coronary artery without angina pectoris: Secondary | ICD-10-CM | POA: Diagnosis not present

## 2019-02-21 DIAGNOSIS — M150 Primary generalized (osteo)arthritis: Secondary | ICD-10-CM | POA: Diagnosis not present

## 2019-02-21 DIAGNOSIS — I10 Essential (primary) hypertension: Secondary | ICD-10-CM | POA: Diagnosis not present

## 2019-03-20 DIAGNOSIS — H353211 Exudative age-related macular degeneration, right eye, with active choroidal neovascularization: Secondary | ICD-10-CM | POA: Diagnosis not present

## 2019-03-20 DIAGNOSIS — Z961 Presence of intraocular lens: Secondary | ICD-10-CM | POA: Diagnosis not present

## 2019-03-20 DIAGNOSIS — H353222 Exudative age-related macular degeneration, left eye, with inactive choroidal neovascularization: Secondary | ICD-10-CM | POA: Diagnosis not present

## 2019-03-25 DIAGNOSIS — M5411 Radiculopathy, occipito-atlanto-axial region: Secondary | ICD-10-CM | POA: Diagnosis not present

## 2019-03-25 DIAGNOSIS — R51 Headache: Secondary | ICD-10-CM | POA: Diagnosis not present

## 2019-03-25 DIAGNOSIS — M62830 Muscle spasm of back: Secondary | ICD-10-CM | POA: Diagnosis not present

## 2019-03-25 DIAGNOSIS — M9902 Segmental and somatic dysfunction of thoracic region: Secondary | ICD-10-CM | POA: Diagnosis not present

## 2019-03-25 DIAGNOSIS — M9901 Segmental and somatic dysfunction of cervical region: Secondary | ICD-10-CM | POA: Diagnosis not present

## 2019-04-08 DIAGNOSIS — M9902 Segmental and somatic dysfunction of thoracic region: Secondary | ICD-10-CM | POA: Diagnosis not present

## 2019-04-08 DIAGNOSIS — M5411 Radiculopathy, occipito-atlanto-axial region: Secondary | ICD-10-CM | POA: Diagnosis not present

## 2019-04-08 DIAGNOSIS — R51 Headache: Secondary | ICD-10-CM | POA: Diagnosis not present

## 2019-04-08 DIAGNOSIS — M62830 Muscle spasm of back: Secondary | ICD-10-CM | POA: Diagnosis not present

## 2019-04-08 DIAGNOSIS — M9901 Segmental and somatic dysfunction of cervical region: Secondary | ICD-10-CM | POA: Diagnosis not present

## 2019-04-25 ENCOUNTER — Telehealth: Payer: Self-pay | Admitting: Cardiovascular Disease

## 2019-04-26 NOTE — Telephone Encounter (Signed)
home phone/ my chart/consent/ pre reg completed

## 2019-04-26 NOTE — Telephone Encounter (Signed)
Patient wants OV/Patient has a medical question/ Nurse was informed

## 2019-04-30 ENCOUNTER — Other Ambulatory Visit: Payer: Self-pay

## 2019-04-30 ENCOUNTER — Telehealth: Payer: Self-pay

## 2019-04-30 ENCOUNTER — Telehealth (INDEPENDENT_AMBULATORY_CARE_PROVIDER_SITE_OTHER): Payer: Medicare Other | Admitting: Cardiovascular Disease

## 2019-04-30 ENCOUNTER — Telehealth: Payer: Self-pay | Admitting: *Deleted

## 2019-04-30 ENCOUNTER — Other Ambulatory Visit: Payer: Self-pay | Admitting: Cardiovascular Disease

## 2019-04-30 DIAGNOSIS — E785 Hyperlipidemia, unspecified: Secondary | ICD-10-CM | POA: Diagnosis not present

## 2019-04-30 DIAGNOSIS — I10 Essential (primary) hypertension: Secondary | ICD-10-CM

## 2019-04-30 DIAGNOSIS — I2510 Atherosclerotic heart disease of native coronary artery without angina pectoris: Secondary | ICD-10-CM | POA: Diagnosis not present

## 2019-04-30 DIAGNOSIS — Z9861 Coronary angioplasty status: Secondary | ICD-10-CM

## 2019-04-30 MED ORDER — NITROGLYCERIN 0.4 MG SL SUBL: 0.4000 mg | SUBLINGUAL_TABLET | SUBLINGUAL | 3 refills | Status: DC | PRN

## 2019-04-30 NOTE — Telephone Encounter (Signed)
Rx request sent to pharmacy.  

## 2019-04-30 NOTE — Progress Notes (Signed)
.    Virtual Visit via Telephone Note   This visit type was conducted due to national recommendations for restrictions regarding the COVID-19 Pandemic (e.g. social distancing) in an effort to limit this patient's exposure and mitigate transmission in our community.  Due to his co-morbid illnesses, this patient is at least at moderate risk for complications without adequate follow up.  This format is felt to be most appropriate for this patient at this time.  The patient did not have access to video technology/had technical difficulties with video requiring transitioning to audio format only (telephone).  All issues noted in this document were discussed and addressed.  No physical exam could be performed with this format.  Please refer to the patient's chart for his  consent to telehealth for [facility].   Date:  04/30/2019   ID:  Donald Todd, DOB 10-29-1929, MRN [id number]  Patient Location: Home Provider Location: Home  PCP:  [name], MD  Cardiologist: Dr. Quay Burow Electrophysiologist:  None   Evaluation Performed:  Follow-Up Visit  Chief Complaint: 1 year follow-up  History of Present Illness:    Donald Todd is a 85 y.o.  thin-appearing married Caucasian male, father of 60, grandfather to 7 grandchildren, who I last saw in the office 05/04/18 .He has a history of RCA and ramus branch stenting by myself back in 1997. I catheterized him, December 22, 2003, revealing patent stent with a 60% mid LAD lesion, normal LV function. He did have a 40% ostial right renal artery stenosis with a known left nephrectomy. His last Myoview, performed August 04, 2010, revealed lateral scar, and catheterization performed several weeks prior to that revealed patent stents with noncritical CAD. He has remained asymptomatic. His last .Myoview stress test performed 02/15/13 moderate sized lateral wall scar consistent with his anatomy.He saw [name] in the office 04/03/17 after a  syncopal episode that occurred while he was bending over and running up. He's had a workup including an MRI of his brain which is unremarkable. He did injure his left shoulder and had this surgically addressed.  Since I saw him a year ago he is remained stable.    He denies chest pain or shortness of breath.  He is sheltering in place and socially distancing.  The patient does not have symptoms concerning for COVID-19 infection (fever, chills, cough, or new shortness of breath).    Past Medical History:  Diagnosis Date  . Arthritis   . CAD (coronary artery disease)    last cath. 07-09-2010  . Cancer [facility])    kidney cancer , prostate cancer   . Chronic kidney disease    lt kidnet removed  . H/O cardiovascular stress test 08-04-2010   ef 55% NO ISCHEMIA  . H/O unilateral nephrectomy    lt. nephrectomy  . Heart attack ([place]) 1993  . History of kidney stones   . Hyperlipemia   . Hypertension    renal dopplers 218.13-normal patency  . Inguinal hernia 08/01/11   right  . Macular degeneration   . Prostate disease    Cancer S/P seed implant  . Vertigo    Past Surgical History:  Procedure Laterality Date  . ANGIOPLASTY  1993  . athroscopic knee surgery  2002   right  . bone scan  2005  . CARDIAC CATHETERIZATION  07-09-2010   normal left ventricular function, coronary obsstructive disease with 20% proximal an 30-40% mid lt. anterior descending narrowing ; widely patent stent in the proximal  ramus intermediate vessel; 50-70-% narrowing in the mid circumflex,and widely patent stent  in the rt. coronary   . CARDIAC CATHETERIZATION  12-22-2003   widely patent stents with noncritical coronary artery disease and normalLV function  . CHOLECYSTECTOMY  05/05/2009   Laparoscopic  . colonscopy  2004   with biopsy  . CORONARY ANGIOPLASTY WITH STENT PLACEMENT  03-15-1996   pci to rci and ramus branch using j&j [id number] stent,post dialated  at  20atm (rca)  and 14 atm (ramus branch. ef 55%            .  HERNIA REPAIR  08/01/11   RIH  . INGUINAL HERNIA REPAIR Left 03/29/2013   Procedure: HERNIA REPAIR INGUINAL ADULT;  Surgeon: Edward Jolly, MD;  Location: [facility];  Service: General;  Laterality: Left;  . INSERTION OF MESH Left 03/29/2013   Procedure: INSERTION OF MESH;  Surgeon: Edward Jolly, MD;  Location: [facility];  Service: General;  Laterality: Left;  . JOINT REPLACEMENT  2006 and 2010   left 2006, right 2010  . KIDNEY SURGERY  2002 and 2005   2002 - partial removal of left. 2005 complete removal  . PROSTATE BIOPSY    . prostate seed implant    . radiation treatment  2002   prostate  . REVERSE SHOULDER ARTHROPLASTY Left 05/26/2017   Procedure: REVERSE LEFT TOTAL SHOULDER ARTHROPLASTY;  Surgeon: Netta Cedars, MD;  Location: [place];  Service: Orthopedics;  Laterality: Left;  . stent implants  1997     No outpatient medications have been marked as taking for the 04/30/19 encounter (Appointment) with Lorretta Harp, MD.     Allergies:   Tape; Hydrocodone; Iohexol; and Oxycodone   Social History   Tobacco Use  . Smoking status: Former Smoker    Types: Cigarettes, Cigars    Last attempt to quit: 12/14/1963    Years since quitting: 55.4  . Smokeless tobacco: Never Used  Substance Use Topics  . Alcohol use: No  . Drug use: No     Family Hx: The patient's family history includes Cancer in his mother; Stroke in his brother and father.  ROS:   Please see the history of present illness.     All other systems reviewed and are negative.   Prior CV studies:   The following studies were reviewed today:  None  Labs/Other Tests and Data Reviewed:    EKG:  No ECG reviewed.  Recent Labs: No results found for requested labs within last 8760 hours.   Recent Lipid Panel Lab Results  Component Value Date/Time   CHOL  05/05/2009 03:13 AM    124        ATP III CLASSIFICATION:  <200     mg/dL   Desirable  200-239  mg/dL   Borderline High  >=240    mg/dL   High           TRIG 116 05/05/2009 03:13 AM   HDL 39 (L) 05/05/2009 03:13 AM   CHOLHDL 3.2 05/05/2009 03:13 AM   LDLCALC  05/05/2009 03:13 AM    62        Total Cholesterol/HDL:CHD Risk Coronary Heart Disease Risk Table                     Men   Women  1/2 Average Risk   3.4   3.3  Average Risk       5.0   4.4  2 X Average Risk  9.6   7.1  3 X Average Risk  23.4   11.0        Use the calculated Patient Ratio above and the CHD Risk Table to determine the patient's CHD Risk.        ATP III CLASSIFICATION (LDL):  <100     mg/dL   Optimal  100-129  mg/dL   Near or Above                    Optimal  130-159  mg/dL   Borderline  160-189  mg/dL   High  >190     mg/dL   Very High    Wt Readings from Last 3 Encounters:  09/08/18 160 lb (72.6 kg)  05/04/18 167 lb (75.8 kg)  05/18/17 168 lb 14.4 oz (76.6 kg)     Objective:    Vital Signs:  There were no vitals taken for this visit.   VITAL SIGNS:  reviewed a complete physical exam was not performed today since this was a virtual telemedicine phone visit  ASSESSMENT & PLAN:    1. Essential hypertension-history of essential hypertension with blood pressure measured by the patient at home today of 159/82 with a pulse of 68.  He is on metoprolol and amlodipine. 2. Hyperlipidemia- on atorvastatin with lipid profile performed 10/17/2018 revealing total cholesterol 159, LDL 66 and HDL 77 3. Coronary artery disease- history of CAD with stenting by myself of the RCA and ramus branch in 1997, cath 12/22/2003 revealing patent stents.  His last Myoview performed 02/15/2013 was nonischemic.  COVID-19 Education: The signs and symptoms of COVID-19 were discussed with the patient and how to seek care for testing (follow up with PCP or arrange E-visit).  The importance of social distancing was discussed today.  Time:   Today, I have spent 6 minutes with the patient with telehealth technology discussing the above problems.     Medication Adjustments/Labs  and Tests Ordered: Current medicines are reviewed at length with the patient today.  Concerns regarding medicines are outlined above.   Tests Ordered: No orders of the defined types were placed in this encounter.   Medication Changes: No orders of the defined types were placed in this encounter.   Disposition:  Follow up in 1 year(s)  Signed, Quay Burow, MD  04/30/2019 8:06 AM    [place]

## 2019-04-30 NOTE — Telephone Encounter (Signed)
Visit call

## 2019-04-30 NOTE — Patient Instructions (Signed)

## 2019-04-30 NOTE — Telephone Encounter (Signed)
Patient and/or DPR-approved person aware of AVS instructions and verbalized understanding. Letter including After Visit Summary and any other necessary documents to be mailed to the patient's address on file. Pt requested nitro refill to be sent [facility]

## 2019-04-30 NOTE — Telephone Encounter (Signed)
Spoke with pt regarding his 9:30 appt (5/26). He states he would like to keep appt as telemedicine. He provided nurse with updated vitals 141/77 HR 70 and stated that he wanted lab work from his PCP to be reviewed by Dr. Gwenlyn Found.

## 2019-05-06 DIAGNOSIS — R51 Headache: Secondary | ICD-10-CM | POA: Diagnosis not present

## 2019-05-06 DIAGNOSIS — M62830 Muscle spasm of back: Secondary | ICD-10-CM | POA: Diagnosis not present

## 2019-05-06 DIAGNOSIS — M9902 Segmental and somatic dysfunction of thoracic region: Secondary | ICD-10-CM | POA: Diagnosis not present

## 2019-05-06 DIAGNOSIS — M9901 Segmental and somatic dysfunction of cervical region: Secondary | ICD-10-CM | POA: Diagnosis not present

## 2019-05-06 DIAGNOSIS — M5411 Radiculopathy, occipito-atlanto-axial region: Secondary | ICD-10-CM | POA: Diagnosis not present

## 2019-05-22 DIAGNOSIS — H353222 Exudative age-related macular degeneration, left eye, with inactive choroidal neovascularization: Secondary | ICD-10-CM | POA: Diagnosis not present

## 2019-05-22 DIAGNOSIS — Z961 Presence of intraocular lens: Secondary | ICD-10-CM | POA: Diagnosis not present

## 2019-05-22 DIAGNOSIS — H353211 Exudative age-related macular degeneration, right eye, with active choroidal neovascularization: Secondary | ICD-10-CM | POA: Diagnosis not present

## 2019-05-31 ENCOUNTER — Other Ambulatory Visit: Payer: Self-pay | Admitting: Cardiovascular Disease

## 2019-06-17 DIAGNOSIS — L602 Onychogryphosis: Secondary | ICD-10-CM | POA: Diagnosis not present

## 2019-06-17 DIAGNOSIS — L600 Ingrowing nail: Secondary | ICD-10-CM | POA: Diagnosis not present

## 2019-06-17 DIAGNOSIS — L84 Corns and callosities: Secondary | ICD-10-CM | POA: Diagnosis not present

## 2019-06-20 DIAGNOSIS — H353230 Exudative age-related macular degeneration, bilateral, stage unspecified: Secondary | ICD-10-CM | POA: Diagnosis not present

## 2019-06-20 DIAGNOSIS — Z961 Presence of intraocular lens: Secondary | ICD-10-CM | POA: Diagnosis not present

## 2019-06-20 DIAGNOSIS — H1851 Endothelial corneal dystrophy: Secondary | ICD-10-CM | POA: Diagnosis not present

## 2019-07-03 DIAGNOSIS — H353211 Exudative age-related macular degeneration, right eye, with active choroidal neovascularization: Secondary | ICD-10-CM | POA: Diagnosis not present

## 2019-07-04 ENCOUNTER — Other Ambulatory Visit: Payer: Self-pay | Admitting: Cardiovascular Disease

## 2019-07-15 DIAGNOSIS — M9902 Segmental and somatic dysfunction of thoracic region: Secondary | ICD-10-CM | POA: Diagnosis not present

## 2019-07-15 DIAGNOSIS — M62830 Muscle spasm of back: Secondary | ICD-10-CM | POA: Diagnosis not present

## 2019-07-15 DIAGNOSIS — M5411 Radiculopathy, occipito-atlanto-axial region: Secondary | ICD-10-CM | POA: Diagnosis not present

## 2019-07-15 DIAGNOSIS — M9901 Segmental and somatic dysfunction of cervical region: Secondary | ICD-10-CM | POA: Diagnosis not present

## 2019-07-15 DIAGNOSIS — R51 Headache: Secondary | ICD-10-CM | POA: Diagnosis not present

## 2019-08-01 ENCOUNTER — Other Ambulatory Visit: Payer: Self-pay | Admitting: Cardiovascular Disease

## 2019-08-14 DIAGNOSIS — M62830 Muscle spasm of back: Secondary | ICD-10-CM | POA: Diagnosis not present

## 2019-08-14 DIAGNOSIS — M9901 Segmental and somatic dysfunction of cervical region: Secondary | ICD-10-CM | POA: Diagnosis not present

## 2019-08-14 DIAGNOSIS — R51 Headache: Secondary | ICD-10-CM | POA: Diagnosis not present

## 2019-08-14 DIAGNOSIS — M9902 Segmental and somatic dysfunction of thoracic region: Secondary | ICD-10-CM | POA: Diagnosis not present

## 2019-08-14 DIAGNOSIS — M5411 Radiculopathy, occipito-atlanto-axial region: Secondary | ICD-10-CM | POA: Diagnosis not present

## 2019-08-27 DIAGNOSIS — H353211 Exudative age-related macular degeneration, right eye, with active choroidal neovascularization: Secondary | ICD-10-CM | POA: Diagnosis not present

## 2019-09-04 DIAGNOSIS — R51 Headache: Secondary | ICD-10-CM | POA: Diagnosis not present

## 2019-09-04 DIAGNOSIS — M5411 Radiculopathy, occipito-atlanto-axial region: Secondary | ICD-10-CM | POA: Diagnosis not present

## 2019-09-04 DIAGNOSIS — M9902 Segmental and somatic dysfunction of thoracic region: Secondary | ICD-10-CM | POA: Diagnosis not present

## 2019-09-04 DIAGNOSIS — M9901 Segmental and somatic dysfunction of cervical region: Secondary | ICD-10-CM | POA: Diagnosis not present

## 2019-09-04 DIAGNOSIS — M62830 Muscle spasm of back: Secondary | ICD-10-CM | POA: Diagnosis not present

## 2019-10-06 ENCOUNTER — Other Ambulatory Visit: Payer: Self-pay | Admitting: Cardiovascular Disease

## 2019-10-08 NOTE — Telephone Encounter (Signed)
Rx request sent to pharmacy.  

## 2019-10-22 DIAGNOSIS — H353211 Exudative age-related macular degeneration, right eye, with active choroidal neovascularization: Secondary | ICD-10-CM | POA: Diagnosis not present

## 2019-10-23 DIAGNOSIS — R413 Other amnesia: Secondary | ICD-10-CM | POA: Diagnosis not present

## 2019-10-23 DIAGNOSIS — I10 Essential (primary) hypertension: Secondary | ICD-10-CM | POA: Diagnosis not present

## 2019-10-23 DIAGNOSIS — N183 Chronic kidney disease, stage 3 unspecified: Secondary | ICD-10-CM | POA: Diagnosis not present

## 2019-10-23 DIAGNOSIS — E7800 Pure hypercholesterolemia, unspecified: Secondary | ICD-10-CM | POA: Diagnosis not present

## 2019-10-23 DIAGNOSIS — Z0000 Encounter for general adult medical examination without abnormal findings: Secondary | ICD-10-CM | POA: Diagnosis not present

## 2019-10-24 DIAGNOSIS — Z85528 Personal history of other malignant neoplasm of kidney: Secondary | ICD-10-CM | POA: Diagnosis not present

## 2019-10-24 DIAGNOSIS — N281 Cyst of kidney, acquired: Secondary | ICD-10-CM | POA: Diagnosis not present

## 2019-10-24 DIAGNOSIS — Z8546 Personal history of malignant neoplasm of prostate: Secondary | ICD-10-CM | POA: Diagnosis not present

## 2019-11-07 DIAGNOSIS — I10 Essential (primary) hypertension: Secondary | ICD-10-CM | POA: Diagnosis not present

## 2019-11-07 DIAGNOSIS — R413 Other amnesia: Secondary | ICD-10-CM | POA: Diagnosis not present

## 2019-11-07 DIAGNOSIS — Z0000 Encounter for general adult medical examination without abnormal findings: Secondary | ICD-10-CM | POA: Diagnosis not present

## 2019-11-07 DIAGNOSIS — R7301 Impaired fasting glucose: Secondary | ICD-10-CM | POA: Diagnosis not present

## 2019-11-07 DIAGNOSIS — L570 Actinic keratosis: Secondary | ICD-10-CM | POA: Diagnosis not present

## 2019-11-07 DIAGNOSIS — R296 Repeated falls: Secondary | ICD-10-CM | POA: Diagnosis not present

## 2019-11-07 DIAGNOSIS — E7800 Pure hypercholesterolemia, unspecified: Secondary | ICD-10-CM | POA: Diagnosis not present

## 2020-01-24 DIAGNOSIS — Z961 Presence of intraocular lens: Secondary | ICD-10-CM | POA: Diagnosis not present

## 2020-01-24 DIAGNOSIS — H353211 Exudative age-related macular degeneration, right eye, with active choroidal neovascularization: Secondary | ICD-10-CM | POA: Diagnosis not present

## 2020-02-24 DIAGNOSIS — M9901 Segmental and somatic dysfunction of cervical region: Secondary | ICD-10-CM | POA: Diagnosis not present

## 2020-02-24 DIAGNOSIS — M9902 Segmental and somatic dysfunction of thoracic region: Secondary | ICD-10-CM | POA: Diagnosis not present

## 2020-02-24 DIAGNOSIS — M5411 Radiculopathy, occipito-atlanto-axial region: Secondary | ICD-10-CM | POA: Diagnosis not present

## 2020-02-24 DIAGNOSIS — M62830 Muscle spasm of back: Secondary | ICD-10-CM | POA: Diagnosis not present

## 2020-02-24 DIAGNOSIS — G44209 Tension-type headache, unspecified, not intractable: Secondary | ICD-10-CM | POA: Diagnosis not present

## 2020-03-24 DIAGNOSIS — H353211 Exudative age-related macular degeneration, right eye, with active choroidal neovascularization: Secondary | ICD-10-CM | POA: Diagnosis not present

## 2020-03-24 DIAGNOSIS — H353222 Exudative age-related macular degeneration, left eye, with inactive choroidal neovascularization: Secondary | ICD-10-CM | POA: Diagnosis not present

## 2020-04-01 ENCOUNTER — Other Ambulatory Visit: Payer: Self-pay | Admitting: Cardiovascular Disease

## 2020-04-05 ENCOUNTER — Other Ambulatory Visit: Payer: Self-pay | Admitting: Cardiovascular Disease

## 2020-04-06 DIAGNOSIS — M62830 Muscle spasm of back: Secondary | ICD-10-CM | POA: Diagnosis not present

## 2020-04-06 DIAGNOSIS — M9901 Segmental and somatic dysfunction of cervical region: Secondary | ICD-10-CM | POA: Diagnosis not present

## 2020-04-06 DIAGNOSIS — G44209 Tension-type headache, unspecified, not intractable: Secondary | ICD-10-CM | POA: Diagnosis not present

## 2020-04-06 DIAGNOSIS — M9902 Segmental and somatic dysfunction of thoracic region: Secondary | ICD-10-CM | POA: Diagnosis not present

## 2020-04-06 DIAGNOSIS — M5411 Radiculopathy, occipito-atlanto-axial region: Secondary | ICD-10-CM | POA: Diagnosis not present

## 2020-04-27 ENCOUNTER — Other Ambulatory Visit: Payer: Self-pay | Admitting: Cardiovascular Disease

## 2020-04-29 DIAGNOSIS — Z961 Presence of intraocular lens: Secondary | ICD-10-CM | POA: Diagnosis not present

## 2020-04-29 DIAGNOSIS — H353211 Exudative age-related macular degeneration, right eye, with active choroidal neovascularization: Secondary | ICD-10-CM | POA: Diagnosis not present

## 2020-04-29 DIAGNOSIS — H353222 Exudative age-related macular degeneration, left eye, with inactive choroidal neovascularization: Secondary | ICD-10-CM | POA: Diagnosis not present

## 2020-04-30 ENCOUNTER — Other Ambulatory Visit: Payer: Self-pay

## 2020-04-30 ENCOUNTER — Ambulatory Visit (INDEPENDENT_AMBULATORY_CARE_PROVIDER_SITE_OTHER): Payer: Medicare Other | Admitting: Cardiovascular Disease

## 2020-04-30 ENCOUNTER — Encounter: Payer: Self-pay | Admitting: Cardiovascular Disease

## 2020-04-30 VITALS — BP 160/90 | HR 71 | Ht 67.0 in | Wt 165.8 lb

## 2020-04-30 DIAGNOSIS — I10 Essential (primary) hypertension: Secondary | ICD-10-CM | POA: Diagnosis not present

## 2020-04-30 DIAGNOSIS — I2510 Atherosclerotic heart disease of native coronary artery without angina pectoris: Secondary | ICD-10-CM

## 2020-04-30 DIAGNOSIS — E785 Hyperlipidemia, unspecified: Secondary | ICD-10-CM | POA: Diagnosis not present

## 2020-04-30 DIAGNOSIS — Z9861 Coronary angioplasty status: Secondary | ICD-10-CM | POA: Diagnosis not present

## 2020-04-30 MED ORDER — AMLODIPINE BESYLATE 10 MG PO TABS: 10.0000 mg | ORAL_TABLET | Freq: Every day | ORAL | 3 refills | Status: DC

## 2020-04-30 NOTE — Patient Instructions (Addendum)
Medication Instructions:  Your physician has recommended you make the following change in your medication: INCREASE amlodipine to 10mg  daily  *If you need a refill on your cardiac medications before your next appointment, please call your pharmacy*   Lab Work: NONE If you have labs (blood work) drawn today and your tests are completely normal, you will receive your results only by: [name] MyChart Message (if you have MyChart) OR . A paper copy in the mail If you have any lab test that is abnormal or we need to change your treatment, we will call you to review the results.   Testing/Procedures: Lexiscan Myoview to be done at Dr. Kennon Holter office   Follow-Up: At [facility], you and your health needs are our priority.  As part of our continuing mission to provide you with exceptional heart care, we have created designated Provider Care Teams.  These Care Teams include your primary Cardiologist (physician) and Advanced Practice Providers (APPs -  Physician Assistants and Nurse Practitioners) who all work together to provide you with the care you need, when you need it.  We recommend signing up for the patient portal called "MyChart".  Sign up information is provided on this After Visit Summary.  MyChart is used to connect with patients for Virtual Visits (Telemedicine).  Patients are able to view lab/test results, encounter notes, upcoming appointments, etc.  Non-urgent messages can be sent to your provider as well.   To learn more about what you can do with MyChart, go to [email or web address].    Your next appointment:   6-8 week(s)  The format for your next appointment:   In Person  Provider:    Dr. Gwenlyn Found     Other Instructions  Dr. Gwenlyn Found has requested that you schedule an appointment with one of our clinical pharmacists for a blood pressure check appointment in 4 weeks with our clinical pharmacist.  Please monitor your blood pressure (BP) at home, please bring your BP cuff and  your BP readings with you to this appointment  [place]:  Rest 5 minutes before taking your blood pressure.  Don't smoke or drink caffeinated beverages for at least 30 minutes before.  Take your blood pressure before (not after) you eat.  Sit comfortably with your back supported and both feet on the floor (don't cross your legs).  Elevate your arm to heart level on a table or a desk.  Use the proper sized cuff. It should fit smoothly and snugly around your bare upper arm. There should be enough room to slip a fingertip under the cuff. The bottom edge of the cuff should be 1 inch above the crease of the elbow.  Ideally, take 3 measurements at one sitting and record the average.

## 2020-04-30 NOTE — Assessment & Plan Note (Signed)
History of dyslipidemia on statin therapy with lipid profile performed 11/07/2019 revealing total cholesterol 149, LDL of 70 and HDL of 65.

## 2020-04-30 NOTE — Assessment & Plan Note (Signed)
History of essential hypertension blood pressure measured today 160/90.  He says he is been having headaches and dizziness recently.  He is on amlodipine 5 mg, enalapril and Toprol.  He does have moderate renal insufficiency with serum creatinine in the mid 1 range 5 months ago.  I am going to increase his amlodipine from 5 to 10 mg a day, have him keep a blood pressure log daily for 30 days and follow-up with one of our Pharm.D.'s to review.

## 2020-04-30 NOTE — Progress Notes (Signed)
04/30/2020 Donald Todd   08-19-1929  [id number]  Primary Physician [name], MD Primary Cardiologist: Lorretta Harp MD Lupe Carney, [place]  HPI:  Donald Todd is a 86 y.o.  thin-appearing married Caucasian male, father of 53, grandfather to 7 grandchildren, who I last spoke to for a virtual telemedicine phone visit 04/30/2019.Marland KitchenHe has a history of RCA and ramus branch stenting by myself back in 1997. I catheterized him, December 22, 2003, revealing patent stent with a 60% mid LAD lesion, normal LV function. He did have a 40% ostial right renal artery stenosis with a known left nephrectomy. His last Myoview, performed August 04, 2010, revealed lateral scar, and catheterization performed several weeks prior to that revealed patent stents with noncritical CAD. He has remained asymptomatic. His last .Myoview stress test performed 02/15/13 moderate sized lateral wall scar consistent with his anatomy.He saw [name] in the office 04/03/17 after a syncopal episode that occurred while he was bending over and running up. He's had a workup including an MRI of his brain which is unremarkable. He did injure his left shoulderand had this surgically addressed.  Since I spoke to him a year ago he is done well until recently when he is started to complain of headaches and dizziness.  In addition, he does complain of some atypical left chest pain.    Current Meds  Medication Sig  . amLODipine (NORVASC) 10 MG tablet Take 1 tablet (10 mg total) by mouth daily.  [name] amoxicillin (AMOXIL) 500 MG capsule Take 2,000 mg by mouth as needed. One hour before dental procedure  . aspirin 81 MG tablet Take 81 mg by mouth daily.   . enalapril (VASOTEC) 10 MG tablet TAKE 1 TABLET DAILY  . Influenza Vac High-Dose Quad (FLUZONE HIGH-DOSE QUADRIVALENT) 0.7 ML SUSY   . isosorbide mononitrate (IMDUR) 30 MG 24 hr tablet Take 2 tablets (60 mg total) by mouth every morning.  . loratadine (CLARITIN) 10 MG  tablet Take 10 mg by mouth daily.  . metoprolol succinate (TOPROL-XL) 25 MG 24 hr tablet Take 1 tablet (25 mg total) by mouth daily.  . nitroGLYCERIN (NITROSTAT) 0.4 MG SL tablet Place 1 tablet (0.4 mg total) under the tongue as needed.  [name] omeprazole (PRILOSEC) 20 MG capsule TAKE 1 CAPSULE DAILY  . Probiotic Product (PROBIOTIC PO) Take 1 capsule by mouth daily.   . [DISCONTINUED] amLODipine (NORVASC) 5 MG tablet TAKE 1 TABLET DAILY     Allergies  Allergen Reactions  . Tape Hives    Surgical tape - causes blisters  . Hydrocodone   . Iohexol      Desc: PT DOES RECALL REACTION JUST SAYS IT WAS A LONG TIME AGO FOR KIDNEY X-RAYS AND HE DIDN'T TOLERATE IT WELL-ARS 05/02/09-NOTE E-CHART STATES A HOT FEELING IS HIS REACTION, BUT HE CAN'T CONFIRM THAT WAS ALL   . Oxycodone Other (See Comments)    Shaking uncontrollably     Social History   Socioeconomic History  . Marital status: Married    Spouse name: Not on file  . Number of children: 5  . Years of education: 56  . Highest education level: Not on file  Occupational History  . Occupation: Retired    Comment: Firefighter, Marine scientist  Tobacco Use  . Smoking status: Former Smoker    Types: Cigarettes, Cigars    Quit date: 12/14/1963    Years since quitting: 56.4  . Smokeless tobacco: Never Used  Substance and Sexual  Activity  . Alcohol use: No  . Drug use: No  . Sexual activity: Not on file  Other Topics Concern  . Not on file  Social History Narrative   Lives at home w/ his wife   Right-handed   Caffeine: 2 cups of coffee per day, rare soft drink   Social Determinants of Health   Financial Resource Strain:   . Difficulty of Paying Living Expenses:   Food Insecurity:   . Worried About Charity fundraiser in the Last Year:   . Arboriculturist in the Last Year:   Transportation Needs:   . Film/video editor (Medical):   [name] Lack of Transportation (Non-Medical):   Physical Activity:   . Days of Exercise per Week:   .  Minutes of Exercise per Session:   Stress:   . Feeling of Stress :   Social Connections:   . Frequency of Communication with Friends and Family:   . Frequency of Social Gatherings with Friends and Family:   . Attends Religious Services:   . Active Member of Clubs or Organizations:   . Attends Archivist Meetings:   [name] Marital Status:   Intimate Partner Violence:   . Fear of Current or Ex-Partner:   . Emotionally Abused:   [name] Physically Abused:   . Sexually Abused:      Review of Systems: General: negative for chills, fever, night sweats or weight changes.  Cardiovascular: negative for chest pain, dyspnea on exertion, edema, orthopnea, palpitations, paroxysmal nocturnal dyspnea or shortness of breath Dermatological: negative for rash Respiratory: negative for cough or wheezing Urologic: negative for hematuria Abdominal: negative for nausea, vomiting, diarrhea, bright red blood per rectum, melena, or hematemesis Neurologic: negative for visual changes, syncope, or dizziness All other systems reviewed and are otherwise negative except as noted above.    Blood pressure (!) 160/90, pulse 71, height 5\' 7"  (1.702 m), weight 165 lb 12.8 oz (75.2 kg), SpO2 96 %.  General appearance: alert and no distress Neck: no adenopathy, no carotid bruit, no JVD, supple, symmetrical, trachea midline and thyroid not enlarged, symmetric, no tenderness/mass/nodules Lungs: clear to auscultation bilaterally Heart: regular rate and rhythm, S1, S2 normal, no murmur, click, rub or gallop Extremities: extremities normal, atraumatic, no cyanosis or edema Pulses: 2+ and symmetric Skin: Skin color, texture, turgor normal. No rashes or lesions Neurologic: Alert and oriented X 3, normal strength and tone. Normal symmetric reflexes. Normal coordination and gait  EKG at 71 with a incomplete right bundle branch block, left anterior fascicular block and nonspecific ST and T wave changes. I  Personally  reviewed this EKG.  ASSESSMENT AND PLAN:   Essential hypertension History of essential hypertension blood pressure measured today 160/90.  He says he is been having headaches and dizziness recently.  He is on amlodipine 5 mg, enalapril and Toprol.  He does have moderate renal insufficiency with serum creatinine in the mid 1 range 5 months ago.  I am going to increase his amlodipine from 5 to 10 mg a day, have him keep a blood pressure log daily for 30 days and follow-up with one of our Pharm.D.'s to review.  CAD S/P percutaneous coronary angioplasty History of CAD status post RCA and ramus branch stenting by myself back in 1997.  I catheterized him December 22, 2003 revealing patent stents with a 60% mid LAD lesion and normal LV function.  His last Myoview performed 02/15/2013 showed a moderate sized lateral wall scar consistent with  his known anatomy.  He has been having some occasional left chest pain accompanied by his headaches.  I will get a Myoview stress test to further evaluate.  Dyslipidemia History of dyslipidemia on statin therapy with lipid profile performed 11/07/2019 revealing total cholesterol 149, LDL of 70 and HDL of 65.      Lorretta Harp MD FACP,FACC,FAHA, [facility] 04/30/2020 3:12 PM

## 2020-04-30 NOTE — Assessment & Plan Note (Signed)
History of CAD status post RCA and ramus branch stenting by myself back in 1997.  I catheterized him December 22, 2003 revealing patent stents with a 60% mid LAD lesion and normal LV function.  His last Myoview performed 02/15/2013 showed a moderate sized lateral wall scar consistent with his known anatomy.  He has been having some occasional left chest pain accompanied by his headaches.  I will get a Myoview stress test to further evaluate.

## 2020-05-01 ENCOUNTER — Other Ambulatory Visit: Payer: Self-pay | Admitting: Cardiovascular Disease

## 2020-05-05 ENCOUNTER — Telehealth (HOSPITAL_COMMUNITY): Payer: Self-pay

## 2020-05-05 NOTE — Telephone Encounter (Signed)
Encounter complete. 

## 2020-05-07 ENCOUNTER — Ambulatory Visit (HOSPITAL_COMMUNITY)
Admission: RE | Admit: 2020-05-07 | Discharge: 2020-05-07 | Disposition: A | Discharge status: Home / Self Care | Payer: Medicare Other | Source: Ambulatory Visit | Attending: Cardiology | Admitting: Cardiology

## 2020-05-07 ENCOUNTER — Other Ambulatory Visit: Payer: Self-pay

## 2020-05-07 DIAGNOSIS — Z9861 Coronary angioplasty status: Secondary | ICD-10-CM | POA: Insufficient documentation

## 2020-05-07 DIAGNOSIS — I10 Essential (primary) hypertension: Secondary | ICD-10-CM | POA: Diagnosis not present

## 2020-05-07 DIAGNOSIS — I2510 Atherosclerotic heart disease of native coronary artery without angina pectoris: Secondary | ICD-10-CM | POA: Insufficient documentation

## 2020-05-07 LAB — MYOCARDIAL PERFUSION IMAGING
LV dias vol: 110 mL (ref 62–150)
LV sys vol: 51 mL
Peak HR: 88 {beats}/min
Rest HR: 68 {beats}/min
SDS: 1
SRS: 10
SSS: 11
TID: 1.02

## 2020-05-07 MED ORDER — TECHNETIUM TC 99M TETROFOSMIN IV KIT
29.2000 | PACK | Freq: Once | INTRAVENOUS | Status: AC | PRN
  Administered 2020-05-07: 29.2 via INTRAVENOUS
  Filled 2020-05-07: qty 30

## 2020-05-07 MED ORDER — REGADENOSON 0.4 MG/5ML IV SOLN
0.4000 mg | Freq: Once | INTRAVENOUS | Status: AC
Start: 2020-05-07 — End: 2020-05-07
  Administered 2020-05-07: 0.4 mg via INTRAVENOUS

## 2020-05-07 MED ORDER — TECHNETIUM TC 99M TETROFOSMIN IV KIT
10.6000 | PACK | Freq: Once | INTRAVENOUS | Status: AC | PRN
  Administered 2020-05-07: 10.6 via INTRAVENOUS
  Filled 2020-05-07: qty 11

## 2020-06-02 ENCOUNTER — Ambulatory Visit: Payer: Medicare Other

## 2020-06-09 DIAGNOSIS — I2510 Atherosclerotic heart disease of native coronary artery without angina pectoris: Secondary | ICD-10-CM | POA: Diagnosis not present

## 2020-06-09 DIAGNOSIS — M150 Primary generalized (osteo)arthritis: Secondary | ICD-10-CM | POA: Diagnosis not present

## 2020-06-09 DIAGNOSIS — N183 Chronic kidney disease, stage 3 unspecified: Secondary | ICD-10-CM | POA: Diagnosis not present

## 2020-06-09 DIAGNOSIS — I10 Essential (primary) hypertension: Secondary | ICD-10-CM | POA: Diagnosis not present

## 2020-06-09 DIAGNOSIS — E7800 Pure hypercholesterolemia, unspecified: Secondary | ICD-10-CM | POA: Diagnosis not present

## 2020-06-15 ENCOUNTER — Ambulatory Visit: Payer: Medicare Other

## 2020-06-18 ENCOUNTER — Ambulatory Visit: Payer: Medicare Other

## 2020-06-18 ENCOUNTER — Encounter (HOSPITAL_BASED_OUTPATIENT_CLINIC_OR_DEPARTMENT_OTHER): Payer: Self-pay | Admitting: Emergency Medicine

## 2020-06-18 ENCOUNTER — Observation Stay (HOSPITAL_COMMUNITY): Payer: Medicare Other

## 2020-06-18 ENCOUNTER — Other Ambulatory Visit: Payer: Self-pay

## 2020-06-18 ENCOUNTER — Emergency Department (HOSPITAL_BASED_OUTPATIENT_CLINIC_OR_DEPARTMENT_OTHER): Payer: Medicare Other

## 2020-06-18 ENCOUNTER — Observation Stay (HOSPITAL_BASED_OUTPATIENT_CLINIC_OR_DEPARTMENT_OTHER)
Admission: EM | Admit: 2020-06-18 | Discharge: 2020-06-19 | Disposition: A | Discharge status: Home / Self Care | Payer: Medicare Other | Attending: Internal Medicine | Admitting: Internal Medicine

## 2020-06-18 DIAGNOSIS — G9389 Other specified disorders of brain: Secondary | ICD-10-CM | POA: Diagnosis not present

## 2020-06-18 DIAGNOSIS — Z96612 Presence of left artificial shoulder joint: Secondary | ICD-10-CM | POA: Diagnosis not present

## 2020-06-18 DIAGNOSIS — I2510 Atherosclerotic heart disease of native coronary artery without angina pectoris: Secondary | ICD-10-CM | POA: Diagnosis not present

## 2020-06-18 DIAGNOSIS — N183 Chronic kidney disease, stage 3 unspecified: Secondary | ICD-10-CM | POA: Diagnosis not present

## 2020-06-18 DIAGNOSIS — R609 Edema, unspecified: Secondary | ICD-10-CM | POA: Diagnosis not present

## 2020-06-18 DIAGNOSIS — Z7982 Long term (current) use of aspirin: Secondary | ICD-10-CM | POA: Insufficient documentation

## 2020-06-18 DIAGNOSIS — Z87891 Personal history of nicotine dependence: Secondary | ICD-10-CM | POA: Diagnosis not present

## 2020-06-18 DIAGNOSIS — I10 Essential (primary) hypertension: Secondary | ICD-10-CM

## 2020-06-18 DIAGNOSIS — Z85528 Personal history of other malignant neoplasm of kidney: Secondary | ICD-10-CM | POA: Insufficient documentation

## 2020-06-18 DIAGNOSIS — Z96653 Presence of artificial knee joint, bilateral: Secondary | ICD-10-CM | POA: Diagnosis not present

## 2020-06-18 DIAGNOSIS — I6782 Cerebral ischemia: Secondary | ICD-10-CM | POA: Diagnosis not present

## 2020-06-18 DIAGNOSIS — Z20822 Contact with and (suspected) exposure to covid-19: Secondary | ICD-10-CM | POA: Insufficient documentation

## 2020-06-18 DIAGNOSIS — E785 Hyperlipidemia, unspecified: Secondary | ICD-10-CM | POA: Diagnosis not present

## 2020-06-18 DIAGNOSIS — R29818 Other symptoms and signs involving the nervous system: Secondary | ICD-10-CM | POA: Diagnosis not present

## 2020-06-18 DIAGNOSIS — G4489 Other headache syndrome: Secondary | ICD-10-CM | POA: Diagnosis not present

## 2020-06-18 DIAGNOSIS — R42 Dizziness and giddiness: Secondary | ICD-10-CM | POA: Diagnosis not present

## 2020-06-18 DIAGNOSIS — I7090 Unspecified atherosclerosis: Secondary | ICD-10-CM | POA: Diagnosis not present

## 2020-06-18 DIAGNOSIS — G319 Degenerative disease of nervous system, unspecified: Secondary | ICD-10-CM | POA: Diagnosis not present

## 2020-06-18 DIAGNOSIS — I129 Hypertensive chronic kidney disease with stage 1 through stage 4 chronic kidney disease, or unspecified chronic kidney disease: Secondary | ICD-10-CM | POA: Insufficient documentation

## 2020-06-18 DIAGNOSIS — Z9861 Coronary angioplasty status: Secondary | ICD-10-CM

## 2020-06-18 DIAGNOSIS — J3489 Other specified disorders of nose and nasal sinuses: Secondary | ICD-10-CM | POA: Diagnosis not present

## 2020-06-18 DIAGNOSIS — R9082 White matter disease, unspecified: Secondary | ICD-10-CM | POA: Diagnosis not present

## 2020-06-18 DIAGNOSIS — Z79899 Other long term (current) drug therapy: Secondary | ICD-10-CM | POA: Insufficient documentation

## 2020-06-18 LAB — BASIC METABOLIC PANEL
Anion gap: 9 (ref 5–15)
BUN: 21 mg/dL (ref 8–23)
CO2: 27 mmol/L (ref 22–32)
Calcium: 9.1 mg/dL (ref 8.9–10.3)
Chloride: 105 mmol/L (ref 98–111)
Creatinine, Ser: 1.15 mg/dL (ref 0.61–1.24)
GFR calc Af Amer: >60 mL/min (ref >60)
GFR calc non Af Amer: 56 mL/min — ABNORMAL LOW (ref >60)
Glucose, Bld: 113 mg/dL — ABNORMAL HIGH (ref 70–99)
Potassium: 4.6 mmol/L (ref 3.5–5.1)
Sodium: 141 mmol/L (ref 135–145)

## 2020-06-18 LAB — CBC WITH DIFFERENTIAL/PLATELET
Abs Immature Granulocytes: 0.02 10*3/uL (ref 0.00–0.07)
Basophils Absolute: 0.1 10*3/uL (ref 0.0–0.1)
Basophils Relative: 1 %
Eosinophils Absolute: 0.6 10*3/uL — ABNORMAL HIGH (ref 0.0–0.5)
Eosinophils Relative: 8 %
HCT: 40.4 % (ref 39.0–52.0)
Hemoglobin: 13.2 g/dL (ref 13.0–17.0)
Immature Granulocytes: 0 %
Lymphocytes Relative: 20 %
Lymphs Abs: 1.3 10*3/uL (ref 0.7–4.0)
MCH: 29.9 pg (ref 26.0–34.0)
MCHC: 32.7 g/dL (ref 30.0–36.0)
MCV: 91.4 fL (ref 80.0–100.0)
Monocytes Absolute: 0.4 10*3/uL (ref 0.1–1.0)
Monocytes Relative: 6 %
Neutro Abs: 4.2 10*3/uL (ref 1.7–7.7)
Neutrophils Relative %: 65 %
Platelets: 151 10*3/uL (ref 150–400)
RBC: 4.42 MIL/uL (ref 4.22–5.81)
RDW: 13.2 % (ref 11.5–15.5)
WBC: 6.5 10*3/uL (ref 4.0–10.5)
nRBC: 0 % (ref 0.0–0.2)

## 2020-06-18 LAB — SARS CORONAVIRUS 2 BY RT PCR (HOSPITAL ORDER, PERFORMED IN ~~LOC~~ HOSPITAL LAB): SARS Coronavirus 2: NEGATIVE

## 2020-06-18 MED ORDER — ONDANSETRON HCL 4 MG/2ML IJ SOLN: 4.0000 mg | Freq: Four times a day (QID) | INTRAMUSCULAR | Status: DC | PRN

## 2020-06-18 MED ORDER — IPRATROPIUM BROMIDE 0.03 % NA SOLN: 1.0000 | Freq: Two times a day (BID) | NASAL | Status: DC | PRN

## 2020-06-18 MED ORDER — ENOXAPARIN SODIUM 40 MG/0.4ML ~~LOC~~ SOLN
40.0000 mg | SUBCUTANEOUS | Status: DC
  Administered 2020-06-18: 40 mg via SUBCUTANEOUS
  Filled 2020-06-18: qty 0.4

## 2020-06-18 MED ORDER — ACETAMINOPHEN 325 MG PO TABS: 650.0000 mg | ORAL_TABLET | Freq: Four times a day (QID) | ORAL | Status: DC | PRN

## 2020-06-18 MED ORDER — ATORVASTATIN CALCIUM 40 MG PO TABS
40.0000 mg | ORAL_TABLET | Freq: Every day | ORAL | Status: DC
  Administered 2020-06-18: 40 mg via ORAL
  Filled 2020-06-18: qty 1

## 2020-06-18 MED ORDER — BUTALBITAL-APAP-CAFFEINE 50-325-40 MG PO TABS: 1.0000 | ORAL_TABLET | ORAL | Status: DC | PRN

## 2020-06-18 MED ORDER — ACETAMINOPHEN 650 MG RE SUPP: 650.0000 mg | Freq: Four times a day (QID) | RECTAL | Status: DC | PRN

## 2020-06-18 MED ORDER — AMLODIPINE BESYLATE 10 MG PO TABS
10.0000 mg | ORAL_TABLET | Freq: Every day | ORAL | Status: DC
  Administered 2020-06-18 – 2020-06-19 (×2): 10 mg via ORAL
  Filled 2020-06-18 (×2): qty 1

## 2020-06-18 MED ORDER — METOPROLOL SUCCINATE ER 25 MG PO TB24
25.0000 mg | ORAL_TABLET | Freq: Every day | ORAL | Status: DC
  Administered 2020-06-19: 25 mg via ORAL
  Filled 2020-06-18 (×2): qty 1

## 2020-06-18 MED ORDER — MECLIZINE HCL 25 MG PO TABS
25.0000 mg | ORAL_TABLET | Freq: Once | ORAL | Status: AC
  Administered 2020-06-18: 25 mg via ORAL
  Filled 2020-06-18: qty 1

## 2020-06-18 MED ORDER — HYDROXYZINE HCL 25 MG PO TABS
25.0000 mg | ORAL_TABLET | Freq: Every evening | ORAL | Status: DC
  Administered 2020-06-18: 25 mg via ORAL
  Filled 2020-06-18: qty 1

## 2020-06-18 MED ORDER — DIAZEPAM 5 MG PO TABS
5.0000 mg | ORAL_TABLET | Freq: Once | ORAL | Status: AC
  Administered 2020-06-18: 5 mg via ORAL
  Filled 2020-06-18: qty 1

## 2020-06-18 MED ORDER — ASPIRIN 81 MG PO CHEW
81.0000 mg | CHEWABLE_TABLET | Freq: Every day | ORAL | Status: DC
  Administered 2020-06-18 – 2020-06-19 (×2): 81 mg via ORAL
  Filled 2020-06-18 (×2): qty 1

## 2020-06-18 MED ORDER — MECLIZINE HCL 25 MG PO TABS: 25.0000 mg | ORAL_TABLET | Freq: Three times a day (TID) | ORAL | Status: DC | PRN

## 2020-06-18 MED ORDER — ENALAPRIL MALEATE 10 MG PO TABS
10.0000 mg | ORAL_TABLET | Freq: Every day | ORAL | Status: DC
  Administered 2020-06-18 – 2020-06-19 (×2): 10 mg via ORAL
  Filled 2020-06-18 (×2): qty 1

## 2020-06-18 MED ORDER — ONDANSETRON HCL 4 MG PO TABS: 4.0000 mg | ORAL_TABLET | Freq: Four times a day (QID) | ORAL | Status: DC | PRN

## 2020-06-18 MED ORDER — ACETAMINOPHEN 325 MG PO TABS
650.0000 mg | ORAL_TABLET | Freq: Once | ORAL | Status: AC
  Administered 2020-06-18: 650 mg via ORAL
  Filled 2020-06-18: qty 2

## 2020-06-18 NOTE — H&P (Signed)
History and Physical    Donald Todd:[id number] DOB: [birth year] DOA: 06/18/2020  PCP: [name], MD  Patient coming from: Home  Chief Complaint: Dizziness  HPI: Donald Todd is a 86 y.o. male with medical history significant of CAD, CKD, HTN, HLD who presented to  [facility] ED with complaints of marked "dizziness" that started on the morning of presentation. Pt reports hx of headaches improved with tylenol with bouts of "dizziness" prior to admit over the past several weeks. Symptoms typically are self-limiting. On the morning of presentation, pt ambulated to bathroom and experienced a frontal headache with radiation to back of neck, with marked "dizziness" described as room spinning around. Pt took a tylenol without improvement. Pt subsequently presented to ED for further work up.   ED Course: In the ED, CT head was noted to be unremarkable. Pt was given a trial of meclizine with some improvement in symptoms. Patient remained somewhat symptomatic, thus EDP discussed with Neurology on call who recommended observation and MRI. Hospitalist consulted and pt was accepted in transfer to [facility] for further work up.  Review of Systems:  Review of Systems  Constitutional: Negative for chills, fever and malaise/fatigue.  HENT: Negative for congestion, ear discharge and ear pain.   Eyes: Negative for double vision, photophobia and discharge.  Respiratory: Negative for cough and hemoptysis.   Cardiovascular: Negative for palpitations, orthopnea and claudication.  Gastrointestinal: Negative for abdominal pain, nausea and vomiting.    Past Medical History:  Diagnosis Date  . Arthritis   . CAD (coronary artery disease)    last cath. 07-09-2010  . Cancer [facility])    kidney cancer , prostate cancer   . Chronic kidney disease    lt kidnet removed  . H/O cardiovascular stress test 08-04-2010   ef 55% NO ISCHEMIA  . H/O unilateral nephrectomy    lt. nephrectomy  . Heart attack ([place]) 1993  .  History of kidney stones   . Hyperlipemia   . Hypertension    renal dopplers 218.13-normal patency  . Inguinal hernia 08/01/11   right  . Macular degeneration   . Prostate disease    Cancer S/P seed implant  . Vertigo     Past Surgical History:  Procedure Laterality Date  . ANGIOPLASTY  1993  . athroscopic knee surgery  2002   right  . bone scan  2005  . CARDIAC CATHETERIZATION  07-09-2010   normal left ventricular function, coronary obsstructive disease with 20% proximal an 30-40% mid lt. anterior descending narrowing ; widely patent stent in the proximal ramus intermediate vessel; 50-70-% narrowing in the mid circumflex,and widely patent stent  in the rt. coronary   . CARDIAC CATHETERIZATION  12-22-2003   widely patent stents with noncritical coronary artery disease and normalLV function  . CHOLECYSTECTOMY  05/05/2009   Laparoscopic  . colonscopy  2004   with biopsy  . CORONARY ANGIOPLASTY WITH STENT PLACEMENT  03-15-1996   pci to rci and ramus branch using j&j [id number] stent,post dialated  at  20atm (rca)  and 14 atm (ramus branch. ef 55%            . HERNIA REPAIR  08/01/11   RIH  . INGUINAL HERNIA REPAIR Left 03/29/2013   Procedure: HERNIA REPAIR INGUINAL ADULT;  Surgeon: Edward Jolly, MD;  Location: [facility];  Service: General;  Laterality: Left;  . INSERTION OF MESH Left 03/29/2013   Procedure: INSERTION OF MESH;  Surgeon: Edward Jolly, MD;  Location:  [facility];  Service: General;  Laterality: Left;  . JOINT REPLACEMENT  2006 and 2010   left 2006, right 2010  . KIDNEY SURGERY  2002 and 2005   2002 - partial removal of left. 2005 complete removal  . PROSTATE BIOPSY    . prostate seed implant    . radiation treatment  2002   prostate  . REVERSE SHOULDER ARTHROPLASTY Left 05/26/2017   Procedure: REVERSE LEFT TOTAL SHOULDER ARTHROPLASTY;  Surgeon: Netta Cedars, MD;  Location: [place];  Service: Orthopedics;  Laterality: Left;  . stent implants  1997     reports that he  quit smoking about 56 years ago. His smoking use included cigarettes and cigars. He has never used smokeless tobacco. He reports that he does not drink alcohol and does not use drugs.  Allergies  Allergen Reactions  . Tape Hives    Surgical tape - causes blisters  . Hydrocodone   . Iohexol      Desc: PT DOES RECALL REACTION JUST SAYS IT WAS A LONG TIME AGO FOR KIDNEY X-RAYS AND HE DIDN'T TOLERATE IT WELL-ARS 05/02/09-NOTE E-CHART STATES A HOT FEELING IS HIS REACTION, BUT HE CAN'T CONFIRM THAT WAS ALL   . Oxycodone Other (See Comments)    Shaking uncontrollably     Family History  Problem Relation Age of Onset  . Cancer Mother        stomach?  . Stroke Father   . Stroke Brother     Prior to Admission medications   Medication Sig Start Date End Date Taking? Authorizing Provider  amLODipine (NORVASC) 10 MG tablet Take 1 tablet (10 mg total) by mouth daily. 04/30/20   Lorretta Harp, MD  amoxicillin (AMOXIL) 500 MG capsule Take 2,000 mg by mouth as needed. One hour before dental procedure    [provider]  aspirin 81 MG tablet Take 81 mg by mouth daily.     [provider]  atorvastatin (LIPITOR) 40 MG tablet TAKE 1 TABLET AT BEDTIME Patient not taking: Reported on 04/30/2020 07/04/19   Lorretta Harp, MD  enalapril (VASOTEC) 10 MG tablet TAKE 1 TABLET DAILY 04/03/20   Lorretta Harp, MD  hydrOXYzine (ATARAX/VISTARIL) 25 MG tablet Take 25 mg by mouth every evening.    [provider]  Influenza Vac High-Dose Quad (FLUZONE HIGH-DOSE QUADRIVALENT) 0.7 ML SUSY  09/02/19   [provider]  isosorbide mononitrate (IMDUR) 30 MG 24 hr tablet Take 2 tablets (60 mg total) by mouth every morning. 08/11/17   Lorretta Harp, MD  loratadine (CLARITIN) 10 MG tablet Take 10 mg by mouth daily.    [provider]  metoprolol succinate (TOPROL-XL) 25 MG 24 hr tablet Take 1 tablet (25 mg total) by mouth daily. 04/07/20   Lorretta Harp, MD    nitroGLYCERIN (NITROSTAT) 0.4 MG SL tablet Place 1 tablet (0.4 mg total) under the tongue as needed. 04/30/19   Lorretta Harp, MD  omeprazole (PRILOSEC) 20 MG capsule TAKE 1 CAPSULE DAILY 04/30/19   Lorretta Harp, MD  Probiotic Product (PROBIOTIC PO) Take 1 capsule by mouth daily.     [provider]    Physical Exam: Vitals:   06/18/20 1031 06/18/20 1158 06/18/20 1324 06/18/20 1504  BP: 128/62 (!) 116/59 (!) 156/71 121/70  Pulse: 66 (!) 59 62 66  Resp: '17 17 18 17  ' Temp:    98.1 F (36.7 C)  TempSrc:    Oral  SpO2: 99% 99% 98%  100%    Constitutional: NAD, calm, comfortable Vitals:   06/18/20 1031 06/18/20 1158 06/18/20 1324 06/18/20 1504  BP: 128/62 (!) 116/59 (!) 156/71 121/70  Pulse: 66 (!) 59 62 66  Resp: '17 17 18 17  ' Temp:    98.1 F (36.7 C)  TempSrc:    Oral  SpO2: 99% 99% 98% 100%   Eyes: PERRL, lids and conjunctivae normal ENMT: Mucous membranes are moist. Dentition fair.  Neck: normal, supple, no masses Respiratory: clear to auscultation bilaterally, no wheezing, no crackles. Normal respiratory effort. No accessory muscle use.  Cardiovascular: Regular rate and rhythm, s1, s2 Abdomen: no tenderness, no masses palpated.. Bowel sounds positive.  Musculoskeletal: no clubbing / cyanosis. No joint deformity upper and lower extremities. Good ROM, no contractures. Normal muscle tone.  Skin: no rashes, lesions, No induration Neurologic: CN 2-12 grossly intact. Sensation intact, Strength 5/5 in all 4.  Psychiatric: Normal judgment and insight. Alert and oriented x 3. Normal mood.    Labs on Admission: I have personally reviewed following labs and imaging studies  CBC: Recent Labs  Lab 06/18/20 0755  WBC 6.5  NEUTROABS 4.2  HGB 13.2  HCT 40.4  MCV 91.4  PLT 168   Basic Metabolic Panel: Recent Labs  Lab 06/18/20 0755  NA 141  K 4.6  CL 105  CO2 27  GLUCOSE 113*  BUN 21  CREATININE 1.15  CALCIUM 9.1   GFR: CrCl cannot be calculated  (Unknown ideal weight.). Liver Function Tests: No results for input(s): AST, ALT, ALKPHOS, BILITOT, PROT, ALBUMIN in the last 168 hours. No results for input(s): LIPASE, AMYLASE in the last 168 hours. No results for input(s): AMMONIA in the last 168 hours. Coagulation Profile: No results for input(s): INR, PROTIME in the last 168 hours. Cardiac Enzymes: No results for input(s): CKTOTAL, CKMB, CKMBINDEX, TROPONINI in the last 168 hours. BNP (last 3 results) No results for input(s): PROBNP in the last 8760 hours. HbA1C: No results for input(s): HGBA1C in the last 72 hours. CBG: No results for input(s): GLUCAP in the last 168 hours. Lipid Profile: No results for input(s): CHOL, HDL, LDLCALC, TRIG, CHOLHDL, LDLDIRECT in the last 72 hours. Thyroid Function Tests: No results for input(s): TSH, T4TOTAL, FREET4, T3FREE, THYROIDAB in the last 72 hours. Anemia Panel: No results for input(s): VITAMINB12, FOLATE, FERRITIN, TIBC, IRON, RETICCTPCT in the last 72 hours. Urine analysis:    Component Value Date/Time   COLORURINE YELLOW 07/08/2010 1550   APPEARANCEUR CLEAR 07/08/2010 1550   LABSPEC 1.010 07/08/2010 1550   PHURINE 5.5 07/08/2010 1550   GLUCOSEU NEGATIVE 07/08/2010 1550   HGBUR NEGATIVE 07/08/2010 1550   BILIRUBINUR NEGATIVE 07/08/2010 1550   KETONESUR NEGATIVE 07/08/2010 1550   PROTEINUR NEGATIVE 07/08/2010 1550   UROBILINOGEN 0.2 07/08/2010 1550   NITRITE NEGATIVE 07/08/2010 1550   LEUKOCYTESUR  07/08/2010 1550    NEGATIVE MICROSCOPIC NOT DONE ON URINES WITH NEGATIVE PROTEIN, BLOOD, LEUKOCYTES, NITRITE, OR GLUCOSE <1000 mg/dL.   Sepsis Labs: !!!!!!!!!!!!!!!!!!!!!!!!!!!!!!!!!!!!!!!!!!!! '@LABRCNTIP' (procalcitonin:4,lacticidven:4) ) Recent Results (from the past 240 hour(s))  SARS Coronavirus 2 by RT PCR (hospital order, performed in [facility] hospital lab) Nasopharyngeal Nasopharyngeal Swab     Status: None   Collection Time: 06/18/20 11:45 AM   Specimen: Nasopharyngeal Swab   Result Value Ref Range Status   SARS Coronavirus 2 NEGATIVE NEGATIVE Final    Comment: (NOTE) SARS-CoV-2 target nucleic acids are NOT DETECTED.  The SARS-CoV-2 RNA is generally detectable in upper and lower respiratory specimens during the acute  phase of infection. The lowest concentration of SARS-CoV-2 viral copies this assay can detect is 250 copies / mL. A negative result does not preclude SARS-CoV-2 infection and should not be used as the sole basis for treatment or other patient management decisions.  A negative result may occur with improper specimen collection / handling, submission of specimen other than nasopharyngeal swab, presence of viral mutation(s) within the areas targeted by this assay, and inadequate number of viral copies (<250 copies / mL). A negative result must be combined with clinical observations, patient history, and epidemiological information.  Fact Sheet for Patients:   StrictlyIdeas.no  Fact Sheet for Healthcare Providers: BankingDealers.co.za  This test is not yet approved or  cleared by the [place] FDA and has been authorized for detection and/or diagnosis of SARS-CoV-2 by FDA under an Emergency Use Authorization (EUA).  This EUA will remain in effect (meaning this test can be used) for the duration of the COVID-19 declaration under Section 564(b)(1) of the Act, 21 U.S.C. section 360bbb-3(b)(1), unless the authorization is terminated or revoked sooner.  Performed at [facility], [address] [place], [place] [address]      Radiological Exams on Admission: CT Head Wo Contrast  Result Date: 06/18/2020 CLINICAL DATA:  Dizziness EXAM: CT HEAD WITHOUT CONTRAST TECHNIQUE: Contiguous axial images were obtained from the base of the skull through the vertex without intravenous contrast. COMPARISON:  2019 FINDINGS: Brain: There is no acute intracranial hemorrhage, mass effect, or edema.  Gray-white differentiation is preserved. There is no extra-axial fluid collection. Prominence of the ventricles and sulci reflects similar mild generalized parenchymal volume loss. Patchy and confluent areas of hypoattenuation in the supratentorial white matter are nonspecific but probably reflect stable moderate chronic microvascular ischemic changes. Vascular: There is mild atherosclerotic calcification at the skull base. Skull: Calvarium is unremarkable. Sinuses/Orbits: Minor mucosal thickening. Visualized orbits are unremarkable. Other: None. IMPRESSION: No acute intracranial hemorrhage, mass effect, or evidence of acute infarction. Chronic findings detailed above. Electronically Signed   By: Macy Mis M.D.   On: 06/18/2020 08:06    EKG: Independently reviewed. Sinus  Assessment/Plan Principal Problem:   Vertigo Active Problems:   CAD S/P percutaneous coronary angioplasty   Essential hypertension   Dyslipidemia   Chronic renal impairment, stage 3 (moderate)   S/P shoulder replacement, left   1. Vertigo 1. Initial head CT neg, reviewed 2. EDP discussed case with Neurology on call who had recommended f/u MRI brain, if pos for acute process, would then formally consult Neurology at that time 3. Continue on meclizine as tolerated 4. Consult PT/OT 2. CAD s/p PCA 1. Stable at this time. Denies chest pains 3. HTN 1. BP stable currently  2. Cont home meds as tolerated 4. CKD3 1. Renal function appears stable, improved from last documented in 2015 2. Repeat bmet in AM  DVT prophylaxis: Lovenox subq  Code Status: Full Family Communication: Pt in room  Disposition Plan: Uncertain at this time  Consults called:  Admission status: Observation as would likley require less than 2 midnight stay to work up persistent vertiginous symptoms   Marylu Lund MD [facility] Pager On Amion  If 7PM-7AM, please contact night-coverage  06/18/2020, 3:50 PM

## 2020-06-18 NOTE — ED Triage Notes (Addendum)
Pt c/o dizziness x 4 weeks. Pt states he has been evaluated by his cardiologist and ENT for same. Pt states he had episode of worsening dizziness this morning when going to bathroom. Pt has been using a cane due to feeling unbalanced.

## 2020-06-18 NOTE — ED Notes (Signed)
ED Provider at bedside. 

## 2020-06-18 NOTE — ED Notes (Signed)
Assisted to stand bedside stretcher, c/o of dizziness

## 2020-06-18 NOTE — ED Notes (Signed)
Pt. Still has a little dizziness upon standing after medication, noted some swaying while standing still, but pt. States "the dizziness has improved some".

## 2020-06-18 NOTE — ED Notes (Signed)
Report called to [facility] RN receiving nurse at [facility]

## 2020-06-18 NOTE — ED Notes (Signed)
Pt. Stated that he gets numbness and cold sensations in hands from time to time.  From laying to sitting on ortho's, HR dropped but BP remained consistent.

## 2020-06-18 NOTE — ED Notes (Signed)
Report given to Rich RN with [facility] ?

## 2020-06-18 NOTE — Progress Notes (Addendum)
Patient arrived via ambulance stretcher, A&O X4, able to slide over to bed with no issues. Low floor bed provided. Call bell in reach and shown how to use. No signs of distress or pain noted.

## 2020-06-18 NOTE — ED Provider Notes (Signed)
[place] EMERGENCY DEPARTMENT Provider Note  CSN: [id number] Arrival date & time: 06/18/20 3532    History Chief Complaint  Patient presents with   Dizziness    HPI  Donald Todd is a 86 y.o. male presents to the ED for evaluation of dizziness. Patient reports onset of room spinning dizziness when he got up to urinate around 0525hrs this morning. Symptoms worse with sitting and standing and improved with lying down, not associated with nausea or vomiting. He reports he has had episodes of headache and dizziness for the last several weeks but those episodes were different from today. He has seen Cardiology and had an unchanged stress test. Saw ENT to have his ears cleaned out as well. He describes previous dizzy spells as 'feeling unsteady' but not room spinning. Headache seems to improve with eating at times.    Past Medical History:  Diagnosis Date   Arthritis    CAD (coronary artery disease)    last cath. 07-09-2010   Cancer [facility])    kidney cancer , prostate cancer    Chronic kidney disease    lt kidnet removed   H/O cardiovascular stress test 08-04-2010   ef 55% NO ISCHEMIA   H/O unilateral nephrectomy    lt. nephrectomy   Heart attack ([place]) 1993   History of kidney stones    Hyperlipemia    Hypertension    renal dopplers 218.13-normal patency   Inguinal hernia 08/01/11   right   Macular degeneration    Prostate disease    Cancer S/P seed implant   Vertigo     Past Surgical History:  Procedure Laterality Date   ANGIOPLASTY  1993   athroscopic knee surgery  2002   right   bone scan  2005   CARDIAC CATHETERIZATION  07-09-2010   normal left ventricular function, coronary obsstructive disease with 20% proximal an 30-40% mid lt. anterior descending narrowing ; widely patent stent in the proximal ramus intermediate vessel; 50-70-% narrowing in the mid circumflex,and widely patent stent  in the rt. coronary    CARDIAC CATHETERIZATION   12-22-2003   widely patent stents with noncritical coronary artery disease and normalLV function   CHOLECYSTECTOMY  05/05/2009   Laparoscopic   colonscopy  2004   with biopsy   CORONARY ANGIOPLASTY WITH STENT PLACEMENT  03-15-1996   pci to rci and ramus branch using j&j [id number] stent,post dialated  at  20atm (rca)  and 14 atm (ramus branch. ef 55%             HERNIA REPAIR  08/01/11   RIH   INGUINAL HERNIA REPAIR Left 03/29/2013   Procedure: HERNIA REPAIR INGUINAL ADULT;  Surgeon: Edward Jolly, MD;  Location: [facility];  Service: General;  Laterality: Left;   INSERTION OF MESH Left 03/29/2013   Procedure: INSERTION OF MESH;  Surgeon: Edward Jolly, MD;  Location: [facility];  Service: General;  Laterality: Left;   JOINT REPLACEMENT  2006 and 2010   left 2006, right 2010   KIDNEY SURGERY  2002 and 2005   2002 - partial removal of left. 2005 complete removal   PROSTATE BIOPSY     prostate seed implant     radiation treatment  2002   prostate   REVERSE SHOULDER ARTHROPLASTY Left 05/26/2017   Procedure: REVERSE LEFT TOTAL SHOULDER ARTHROPLASTY;  Surgeon: Netta Cedars, MD;  Location: [place];  Service: Orthopedics;  Laterality: Left;   stent implants  1997    Family  History  Problem Relation Age of Onset   Cancer Mother        stomach?   Stroke Father    Stroke Brother     Social History   Tobacco Use   Smoking status: Former Smoker    Types: Cigarettes, Cigars    Quit date: 12/14/1963    Years since quitting: 56.5   Smokeless tobacco: Never Used  Vaping Use   Vaping Use: Never used  Substance Use Topics   Alcohol use: No   Drug use: No     Home Medications Prior to Admission medications   Medication Sig Start Date End Date Taking? Authorizing Provider  amLODipine (NORVASC) 10 MG tablet Take 1 tablet (10 mg total) by mouth daily. 04/30/20   Lorretta Harp, MD  amoxicillin (AMOXIL) 500 MG capsule Take 2,000 mg by mouth as needed. One hour before  dental procedure    [provider]  aspirin 81 MG tablet Take 81 mg by mouth daily.     [provider]  atorvastatin (LIPITOR) 40 MG tablet TAKE 1 TABLET AT BEDTIME Patient not taking: Reported on 04/30/2020 07/04/19   Lorretta Harp, MD  enalapril (VASOTEC) 10 MG tablet TAKE 1 TABLET DAILY 04/03/20   Lorretta Harp, MD  hydrOXYzine (ATARAX/VISTARIL) 25 MG tablet Take 25 mg by mouth every evening.    [provider]  Influenza Vac High-Dose Quad (FLUZONE HIGH-DOSE QUADRIVALENT) 0.7 ML SUSY  09/02/19   [provider]  isosorbide mononitrate (IMDUR) 30 MG 24 hr tablet Take 2 tablets (60 mg total) by mouth every morning. 08/11/17   Lorretta Harp, MD  loratadine (CLARITIN) 10 MG tablet Take 10 mg by mouth daily.    [provider]  metoprolol succinate (TOPROL-XL) 25 MG 24 hr tablet Take 1 tablet (25 mg total) by mouth daily. 04/07/20   Lorretta Harp, MD  nitroGLYCERIN (NITROSTAT) 0.4 MG SL tablet Place 1 tablet (0.4 mg total) under the tongue as needed. 04/30/19   Lorretta Harp, MD  omeprazole (PRILOSEC) 20 MG capsule TAKE 1 CAPSULE DAILY 04/30/19   Lorretta Harp, MD  Probiotic Product (PROBIOTIC PO) Take 1 capsule by mouth daily.     [provider]     Allergies    Tape, Hydrocodone, Iohexol, and Oxycodone   Review of Systems   Review of Systems A comprehensive review of systems was completed and negative except as noted in HPI.    Physical Exam BP (!) 156/71 (BP Location: Left Arm)    Pulse 62    Temp (!) 97.5 F (36.4 C) (Oral)    Resp 18    SpO2 98%   Physical Exam Vitals and nursing note reviewed.  Constitutional:      Appearance: Normal appearance.  HENT:     Head: Normocephalic and atraumatic.     Nose: Nose normal.     Mouth/Throat:     Mouth: Mucous membranes are moist.  Eyes:     Extraocular Movements: Extraocular movements intact.     Conjunctiva/sclera: Conjunctivae normal.  Cardiovascular:      Rate and Rhythm: Normal rate.  Pulmonary:     Effort: Pulmonary effort is normal.     Breath sounds: Normal breath sounds.  Abdominal:     General: Abdomen is flat.     Palpations: Abdomen is soft.     Tenderness: There is no abdominal tenderness.  Musculoskeletal:        General: No swelling. Normal  range of motion.     Cervical back: Neck supple.  Skin:    General: Skin is warm and dry.  Neurological:     General: No focal deficit present.     Mental Status: He is alert and oriented to person, place, and time.     Cranial Nerves: No cranial nerve deficit.     Sensory: No sensory deficit.     Motor: No weakness.     Coordination: Coordination normal.  Psychiatric:        Mood and Affect: Mood normal.      ED Results / Procedures / Treatments   Labs (all labs ordered are listed, but only abnormal results are displayed) Labs Reviewed  BASIC METABOLIC PANEL - Abnormal; Notable for the following components:      Result Value   Glucose, Bld 113 (*)    GFR calc non Af Amer 56 (*)    All other components within normal limits  CBC WITH DIFFERENTIAL/PLATELET - Abnormal; Notable for the following components:   Eosinophils Absolute 0.6 (*)    All other components within normal limits  SARS CORONAVIRUS 2 BY RT PCR [facility] ORDER, [place] [facility])    EKG EKG Interpretation  Date/Time:  Thursday June 18 2020 07:45:44 EDT Ventricular Rate:  63 PR Interval:    QRS Duration: 93 QT Interval:  412 QTC Calculation: 422 R Axis:   -69 Text Interpretation: Sinus rhythm Prolonged PR interval Inferior infarct, old Consider anterior infarct No significant change since last tracing Confirmed by Calvert Cantor [phone]) on 06/18/2020 7:54:34 AM    Radiology CT Head Wo Contrast  Result Date: 06/18/2020 CLINICAL DATA:  Dizziness EXAM: CT HEAD WITHOUT CONTRAST TECHNIQUE: Contiguous axial images were obtained from the base of the skull through the vertex without  intravenous contrast. COMPARISON:  2019 FINDINGS: Brain: There is no acute intracranial hemorrhage, mass effect, or edema. Gray-white differentiation is preserved. There is no extra-axial fluid collection. Prominence of the ventricles and sulci reflects similar mild generalized parenchymal volume loss. Patchy and confluent areas of hypoattenuation in the supratentorial white matter are nonspecific but probably reflect stable moderate chronic microvascular ischemic changes. Vascular: There is mild atherosclerotic calcification at the skull base. Skull: Calvarium is unremarkable. Sinuses/Orbits: Minor mucosal thickening. Visualized orbits are unremarkable. Other: None. IMPRESSION: No acute intracranial hemorrhage, mass effect, or evidence of acute infarction. Chronic findings detailed above. Electronically Signed   By: Macy Mis M.D.   On: 06/18/2020 08:06    Procedures Procedures  Medications Ordered in the ED Medications  meclizine (ANTIVERT) tablet 25 mg (25 mg Oral Given 06/18/20 0859)  diazepam (VALIUM) tablet 5 mg (5 mg Oral Given 06/18/20 1039)  acetaminophen (TYLENOL) tablet 650 mg (650 mg Oral Given 06/18/20 1356)     MDM Rules/Calculators/A&P MDM Patient with an episode of vertiginous dizziness today. Has been having dizzy spells and headache recently, different from today's presentation and negative Cards and ENT evaluations as an outpatient. Will check EKG, head CT and basic labs in the ED.  ED Course  I have reviewed the triage vital signs and the nursing notes.  Pertinent labs & imaging results that were available during my care of the patient were reviewed by me and considered in my medical decision making (see chart for details).  Clinical Course as of Jun 18 1454  Thu Jun 18, 2020  0807 CBC is normal.   [CS]  0820 CT images and results reviewed.    [CS]  0847 BMP unremarkable, patient still reports some dizziness with sitting/standing. Will give a dose of Meclizine and  reassess.    [CS]  4163 Patient reports continued dizziness with standing. Does not feel safe being at home. Will plan admission for further evaluation.    [CS]  1034 Spoke with Dr. Rory Percy who recommends trying diazepam, agrees with admission if not able to safely ambulate.    [CS]  1042 Hospitalist paged for admission.    [CS]  1109 Spoke with Dr. Wyline Copas who will accept for admission for further evaluation.    [CS]    Clinical Course User Index [CS] Truddie Hidden, MD    Final Clinical Impression(s) / ED Diagnoses Final diagnoses:  Vertigo    Rx / DC Orders ED Discharge Orders    None       Truddie Hidden, MD 06/18/20 1455

## 2020-06-19 DIAGNOSIS — I2510 Atherosclerotic heart disease of native coronary artery without angina pectoris: Secondary | ICD-10-CM

## 2020-06-19 DIAGNOSIS — Z9861 Coronary angioplasty status: Secondary | ICD-10-CM | POA: Diagnosis not present

## 2020-06-19 DIAGNOSIS — R42 Dizziness and giddiness: Secondary | ICD-10-CM | POA: Diagnosis not present

## 2020-06-19 DIAGNOSIS — I10 Essential (primary) hypertension: Secondary | ICD-10-CM | POA: Diagnosis not present

## 2020-06-19 DIAGNOSIS — E785 Hyperlipidemia, unspecified: Secondary | ICD-10-CM | POA: Diagnosis not present

## 2020-06-19 LAB — COMPREHENSIVE METABOLIC PANEL
ALT: 34 U/L (ref 0–44)
AST: 25 U/L (ref 15–41)
Albumin: 3.8 g/dL (ref 3.5–5.0)
Alkaline Phosphatase: 87 U/L (ref 38–126)
Anion gap: 9 (ref 5–15)
BUN: 22 mg/dL (ref 8–23)
CO2: 26 mmol/L (ref 22–32)
Calcium: 9.4 mg/dL (ref 8.9–10.3)
Chloride: 104 mmol/L (ref 98–111)
Creatinine, Ser: 1.13 mg/dL (ref 0.61–1.24)
GFR calc Af Amer: >60 mL/min (ref >60)
GFR calc non Af Amer: 57 mL/min — ABNORMAL LOW (ref >60)
Glucose, Bld: 111 mg/dL — ABNORMAL HIGH (ref 70–99)
Potassium: 4.3 mmol/L (ref 3.5–5.1)
Sodium: 139 mmol/L (ref 135–145)
Total Bilirubin: 0.8 mg/dL (ref 0.3–1.2)
Total Protein: 6.8 g/dL (ref 6.5–8.1)

## 2020-06-19 LAB — CBC
HCT: 42 % (ref 39.0–52.0)
Hemoglobin: 13.6 g/dL (ref 13.0–17.0)
MCH: 29.8 pg (ref 26.0–34.0)
MCHC: 32.4 g/dL (ref 30.0–36.0)
MCV: 91.9 fL (ref 80.0–100.0)
Platelets: 177 10*3/uL (ref 150–400)
RBC: 4.57 MIL/uL (ref 4.22–5.81)
RDW: 13.3 % (ref 11.5–15.5)
WBC: 8.2 10*3/uL (ref 4.0–10.5)
nRBC: 0 % (ref 0.0–0.2)

## 2020-06-19 MED ORDER — MECLIZINE HCL 25 MG PO TABS: 25.0000 mg | ORAL_TABLET | Freq: Three times a day (TID) | ORAL | 0 refills | Status: DC | PRN

## 2020-06-19 NOTE — Plan of Care (Signed)

## 2020-06-19 NOTE — Progress Notes (Signed)
Discharge instructions given with stated understanding.  Patient waiting for transportation home at this [facility]

## 2020-06-19 NOTE — Discharge Summary (Signed)
Physician Discharge Summary  Donald Todd HGD:[id number] DOB: 09-17-1929 DOA: 06/18/2020  PCP: [name], MD  Admit date: 06/18/2020 Discharge date: 06/19/2020 Consultations: None Admitted From: Home Disposition: home  Discharge Diagnoses:  Principal Problem:   Vertigo Active Problems:   CAD S/P percutaneous coronary angioplasty   Essential hypertension   Dyslipidemia   Chronic renal impairment, stage 3 (moderate)   S/P shoulder replacement, left   Hospital Course Summary: 86 y.o. male with medical history significant of CAD, CKD, HTN, HLD who presented to  [facility] ED with complaints of marked "dizziness" that started on the morning of presentation. Pt reports hx of headaches improved with tylenol with bouts of "dizziness" prior to admit over the past several weeks. Symptoms typically are self-limiting. On the morning of presentation, pt ambulated to bathroom and experienced a frontal headache with radiation to back of neck, with marked "dizziness" described as room spinning around. Pt took a tylenol without improvement. Pt subsequently presented to ED for further work up.   ED Course: In the ED, CT head was noted to be unremarkable. Pt was given a trial of meclizine with some improvement in symptoms. Patient remained somewhat symptomatic, thus EDP discussed with Neurology on call who recommended observation and MRI. Hospitalist consulted and pt was accepted in transfer to [facility] for further work up.  Hospital course: Patient underwent MRI which was negative for stroke.  Patient's condition improved with meclizine.  He has no complaints of dizziness or vertigo this morning.  Orthostatic blood pressures were okay.  Patient was seen by physical therapy-no nystagmus or vertigo on their evaluation.  Patient was discharged home with instructions to follow-up PCP and use meclizine as needed.  All other home medications were resumed at the time of discharge.  Discharge Exam:   Vitals:    06/18/20 1504 06/18/20 2117 06/19/20 0112 06/19/20 0510  BP: 121/70 122/69 130/72 129/67  Pulse: 66 66 65 65  Resp: 17 16 16 16   Temp: 98.1 F (36.7 C) 98.1 F (36.7 C) 97.8 F (36.6 C) 97.8 F (36.6 C)  TempSrc: Oral     SpO2: 100% 97% 96% 98%    General: Pt is alert, awake, not in acute distress Cardiovascular: RRR, S1/S2 +, no rubs, no gallops Respiratory: CTA bilaterally, no wheezing, no rhonchi Abdominal: Soft, NT, ND, bowel sounds + Extremities: no edema, no cyanosis  Discharge Condition:Stable CODE STATUS: Full code Diet recommendation: Heart healthy Recommendations for Outpatient Follow-up:  1. Follow up with PCP: 1 week 2. Follow up with consultants:  3. Please obtain follow up labs including:   Home Health services upon discharge: None Equipment/Devices upon discharge: None   Discharge Instructions:  Discharge Instructions    Call MD for:  difficulty breathing, headache or visual disturbances   Complete by: As directed    Call MD for:  extreme fatigue   Complete by: As directed    Call MD for:  persistant dizziness or light-headedness   Complete by: As directed    Call MD for:  persistant nausea and vomiting   Complete by: As directed    Call MD for:  severe uncontrolled pain   Complete by: As directed    Diet - low sodium heart healthy   Complete by: As directed    Increase activity slowly   Complete by: As directed      Allergies as of 06/19/2020      Reactions   Tape Hives   Surgical tape - causes blisters  Hydrocodone    Iohexol     Desc: PT DOES RECALL REACTION JUST SAYS IT WAS A LONG TIME AGO FOR KIDNEY X-RAYS AND HE DIDN'T TOLERATE IT WELL-ARS 05/02/09-NOTE E-CHART STATES A HOT FEELING IS HIS REACTION, BUT HE CAN'T CONFIRM THAT WAS ALL   Oxycodone Other (See Comments)   Shaking uncontrollably       Medication List    STOP taking these medications   amoxicillin 500 MG capsule Commonly known as: AMOXIL   Fluzone High-Dose Quadrivalent  0.7 ML Susy Generic drug: Influenza Vac High-Dose Quad   isosorbide mononitrate 30 MG 24 hr tablet Commonly known as: IMDUR   omeprazole 20 MG capsule Commonly known as: PRILOSEC   PROBIOTIC PO     TAKE these medications   amLODipine 10 MG tablet Commonly known as: NORVASC Take 1 tablet (10 mg total) by mouth daily.   aspirin 81 MG tablet Take 81 mg by mouth daily.   atorvastatin 40 MG tablet Commonly known as: LIPITOR TAKE 1 TABLET AT BEDTIME   enalapril 10 MG tablet Commonly known as: VASOTEC TAKE 1 TABLET DAILY   hydrOXYzine 25 MG tablet Commonly known as: ATARAX/VISTARIL Take 25 mg by mouth every evening.   ICAPS AREDS 2 PO Take 1 capsule by mouth daily.   ipratropium 0.03 % nasal spray Commonly known as: ATROVENT Place 1-2 sprays into both nostrils 2 (two) times daily as needed (allergies).   loratadine 10 MG tablet Commonly known as: CLARITIN Take 10 mg by mouth daily.   meclizine 25 MG tablet Commonly known as: ANTIVERT Take 1 tablet (25 mg total) by mouth 3 (three) times daily as needed for dizziness.   metoprolol succinate 25 MG 24 hr tablet Commonly known as: TOPROL-XL Take 1 tablet (25 mg total) by mouth daily.   nitroGLYCERIN 0.4 MG SL tablet Commonly known as: NITROSTAT Place 1 tablet (0.4 mg total) under the tongue as needed.       Allergies  Allergen Reactions  . Tape Hives    Surgical tape - causes blisters  . Hydrocodone   . Iohexol      Desc: PT DOES RECALL REACTION JUST SAYS IT WAS A LONG TIME AGO FOR KIDNEY X-RAYS AND HE DIDN'T TOLERATE IT WELL-ARS 05/02/09-NOTE E-CHART STATES A HOT FEELING IS HIS REACTION, BUT HE CAN'T CONFIRM THAT WAS ALL   . Oxycodone Other (See Comments)    Shaking uncontrollably       The results of significant diagnostics from this hospitalization (including imaging, microbiology, ancillary and laboratory) are listed below for reference.    Labs: BNP (last 3 results) No results for input(s): BNP in  the last 8760 hours. Basic Metabolic Panel: Recent Labs  Lab 06/18/20 0755 06/19/20 0341  NA 141 139  K 4.6 4.3  CL 105 104  CO2 27 26  GLUCOSE 113* 111*  BUN 21 22  CREATININE 1.15 1.13  CALCIUM 9.1 9.4   Liver Function Tests: Recent Labs  Lab 06/19/20 0341  AST 25  ALT 34  ALKPHOS 87  BILITOT 0.8  PROT 6.8  ALBUMIN 3.8   No results for input(s): LIPASE, AMYLASE in the last 168 hours. No results for input(s): AMMONIA in the last 168 hours. CBC: Recent Labs  Lab 06/18/20 0755 06/19/20 0341  WBC 6.5 8.2  NEUTROABS 4.2  --   HGB 13.2 13.6  HCT 40.4 42.0  MCV 91.4 91.9  PLT 151 177   Cardiac Enzymes: No results for input(s): CKTOTAL, CKMB, CKMBINDEX, TROPONINI  in the last 168 hours. BNP: Invalid input(s): POCBNP CBG: No results for input(s): GLUCAP in the last 168 hours. D-Dimer No results for input(s): DDIMER in the last 72 hours. Hgb A1c No results for input(s): HGBA1C in the last 72 hours. Lipid Profile No results for input(s): CHOL, HDL, LDLCALC, TRIG, CHOLHDL, LDLDIRECT in the last 72 hours. Thyroid function studies No results for input(s): TSH, T4TOTAL, T3FREE, THYROIDAB in the last 72 hours.  Invalid input(s): FREET3 Anemia work up No results for input(s): VITAMINB12, FOLATE, FERRITIN, TIBC, IRON, RETICCTPCT in the last 72 hours. Urinalysis    Component Value Date/Time   COLORURINE YELLOW 07/08/2010 1550   APPEARANCEUR CLEAR 07/08/2010 1550   LABSPEC 1.010 07/08/2010 1550   PHURINE 5.5 07/08/2010 1550   GLUCOSEU NEGATIVE 07/08/2010 1550   HGBUR NEGATIVE 07/08/2010 1550   BILIRUBINUR NEGATIVE 07/08/2010 1550   KETONESUR NEGATIVE 07/08/2010 1550   PROTEINUR NEGATIVE 07/08/2010 1550   UROBILINOGEN 0.2 07/08/2010 1550   NITRITE NEGATIVE 07/08/2010 1550   LEUKOCYTESUR  07/08/2010 1550    NEGATIVE MICROSCOPIC NOT DONE ON URINES WITH NEGATIVE PROTEIN, BLOOD, LEUKOCYTES, NITRITE, OR GLUCOSE <1000 mg/dL.   Sepsis Labs Invalid input(s):  PROCALCITONIN,  WBC,  LACTICIDVEN Microbiology Recent Results (from the past 240 hour(s))  SARS Coronavirus 2 by RT PCR (hospital order, performed in [facility] hospital lab) Nasopharyngeal Nasopharyngeal Swab     Status: None   Collection Time: 06/18/20 11:45 AM   Specimen: Nasopharyngeal Swab  Result Value Ref Range Status   SARS Coronavirus 2 NEGATIVE NEGATIVE Final    Comment: (NOTE) SARS-CoV-2 target nucleic acids are NOT DETECTED.  The SARS-CoV-2 RNA is generally detectable in upper and lower respiratory specimens during the acute phase of infection. The lowest concentration of SARS-CoV-2 viral copies this assay can detect is 250 copies / mL. A negative result does not preclude SARS-CoV-2 infection and should not be used as the sole basis for treatment or other patient management decisions.  A negative result may occur with improper specimen collection / handling, submission of specimen other than nasopharyngeal swab, presence of viral mutation(s) within the areas targeted by this assay, and inadequate number of viral copies (<250 copies / mL). A negative result must be combined with clinical observations, patient history, and epidemiological information.  Fact Sheet for Patients:   StrictlyIdeas.no  Fact Sheet for Healthcare Providers: BankingDealers.co.za  This test is not yet approved or  cleared by the [place] FDA and has been authorized for detection and/or diagnosis of SARS-CoV-2 by FDA under an Emergency Use Authorization (EUA).  This EUA will remain in effect (meaning this test can be used) for the duration of the COVID-19 declaration under Section 564(b)(1) of the Act, 21 U.S.C. section 360bbb-3(b)(1), unless the authorization is terminated or revoked sooner.  Performed at [facility], [address] [place], [place] [address]     Procedures/Studies: CT Head Wo Contrast  Result Date:  06/18/2020 CLINICAL DATA:  Dizziness EXAM: CT HEAD WITHOUT CONTRAST TECHNIQUE: Contiguous axial images were obtained from the base of the skull through the vertex without intravenous contrast. COMPARISON:  2019 FINDINGS: Brain: There is no acute intracranial hemorrhage, mass effect, or edema. Gray-white differentiation is preserved. There is no extra-axial fluid collection. Prominence of the ventricles and sulci reflects similar mild generalized parenchymal volume loss. Patchy and confluent areas of hypoattenuation in the supratentorial white matter are nonspecific but probably reflect stable moderate chronic microvascular ischemic changes. Vascular: There is mild atherosclerotic calcification at  the skull base. Skull: Calvarium is unremarkable. Sinuses/Orbits: Minor mucosal thickening. Visualized orbits are unremarkable. Other: None. IMPRESSION: No acute intracranial hemorrhage, mass effect, or evidence of acute infarction. Chronic findings detailed above. Electronically Signed   By: Macy Mis M.D.   On: 06/18/2020 08:06   MR BRAIN WO CONTRAST  Result Date: 06/18/2020 CLINICAL DATA:  Subacute neuro deficits. EXAM: MRI HEAD WITHOUT CONTRAST TECHNIQUE: Multiplanar, multiecho pulse sequences of the brain and surrounding structures were obtained without intravenous contrast. COMPARISON:  CT head 06/18/2020 FINDINGS: Brain: Mild to moderate atrophy. Moderate chronic microvascular ischemic change in the white matter and pons. Negative for acute infarct.  Negative for hemorrhage or mass. Vascular: Normal arterial flow voids. Skull and upper cervical spine: No focal skeletal lesion Sinuses/Orbits: Mild mucosal edema paranasal sinuses. Bilateral cataract extraction Other: None IMPRESSION: No acute abnormality Atrophy and chronic microvascular ischemic change in the white matter and pons. Electronically Signed   By: Franchot Gallo M.D.   On: 06/18/2020 20:28     Time coordinating discharge: Over 30  minutes  SIGNED:   Guilford Shi, MD  [facility] 06/19/2020, 6:34 PM

## 2020-06-19 NOTE — Evaluation (Signed)
Occupational Therapy Evaluation Patient Details Name: Donald Todd MRN: [id number] DOB: 11-May-1929 Today's Date: 06/19/2020    History of Present Illness Donald Todd is a 86 y.o. male with medical history significant of CAD, CKD, HTN, HLD who presented to  [facility] ED with complaints of marked "dizziness" that started on the morning of presentation. Pt reports hx of headaches improved with tylenol with bouts of "dizziness" prior to admit over the past several weeks. Symptoms typically are self-limiting. On the morning of presentation, pt ambulated to bathroom and experienced a frontal headache with radiation to back of neck, with marked "dizziness" described as room spinning around. Pt took a tylenol without improvement. Pt subsequently presented to ED for further work up.   Clinical Impression   Donald Todd is a pleasant 86 year old man who presented to hospital with complaints of dizziness. On evaluation patient demonstrates baseline ROM, strength and coordination of upper extremities. Reports dizziness symptoms have resolved. Patient demonstrated ability to perform transfers, ambulation in room and perform ADLs. No OT needs at this time.     Follow Up Recommendations  No OT follow up    Equipment Recommendations  None recommended by OT    Recommendations for Other Services       Precautions / Restrictions Precautions Precautions: Fall Restrictions Weight Bearing Restrictions: No      Mobility Bed Mobility               General bed mobility comments: Patient seated in recliner when therapist entered room.  Transfers                 General transfer comment: Demonstrates ability to amulate in room without device, stand at sink, perform toilet transfer without upper extremities to pull up. Patient wearing back brace for chronic back pain. Patient reports all symptoms resolved and ambulating normally.    Balance Overall balance assessment: No apparent  balance deficits (not formally assessed)                                         ADL either performed or assessed with clinical judgement   ADL Overall ADL's : At baseline                                       General ADL Comments: Demonstrates ability to perform feeding, perform lower body dressing, toileting and standing at sink for ADLs. reports no deficits and at baseline.     Vision Baseline Vision/History: Macular Degeneration (in the right eye, 20/50 vision)       Perception     Praxis      Pertinent Vitals/Pain Pain Assessment: 0-10 Pain Score: 3  Pain Descriptors / Indicators: Aching Pain Intervention(s): Monitored during session     Hand Dominance Right   Extremity/Trunk Assessment Upper Extremity Assessment Upper Extremity Assessment: RUE deficits/detail;LUE deficits/detail RUE Deficits / Details: 3-/5 shoulder, 4/5 elbow, wrist 4/5, grip 4/5 RUE Sensation: WNL RUE Coordination: WNL LUE Deficits / Details: 3-/5 shoulder, 4+/5 elbow, wrist 5/5, 4/5 grip LUE Sensation: WNL LUE Coordination: WNL   Lower Extremity Assessment Lower Extremity Assessment: Defer to PT evaluation   Cervical / Trunk Assessment Cervical / Trunk Assessment: Normal   Communication Communication Communication: No difficulties   Cognition Arousal/Alertness: Awake/alert Behavior During Therapy:  WFL for tasks assessed/performed Overall Cognitive Status: Within Functional Limits for tasks assessed                                     General Comments       Exercises     Shoulder Instructions      Home Living Family/patient expects to be discharged to:: Private residence Living Arrangements: Spouse/significant other   Type of Home: House Home Access: Stairs to enter [employer] of Steps: 1   Home Layout: Multi-level Alternate Level Stairs-Number of Steps: doesn't have to upstairs into "bonus room" Alternate Level  Stairs-Rails: Left Bathroom Shower/Tub: Occupational psychologist: Handicapped height Bathroom Accessibility: Yes How Accessible: Accessible via walker Home Equipment: [place] - single point;Walker - 2 wheels;Walker - 4 wheels;Shower seat - built in          Prior Functioning/Environment Level of Independence: Independent with assistive device(s)        Comments: sometimes uses a cane        OT Problem List:        OT Treatment/Interventions:      OT Goals(Current goals can be found in the care plan section) Acute Rehab OT Goals Patient Stated Goal: To get breakfast OT Goal Formulation: All assessment and education complete, DC therapy  OT Frequency:     Barriers to D/C:            Co-evaluation              AM-PAC OT "6 Clicks" Daily Activity     Outcome Measure Help from another person eating meals?: None Help from another person taking care of personal grooming?: None Help from another person toileting, which includes using toliet, bedpan, or urinal?: None Help from another person bathing (including washing, rinsing, drying)?: None Help from another person to put on and taking off regular upper body clothing?: None Help from another person to put on and taking off regular lower body clothing?: None 6 Click Score: 24   End of Session Equipment Utilized During Treatment: Gait belt Nurse Communication: Mobility status (removed fall pads from floor)  Activity Tolerance: Patient tolerated treatment well Patient left: in chair;with call bell/phone within reach  OT Visit Diagnosis: Unsteadiness on feet (R26.81);Dizziness and giddiness (R42)                Time: 2010-0712 OT Time Calculation (min): 14 min Charges:  OT General Charges $OT Visit: 1 Visit OT Evaluation $OT Eval Low Complexity: 1 Low  Nuha Degner, OTR/L [place]  Office [phone] Pager: [place] 06/19/2020, 10:22 AM

## 2020-06-19 NOTE — Discharge Instructions (Signed)

## 2020-06-19 NOTE — Evaluation (Signed)
Physical Therapy Evaluation Patient Details Name: HANAN MOEN MRN: [id number] DOB: 09/04/1929 Today's Date: 06/19/2020   History of Present Illness  DEMETRIUS MAHLER is a 86 y.o. male with medical history significant of CAD, CKD, HTN, HLD who presented to  [facility] ED with complaints of marked "dizziness" that started on the morning of presentation. Pt reports hx of headaches improved with tylenol with bouts of "dizziness" prior to admit over the past several weeks. Symptoms typically are self-limiting. On the morning of presentation, pt ambulated to bathroom and experienced a frontal headache with radiation to back of neck, with marked "dizziness" described as room spinning around. Pt took a tylenol without improvement. Pt subsequently presented to ED for further work up.  Clinical Impression  Patient evaluated by Physical Therapy with no further acute PT needs identified. All education has been completed and the patient has no further questions. See below for any follow-up Physical Therapy or equipment needs. PT is signing off. Thank you for this referral.   Pt had no dizziness or symptoms with vestibular testing only reporting mild brief dizziness once during ambulation.  Pt encouraged to journal next onset more specifically and provided referral for outpatient neurorehab if symptoms return.  Pt sometimes uses SPC for his knees so encouraged using for more stability upon d/c home for safety.  Pt had no further questions and feels ready for d/c.    06/19/20 0001  Vestibular Assessment  General Observation Pt with resting right lateral head tilt and right shoulder lower then left side.  Symptom Behavior  Subjective history of current problem Pt reports episodes of ringing in his ears however none at present.  Pt reports spinning sensations started when he woke up and went to use bathroom.  Pt able to return to bed and sleep again however spinning present upon awakening again and he was unable to  walk leading to this admission.  Pt reports headache and then onset of spinning with previous episodes.  Pt also has macular degeneration.  Pt reports he had seen the eye doctor and cardiologist for the spinning in the past, even a "specialty ear" doctor which cleaned wax from his ears.  Pt also reports he had increased sinus drainage recently which was so severe, he went to get check for covid (he was negative).  Type of Dizziness  Spinning  Frequency of Dizziness uncertain  Duration of Dizziness can last over an hour  Symptom Nature Motion provoked  Aggravating Factors Spontaneous onset;Forward bending  Relieving Factors Rest  Progression of Symptoms Better  Oculomotor Exam  Oculomotor Alignment Abnormal  Spontaneous Absent  Gaze-induced  Age appropriate nystagmus at end range  Head shaking Horizontal Absent  Head Shaking Vertical Absent  Smooth Pursuits Intact  Saccades Intact  Positional Testing  Sidelying Test Sidelying Right;Sidelying Left  Sidelying Right  Sidelying Right Symptoms No nystagmus  Sidelying Left  Sidelying Left Symptoms No nystagmus      Follow Up Recommendations No PT follow up (discussed OP PT neurorehab and provided referral handout if symptoms return)    Equipment Recommendations  None recommended by PT    Recommendations for Other Services       Precautions / Restrictions Precautions Precautions: Fall      Mobility  Bed Mobility Overal bed mobility: Modified Independent             General bed mobility comments: Patient seated in recliner when therapist entered room.  Transfers Overall transfer level: Modified independent  General transfer comment: Demonstrates ability to amulate in room without device, stand at sink, perform toilet transfer without upper extremities to pull up. Patient wearing back brace for chronic back pain. Patient reports all symptoms resolved and ambulating  normally.  Ambulation/Gait Ambulation/Gait assistance: Supervision;Modified independent (Device/Increase time) Gait Distance (Feet): 400 Feet Assistive device: None Gait Pattern/deviations: Step-through pattern;Decreased stride length     General Gait Details: pt reports mild dizziness lasting approx 10 seconds however resolved, pt encouraged to use SPC for safety and due to his reported "weak" knees  Stairs            Wheelchair Mobility    Modified Rankin (Stroke Patients Only)       Balance Overall balance assessment: No apparent balance deficits (not formally assessed)                                           Pertinent Vitals/Pain Pain Assessment: 0-10 Pain Score: 3  Pain Location: bil knees Pain Descriptors / Indicators: Aching Pain Intervention(s): Monitored during session    Home Living Family/patient expects to be discharged to:: Private residence Living Arrangements: Spouse/significant other Available Help at Discharge: Family;Available 24 hours/day Type of Home: House Home Access: Stairs to enter   [employer] of Steps: 1 Home Layout: Multi-level;Able to live on main level with bedroom/bathroom Home Equipment: Kasandra Knudsen - single point;Walker - 2 wheels;Walker - 4 wheels;Shower seat - built in      Prior Function Level of Independence: Independent with assistive device(s)         Comments: sometimes uses a cane     Hand Dominance   Dominant Hand: Right    Extremity/Trunk Assessment   Upper Extremity Assessment Upper Extremity Assessment: RUE deficits/detail;LUE deficits/detail RUE Deficits / Details: 3-/5 shoulder, 4/5 elbow, wrist 4/5, grip 4/5 RUE Sensation: WNL RUE Coordination: WNL LUE Deficits / Details: 3-/5 shoulder, 4+/5 elbow, wrist 5/5, 4/5 grip LUE Sensation: WNL LUE Coordination: WNL    Lower Extremity Assessment Lower Extremity Assessment: Defer to PT evaluation    Cervical / Trunk  Assessment Cervical / Trunk Assessment: Other exceptions Cervical / Trunk Exceptions: right shoulder resting inferior to left  Communication   Communication: No difficulties  Cognition Arousal/Alertness: Awake/alert Behavior During Therapy: WFL for tasks assessed/performed Overall Cognitive Status: Within Functional Limits for tasks assessed                                        General Comments      Exercises     Assessment/Plan    PT Assessment Patent does not need any further PT services  PT Problem List         PT Treatment Interventions      PT Goals (Current goals can be found in the Care Plan section)  Acute Rehab PT Goals Patient Stated Goal: To get breakfast PT Goal Formulation: All assessment and education complete, DC therapy    Frequency     Barriers to discharge        Co-evaluation               AM-PAC PT "6 Clicks" Mobility  Outcome Measure Help needed turning from your back to your side while in a flat bed without using bedrails?: None Help needed moving from lying  on your back to sitting on the side of a flat bed without using bedrails?: None Help needed moving to and from a bed to a chair (including a wheelchair)?: None Help needed standing up from a chair using your arms (e.g., wheelchair or bedside chair)?: A Little Help needed to walk in hospital room?: A Little Help needed climbing 3-5 steps with a railing? : A Little 6 Click Score: 21    End of Session   Activity Tolerance: Patient tolerated treatment well Patient left: in chair;with call bell/phone within reach;with chair alarm set Nurse Communication: Mobility status PT Visit Diagnosis: Difficulty in walking, not elsewhere classified (R26.2)    Time: 3005-1102 PT Time Calculation (min) (ACUTE ONLY): 40 min   Charges:   PT Evaluation $PT Eval Moderate Complexity: 1 Mod PT Treatments $Gait Training: 8-22 mins       Arlyce Dice, DPT Acute Rehabilitation  Services Pager: [phone] Office: [place] E 06/19/2020, 12:48 PM

## 2020-06-26 ENCOUNTER — Encounter: Payer: Self-pay | Admitting: Cardiovascular Disease

## 2020-06-26 ENCOUNTER — Ambulatory Visit (INDEPENDENT_AMBULATORY_CARE_PROVIDER_SITE_OTHER): Payer: Medicare Other | Admitting: Cardiovascular Disease

## 2020-06-26 ENCOUNTER — Other Ambulatory Visit: Payer: Self-pay

## 2020-06-26 DIAGNOSIS — I2510 Atherosclerotic heart disease of native coronary artery without angina pectoris: Secondary | ICD-10-CM | POA: Diagnosis not present

## 2020-06-26 DIAGNOSIS — Z9861 Coronary angioplasty status: Secondary | ICD-10-CM | POA: Diagnosis not present

## 2020-06-26 NOTE — Progress Notes (Signed)
06/26/2020 Donald Todd   1929/08/10  [id number]  Primary Physician [name], MD Primary Cardiologist: Lorretta Harp MD FACP, [place], [place], [place]  HPI:  Donald Todd is a 86 y.o.  thin-appearing married Caucasian male, father of 5, grandfather to 7 grandchildren, who I last  saw in the office 04/30/2020. He has a history of RCA and ramus branch stenting by myself back in 1997. I catheterized him, December 22, 2003, revealing patent stent with a 60% mid LAD lesion, normal LV function. He did have a 40% ostial right renal artery stenosis with a known left nephrectomy. His last Myoview, performed August 04, 2010, revealed lateral scar, and catheterization performed several weeks prior to that revealed patent stents with noncritical CAD. He has remained asymptomatic. His last .Myoview stress test performed 02/15/13 moderate sized lateral wall scar consistent with his anatomy.He saw [name] in the office 04/03/17 after a syncopal episode that occurred while he was bending over and running up. He's had a workup including an MRI of his brain which is unremarkable. He did injure his left shoulderand had this surgically addressed.  Since I saw him 2 months ago he was seen in the ER a week ago with vertigo.  A CT and MRI of his head were unremarkable.  Because of some atypical chest pain I perform Myoview stress testing on him 05/07/2020 that showed lateral scar without ischemia and change from prior Myoview stress test.  His pain is somewhat atypical.    Current Meds  Medication Sig  . amLODipine (NORVASC) 10 MG tablet Take 1 tablet (10 mg total) by mouth daily.  [name] amoxicillin (AMOXIL) 500 MG tablet Take 500 mg by mouth 2 (two) times daily.  [name] aspirin 81 MG tablet Take 81 mg by mouth daily.   [name] atorvastatin (LIPITOR) 40 MG tablet TAKE 1 TABLET AT BEDTIME (Patient taking differently: Take 40 mg by mouth at bedtime. )  . enalapril (VASOTEC) 10 MG tablet TAKE 1 TABLET DAILY  (Patient taking differently: Take 10 mg by mouth daily. )  . hydrOXYzine (ATARAX/VISTARIL) 25 MG tablet Take 25 mg by mouth every evening.  [name] ipratropium (ATROVENT) 0.03 % nasal spray Place 1-2 sprays into both nostrils 2 (two) times daily as needed (allergies).   . loratadine (CLARITIN) 10 MG tablet Take 10 mg by mouth daily.  . meclizine (ANTIVERT) 25 MG tablet Take 1 tablet (25 mg total) by mouth 3 (three) times daily as needed for dizziness.  . metoprolol succinate (TOPROL-XL) 25 MG 24 hr tablet Take 1 tablet (25 mg total) by mouth daily.  . Multiple Vitamins-Minerals (PRESERVISION/LUTEIN PO) Take by mouth.  . nitroGLYCERIN (NITROSTAT) 0.4 MG SL tablet Place 1 tablet (0.4 mg total) under the tongue as needed.  . [DISCONTINUED] Multiple Vitamins-Minerals (ICAPS AREDS 2 PO) Take 1 capsule by mouth daily.     Allergies  Allergen Reactions  . Tape Hives    Surgical tape - causes blisters  . Hydrocodone   . Iohexol      Desc: PT DOES RECALL REACTION JUST SAYS IT WAS A LONG TIME AGO FOR KIDNEY X-RAYS AND HE DIDN'T TOLERATE IT WELL-ARS 05/02/09-NOTE E-CHART STATES A HOT FEELING IS HIS REACTION, BUT HE CAN'T CONFIRM THAT WAS ALL   . Oxycodone Other (See Comments)    Shaking uncontrollably     Social History   Socioeconomic History  . Marital status: Married    Spouse name: Not on file  . Number of  children: 5  . Years of education: 70  . Highest education level: Not on file  Occupational History  . Occupation: Retired    Comment: Firefighter, Marine scientist  Tobacco Use  . Smoking status: Former Smoker    Types: Cigarettes, Cigars    Quit date: 12/14/1963    Years since quitting: 56.5  . Smokeless tobacco: Never Used  Vaping Use  . Vaping Use: Never used  Substance and Sexual Activity  . Alcohol use: No  . Drug use: No  . Sexual activity: Not on file  Other Topics Concern  . Not on file  Social History Narrative   Lives at home w/ his wife   Right-handed   Caffeine: 2 cups of  coffee per day, rare soft drink   Social Determinants of Health   Financial Resource Strain:   . Difficulty of Paying Living Expenses:   Food Insecurity:   . Worried About Charity fundraiser in the Last Year:   . Arboriculturist in the Last Year:   Transportation Needs:   . Film/video editor (Medical):   [name] Lack of Transportation (Non-Medical):   Physical Activity:   . Days of Exercise per Week:   . Minutes of Exercise per Session:   Stress:   . Feeling of Stress :   Social Connections:   . Frequency of Communication with Friends and Family:   . Frequency of Social Gatherings with Friends and Family:   . Attends Religious Services:   . Active Member of Clubs or Organizations:   . Attends Archivist Meetings:   [name] Marital Status:   Intimate Partner Violence:   . Fear of Current or Ex-Partner:   . Emotionally Abused:   [name] Physically Abused:   . Sexually Abused:      Review of Systems: General: negative for chills, fever, night sweats or weight changes.  Cardiovascular: negative for chest pain, dyspnea on exertion, edema, orthopnea, palpitations, paroxysmal nocturnal dyspnea or shortness of breath Dermatological: negative for rash Respiratory: negative for cough or wheezing Urologic: negative for hematuria Abdominal: negative for nausea, vomiting, diarrhea, bright red blood per rectum, melena, or hematemesis Neurologic: negative for visual changes, syncope, or dizziness All other systems reviewed and are otherwise negative except as noted above.    Blood pressure 100/70, pulse 69, height 5\' 7"  (1.702 m), weight 158 lb 6.4 oz (71.8 kg), SpO2 97 %.  General appearance: alert and no distress Neck: no adenopathy, no carotid bruit, no JVD, supple, symmetrical, trachea midline and thyroid not enlarged, symmetric, no tenderness/mass/nodules Lungs: clear to auscultation bilaterally Heart: regular rate and rhythm, S1, S2 normal, no murmur, click, rub or  gallop Extremities: extremities normal, atraumatic, no cyanosis or edema Pulses: 2+ and symmetric Skin: Skin color, texture, turgor normal. No rashes or lesions Neurologic: Alert and oriented X 3, normal strength and tone. Normal symmetric reflexes. Normal coordination and gait  EKG not performed today  ASSESSMENT AND PLAN:   CAD S/P percutaneous coronary angioplasty History of CAD status post RCA and ramus branch stenting by myself back in 1997.  Catheterized him 1/17/L5 revealing patent stent with 60% mid LAD lesion and normal LV function.  His Myoview performed 08/04/2010 revealed lateral scar and catheterization performed several weeks prior to that revealed patent stents with noncritical CAD.  Because of some recent chest pain he underwent Myoview stress testing 05/07/2020 that showed lateral scar unchanged from priors test.  His pain is somewhat atypical and not the  same as his prior pain.      Lorretta Harp MD FACP,FACC,FAHA, [facility] 06/26/2020 12:15 PM

## 2020-06-26 NOTE — Patient Instructions (Signed)
Medication Instructions:  NO CHANGE *If you need a refill on your cardiac medications before your next appointment, please call your pharmacy*   Lab Work: If you have labs (blood work) drawn today and your tests are completely normal, you will receive your results only by: [name] MyChart Message (if you have MyChart) OR . A paper copy in the mail If you have any lab test that is abnormal or we need to change your treatment, we will call you to review the results.   Follow-Up: At [facility], you and your health needs are our priority.  As part of our continuing mission to provide you with exceptional heart care, we have created designated Provider Care Teams.  These Care Teams include your primary Cardiologist (physician) and Advanced Practice Providers (APPs -  Physician Assistants and Nurse Practitioners) who all work together to provide you with the care you need, when you need it.  We recommend signing up for the patient portal called "MyChart".  Sign up information is provided on this After Visit Summary.  MyChart is used to connect with patients for Virtual Visits (Telemedicine).  Patients are able to view lab/test results, encounter notes, upcoming appointments, etc.  Non-urgent messages can be sent to your provider as well.   To learn more about what you can do with MyChart, go to [email or web address].    Your next appointment:    Your physician recommends that you schedule a follow-up appointment in: [place] physician wants you to follow-up in: [place] will receive a reminder letter in the mail two months in advance. If you don't receive a letter, please call our office to schedule the follow-up appointment.

## 2020-06-26 NOTE — Assessment & Plan Note (Signed)
History of CAD status post RCA and ramus branch stenting by myself back in 1997.  Catheterized him 1/17/L5 revealing patent stent with 60% mid LAD lesion and normal LV function.  His Myoview performed 08/04/2010 revealed lateral scar and catheterization performed several weeks prior to that revealed patent stents with noncritical CAD.  Because of some recent chest pain he underwent Myoview stress testing 05/07/2020 that showed lateral scar unchanged from priors test.  His pain is somewhat atypical and not the same as his prior pain.

## 2020-06-29 DIAGNOSIS — Z09 Encounter for follow-up examination after completed treatment for conditions other than malignant neoplasm: Secondary | ICD-10-CM | POA: Diagnosis not present

## 2020-06-29 DIAGNOSIS — R519 Headache, unspecified: Secondary | ICD-10-CM | POA: Diagnosis not present

## 2020-06-29 DIAGNOSIS — I10 Essential (primary) hypertension: Secondary | ICD-10-CM | POA: Diagnosis not present

## 2020-06-29 DIAGNOSIS — R7301 Impaired fasting glucose: Secondary | ICD-10-CM | POA: Diagnosis not present

## 2020-06-29 DIAGNOSIS — E7800 Pure hypercholesterolemia, unspecified: Secondary | ICD-10-CM | POA: Diagnosis not present

## 2020-06-29 DIAGNOSIS — R42 Dizziness and giddiness: Secondary | ICD-10-CM | POA: Diagnosis not present

## 2020-07-06 DIAGNOSIS — M62830 Muscle spasm of back: Secondary | ICD-10-CM | POA: Diagnosis not present

## 2020-07-06 DIAGNOSIS — M9902 Segmental and somatic dysfunction of thoracic region: Secondary | ICD-10-CM | POA: Diagnosis not present

## 2020-07-06 DIAGNOSIS — M9901 Segmental and somatic dysfunction of cervical region: Secondary | ICD-10-CM | POA: Diagnosis not present

## 2020-07-06 DIAGNOSIS — G44209 Tension-type headache, unspecified, not intractable: Secondary | ICD-10-CM | POA: Diagnosis not present

## 2020-07-06 DIAGNOSIS — M5411 Radiculopathy, occipito-atlanto-axial region: Secondary | ICD-10-CM | POA: Diagnosis not present

## 2020-07-20 ENCOUNTER — Other Ambulatory Visit: Payer: Self-pay

## 2020-07-20 ENCOUNTER — Encounter: Payer: Self-pay | Admitting: Physical Therapy

## 2020-07-20 ENCOUNTER — Ambulatory Visit: Payer: Medicare Other | Attending: Family Medicine | Admitting: Physical Therapy

## 2020-07-20 VITALS — BP 127/70 | HR 71

## 2020-07-20 DIAGNOSIS — R2681 Unsteadiness on feet: Secondary | ICD-10-CM | POA: Insufficient documentation

## 2020-07-20 DIAGNOSIS — R42 Dizziness and giddiness: Secondary | ICD-10-CM | POA: Diagnosis not present

## 2020-07-20 DIAGNOSIS — R2689 Other abnormalities of gait and mobility: Secondary | ICD-10-CM | POA: Diagnosis not present

## 2020-07-20 NOTE — Therapy (Signed)
[facility] [address] [place], [place], [address] Phone: [phone]   Fax:  [phone]  Physical Therapy Evaluation  Patient Details  Name: Donald Todd MRN: [id number] Date of Birth: [birth year] Referring Provider (PT): Dr. [name]   Encounter Date: 07/20/2020   PT End of Session - 07/20/20 1506    Visit Number 1    Number of Visits 5    Date for PT Re-Evaluation 08/28/20    Authorization Type Medicare/Tricare    Authorization Time Period 07-20-20 - 09-19-20    PT Start Time 1332    PT Stop Time 1425    PT Time Calculation (min) 53 min    Activity Tolerance Patient tolerated treatment well    Behavior During Therapy [facility] for tasks assessed/performed           Past Medical History:  Diagnosis Date  . Arthritis   . CAD (coronary artery disease)    last cath. 07-09-2010  . Cancer [facility])    kidney cancer , prostate cancer   . Chronic kidney disease    lt kidnet removed  . H/O cardiovascular stress test 08-04-2010   ef 55% NO ISCHEMIA  . H/O unilateral nephrectomy    lt. nephrectomy  . Heart attack ([place]) 1993  . History of kidney stones   . Hyperlipemia   . Hypertension    renal dopplers 218.13-normal patency  . Inguinal hernia 08/01/11   right  . Macular degeneration   . Prostate disease    Cancer S/P seed implant  . Vertigo     Past Surgical History:  Procedure Laterality Date  . ANGIOPLASTY  1993  . athroscopic knee surgery  2002   right  . bone scan  2005  . CARDIAC CATHETERIZATION  07-09-2010   normal left ventricular function, coronary obsstructive disease with 20% proximal an 30-40% mid lt. anterior descending narrowing ; widely patent stent in the proximal ramus intermediate vessel; 50-70-% narrowing in the mid circumflex,and widely patent stent  in the rt. coronary   . CARDIAC CATHETERIZATION  12-22-2003   widely patent stents with noncritical coronary artery disease and normalLV  function  . CHOLECYSTECTOMY  05/05/2009   Laparoscopic  . colonscopy  2004   with biopsy  . CORONARY ANGIOPLASTY WITH STENT PLACEMENT  03-15-1996   pci to rci and ramus branch using j&j [id number] stent,post dialated  at  20atm (rca)  and 14 atm (ramus branch. ef 55%            . HERNIA REPAIR  08/01/11   RIH  . INGUINAL HERNIA REPAIR Left 03/29/2013   Procedure: HERNIA REPAIR INGUINAL ADULT;  Surgeon: Edward Jolly, MD;  Location: [facility];  Service: General;  Laterality: Left;  . INSERTION OF MESH Left 03/29/2013   Procedure: INSERTION OF MESH;  Surgeon: Edward Jolly, MD;  Location: [facility];  Service: General;  Laterality: Left;  . JOINT REPLACEMENT  2006 and 2010   left 2006, right 2010  . KIDNEY SURGERY  2002 and 2005   2002 - partial removal of left. 2005 complete removal  . PROSTATE BIOPSY    . prostate seed implant    . radiation treatment  2002   prostate  . REVERSE SHOULDER ARTHROPLASTY Left 05/26/2017   Procedure: REVERSE LEFT TOTAL SHOULDER ARTHROPLASTY;  Surgeon: Netta Cedars, MD;  Location: [facility];  Service: Orthopedics;  Laterality: Left;  . stent implants  1997    Vitals:  07/20/20 1404  BP: 127/70  Pulse: 71   BP in standing 121/64;  Pulse 79    Subjective Assessment - 07/20/20 1340    Subjective Pt states he woke up around 3 am on 06-18-20 with dizziness but was able to make it to the bathroom; woke up again around 6 and was very dizzy again; wife called 911 and pt was transported to [facility] ED via EMS; CVA was ruled out.  (MRI & CT were negative);  pt was given Meclizine which helped some; Pt was evaluated by PT and no nystagmus was noted and no vertigo reported.  Pt was instructed to follow up with his cardiologist.  Pt states he usually gets a headache with Lt sided neck pain that preceeds the dizziness.   pt states he took Meclizine prior to today's appt   Patient is accompained by: Family member   [name]   Pertinent History AMD, CAD s/p percutaneous coronary  angioplasty, essential HEN, chronic renal impairment stage 3; s/p Lt shoulder replacement    Patient Stated Goals get rid of the headaches and the vertigo    Currently in Pain? No/denies              [facility] PT Assessment - 07/20/20 1345      Assessment   Medical Diagnosis Vertigo    Referring Provider (PT) Dr. [name]    Onset Date/Surgical Date 06/18/20      Precautions   Precautions Fall      Balance Screen   Has the patient fallen in the past 6 months No    Has the patient had a decrease in activity level because of a fear of falling?  No    Is the patient reluctant to leave their home because of a fear of falling?  No      Prior Function   Level of Independence Independent      Ambulation/Gait   Ambulation/Gait Yes    Ambulation/Gait Assistance 5: Supervision    Ambulation Distance (Feet) 100 Feet    Assistive device Straight cane   with rubber quad tip   Gait Pattern Step-through pattern    Ambulation Surface Level;Indoor      Standardized Balance Assessment   Standardized Balance Assessment Timed Up and Go Test      Timed Up and Go Test   TUG Normal TUG    Normal TUG (seconds) 24.57   mild c/o dizziness with turning around to sit in chair; [facility]                 Vestibular Assessment - 07/20/20 0001      Vestibular Assessment   General Observation pt reports he has tinnitus occasionally; pt denies nausea but he does have hunger pains:   pt states he usually has a headache which preceeds the dizziness      Symptom Behavior   Subjective history of current problem pt reports episode of vertigo occurred on 06-18-20 - went to ED; no specific etiology determined;  pt states he has been taking 1-2 tablets of the Meclizine daily since 06-18-20    Type of Dizziness  Lightheadedness   headaches   Frequency of Dizziness daily    Duration of Dizziness varies - sometimes it lasts for a few hours     Symptom Nature Motion provoked    Aggravating Factors  Turning head quickly;Turning body quickly    Relieving Factors Lying supine;Rest    Progression of Symptoms Better  Oculomotor Exam   Oculomotor Alignment Normal    Spontaneous Absent    Smooth Pursuits Intact    Saccades Intact;Comment   pt has macular degeneration     Positional Testing   Dix-Hallpike Dix-Hallpike Right;Dix-Hallpike Left    Sidelying Test Sidelying Right;Sidelying Left      Dix-Hallpike Right   Dix-Hallpike Right Duration none    Dix-Hallpike Right Symptoms No nystagmus      Dix-Hallpike Left   Dix-Hallpike Left Duration none    Dix-Hallpike Left Symptoms No nystagmus   light-headedness reported with return to upright     Sidelying Right   Sidelying Right Duration none    Sidelying Right Symptoms No nystagmus   light headedness reported with return to upright     Sidelying Left   Sidelying Left Duration none    Sidelying Left Symptoms No nystagmus      Positional Sensitivities   Supine to Sitting Mild dizziness    Up from Right Hallpike Mild dizziness    Up from Left Hallpike Mild dizziness              Objective measurements completed on examination: See above findings.               PT Education - 07/20/20 1936    Education Details pt instructed to increase water intake for good hydration    Person(s) Educated Patient;Spouse    Methods Explanation;Demonstration    Comprehension Verbalized understanding            PT Short Term Goals - 07/20/20 2004      PT SHORT TERM GOAL #1   Title same as LTG's             PT Long Term Goals - 07/20/20 2005      PT LONG TERM GOAL #1   Title Pt will report at least 25% improvement in vertigo.    Time 4    Period Weeks    Status New    Target Date 08/21/20      PT LONG TERM GOAL #2   Title Improve TUG score from 24.57 secs to </= 20 secs with SPC for reduced fall risk.    Baseline 24.57 secs    Time 4    Period Weeks    Status New    Target Date 08/21/20      PT LONG  TERM GOAL #3   Title Pt will be independent in HEP for balance and vestibular exercises.    Time 4    Period Weeks    Status New    Target Date 08/21/20      PT LONG TERM GOAL #4   Title Pt will amb. 16' with head turns without LOB with SBA for incr. safety with environmental scanning.    Time 4    Period Weeks    Status New    Target Date 08/21/20                  Plan - 07/20/20 1939    Clinical Impression Statement Pt is an 86 yr old gentleman with  c/o headache and Lt sided neck pain that he states preceeds onset of vertigo (pt reported no pain at today's eval); pt presented [facility] ED at [facility] on [facility]-18-20 due to vertigo.  MRI and CT scan were negative; pt was given Meclizine which was reported to have helped somewhat.  Pt was evaluated by PT but no nystagmus was noted -  etiology of vertigo unknown.  Pt's symptoms do not appear to be indicatove of BPPV at this time, however, pt tood Meclizine prior to today's appt so symptoms may be masked. Pt does report light-headedness with supine to sit but BP readings do not indicate orthostaic hypotension with sit to stand transfer.  Pt demonstrates balance deficits and he is using a SPC with rubber quad tip for assistance with ambulation.  Pt is able to perform Romberg EO with SBA for approx. 10 secs.  Pt will benefit from PT to address gait, balance and vestibular deficits.    Personal Factors and Comorbidities Age;Comorbidity 2    Comorbidities CAD (last cath 2011), macular degeneration, HTN, chronic kidney disease    Examination-Activity Limitations Bend;Transfers;Locomotion Level;Squat    Examination-Participation Restrictions Shop;Meal Prep;Community Activity;Cleaning    Stability/Clinical Decision Making Evolving/Moderate complexity    Clinical Decision Making Moderate    Rehab Potential Good    PT Frequency 1x / week    PT Duration 4 weeks    PT Treatment/Interventions ADLs/Self Care Home Management;Vestibular;Therapeutic  exercise;Balance training;Therapeutic activities;Functional mobility training;Gait training;Stair training    PT Next Visit Plan do full orthostatics assessment: recheck for BPPV as pt has been instructed to not take mecizine prior to appt; begin HEP for balance/vestibular exercises - standing with EC, turning for habituation and neck stretches    Consulted and Agree with Plan of Care Patient;Family member/caregiver    Family Member Consulted [name]           Patient will benefit from skilled therapeutic intervention in order to improve the following deficits and impairments:  Difficulty walking, Dizziness, Decreased balance, Impaired vision/preception  Visit Diagnosis: Dizziness and giddiness - Plan: PT plan of care cert/re-cert  Other abnormalities of gait and mobility - Plan: PT plan of care cert/re-cert  Unsteadiness on feet - Plan: PT plan of care cert/re-cert     Problem List Patient Active Problem List   Diagnosis Date Noted  . Vertigo 06/18/2020  . S/P shoulder replacement, left 05/26/2017  . Syncope and collapse 04/03/2017  . Abnormality of gait 09/07/2016  . History of nephrectomy- Lt 06/16/2014  . Chronic renal impairment, stage 3 (moderate) 06/16/2014  . CAD S/P percutaneous coronary angioplasty 05/15/2013  . Essential hypertension 05/15/2013  . Dyslipidemia 05/15/2013    Alda Lea, PT 07/20/2020, 8:17 PM  [facility] [address] [place], [place], [address] Phone: [phone]   Fax:  [phone]  Name: ZANDEN COLVER MRN: [id number] Date of Birth: August 08, 1929

## 2020-07-21 ENCOUNTER — Other Ambulatory Visit: Payer: Self-pay | Admitting: Cardiovascular Disease

## 2020-07-22 NOTE — Telephone Encounter (Signed)
Rx has been sent to the pharmacy electronically. ° °

## 2020-08-03 ENCOUNTER — Other Ambulatory Visit: Payer: Self-pay

## 2020-08-03 ENCOUNTER — Ambulatory Visit: Payer: Medicare Other

## 2020-08-03 DIAGNOSIS — R42 Dizziness and giddiness: Secondary | ICD-10-CM

## 2020-08-03 DIAGNOSIS — R2689 Other abnormalities of gait and mobility: Secondary | ICD-10-CM | POA: Diagnosis not present

## 2020-08-03 DIAGNOSIS — R2681 Unsteadiness on feet: Secondary | ICD-10-CM | POA: Diagnosis not present

## 2020-08-03 NOTE — Patient Instructions (Signed)
Access Code: [facility] URL: [email or web address] Date: 08/03/2020 Prepared by: Baldomero Lamy  Exercises Romberg Stance with Eyes Closed - 2 x daily - 5 x weekly - 1 sets - 3 reps - 15-30 seconds hold Romberg Stance with Head Nods - 2 x daily - 5 x weekly - 2 sets - 10 reps Romberg Stance with Head Rotation - 2 x daily - 5 x weekly - 2 sets - 10 reps

## 2020-08-03 NOTE — Therapy (Signed)
[facility] [address] [place], [place], [address] Phone: [phone]   Fax:  [phone]  Physical Therapy Treatment  Patient Details  Name: Donald Todd MRN: [id number] Date of Birth: 1928/12/19 Referring Provider (PT): Dr. [name]   Encounter Date: 08/03/2020   PT End of Session - 08/03/20 1406    Visit Number 2    Number of Visits 5    Date for PT Re-Evaluation 08/28/20    Authorization Type Medicare/Tricare    Authorization Time Period 07-20-20 - 09-19-20    PT Start Time 1401    PT Stop Time 1444    PT Time Calculation (min) 43 min    Equipment Utilized During Treatment Gait belt    Activity Tolerance Patient tolerated treatment well    Behavior During Therapy [facility] for tasks assessed/performed           Past Medical History:  Diagnosis Date  . Arthritis   . CAD (coronary artery disease)    last cath. 07-09-2010  . Cancer [facility])    kidney cancer , prostate cancer   . Chronic kidney disease    lt kidnet removed  . H/O cardiovascular stress test 08-04-2010   ef 55% NO ISCHEMIA  . H/O unilateral nephrectomy    lt. nephrectomy  . Heart attack ([place]) 1993  . History of kidney stones   . Hyperlipemia   . Hypertension    renal dopplers 218.13-normal patency  . Inguinal hernia 08/01/11   right  . Macular degeneration   . Prostate disease    Cancer S/P seed implant  . Vertigo     Past Surgical History:  Procedure Laterality Date  . ANGIOPLASTY  1993  . athroscopic knee surgery  2002   right  . bone scan  2005  . CARDIAC CATHETERIZATION  07-09-2010   normal left ventricular function, coronary obsstructive disease with 20% proximal an 30-40% mid lt. anterior descending narrowing ; widely patent stent in the proximal ramus intermediate vessel; 50-70-% narrowing in the mid circumflex,and widely patent stent  in the rt. coronary   . CARDIAC CATHETERIZATION  12-22-2003   widely patent stents with  noncritical coronary artery disease and normalLV function  . CHOLECYSTECTOMY  05/05/2009   Laparoscopic  . colonscopy  2004   with biopsy  . CORONARY ANGIOPLASTY WITH STENT PLACEMENT  03-15-1996   pci to rci and ramus branch using j&j [id number] stent,post dialated  at  20atm (rca)  and 14 atm (ramus branch. ef 55%            . HERNIA REPAIR  08/01/11   RIH  . INGUINAL HERNIA REPAIR Left 03/29/2013   Procedure: HERNIA REPAIR INGUINAL ADULT;  Surgeon: Edward Jolly, MD;  [facility];  Service: General;  Laterality: Left;  . INSERTION OF MESH Left 03/29/2013   Procedure: INSERTION OF MESH;  Surgeon: Edward Jolly, MD;  [facility];  Service: General;  Laterality: Left;  . JOINT REPLACEMENT  2006 and 2010   left 2006, right 2010  . KIDNEY SURGERY  2002 and 2005   2002 - partial removal of left. 2005 complete removal  . PROSTATE BIOPSY    . prostate seed implant    . radiation treatment  2002   prostate  . REVERSE SHOULDER ARTHROPLASTY Left 05/26/2017   Procedure: REVERSE LEFT TOTAL SHOULDER ARTHROPLASTY;  Surgeon: Netta Cedars, MD;  Location: [facility];  Service: Orthopedics;  Laterality: Left;  . stent  implants  1997    There were no vitals filed for this visit.   Subjective Assessment - 08/03/20 1406    Subjective Patient reports no new changes. Did not taken meclizine since last PT visit. Still reports headaches, has one currently.   pt states he took Meclizine prior to today's appt   Patient is accompained by: Family member   wife Donald Todd   Pertinent History AMD, CAD s/p percutaneous coronary angioplasty, essential HEN, chronic renal impairment stage 3; s/p Lt shoulder replacement    Patient Stated Goals get rid of the headaches and the vertigo    Currently in Pain? Yes    Pain Score 3     Pain Location Head    Pain Orientation --   front (around forehead)   Pain Descriptors / Indicators Aching    Pain Type Acute pain    Pain Onset Today                    Vestibular Assessment - 08/03/20 0001      Positional Testing   Dix-Hallpike Dix-Hallpike Right;Dix-Hallpike Left      Dix-Hallpike Right   Dix-Hallpike Right Duration none    Dix-Hallpike Right Symptoms No nystagmus      Dix-Hallpike Left   Dix-Hallpike Left Duration none    Dix-Hallpike Left Symptoms No nystagmus      Orthostatics   BP supine (x 5 minutes) 141/68    HR supine (x 5 minutes) 64    BP sitting 120/71    HR sitting 69    BP standing (after 1 minute) 144/76    HR standing (after 1 minute) 73    BP standing (after 3 minutes) 151/74    HR standing (after 3 minutes) 74    Orthostatics Comment patient had lightheadness from supine <> sit transition. require UE support from mat to steady oneself.                     [facility] Adult PT Treatment/Exercise - 08/03/20 0001      Ambulation/Gait   Ambulation/Gait Yes    Ambulation/Gait Assistance 5: Supervision    Ambulation/Gait Assistance Details throughout therapy gym during session    Ambulation Distance (Feet) --   clinic distances   Assistive device Straight cane    Gait Pattern Step-through pattern    Ambulation Surface Level;Indoor      Self-Care   Self-Care Other Self-Care Comments    Other Self-Care Comments  PT educating on orthostatic hypotension, and to give self 30-1 minute prior to standing/getting out of bed. Also educating on options of completing LAQ/alternating marching with BLE's to get blood flow going and hopes to combat drop in BP.       Neuro Re-ed    Neuro Re-ed Details  Initiated balance exercises and educated on proper completion at home. Including completing in a corner within home with a chair placed in front for safety.            Access Code: [facility] URL: [email or web address] Date: 08/03/2020 Prepared by: Baldomero Lamy  Exercises Romberg Stance with Eyes Closed - 2 x daily - 5 x weekly - 1 sets - 3 reps - 15-30 seconds hold Romberg Stance with Head Nods - 2 x  daily - 5 x weekly - 2 sets - 10 reps Romberg Stance with Head Rotation - 2 x daily - 5 x weekly - 2 sets - 10 reps      Balance Exercises -  08/03/20 0001      Balance Exercises: Standing   Standing Eyes Opened Narrow base of support (BOS);Foam/compliant surface;Head turns;Limitations    Standing Eyes Opened Limitations completed 2 x 10 reps of horizontal/vertical head turns. increased balacne challenge with horizontal.     Standing Eyes Closed Narrow base of support (BOS);Foam/compliant surface;3 reps;Limitations;Time    Standing Eyes Closed Time 15-20 seconds    Standing Eyes Closed Limitations increased balance challenge with vision removed. require intermittent UE support.              PT Education - 08/03/20 1448    Education Details Orthostatic Hypotension; Initial Balance HEP    Person(s) Educated Patient    Methods Explanation;Demonstration;Handout    Comprehension Verbalized understanding;Returned demonstration            PT Short Term Goals - 07/20/20 2004      PT SHORT TERM GOAL #1   Title same as LTG's             PT Long Term Goals - 07/20/20 2005      PT LONG TERM GOAL #1   Title Pt will report at least 25% improvement in vertigo.    Time 4    Period Weeks    Status New    Target Date 08/21/20      PT LONG TERM GOAL #2   Title Improve TUG score from 24.57 secs to </= 20 secs with SPC for reduced fall risk.    Baseline 24.57 secs    Time 4    Period Weeks    Status New    Target Date 08/21/20      PT LONG TERM GOAL #3   Title Pt will be independent in HEP for balance and vestibular exercises.    Time 4    Period Weeks    Status New    Target Date 08/21/20      PT LONG TERM GOAL #4   Title Pt will amb. 66' with head turns without LOB with SBA for incr. safety with environmental scanning.    Time 4    Period Weeks    Status New    Target Date 08/21/20                 Plan - 08/03/20 1528    Clinical Impression Statement  Today's skilled PT session included assessment of orthostatic's, with patient dmeonstrating 21 mmHg drop in systolic BP from supine <> sitting and reports of lightheadness. No other drop in BP noted upon assessment. Reassess R and L dix-hallpike with no nystagmus and dizziness noted. Initiated balance HEP as tolerated by patient. will continue to progress toward goals.    Personal Factors and Comorbidities Age;Comorbidity 2    Comorbidities CAD (last cath 2011), macular degeneration, HTN, chronic kidney disease    Examination-Activity Limitations Bend;Transfers;Locomotion Level;Squat    Examination-Participation Restrictions Shop;Meal Prep;Community Activity;Cleaning    Stability/Clinical Decision Making Evolving/Moderate complexity    Rehab Potential Good    PT Frequency 1x / week    PT Duration 4 weeks    PT Treatment/Interventions ADLs/Self Care Home Management;Vestibular;Therapeutic exercise;Balance training;Therapeutic activities;Functional mobility training;Gait training;Stair training    PT Next Visit Plan How was HEP? complete balance and update HEP as tolerated.    Consulted and Agree with Plan of Care Patient;Family member/caregiver    Family Member Consulted wife Donald Todd           Patient will benefit from skilled therapeutic intervention in order  to improve the following deficits and impairments:  Difficulty walking, Dizziness, Decreased balance, Impaired vision/preception  Visit Diagnosis: Dizziness and giddiness  Other abnormalities of gait and mobility  Unsteadiness on feet     Problem List Patient Active Problem List   Diagnosis Date Noted  . Vertigo 06/18/2020  . S/P shoulder replacement, left 05/26/2017  . Syncope and collapse 04/03/2017  . Abnormality of gait 09/07/2016  . History of nephrectomy- Lt 06/16/2014  . Chronic renal impairment, stage 3 (moderate) 06/16/2014  . CAD S/P percutaneous coronary angioplasty 05/15/2013  . Essential hypertension 05/15/2013   . Dyslipidemia 05/15/2013    Jones Bales, PT, DPT 08/03/2020, 3:42 PM  [facility] [address] [place], [place], [address] Phone: [phone]   Fax:  [phone]  Name: Donald Todd MRN: [id number] Date of Birth: 01-08-1929

## 2020-08-06 DIAGNOSIS — H353211 Exudative age-related macular degeneration, right eye, with active choroidal neovascularization: Secondary | ICD-10-CM | POA: Diagnosis not present

## 2020-08-12 ENCOUNTER — Ambulatory Visit: Payer: Medicare Other | Attending: Family Medicine

## 2020-08-12 ENCOUNTER — Other Ambulatory Visit: Payer: Self-pay

## 2020-08-12 DIAGNOSIS — R2681 Unsteadiness on feet: Secondary | ICD-10-CM | POA: Diagnosis not present

## 2020-08-12 DIAGNOSIS — R2689 Other abnormalities of gait and mobility: Secondary | ICD-10-CM | POA: Diagnosis not present

## 2020-08-12 DIAGNOSIS — R42 Dizziness and giddiness: Secondary | ICD-10-CM | POA: Diagnosis not present

## 2020-08-12 NOTE — Therapy (Signed)
[facility] [address] [place], [place], [address] Phone: [phone]   Fax:  [phone]  Physical Therapy Treatment  Patient Details  Name: Donald Todd MRN: [id number] Date of Birth: [birth year] Referring Provider (PT): Donald Todd   Encounter Date: 08/12/2020   PT End of Session - 08/12/20 1406    Visit Number 3    Number of Visits 5    Date for PT Re-Evaluation 08/28/20    Authorization Type Medicare/Tricare    Authorization Time Period 07-20-20 - 09-19-20    PT Start Time 1401    PT Stop Time 1444    PT Time Calculation (min) 43 min    Equipment Utilized During Treatment Gait belt    Activity Tolerance Patient tolerated treatment well    Behavior During Therapy [facility] for tasks assessed/performed           Past Medical History:  Diagnosis Date  . Arthritis   . CAD (coronary artery disease)    last cath. 07-09-2010  . Cancer [facility])    kidney cancer , prostate cancer   . Chronic kidney disease    lt kidnet removed  . H/O cardiovascular stress test 08-04-2010   ef 55% NO ISCHEMIA  . H/O unilateral nephrectomy    lt. nephrectomy  . Heart attack ([place]) 1993  . History of kidney stones   . Hyperlipemia   . Hypertension    renal dopplers 218.13-normal patency  . Inguinal hernia 08/01/11   right  . Macular degeneration   . Prostate disease    Cancer S/P seed implant  . Vertigo     Past Surgical History:  Procedure Laterality Date  . ANGIOPLASTY  1993  . athroscopic knee surgery  2002   right  . bone scan  2005  . CARDIAC CATHETERIZATION  07-09-2010   normal left ventricular function, coronary obsstructive disease with 20% proximal an 30-40% mid lt. anterior descending narrowing ; widely patent stent in the proximal ramus intermediate vessel; 50-70-% narrowing in the mid circumflex,and widely patent stent  in the rt. coronary   . CARDIAC CATHETERIZATION  12-22-2003   widely patent stents with  noncritical coronary artery disease and normalLV function  . CHOLECYSTECTOMY  05/05/2009   Laparoscopic  . colonscopy  2004   with biopsy  . CORONARY ANGIOPLASTY WITH STENT PLACEMENT  03-15-1996   pci to rci and ramus branch using j&j [id number] stent,post dialated  at  20atm (rca)  and 14 atm (ramus branch. ef 55%            . HERNIA REPAIR  08/01/11   RIH  . INGUINAL HERNIA REPAIR Left 03/29/2013   Procedure: HERNIA REPAIR INGUINAL ADULT;  Surgeon: Donald Jolly, MD;  Location: [facility];  Service: General;  Laterality: Left;  . INSERTION OF MESH Left 03/29/2013   Procedure: INSERTION OF MESH;  Surgeon: Donald Jolly, MD;  Location: [facility];  Service: General;  Laterality: Left;  . JOINT REPLACEMENT  2006 and 2010   left 2006, right 2010  . KIDNEY SURGERY  2002 and 2005   2002 - partial removal of left. 2005 complete removal  . PROSTATE BIOPSY    . prostate seed implant    . radiation treatment  2002   prostate  . REVERSE SHOULDER ARTHROPLASTY Left 05/26/2017   Procedure: REVERSE LEFT TOTAL SHOULDER ARTHROPLASTY;  Surgeon: Donald Cedars, MD;  Location: [facility];  Service: Orthopedics;  Laterality: Left;  . stent  implants  1997    There were no vitals filed for this visit.   Subjective Assessment - 08/12/20 1405    Subjective Patient reports no new changes/complaints since last visits. No falls. Patietn reports still intermittent headaches. Patient reports no dizziness since last visit.   pt states he took Meclizine prior to today's appt   Patient is accompained by: Family member   wife Donald [facility]   Pertinent History AMD, CAD s/p percutaneous coronary angioplasty, essential HEN, chronic renal impairment stage 3; s/p Lt shoulder replacement    Patient Stated Goals get rid of the headaches and the vertigo    Currently in Pain? No/denies                             [facility] Adult PT Treatment/Exercise - 08/12/20 0001      Ambulation/Gait   Ambulation/Gait Yes     Ambulation/Gait Assistance 5: Supervision    Ambulation/Gait Assistance Details throughout therapy gym with activities (see high level balance for details)    Ambulation Distance (Feet) --   clinic distances   Assistive device None;Straight cane    Gait Pattern Step-through pattern    Ambulation Surface Level;Indoor      High Level Balance   High Level Balance Activities Head turns    High Level Balance Comments Completed ambulation 2 x 115 ft with horizontal and vertical head turns. compelted without AD and PT providing CGA. increased difficulty noted with horizontal > vertical head turns.       Neuro Re-ed    Neuro Re-ed Details  Compelted alternating toe taps to pebbles, 2 x 10 reps forward. progressed to completing crossover toe taps x 15 reps. PT providing verbal cues for foot position, as patient often continues to narrow BOS support with completion. All completed without UE support.                Balance Exercises - 08/12/20 0001      Balance Exercises: Standing   Standing Eyes Opened Narrow base of support (BOS);Wide (BOA);Head turns;Foam/compliant surface    Standing Eyes Opened Limitations compelted 2 x 10 reps of horizontal/vertical head turns. increased balance challenge on airex pad. patient had BLE shaking and reports felt like knees may give out therefore requiring UE support intermittently.     Standing Eyes Closed Narrow base of support (BOS);3 reps;Limitations;Time;Foam/compliant surface;Solid surface    Standing Eyes Closed Time 15-20 seconds    Standing Eyes Closed Limitations increase balance challenge with vision removed    Tandem Stance Eyes open;Intermittent upper extremity support;3 reps;Time    Tandem Stance Limitations 15-20 secs. Increased difficulty noted requiring modified tandem stance.     Tandem Gait Forward;Intermittent upper extremity support;5 reps;Limitations    Tandem Gait Limitations completed tandem gait at countertop, initially completed x 2  reps down and back with single UE support. Progressed to compelting x 3 reps down and back without UE support, increase CGA required from PT. increased balance challenge noted.              PT Education - 08/12/20 1444    Education Details HEP Update (tandem stance)    Person(s) Educated Patient    Methods Explanation;Demonstration;Handout    Comprehension Verbalized understanding;Returned demonstration            PT Short Term Goals - 07/20/20 2004      PT SHORT TERM GOAL #1   Title same as LTG's  PT Long Term Goals - 07/20/20 2005      PT LONG TERM GOAL #1   Title Pt will report at least 25% improvement in vertigo.    Time 4    Period Weeks    Status New    Target Date 08/21/20      PT LONG TERM GOAL #2   Title Improve TUG score from 24.57 secs to </= 20 secs with SPC for reduced fall risk.    Baseline 24.57 secs    Time 4    Period Weeks    Status New    Target Date 08/21/20      PT LONG TERM GOAL #3   Title Pt will be independent in HEP for balance and vestibular exercises.    Time 4    Period Weeks    Status New    Target Date 08/21/20      PT LONG TERM GOAL #4   Title Pt will amb. 5' with head turns without LOB with SBA for incr. safety with environmental scanning.    Time 4    Period Weeks    Status New    Target Date 08/21/20                 Plan - 08/12/20 1444    Clinical Impression Statement Today's skilled session included continued balance exercises in standing with patient tolerating well, patient still demos increased difficulty with vision removed on complaint surfaces. No reports of dizziness or lightheadedness throughout session. Will continue to progress.    Personal Factors and Comorbidities Age;Comorbidity 2    Comorbidities CAD (last cath 2011), macular degeneration, HTN, chronic kidney disease    Examination-Activity Limitations Bend;Transfers;Locomotion Level;Squat    Examination-Participation Restrictions  Shop;Meal Prep;Community Activity;Cleaning    Stability/Clinical Decision Making Evolving/Moderate complexity    Rehab Potential Good    PT Frequency 1x / week    PT Duration 4 weeks    PT Treatment/Interventions ADLs/Self Care Home Management;Vestibular;Therapeutic exercise;Balance training;Therapeutic activities;Functional mobility training;Gait training;Stair training    PT Next Visit Plan Update HEP as needed with progressive balance exercises. High Level walking (head turns, tandem gait)    Consulted and Agree with Plan of Care Patient;Family member/caregiver    Family Member Consulted wife Donald Todd           Patient will benefit from skilled therapeutic intervention in order to improve the following deficits and impairments:  Difficulty walking, Dizziness, Decreased balance, Impaired vision/preception  Visit Diagnosis: Dizziness and giddiness  Other abnormalities of gait and mobility  Unsteadiness on feet     Problem List Patient Active Problem List   Diagnosis Date Noted  . Vertigo 06/18/2020  . S/P shoulder replacement, left 05/26/2017  . Syncope and collapse 04/03/2017  . Abnormality of gait 09/07/2016  . History of nephrectomy- Lt 06/16/2014  . Chronic renal impairment, stage 3 (moderate) 06/16/2014  . CAD S/P percutaneous coronary angioplasty 05/15/2013  . Essential hypertension 05/15/2013  . Dyslipidemia 05/15/2013    Jones Bales, PT, DPT 08/12/2020, 2:46 PM  [facility] [address] [place], [place], [address] Phone: [phone]   Fax:  [phone]  Name: Donald Todd MRN: [id number] Date of Birth: 26-Sep-1929

## 2020-08-12 NOTE — Patient Instructions (Signed)
Access Code: [facility] URL: [email or web address] Date: 08/12/2020 Prepared by: Baldomero Lamy  Exercises Romberg Stance with Eyes Closed - 2 x daily - 5 x weekly - 1 sets - 3 reps - 15-30 seconds hold Romberg Stance with Head Nods - 2 x daily - 5 x weekly - 2 sets - 10 reps Romberg Stance with Head Rotation - 2 x daily - 5 x weekly - 2 sets - 10 reps Tandem Stance - 1 x daily - 5 x weekly - 1 sets - 3 reps - 15-30 seconds hold

## 2020-08-18 ENCOUNTER — Ambulatory Visit: Payer: Medicare Other | Admitting: Physical Therapy

## 2020-08-18 ENCOUNTER — Other Ambulatory Visit: Payer: Self-pay

## 2020-08-18 VITALS — BP 131/71

## 2020-08-18 DIAGNOSIS — R2681 Unsteadiness on feet: Secondary | ICD-10-CM | POA: Diagnosis not present

## 2020-08-18 DIAGNOSIS — R42 Dizziness and giddiness: Secondary | ICD-10-CM

## 2020-08-18 DIAGNOSIS — R2689 Other abnormalities of gait and mobility: Secondary | ICD-10-CM

## 2020-08-19 ENCOUNTER — Encounter: Payer: Self-pay | Admitting: Physical Therapy

## 2020-08-19 NOTE — Therapy (Signed)
[facility] [address] [place], [place], [address] Phone: [phone]   Fax:  [phone]  Physical Therapy Treatment & Discharge Summary  Patient Details  Name: Donald Todd MRN: [id number] Date of Birth: June 26, 1929 Referring Provider (PT): Dr. [name]   Encounter Date: 08/18/2020   PT End of Session - 08/19/20 1806    Visit Number 4    Number of Visits 5    Date for PT Re-Evaluation 08/28/20    Authorization Type Medicare/Tricare    Authorization Time Period 07-20-20 - 09-19-20    PT Start Time 1315    PT Stop Time 1354    PT Time Calculation (min) 39 min    Equipment Utilized During Treatment Gait belt    Activity Tolerance Patient tolerated treatment well    Behavior During Therapy [facility] for tasks assessed/performed           Past Medical History:  Diagnosis Date  . Arthritis   . CAD (coronary artery disease)    last cath. 07-09-2010  . Cancer [facility])    kidney cancer , prostate cancer   . Chronic kidney disease    lt kidnet removed  . H/O cardiovascular stress test 08-04-2010   ef 55% NO ISCHEMIA  . H/O unilateral nephrectomy    lt. nephrectomy  . Heart attack ([place]) 1993  . History of kidney stones   . Hyperlipemia   . Hypertension    renal dopplers 218.13-normal patency  . Inguinal hernia 08/01/11   right  . Macular degeneration   . Prostate disease    Cancer S/P seed implant  . Vertigo     Past Surgical History:  Procedure Laterality Date  . ANGIOPLASTY  1993  . athroscopic knee surgery  2002   right  . bone scan  2005  . CARDIAC CATHETERIZATION  07-09-2010   normal left ventricular function, coronary obsstructive disease with 20% proximal an 30-40% mid lt. anterior descending narrowing ; widely patent stent in the proximal ramus intermediate vessel; 50-70-% narrowing in the mid circumflex,and widely patent stent  in the rt. coronary   . CARDIAC CATHETERIZATION  12-22-2003    widely patent stents with noncritical coronary artery disease and normalLV function  . CHOLECYSTECTOMY  05/05/2009   Laparoscopic  . colonscopy  2004   with biopsy  . CORONARY ANGIOPLASTY WITH STENT PLACEMENT  03-15-1996   pci to rci and ramus branch using j&j [id number] stent,post dialated  at  20atm (rca)  and 14 atm (ramus branch. ef 55%            . HERNIA REPAIR  08/01/11   RIH  . INGUINAL HERNIA REPAIR Left 03/29/2013   Procedure: HERNIA REPAIR INGUINAL ADULT;  Surgeon: Edward Jolly, MD;  [facility];  Service: General;  Laterality: Left;  . INSERTION OF MESH Left 03/29/2013   Procedure: INSERTION OF MESH;  Surgeon: Edward Jolly, MD;  [facility];  Service: General;  Laterality: Left;  . JOINT REPLACEMENT  2006 and 2010   left 2006, right 2010  . KIDNEY SURGERY  2002 and 2005   2002 - partial removal of left. 2005 complete removal  . PROSTATE BIOPSY    . prostate seed implant    . radiation treatment  2002   prostate  . REVERSE SHOULDER ARTHROPLASTY Left 05/26/2017   Procedure: REVERSE LEFT TOTAL SHOULDER ARTHROPLASTY;  Surgeon: Netta Cedars, MD;  Location: [facility];  Service: Orthopedics;  Laterality: Left;  .  stent implants  1997    Vitals:   08/18/20 1328 08/18/20 1329 08/18/20 1331  BP: (!) 83/58 132/72 131/71     Subjective Assessment - 08/18/20 1315    Subjective Pt reports he stayed in bed all day Sunday due to re-occurrence of vertigo; states he had it all day - went to bed last night around 10 and woke up at 2:30 am and vertigo was gone; states he has had a headache all morning; states he has a headache just about every day   pt states he took Meclizine prior to today's appt   Patient is accompained by: Family member   [name]   Pertinent History AMD, CAD s/p percutaneous coronary angioplasty, essential HEN, chronic renal impairment stage 3; s/p Lt shoulder replacement    Patient Stated Goals get rid of the headaches and the vertigo    Currently in  Pain? No/denies                     Pt declined positional testing to assess for any residual vertigo - therefore, Dix-Hallpike tests were not done due to pt's request         [facility] Adult PT Treatment/Exercise - 08/19/20 0001      Standardized Balance Assessment   Standardized Balance Assessment Timed Up and Go Test      Timed Up and Go Test   TUG Normal TUG    Normal TUG (seconds) 15.25   with SPC          Reviewed HEP and LTG's and progress - pt states he feels he is ready for D/C       PT Education - 08/19/20 1814    Education Details reviewed HEP; discussed LTG's and progress - pt feels that he is ready for D/C    Person(s) Educated Patient    Methods Explanation    Comprehension Verbalized understanding            PT Short Term Goals - 07/20/20 2004      PT SHORT TERM GOAL #1   Title same as LTG's             PT Long Term Goals - 08/18/20 1326      PT LONG TERM GOAL #1   Title Pt will report at least 25% improvement in vertigo.    Baseline pt reports he had re-occurrence of BPPV on Sunday, 08-16-20 that lasted until Monday am and is now resolved    Time 4    Period Weeks    Status Achieved      PT LONG TERM GOAL #2   Title Improve TUG score from 24.57 secs to </= 20 secs with SPC for reduced fall risk.    Baseline 24.57 secs;  15.25 with SPC    Time 4    Period Weeks    Status Achieved      PT LONG TERM GOAL #3   Title Pt will be independent in HEP for balance and vestibular exercises.    Time 4    Period Weeks    Status Achieved      PT LONG TERM GOAL #4   Title Pt will amb. 28' with head turns without LOB with SBA for incr. safety with environmental scanning.    Time 4    Period Weeks    Status Achieved                 Plan - 08/19/20 1815  Clinical Impression Statement Pt has met all LTG's; pt reports he had not had any vertigo until this past Sunday (08-16-20) which lasted approx. 16 hours, until early Monday  am and is now fully resolved.  Pt feels he is ready for D/C.    Personal Factors and Comorbidities Age;Comorbidity 2    Comorbidities CAD (last cath 2011), macular degeneration, HTN, chronic kidney disease    Examination-Activity Limitations Bend;Transfers;Locomotion Level;Squat    Examination-Participation Restrictions Shop;Meal Prep;Community Activity;Cleaning    Stability/Clinical Decision Making Evolving/Moderate complexity    Rehab Potential Good    PT Frequency 1x / week    PT Duration 4 weeks    PT Treatment/Interventions ADLs/Self Care Home Management;Vestibular;Therapeutic exercise;Balance training;Therapeutic activities;Functional mobility training;Gait training;Stair training    PT Next Visit Plan D/C    PT Home Exercise Plan balance HEP    Consulted and Agree with Plan of Care Patient;Family member/caregiver    Family Member Consulted [name]           Patient will benefit from skilled therapeutic intervention in order to improve the following deficits and impairments:  Difficulty walking, Dizziness, Decreased balance, Impaired vision/preception  Visit Diagnosis: Dizziness and giddiness  Other abnormalities of gait and mobility  Unsteadiness on feet     Problem List Patient Active Problem List   Diagnosis Date Noted  . Vertigo 06/18/2020  . S/P shoulder replacement, left 05/26/2017  . Syncope and collapse 04/03/2017  . Abnormality of gait 09/07/2016  . History of nephrectomy- Lt 06/16/2014  . Chronic renal impairment, stage 3 (moderate) 06/16/2014  . CAD S/P percutaneous coronary angioplasty 05/15/2013  . Essential hypertension 05/15/2013  . Dyslipidemia 05/15/2013    PHYSICAL THERAPY DISCHARGE SUMMARY  Visits from Start of Care: 4 Current functional level related to goals / functional outcomes: See above for progress towards goals - pt reports no vertigo at this time   Remaining deficits: Age-related high level balance deficits   Education /  Equipment: Pt has been instructed in HEP for balance exercises. Plan: Patient agrees to discharge.  Patient goals were met. Patient is being discharged due to meeting the stated rehab goals.  ?????       Alda Lea, PT 08/19/2020, 6:19 PM  [facility] [address] [place], [place], [address] Phone: [phone]   Fax:  [phone]  Name: ARIYAN BRISENDINE MRN: [id number] Date of Birth: Sep 02, 1929

## 2020-08-25 ENCOUNTER — Ambulatory Visit: Payer: Medicare Other | Admitting: Physical Therapy

## 2020-08-27 DIAGNOSIS — Z961 Presence of intraocular lens: Secondary | ICD-10-CM | POA: Diagnosis not present

## 2020-08-27 DIAGNOSIS — H353222 Exudative age-related macular degeneration, left eye, with inactive choroidal neovascularization: Secondary | ICD-10-CM | POA: Diagnosis not present

## 2020-08-27 DIAGNOSIS — H353211 Exudative age-related macular degeneration, right eye, with active choroidal neovascularization: Secondary | ICD-10-CM | POA: Diagnosis not present

## 2020-09-15 DIAGNOSIS — M5411 Radiculopathy, occipito-atlanto-axial region: Secondary | ICD-10-CM | POA: Diagnosis not present

## 2020-09-15 DIAGNOSIS — G44209 Tension-type headache, unspecified, not intractable: Secondary | ICD-10-CM | POA: Diagnosis not present

## 2020-09-15 DIAGNOSIS — M9902 Segmental and somatic dysfunction of thoracic region: Secondary | ICD-10-CM | POA: Diagnosis not present

## 2020-09-15 DIAGNOSIS — M62830 Muscle spasm of back: Secondary | ICD-10-CM | POA: Diagnosis not present

## 2020-09-15 DIAGNOSIS — M9901 Segmental and somatic dysfunction of cervical region: Secondary | ICD-10-CM | POA: Diagnosis not present

## 2020-09-23 DIAGNOSIS — H353222 Exudative age-related macular degeneration, left eye, with inactive choroidal neovascularization: Secondary | ICD-10-CM | POA: Diagnosis not present

## 2020-09-23 DIAGNOSIS — H353211 Exudative age-related macular degeneration, right eye, with active choroidal neovascularization: Secondary | ICD-10-CM | POA: Diagnosis not present

## 2020-09-23 DIAGNOSIS — Z961 Presence of intraocular lens: Secondary | ICD-10-CM | POA: Diagnosis not present

## 2020-09-25 ENCOUNTER — Other Ambulatory Visit: Payer: Self-pay

## 2020-09-25 ENCOUNTER — Encounter: Payer: Self-pay | Admitting: Cardiology

## 2020-09-25 ENCOUNTER — Ambulatory Visit (INDEPENDENT_AMBULATORY_CARE_PROVIDER_SITE_OTHER): Payer: Medicare Other | Admitting: Cardiology

## 2020-09-25 VITALS — BP 144/68 | HR 86 | Ht 67.0 in | Wt 156.0 lb

## 2020-09-25 DIAGNOSIS — E785 Hyperlipidemia, unspecified: Secondary | ICD-10-CM

## 2020-09-25 DIAGNOSIS — Z9861 Coronary angioplasty status: Secondary | ICD-10-CM | POA: Diagnosis not present

## 2020-09-25 DIAGNOSIS — I2510 Atherosclerotic heart disease of native coronary artery without angina pectoris: Secondary | ICD-10-CM | POA: Diagnosis not present

## 2020-09-25 DIAGNOSIS — I10 Essential (primary) hypertension: Secondary | ICD-10-CM | POA: Diagnosis not present

## 2020-09-25 DIAGNOSIS — R269 Unspecified abnormalities of gait and mobility: Secondary | ICD-10-CM

## 2020-09-25 DIAGNOSIS — N1831 Chronic kidney disease, stage 3a: Secondary | ICD-10-CM | POA: Diagnosis not present

## 2020-09-25 NOTE — Progress Notes (Signed)
Cardiology Office Note:    Date:  09/25/2020   ID:  Donald Todd, DOB 06-Dec-1928, MRN [id number]  PCP:  [name], MD  Cardiologist:  No primary care provider on file.  Electrophysiologist:  None   Referring MD: [name], MD   No chief complaint on file.   History of Present Illness:    Donald Todd is a 86 y.o. male with a hx of coronary disease, status post remote RCA and ramus intermedius stenting in 1997.  His last catheterization was in 2011.  He has had a left nephrectomy in the past and has had renal insufficiency, his last serum creatinine in July 2021 was 1.13 with a GFR of 57.  He did have some chest pain in June 2021, a Myoview was done and reviewed by Dr. Alvester Chou who felt this was low risk.  The patient was seen in the emergency room in July 2021 with gait instability.  He has actually been evaluated by Dr. Jannifer Franklin for this in the past as well (2014).  A CT scan and MRI were obtained 06/18/2020.  CT was negative and his MRI showed atrophy and chronic microvascular ischemic change in the white matter and pons.  He has done pretty well since.  He does use a cane.  He has had bilateral knee replacement and his orthopedist has recommended he take SBE prophylaxis.  Past Medical History:  Diagnosis Date  . Arthritis   . CAD (coronary artery disease)    last cath. 07-09-2010  . Cancer [facility])    kidney cancer , prostate cancer   . Chronic kidney disease    lt kidnet removed  . H/O cardiovascular stress test 08-04-2010   ef 55% NO ISCHEMIA  . H/O unilateral nephrectomy    lt. nephrectomy  . Heart attack ([place]) 1993  . History of kidney stones   . Hyperlipemia   . Hypertension    renal dopplers 218.13-normal patency  . Inguinal hernia 08/01/11   right  . Macular degeneration   . Prostate disease    Cancer S/P seed implant  . Vertigo     Past Surgical History:  Procedure Laterality Date  . ANGIOPLASTY  1993  . athroscopic knee surgery  2002   right   . bone scan  2005  . CARDIAC CATHETERIZATION  07-09-2010   normal left ventricular function, coronary obsstructive disease with 20% proximal an 30-40% mid lt. anterior descending narrowing ; widely patent stent in the proximal ramus intermediate vessel; 50-70-% narrowing in the mid circumflex,and widely patent stent  in the rt. coronary   . CARDIAC CATHETERIZATION  12-22-2003   widely patent stents with noncritical coronary artery disease and normalLV function  . CHOLECYSTECTOMY  05/05/2009   Laparoscopic  . colonscopy  2004   with biopsy  . CORONARY ANGIOPLASTY WITH STENT PLACEMENT  03-15-1996   pci to rci and ramus branch using j&j [id number] stent,post dialated  at  20atm (rca)  and 14 atm (ramus branch. ef 55%            . HERNIA REPAIR  08/01/11   RIH  . INGUINAL HERNIA REPAIR Left 03/29/2013   Procedure: HERNIA REPAIR INGUINAL ADULT;  Surgeon: Edward Jolly, MD;  Location: [facility];  Service: General;  Laterality: Left;  . INSERTION OF MESH Left 03/29/2013   Procedure: INSERTION OF MESH;  Surgeon: Edward Jolly, MD;  Location: [facility];  Service: General;  Laterality: Left;  . JOINT REPLACEMENT  2006 and 2010   left 2006, right 2010  . KIDNEY SURGERY  2002 and 2005   2002 - partial removal of left. 2005 complete removal  . PROSTATE BIOPSY    . prostate seed implant    . radiation treatment  2002   prostate  . REVERSE SHOULDER ARTHROPLASTY Left 05/26/2017   Procedure: REVERSE LEFT TOTAL SHOULDER ARTHROPLASTY;  Surgeon: Netta Cedars, MD;  Location: [place];  Service: Orthopedics;  Laterality: Left;  . stent implants  1997    Current Medications: Current Meds  Medication Sig  . amLODipine (NORVASC) 10 MG tablet Take 1 tablet (10 mg total) by mouth daily.  [name] amoxicillin (AMOXIL) 500 MG tablet Take 500 mg by mouth 2 (two) times daily.  [name] aspirin 81 MG tablet Take 81 mg by mouth daily.   [name] atorvastatin (LIPITOR) 40 MG tablet Take 1 tablet (40 mg total) by mouth at bedtime.  .  enalapril (VASOTEC) 10 MG tablet TAKE 1 TABLET DAILY (Patient taking differently: Take 10 mg by mouth daily. )  . hydrOXYzine (ATARAX/VISTARIL) 25 MG tablet Take 25 mg by mouth every evening.  [name] ipratropium (ATROVENT) 0.03 % nasal spray Place 1-2 sprays into both nostrils 2 (two) times daily as needed (allergies).   . loratadine (CLARITIN) 10 MG tablet Take 10 mg by mouth daily.  . meclizine (ANTIVERT) 25 MG tablet Take 1 tablet (25 mg total) by mouth 3 (three) times daily as needed for dizziness.  . metoprolol succinate (TOPROL-XL) 25 MG 24 hr tablet TAKE 1 TABLET DAILY  . Multiple Vitamins-Minerals (PRESERVISION/LUTEIN PO) Take by mouth.  . nitroGLYCERIN (NITROSTAT) 0.4 MG SL tablet Place 1 tablet (0.4 mg total) under the tongue as needed.     Allergies:   Tape, Hydrocodone, Iohexol, and Oxycodone   Social History   Socioeconomic History  . Marital status: Married    Spouse name: Not on file  . Number of children: 5  . Years of education: 87  . Highest education level: Not on file  Occupational History  . Occupation: Retired    Comment: Firefighter, Marine scientist  Tobacco Use  . Smoking status: Former Smoker    Types: Cigarettes, Cigars    Quit date: 12/14/1963    Years since quitting: 56.8  . Smokeless tobacco: Never Used  Vaping Use  . Vaping Use: Never used  Substance and Sexual Activity  . Alcohol use: No  . Drug use: No  . Sexual activity: Not on file  Other Topics Concern  . Not on file  Social History Narrative   Lives at home w/ his wife   Right-handed   Caffeine: 2 cups of coffee per day, rare soft drink   Social Determinants of Health   Financial Resource Strain:   . Difficulty of Paying Living Expenses: Not on file  Food Insecurity:   . Worried About Charity fundraiser in the Last Year: Not on file  . Ran Out of Food in the Last Year: Not on file  Transportation Needs:   . Lack of Transportation (Medical): Not on file  . Lack of Transportation (Non-Medical):  Not on file  Physical Activity:   . Days of Exercise per Week: Not on file  . Minutes of Exercise per Session: Not on file  Stress:   . Feeling of Stress : Not on file  Social Connections:   . Frequency of Communication with Friends and Family: Not on file  . Frequency of Social Gatherings with Friends and  Family: Not on file  . Attends Religious Services: Not on file  . Active Member of Clubs or Organizations: Not on file  . Attends Archivist Meetings: Not on file  . Marital Status: Not on file     Family History: The patient's family history includes Cancer in his mother; Stroke in his brother and father.  ROS:   Please see the history of present illness.    All other systems reviewed and are negative.  EKGs/Labs/Other Studies Reviewed:    The following studies were reviewed today: Myoview June 2021-  The left ventricular ejection fraction is mildly decreased (45-54%).  Nuclear stress EF: 53%.  There was no ST segment deviation noted during stress.  Defect 1: There is a medium defect of moderate severity.  This is a low risk study.  Findings consistent with prior myocardial infarction with peri-infarct ischemia.   Abnormal, low risk stress nuclear study with prior lateral infarct and minimal peri-infarct ischemia.  Gated ejection fraction 53% with hypokinesis of the lateral wall.  EKG:  EKG is not ordered today.  The ekg ordered 06/18/2020 demonstrates NSR, 1st AVB, inferior Qs, poor anterior RW  Recent Labs: 06/19/2020: ALT 34; BUN 22; Creatinine, Ser 1.13; Hemoglobin 13.6; Platelets 177; Potassium 4.3; Sodium 139  Recent Lipid Panel    Component Value Date/Time   CHOL  05/05/2009 0313    124        ATP III CLASSIFICATION:  <200     mg/dL   Desirable  200-239  mg/dL   Borderline High  >=240    mg/dL   High          TRIG 116 05/05/2009 0313   HDL 39 (L) 05/05/2009 0313   CHOLHDL 3.2 05/05/2009 0313   VLDL 23 05/05/2009 0313   LDLCALC  05/05/2009  0313    62        Total Cholesterol/HDL:CHD Risk Coronary Heart Disease Risk Table                     Men   Women  1/2 Average Risk   3.4   3.3  Average Risk       5.0   4.4  2 X Average Risk   9.6   7.1  3 X Average Risk  23.4   11.0        Use the calculated Patient Ratio above and the CHD Risk Table to determine the patient's CHD Risk.        ATP III CLASSIFICATION (LDL):  <100     mg/dL   Optimal  100-129  mg/dL   Near or Above                    Optimal  130-159  mg/dL   Borderline  160-189  mg/dL   High  >190     mg/dL   Very High    Physical Exam:    VS:  BP (!) 144/68   Pulse 86   Ht '5\' 7"'  (1.702 m)   Wt 156 lb (70.8 kg)   SpO2 97%   BMI 24.43 kg/m     Wt Readings from Last 3 Encounters:  09/25/20 156 lb (70.8 kg)  06/26/20 158 lb 6.4 oz (71.8 kg)  05/07/20 165 lb (74.8 kg)     GEN:  Well nourished, well developed in no acute distress HEENT: Normal NECK: No JVD; No carotid bruits CARDIAC: RRR, no murmurs, rubs, gallops RESPIRATORY:  Clear to auscultation without rales, wheezing or rhonchi  ABDOMEN: Soft, non-tender, non-distended MUSCULOSKELETAL:  No edema; No deformity  SKIN: Warm and dry NEUROLOGIC:  Alert and oriented x 3 PSYCHIATRIC:  Normal affect   ASSESSMENT:    Abnormality of gait MRI-CSVD. Cane and PT has helped  CAD S/P percutaneous coronary angioplasty History of CAD- s/p remote RCA and RI PCI in the 90's. Cath in 2011 showed patent stents with 30-40% LAD, 60% CFX with normal LVF Myoview June 2021- reviewed by Dr Gwenlyn Found- low risk  Essential hypertension Controlled  Chronic renal impairment, stage 3 (moderate) History of partial nephrectomy- most recent GFR 57  PLAN:    Same Rx- f/u dr Gwenlyn Found in Jan   Medication Adjustments/Labs and Tests Ordered: Current medicines are reviewed at length with the patient today.  Concerns regarding medicines are outlined above.  No orders of the defined types were placed in this encounter.  No  orders of the defined types were placed in this encounter.   Patient Instructions  Medication Instructions:  Continue current medications  *If you need a refill on your cardiac medications before your next appointment, please call your pharmacy*   Lab Work: None Ordered   Testing/Procedures: None Ordered   Follow-Up: At [facility], you and your health needs are our priority.  As part of our continuing mission to provide you with exceptional heart care, we have created designated Provider Care Teams.  These Care Teams include your primary Cardiologist (physician) and Advanced Practice Providers (APPs -  Physician Assistants and Nurse Practitioners) who all work together to provide you with the care you need, when you need it.  We recommend signing up for the patient portal called "MyChart".  Sign up information is provided on this After Visit Summary.  MyChart is used to connect with patients for Virtual Visits (Telemedicine).  Patients are able to view lab/test results, encounter notes, upcoming appointments, etc.  Non-urgent messages can be sent to your provider as well.   To learn more about what you can do with MyChart, go to [email or web address].    Your next appointment:   3 month(s)  The format for your next appointment:   In Person  Provider:   You may see Hyman Hopes or one of the following Advanced Practice Providers on your designated Care Team:    [name], PA-C  [place], [place]  Coletta Memos, FNP        Signed, [name], [place]  09/25/2020 10:55 AM    [place]

## 2020-09-25 NOTE — Assessment & Plan Note (Signed)
MRI-CSVD. Cane and PT has helped

## 2020-09-25 NOTE — Assessment & Plan Note (Signed)
Controlled.  

## 2020-09-25 NOTE — Assessment & Plan Note (Signed)
History of partial nephrectomy- most recent GFR 57

## 2020-09-25 NOTE — Assessment & Plan Note (Signed)
History of CAD- s/p remote RCA and RI PCI in the 90's. Cath in 2011 showed patent stents with 30-40% LAD, 60% CFX with normal LVF Myoview June 2021- reviewed by Dr Gwenlyn Found- low risk

## 2020-09-25 NOTE — Patient Instructions (Signed)

## 2020-10-16 DIAGNOSIS — E7800 Pure hypercholesterolemia, unspecified: Secondary | ICD-10-CM | POA: Diagnosis not present

## 2020-10-16 DIAGNOSIS — I10 Essential (primary) hypertension: Secondary | ICD-10-CM | POA: Diagnosis not present

## 2020-10-16 DIAGNOSIS — R7301 Impaired fasting glucose: Secondary | ICD-10-CM | POA: Diagnosis not present

## 2020-10-22 DIAGNOSIS — H353222 Exudative age-related macular degeneration, left eye, with inactive choroidal neovascularization: Secondary | ICD-10-CM | POA: Diagnosis not present

## 2020-10-22 DIAGNOSIS — H353211 Exudative age-related macular degeneration, right eye, with active choroidal neovascularization: Secondary | ICD-10-CM | POA: Diagnosis not present

## 2020-10-22 DIAGNOSIS — Z961 Presence of intraocular lens: Secondary | ICD-10-CM | POA: Diagnosis not present

## 2020-10-27 DIAGNOSIS — Z23 Encounter for immunization: Secondary | ICD-10-CM | POA: Diagnosis not present

## 2020-11-03 DIAGNOSIS — N281 Cyst of kidney, acquired: Secondary | ICD-10-CM | POA: Diagnosis not present

## 2020-11-03 DIAGNOSIS — R8279 Other abnormal findings on microbiological examination of urine: Secondary | ICD-10-CM | POA: Diagnosis not present

## 2020-11-03 DIAGNOSIS — Z8546 Personal history of malignant neoplasm of prostate: Secondary | ICD-10-CM | POA: Diagnosis not present

## 2020-11-03 DIAGNOSIS — Z85528 Personal history of other malignant neoplasm of kidney: Secondary | ICD-10-CM | POA: Diagnosis not present

## 2020-11-10 DIAGNOSIS — N183 Chronic kidney disease, stage 3 unspecified: Secondary | ICD-10-CM | POA: Diagnosis not present

## 2020-11-10 DIAGNOSIS — Z0000 Encounter for general adult medical examination without abnormal findings: Secondary | ICD-10-CM | POA: Diagnosis not present

## 2020-11-10 DIAGNOSIS — I2510 Atherosclerotic heart disease of native coronary artery without angina pectoris: Secondary | ICD-10-CM | POA: Diagnosis not present

## 2020-11-10 DIAGNOSIS — R7301 Impaired fasting glucose: Secondary | ICD-10-CM | POA: Diagnosis not present

## 2020-11-10 DIAGNOSIS — I10 Essential (primary) hypertension: Secondary | ICD-10-CM | POA: Diagnosis not present

## 2020-11-10 DIAGNOSIS — L570 Actinic keratosis: Secondary | ICD-10-CM | POA: Diagnosis not present

## 2020-12-18 ENCOUNTER — Ambulatory Visit: Payer: Medicare Other | Admitting: Cardiovascular Disease

## 2020-12-18 DIAGNOSIS — H353211 Exudative age-related macular degeneration, right eye, with active choroidal neovascularization: Secondary | ICD-10-CM | POA: Diagnosis not present

## 2020-12-18 DIAGNOSIS — Z961 Presence of intraocular lens: Secondary | ICD-10-CM | POA: Diagnosis not present

## 2020-12-18 DIAGNOSIS — H353222 Exudative age-related macular degeneration, left eye, with inactive choroidal neovascularization: Secondary | ICD-10-CM | POA: Diagnosis not present

## 2020-12-30 ENCOUNTER — Other Ambulatory Visit: Payer: Self-pay

## 2020-12-30 ENCOUNTER — Ambulatory Visit (INDEPENDENT_AMBULATORY_CARE_PROVIDER_SITE_OTHER): Payer: Medicare Other | Admitting: Cardiovascular Disease

## 2020-12-30 ENCOUNTER — Encounter: Payer: Self-pay | Admitting: Cardiovascular Disease

## 2020-12-30 ENCOUNTER — Ambulatory Visit: Payer: Medicare Other | Admitting: Cardiovascular Disease

## 2020-12-30 VITALS — BP 130/78 | HR 70 | Ht 66.0 in | Wt 155.6 lb

## 2020-12-30 DIAGNOSIS — I2510 Atherosclerotic heart disease of native coronary artery without angina pectoris: Secondary | ICD-10-CM

## 2020-12-30 DIAGNOSIS — E785 Hyperlipidemia, unspecified: Secondary | ICD-10-CM | POA: Diagnosis not present

## 2020-12-30 DIAGNOSIS — Z9861 Coronary angioplasty status: Secondary | ICD-10-CM

## 2020-12-30 DIAGNOSIS — I10 Essential (primary) hypertension: Secondary | ICD-10-CM

## 2020-12-30 NOTE — Progress Notes (Signed)
12/30/2020 Donald Todd   Sep 21, 1929  [id number]  Primary Physician [name], MD Primary Cardiologist: Lorretta Harp MD FACP, [place], [place], [place]  HPI:  Donald Todd is a 87 y.o.  thin-appearing married Caucasian male, father of 66, grandfather to 7 grandchildren, who I last saw in the office 06/26/2020. He has a history of RCA and ramus branch stenting by myself back in 1997. I catheterized him, December 22, 2003, revealing patent stent with a 60% mid LAD lesion, normal LV function. He did have a 40% ostial right renal artery stenosis with a known left nephrectomy. His last Myoview, performed August 04, 2010, revealed lateral scar, and catheterization performed several weeks prior to that revealed patent stents with noncritical CAD. He has remained asymptomatic. His last .Myoview stress test performed 02/15/13 moderate sized lateral wall scar consistent with his anatomy.He saw [name] in the office 04/03/17 after a syncopal episode that occurred while he was bending over and running up. He's had a workup including an MRI of his brain which is unremarkable. He did injure his left shoulderand had this surgically addressed.  Because of some atypical chest pain I perform Myoview stress testing on him 05/07/2020 that showed lateral scar without ischemia and change from prior Myoview stress test.  His pain is somewhat atypical.  Since I saw him 6 months ago he remained stable.  He still gets occasional atypical chest pain.  I am reassured by the stability of his last Myoview stress test performed 6 months ago.   Current Meds  Medication Sig  . amLODipine (NORVASC) 10 MG tablet Take 1 tablet (10 mg total) by mouth daily.  [name] amoxicillin (AMOXIL) 500 MG tablet Take 500 mg by mouth 2 (two) times daily.  [name] aspirin 81 MG tablet Take 81 mg by mouth daily.  [name] atorvastatin (LIPITOR) 40 MG tablet Take 1 tablet (40 mg total) by mouth at bedtime.  . enalapril (VASOTEC) 10 MG tablet TAKE  1 TABLET DAILY  . ipratropium (ATROVENT) 0.03 % nasal spray Place 1-2 sprays into both nostrils 2 (two) times daily as needed (allergies).   . loratadine (CLARITIN) 10 MG tablet Take 10 mg by mouth daily.  . meclizine (ANTIVERT) 25 MG tablet Take 1 tablet (25 mg total) by mouth 3 (three) times daily as needed for dizziness.  . metoprolol succinate (TOPROL-XL) 25 MG 24 hr tablet TAKE 1 TABLET DAILY  . Multiple Vitamins-Minerals (PRESERVISION/LUTEIN PO) Take by mouth.  . nitroGLYCERIN (NITROSTAT) 0.4 MG SL tablet Place 1 tablet (0.4 mg total) under the tongue as needed.     Allergies  Allergen Reactions  . Tape Hives    Surgical tape - causes blisters  . Hydrocodone   . Iohexol      Desc: PT DOES RECALL REACTION JUST SAYS IT WAS A LONG TIME AGO FOR KIDNEY X-RAYS AND HE DIDN'T TOLERATE IT WELL-ARS 05/02/09-NOTE E-CHART STATES A HOT FEELING IS HIS REACTION, BUT HE CAN'T CONFIRM THAT WAS ALL   . Oxycodone Other (See Comments)    Shaking uncontrollably     Social History   Socioeconomic History  . Marital status: Married    Spouse name: Not on file  . Number of children: 5  . Years of education: 60  . Highest education level: Not on file  Occupational History  . Occupation: Retired    Comment: Firefighter, Marine scientist  Tobacco Use  . Smoking status: Former Smoker    Types: Cigarettes, Cigars  Quit date: 12/14/1963    Years since quitting: 57.0  . Smokeless tobacco: Never Used  Vaping Use  . Vaping Use: Never used  Substance and Sexual Activity  . Alcohol use: No  . Drug use: No  . Sexual activity: Not on file  Other Topics Concern  . Not on file  Social History Narrative   Lives at home w/ his wife   Right-handed   Caffeine: 2 cups of coffee per day, rare soft drink   Social Determinants of Health   Financial Resource Strain: Not on file  Food Insecurity: Not on file  Transportation Needs: Not on file  Physical Activity: Not on file  Stress: Not on file  Social  Connections: Not on file  Intimate Partner Violence: Not on file     Review of Systems: General: negative for chills, fever, night sweats or weight changes.  Cardiovascular: negative for chest pain, dyspnea on exertion, edema, orthopnea, palpitations, paroxysmal nocturnal dyspnea or shortness of breath Dermatological: negative for rash Respiratory: negative for cough or wheezing Urologic: negative for hematuria Abdominal: negative for nausea, vomiting, diarrhea, bright red blood per rectum, melena, or hematemesis Neurologic: negative for visual changes, syncope, or dizziness All other systems reviewed and are otherwise negative except as noted above.    Blood pressure 130/78, pulse 70, height 5\' 6"  (1.676 m), weight 155 lb 9.6 oz (70.6 kg).  General appearance: alert and no distress Neck: no adenopathy, no carotid bruit, no JVD, supple, symmetrical, trachea midline and thyroid not enlarged, symmetric, no tenderness/mass/nodules Lungs: clear to auscultation bilaterally Heart: regular rate and rhythm, S1, S2 normal, no murmur, click, rub or gallop Extremities: extremities normal, atraumatic, no cyanosis or edema Pulses: 2+ and symmetric Skin: Skin color, texture, turgor normal. No rashes or lesions Neurologic: Alert and oriented X 3, normal strength and tone. Normal symmetric reflexes. Normal coordination and gait  EKG sinus rhythm at 70 with incomplete right bundle branch block and left anterior fascicular block.  I personally reviewed this EKG.  He did have septal Q waves as well.  ASSESSMENT AND PLAN:   Essential hypertension History of essential hypertension a blood pressure measured today 130/78.  He is on amlodipine, enalapril and metoprolol.  CAD S/P percutaneous coronary angioplasty History of CAD status post RCA a ramus branch stenting by myself back in 1997.  I catheterized him 12/22/2003 revealing patent stents with a 60% mid LAD lesion and normal LV function.  His last  Myoview performed 05/07/2020 because of atypical chest pain showed lateral scar with peri-infarct ischemia unchanged from his prior Myoview.  He gets occasional atypical noncardiac chest pain.  Dyslipidemia History of dyslipidemia on statin therapy lipid profile performed 10/16/2020 revealing a total cholesterol of 137, LDL 54 and HDL of 65.      Lorretta Harp MD FACP,FACC,FAHA, [facility] 12/30/2020 12:06 PM

## 2020-12-30 NOTE — Assessment & Plan Note (Signed)
History of dyslipidemia on statin therapy lipid profile performed 10/16/2020 revealing a total cholesterol of 137, LDL 54 and HDL of 65.

## 2020-12-30 NOTE — Assessment & Plan Note (Signed)
History of essential hypertension a blood pressure measured today 130/78.  He is on amlodipine, enalapril and metoprolol.

## 2020-12-30 NOTE — Patient Instructions (Signed)
Medication Instructions:  Your physician recommends that you continue on your current medications as directed. Please refer to the Current Medication list given to you today.  *If you need a refill on your cardiac medications before your next appointment, please call your pharmacy*   Follow-Up: At [facility], you and your health needs are our priority.  As part of our continuing mission to provide you with exceptional heart care, we have created designated Provider Care Teams.  These Care Teams include your primary Cardiologist (physician) and Advanced Practice Providers (APPs -  Physician Assistants and Nurse Practitioners) who all work together to provide you with the care you need, when you need it.  We recommend signing up for the patient portal called "MyChart".  Sign up information is provided on this After Visit Summary.  MyChart is used to connect with patients for Virtual Visits (Telemedicine).  Patients are able to view lab/test results, encounter notes, upcoming appointments, etc.  Non-urgent messages can be sent to your provider as well.   To learn more about what you can do with MyChart, go to [email or web address].    Your next appointment:   6 month(s)  The format for your next appointment:   In Person  Provider:   You will see one of the following Advanced Practice Providers on your designated Care Team:    Luke Kilroy, PA-C  Callie Goodrich, PA-C  Jesse Cleaver, FNP  Then, Jonathan Berry, MD will plan to see you again in 12 month(s). 

## 2020-12-30 NOTE — Assessment & Plan Note (Signed)
History of CAD status post RCA a ramus branch stenting by myself back in 1997.  I catheterized him 12/22/2003 revealing patent stents with a 60% mid LAD lesion and normal LV function.  His last Myoview performed 05/07/2020 because of atypical chest pain showed lateral scar with peri-infarct ischemia unchanged from his prior Myoview.  He gets occasional atypical noncardiac chest pain.

## 2020-12-31 DIAGNOSIS — Z961 Presence of intraocular lens: Secondary | ICD-10-CM | POA: Diagnosis not present

## 2020-12-31 DIAGNOSIS — H353222 Exudative age-related macular degeneration, left eye, with inactive choroidal neovascularization: Secondary | ICD-10-CM | POA: Diagnosis not present

## 2020-12-31 DIAGNOSIS — H353211 Exudative age-related macular degeneration, right eye, with active choroidal neovascularization: Secondary | ICD-10-CM | POA: Diagnosis not present

## 2021-01-19 DIAGNOSIS — M9902 Segmental and somatic dysfunction of thoracic region: Secondary | ICD-10-CM | POA: Diagnosis not present

## 2021-01-19 DIAGNOSIS — M5411 Radiculopathy, occipito-atlanto-axial region: Secondary | ICD-10-CM | POA: Diagnosis not present

## 2021-01-19 DIAGNOSIS — M62830 Muscle spasm of back: Secondary | ICD-10-CM | POA: Diagnosis not present

## 2021-01-19 DIAGNOSIS — G43101 Migraine with aura, not intractable, with status migrainosus: Secondary | ICD-10-CM | POA: Diagnosis not present

## 2021-01-19 DIAGNOSIS — M9903 Segmental and somatic dysfunction of lumbar region: Secondary | ICD-10-CM | POA: Diagnosis not present

## 2021-01-19 DIAGNOSIS — M9901 Segmental and somatic dysfunction of cervical region: Secondary | ICD-10-CM | POA: Diagnosis not present

## 2021-02-11 ENCOUNTER — Emergency Department (HOSPITAL_BASED_OUTPATIENT_CLINIC_OR_DEPARTMENT_OTHER): Payer: Medicare Other

## 2021-02-11 ENCOUNTER — Emergency Department (HOSPITAL_BASED_OUTPATIENT_CLINIC_OR_DEPARTMENT_OTHER)
Admission: EM | Admit: 2021-02-11 | Discharge: 2021-02-12 | Disposition: A | Discharge status: Home / Self Care | Payer: Medicare Other | Attending: Emergency Medicine | Admitting: Emergency Medicine

## 2021-02-11 ENCOUNTER — Encounter (HOSPITAL_BASED_OUTPATIENT_CLINIC_OR_DEPARTMENT_OTHER): Payer: Self-pay

## 2021-02-11 ENCOUNTER — Other Ambulatory Visit: Payer: Self-pay

## 2021-02-11 DIAGNOSIS — I10 Essential (primary) hypertension: Secondary | ICD-10-CM | POA: Diagnosis not present

## 2021-02-11 DIAGNOSIS — Z85528 Personal history of other malignant neoplasm of kidney: Secondary | ICD-10-CM | POA: Insufficient documentation

## 2021-02-11 DIAGNOSIS — R112 Nausea with vomiting, unspecified: Secondary | ICD-10-CM | POA: Insufficient documentation

## 2021-02-11 DIAGNOSIS — N289 Disorder of kidney and ureter, unspecified: Secondary | ICD-10-CM | POA: Diagnosis not present

## 2021-02-11 DIAGNOSIS — N2889 Other specified disorders of kidney and ureter: Secondary | ICD-10-CM | POA: Diagnosis not present

## 2021-02-11 DIAGNOSIS — R110 Nausea: Secondary | ICD-10-CM | POA: Diagnosis not present

## 2021-02-11 DIAGNOSIS — I129 Hypertensive chronic kidney disease with stage 1 through stage 4 chronic kidney disease, or unspecified chronic kidney disease: Secondary | ICD-10-CM | POA: Diagnosis not present

## 2021-02-11 DIAGNOSIS — Z7982 Long term (current) use of aspirin: Secondary | ICD-10-CM | POA: Insufficient documentation

## 2021-02-11 DIAGNOSIS — K5790 Diverticulosis of intestine, part unspecified, without perforation or abscess without bleeding: Secondary | ICD-10-CM | POA: Diagnosis not present

## 2021-02-11 DIAGNOSIS — Z96612 Presence of left artificial shoulder joint: Secondary | ICD-10-CM | POA: Diagnosis not present

## 2021-02-11 DIAGNOSIS — Z79899 Other long term (current) drug therapy: Secondary | ICD-10-CM | POA: Diagnosis not present

## 2021-02-11 DIAGNOSIS — R1084 Generalized abdominal pain: Secondary | ICD-10-CM | POA: Diagnosis not present

## 2021-02-11 DIAGNOSIS — Z87891 Personal history of nicotine dependence: Secondary | ICD-10-CM | POA: Insufficient documentation

## 2021-02-11 DIAGNOSIS — N183 Chronic kidney disease, stage 3 unspecified: Secondary | ICD-10-CM | POA: Insufficient documentation

## 2021-02-11 DIAGNOSIS — R197 Diarrhea, unspecified: Secondary | ICD-10-CM | POA: Insufficient documentation

## 2021-02-11 DIAGNOSIS — Z955 Presence of coronary angioplasty implant and graft: Secondary | ICD-10-CM | POA: Insufficient documentation

## 2021-02-11 DIAGNOSIS — Z8546 Personal history of malignant neoplasm of prostate: Secondary | ICD-10-CM | POA: Diagnosis not present

## 2021-02-11 DIAGNOSIS — D1779 Benign lipomatous neoplasm of other sites: Secondary | ICD-10-CM | POA: Diagnosis not present

## 2021-02-11 DIAGNOSIS — I700 Atherosclerosis of aorta: Secondary | ICD-10-CM | POA: Diagnosis not present

## 2021-02-11 DIAGNOSIS — I2510 Atherosclerotic heart disease of native coronary artery without angina pectoris: Secondary | ICD-10-CM | POA: Insufficient documentation

## 2021-02-11 LAB — TROPONIN I (HIGH SENSITIVITY)
Troponin I (High Sensitivity): 8 ng/L (ref <18)
Troponin I (High Sensitivity): 8 ng/L (ref <18)

## 2021-02-11 LAB — COMPREHENSIVE METABOLIC PANEL
ALT: 22 U/L (ref 0–44)
AST: 22 U/L (ref 15–41)
Albumin: 3.8 g/dL (ref 3.5–5.0)
Alkaline Phosphatase: 84 U/L (ref 38–126)
Anion gap: 12 (ref 5–15)
BUN: 26 mg/dL — ABNORMAL HIGH (ref 8–23)
CO2: 20 mmol/L — ABNORMAL LOW (ref 22–32)
Calcium: 8.9 mg/dL (ref 8.9–10.3)
Chloride: 107 mmol/L (ref 98–111)
Creatinine, Ser: 1.3 mg/dL — ABNORMAL HIGH (ref 0.61–1.24)
GFR, Estimated: 53 mL/min — ABNORMAL LOW (ref >60)
Glucose, Bld: 114 mg/dL — ABNORMAL HIGH (ref 70–99)
Potassium: 4.8 mmol/L (ref 3.5–5.1)
Sodium: 139 mmol/L (ref 135–145)
Total Bilirubin: 0.7 mg/dL (ref 0.3–1.2)
Total Protein: 7.2 g/dL (ref 6.5–8.1)

## 2021-02-11 LAB — CBC WITH DIFFERENTIAL/PLATELET
Abs Immature Granulocytes: 0.01 10*3/uL (ref 0.00–0.07)
Basophils Absolute: 0 10*3/uL (ref 0.0–0.1)
Basophils Relative: 1 %
Eosinophils Absolute: 0.3 10*3/uL (ref 0.0–0.5)
Eosinophils Relative: 6 %
HCT: 41.1 % (ref 39.0–52.0)
Hemoglobin: 13.2 g/dL (ref 13.0–17.0)
Immature Granulocytes: 0 %
Lymphocytes Relative: 31 %
Lymphs Abs: 1.7 10*3/uL (ref 0.7–4.0)
MCH: 29.5 pg (ref 26.0–34.0)
MCHC: 32.1 g/dL (ref 30.0–36.0)
MCV: 91.9 fL (ref 80.0–100.0)
Monocytes Absolute: 0.6 10*3/uL (ref 0.1–1.0)
Monocytes Relative: 12 %
Neutro Abs: 2.7 10*3/uL (ref 1.7–7.7)
Neutrophils Relative %: 50 %
Platelets: 181 10*3/uL (ref 150–400)
RBC: 4.47 MIL/uL (ref 4.22–5.81)
RDW: 13.9 % (ref 11.5–15.5)
WBC: 5.4 10*3/uL (ref 4.0–10.5)
nRBC: 0 % (ref 0.0–0.2)

## 2021-02-11 LAB — LIPASE, BLOOD: Lipase: 32 U/L (ref 11–51)

## 2021-02-11 MED ORDER — SODIUM CHLORIDE 0.9 % IV BOLUS
1000.0000 mL | Freq: Once | INTRAVENOUS | Status: AC
  Administered 2021-02-11: 1000 mL via INTRAVENOUS

## 2021-02-11 MED ORDER — ONDANSETRON 4 MG PO TBDP: ORAL_TABLET | ORAL | 0 refills | Status: DC

## 2021-02-11 MED ORDER — ONDANSETRON HCL 4 MG/2ML IJ SOLN
4.0000 mg | Freq: Once | INTRAMUSCULAR | Status: AC
  Administered 2021-02-11: 4 mg via INTRAVENOUS
  Filled 2021-02-11: qty 2

## 2021-02-11 NOTE — Discharge Instructions (Addendum)
You have a recurrent mass right next to where you had the nephrectomy site.  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  Take Zofran for nausea  You need to call [facility] tomorrow for appointment.  Return to ER if you have worse abdominal pain, vomiting, dehydration, fever

## 2021-02-11 NOTE — ED Provider Notes (Signed)
[facility] Provider Note   CSN: [id number] Arrival date & time: 02/11/21  1928     History Chief Complaint  Patient presents with  . Diarrhea    Donald Todd is a 87 y.o. male history of CAD, CKD, hyperlipidemia, hypertension here presenting with dry heaving and diarrhea and nausea.  Patient states that about a week ago he had an episode of diarrhea and some diffuse abdominal cramps.  He also felt nauseated and had several episodes of vomiting.  He has poor appetite for the last week or so.  He states that today he has worsening dry heaving and nausea.  He states that he is not on any nausea medicine at baseline.  He denies any chest pain or shortness of breath.  Denies any sick contacts.  The history is provided by the patient.       Past Medical History:  Diagnosis Date  . Arthritis   . CAD (coronary artery disease)    last cath. 07-09-2010  . Cancer [facility])    kidney cancer , prostate cancer   . Chronic kidney disease    lt kidnet removed  . H/O cardiovascular stress test 08-04-2010   ef 55% NO ISCHEMIA  . H/O unilateral nephrectomy    lt. nephrectomy  . Heart attack ([place]) 1993  . History of kidney stones   . Hyperlipemia   . Hypertension    renal dopplers 218.13-normal patency  . Inguinal hernia 08/01/11   right  . Macular degeneration   . Prostate disease    Cancer S/P seed implant  . Vertigo     Patient Active Problem List   Diagnosis Date Noted  . Vertigo 06/18/2020  . S/P shoulder replacement, left 05/26/2017  . Syncope and collapse 04/03/2017  . Abnormality of gait 09/07/2016  . History of nephrectomy- Lt 06/16/2014  . Chronic renal impairment, stage 3 (moderate) ([place]) 06/16/2014  . CAD S/P percutaneous coronary angioplasty 05/15/2013  . Essential hypertension 05/15/2013  . Dyslipidemia 05/15/2013    Past Surgical History:  Procedure Laterality Date  . ANGIOPLASTY  1993  . athroscopic knee surgery  2002   right  .  bone scan  2005  . CARDIAC CATHETERIZATION  07-09-2010   normal left ventricular function, coronary obsstructive disease with 20% proximal an 30-40% mid lt. anterior descending narrowing ; widely patent stent in the proximal ramus intermediate vessel; 50-70-% narrowing in the mid circumflex,and widely patent stent  in the rt. coronary   . CARDIAC CATHETERIZATION  12-22-2003   widely patent stents with noncritical coronary artery disease and normalLV function  . CHOLECYSTECTOMY  05/05/2009   Laparoscopic  . colonscopy  2004   with biopsy  . CORONARY ANGIOPLASTY WITH STENT PLACEMENT  03-15-1996   pci to rci and ramus branch using j&j [id number] stent,post dialated  at  20atm (rca)  and 14 atm (ramus branch. ef 55%            . HERNIA REPAIR  08/01/11   RIH  . INGUINAL HERNIA REPAIR Left 03/29/2013   Procedure: HERNIA REPAIR INGUINAL ADULT;  Surgeon: Edward Jolly, MD;  Location: [facility];  Service: General;  Laterality: Left;  . INSERTION OF MESH Left 03/29/2013   Procedure: INSERTION OF MESH;  Surgeon: Edward Jolly, MD;  Location: [facility];  Service: General;  Laterality: Left;  . JOINT REPLACEMENT  2006 and 2010   left 2006, right 2010  . KIDNEY SURGERY  2002 and 2005  2002 - partial removal of left. 2005 complete removal  . PROSTATE BIOPSY    . prostate seed implant    . radiation treatment  2002   prostate  . REVERSE SHOULDER ARTHROPLASTY Left 05/26/2017   Procedure: REVERSE LEFT TOTAL SHOULDER ARTHROPLASTY;  Surgeon: Netta Cedars, MD;  Location: [place];  Service: Orthopedics;  Laterality: Left;  . stent implants  1997       Family History  Problem Relation Age of Onset  . Cancer Mother        stomach?  . Stroke Father   . Stroke Brother     Social History   Tobacco Use  . Smoking status: Former Smoker    Types: Cigarettes, Cigars    Quit date: 12/14/1963    Years since quitting: 57.2  . Smokeless tobacco: Never Used  Vaping Use  . Vaping Use: Never used  Substance Use  Topics  . Alcohol use: No  . Drug use: No    Home Medications Prior to Admission medications   Medication Sig Start Date End Date Taking? Authorizing Provider  amLODipine (NORVASC) 10 MG tablet Take 1 tablet (10 mg total) by mouth daily. 04/30/20   Lorretta Harp, MD  amoxicillin (AMOXIL) 500 MG tablet Take 500 mg by mouth 2 (two) times daily.    [provider]  aspirin 81 MG tablet Take 81 mg by mouth daily.    [provider]  atorvastatin (LIPITOR) 40 MG tablet Take 1 tablet (40 mg total) by mouth at bedtime. 07/22/20   Lorretta Harp, MD  enalapril (VASOTEC) 10 MG tablet TAKE 1 TABLET DAILY 04/03/20   Lorretta Harp, MD  ipratropium (ATROVENT) 0.03 % nasal spray Place 1-2 sprays into both nostrils 2 (two) times daily as needed (allergies).  05/29/20   [provider]  loratadine (CLARITIN) 10 MG tablet Take 10 mg by mouth daily.    [provider]  meclizine (ANTIVERT) 25 MG tablet Take 1 tablet (25 mg total) by mouth 3 (three) times daily as needed for dizziness. 06/19/20   Guilford Shi, MD  metoprolol succinate (TOPROL-XL) 25 MG 24 hr tablet TAKE 1 TABLET DAILY 07/22/20   Lorretta Harp, MD  Multiple Vitamins-Minerals (PRESERVISION/LUTEIN PO) Take by mouth.    [provider]  nitroGLYCERIN (NITROSTAT) 0.4 MG SL tablet Place 1 tablet (0.4 mg total) under the tongue as needed. 04/30/19   Lorretta Harp, MD    Allergies    Tape, Hydrocodone, Iohexol, and Oxycodone  Review of Systems   Review of Systems  Gastrointestinal: Positive for diarrhea, nausea and vomiting.  All other systems reviewed and are negative.   Physical Exam Updated Vital Signs BP (!) 150/71   Pulse 65   Temp 97.9 F (36.6 C) (Oral)   Resp 19   Ht '5\' 7"'  (1.702 m)   Wt 67.6 kg   SpO2 96%   BMI 23.34 kg/m   Physical Exam Vitals and nursing note reviewed.  Constitutional:      Comments: Chronically ill, dehydrated  HENT:     Head:  Normocephalic.     Nose: Nose normal.     Mouth/Throat:     Mouth: Mucous membranes are dry.  Eyes:     Extraocular Movements: Extraocular movements intact.     Pupils: Pupils are equal, round, and reactive to light.  Cardiovascular:     Rate and Rhythm: Normal rate and regular rhythm.     Pulses: Normal pulses.  Heart sounds: Normal heart sounds.  Pulmonary:     Effort: Pulmonary effort is normal.     Breath sounds: Normal breath sounds.  Abdominal:     General: Abdomen is flat.     Palpations: Abdomen is soft.     Comments: Mild epigastric tenderness  Musculoskeletal:        General: Normal range of motion.     Cervical back: Normal range of motion and neck supple.  Skin:    General: Skin is warm.     Capillary Refill: Capillary refill takes less than 2 seconds.  Neurological:     General: No focal deficit present.     Mental Status: He is oriented to person, place, and time.  Psychiatric:        Mood and Affect: Mood normal.        Behavior: Behavior normal.     ED Results / Procedures / Treatments   Labs (all labs ordered are listed, but only abnormal results are displayed) Labs Reviewed  COMPREHENSIVE METABOLIC PANEL - Abnormal; Notable for the following components:      Result Value   CO2 20 (*)    Glucose, Bld 114 (*)    BUN 26 (*)    Creatinine, Ser 1.30 (*)    GFR, Estimated 53 (*)    All other components within normal limits  CBC WITH DIFFERENTIAL/PLATELET  LIPASE, BLOOD  TROPONIN I (HIGH SENSITIVITY)  TROPONIN I (HIGH SENSITIVITY)    EKG EKG Interpretation  Date/Time:  Thursday February 11 2021 19:49:09 EST Ventricular Rate:  75 PR Interval:    QRS Duration: 104 QT Interval:  390 QTC Calculation: 436 R Axis:   -63 Text Interpretation: Sinus or ectopic atrial rhythm Incomplete RBBB and LAFB Consider anterior infarct Nonspecific T abnormalities, lateral leads No significant change since last tracing Confirmed by Wandra Arthurs [phone]) on 02/11/2021  7:56:53 PM   Radiology CT ABDOMEN PELVIS WO CONTRAST  Result Date: 02/11/2021 CLINICAL DATA:  Abdominal distension EXAM: CT ABDOMEN AND PELVIS WITHOUT CONTRAST TECHNIQUE: Multidetector CT imaging of the abdomen and pelvis was performed following the standard protocol without IV contrast. COMPARISON:  07/24/2012 FINDINGS: LOWER CHEST: Normal. HEPATOBILIARY: Normal hepatic contours. No intra- or extrahepatic biliary dilatation. Status post cholecystectomy. PANCREAS: Normal pancreas. No ductal dilatation or peripancreatic fluid collection. SPLEEN: Normal. ADRENALS/URINARY TRACT: Right adrenal myelolipoma. Left adrenal gland not clearly visualized. Left kidney is absent. Near the left renal fossa there is a soft tissue mass abutting the upper psoas muscle that measures 4.3 x 2.8 cm (2:29), new from prior. The urinary bladder is normal for degree of distention STOMACH/BOWEL: There is no hiatal hernia. Normal duodenal course and caliber. No small bowel dilatation or inflammation. Rectosigmoid diverticulosis without acute inflammation. The appendix is not visualized. No right lower quadrant inflammation or free fluid. VASCULAR/LYMPHATIC: There is calcific atherosclerosis of the abdominal aorta. No lymphadenopathy. REPRODUCTIVE: Metallic seeds in the prostate. MUSCULOSKELETAL. No bony spinal canal stenosis or focal osseous abnormality. OTHER: None. IMPRESSION: 1. Remote left nephrectomy with new soft tissue mass abutting the upper left psoas muscle, near the left renal fossa, measuring up to 4.3 x 2.8 cm. This is concerning for recurrent or metastatic disease. 2. Rectosigmoid diverticulosis without acute inflammation. Aortic Atherosclerosis (ICD10-I70.0). Electronically Signed   By: Ulyses Jarred M.D.   On: 02/11/2021 21:15    Procedures Procedures   Medications Ordered in ED Medications  sodium chloride 0.9 % bolus 1,000 mL ( Intravenous Stopped 02/11/21 2102)  ondansetron (  ZOFRAN) injection 4 mg (4 mg  Intravenous Given 02/11/21 2021)    ED Course  I have reviewed the triage vital signs and the nursing notes.  Pertinent labs & imaging results that were available during my care of the patient were reviewed by me and considered in my medical decision making (see chart for details).    MDM Rules/Calculators/A&P                         Donald Todd is a 87 y.o. male who presented with diarrhea and dry heaving and nausea.  Likely viral gastroenteritis.  Also consider ACS.  I doubt PE or dissection.  Plan to get CBC, CMP, lipase, troponin x2, CT abdomen pelvis.  Will hydrate and reassess.  11:29 PM CT showed a mass next to the left nephrectomy site.  This is concerning for possible metastatic disease.  He states that his previous surgery was done with [facility]. [facility] to [facility] for [facility] for the mass.   Final Clinical Impression(s) / ED Diagnoses Final diagnoses:  None    Rx / DC Orders ED Discharge Orders    None       Drenda Freeze, MD 02/11/21 2330

## 2021-02-11 NOTE — ED Triage Notes (Signed)
Pt c/o diarrhea, nausea, "dry heaves" x 2 days-NAD-steady gait with own cane

## 2021-02-15 DIAGNOSIS — C642 Malignant neoplasm of left kidney, except renal pelvis: Secondary | ICD-10-CM | POA: Diagnosis not present

## 2021-02-19 ENCOUNTER — Other Ambulatory Visit (HOSPITAL_COMMUNITY): Payer: Self-pay | Admitting: Urology

## 2021-02-19 DIAGNOSIS — Z85528 Personal history of other malignant neoplasm of kidney: Secondary | ICD-10-CM

## 2021-02-22 ENCOUNTER — Other Ambulatory Visit (HOSPITAL_COMMUNITY): Payer: Self-pay | Admitting: Urology

## 2021-02-22 ENCOUNTER — Encounter (HOSPITAL_COMMUNITY): Payer: Self-pay

## 2021-02-22 DIAGNOSIS — Z85528 Personal history of other malignant neoplasm of kidney: Secondary | ICD-10-CM

## 2021-02-22 NOTE — Progress Notes (Signed)
Donald Todd Male, 87 y.o., [birth year]  MRN:  [id number] Phone:  [phone] (H)       PCP:  [name], MD Primary Cvg:  Medicare/Medicare Part A And B  Next Appt With Radiology ([facility]-CT 3) 03/02/2021 at 11:00 AM           RE: Biopsy Received: 3 days ago  Message Details  Arne Cleveland, MD  Lenore Cordia Ok   CT core L psoas mass   [facility]    Previous Messages  ----- Message -----  From: Lenore Cordia  Sent: 02/19/2021  4:17 PM EDT  To: Ir Procedure Requests  Subject: Biopsy                      Procedure Requested: US Biopsy    Reason for Procedure: biopsy of left psoas lesion with features concerning for renal cell carcinoma recurrence    Provider Requesting: Dr. Lina Sar  Provider Telephone: [phone]    Other Info:

## 2021-02-23 ENCOUNTER — Telehealth (HOSPITAL_COMMUNITY): Payer: Self-pay

## 2021-02-23 NOTE — Telephone Encounter (Signed)
Called to move biopsy to 3/30, no answer, left vm. AW

## 2021-02-26 DIAGNOSIS — H353211 Exudative age-related macular degeneration, right eye, with active choroidal neovascularization: Secondary | ICD-10-CM | POA: Diagnosis not present

## 2021-02-26 DIAGNOSIS — H353221 Exudative age-related macular degeneration, left eye, with active choroidal neovascularization: Secondary | ICD-10-CM | POA: Diagnosis not present

## 2021-02-26 DIAGNOSIS — Z961 Presence of intraocular lens: Secondary | ICD-10-CM | POA: Diagnosis not present

## 2021-02-26 DIAGNOSIS — H353222 Exudative age-related macular degeneration, left eye, with inactive choroidal neovascularization: Secondary | ICD-10-CM | POA: Diagnosis not present

## 2021-03-01 ENCOUNTER — Other Ambulatory Visit: Payer: Self-pay | Admitting: Radiology

## 2021-03-02 ENCOUNTER — Other Ambulatory Visit: Payer: Self-pay | Admitting: Radiology

## 2021-03-02 ENCOUNTER — Ambulatory Visit (HOSPITAL_COMMUNITY): Payer: Medicare Other

## 2021-03-03 ENCOUNTER — Other Ambulatory Visit: Payer: Self-pay

## 2021-03-03 ENCOUNTER — Other Ambulatory Visit: Payer: Self-pay | Admitting: Student

## 2021-03-03 ENCOUNTER — Ambulatory Visit (HOSPITAL_COMMUNITY)
Admission: RE | Admit: 2021-03-03 | Discharge: 2021-03-03 | Disposition: A | Discharge status: Home / Self Care | Payer: Medicare Other | Source: Ambulatory Visit | Attending: Urology | Admitting: Urology

## 2021-03-03 ENCOUNTER — Other Ambulatory Visit (HOSPITAL_COMMUNITY): Payer: Self-pay | Admitting: Student

## 2021-03-03 ENCOUNTER — Encounter (HOSPITAL_COMMUNITY): Payer: Self-pay

## 2021-03-03 DIAGNOSIS — M799 Soft tissue disorder, unspecified: Secondary | ICD-10-CM | POA: Diagnosis not present

## 2021-03-03 DIAGNOSIS — I700 Atherosclerosis of aorta: Secondary | ICD-10-CM | POA: Insufficient documentation

## 2021-03-03 DIAGNOSIS — C649 Malignant neoplasm of unspecified kidney, except renal pelvis: Secondary | ICD-10-CM | POA: Diagnosis not present

## 2021-03-03 DIAGNOSIS — Z85528 Personal history of other malignant neoplasm of kidney: Secondary | ICD-10-CM | POA: Diagnosis not present

## 2021-03-03 DIAGNOSIS — Z8546 Personal history of malignant neoplasm of prostate: Secondary | ICD-10-CM | POA: Diagnosis not present

## 2021-03-03 LAB — CBC
HCT: 39.7 % (ref 39.0–52.0)
Hemoglobin: 12.6 g/dL — ABNORMAL LOW (ref 13.0–17.0)
MCH: 29.8 pg (ref 26.0–34.0)
MCHC: 31.7 g/dL (ref 30.0–36.0)
MCV: 93.9 fL (ref 80.0–100.0)
Platelets: 174 10*3/uL (ref 150–400)
RBC: 4.23 MIL/uL (ref 4.22–5.81)
RDW: 13.8 % (ref 11.5–15.5)
WBC: 8.1 10*3/uL (ref 4.0–10.5)
nRBC: 0 % (ref 0.0–0.2)

## 2021-03-03 LAB — PROTIME-INR
INR: 1 (ref 0.8–1.2)
Prothrombin Time: 13 seconds (ref 11.4–15.2)

## 2021-03-03 MED ORDER — FENTANYL CITRATE (PF) 100 MCG/2ML IJ SOLN
INTRAMUSCULAR | Status: AC | PRN
  Administered 2021-03-03: 25 ug via INTRAVENOUS

## 2021-03-03 MED ORDER — OXYCODONE-ACETAMINOPHEN 5-325 MG PO TABS: 1.0000 | ORAL_TABLET | Freq: Four times a day (QID) | ORAL | 0 refills | Status: DC | PRN

## 2021-03-03 MED ORDER — OXYCODONE-ACETAMINOPHEN 7.5-325 MG PO TABS: 1.0000 | ORAL_TABLET | ORAL | 0 refills | Status: DC | PRN

## 2021-03-03 MED ORDER — SODIUM CHLORIDE 0.9 % IV SOLN: INTRAVENOUS | Status: DC

## 2021-03-03 MED ORDER — GELATIN ABSORBABLE 12-7 MM EX MISC
CUTANEOUS | Status: AC
  Filled 2021-03-03: qty 1

## 2021-03-03 MED ORDER — FENTANYL CITRATE (PF) 100 MCG/2ML IJ SOLN
INTRAMUSCULAR | Status: AC
  Filled 2021-03-03: qty 2

## 2021-03-03 MED ORDER — MIDAZOLAM HCL 2 MG/2ML IJ SOLN
INTRAMUSCULAR | Status: AC
  Filled 2021-03-03: qty 2

## 2021-03-03 MED ORDER — LIDOCAINE-EPINEPHRINE 1 %-1:100000 IJ SOLN
INTRAMUSCULAR | Status: AC
  Filled 2021-03-03: qty 1

## 2021-03-03 MED ORDER — MIDAZOLAM HCL 2 MG/2ML IJ SOLN
INTRAMUSCULAR | Status: AC | PRN
  Administered 2021-03-03: 1 mg via INTRAVENOUS

## 2021-03-03 MED ORDER — OXYCODONE-ACETAMINOPHEN 5-325 MG PO TABS
ORAL_TABLET | ORAL | Status: AC
  Administered 2021-03-03: 1 via ORAL
  Filled 2021-03-03: qty 1

## 2021-03-03 MED ORDER — OXYCODONE-ACETAMINOPHEN 5-325 MG PO TABS: 1.0000 | ORAL_TABLET | ORAL | Status: DC | PRN

## 2021-03-03 NOTE — Progress Notes (Signed)
Patient was reevaluated at 1430 hrs.   States the pain is much better now.  He also states that he has had mild pain in the left groin area even before the procedure today, was evaluated by a surgeon and told that he has a left inguinal hernia.   Puncture site, left flank, and left groin area all soft, no tenderness to palpation, no hematoma.  Vitals stable.   Discussed findings with Dr. Pascal Lux, patient clear to be discharged home with PRN pain medicine.   Patient was advised to seek medical advice if the pain worsens or does not respond to the pain medicine, develop bruise on left flank/groin area, dizziness, or heart palpitation.  Patient verbalized understanding.   Armando Gang Maylene Crocker PA-C 03/03/2021 3:35 PM

## 2021-03-03 NOTE — Sedation Documentation (Signed)
Bx site assessed. Clean, dry and intact. Soft to palpation. No drainage noted.

## 2021-03-03 NOTE — Progress Notes (Addendum)
Time was 1310 Pt put on call light, on arrival to room pt was siting bedside. He stated the pain in his LLQ was 10/10. He said he was having difficulty laying in bed but now can't get into bed due to pain in abdomen to lift his left leg.  Dr Pascal Lux was paged/ call returned, pain med ordered, continue to monitor and a PA will be here to evaluate.

## 2021-03-03 NOTE — Procedures (Signed)
Pre procedural Dx: Left sided RP mass Post procedural Dx: Same  Technically successful CT guided biopsy of indeterminate mass within the left psoas musculature.    EBL: Trace.  Complications: Transient asymptomatic bradycardia which resolved without interventioin.   Ronny Bacon, MD Pager #: [phone]

## 2021-03-03 NOTE — Progress Notes (Addendum)
Mr. Malick Netz is a 87 year old male s/p left left iliopsoas musculature mass biopsy with Dr. Pascal Lux today.   This PA and Dr. Pascal Lux were called to patient's bedside around 1330 hrs due to patient complaints of back pain and left groin pain.   Upon evaluation, patient laying in bed comfortably with his wife at the bedside.  States that his back started hurting little bit and his left legs is twitching.  No pain at the puncture site.   Dr. Pascal Lux states that the symptoms that the patient is experiencing are most likely due to bleeding from the muscle that was biopsied today.  However, there is also a possibility that patient might have developed retroperitoneal bleeding which will warrant patient to be admitted.   Patient is not having signs and symptoms of acute bleeding at the moment (tachcardia, hypotension, dizziness.)  Patient was given options for treatment, discharged to home today with pain medicine vs admission to hospital.  Patient and his wife are agreeable with both plans.    Patient to be reevaluated in an hour to determine the treatment plan.  Will reevaluate patient at 1430 hrs.    Armando Gang Harsha Yusko PA-C 03/03/2021 2:16 PM

## 2021-03-03 NOTE — Sedation Documentation (Signed)
Patient HR dropped into 30s with notable ST changes. Patient is alert and oriented, BP stable at this time. Patient complains of arm tingling from lying prone for procedure

## 2021-03-03 NOTE — H&P (Signed)
Chief Complaint: Patient was seen in consultation today for left psoas mass biopsy at the request of Winter,Christopher Marjory Lies  Referring Physician(s): Winter,Christopher Marjory Lies  Supervising Physician: Ruthann Cancer  Patient Status: [facility] - Out-pt  History of Present Illness: Donald Todd is a 87 y.o. male   H Renal cell carcinoma Partial left nephrectomy 2006-- full left nephrectomy 2010 Known prostate Ca 2002  Pt was seen at ED 02/11/21 for N/V/D Work up included CT  IMPRESSION: 1. Remote left nephrectomy with new soft tissue mass abutting the upper left psoas muscle, near the left renal fossa, measuring up to 4.3 x 2.8 cm. This is concerning for recurrent or metastatic disease. 2. Rectosigmoid diverticulosis without acute inflammation.  Referred to Dr Lovena Neighbours Now scheduled for biopsy of left psoas mass   Past Medical History:  Diagnosis Date  . Arthritis   . CAD (coronary artery disease)    last cath. 07-09-2010  . Cancer [facility])    kidney cancer , prostate cancer   . Chronic kidney disease    lt kidnet removed  . H/O cardiovascular stress test 08-04-2010   ef 55% NO ISCHEMIA  . H/O unilateral nephrectomy    lt. nephrectomy  . Heart attack ([place]) 1993  . History of kidney stones   . Hyperlipemia   . Hypertension    renal dopplers 218.13-normal patency  . Inguinal hernia 08/01/11   right  . Macular degeneration   . Prostate disease    Cancer S/P seed implant  . Vertigo     Past Surgical History:  Procedure Laterality Date  . ANGIOPLASTY  1993  . athroscopic knee surgery  2002   right  . bone scan  2005  . CARDIAC CATHETERIZATION  07-09-2010   normal left ventricular function, coronary obsstructive disease with 20% proximal an 30-40% mid lt. anterior descending narrowing ; widely patent stent in the proximal ramus intermediate vessel; 50-70-% narrowing in the mid circumflex,and widely patent stent  in the rt. coronary   . CARDIAC CATHETERIZATION   12-22-2003   widely patent stents with noncritical coronary artery disease and normalLV function  . CHOLECYSTECTOMY  05/05/2009   Laparoscopic  . colonscopy  2004   with biopsy  . CORONARY ANGIOPLASTY WITH STENT PLACEMENT  03-15-1996   pci to rci and ramus branch using j&j [id number] stent,post dialated  at  20atm (rca)  and 14 atm (ramus branch. ef 55%            . HERNIA REPAIR  08/01/11   RIH  . INGUINAL HERNIA REPAIR Left 03/29/2013   Procedure: HERNIA REPAIR INGUINAL ADULT;  Surgeon: Edward Jolly, MD;  Location: [facility] ORS;  Service: General;  Laterality: Left;  . INSERTION OF MESH Left 03/29/2013   Procedure: INSERTION OF MESH;  Surgeon: Edward Jolly, MD;  Location: [facility] OR[facility];  Service: General;  Laterality: Left;  . JOINT REPLACEMENT  2006 and 2010   left 2006, right 2010  . KIDNEY SURGERY  2002 and 2005   2002 - partial removal of left. 2005 complete removal  . PROSTATE BIOPSY    . prostate seed implant    . radiation treatment  2002   prostate  . REVERSE SHOULDER ARTHROPLASTY Left 05/26/2017   Procedure: REVERSE LEFT TOTAL SHOULDER ARTHROPLASTY;  Surgeon: Netta Cedars, MD;  Location: [facility];  Service: Orthopedics;  Laterality: Left;  . stent implants  1997    Allergies: Tape, Hydrocodone, Iohexol, and Oxycodone  Medications: Prior to Admission medications  Medication Sig Start Date End Date Taking? Authorizing Provider  acetaminophen (TYLENOL) 500 MG tablet Take 1,000 mg by mouth every 6 (six) hours as needed for moderate pain.   Yes [provider]  amLODipine (NORVASC) 10 MG tablet Take 1 tablet (10 mg total) by mouth daily. 04/30/20  Yes Lorretta Harp, MD  aspirin 81 MG tablet Take 81 mg by mouth at bedtime.   Yes [provider]  atorvastatin (LIPITOR) 40 MG tablet Take 1 tablet (40 mg total) by mouth at bedtime. 07/22/20  Yes Lorretta Harp, MD  calcium carbonate (TUMS - DOSED IN MG ELEMENTAL CALCIUM) 500 MG chewable tablet Chew 1,000 mg by mouth  daily as needed for indigestion or heartburn.   Yes [provider]  enalapril (VASOTEC) 10 MG tablet TAKE 1 TABLET DAILY Patient taking differently: Take 10 mg by mouth at bedtime. 04/03/20  Yes Lorretta Harp, MD  metoprolol succinate (TOPROL-XL) 25 MG 24 hr tablet TAKE 1 TABLET DAILY Patient taking differently: Take 25 mg by mouth daily. 07/22/20  Yes Lorretta Harp, MD  ofloxacin (OCUFLOX) 0.3 % ophthalmic solution Place 1 drop into the right eye See admin instructions. Instill 1 drop into the right eye 4 times daily 3 days before and 3 days after eye injections   Yes [provider]  ondansetron (ZOFRAN) 4 MG tablet Take 4 mg by mouth every 4 (four) hours as needed for nausea/vomiting. 02/12/21  Yes [provider]  amoxicillin (AMOXIL) 500 MG tablet Take 2,000 mg by mouth See admin instructions. Take 2000 mg 1 hour prior to dental work    [provider]  ipratropium (ATROVENT) 0.03 % nasal spray Place 1-2 sprays into both nostrils 2 (two) times daily as needed (allergies).  05/29/20   [provider]  Multiple Vitamins-Minerals (PRESERVISION AREDS 2) CAPS Take 1 capsule by mouth 2 (two) times daily.    [provider]  nitroGLYCERIN (NITROSTAT) 0.4 MG SL tablet Place 1 tablet (0.4 mg total) under the tongue as needed. Patient taking differently: Place 0.4 mg under the tongue every 5 (five) minutes as needed for chest pain. 04/30/19   Lorretta Harp, MD  ondansetron (ZOFRAN ODT) 4 MG disintegrating tablet 28m ODT q4 hours prn nausea/vomit Patient not taking: Reported on 02/26/2021 02/11/21   YDrenda Freeze MD     Family History  Problem Relation Age of Onset  . Cancer Mother        stomach?  . Stroke Father   . Stroke Brother     Social History   Socioeconomic History  . Marital status: Married    Spouse name: Not on file  . Number of children: 5  . Years of education: 130 . Highest education level: Not on file   Occupational History  . 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 Smoking status: Former Smoker    Types: Cigarettes, Cigars    Quit date: 12/14/1963    Years since quitting: 57.2  . Smokeless tobacco: Never Used  Vaping Use  . Vaping Use: Never used  Substance and Sexual Activity  . Alcohol use: No  . Drug use: No  . Sexual activity: Not on file  Other Topics Concern  . Not on file  Social History Narrative   Lives at home w/ his wife   Right-handed   Caffeine: 2 cups of coffee per day, rare soft drink   Social Determinants of Health  Financial Resource Strain: Not on file  Food Insecurity: Not on file  Transportation Needs: Not on file  Physical Activity: Not on file  Stress: Not on file  Social Connections: Not on file    Review of Systems: A 12 point ROS discussed and pertinent positives are indicated in the HPI above.  All other systems are negative.  Review of Systems  Constitutional: Negative for activity change, fatigue, fever and unexpected weight change.  Respiratory: Negative for cough and shortness of breath.   Cardiovascular: Negative for chest pain.  Gastrointestinal: Negative for abdominal pain and nausea.  Musculoskeletal: Negative for back pain.  Psychiatric/Behavioral: Negative for behavioral problems and confusion.    Vital Signs: BP (!) 156/79   Pulse 79   Temp 97.7 F (36.5 C) (Oral)   Ht _0  (1.702 m)   Wt 148 lb 5.9 oz (67.3 kg)   SpO2 100%   BMI 23.24 kg/m   Physical Exam Vitals reviewed.  HENT:     Mouth/Throat:     Mouth: Mucous membranes are moist.  Cardiovascular:     Rate and Rhythm: Normal rate and regular rhythm.     Heart sounds: Normal heart sounds.  Pulmonary:     Effort: Pulmonary effort is normal.     Breath sounds: Normal breath sounds.  Abdominal:     Palpations: Abdomen is soft.     Tenderness: There is no abdominal tenderness.  Musculoskeletal:        General: Normal range of  motion.  Skin:    General: Skin is warm.  Neurological:     Mental Status: He is oriented to person, place, and time.  Psychiatric:        Behavior: Behavior normal.     Imaging: CT ABDOMEN PELVIS WO CONTRAST  Result Date: 02/11/2021 CLINICAL DATA:  Abdominal distension EXAM: CT ABDOMEN AND PELVIS WITHOUT CONTRAST TECHNIQUE: Multidetector CT imaging of the abdomen and pelvis was performed following the standard protocol without IV contrast. COMPARISON:  07/24/2012 FINDINGS: LOWER CHEST: Normal. HEPATOBILIARY: Normal hepatic contours. No intra- or extrahepatic biliary dilatation. Status post cholecystectomy. PANCREAS: Normal pancreas. No ductal dilatation or peripancreatic fluid collection. SPLEEN: Normal. ADRENALS/URINARY TRACT: Right adrenal myelolipoma. Left adrenal gland not clearly visualized. Left kidney is absent. Near the left renal fossa there is a soft tissue mass abutting the upper psoas muscle that measures 4.3 x 2.8 cm (2:29), new from prior. The urinary bladder is normal for degree of distention STOMACH/BOWEL: There is no hiatal hernia. Normal duodenal course and caliber. No small bowel dilatation or inflammation. Rectosigmoid diverticulosis without acute inflammation. The appendix is not visualized. No right lower quadrant inflammation or free fluid. VASCULAR/LYMPHATIC: There is calcific atherosclerosis of the abdominal aorta. No lymphadenopathy. REPRODUCTIVE: Metallic seeds in the prostate. MUSCULOSKELETAL. No bony spinal canal stenosis or focal osseous abnormality. OTHER: None. IMPRESSION: 1. Remote left nephrectomy with new soft tissue mass abutting the upper left psoas muscle, near the left renal fossa, measuring up to 4.3 x 2.8 cm. This is concerning for recurrent or metastatic disease. 2. Rectosigmoid diverticulosis without acute inflammation. Aortic Atherosclerosis (ICD10-I70.0). Electronically Signed   By: Ulyses Jarred M.D.   On: 02/11/2021 21:15    Labs:  CBC: Recent Labs     06/18/20 0755 06/19/20 0341 02/11/21 1928  WBC 6.5 8.2 5.4  HGB 13.2 13.6 13.2  HCT 40.4 42.0 41.1  PLT 151 177 181    COAGS: No results for input(s): INR, APTT in the last 8760 hours.  BMP: Recent Labs    06/18/20 0755 06/19/20 0341 02/11/21 1928  NA 141 139 139  K 4.6 4.3 4.8  CL 105 104 107  CO2 27 26 20*  GLUCOSE 113* 111* 114*  BUN 21 22 26*  CALCIUM 9.1 9.4 8.9  CREATININE 1.15 1.13 1.30*  GFRNONAA 56* 57* 53*  GFRAA >60 >60  --     LIVER FUNCTION TESTS: Recent Labs    06/19/20 0341 02/11/21 1928  BILITOT 0.8 0.7  AST 25 22  ALT 34 22  ALKPHOS 87 84  PROT 6.8 7.2  ALBUMIN 3.8 3.8    TUMOR MARKERS: No results for input(s): AFPTM, CEA, CA199, CHROMGRNA in the last 8760 hours.  Assessment and Plan:  Hx Prostate Cancer Hx Renal cell cancer--- left nephrectomy Was seen in ED for N/V/D 02/11/21-- imaging revealing left psaos mass  Scheduled now for biopsy of same Risks and benefits of left psoas mass biopsy was discussed with the patient and/or patient's family including, but not limited to bleeding, infection, damage to adjacent structures or low yield requiring additional tests.  All of the questions were answered and there is agreement to proceed. Consent signed and in chart.   Thank you for this interesting consult.  I greatly enjoyed meeting Donald Todd and look forward to participating in their care.  A copy of this report was sent to the requesting provider on this date.  Electronically Signed: Lavonia Drafts, PA-C 03/03/2021, 9:46 AM   I spent a total of  30 Minutes   in face to face in clinical consultation, greater than 50% of which was counseling/coordinating care for left psoas mass biopsy

## 2021-03-03 NOTE — Discharge Instructions (Signed)
Muscle Biopsy, Care After This sheet gives you information about how to care for yourself after your procedure. Your health care provider may also give you more specific instructions. If you have problems or questions, contact your health care provider. What can I expect after the procedure? After your procedure, it is common to have soreness and tenderness at the site of the biopsy. This may last for a few days. Follow these instructions at home: Biopsy site care  Follow instructions from your health care provider about how to take care of your biopsy site. Make sure you: ? Wash your hands with soap and water before and after you change your bandage (dressing). If soap and water are not available, use hand sanitizer. ? Change your dressing as told by your health care provider. ? Leave stitches (sutures), skin glue, or adhesive strips in place. These skin closures may need to stay in place for 2 weeks or longer. If adhesive strip edges start to loosen and curl up, you may trim the loose edges. Do not remove adhesive strips completely unless your health care provider tells you to do that.  Check your biopsy site every day for signs of infection. Check for: ? Redness, swelling, or pain. ? Fluid or blood. ? Warmth. ? Pus or a bad smell.   Activity  Do not drive for 24 hours if you were given a sedative during your procedure.  Return to your normal activities as told by your health care provider. Ask your health care provider what activities are safe for you. General instructions  Take over-the-counter and prescription medicines only as told by your health care provider.  Do not take baths, swim, or use a hot tub until your health care provider approves. Ask your health care provider if you may take showers. You may only be allowed to take sponge baths.  Keep all follow-up visits as told by your health care provider. This is important. Contact a health care provider if:  You have redness,  swelling, or pain around your biopsy site.  You have fluid or blood coming from your biopsy site.  Your biopsy site feels warm to the touch.  You have pus or a bad smell coming from your biopsy site.  You have a fever.  You are light-headed or you feel faint. Get help right away if you:  Develop a rash.  Have trouble breathing.  Have numbness or tingling going down the arm or leg of your biopsy site. Summary  After your procedure, it is common to have soreness and tenderness at the site of the biopsy.  Wash your hands before and after you change your dressing. Leave stitches, skin glue, and adhesive strips in place.  Return to your normal activities as told by your health care provider.  Contact a health care provider if you have redness, swelling, or pain around your biopsy site.  Get help right away if you develop a rash, have trouble breathing, or have numbness and tingling down your arm or leg. This information is not intended to replace advice given to you by your health care provider. Make sure you discuss any questions you have with your health care provider. Document Revised: 10/16/2018 Document Reviewed: 10/16/2018 Elsevier Patient Education  2021 [employer].

## 2021-03-04 LAB — SURGICAL PATHOLOGY

## 2021-03-09 ENCOUNTER — Emergency Department (HOSPITAL_BASED_OUTPATIENT_CLINIC_OR_DEPARTMENT_OTHER): Payer: Medicare Other

## 2021-03-09 ENCOUNTER — Other Ambulatory Visit: Payer: Self-pay

## 2021-03-09 ENCOUNTER — Encounter (HOSPITAL_BASED_OUTPATIENT_CLINIC_OR_DEPARTMENT_OTHER): Payer: Self-pay | Admitting: Emergency Medicine

## 2021-03-09 ENCOUNTER — Emergency Department (HOSPITAL_BASED_OUTPATIENT_CLINIC_OR_DEPARTMENT_OTHER)
Admission: EM | Admit: 2021-03-09 | Discharge: 2021-03-10 | Disposition: A | Discharge status: Home / Self Care | Payer: Medicare Other | Attending: Emergency Medicine | Admitting: Emergency Medicine

## 2021-03-09 DIAGNOSIS — Z96612 Presence of left artificial shoulder joint: Secondary | ICD-10-CM | POA: Insufficient documentation

## 2021-03-09 DIAGNOSIS — Z79899 Other long term (current) drug therapy: Secondary | ICD-10-CM | POA: Diagnosis not present

## 2021-03-09 DIAGNOSIS — I2510 Atherosclerotic heart disease of native coronary artery without angina pectoris: Secondary | ICD-10-CM | POA: Insufficient documentation

## 2021-03-09 DIAGNOSIS — Z85528 Personal history of other malignant neoplasm of kidney: Secondary | ICD-10-CM | POA: Insufficient documentation

## 2021-03-09 DIAGNOSIS — R1084 Generalized abdominal pain: Secondary | ICD-10-CM | POA: Diagnosis not present

## 2021-03-09 DIAGNOSIS — I129 Hypertensive chronic kidney disease with stage 1 through stage 4 chronic kidney disease, or unspecified chronic kidney disease: Secondary | ICD-10-CM | POA: Diagnosis not present

## 2021-03-09 DIAGNOSIS — Z87891 Personal history of nicotine dependence: Secondary | ICD-10-CM | POA: Diagnosis not present

## 2021-03-09 DIAGNOSIS — Z7982 Long term (current) use of aspirin: Secondary | ICD-10-CM | POA: Insufficient documentation

## 2021-03-09 DIAGNOSIS — Z955 Presence of coronary angioplasty implant and graft: Secondary | ICD-10-CM | POA: Diagnosis not present

## 2021-03-09 DIAGNOSIS — N183 Chronic kidney disease, stage 3 unspecified: Secondary | ICD-10-CM | POA: Diagnosis not present

## 2021-03-09 DIAGNOSIS — K5900 Constipation, unspecified: Secondary | ICD-10-CM | POA: Diagnosis not present

## 2021-03-09 DIAGNOSIS — R109 Unspecified abdominal pain: Secondary | ICD-10-CM | POA: Insufficient documentation

## 2021-03-09 DIAGNOSIS — Z8546 Personal history of malignant neoplasm of prostate: Secondary | ICD-10-CM | POA: Insufficient documentation

## 2021-03-09 HISTORY — DX: Other specified disorders of kidney and ureter: N28.89

## 2021-03-09 LAB — URINALYSIS, ROUTINE W REFLEX MICROSCOPIC
Bilirubin Urine: NEGATIVE
Glucose, UA: NEGATIVE mg/dL
Hgb urine dipstick: NEGATIVE
Ketones, ur: 15 mg/dL — AB
Leukocytes,Ua: NEGATIVE
Nitrite: NEGATIVE
Protein, ur: NEGATIVE mg/dL
Specific Gravity, Urine: 1.02 (ref 1.005–1.030)
pH: 6.5 (ref 5.0–8.0)

## 2021-03-09 MED ORDER — POLYETHYLENE GLYCOL 3350 17 G PO PACK: 17.0000 g | PACK | Freq: Every day | ORAL | 2 refills | Status: DC

## 2021-03-09 MED ORDER — DOCUSATE SODIUM 100 MG PO CAPS: 100.0000 mg | ORAL_CAPSULE | Freq: Two times a day (BID) | ORAL | 0 refills | Status: DC

## 2021-03-09 MED ORDER — GLYCERIN (ADULT) 2 G RE SUPP: 1.0000 | RECTAL | 0 refills | Status: DC | PRN

## 2021-03-09 NOTE — ED Triage Notes (Signed)
Pt.Complaint of Lower abdominal pain. Pt dx with abd CA last week. No BM in 3 days. Pain started 3 hours ago. Pt took 1/2 an oxy and later took the other half. No pain while lying still. Pain increases to an 8/10 with movement (walking, bending, twisting) and radiates around to his back.

## 2021-03-09 NOTE — ED Provider Notes (Signed)
[place] EMERGENCY DEPARTMENT Provider Note   CSN: [id number] Arrival date & time: 03/09/21  2009     History Chief Complaint  Patient presents with  . Abdominal Pain    Donald Todd is a 87 y.o. male.  HPI Patient ports he had a biopsy for possible cancer 3 days ago.  He reports that he started getting some pain in his left lateral abdomen yesterday.  It was a sharp pain periodically.  He reports is coming and going.  Sometimes is worse with movement.  He reports he has not had a bowel movement for 3 days.  Patient reports sometimes he does have to manually do some pulling of stool from his rectum when having a bowel movement.  He has been taking an occasional half of an oxycodone for pain.  He reports he is urinating without difficulty.  No pain no burning.  No vomiting.  Patient has not tried any laxatives, stool softeners or other remedies for constipation at home.    Past Medical History:  Diagnosis Date  . Arthritis   . CAD (coronary artery disease)    last cath. 07-09-2010  . Cancer [facility])    kidney cancer , prostate cancer   . Chronic kidney disease    lt kidnet removed  . H/O cardiovascular stress test 08-04-2010   ef 55% NO ISCHEMIA  . H/O unilateral nephrectomy    lt. nephrectomy  . Heart attack ([place]) 1993  . History of kidney stones   . Hyperlipemia   . Hypertension    renal dopplers 218.13-normal patency  . Inguinal hernia 08/01/11   right  . Macular degeneration   . Prostate disease    Cancer S/P seed implant  . Renal mass   . Vertigo     Patient Active Problem List   Diagnosis Date Noted  . Vertigo 06/18/2020  . S/P shoulder replacement, left 05/26/2017  . Syncope and collapse 04/03/2017  . Abnormality of gait 09/07/2016  . History of nephrectomy- Lt 06/16/2014  . Chronic renal impairment, stage 3 (moderate) ([place]) 06/16/2014  . CAD S/P percutaneous coronary angioplasty 05/15/2013  . Essential hypertension 05/15/2013  . Dyslipidemia  05/15/2013    Past Surgical History:  Procedure Laterality Date  . ANGIOPLASTY  1993  . athroscopic knee surgery  2002   right  . bone scan  2005  . CARDIAC CATHETERIZATION  07-09-2010   normal left ventricular function, coronary obsstructive disease with 20% proximal an 30-40% mid lt. anterior descending narrowing ; widely patent stent in the proximal ramus intermediate vessel; 50-70-% narrowing in the mid circumflex,and widely patent stent  in the rt. coronary   . CARDIAC CATHETERIZATION  12-22-2003   widely patent stents with noncritical coronary artery disease and normalLV function  . CHOLECYSTECTOMY  05/05/2009   Laparoscopic  . colonscopy  2004   with biopsy  . CORONARY ANGIOPLASTY WITH STENT PLACEMENT  03-15-1996   pci to rci and ramus branch using j&j [id number] stent,post dialated  at  20atm (rca)  and 14 atm (ramus branch. ef 55%            . HERNIA REPAIR  08/01/11   RIH  . INGUINAL HERNIA REPAIR Left 03/29/2013   Procedure: HERNIA REPAIR INGUINAL ADULT;  Surgeon: Edward Jolly, MD;  Location: [facility];  Service: General;  Laterality: Left;  . INSERTION OF MESH Left 03/29/2013   Procedure: INSERTION OF MESH;  Surgeon: Edward Jolly, MD;  Location: [facility];  Service: General;  Laterality: Left;  . JOINT REPLACEMENT  2006 and 2010   left 2006, right 2010  . KIDNEY SURGERY  2002 and 2005   2002 - partial removal of left. 2005 complete removal  . PROSTATE BIOPSY    . prostate seed implant    . radiation treatment  2002   prostate  . REVERSE SHOULDER ARTHROPLASTY Left 05/26/2017   Procedure: REVERSE LEFT TOTAL SHOULDER ARTHROPLASTY;  Surgeon: Netta Cedars, MD;  Location: [facility];  Service: Orthopedics;  Laterality: Left;  . stent implants  1997       Family History  Problem Relation Age of Onset  . Cancer Mother        stomach?  . Stroke Father   . Stroke Brother     Social History   Tobacco Use  . Smoking status: Former Smoker    Types: Cigarettes, Cigars    Quit  date: 12/14/1963    Years since quitting: 57.2  . Smokeless tobacco: Never Used  Vaping Use  . Vaping Use: Never used  Substance Use Topics  . Alcohol use: No  . Drug use: No    Home Medications Prior to Admission medications   Medication Sig Start Date End Date Taking? Authorizing Provider  acetaminophen (TYLENOL) 500 MG tablet Take 1,000 mg by mouth every 6 (six) hours as needed for moderate pain.    [provider]  amLODipine (NORVASC) 10 MG tablet Take 1 tablet (10 mg total) by mouth daily. 04/30/20   Lorretta Harp, MD  amoxicillin (AMOXIL) 500 MG tablet Take 2,000 mg by mouth See admin instructions. Take 2000 mg 1 hour prior to dental work    [provider]  aspirin 81 MG tablet Take 81 mg by mouth at bedtime.    [provider]  atorvastatin (LIPITOR) 40 MG tablet Take 1 tablet (40 mg total) by mouth at bedtime. 07/22/20   Lorretta Harp, MD  calcium carbonate (TUMS - DOSED IN MG ELEMENTAL CALCIUM) 500 MG chewable tablet Chew 1,000 mg by mouth daily as needed for indigestion or heartburn.    [provider]  enalapril (VASOTEC) 10 MG tablet TAKE 1 TABLET DAILY Patient taking differently: Take 10 mg by mouth at bedtime. 04/03/20   Lorretta Harp, MD  ipratropium (ATROVENT) 0.03 % nasal spray Place 1-2 sprays into both nostrils 2 (two) times daily as needed (allergies).  05/29/20   [provider]  metoprolol succinate (TOPROL-XL) 25 MG 24 hr tablet TAKE 1 TABLET DAILY Patient taking differently: Take 25 mg by mouth daily. 07/22/20   Lorretta Harp, MD  Multiple Vitamins-Minerals (PRESERVISION AREDS 2) CAPS Take 1 capsule by mouth 2 (two) times daily.    [provider]  nitroGLYCERIN (NITROSTAT) 0.4 MG SL tablet Place 1 tablet (0.4 mg total) under the tongue as needed. Patient taking differently: Place 0.4 mg under the tongue every 5 (five) minutes as needed for chest pain. 04/30/19   Lorretta Harp, MD  ofloxacin  (OCUFLOX) 0.3 % ophthalmic solution Place 1 drop into the right eye See admin instructions. Instill 1 drop into the right eye 4 times daily 3 days before and 3 days after eye injections    [provider]  ondansetron (ZOFRAN ODT) 4 MG disintegrating tablet 103m ODT q4 hours prn nausea/vomit Patient not taking: Reported on 02/26/2021 02/11/21   YDrenda Freeze MD  ondansetron (ZOFRAN) 4 MG tablet Take 4 mg by mouth every 4 (four) hours as  needed for nausea/vomiting. 02/12/21   [provider]  oxyCODONE-acetaminophen (PERCOCET) 5-325 MG tablet Take 1 tablet by mouth every 6 (six) hours as needed for moderate pain or severe pain. 03/03/21   Louk, Bea Graff, PA-C    Allergies    Tape, Hydrocodone, Iohexol, and Oxycodone  Review of Systems   Review of Systems 10 systems reviewed negative except as per HPI Physical Exam Updated Vital Signs BP 140/67   Pulse (!) 58   Temp 98.5 F (36.9 C)   Resp 14   Ht _0  (1.702 m)   Wt 67.3 kg   SpO2 99%   BMI 23.24 kg/m   Physical Exam Constitutional:      Comments: Alert and nontoxic.  No respiratory distress.  Mental status clear  HENT:     Mouth/Throat:     Mouth: Mucous membranes are moist.     Pharynx: Oropharynx is clear.  Eyes:     Extraocular Movements: Extraocular movements intact.  Cardiovascular:     Rate and Rhythm: Normal rate and regular rhythm.  Pulmonary:     Effort: Pulmonary effort is normal.     Breath sounds: Normal breath sounds.  Abdominal:     Comments: Abdomen is soft and nondistended.  No guarding.  No reproducible tenderness.  He has a biopsy site on the left flank.  There is just a pinpoint area where the biopsy was marked out.  There is no ecchymosis or erythema.  No swelling.  Genitourinary:    Comments: Penis normal.  No mass or fullness in the inguinal canals.  Scrotum not enlarged.  Rectal exam: There is firm stool high in the rectal vault.  This is mobile.  Brown in  appearance. Musculoskeletal:        General: Normal range of motion.  Skin:    General: Skin is warm and dry.  Neurological:     General: No focal deficit present.     Mental Status: He is oriented to person, place, and time.     Coordination: Coordination normal.  Psychiatric:        Mood and Affect: Mood normal.     ED Results / Procedures / Treatments   Labs (all labs ordered are listed, but only abnormal results are displayed) Labs Reviewed  URINALYSIS, ROUTINE W REFLEX MICROSCOPIC    EKG None  Radiology DG Abdomen Acute W/Chest  Result Date: 03/09/2021 CLINICAL DATA:  Constipation EXAM: DG ABDOMEN ACUTE WITH 1 VIEW CHEST COMPARISON:  02/10/2008 FINDINGS: Moderate stool burden throughout the colon. Radiation seeds in the region of the prostate. Prior cholecystectomy. Nonobstructive bowel gas pattern. No organomegaly or free air. No confluent airspace opacities. Heart is normal size. Aortic atherosclerosis. Prior left shoulder replacement. IMPRESSION: No evidence of bowel obstruction or free air. Moderate stool burden throughout the colon. No acute cardiopulmonary disease. Electronically Signed   By: Rolm Baptise M.D.   On: 03/09/2021 23:27    Procedures Procedures   Medications Ordered in ED Medications - No data to display  ED Course  I have reviewed the triage vital signs and the nursing notes.  Pertinent labs & imaging results that were available during my care of the patient were reviewed by me and considered in my medical decision making (see chart for details).    MDM Rules/Calculators/A&P                          Patient presents as outlined.  His  abdominal exam is soft and nontender.  I have very low suspicion for postbiopsy hemorrhage or intra-abdominal infection.  There are no ecchymoses and no distention of the abdomen.  The biopsy site is on the left flank.  The site is almost imperceptible there is no mass or fullness in the inguinal canals.  Patient does  have formed stool in the rectal vault but is not impacted.  Plain film x-rays do show stool throughout the colon.  No evidence of obstruction.  Patient has not tried any modalities for constipation at home.  We will have him start Colace twice daily, MiraLAX daily until normal bowel movement.  Will recommend close follow-up with PCP for recheck Final Clinical Impression(s) / ED Diagnoses Final diagnoses:  Constipation, unspecified constipation type    Rx / DC Orders ED Discharge Orders    None       Charlesetta Shanks, MD 03/09/21 [id number]

## 2021-03-09 NOTE — Discharge Instructions (Signed)
1.  Start taking Colace twice a day every day.  This will be your daily medication to prevent significant constipation. 2.  Take MiraLAX every day until you are having regular, soft bowel movements.  You may then take MiraLAX only if you have not had a normal bowel movement within 2 to 3 days.  At that time start taking it daily again.  If you feel that you need to have a bowel movement use a glycerin suppository about 20 minutes before you think you will have a bowel movement.  This will help pass stool from the rectum. 3.  Follow-up with your family doctor for recheck within the next 3 to 5 days

## 2021-03-10 ENCOUNTER — Telehealth: Payer: Self-pay | Admitting: Oncology

## 2021-03-10 MED ORDER — DOCUSATE SODIUM 100 MG PO CAPS: 100.0000 mg | ORAL_CAPSULE | Freq: Two times a day (BID) | ORAL | 0 refills | Status: AC

## 2021-03-10 MED ORDER — GLYCERIN (ADULT) 2 G RE SUPP: 1.0000 | RECTAL | 0 refills | Status: DC | PRN

## 2021-03-10 MED ORDER — POLYETHYLENE GLYCOL 3350 17 G PO PACK: 17.0000 g | PACK | Freq: Every day | ORAL | 2 refills | Status: DC

## 2021-03-10 NOTE — Telephone Encounter (Signed)
Received a new pt referral from Dr. Lovena Neighbours at [facility] for recurrent renal cell carcinoma. Mr. Donald Todd has been cld and scheduled to see Dr. Alen Todd on 4/7 at 2pm. Mr. Donald Todd is aware to arrive 20 minutes early.

## 2021-03-11 ENCOUNTER — Other Ambulatory Visit: Payer: Self-pay

## 2021-03-11 ENCOUNTER — Inpatient Hospital Stay: Payer: Medicare Other | Attending: Oncology | Admitting: Oncology

## 2021-03-11 VITALS — BP 151/73 | HR 98 | Temp 97.5°F | Resp 17 | Wt 149.3 lb

## 2021-03-11 DIAGNOSIS — C642 Malignant neoplasm of left kidney, except renal pelvis: Secondary | ICD-10-CM | POA: Diagnosis not present

## 2021-03-11 NOTE — Progress Notes (Signed)
Reason for the request:    Kidney cancer  HPI: I was asked by Dr. Lovena Neighbours to evaluate Donald Todd for diagnosis of kidney cancer.  He is a 87 year old man with history of coronary artery disease, hypertension among other comorbid conditions with a diagnosis of prostate cancer diagnosed in 2002.  That time he had Gleason score 4+3 = 7.  He subsequently was diagnosed with left kidney mass and underwent a partial nephrectomy completed on May 03, 2001.  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  He remained disease-free until recently.  On February 11, 2021 he presented with abdominal distention and underwent CT scan of the abdomen and pelvis which showed a 4.8 x 2.8 cm mass in the upper left psoas muscle.  No evidence of other disease noted at that time.  Percutaneous biopsy obtained on March 03, 2021 showed clear-cell renal cell carcinoma indicating recurrent disease.  Clinically, he continues to have issues with constipation and presented to the emergency department on April 5 for abdominal pain and was diagnosed with constipation.  He has been taking stool softeners and laxative with improvement in his bowel habits.  He does not report any worsening abdominal pain or pelvic pain.  He has been prescribed oxycodone previously which she is no longer taking which is contributed to his constipation.  He still ambulates with the help of a cane without any recent falls or syncope.   He does not report any headaches, blurry vision, syncope or seizures. Does not report any fevers, chills or sweats.  Does not report any cough, wheezing or hemoptysis.  Does not report any chest pain, palpitation, orthopnea or leg edema.  Does not report any nausea, vomiting or abdominal pain.  Does not report any constipation or diarrhea.  Does  not report any skeletal complaints.    Does not report frequency, urgency or hematuria.  Does not report any skin rashes or lesions. Does not report any heat or cold intolerance.  Does not report any lymphadenopathy or petechiae.  Does not report any anxiety or depression.  Remaining review of systems is negative.    Past Medical History:  Diagnosis Date  . Arthritis   . CAD (coronary artery disease)    last cath. 07-09-2010  . Cancer [facility])    kidney cancer , prostate cancer   . Chronic kidney disease    lt kidnet removed  . H/O cardiovascular stress test 08-04-2010   ef 55% NO ISCHEMIA  . H/O unilateral nephrectomy    lt. nephrectomy  . Heart attack ([place]) 1993  . History of kidney stones   . Hyperlipemia   . Hypertension    renal dopplers 218.13-normal patency  . Inguinal hernia 08/01/11   right  . Macular degeneration   . Prostate disease    Cancer S/P seed implant  . Renal mass   . Vertigo   :  Past Surgical History:  Procedure Laterality Date  . ANGIOPLASTY  1993  . athroscopic knee surgery  2002   right  . bone scan  2005  . CARDIAC CATHETERIZATION  07-09-2010   normal left ventricular function, coronary obsstructive disease with 20% proximal an 30-40% mid lt. anterior descending narrowing ; widely patent stent in the proximal ramus intermediate vessel; 50-70-% narrowing in the mid circumflex,and widely patent  stent  in the rt. coronary   . CARDIAC CATHETERIZATION  12-22-2003   widely patent stents with noncritical coronary artery disease and normalLV function  . CHOLECYSTECTOMY  05/05/2009   Laparoscopic  . colonscopy  2004   with biopsy  . CORONARY ANGIOPLASTY WITH STENT PLACEMENT  03-15-1996   pci to rci and ramus branch using j&j [id number] stent,post dialated  at  20atm (rca)  and 14 atm (ramus branch. ef 55%            . HERNIA REPAIR  08/01/11   [facility]  . INGUINAL HERNIA REPAIR Left 03/29/2013   Procedure: HERNIA REPAIR INGUINAL ADULT;  Surgeon: Edward Jolly, MD;   Location: [facility];  Service: General;  Laterality: Left;  . INSERTION OF MESH Left 03/29/2013   Procedure: INSERTION OF MESH;  Surgeon: Edward Jolly, MD;  Location: [facility];  Service: General;  Laterality: Left;  . JOINT REPLACEMENT  2006 and 2010   left 2006, right 2010  . KIDNEY SURGERY  2002 and 2005   2002 - partial removal of left. 2005 complete removal  . PROSTATE BIOPSY    . prostate seed implant    . radiation treatment  2002   prostate  . REVERSE SHOULDER ARTHROPLASTY Left 05/26/2017   Procedure: REVERSE LEFT TOTAL SHOULDER ARTHROPLASTY;  Surgeon: Netta Cedars, MD;  Location: [place];  Service: Orthopedics;  Laterality: Left;  . stent implants  1997  :   Current Outpatient Medications:  .  acetaminophen (TYLENOL) 500 MG tablet, Take 1,000 mg by mouth every 6 (six) hours as needed for moderate pain., Disp: , Rfl:  .  amLODipine (NORVASC) 10 MG tablet, Take 1 tablet (10 mg total) by mouth daily., Disp: 90 tablet, Rfl: 3 .  amoxicillin (AMOXIL) 500 MG tablet, Take 2,000 mg by mouth See admin instructions. Take 2000 mg 1 hour prior to dental work, Disp: , Rfl:  .  aspirin 81 MG tablet, Take 81 mg by mouth at bedtime., Disp: , Rfl:  .  atorvastatin (LIPITOR) 40 MG tablet, Take 1 tablet (40 mg total) by mouth at bedtime., Disp: 90 tablet, Rfl: 2 .  calcium carbonate (TUMS - DOSED IN MG ELEMENTAL CALCIUM) 500 MG chewable tablet, Chew 1,000 mg by mouth daily as needed for indigestion or heartburn., Disp: , Rfl:  .  docusate sodium (COLACE) 100 MG capsule, Take 1 capsule (100 mg total) by mouth every 12 (twelve) hours., Disp: 60 capsule, Rfl: 0 .  enalapril (VASOTEC) 10 MG tablet, TAKE 1 TABLET DAILY (Patient taking differently: Take 10 mg by mouth at bedtime.), Disp: 90 tablet, Rfl: 3 .  glycerin adult 2 g suppository, Place 1 suppository rectally as needed for constipation., Disp: 12 suppository, Rfl: 0 .  ipratropium (ATROVENT) 0.03 % nasal spray, Place 1-2 sprays into both nostrils 2  (two) times daily as needed (allergies). , Disp: , Rfl:  .  metoprolol succinate (TOPROL-XL) 25 MG 24 hr tablet, TAKE 1 TABLET DAILY (Patient taking differently: Take 25 mg by mouth daily.), Disp: 90 tablet, Rfl: 3 .  Multiple Vitamins-Minerals (PRESERVISION AREDS 2) CAPS, Take 1 capsule by mouth 2 (two) times daily., Disp: , Rfl:  .  nitroGLYCERIN (NITROSTAT) 0.4 MG SL tablet, Place 1 tablet (0.4 mg total) under the tongue as needed. (Patient taking differently: Place 0.4 mg under the tongue every 5 (five) minutes as needed for chest pain.), Disp: 25 tablet, Rfl: 3 .  ofloxacin (OCUFLOX) 0.3 % ophthalmic solution, Place 1 drop  into the right eye See admin instructions. Instill 1 drop into the right eye 4 times daily 3 days before and 3 days after eye injections, Disp: , Rfl:  .  ondansetron (ZOFRAN ODT) 4 MG disintegrating tablet, 55m ODT q4 hours prn nausea/vomit (Patient not taking: Reported on 02/26/2021), Disp: 10 tablet, Rfl: 0 .  ondansetron (ZOFRAN) 4 MG tablet, Take 4 mg by mouth every 4 (four) hours as needed for nausea/vomiting., Disp: , Rfl:  .  oxyCODONE-acetaminophen (PERCOCET) 5-325 MG tablet, Take 1 tablet by mouth every 6 (six) hours as needed for moderate pain or severe pain., Disp: 15 tablet, Rfl: 0 .  polyethylene glycol (MIRALAX / GLYCOLAX) 17 g packet, Take 17 g by mouth daily., Disp: 14 each, Rfl: 2:  Allergies  Allergen Reactions  . Tape Hives    Surgical tape - causes blisters  . Hydrocodone     Shaking uncontrollably   . Iohexol      Desc: PT DOES RECALL REACTION JUST SAYS IT WAS A LONG TIME AGO FOR KIDNEY X-RAYS AND HE DIDN'T TOLERATE IT WELL-ARS 05/02/09-NOTE E-CHART STATES A HOT FEELING IS HIS REACTION, BUT HE CAN'T CONFIRM THAT WAS ALL   . Oxycodone Other (See Comments)    Shaking uncontrollably   :  Family History  Problem Relation Age of Onset  . Cancer Mother        stomach?  . Stroke Father   . Stroke Brother   :  Social History   Socioeconomic History   . Marital status: Married    Spouse name: Not on file  . Number of children: 5  . Years of education: 11 . Highest education level: Not on file  Occupational History  . 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 Smoking status: Former Smoker    Types: Cigarettes, Cigars    Quit date: 12/14/1963    Years since quitting: 57.2  . Smokeless tobacco: Never Used  Vaping Use  . Vaping Use: Never used  Substance and Sexual Activity  . Alcohol use: No  . Drug use: No  . Sexual activity: Not on file  Other Topics Concern  . Not on file  Social History Narrative   Lives at home w/ his wife   Right-handed   Caffeine: 2 cups of coffee per day, rare soft drink   Social Determinants of Health   Financial Resource Strain: Not on file  Food Insecurity: Not on file  Transportation Needs: Not on file  Physical Activity: Not on file  Stress: Not on file  Social Connections: Not on file  Intimate Partner Violence: Not on file  :  Pertinent items are noted in HPI.  Exam: Blood pressure (!) 151/73, pulse 98, temperature (!) 97.5 F (36.4 C), temperature source Tympanic, resp. rate 17, weight 149 lb 4.8 oz (67.7 kg), SpO2 98 %.  ECOG 1  General appearance: alert and cooperative appeared without distress. Head: atraumatic without any abnormalities. Eyes: conjunctivae/corneas clear. PERRL.  Sclera anicteric. Throat: lips, mucosa, and tongue normal; without oral thrush or ulcers. Resp: clear to auscultation bilaterally without rhonchi, wheezes or dullness to percussion. Cardio: regular rate and rhythm, S1, S2 normal, no murmur, click, rub or gallop GI: soft, non-tender; bowel sounds normal; no masses,  no organomegaly Skin: Skin color, texture, turgor normal. No rashes or lesions Lymph nodes: Cervical, supraclavicular, and axillary nodes normal. Neurologic: Grossly normal without any motor, sensory or deep tendon reflexes. Musculoskeletal: No joint  deformity  or effusion.    CT ABDOMEN PELVIS WO CONTRAST  Result Date: 02/11/2021 CLINICAL DATA:  Abdominal distension EXAM: CT ABDOMEN AND PELVIS WITHOUT CONTRAST TECHNIQUE: Multidetector CT imaging of the abdomen and pelvis was performed following the standard protocol without IV contrast. COMPARISON:  07/24/2012 FINDINGS: LOWER CHEST: Normal. HEPATOBILIARY: Normal hepatic contours. No intra- or extrahepatic biliary dilatation. Status post cholecystectomy. PANCREAS: Normal pancreas. No ductal dilatation or peripancreatic fluid collection. SPLEEN: Normal. ADRENALS/URINARY TRACT: Right adrenal myelolipoma. Left adrenal gland not clearly visualized. Left kidney is absent. Near the left renal fossa there is a soft tissue mass abutting the upper psoas muscle that measures 4.3 x 2.8 cm (2:29), new from prior. The urinary bladder is normal for degree of distention STOMACH/BOWEL: There is no hiatal hernia. Normal duodenal course and caliber. No small bowel dilatation or inflammation. Rectosigmoid diverticulosis without acute inflammation. The appendix is not visualized. No right lower quadrant inflammation or free fluid. VASCULAR/LYMPHATIC: There is calcific atherosclerosis of the abdominal aorta. No lymphadenopathy. REPRODUCTIVE: Metallic seeds in the prostate. MUSCULOSKELETAL. No bony spinal canal stenosis or focal osseous abnormality. OTHER: None. IMPRESSION: 1. Remote left nephrectomy with new soft tissue mass abutting the upper left psoas muscle, near the left renal fossa, measuring up to 4.3 x 2.8 cm. This is concerning for recurrent or metastatic disease. 2. Rectosigmoid diverticulosis without acute inflammation. Aortic Atherosclerosis (ICD10-I70.0). Electronically Signed   By: Ulyses Jarred M.D.   On: 02/11/2021 21:15   CT BIOPSY  Result Date: 03/03/2021 INDICATION: History of left-sided nephrectomy now with indeterminate soft tissue mass within the left iliopsoas musculature. Please perform CT-guided biopsy for  tissue diagnostic purposes. EXAM: CT-GUIDED BIOPSY INDETERMINATE SOFT TISSUE MASS WITHIN THE LEFT ILIOPSOAS MUSCULATURE COMPARISON:  CT abdomen pelvis-02/11/2021 MEDICATIONS: None. ANESTHESIA/SEDATION: Fentanyl 25 mcg IV; Versed 1 mg IV Sedation time: 14 minutes; The patient was continuously monitored during the procedure by the interventional radiology nurse under my direct supervision. CONTRAST:  None. COMPLICATIONS: SIR Level A - No therapy, no consequence. Procedure complicated by development of transient asymptomatic bradycardia not requiring intervention. Procedure also complicated by development of a small asymptomatic left-sided retroperitoneal hematoma. PROCEDURE: Informed consent was obtained from the patient following an explanation of the procedure, risks, benefits and alternatives. A time out was performed prior to the initiation of the procedure. The patient was positioned prone on the CT table and a limited CT was performed for procedural planning demonstrating unchanged size and appearance of the approximately 3.7 x 2.8 cm isoattenuating nodule/mass within the left iliopsoas musculature (image 18, series 2). The procedure was planned. The operative site was prepped and draped in the usual sterile fashion. Appropriate trajectory was confirmed with a 22 gauge spinal needle after the adjacent tissues were anesthetized with 1% Lidocaine with epinephrine. Under intermittent CT guidance, a 17 gauge coaxial needle was advanced into the peripheral aspect of the mass. Appropriate positioning was confirmed and 5 core needle biopsy samples were obtained with an 18 gauge core needle biopsy device. The co-axial needle was removed following administration of a Gel-Foam slurry and superficial hemostasis was achieved with manual compression. Procedure complicated by development of transient asymptomatic bradycardia as well as a minimal amount of likely expected peri biopsy retroperitoneal hematoma without  intraperitoneal extension. A dressing was applied. The patient otherwise tolerated the procedure well without additional immediate postprocedural complication. IMPRESSION: Technically successful CT guided core needle biopsy of indeterminate soft tissue nodule/mass within the left iliopsoas musculature. Electronically Signed   By: Sandi Mariscal  M.D.   On: 03/03/2021 12:11   DG Abdomen Acute W/Chest  Result Date: 03/09/2021 CLINICAL DATA:  Constipation EXAM: DG ABDOMEN ACUTE WITH 1 VIEW CHEST COMPARISON:  02/10/2008 FINDINGS: Moderate stool burden throughout the colon. Radiation seeds in the region of the prostate. Prior cholecystectomy. Nonobstructive bowel gas pattern. No organomegaly or free air. No confluent airspace opacities. Heart is normal size. Aortic atherosclerosis. Prior left shoulder replacement. IMPRESSION: No evidence of bowel obstruction or free air. Moderate stool burden throughout the colon. No acute cardiopulmonary disease. Electronically Signed   By: Rolm Baptise M.D.   On: 03/09/2021 23:27    Assessment and Plan:   87 year old man with:  1.  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  He presented with 3.0 x 2.8 cm mass of the left iliopsoas muscle.  This was biopsy-proven tissue consistent with renal cell carcinoma.  He does have remote history with left kidney cancer and status post nephrectomy.  Management options at this time were discussed and the natural course of this disease was reviewed.  Ideally, surgical resection would be preferred given the isolated metastatic lesion without any evidence of other systemic disease.  Given his age and comorbidities it does not appear to be a realistic option.  Alternative treatment options at this time were reviewed.  Stereotactic radioablation as an alternative would be reasonable at this time if can be done.  Like to defer the option of systemic therapy given his localized isolated disease.  He would be a marginal  candidate for systemic therapy likely would not offer much palliation compared to local therapy.  After discussion today, we will make the appropriate referral to radiation oncology for an evaluation.  We will also obtain imaging studies of the chest to rule out any metastatic disease.  Upon completing local therapy, we will update his staging scan in the next 6 months.   2.  Constipation: I recommended continuing of the bowel regimen as prescribed.  3.  Follow-up: Will be in the next 3 months for repeat evaluation.  60  minutes were dedicated to this visit. The time was spent on reviewing laboratory data, imaging studies, discussing treatment options, discussing pathology results and answering questions regarding future plan.      A copy of this consult has been forwarded to the requesting physician.

## 2021-03-12 ENCOUNTER — Telehealth: Payer: Self-pay | Admitting: Oncology

## 2021-03-12 NOTE — Telephone Encounter (Signed)
Scheduled per 04/07 los, patient has been called and notified. 

## 2021-03-22 ENCOUNTER — Ambulatory Visit (HOSPITAL_COMMUNITY)
Admission: RE | Admit: 2021-03-22 | Discharge: 2021-03-22 | Disposition: A | Discharge status: Home / Self Care | Payer: Medicare Other | Source: Ambulatory Visit | Attending: Oncology | Admitting: Oncology

## 2021-03-22 ENCOUNTER — Encounter (HOSPITAL_COMMUNITY): Payer: Self-pay

## 2021-03-22 ENCOUNTER — Other Ambulatory Visit: Payer: Self-pay

## 2021-03-22 DIAGNOSIS — C642 Malignant neoplasm of left kidney, except renal pelvis: Secondary | ICD-10-CM | POA: Diagnosis not present

## 2021-03-22 DIAGNOSIS — I2510 Atherosclerotic heart disease of native coronary artery without angina pectoris: Secondary | ICD-10-CM | POA: Diagnosis not present

## 2021-03-22 DIAGNOSIS — Z85528 Personal history of other malignant neoplasm of kidney: Secondary | ICD-10-CM | POA: Diagnosis not present

## 2021-03-22 DIAGNOSIS — D492 Neoplasm of unspecified behavior of bone, soft tissue, and skin: Secondary | ICD-10-CM | POA: Diagnosis not present

## 2021-03-22 DIAGNOSIS — M47814 Spondylosis without myelopathy or radiculopathy, thoracic region: Secondary | ICD-10-CM | POA: Diagnosis not present

## 2021-03-23 DIAGNOSIS — C642 Malignant neoplasm of left kidney, except renal pelvis: Secondary | ICD-10-CM | POA: Insufficient documentation

## 2021-03-23 DIAGNOSIS — C7801 Secondary malignant neoplasm of right lung: Secondary | ICD-10-CM | POA: Insufficient documentation

## 2021-03-23 DIAGNOSIS — C61 Malignant neoplasm of prostate: Secondary | ICD-10-CM | POA: Insufficient documentation

## 2021-03-23 NOTE — Progress Notes (Signed)
[place]         [phone] ________________________________  Initial outpatient Consultation  Name: Donald Todd MRN: [id number]  Date: 03/24/2021  DOB: 02-Aug-1929  REFERRING PHYSICIAN: Wyatt Portela, MD  DIAGNOSIS: 87 year old gentleman with a symptomatic 4.8 cm left psoas metastasis from renal cell carcinoma    ICD-10-CM   1. Cancer of left kidney ([place])  C64.2   2. Malignant neoplasm of prostate ([place])  C61   3. Malignant neoplasm metastatic to right lung [facility])  C78.01     HISTORY OF PRESENT ILLNESS::Donald Todd is a 87 y.o. man with history of prostate cancer diagnosed in April of 2002.  He had Gleason score 4+3 = 7.  During staging CT abdomen/pelvis, he was found to havd a 5-6 cm mass in the left kidney with central necrosis.  He proceeded initially to left partial nephrectomy on 05/03/2001 with Dr. Katrine Coho.  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  Following treatment of the kidney, he proceeded to LDR Prostate Brachytherapy with Dr. Nila Nephew and Gaynelle Arabian using Palladium-103 seeds on 09/27/01 and PSA remained undetectable when last checked.  He has subsequently developed recurrent renal cell carcinoma in 2005 and underwent left nephrectomy with a tumor size of 6.5 cm and Fuhrman grade 3 out of 4 clear-cell renal cell carcinoma.  He remained disease-free until recently.  On February 11, 2021 he presented with abdominal distention and underwent CT scan of the abdomen and pelvis which showed a 4.8 x 2.8 cm mass in the upper left psoas muscle.  No evidence of other disease noted at that time.      Percutaneous CT biopsy obtained on March 03, 2021 showed clear-cell renal cell carcinoma indicating recurrent disease.  Staging Chest CT on 03/22/21 is also suspicious for three pulmonary nodules up to 9 mm.      PREVIOUS RADIATION THERAPY: Yes prostate brachytherapy in 2002 as above  Past Medical History:  Diagnosis Date   . Arthritis   . CAD (coronary artery disease)    last cath. 07-09-2010  . Cancer [facility])    kidney cancer , prostate cancer   . Chronic kidney disease    lt kidnet removed  . H/O cardiovascular stress test 08-04-2010   ef 55% NO ISCHEMIA  . H/O unilateral nephrectomy    lt. nephrectomy  . Heart attack ([place]) 1993  . History of kidney stones   . Hyperlipemia   . Hypertension    renal dopplers 218.13-normal patency  . Inguinal hernia 08/01/11   right  . Macular degeneration   . Prostate disease    Cancer S/P seed implant  . Renal mass   . Vertigo     :   Past Surgical History:  Procedure Laterality Date  . ANGIOPLASTY  1993  . athroscopic knee surgery  2002   right  . bone scan  2005  . CARDIAC CATHETERIZATION  07-09-2010   normal left ventricular function, coronary obsstructive disease with 20% proximal an 30-40% mid lt. anterior descending narrowing ; widely patent stent in the proximal ramus intermediate vessel; 50-70-% narrowing in the mid circumflex,and widely patent stent  in the rt. coronary   . CARDIAC CATHETERIZATION  12-22-2003   widely patent stents with noncritical coronary artery disease and normalLV function  . CHOLECYSTECTOMY  05/05/2009   Laparoscopic  . colonscopy  2004   with biopsy  . CORONARY ANGIOPLASTY WITH STENT PLACEMENT  03-15-1996   pci to rci  and ramus branch using j&j [id number] stent,post dialated  at  20atm (rca)  and 14 atm (ramus branch. ef 55%            . HERNIA REPAIR  08/01/11   RIH  . INGUINAL HERNIA REPAIR Left 03/29/2013   Procedure: HERNIA REPAIR INGUINAL ADULT;  Surgeon: Edward Jolly, MD;  Location: [facility];  Service: General;  Laterality: Left;  . INSERTION OF MESH Left 03/29/2013   Procedure: INSERTION OF MESH;  Surgeon: Edward Jolly, MD;  Location: [facility];  Service: General;  Laterality: Left;  . JOINT REPLACEMENT  2006 and 2010   left 2006, right 2010  . KIDNEY SURGERY  2002 and 2005   2002 - partial removal of left. 2005  complete removal  . PROSTATE BIOPSY    . prostate seed implant    . radiation treatment  2002   prostate  . REVERSE SHOULDER ARTHROPLASTY Left 05/26/2017   Procedure: REVERSE LEFT TOTAL SHOULDER ARTHROPLASTY;  Surgeon: Netta Cedars, MD;  Location: [place];  Service: Orthopedics;  Laterality: Left;  . stent implants  1997:   Current Outpatient Medications:  .  acetaminophen (TYLENOL) 500 MG tablet, Take 1,000 mg by mouth every 6 (six) hours as needed for moderate pain., Disp: , Rfl:  .  amLODipine (NORVASC) 10 MG tablet, Take 1 tablet (10 mg total) by mouth daily., Disp: 90 tablet, Rfl: 3 .  amoxicillin (AMOXIL) 500 MG tablet, Take 2,000 mg by mouth See admin instructions. Take 2000 mg 1 hour prior to dental work, Disp: , Rfl:  .  aspirin 81 MG tablet, Take 81 mg by mouth at bedtime., Disp: , Rfl:  .  atorvastatin (LIPITOR) 40 MG tablet, Take 1 tablet (40 mg total) by mouth at bedtime., Disp: 90 tablet, Rfl: 2 .  calcium carbonate (TUMS - DOSED IN MG ELEMENTAL CALCIUM) 500 MG chewable tablet, Chew 1,000 mg by mouth daily as needed for indigestion or heartburn., Disp: , Rfl:  .  docusate sodium (COLACE) 100 MG capsule, Take 1 capsule (100 mg total) by mouth every 12 (twelve) hours., Disp: 60 capsule, Rfl: 0 .  enalapril (VASOTEC) 10 MG tablet, TAKE 1 TABLET DAILY (Patient taking differently: Take 10 mg by mouth at bedtime.), Disp: 90 tablet, Rfl: 3 .  glycerin adult 2 g suppository, Place 1 suppository rectally as needed for constipation., Disp: 12 suppository, Rfl: 0 .  ipratropium (ATROVENT) 0.03 % nasal spray, Place 1-2 sprays into both nostrils 2 (two) times daily as needed (allergies). , Disp: , Rfl:  .  metoprolol succinate (TOPROL-XL) 25 MG 24 hr tablet, TAKE 1 TABLET DAILY (Patient taking differently: Take 25 mg by mouth daily.), Disp: 90 tablet, Rfl: 3 .  Multiple Vitamins-Minerals (PRESERVISION AREDS 2) CAPS, Take 1 capsule by mouth 2 (two) times daily., Disp: , Rfl:  .  nitroGLYCERIN  (NITROSTAT) 0.4 MG SL tablet, Place 1 tablet (0.4 mg total) under the tongue as needed. (Patient taking differently: Place 0.4 mg under the tongue every 5 (five) minutes as needed for chest pain.), Disp: 25 tablet, Rfl: 3 .  ofloxacin (OCUFLOX) 0.3 % ophthalmic solution, Place 1 drop into the right eye See admin instructions. Instill 1 drop into the right eye 4 times daily 3 days before and 3 days after eye injections, Disp: , Rfl:  .  ondansetron (ZOFRAN ODT) 4 MG disintegrating tablet, 37m ODT q4 hours prn nausea/vomit, Disp: 10 tablet, Rfl: 0 .  ondansetron (ZOFRAN) 4 MG tablet, Take 4  mg by mouth every 4 (four) hours as needed for nausea/vomiting., Disp: , Rfl:  .  oxyCODONE-acetaminophen (PERCOCET) 5-325 MG tablet, Take 1 tablet by mouth every 6 (six) hours as needed for moderate pain or severe pain., Disp: 15 tablet, Rfl: 0 .  polyethylene glycol (MIRALAX / GLYCOLAX) 17 g packet, Take 17 g by mouth daily., Disp: 14 each, Rfl: 2:   Allergies  Allergen Reactions  . Tape Hives    Surgical tape - causes blisters  . Hydrocodone     Shaking uncontrollably   . Iohexol      Desc: PT DOES RECALL REACTION JUST SAYS IT WAS A LONG TIME AGO FOR KIDNEY X-RAYS AND HE DIDN'T TOLERATE IT WELL-ARS 05/02/09-NOTE E-CHART STATES A HOT FEELING IS HIS REACTION, BUT HE CAN'T CONFIRM THAT WAS ALL   . Oxycodone Other (See Comments)    Shaking uncontrollably   :     mily History  Problem Relation Age of Onset  . Cancer Mother        stomach?  . Stroke Father   . Stroke Brother     :   Social History   Socioeconomic History  . Marital status: Married    Spouse name: Not on file  . Number of children: 5  . Years of education: 82  . Highest education level: Not on file  Occupational History  . Occupation: Retired    Comment: Firefighter, Marine scientist  Tobacco Use  . Smoking status: Former Smoker    Types: Cigarettes, Cigars    Quit date: 12/14/1963    Years since quitting: 57.3  . Smokeless  tobacco: Never Used  Vaping Use  . Vaping Use: Never used  Substance and Sexual Activity  . Alcohol use: No  . Drug use: No  . Sexual activity: Not on file  Other Topics Concern  . Not on file  Social History Narrative   Lives at home w/ his wife   Right-handed   Caffeine: 2 cups of coffee per day, rare soft drink   Social Determinants of Health   Financial Resource Strain: Not on file  Food Insecurity: Not on file  Transportation Needs: Not on file  Physical Activity: Not on file  Stress: Not on file  Social Connections: Not on file  Intimate Partner Violence: Not on file  :  REVIEW OF SYSTEMS:  A 15 point review of systems is documented in the electronic medical record. This was obtained by the nursing staff. However, I reviewed this with the patient to discuss relevant findings and make appropriate changes.  Pertinent items are noted in HPI.   PHYSICAL EXAM:There were no vitals taken for this visit.[name] He localizes pain to his left lumbar spine/sacrum    KPS = 80  100 - Normal; no complaints; no evidence of disease. 90   - Able to carry on normal activity; minor signs or symptoms of disease. 80   - Normal activity with effort; some signs or symptoms of disease. 82   - Cares for self; unable to carry on normal activity or to do active work. 60   - Requires occasional assistance, but is able to care for most of his personal needs. 50   - Requires considerable assistance and frequent medical care. 25   - Disabled; requires special care and assistance. 38   - Severely disabled; hospital admission is indicated although death not imminent. 66   - Very sick; hospital admission necessary; active supportive treatment necessary. 10   -  Moribund; fatal processes progressing rapidly. 0     - Dead  Karnofsky DA, Abelmann Hitchcock, Craver LS and Burchenal [facility] [phone]) The use of the nitrogen mustards in the palliative treatment of carcinoma: with particular reference to bronchogenic carcinoma  Cancer 1 634-56  3LARATORY DATA: Lab Results  Component Value Date   WBC 8.1 03/03/2021   HGB 12.6 (L) 03/03/2021   HCT 39.7 03/03/2021   MCV 93.9 03/03/2021   PLT 174 03/03/2021   Lab Results  Component Value Date   NA 139 02/11/2021   K 4.8 02/11/2021   CL 107 02/11/2021   CO2 20 (L) 02/11/2021   Lab Results  Component Value Date   ALT 22 02/11/2021   AST 22 02/11/2021   ALKPHOS 84 02/11/2021   BILITOT 0.7 02/11/2021  22        DICT Chest Wo Contrast  Result Date: 03/22/2021 CLINICAL DATA:  History of left nephrectomy in 2005 for left kidney cancer with biopsy-proven recurrence in the left nephrectomy bed. Prostate cancer in 2002. Chest staging. EXAM: CT CHEST WITHOUT CONTRAST TECHNIQUE: Multidetector CT imaging of the chest was performed following the standard protocol without IV contrast. COMPARISON:  02/11/2021 CT abdomen/pelvis.  10/09/2006 chest CT. FINDINGS: Cardiovascular: Normal heart size. No significant pericardial effusion/thickening. Three-vessel coronary atherosclerosis. Atherosclerotic nonaneurysmal thoracic aorta. Normal caliber pulmonary arteries. Mediastinum/Nodes: Hypodense 1.0 cm right thyroid nodule. Not clinically significant; no follow-up imaging recommended (ref: J Am Coll Radiol. 2015 Feb;12(2): 143-50). Unremarkable esophagus. No pathologically enlarged axillary, mediastinal or hilar lymph nodes, noting limited sensitivity for the detection of hilar adenopathy on this noncontrast study. Lungs/Pleura: No pneumothorax. No pleural effusion. No acute consolidative airspace disease or lung masses. Medial right middle lobe 9 mm solid pulmonary nodule is new since 10/09/2006 chest CT and stable since recent 02/11/2021 CT abdomen study. Posterior right upper lobe 3 mm solid pulmonary nodule (series 5/image 61) and medial left upper lobe 3 mm solid pulmonary nodule (series 5/image 79), both new since 10/09/2006 chest CT. No additional significant pulmonary nodules.  Upper abdomen: Hypodense 1.4 cm posterior right liver lesion (series 2/image 126), unchanged since 02/11/2021 CT abdomen, significantly decreased from 4.4 cm on 05/02/2009 CT, presumably benign. Partially visualized biopsy-proven recurrent tumor in the upper left psoas muscle region (series 2/image 140). Musculoskeletal: No aggressive appearing focal osseous lesions. Left total shoulder arthroplasty. Marked thoracic spondylosis. IMPRESSION: 1. Three scattered solid pulmonary nodules, largest 9 mm in the medial right middle lobe, all new since 2007 chest CT study, suspicious for pulmonary metastases. 2. No thoracic adenopathy. 3. Partially visualized biopsy-proven recurrent tumor in the upper left psoas muscle region. 4. Three-vessel coronary atherosclerosis. 5. Aortic Atherosclerosis (ICD10-I70.0). Electronically Signed   By: Ilona Sorrel M.D.   On: 03/22/2021 12:44   CT BIOPSY  Result Date: 03/03/2021 INDICATION: History of left-sided nephrectomy now with indeterminate soft tissue mass within the left iliopsoas musculature. Please perform CT-guided biopsy for tissue diagnostic purposes. EXAM: CT-GUIDED BIOPSY INDETERMINATE SOFT TISSUE MASS WITHIN THE LEFT ILIOPSOAS MUSCULATURE COMPARISON:  CT abdomen pelvis-02/11/2021 MEDICATIONS: None. ANESTHESIA/SEDATION: Fentanyl 25 mcg IV; Versed 1 mg IV Sedation time: 14 minutes; The patient was continuously monitored during the procedure by the interventional radiology nurse under my direct supervision. CONTRAST:  None. COMPLICATIONS: SIR Level A - No therapy, no consequence. Procedure complicated by development of transient asymptomatic bradycardia not requiring intervention. Procedure also complicated by development of a small asymptomatic left-sided retroperitoneal hematoma. PROCEDURE: Informed consent was obtained from the patient following an explanation  of the procedure, risks, benefits and alternatives. A time out was performed prior to the initiation of the  procedure. The patient was positioned prone on the CT table and a limited CT was performed for procedural planning demonstrating unchanged size and appearance of the approximately 3.7 x 2.8 cm isoattenuating nodule/mass within the left iliopsoas musculature (image 18, series 2). The procedure was planned. The operative site was prepped and draped in the usual sterile fashion. Appropriate trajectory was confirmed with a 22 gauge spinal needle after the adjacent tissues were anesthetized with 1% Lidocaine with epinephrine. Under intermittent CT guidance, a 17 gauge coaxial needle was advanced into the peripheral aspect of the mass. Appropriate positioning was confirmed and 5 core needle biopsy samples were obtained with an 18 gauge core needle biopsy device. The co-axial needle was removed following administration of a Gel-Foam slurry and superficial hemostasis was achieved with manual compression. Procedure complicated by development of transient asymptomatic bradycardia as well as a minimal amount of likely expected peri biopsy retroperitoneal hematoma without intraperitoneal extension. A dressing was applied. The patient otherwise tolerated the procedure well without additional immediate postprocedural complication. IMPRESSION: Technically successful CT guided core needle biopsy of indeterminate soft tissue nodule/mass within the left iliopsoas musculature. Electronically Signed   By: Sandi Mariscal M.D.   On: 03/03/2021 12:11   DG Abdomen Acute W/Chest  Result Date: 03/09/2021 CLINICAL DATA:  Constipation EXAM: DG ABDOMEN ACUTE WITH 1 VIEW CHEST COMPARISON:  02/10/2008 FINDINGS: Moderate stool burden throughout the colon. Radiation seeds in the region of the prostate. Prior cholecystectomy. Nonobstructive bowel gas pattern. No organomegaly or free air. No confluent airspace opacities. Heart is normal size. Aortic atherosclerosis. Prior left shoulder replacement. IMPRESSION: No evidence of bowel obstruction or free  air. Moderate stool burden throughout the colon. No acute cardiopulmonary disease. Electronically Signed   By: Rolm Baptise M.D.   On: 03/09/2021 23:27   23:27      IMPRESSION: 86 year old gentleman with a symptomatic 4.8 cm left psoas metastasis from renal cell carcinoma.  He is not an optimal candidate for systemic therapy or surgical resection.  The patient may be a good candidate for SBRT for local control of the psoas lesion.  He also has small lung nodules which will require follow-up and may be amenable to SBRT if they enlarge.  PLAN:Today, I talked to the patient and family about the findings and work-up thus far.  We discussed the natural history of oligometastatic renal cell carcinoma and general treatment, highlighting the role of ablative stereotactic radiotherapy in the management.  We discussed the available radiation techniques, and focused on the details of logistics and delivery.  We reviewed the anticipated acute and late sequelae associated with radiation in this setting.  The patient was encouraged to ask questions that I answered to the best of my ability.    The patient would like to proceed with radiation and will be scheduled for CT simulation next Friday at 1 pm.  I spent 60 minutes minutes in preparation reviewing old records, recent imaging, face to face with the patient and subsequent coordination of care and documentation.   ------------------------------------------------   Tyler Pita, MD [place]: [phone]  Fax: [phone] [email or web address]  Skype  LinkedIn

## 2021-03-24 ENCOUNTER — Encounter: Payer: Self-pay | Admitting: Urology

## 2021-03-24 ENCOUNTER — Other Ambulatory Visit: Payer: Self-pay

## 2021-03-24 ENCOUNTER — Ambulatory Visit
Admission: RE | Admit: 2021-03-24 | Discharge: 2021-03-24 | Disposition: A | Discharge status: Home / Self Care | Payer: Medicare Other | Source: Ambulatory Visit | Attending: Radiation Oncology | Admitting: Radiation Oncology

## 2021-03-24 ENCOUNTER — Encounter: Payer: Self-pay | Admitting: Radiation Oncology

## 2021-03-24 VITALS — BP 128/64 | HR 71 | Temp 97.7°F | Resp 20 | Ht 67.0 in | Wt 151.2 lb

## 2021-03-24 DIAGNOSIS — I2510 Atherosclerotic heart disease of native coronary artery without angina pectoris: Secondary | ICD-10-CM | POA: Insufficient documentation

## 2021-03-24 DIAGNOSIS — C7801 Secondary malignant neoplasm of right lung: Secondary | ICD-10-CM

## 2021-03-24 DIAGNOSIS — C642 Malignant neoplasm of left kidney, except renal pelvis: Secondary | ICD-10-CM | POA: Insufficient documentation

## 2021-03-24 DIAGNOSIS — E785 Hyperlipidemia, unspecified: Secondary | ICD-10-CM | POA: Insufficient documentation

## 2021-03-24 DIAGNOSIS — C649 Malignant neoplasm of unspecified kidney, except renal pelvis: Secondary | ICD-10-CM | POA: Insufficient documentation

## 2021-03-24 DIAGNOSIS — R918 Other nonspecific abnormal finding of lung field: Secondary | ICD-10-CM | POA: Diagnosis not present

## 2021-03-24 DIAGNOSIS — Z87891 Personal history of nicotine dependence: Secondary | ICD-10-CM | POA: Insufficient documentation

## 2021-03-24 DIAGNOSIS — C7989 Secondary malignant neoplasm of other specified sites: Secondary | ICD-10-CM | POA: Diagnosis not present

## 2021-03-24 DIAGNOSIS — I129 Hypertensive chronic kidney disease with stage 1 through stage 4 chronic kidney disease, or unspecified chronic kidney disease: Secondary | ICD-10-CM | POA: Insufficient documentation

## 2021-03-24 DIAGNOSIS — I700 Atherosclerosis of aorta: Secondary | ICD-10-CM | POA: Insufficient documentation

## 2021-03-24 DIAGNOSIS — Z87442 Personal history of urinary calculi: Secondary | ICD-10-CM | POA: Diagnosis not present

## 2021-03-24 DIAGNOSIS — C61 Malignant neoplasm of prostate: Secondary | ICD-10-CM

## 2021-03-24 DIAGNOSIS — N189 Chronic kidney disease, unspecified: Secondary | ICD-10-CM | POA: Diagnosis not present

## 2021-03-24 HISTORY — DX: Malignant neoplasm of prostate: C61

## 2021-03-24 NOTE — Progress Notes (Signed)
GU Location of Tumor / Histology: symptomatic 4.8 cm left psoas metastasis from renal cell carcinoma  Donald Todd presented on February 11, 2021 he presented with abdominal distention and underwent CT scan of the abdomen and pelvis which showed a 4.8 x 2.8 cm mass in the upper left psoas muscle.  No evidence of other disease noted at that time.     Past/Anticipated interventions by urology, if any: management of prostate cancer, nephrectomy, referral to Dr. Alen Blew for management of renal cell carcinoma  Past/Anticipated interventions by medical oncology, if any: Not a candidate for surgical or systemic management.  Weight changes, if any: Down 15 lb in 3 months  Bowel/Bladder complaints, if any: Denies hematuria. Denies dysuria. Reports urinary leakage unchanged. Reports wearing a pad. Reports he must change his pad twice per day. Reports constipation. Reports attempting to manage constipation with Miralax and Colace.   Nausea/Vomiting, if any: denies  Pain issues, if any:  Denies pain currently. Reports sometimes in the morning he has pain in the center of his back at his beltline that eventually resolves when he sits in his heated recliner. Reports numbness and tingling in both hands (left worse than right) x several months.  SAFETY ISSUES:  Prior radiation? Yes, prostate brachytherapy in 2002  Pacemaker/ICD? denies  Possible current pregnancy? no, male patient  Is the patient on methotrexate? denies  Current Complaints / other details:  87 year old male. Married with 5 children.  Staging chest CT revealed 3 pulmonary nodules that will be followed and if the enlarge could be amendable to SBRT. Patient denies cough, SOB or dyspnea.

## 2021-03-26 ENCOUNTER — Other Ambulatory Visit: Payer: Self-pay | Admitting: Cardiovascular Disease

## 2021-04-02 ENCOUNTER — Ambulatory Visit
Admission: RE | Admit: 2021-04-02 | Discharge: 2021-04-02 | Disposition: A | Discharge status: Home / Self Care | Payer: Medicare Other | Source: Ambulatory Visit | Attending: Radiation Oncology | Admitting: Radiation Oncology

## 2021-04-02 ENCOUNTER — Other Ambulatory Visit: Payer: Self-pay

## 2021-04-02 DIAGNOSIS — C649 Malignant neoplasm of unspecified kidney, except renal pelvis: Secondary | ICD-10-CM

## 2021-04-02 DIAGNOSIS — C7989 Secondary malignant neoplasm of other specified sites: Secondary | ICD-10-CM | POA: Insufficient documentation

## 2021-04-02 DIAGNOSIS — C642 Malignant neoplasm of left kidney, except renal pelvis: Secondary | ICD-10-CM | POA: Diagnosis not present

## 2021-04-03 NOTE — Progress Notes (Signed)
  Radiation Oncology         [phone] ________________________________  Name: MCKENNA BORUFF MRN: [id number]  Date: 04/02/2021  DOB: 12/31/1928  STEREOTACTIC BODY RADIOTHERAPY SIMULATION AND TREATMENT PLANNING NOTE    ICD-10-CM   1. 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  Identity was confirmed.  All relevant records and images related to the planned course of therapy were reviewed.  The patient freely provided informed written consent to proceed with treatment after reviewing the details related to the planned course of therapy. The consent form was witnessed and verified by the simulation staff.  Then, the patient was set-up in a stable reproducible  supine position for radiation therapy.  A BodyFix immobilization pillow was fabricated for reproducible positioning.  Surface markings were placed.  The CT images were loaded into the planning software.  The gross target volumes (GTV) and planning target volumes (PTV) were delinieated, and avoidance structures were contoured.  Treatment planning then occurred.  The radiation prescription was entered and confirmed.  A total of two complex treatment devices were fabricated in the form of the BodyFix immobilization pillow and a neck accuform cushion.  I have requested : 3D Simulation  I have requested a DVH of the following structures: targets and all normal structures near the target including bowel, kidney, bladder, spinal cord and skin as noted on the radiation plan to maintain doses in adherence with established limits  SPECIAL TREATMENT PROCEDURE:  The planned course of therapy using radiation constitutes a special treatment procedure. Special care is required in the management of this patient for the following reasons.  High dose per fraction requiring special monitoring for increased toxicities of treatment including daily imaging..  The special nature of the planned course of radiotherapy will require increased physician supervision and oversight to ensure patient's safety with optimal treatment outcomes.  PLAN:  The patient will receive 50 Gy in 5 fractions.  ________________________________  Sheral Apley Tammi Klippel, M.D.

## 2021-04-08 DIAGNOSIS — C7989 Secondary malignant neoplasm of other specified sites: Secondary | ICD-10-CM | POA: Diagnosis not present

## 2021-04-08 DIAGNOSIS — C7801 Secondary malignant neoplasm of right lung: Secondary | ICD-10-CM | POA: Insufficient documentation

## 2021-04-08 DIAGNOSIS — C61 Malignant neoplasm of prostate: Secondary | ICD-10-CM | POA: Diagnosis not present

## 2021-04-08 DIAGNOSIS — C649 Malignant neoplasm of unspecified kidney, except renal pelvis: Secondary | ICD-10-CM | POA: Insufficient documentation

## 2021-04-08 DIAGNOSIS — C642 Malignant neoplasm of left kidney, except renal pelvis: Secondary | ICD-10-CM | POA: Diagnosis not present

## 2021-04-13 ENCOUNTER — Ambulatory Visit
Admission: RE | Admit: 2021-04-13 | Discharge: 2021-04-13 | Disposition: A | Discharge status: Home / Self Care | Payer: Medicare Other | Source: Ambulatory Visit | Attending: Radiation Oncology | Admitting: Radiation Oncology

## 2021-04-13 ENCOUNTER — Other Ambulatory Visit: Payer: Self-pay

## 2021-04-13 DIAGNOSIS — C61 Malignant neoplasm of prostate: Secondary | ICD-10-CM

## 2021-04-13 DIAGNOSIS — C7989 Secondary malignant neoplasm of other specified sites: Secondary | ICD-10-CM | POA: Diagnosis not present

## 2021-04-13 DIAGNOSIS — C7801 Secondary malignant neoplasm of right lung: Secondary | ICD-10-CM | POA: Diagnosis not present

## 2021-04-13 DIAGNOSIS — C649 Malignant neoplasm of unspecified kidney, except renal pelvis: Secondary | ICD-10-CM | POA: Diagnosis not present

## 2021-04-14 ENCOUNTER — Ambulatory Visit: Payer: Medicare Other | Admitting: Radiation Oncology

## 2021-04-15 ENCOUNTER — Other Ambulatory Visit: Payer: Self-pay

## 2021-04-15 ENCOUNTER — Ambulatory Visit
Admission: RE | Admit: 2021-04-15 | Discharge: 2021-04-15 | Disposition: A | Discharge status: Home / Self Care | Payer: Medicare Other | Source: Ambulatory Visit | Attending: Radiation Oncology | Admitting: Radiation Oncology

## 2021-04-15 DIAGNOSIS — C649 Malignant neoplasm of unspecified kidney, except renal pelvis: Secondary | ICD-10-CM | POA: Diagnosis not present

## 2021-04-15 DIAGNOSIS — C7989 Secondary malignant neoplasm of other specified sites: Secondary | ICD-10-CM | POA: Diagnosis not present

## 2021-04-15 DIAGNOSIS — C7801 Secondary malignant neoplasm of right lung: Secondary | ICD-10-CM | POA: Diagnosis not present

## 2021-04-15 DIAGNOSIS — C61 Malignant neoplasm of prostate: Secondary | ICD-10-CM | POA: Diagnosis not present

## 2021-04-16 ENCOUNTER — Ambulatory Visit: Payer: Medicare Other | Admitting: Radiation Oncology

## 2021-04-19 ENCOUNTER — Ambulatory Visit
Admission: RE | Admit: 2021-04-19 | Discharge: 2021-04-19 | Disposition: A | Discharge status: Home / Self Care | Payer: Medicare Other | Source: Ambulatory Visit | Attending: Radiation Oncology | Admitting: Radiation Oncology

## 2021-04-19 DIAGNOSIS — C649 Malignant neoplasm of unspecified kidney, except renal pelvis: Secondary | ICD-10-CM | POA: Diagnosis not present

## 2021-04-19 DIAGNOSIS — C7801 Secondary malignant neoplasm of right lung: Secondary | ICD-10-CM | POA: Diagnosis not present

## 2021-04-19 DIAGNOSIS — C7989 Secondary malignant neoplasm of other specified sites: Secondary | ICD-10-CM | POA: Diagnosis not present

## 2021-04-19 DIAGNOSIS — C61 Malignant neoplasm of prostate: Secondary | ICD-10-CM | POA: Diagnosis not present

## 2021-04-21 ENCOUNTER — Ambulatory Visit
Admission: RE | Admit: 2021-04-21 | Discharge: 2021-04-21 | Disposition: A | Discharge status: Home / Self Care | Payer: Medicare Other | Source: Ambulatory Visit | Attending: Radiation Oncology | Admitting: Radiation Oncology

## 2021-04-21 ENCOUNTER — Other Ambulatory Visit: Payer: Self-pay

## 2021-04-21 DIAGNOSIS — C649 Malignant neoplasm of unspecified kidney, except renal pelvis: Secondary | ICD-10-CM

## 2021-04-21 DIAGNOSIS — C7989 Secondary malignant neoplasm of other specified sites: Secondary | ICD-10-CM | POA: Diagnosis not present

## 2021-04-21 DIAGNOSIS — C7801 Secondary malignant neoplasm of right lung: Secondary | ICD-10-CM | POA: Diagnosis not present

## 2021-04-21 DIAGNOSIS — C61 Malignant neoplasm of prostate: Secondary | ICD-10-CM | POA: Diagnosis not present

## 2021-04-23 ENCOUNTER — Encounter: Payer: Self-pay | Admitting: Urology

## 2021-04-23 ENCOUNTER — Other Ambulatory Visit: Payer: Self-pay

## 2021-04-23 ENCOUNTER — Ambulatory Visit
Admission: RE | Admit: 2021-04-23 | Discharge: 2021-04-23 | Disposition: A | Discharge status: Home / Self Care | Payer: Medicare Other | Source: Ambulatory Visit | Attending: Radiation Oncology | Admitting: Radiation Oncology

## 2021-04-23 DIAGNOSIS — C649 Malignant neoplasm of unspecified kidney, except renal pelvis: Secondary | ICD-10-CM | POA: Diagnosis not present

## 2021-04-23 DIAGNOSIS — C7801 Secondary malignant neoplasm of right lung: Secondary | ICD-10-CM | POA: Diagnosis not present

## 2021-04-23 DIAGNOSIS — C61 Malignant neoplasm of prostate: Secondary | ICD-10-CM | POA: Diagnosis not present

## 2021-04-23 DIAGNOSIS — C642 Malignant neoplasm of left kidney, except renal pelvis: Secondary | ICD-10-CM | POA: Diagnosis not present

## 2021-04-23 DIAGNOSIS — C7989 Secondary malignant neoplasm of other specified sites: Secondary | ICD-10-CM

## 2021-04-30 DIAGNOSIS — H353211 Exudative age-related macular degeneration, right eye, with active choroidal neovascularization: Secondary | ICD-10-CM | POA: Diagnosis not present

## 2021-04-30 DIAGNOSIS — Z961 Presence of intraocular lens: Secondary | ICD-10-CM | POA: Diagnosis not present

## 2021-04-30 DIAGNOSIS — H353222 Exudative age-related macular degeneration, left eye, with inactive choroidal neovascularization: Secondary | ICD-10-CM | POA: Diagnosis not present

## 2021-05-10 ENCOUNTER — Other Ambulatory Visit: Payer: Self-pay

## 2021-05-10 MED ORDER — NITROGLYCERIN 0.4 MG SL SUBL: 0.4000 mg | SUBLINGUAL_TABLET | SUBLINGUAL | 3 refills | Status: DC | PRN

## 2021-05-10 NOTE — Telephone Encounter (Signed)
This is Dr. Berry's pt 

## 2021-05-17 ENCOUNTER — Other Ambulatory Visit: Payer: Self-pay | Admitting: Cardiovascular Disease

## 2021-05-20 DIAGNOSIS — C642 Malignant neoplasm of left kidney, except renal pelvis: Secondary | ICD-10-CM | POA: Diagnosis not present

## 2021-05-20 DIAGNOSIS — Z8546 Personal history of malignant neoplasm of prostate: Secondary | ICD-10-CM | POA: Diagnosis not present

## 2021-06-02 ENCOUNTER — Ambulatory Visit
Admission: RE | Admit: 2021-06-02 | Discharge: 2021-06-02 | Disposition: A | Discharge status: Home / Self Care | Payer: Medicare Other | Source: Ambulatory Visit | Attending: Urology | Admitting: Urology

## 2021-06-02 ENCOUNTER — Other Ambulatory Visit: Payer: Self-pay

## 2021-06-02 DIAGNOSIS — C649 Malignant neoplasm of unspecified kidney, except renal pelvis: Secondary | ICD-10-CM

## 2021-06-02 NOTE — Progress Notes (Signed)
  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  Volumetric arc fields were employed to deliver 6 MV X-rays.  Image guidance was performed with per fraction cone beam CT prior to treatment under personal MD supervision.  Immobilization was achieved using BodyFix Pillow.  Narrative: The patient tolerated radiation treatment relatively well with some mild, low-grade nausea and decreased appetite.  He denied abdominal pain, vomiting or diarrhea.  Plan: The patient has completed radiation treatment. The patient will return to radiation oncology clinic for routine followup in one month. I advised them to call or return sooner if they have any questions or concerns related to their recovery or treatment. ________________________________  Sheral Apley. Tammi Klippel, M.D.

## 2021-06-02 NOTE — Progress Notes (Signed)
Patient is aware that this is a phone visit. Patients meaningful use is complete. Patient denies any issues with his bowels. Patient denies any fatigue. Patient denies any n/v. Patient denies any issues with his skin. Patient reports  nocturia x  2-3. Patient states that he empties his bladder with urination . Patient denies any urgency.

## 2021-06-02 NOTE — Progress Notes (Signed)
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 Nila Nephew and Dr. Gaynelle Arabian using Palladium-103 seeds  Narrative:  I spoke with the patient to conduct his routine scheduled 1 month follow up visit via telephone to spare the patient unnecessary potential exposure in the healthcare setting during the current COVID-19 pandemic.  The patient was notified in advance and gave permission to proceed with this visit format.  He tolerated radiation treatment relatively well with some mild, low-grade nausea and decreased appetite.  He denied abdominal pain, vomiting or diarrhea.                              On review of systems, the patient states that he is doing well in general.  He continues with somewhat decreased appetite but feels that this is improving.  He also complains of constipation which is improved with stool softeners and MiraLAX as needed.  He has some occasional twinges of pain in the left side but nothing that warrants pain medication.  He denies abdominal pain, nausea, vomiting or diarrhea and has not had any recent fever, chills or night sweats.  He reports that he is gradually regaining weight which he is pleased with as well as improved energy levels over the past 2 weeks.  Overall, he is pleased with his progress to date.  ALLERGIES:  is allergic to tape, hydrocodone, iohexol, and oxycodone.  Meds: Current Outpatient Medications  Medication Sig Dispense Refill   amLODipine (NORVASC) 10 MG tablet TAKE 1 TABLET(10  MG) BY MOUTH DAILY 90 tablet 3   aspirin 81 MG tablet Take 81 mg by mouth at bedtime.     atorvastatin (LIPITOR) 40 MG tablet TAKE 1 TABLET AT BEDTIME 90 tablet 3   docusate sodium (COLACE) 100 MG capsule Take 1 capsule (100 mg total) by mouth every 12 (twelve) hours. 60 capsule 0   enalapril (VASOTEC) 10 MG tablet TAKE 1 TABLET DAILY (Patient taking differently: Take 10 mg by mouth at bedtime.) 90 tablet 3   ipratropium (ATROVENT) 0.03 % nasal spray Place 1-2 sprays into both nostrils 2 (two) times daily as needed (allergies).      metoprolol succinate (TOPROL-XL) 25 MG 24 hr tablet TAKE 1 TABLET DAILY (Patient taking differently: Take 25 mg by mouth daily.) 90 tablet 3   Multiple Vitamins-Minerals (PRESERVISION AREDS 2) CAPS Take 1 capsule by mouth 2 (two) times daily.     nitroGLYCERIN (NITROSTAT) 0.4 MG SL tablet Place 1 tablet (0.4 mg total) under the tongue as needed. 25 tablet 3   ofloxacin (OCUFLOX) 0.3 % ophthalmic solution Place 1 drop into the right eye See admin instructions. Instill 1 drop into the right eye 4 times daily 3 days before and 3 days after eye injections     polyethylene glycol (MIRALAX / GLYCOLAX) 17 g packet Take 17 g by mouth daily. 14 each 2   acetaminophen (TYLENOL) 500 MG tablet Take 1,000 mg by  mouth every 6 (six) hours as needed for moderate pain. (Patient not taking: Reported on 06/02/2021)     amoxicillin (AMOXIL) 500 MG tablet Take 2,000 mg by mouth See admin instructions. Take 2000 mg 1 hour prior to dental work (Patient not taking: No sig reported)     calcium carbonate (TUMS - DOSED IN MG ELEMENTAL CALCIUM) 500 MG chewable tablet Chew 1,000 mg by mouth daily as needed for indigestion or heartburn. (Patient not taking: Reported on 06/02/2021)     glycerin adult 2 g suppository Place 1 suppository rectally as needed for constipation. (Patient not taking: Reported on 06/02/2021) 12 suppository 0   No current facility-administered medications for this encounter.     Physical Findings:  vitals were not taken for this visit.   /Unable to assess due to telephone follow-up visit format.  Lab Findings: Lab Results  Component Value Date   WBC 8.1 03/03/2021   HGB 12.6 (L) 03/03/2021   HCT 39.7 03/03/2021   MCV 93.9 03/03/2021   PLT 174 03/03/2021     Radiographic Findings: No results found.  Impression/Plan: 23. 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 He appears to have recovered well from the effects of his recent stereotactic radiotherapy and is currently without complaints.  He had a recent follow-up visit with Dr. Lovena Neighbours approximately 1 week ago and is scheduled for follow-up with Dr. Alen Blew on 06/11/2021.  We discussed that while we are happy to continue to participate in his care if clinically indicated, at this point, we will plan to see him back on an as-needed basis.  He will continue in routine follow-up under the care and direction of Drs. Lovena Neighbours and [place] for continued monitoring and management of his systemic disease.  He appears to have a good understanding of this recommendation and is comfortable and in agreement with the stated plan.  We enjoyed taking care of him and look forward to continuing to follow his progress via correspondence.    Nicholos Johns, PA-C

## 2021-06-04 DIAGNOSIS — Z20822 Contact with and (suspected) exposure to covid-19: Secondary | ICD-10-CM | POA: Diagnosis not present

## 2021-06-07 NOTE — Progress Notes (Signed)
Cardiology Office Note:    Date:  06/14/2021   ID:  [name], DOB 03/08/1929, MRN [id number]  PCP:  [name], MD  Cardiologist:  Quay Burow, MD  Electrophysiologist:  None   Referring MD: [name], MD   Chief Complaint: follow-up of CAD  History of Present Illness:    Donald Todd is a 87 y.o. male with a history of CAD s/p remote stenting to RCA and ramus branch in 1997, hypertension, hyperlipidemia, CKD stage III, renal cancer s/p left nephrectomy, and prior prostate cancer who is followed by Dr. Gwenlyn Found and presents today for routine follow-up.   Patient has a long history of CAD. He underwent stenting to RCA and ramus branch in 1997. Last cardiac cath in 07/2010 showed patent stent with non-obstructive disease of LCX and LAD. He has had several nuclear stress test since then. Most recent [place] in 05/2020 which was done for further evaluation of atypical chest pain was low risk with a defect consistent with prior lateral infarct and minimal peri-infarct ischemia. No significant change from prior Myoview.  There is mention of right renal artery stenosis in prior note but renal artery ultrasound from 2013 and 2014 showed patent right renal artery.  Patient was last seen by Dr. Gwenlyn Found in 12/2020 at which time he still reported occasional atypical chest pain but this was felt to be noncardiac in nature given stable Myoview 6 months prior.   Patient presents today for follow-up. Here alone. Patient continues to note some atypical chest pain. He describes an occasional pain along his left side that shoots down from axilla. Only last for a few seconds at a time. No other chest pain. No exertional symptoms. He cannot remember if this is the same type of pain that led to his Myoview in 05/2020; however, he states this has been going on for a while He noticed it after he had shoulder surgery about 1 year ago. Does not sound like angina - possibly musculoskeletal  in nature. No shortness of breath, orthopnea, or PND. He notes occasional lower extremity edema especially if he is on his feet a lot. Improves with elevating legs. It looks like he has some varicose veins on his right leg but these are not painful. No palpitations. He does report occasional mild dizziness. He has a history of vertigo and has had a couple of episodes of this. No syncope.  Of note, since last visit, he has been diagnosed with cancer again. He was originally diagnosed with left renal cancer cell carcinoma in the early 2000s. S/p left nephrectomy in 2005. In 3/022, he was found to have a pelvic mass of the left psoas muscle which turned out to be cancer. He is currently undergoing radiation but may need chemotherapy in the future.  Past Medical History:  Diagnosis Date   Arthritis    CAD (coronary artery disease)    last cath. 07-09-2010   Cancer [facility])    kidney cancer , prostate cancer    Chronic kidney disease    lt kidnet removed   H/O cardiovascular stress test 08-04-2010   ef 55% NO ISCHEMIA   H/O unilateral nephrectomy    lt. nephrectomy   Heart attack ([place]) 1993   History of kidney stones    Hyperlipemia    Hypertension    renal dopplers 218.13-normal patency   Inguinal hernia 08/01/11   right   Macular degeneration    Prostate cancer [facility])    Prostate  disease    Cancer S/P seed implant   Renal mass    Vertigo     Past Surgical History:  Procedure Laterality Date   ANGIOPLASTY  1993   athroscopic knee surgery  2002   right   bone scan  2005   CARDIAC CATHETERIZATION  07-09-2010   normal left ventricular function, coronary obsstructive disease with 20% proximal an 30-40% mid lt. anterior descending narrowing ; widely patent stent in the proximal ramus intermediate vessel; 50-70-% narrowing in the mid circumflex,and widely patent stent  in the rt. coronary    CARDIAC CATHETERIZATION  12-22-2003   widely patent stents with noncritical coronary artery disease and  normalLV function   CHOLECYSTECTOMY  05/05/2009   Laparoscopic   colonscopy  2004   with biopsy   CORONARY ANGIOPLASTY WITH STENT PLACEMENT  03-15-1996   pci to rci and ramus branch using j&j [id number] stent,post dialated  at  20atm (rca)  and 14 atm (ramus branch. ef 55%             HERNIA REPAIR  08/01/11   [facility]   INGUINAL HERNIA REPAIR Left 03/29/2013   Procedure: HERNIA REPAIR INGUINAL ADULT;  Surgeon: Edward Jolly, MD;  Location: [facility];  Service: General;  Laterality: Left;   INSERTION OF MESH Left 03/29/2013   Procedure: INSERTION OF MESH;  Surgeon: Edward Jolly, MD;  Location: [facility];  Service: General;  Laterality: Left;   JOINT REPLACEMENT  2006 and 2010   left 2006, right 2010   KIDNEY SURGERY  2002 and 2005   2002 - partial removal of left. 2005 complete removal   PROSTATE BIOPSY     prostate seed implant     radiation treatment  2002   prostate   REVERSE SHOULDER ARTHROPLASTY Left 05/26/2017   Procedure: REVERSE LEFT TOTAL SHOULDER ARTHROPLASTY;  Surgeon: Netta Cedars, MD;  Location: [place];  Service: Orthopedics;  Laterality: Left;   stent implants  1997    Current Medications: Current Meds  Medication Sig   acetaminophen (TYLENOL) 500 MG tablet Take 1,000 mg by mouth every 6 (six) hours as needed for moderate pain.   amLODipine (NORVASC) 10 MG tablet TAKE 1 TABLET(10 MG) BY MOUTH DAILY   amoxicillin (AMOXIL) 500 MG tablet Take 2,000 mg by mouth See admin instructions. Take 2000 mg 1 hour prior to dental work   aspirin 81 MG tablet Take 81 mg by mouth at bedtime.   atorvastatin (LIPITOR) 40 MG tablet TAKE 1 TABLET AT BEDTIME   calcium carbonate (TUMS - DOSED IN MG ELEMENTAL CALCIUM) 500 MG chewable tablet Chew 1,000 mg by mouth daily as needed for indigestion or heartburn.   docusate sodium (COLACE) 100 MG capsule Take 1 capsule (100 mg total) by mouth every 12 (twelve) hours.   enalapril (VASOTEC) 10 MG tablet TAKE 1 TABLET DAILY (Patient taking differently: Take  10 mg by mouth at bedtime.)   ipratropium (ATROVENT) 0.03 % nasal spray Place 1-2 sprays into both nostrils 2 (two) times daily as needed (allergies).    metoprolol succinate (TOPROL-XL) 25 MG 24 hr tablet TAKE 1 TABLET DAILY (Patient taking differently: Take 25 mg by mouth daily.)   Multiple Vitamins-Minerals (PRESERVISION AREDS 2) CAPS Take 1 capsule by mouth 2 (two) times daily.   nitroGLYCERIN (NITROSTAT) 0.4 MG SL tablet Place 1 tablet (0.4 mg total) under the tongue as needed.   ofloxacin (OCUFLOX) 0.3 % ophthalmic solution Place 1 drop into the right eye See admin  instructions. Instill 1 drop into the right eye 4 times daily 3 days before and 3 days after eye injections   polyethylene glycol (MIRALAX / GLYCOLAX) 17 g packet Take 17 g by mouth daily.     Allergies:   Tape, Hydrocodone, Iohexol, and Oxycodone   Social History   Socioeconomic History   Marital status: Married    Spouse name: Not on file   Number of children: 5   Years of education: 12   Highest education level: Not on file  Occupational History   Occupation: Retired    Comment: Firefighter, Location manager office  Tobacco Use   Smoking status: Former    Packs/day: 0.25    Years: 13.00    Pack years: 3.25    Types: Cigarettes, Cigars    Quit date: 12/14/1963    Years since quitting: 57.5   Smokeless tobacco: Never  Vaping Use   Vaping Use: Never used  Substance and Sexual Activity   Alcohol use: No   Drug use: No   Sexual activity: Not Currently  Other Topics Concern   Not on file  Social History Narrative   Lives at home w/ his wife   Right-handed   Caffeine: 2 cups of coffee per day, rare soft drink   Social Determinants of Health   Financial Resource Strain: Not on file  Food Insecurity: Not on file  Transportation Needs: Not on file  Physical Activity: Not on file  Stress: Not on file  Social Connections: Not on file     Family History: The patient's family history includes Cancer in his mother; Skin  cancer in his brother; Stroke in his brother and father.  ROS:   Please see the history of present illness.     EKGs/Labs/Other Studies Reviewed:    The following studies were reviewed today:  Renal Artery Duplex 01/28/2013: Summary: - Right renal artery: normal patency with no evidence of significant diameter reduction. - Right kidney: normal in size, symmetrical in shape with normal medulla. No abnormal echogenicity or hydronephrosis. There is a simple appearing cyst located within the lower pole.  - Left kidney: history of nephrectomy  This is a normal right renal artery duplex. When compared to prior studies, results are unchanged. It is recommended that this exam be repeated when clinically indicated.  _______________  Leane Call 05/07/2020: The left ventricular ejection fraction is mildly decreased (45-54%). Nuclear stress EF: 53%. There was no ST segment deviation noted during stress. Defect 1: There is a medium defect of moderate severity. This is a low risk study. Findings consistent with prior myocardial infarction with peri-infarct ischemia.   Abnormal, low risk stress nuclear study with prior lateral infarct and minimal peri-infarct ischemia.  Gated ejection fraction 53% with hypokinesis of the lateral wall.  EKG:  EKG not ordered today.   Recent Labs: 02/11/2021: ALT 22; BUN 26; Creatinine, Ser 1.30; Potassium 4.8; Sodium 139 03/03/2021: Hemoglobin 12.6; Platelets 174  Recent Lipid Panel    Component Value Date/Time   CHOL  05/05/2009 0313    124        ATP III CLASSIFICATION:  <200     mg/dL   Desirable  200-239  mg/dL   Borderline High  >=240    mg/dL   High          TRIG 116 05/05/2009 0313   HDL 39 (L) 05/05/2009 0313   CHOLHDL 3.2 05/05/2009 0313   VLDL 23 05/05/2009 0313   LDLCALC  05/05/2009 9030  62        Total Cholesterol/HDL:CHD Risk Coronary Heart Disease Risk Table                     Men   Women  1/2 Average Risk   3.4   3.3  Average  Risk       5.0   4.4  2 X Average Risk   9.6   7.1  3 X Average Risk  23.4   11.0        Use the calculated Patient Ratio above and the CHD Risk Table to determine the patient's CHD Risk.        ATP III CLASSIFICATION (LDL):  <100     mg/dL   Optimal  100-129  mg/dL   Near or Above                    Optimal  130-159  mg/dL   Borderline  160-189  mg/dL   High  >190     mg/dL   Very High    Physical Exam:    Vital Signs: BP 124/68 (BP Location: Left Arm, Patient Position: Sitting, Cuff Size: Normal)   Pulse 62   Ht _0  (1.702 m)   Wt 146 lb 6.4 oz (66.4 kg)   SpO2 98%   BMI 22.93 kg/m     Wt Readings from Last 3 Encounters:  06/14/21 146 lb 6.4 oz (66.4 kg)  06/11/21 145 lb 9 oz (66 kg)  03/24/21 151 lb 3.2 oz (68.6 kg)     General: 87 y.o. Caucasian male in no acute distress. HEENT: Normocephalic and atraumatic. Sclera clear.  Neck: Supple. No JVD. Heart: RRR. Distinct S1 and S2. No murmurs, gallops, or rubs. Radial pulses 2+ and equal bilaterally. Lungs: No increased work of breathing. Clear to ausculation bilaterally. No wheezes, rhonchi, or rales.  Abdomen: Soft, non-distended, and non-tender to palpation.  Extremities: No lower extremity edema. Varicose veins noted on right lower extremity. Skin: Warm and dry. Neuro: Alert and oriented x3. No focal deficits. Psych: Normal affect. Responds appropriately.  Assessment:    1. Coronary artery disease involving native coronary artery of native heart without angina pectoris   2. Primary hypertension   3. Hyperlipidemia, unspecified hyperlipidemia type   4. Stage 3 chronic kidney disease, unspecified whether stage 3a or 3b CKD ([place])     Plan:    CAD - S/p remote stenting to RCA and ramus branch in 1997. Most recent Myoview in 05/2020 was low risk with evidence prior lateral infarct and minimal peri-infarct ischemia. Unchanged from prior Myoview. - He continues to have atypical chest pain that only last a few  seconds at a time. No exertional symptoms. - Continue aspirin and high-intensity statin. - Symptoms do not sound like angina. Possibly musculoskeletal in nature (patient noticed this after shoulder surgery about 1 year ago). No further work-up necessary at this time. However, advised patient to let us know if symptoms worsen or he has exertional chest pain.  Hypertension - BP well controlled. - Continue current medications: Amlodipine 73m daily, Toprol-XL 254mdaily, Enalapril 1014maily.   Hyperlipidemia - Lipid panel in 10/2020 (per [facility]): Total Cholesterol 137, Triglycerides 97, HDL 65, LDL 54.  - At LDL goal of <70 given CAD. - Continue Lipitor 9m27mily.   CKD Stage III Renal Cancer s/p Left Nephrectomy - Baseline creatinine 1.1 to 1.5. - Recently diagnosed with stage IV cancer after he was  found to have pelvic mass of the left psoas muscle. Currently undergoing radiation but may need chemotherapy in the future.   Disposition: Follow up in 6 months with Dr. Gwenlyn Found.    Medication Adjustments/Labs and Tests Ordered: Current medicines are reviewed at length with the patient today.  Concerns regarding medicines are outlined above.  No orders of the defined types were placed in this encounter.  No orders of the defined types were placed in this encounter.   There are no Patient Instructions on file for this visit.   Signed, Darreld Mclean, PA-C  06/14/2021 1:36 PM    [facility]

## 2021-06-11 ENCOUNTER — Other Ambulatory Visit: Payer: Self-pay

## 2021-06-11 ENCOUNTER — Inpatient Hospital Stay: Payer: Medicare Other | Attending: Oncology | Admitting: Oncology

## 2021-06-11 VITALS — BP 147/64 | HR 78 | Temp 98.2°F | Resp 18 | Wt 145.6 lb

## 2021-06-11 DIAGNOSIS — Z7982 Long term (current) use of aspirin: Secondary | ICD-10-CM | POA: Diagnosis not present

## 2021-06-11 DIAGNOSIS — Z85528 Personal history of other malignant neoplasm of kidney: Secondary | ICD-10-CM | POA: Insufficient documentation

## 2021-06-11 DIAGNOSIS — I10 Essential (primary) hypertension: Secondary | ICD-10-CM | POA: Diagnosis not present

## 2021-06-11 DIAGNOSIS — Z923 Personal history of irradiation: Secondary | ICD-10-CM | POA: Diagnosis not present

## 2021-06-11 DIAGNOSIS — C642 Malignant neoplasm of left kidney, except renal pelvis: Secondary | ICD-10-CM | POA: Diagnosis not present

## 2021-06-11 DIAGNOSIS — Z79899 Other long term (current) drug therapy: Secondary | ICD-10-CM | POA: Diagnosis not present

## 2021-06-11 DIAGNOSIS — Z905 Acquired absence of kidney: Secondary | ICD-10-CM | POA: Diagnosis not present

## 2021-06-11 NOTE — Progress Notes (Signed)
Hematology and Oncology Follow Up Visit  Donald Todd [id number] Sep 24, 1929 87 y.o. 06/11/2021 1:28 PM [name], MDRoss, Dwyane Luo, MD   Principle Diagnosis: 87 year old man with kidney cancer diagnosed in 2005.  He developed a stage IV disease with pelvic mass of the left psoas muscle.   Prior Therapy:  He is status post partial nephrectomy completed on May 03, 2001.  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  He remained disease-free until recently.    On February 11, 2021 he presented with abdominal distention and underwent CT scan of the abdomen and pelvis which showed a 4.8 x 2.8 cm mass in the upper left psoas muscle.    He is status post radiation therapy to the left psoas mass completed on Apr 23, 2021.  He received 50 Gray in 5 fractions.  Current therapy: Active surveillance.  Interim History: Donald Todd returns today for a follow-up visit.  Since the last visit, he completed radiation therapy without any complications.  He denies any nausea, vomiting or abdominal pain.  He denies any recent hospitalization or illnesses.  His performance status and quality of life remained reasonable.  He has lost a significant and weight over the years but not recently.  He does ambulate with the help of all cane without any falls or syncope.     Medications: I have reviewed the patient's current medications.  Current Outpatient Medications  Medication Sig Dispense Refill   acetaminophen (TYLENOL) 500 MG tablet Take 1,000 mg by mouth every 6 (six) hours as needed for moderate pain. (Patient not taking: Reported on 06/02/2021)     amLODipine (NORVASC) 10 MG tablet TAKE 1 TABLET(10 MG) BY MOUTH DAILY 90 tablet 3   amoxicillin (AMOXIL) 500 MG tablet Take 2,000 mg by mouth See admin  instructions. Take 2000 mg 1 hour prior to dental work (Patient not taking: No sig reported)     aspirin 81 MG tablet Take 81 mg by mouth at bedtime.     atorvastatin (LIPITOR) 40 MG tablet TAKE 1 TABLET AT BEDTIME 90 tablet 3   calcium carbonate (TUMS - DOSED IN MG ELEMENTAL CALCIUM) 500 MG chewable tablet Chew 1,000 mg by mouth daily as needed for indigestion or heartburn. (Patient not taking: Reported on 06/02/2021)     docusate sodium (COLACE) 100 MG capsule Take 1 capsule (100 mg total) by mouth every 12 (twelve) hours. 60 capsule 0   enalapril (VASOTEC) 10 MG tablet TAKE 1 TABLET DAILY (Patient taking differently: Take 10 mg by mouth at bedtime.) 90 tablet 3   glycerin adult 2 g suppository Place 1 suppository rectally as needed for constipation. (Patient not taking: Reported on 06/02/2021) 12 suppository 0   ipratropium (ATROVENT) 0.03 % nasal spray Place 1-2 sprays into both nostrils 2 (two) times daily as needed (allergies).      metoprolol succinate (TOPROL-XL) 25 MG 24 hr tablet TAKE 1 TABLET DAILY (Patient taking differently: Take 25 mg by mouth daily.) 90 tablet 3   Multiple Vitamins-Minerals (PRESERVISION AREDS 2) CAPS Take 1 capsule by mouth 2 (two) times daily.     nitroGLYCERIN (NITROSTAT) 0.4 MG SL tablet Place 1 tablet (0.4 mg total) under the tongue as needed. 25 tablet 3   ofloxacin (OCUFLOX) 0.3 % ophthalmic solution Place 1  drop into the right eye See admin instructions. Instill 1 drop into the right eye 4 times daily 3 days before and 3 days after eye injections     polyethylene glycol (MIRALAX / GLYCOLAX) 17 g packet Take 17 g by mouth daily. 14 each 2   No current facility-administered medications for this visit.     Allergies:  Allergies  Allergen Reactions   Tape Hives    Surgical tape - causes blisters   Hydrocodone     Shaking uncontrollably    Iohexol      Desc: PT DOES RECALL REACTION JUST SAYS IT WAS A LONG TIME AGO FOR KIDNEY X-RAYS AND HE DIDN'T TOLERATE IT  WELL-ARS 05/02/09-NOTE E-CHART STATES A HOT FEELING IS HIS REACTION, BUT HE CAN'T CONFIRM THAT WAS ALL    Oxycodone Other (See Comments)    Shaking uncontrollably       Physical Exam:  ECOG: 1   General appearance: Comfortable appearing without any discomfort Head: Normocephalic without any trauma Oropharynx: Mucous membranes are moist and pink without any thrush or ulcers. Eyes: Pupils are equal and round reactive to light. Lymph nodes: No cervical, supraclavicular, inguinal or axillary lymphadenopathy.   Heart:regular rate and rhythm.  S1 and S2 without leg edema. Lung: Clear without any rhonchi or wheezes.  No dullness to percussion. Abdomin: Soft, nontender, nondistended with good bowel sounds.  No hepatosplenomegaly. Musculoskeletal: No joint deformity or effusion.  Full range of motion noted. Neurological: No deficits noted on motor, sensory and deep tendon reflex exam. Skin: No petechial rash or dryness.  Appeared moist.      Lab Results: Lab Results  Component Value Date   WBC 8.1 03/03/2021   HGB 12.6 (L) 03/03/2021   HCT 39.7 03/03/2021   MCV 93.9 03/03/2021   PLT 174 03/03/2021     Chemistry      Component Value Date/Time   NA 139 02/11/2021 1928   K 4.8 02/11/2021 1928   CL 107 02/11/2021 1928   CO2 20 (L) 02/11/2021 1928   BUN 26 (H) 02/11/2021 1928   CREATININE 1.30 (H) 02/11/2021 1928      Component Value Date/Time   CALCIUM 8.9 02/11/2021 1928   ALKPHOS 84 02/11/2021 1928   AST 22 02/11/2021 1928   ALT 22 02/11/2021 1928   BILITOT 0.7 02/11/2021 1928          Impression and Plan:  87 year old man with:   1.  Kidney cancer diagnosed in 2002.  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    His disease status was updated at this time and treatment options were reviewed.  I have recommended updating his staging scans in the near future to determine best course of action.  If radiation  controlled his left psoas mass without any additional areas of metastasis, period of observation would be recommended.  If he has other areas of metastatic disease, systemic therapy may be needed.  We discussed this option at this time and he is not a great candidate for it but could be a possibility if needed to palliate his disease.  Systemic therapy will be in the form of oral targeted therapy, immunotherapy or combination of the above.  Complication associated with these therapies were discussed including GI toxicity, autoimmune issues, excessive fatigue, hypertension among others.      2.  Follow-up: In the next 6 weeks for repeat evaluation and imaging studies.   30  minutes  were spent on this encounter.  The time was dedicated to reviewing his disease status, treatment choices and complications that at his cancer and cancer therapy.   Zola Button, MD 7/8/20221:28 PM

## 2021-06-14 ENCOUNTER — Ambulatory Visit (INDEPENDENT_AMBULATORY_CARE_PROVIDER_SITE_OTHER): Payer: Medicare Other | Admitting: Student

## 2021-06-14 ENCOUNTER — Other Ambulatory Visit: Payer: Self-pay

## 2021-06-14 ENCOUNTER — Encounter: Payer: Self-pay | Admitting: Student

## 2021-06-14 VITALS — BP 124/68 | HR 62 | Ht 67.0 in | Wt 146.4 lb

## 2021-06-14 DIAGNOSIS — I10 Essential (primary) hypertension: Secondary | ICD-10-CM

## 2021-06-14 DIAGNOSIS — N183 Chronic kidney disease, stage 3 unspecified: Secondary | ICD-10-CM

## 2021-06-14 DIAGNOSIS — E785 Hyperlipidemia, unspecified: Secondary | ICD-10-CM | POA: Diagnosis not present

## 2021-06-14 DIAGNOSIS — I2510 Atherosclerotic heart disease of native coronary artery without angina pectoris: Secondary | ICD-10-CM | POA: Diagnosis not present

## 2021-06-14 DIAGNOSIS — C642 Malignant neoplasm of left kidney, except renal pelvis: Secondary | ICD-10-CM

## 2021-06-14 NOTE — Patient Instructions (Signed)
Medication Instructions:  No Changes *If you need a refill on your cardiac medications before your next appointment, please call your pharmacy*   Lab Work: No labs If you have labs (blood work) drawn today and your tests are completely normal, you will receive your results only by: Hannah (if you have MyChart) OR A paper copy in the mail If you have any lab test that is abnormal or we need to change your treatment, we will call you to review the results.   Testing/Procedures: No Testing   Follow-Up: At [facility], you and your health needs are our priority.  As part of our continuing mission to provide you with exceptional heart care, we have created designated Provider Care Teams.  These Care Teams include your primary Cardiologist (physician) and Advanced Practice Providers (APPs -  Physician Assistants and Nurse Practitioners) who all work together to provide you with the care you need, when you need it.  We recommend signing up for the patient portal called "MyChart".  Sign up information is provided on this After Visit Summary.  MyChart is used to connect with patients for Virtual Visits (Telemedicine).  Patients are able to view lab/test results, encounter notes, upcoming appointments, etc.  Non-urgent messages can be sent to your provider as well.   To learn more about what you can do with MyChart, go to [email or web address].    Your next appointment:   6 month(s)  The format for your next appointment:   In Person  Provider:   Quay Burow, MD

## 2021-06-16 ENCOUNTER — Telehealth: Payer: Self-pay | Admitting: Oncology

## 2021-06-16 NOTE — Telephone Encounter (Signed)
Per 7/11 los, pt aware

## 2021-07-01 ENCOUNTER — Other Ambulatory Visit: Payer: Self-pay | Admitting: Cardiovascular Disease

## 2021-07-20 ENCOUNTER — Ambulatory Visit (HOSPITAL_COMMUNITY)
Admission: RE | Admit: 2021-07-20 | Discharge: 2021-07-20 | Disposition: A | Discharge status: Home / Self Care | Payer: Medicare Other | Source: Ambulatory Visit | Attending: Oncology | Admitting: Oncology

## 2021-07-20 ENCOUNTER — Other Ambulatory Visit: Payer: Self-pay

## 2021-07-20 ENCOUNTER — Inpatient Hospital Stay: Payer: Medicare Other | Attending: Oncology

## 2021-07-20 DIAGNOSIS — Z79899 Other long term (current) drug therapy: Secondary | ICD-10-CM | POA: Diagnosis not present

## 2021-07-20 DIAGNOSIS — I700 Atherosclerosis of aorta: Secondary | ICD-10-CM | POA: Diagnosis not present

## 2021-07-20 DIAGNOSIS — R911 Solitary pulmonary nodule: Secondary | ICD-10-CM | POA: Diagnosis not present

## 2021-07-20 DIAGNOSIS — Z85528 Personal history of other malignant neoplasm of kidney: Secondary | ICD-10-CM | POA: Diagnosis not present

## 2021-07-20 DIAGNOSIS — Z923 Personal history of irradiation: Secondary | ICD-10-CM | POA: Insufficient documentation

## 2021-07-20 DIAGNOSIS — Z7982 Long term (current) use of aspirin: Secondary | ICD-10-CM | POA: Diagnosis not present

## 2021-07-20 DIAGNOSIS — Z905 Acquired absence of kidney: Secondary | ICD-10-CM | POA: Diagnosis not present

## 2021-07-20 DIAGNOSIS — C642 Malignant neoplasm of left kidney, except renal pelvis: Secondary | ICD-10-CM | POA: Insufficient documentation

## 2021-07-20 DIAGNOSIS — R918 Other nonspecific abnormal finding of lung field: Secondary | ICD-10-CM | POA: Insufficient documentation

## 2021-07-20 DIAGNOSIS — D3501 Benign neoplasm of right adrenal gland: Secondary | ICD-10-CM | POA: Diagnosis not present

## 2021-07-20 DIAGNOSIS — R634 Abnormal weight loss: Secondary | ICD-10-CM | POA: Diagnosis not present

## 2021-07-20 LAB — CBC WITH DIFFERENTIAL (CANCER CENTER ONLY)
Abs Immature Granulocytes: 0.02 10*3/uL (ref 0.00–0.07)
Basophils Absolute: 0.1 10*3/uL (ref 0.0–0.1)
Basophils Relative: 1 %
Eosinophils Absolute: 0.4 10*3/uL (ref 0.0–0.5)
Eosinophils Relative: 5 %
HCT: 38.9 % — ABNORMAL LOW (ref 39.0–52.0)
Hemoglobin: 12.9 g/dL — ABNORMAL LOW (ref 13.0–17.0)
Immature Granulocytes: 0 %
Lymphocytes Relative: 25 %
Lymphs Abs: 1.7 10*3/uL (ref 0.7–4.0)
MCH: 30.1 pg (ref 26.0–34.0)
MCHC: 33.2 g/dL (ref 30.0–36.0)
MCV: 90.9 fL (ref 80.0–100.0)
Monocytes Absolute: 0.5 10*3/uL (ref 0.1–1.0)
Monocytes Relative: 8 %
Neutro Abs: 4 10*3/uL (ref 1.7–7.7)
Neutrophils Relative %: 61 %
Platelet Count: 161 10*3/uL (ref 150–400)
RBC: 4.28 MIL/uL (ref 4.22–5.81)
RDW: 14 % (ref 11.5–15.5)
WBC Count: 6.7 10*3/uL (ref 4.0–10.5)
nRBC: 0 % (ref 0.0–0.2)

## 2021-07-20 LAB — CMP (CANCER CENTER ONLY)
ALT: 32 U/L (ref 0–44)
AST: 21 U/L (ref 15–41)
Albumin: 3.9 g/dL (ref 3.5–5.0)
Alkaline Phosphatase: 104 U/L (ref 38–126)
Anion gap: 9 (ref 5–15)
BUN: 17 mg/dL (ref 8–23)
CO2: 24 mmol/L (ref 22–32)
Calcium: 9.6 mg/dL (ref 8.9–10.3)
Chloride: 107 mmol/L (ref 98–111)
Creatinine: 1.19 mg/dL (ref 0.61–1.24)
GFR, Estimated: 58 mL/min — ABNORMAL LOW (ref >60)
Glucose, Bld: 108 mg/dL — ABNORMAL HIGH (ref 70–99)
Potassium: 4.4 mmol/L (ref 3.5–5.1)
Sodium: 140 mmol/L (ref 135–145)
Total Bilirubin: 0.7 mg/dL (ref 0.3–1.2)
Total Protein: 7.2 g/dL (ref 6.5–8.1)

## 2021-07-27 ENCOUNTER — Inpatient Hospital Stay (HOSPITAL_BASED_OUTPATIENT_CLINIC_OR_DEPARTMENT_OTHER): Payer: Medicare Other | Admitting: Oncology

## 2021-07-27 ENCOUNTER — Other Ambulatory Visit: Payer: Self-pay

## 2021-07-27 VITALS — BP 137/62 | HR 68 | Temp 98.3°F | Resp 18 | Wt 145.4 lb

## 2021-07-27 DIAGNOSIS — Z7982 Long term (current) use of aspirin: Secondary | ICD-10-CM | POA: Diagnosis not present

## 2021-07-27 DIAGNOSIS — Z79899 Other long term (current) drug therapy: Secondary | ICD-10-CM | POA: Diagnosis not present

## 2021-07-27 DIAGNOSIS — Z923 Personal history of irradiation: Secondary | ICD-10-CM | POA: Diagnosis not present

## 2021-07-27 DIAGNOSIS — Z905 Acquired absence of kidney: Secondary | ICD-10-CM | POA: Diagnosis not present

## 2021-07-27 DIAGNOSIS — C642 Malignant neoplasm of left kidney, except renal pelvis: Secondary | ICD-10-CM | POA: Diagnosis not present

## 2021-07-27 DIAGNOSIS — R918 Other nonspecific abnormal finding of lung field: Secondary | ICD-10-CM | POA: Diagnosis not present

## 2021-07-27 DIAGNOSIS — Z85528 Personal history of other malignant neoplasm of kidney: Secondary | ICD-10-CM | POA: Diagnosis not present

## 2021-07-27 NOTE — Progress Notes (Signed)
Hematology and Oncology Follow Up Visit  Donald Todd [id number] 1929/04/27 87 y.o. 07/27/2021 3:13 PM [name], MDRoss, Dwyane Luo, MD   Principle Diagnosis: 87 year old man with stage IV clear-cell renal cell carcinoma with a documented left psoas muscle mass.   Prior Therapy:  He is status post partial nephrectomy completed on May 03, 2001.  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  He remained disease-free until recently.    On February 11, 2021 he presented with abdominal distention and underwent CT scan of the abdomen and pelvis which showed a 4.8 x 2.8 cm mass in the upper left psoas muscle.    He is status post radiation therapy to the left psoas mass completed on Apr 23, 2021.  He received 50 Gray in 5 fractions.  Current therapy: Active surveillance.  Interim History: Donald Todd is here for a follow-up evaluation.  Since the last visit, he reports no major changes in his health.  He has reported no chest pain or flank pain.  He denies any diarrhea or hematuria.  He does report abdominal discomfort at times from his surgery site but no other issues noted.     Medications: Updated on review. Current Outpatient Medications  Medication Sig Dispense Refill   metoprolol succinate (TOPROL-XL) 25 MG 24 hr tablet TAKE 1 TABLET DAILY 90 tablet 1   acetaminophen (TYLENOL) 500 MG tablet Take 1,000 mg by mouth every 6 (six) hours as needed for moderate pain.     amLODipine (NORVASC) 10 MG tablet TAKE 1 TABLET(10 MG) BY MOUTH DAILY 90 tablet 3   amoxicillin (AMOXIL) 500 MG tablet Take 2,000 mg by mouth See admin instructions. Take 2000 mg 1 hour prior to dental work     aspirin 81 MG tablet Take 81 mg by mouth at bedtime.     atorvastatin (LIPITOR) 40 MG tablet TAKE 1 TABLET  AT BEDTIME 90 tablet 3   calcium carbonate (TUMS - DOSED IN MG ELEMENTAL CALCIUM) 500 MG chewable tablet Chew 1,000 mg by mouth daily as needed for indigestion or heartburn.     docusate sodium (COLACE) 100 MG capsule Take 1 capsule (100 mg total) by mouth every 12 (twelve) hours. 60 capsule 0   enalapril (VASOTEC) 10 MG tablet TAKE 1 TABLET DAILY (Patient taking differently: Take 10 mg by mouth at bedtime.) 90 tablet 3   ipratropium (ATROVENT) 0.03 % nasal spray Place 1-2 sprays into both nostrils 2 (two) times daily as needed (allergies).      Multiple Vitamins-Minerals (PRESERVISION AREDS 2) CAPS Take 1 capsule by mouth 2 (two) times daily.     nitroGLYCERIN (NITROSTAT) 0.4 MG SL tablet Place 1 tablet (0.4 mg total) under the tongue as needed. 25 tablet 3   ofloxacin (OCUFLOX) 0.3 % ophthalmic solution Place 1 drop into the right eye See admin instructions. Instill 1 drop into the right eye 4 times daily 3 days before and 3 days after eye injections     polyethylene glycol (MIRALAX / GLYCOLAX) 17 g packet Take 17 g by mouth daily. 14 each 2   No current facility-administered medications for this visit.     Allergies:  Allergies  Allergen Reactions   Tape Hives    Surgical tape - causes blisters   Hydrocodone  Shaking uncontrollably    Iohexol      Desc: PT DOES RECALL REACTION JUST SAYS IT WAS A LONG TIME AGO FOR KIDNEY X-RAYS AND HE DIDN'T TOLERATE IT WELL-ARS 05/02/09-NOTE E-CHART STATES A HOT FEELING IS HIS REACTION, BUT HE CAN'T CONFIRM THAT WAS ALL    Oxycodone Other (See Comments)    Shaking uncontrollably       Physical Exam: Blood pressure 137/62, pulse 68, temperature 98.3 F (36.8 C), temperature source Oral, resp. rate 18, weight 145 lb 7 oz (66 kg), SpO2 99 %.  ECOG: 1   General appearance: Alert, awake without any distress. Head: Atraumatic without abnormalities Oropharynx: Without any thrush or ulcers. Eyes: No scleral icterus. Lymph nodes: No  lymphadenopathy noted in the cervical, supraclavicular, or axillary nodes Heart:regular rate and rhythm, without any murmurs or gallops.   Lung: Clear to auscultation without any rhonchi, wheezes or dullness to percussion. Abdomin: Soft, nontender without any shifting dullness or ascites. Musculoskeletal: No clubbing or cyanosis. Neurological: No motor or sensory deficits. Skin: No rashes or lesions.      Lab Results: Lab Results  Component Value Date   WBC 6.7 07/20/2021   HGB 12.9 (L) 07/20/2021   HCT 38.9 (L) 07/20/2021   MCV 90.9 07/20/2021   PLT 161 07/20/2021     Chemistry      Component Value Date/Time   NA 140 07/20/2021 1307   K 4.4 07/20/2021 1307   CL 107 07/20/2021 1307   CO2 24 07/20/2021 1307   BUN 17 07/20/2021 1307   CREATININE 1.19 07/20/2021 1307      Component Value Date/Time   CALCIUM 9.6 07/20/2021 1307   ALKPHOS 104 07/20/2021 1307   AST 21 07/20/2021 1307   ALT 32 07/20/2021 1307   BILITOT 0.7 07/20/2021 1307     Study Result  Narrative & Impression  CLINICAL DATA:  Weight loss. History of left nephrectomy for renal cell carcinoma and history of prostate cancer.   EXAM: CT CHEST, ABDOMEN AND PELVIS WITHOUT CONTRAST   TECHNIQUE: Multidetector CT imaging of the chest, abdomen and pelvis was performed following the standard protocol without IV contrast.   COMPARISON:  Chest CT 03/22/2021.  CT abdomen/pelvis 02/11/2021.   FINDINGS: CT CHEST FINDINGS   Cardiovascular: The heart is within normal limits in size for the patient's age and given the significant pectus deformity. Stable aortic and coronary artery calcifications. No pericardial effusion.   Mediastinum/Nodes: No mediastinal or hilar mass or lymphadenopathy. The esophagus is grossly normal.   Lungs/Pleura: Mild chronic bronchitic type lung changes but no infiltrates, edema or effusions.   Stable bilateral pulmonary nodules since the prior chest CT in April. The largest  nodule measures 9 mm in the right middle lobe.   Stable 3.5 mm nodule with a small adjacent nodule image number 75/4.   Stable 3 mm nodule in the right upper lobe posteriorly on image 57/4.   No new nodules. Small calcified granuloma noted in the left lower lobe.   Musculoskeletal: Age related degenerative changes. No lytic destructive or sclerotic bone lesions. Left total shoulder arthroplasty noted. Severe degenerative changes involving the right shoulder.   CT ABDOMEN PELVIS FINDINGS   Hepatobiliary: No hepatic lesions are identified without contrast. No intrahepatic biliary dilatation. The gallbladder is surgically absent. No common bile duct dilatation.   Pancreas: Age related pancreatic atrophy but no mass or inflammation. No ductal dilatation.   Spleen: Normal size.  No focal lesions.   Adrenals/Urinary Tract: Stable low-attenuation  right adrenal gland nodule consistent with benign adenoma. The left adrenal gland and left kidney are surgically absent. The right kidney is unremarkable. The left psoas mass is slightly smaller measuring 3.9 x 2.6 cm (previous 4.3 x 2.8 cm). The bladder is grossly normal.   Stomach/Bowel: The stomach, duodenum, small bowel and colon are unremarkable. No acute inflammatory changes, mass lesions or obstructive findings. Colonic diverticulosis without findings for acute diverticulitis.   Vascular/Lymphatic: Stable vascular calcifications and moderate tortuosity of the abdominal aorta but no aneurysm. No mesenteric or retroperitoneal mass or lymphadenopathy. No pelvic adenopathy.   Reproductive: Brachytherapy seeds are again noted throughout the prostate gland. The seminal vesicles are grossly normal. Left inguinal hernia noted with the left testicle in the lower left inguinal canal. No inguinal adenopathy.   Other: No free pelvic fluid collections.  No abdominal wall hernias.   Musculoskeletal: Stable advanced degenerative lumbar  spondylosis and thoracolumbar scoliosis. No destructive lytic bone lesions or sclerotic bone lesions are identified.   IMPRESSION: 1. Stable bilateral pulmonary nodules. No new or progressive findings. 2. No mediastinal or hilar mass or adenopathy. 3. Status post left nephrectomy and adrenalectomy. 4. Slight interval decrease in size of the left psoas mass. 5. No findings for metastatic disease involving the solid abdominal organs and adenopathy. 6. Stable right adrenal gland adenoma. 7. Brachytherapy seeds in the prostate gland but no findings suspicious for osseous metastatic disease. 8. Aortic atherosclerosis.   Aortic Atherosclerosis (ICD10-I70.0).       Impression and Plan:  87 year old man with:   1.  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    CT scan obtained on July 20, 2021 was personally reviewed and showed no evidence of progression of disease at this time. His left psoas muscle mass showed decrease in size after radiation therapy.  Treatment options moving forward were reviewed which include systemic therapy versus continued active surveillance.  Systemic therapy would include immunotherapy versus oral targeted therapy as well as combination of the above.  After discussion today, I have opted to continue with active surveillance at this time and reinstitute systemic therapy if he has had symptomatic progression.  He is agreeable with this plan.   2.  Follow-up: In 6 months for repeat evaluation and imaging studies.   30  minutes were dedicated to this visit.  Time spent on reviewing imaging studies, disease status update, treatment choices and future plan of care discussion.  Zola Button, MD 8/23/20223:13 PM

## 2021-07-29 DIAGNOSIS — Z961 Presence of intraocular lens: Secondary | ICD-10-CM | POA: Diagnosis not present

## 2021-07-29 DIAGNOSIS — H353222 Exudative age-related macular degeneration, left eye, with inactive choroidal neovascularization: Secondary | ICD-10-CM | POA: Diagnosis not present

## 2021-07-29 DIAGNOSIS — H353211 Exudative age-related macular degeneration, right eye, with active choroidal neovascularization: Secondary | ICD-10-CM | POA: Diagnosis not present

## 2021-08-16 ENCOUNTER — Other Ambulatory Visit: Payer: Self-pay | Admitting: Cardiovascular Disease

## 2021-08-27 DIAGNOSIS — Z961 Presence of intraocular lens: Secondary | ICD-10-CM | POA: Diagnosis not present

## 2021-08-27 DIAGNOSIS — H353222 Exudative age-related macular degeneration, left eye, with inactive choroidal neovascularization: Secondary | ICD-10-CM | POA: Diagnosis not present

## 2021-08-27 DIAGNOSIS — H353211 Exudative age-related macular degeneration, right eye, with active choroidal neovascularization: Secondary | ICD-10-CM | POA: Diagnosis not present

## 2021-09-09 DIAGNOSIS — H43811 Vitreous degeneration, right eye: Secondary | ICD-10-CM | POA: Diagnosis not present

## 2021-09-09 DIAGNOSIS — Z961 Presence of intraocular lens: Secondary | ICD-10-CM | POA: Diagnosis not present

## 2021-09-09 DIAGNOSIS — H353211 Exudative age-related macular degeneration, right eye, with active choroidal neovascularization: Secondary | ICD-10-CM | POA: Diagnosis not present

## 2021-10-14 DIAGNOSIS — Z20822 Contact with and (suspected) exposure to covid-19: Secondary | ICD-10-CM | POA: Diagnosis not present

## 2021-10-15 ENCOUNTER — Other Ambulatory Visit: Payer: Self-pay | Admitting: *Deleted

## 2021-10-15 MED ORDER — AMLODIPINE BESYLATE 10 MG PO TABS: 10.0000 mg | ORAL_TABLET | Freq: Every day | ORAL | 2 refills | Status: DC

## 2021-11-12 DIAGNOSIS — H353222 Exudative age-related macular degeneration, left eye, with inactive choroidal neovascularization: Secondary | ICD-10-CM | POA: Diagnosis not present

## 2021-11-12 DIAGNOSIS — H353211 Exudative age-related macular degeneration, right eye, with active choroidal neovascularization: Secondary | ICD-10-CM | POA: Diagnosis not present

## 2021-11-12 DIAGNOSIS — Z961 Presence of intraocular lens: Secondary | ICD-10-CM | POA: Diagnosis not present

## 2021-11-15 DIAGNOSIS — I10 Essential (primary) hypertension: Secondary | ICD-10-CM | POA: Diagnosis not present

## 2021-11-15 DIAGNOSIS — R7301 Impaired fasting glucose: Secondary | ICD-10-CM | POA: Diagnosis not present

## 2021-11-15 DIAGNOSIS — I2510 Atherosclerotic heart disease of native coronary artery without angina pectoris: Secondary | ICD-10-CM | POA: Diagnosis not present

## 2021-11-18 DIAGNOSIS — Z0000 Encounter for general adult medical examination without abnormal findings: Secondary | ICD-10-CM | POA: Diagnosis not present

## 2021-11-18 DIAGNOSIS — R109 Unspecified abdominal pain: Secondary | ICD-10-CM | POA: Diagnosis not present

## 2021-11-18 DIAGNOSIS — E7800 Pure hypercholesterolemia, unspecified: Secondary | ICD-10-CM | POA: Diagnosis not present

## 2021-11-18 DIAGNOSIS — N183 Chronic kidney disease, stage 3 unspecified: Secondary | ICD-10-CM | POA: Diagnosis not present

## 2021-11-18 DIAGNOSIS — I868 Varicose veins of other specified sites: Secondary | ICD-10-CM | POA: Diagnosis not present

## 2021-11-18 DIAGNOSIS — I10 Essential (primary) hypertension: Secondary | ICD-10-CM | POA: Diagnosis not present

## 2021-11-18 DIAGNOSIS — R7301 Impaired fasting glucose: Secondary | ICD-10-CM | POA: Diagnosis not present

## 2021-11-18 DIAGNOSIS — R609 Edema, unspecified: Secondary | ICD-10-CM | POA: Diagnosis not present

## 2021-11-24 DIAGNOSIS — K5909 Other constipation: Secondary | ICD-10-CM | POA: Diagnosis not present

## 2021-11-24 DIAGNOSIS — R1032 Left lower quadrant pain: Secondary | ICD-10-CM | POA: Diagnosis not present

## 2021-11-24 DIAGNOSIS — C642 Malignant neoplasm of left kidney, except renal pelvis: Secondary | ICD-10-CM | POA: Diagnosis not present

## 2021-11-25 DIAGNOSIS — C642 Malignant neoplasm of left kidney, except renal pelvis: Secondary | ICD-10-CM | POA: Diagnosis not present

## 2021-12-27 DIAGNOSIS — R1032 Left lower quadrant pain: Secondary | ICD-10-CM | POA: Diagnosis not present

## 2021-12-27 DIAGNOSIS — K5900 Constipation, unspecified: Secondary | ICD-10-CM | POA: Diagnosis not present

## 2021-12-28 ENCOUNTER — Encounter: Payer: Self-pay | Admitting: Cardiovascular Disease

## 2021-12-28 ENCOUNTER — Other Ambulatory Visit: Payer: Self-pay

## 2021-12-28 ENCOUNTER — Ambulatory Visit (INDEPENDENT_AMBULATORY_CARE_PROVIDER_SITE_OTHER): Payer: Medicare Other | Admitting: Cardiovascular Disease

## 2021-12-28 VITALS — BP 130/70 | HR 69 | Ht 66.0 in | Wt 144.4 lb

## 2021-12-28 DIAGNOSIS — E785 Hyperlipidemia, unspecified: Secondary | ICD-10-CM

## 2021-12-28 DIAGNOSIS — I10 Essential (primary) hypertension: Secondary | ICD-10-CM | POA: Diagnosis not present

## 2021-12-28 DIAGNOSIS — Z9861 Coronary angioplasty status: Secondary | ICD-10-CM | POA: Diagnosis not present

## 2021-12-28 DIAGNOSIS — I2510 Atherosclerotic heart disease of native coronary artery without angina pectoris: Secondary | ICD-10-CM

## 2021-12-28 NOTE — Patient Instructions (Signed)

## 2021-12-28 NOTE — Assessment & Plan Note (Signed)
History of CAD status post RCA and ramus branch stenting by myself back in 1997.  I catheterized him 1/17/005 revealing patent stents with 60% mid LAD stenosis and normal LV function.  At that time he did have a 40% ostial right renal artery stenosis and has had a left nephrectomy.  He does get occasional atypical chest pain which has not changed in frequency or severity.  He had a Myoview stress test performed 05/07/2020 that showed findings consistent with prior infarct unchanged from his last Myoview in 2014.  His LV function was preserved.

## 2021-12-28 NOTE — Progress Notes (Signed)
12/28/2021 Donald Todd   August 16, 1929  [id number]  Primary Physician [name], MD Primary Cardiologist: Lorretta Harp MD FACP, [place], [place], [place]  HPI:  Donald Todd is a 88 y.o.  thin-appearing married Caucasian male, father of 33, grandfather to 7 grandchildren, who I last  saw in the office 06/26/2020.  He has a history of RCA and ramus branch stenting by myself back in 1997. I catheterized him, December 22, 2003, revealing patent stent with a 60% mid LAD lesion, normal LV function. He did have a 40% ostial right renal artery stenosis with a known left nephrectomy. His last Myoview, performed August 04, 2010, revealed lateral scar, and catheterization performed several weeks prior to that revealed patent stents with noncritical CAD. He has remained asymptomatic. His last .Myoview stress test performed 02/15/13 moderate sized lateral wall scar consistent with his anatomy. He saw [name] in the office 04/03/17 after a syncopal episode that occurred while he was bending over and running up. He's had a workup including an MRI of his brain which is unremarkable. He did injure his left shoulder and had this surgically addressed.   Because of some atypical chest pain I perform Myoview stress testing on him 05/07/2020 that showed lateral scar without ischemia unchanged from prior Myoview stress test performed in 2014.[name]  His pain is somewhat atypical.   Since I saw him a year ago he still gets atypical chest pain on occasion which has not changed in frequency or severity.  Unfortunately, he was diagnosed with cancer again.  He originally was diagnosed with left renal cell cancer in early 2000's status post left nephrectomy in 2005.  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  He has had radiation therapy.     Current Meds  Medication Sig   acetaminophen (TYLENOL) 500 MG tablet Take 1,000 mg by mouth every 6 (six) hours as  needed for moderate pain.   amLODipine (NORVASC) 10 MG tablet Take 1 tablet (10 mg total) by mouth daily.   aspirin 81 MG tablet Take 81 mg by mouth at bedtime.   Docusate Sodium 100 MG capsule 1 tablet as needed   enalapril (VASOTEC) 10 MG tablet TAKE 1 TABLET DAILY   loratadine (CLARITIN) 10 MG tablet Take 10 mg by mouth daily.   meclizine (ANTIVERT) 25 MG tablet 1 tablet as needed   metoprolol succinate (TOPROL-XL) 25 MG 24 hr tablet TAKE 1 TABLET DAILY   Multiple Vitamins-Minerals (PRESERVISION AREDS 2) CAPS Take 1 capsule by mouth 2 (two) times daily.   nitroGLYCERIN (NITROSTAT) 0.4 MG SL tablet Place 1 tablet (0.4 mg total) under the tongue as needed.   polyethylene glycol (MIRALAX / GLYCOLAX) 17 g packet Take 17 g by mouth daily.   psyllium (METAMUCIL) 0.52 g capsule 2 capsules with 8 ounces of liquid     Allergies  Allergen Reactions   Tape Hives    Surgical tape - causes blisters   Hydrocodone     Shaking uncontrollably    Iohexol      Desc: PT DOES RECALL REACTION JUST SAYS IT WAS A LONG TIME AGO FOR KIDNEY X-RAYS AND HE DIDN'T TOLERATE IT WELL-ARS 05/02/09-NOTE E-CHART STATES A HOT FEELING IS HIS REACTION, BUT HE CAN'T CONFIRM THAT WAS ALL    Oxycodone Other (See Comments)    Shaking uncontrollably     Social History   Socioeconomic  History   Marital status: Married    Spouse name: Not on file   Number of children: 5   Years of education: 12   Highest education level: Not on file  Occupational History   Occupation: Retired    Comment: Firefighter, Location manager office  Tobacco Use   Smoking status: Former    Packs/day: 0.25    Years: 13.00    Pack years: 3.25    Types: Cigarettes, Cigars    Quit date: 12/14/1963    Years since quitting: 58.0   Smokeless tobacco: Never  Vaping Use   Vaping Use: Never used  Substance and Sexual Activity   Alcohol use: No   Drug use: No   Sexual activity: Not Currently  Other Topics Concern   Not on file  Social History Narrative    Lives at home w/ his wife   Right-handed   Caffeine: 2 cups of coffee per day, rare soft drink   Social Determinants of Health   Financial Resource Strain: Not on file  Food Insecurity: Not on file  Transportation Needs: Not on file  Physical Activity: Not on file  Stress: Not on file  Social Connections: Not on file  Intimate Partner Violence: Not on file     Review of Systems: General: negative for chills, fever, night sweats or weight changes.  Cardiovascular: negative for chest pain, dyspnea on exertion, edema, orthopnea, palpitations, paroxysmal nocturnal dyspnea or shortness of breath Dermatological: negative for rash Respiratory: negative for cough or wheezing Urologic: negative for hematuria Abdominal: negative for nausea, vomiting, diarrhea, bright red blood per rectum, melena, or hematemesis Neurologic: negative for visual changes, syncope, or dizziness All other systems reviewed and are otherwise negative except as noted above.    Blood pressure 130/70, pulse 69, height 5\' 6"  (1.676 m), weight 144 lb 6.4 oz (65.5 kg), SpO2 98 %.  General appearance: alert and no distress Neck: no adenopathy, no carotid bruit, no JVD, supple, symmetrical, trachea midline, and thyroid not enlarged, symmetric, no tenderness/mass/nodules Lungs: clear to auscultation bilaterally Heart: regular rate and rhythm, S1, S2 normal, no murmur, click, rub or gallop Extremities: extremities normal, atraumatic, no cyanosis or edema Pulses: 2+ and symmetric Skin: Skin color, texture, turgor normal. No rashes or lesions Neurologic: Grossly normal  EKG sinus rhythm at 69 with incomplete right bundle branch block, and left anterior fascicular block with poor R wave progression.  I personally reviewed this EKG.  ASSESSMENT AND PLAN:   Essential hypertension History of essential hypertension a blood pressure measured today at 130/70.  He is on amlodipine, enalapril and metoprolol.  CAD S/P  percutaneous coronary angioplasty History of CAD status post RCA and ramus branch stenting by myself back in 1997.  I catheterized him 1/17/005 revealing patent stents with 60% mid LAD stenosis and normal LV function.  At that time he did have a 40% ostial right renal artery stenosis and has had a left nephrectomy.  He does get occasional atypical chest pain which has not changed in frequency or severity.  He had a Myoview stress test performed 05/07/2020 that showed findings consistent with prior infarct unchanged from his last Myoview in 2014.  His LV function was preserved.  Dyslipidemia History of dyslipidemia on statin therapy with lipid profile performed 11/15/2021 revealing a total cholesterol 145, LDL 57 and HDL 72.     Lorretta Harp MD [facility], [facility] 12/28/2021 3:10 PM

## 2021-12-28 NOTE — Assessment & Plan Note (Signed)
History of dyslipidemia on statin therapy with lipid profile performed 11/15/2021 revealing a total cholesterol 145, LDL 57 and HDL 72.

## 2021-12-28 NOTE — Assessment & Plan Note (Signed)
History of essential hypertension a blood pressure measured today at 130/70.  He is on amlodipine, enalapril and metoprolol.

## 2022-01-06 ENCOUNTER — Other Ambulatory Visit: Payer: Self-pay | Admitting: Cardiovascular Disease

## 2022-01-07 DIAGNOSIS — Z961 Presence of intraocular lens: Secondary | ICD-10-CM | POA: Diagnosis not present

## 2022-01-07 DIAGNOSIS — H353222 Exudative age-related macular degeneration, left eye, with inactive choroidal neovascularization: Secondary | ICD-10-CM | POA: Diagnosis not present

## 2022-01-07 DIAGNOSIS — H353211 Exudative age-related macular degeneration, right eye, with active choroidal neovascularization: Secondary | ICD-10-CM | POA: Diagnosis not present

## 2022-01-13 ENCOUNTER — Telehealth: Payer: Self-pay | Admitting: Oncology

## 2022-01-13 ENCOUNTER — Telehealth: Payer: Self-pay | Admitting: Cardiovascular Disease

## 2022-01-13 NOTE — Telephone Encounter (Signed)
Spoke with patient who stated that years ago, Dr. Gwenlyn Found told him no to eat shrimp. Patient states a friend brought him soup with shellfish in it and wanted to know if he should eat it. Recommended that he not eat the soup, and if he wants to verify an allergy to shellfish, to schedule an appointment with an allergist. Patient voiced understanding of this conversation.

## 2022-01-13 NOTE — Telephone Encounter (Signed)
Called patient regarding upcoming February appointments, patient has been called and notified. 

## 2022-01-13 NOTE — Telephone Encounter (Signed)
New Message:      Patient says years ago Dr Gwenlyn Found told him he could not eat Shrimp. He wants to know if he can eat any type of Shell Fish?

## 2022-01-18 DIAGNOSIS — R051 Acute cough: Secondary | ICD-10-CM | POA: Diagnosis not present

## 2022-01-18 DIAGNOSIS — Z03818 Encounter for observation for suspected exposure to other biological agents ruled out: Secondary | ICD-10-CM | POA: Diagnosis not present

## 2022-01-20 ENCOUNTER — Ambulatory Visit (HOSPITAL_COMMUNITY)
Admission: RE | Admit: 2022-01-20 | Discharge: 2022-01-20 | Disposition: A | Discharge status: Home / Self Care | Payer: Medicare Other | Source: Ambulatory Visit | Attending: Oncology | Admitting: Oncology

## 2022-01-20 ENCOUNTER — Inpatient Hospital Stay: Payer: Medicare Other | Attending: Oncology

## 2022-01-20 ENCOUNTER — Other Ambulatory Visit: Payer: Self-pay

## 2022-01-20 DIAGNOSIS — C7989 Secondary malignant neoplasm of other specified sites: Secondary | ICD-10-CM | POA: Insufficient documentation

## 2022-01-20 DIAGNOSIS — C642 Malignant neoplasm of left kidney, except renal pelvis: Secondary | ICD-10-CM | POA: Diagnosis not present

## 2022-01-20 DIAGNOSIS — C801 Malignant (primary) neoplasm, unspecified: Secondary | ICD-10-CM | POA: Diagnosis not present

## 2022-01-20 DIAGNOSIS — K5730 Diverticulosis of large intestine without perforation or abscess without bleeding: Secondary | ICD-10-CM | POA: Diagnosis not present

## 2022-01-20 DIAGNOSIS — I700 Atherosclerosis of aorta: Secondary | ICD-10-CM | POA: Insufficient documentation

## 2022-01-20 DIAGNOSIS — C7900 Secondary malignant neoplasm of unspecified kidney and renal pelvis: Secondary | ICD-10-CM | POA: Diagnosis not present

## 2022-01-20 DIAGNOSIS — R911 Solitary pulmonary nodule: Secondary | ICD-10-CM | POA: Diagnosis not present

## 2022-01-20 LAB — CMP (CANCER CENTER ONLY)
ALT: 19 U/L (ref 0–44)
AST: 20 U/L (ref 15–41)
Albumin: 4.1 g/dL (ref 3.5–5.0)
Alkaline Phosphatase: 114 U/L (ref 38–126)
Anion gap: 9 (ref 5–15)
BUN: 26 mg/dL — ABNORMAL HIGH (ref 8–23)
CO2: 26 mmol/L (ref 22–32)
Calcium: 9.6 mg/dL (ref 8.9–10.3)
Chloride: 105 mmol/L (ref 98–111)
Creatinine: 1.41 mg/dL — ABNORMAL HIGH (ref 0.61–1.24)
GFR, Estimated: 47 mL/min — ABNORMAL LOW (ref >60)
Glucose, Bld: 117 mg/dL — ABNORMAL HIGH (ref 70–99)
Potassium: 4.4 mmol/L (ref 3.5–5.1)
Sodium: 140 mmol/L (ref 135–145)
Total Bilirubin: 0.5 mg/dL (ref 0.3–1.2)
Total Protein: 7.3 g/dL (ref 6.5–8.1)

## 2022-01-20 LAB — CBC WITH DIFFERENTIAL (CANCER CENTER ONLY)
Abs Immature Granulocytes: 0.02 10*3/uL (ref 0.00–0.07)
Basophils Absolute: 0.1 10*3/uL (ref 0.0–0.1)
Basophils Relative: 1 %
Eosinophils Absolute: 0.2 10*3/uL (ref 0.0–0.5)
Eosinophils Relative: 2 %
HCT: 40.5 % (ref 39.0–52.0)
Hemoglobin: 13.1 g/dL (ref 13.0–17.0)
Immature Granulocytes: 0 %
Lymphocytes Relative: 19 %
Lymphs Abs: 1.5 10*3/uL (ref 0.7–4.0)
MCH: 29.7 pg (ref 26.0–34.0)
MCHC: 32.3 g/dL (ref 30.0–36.0)
MCV: 91.8 fL (ref 80.0–100.0)
Monocytes Absolute: 0.5 10*3/uL (ref 0.1–1.0)
Monocytes Relative: 7 %
Neutro Abs: 5.7 10*3/uL (ref 1.7–7.7)
Neutrophils Relative %: 71 %
Platelet Count: 178 10*3/uL (ref 150–400)
RBC: 4.41 MIL/uL (ref 4.22–5.81)
RDW: 13.7 % (ref 11.5–15.5)
WBC Count: 8 10*3/uL (ref 4.0–10.5)
nRBC: 0 % (ref 0.0–0.2)

## 2022-01-27 ENCOUNTER — Inpatient Hospital Stay (HOSPITAL_BASED_OUTPATIENT_CLINIC_OR_DEPARTMENT_OTHER): Payer: Medicare Other | Admitting: Oncology

## 2022-01-27 ENCOUNTER — Other Ambulatory Visit: Payer: Self-pay

## 2022-01-27 VITALS — BP 143/63 | HR 67 | Temp 98.2°F | Resp 18 | Wt 144.8 lb

## 2022-01-27 DIAGNOSIS — C642 Malignant neoplasm of left kidney, except renal pelvis: Secondary | ICD-10-CM

## 2022-01-27 DIAGNOSIS — C7989 Secondary malignant neoplasm of other specified sites: Secondary | ICD-10-CM | POA: Insufficient documentation

## 2022-01-27 DIAGNOSIS — I700 Atherosclerosis of aorta: Secondary | ICD-10-CM | POA: Insufficient documentation

## 2022-01-27 NOTE — Progress Notes (Signed)
Hematology and Oncology Follow Up Visit  Donald Todd [id number] [birth year] 88 y.o. 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  He has low risk disease.   Prior Therapy:  He is status post partial nephrectomy completed on May 03, 2001.  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  He remained disease-free until recently.    On February 11, 2021 he presented with abdominal distention and underwent CT scan of the abdomen and pelvis which showed a 4.8 x 2.8 cm mass in the upper left psoas muscle.    He is status post radiation therapy to the left psoas mass completed on Apr 23, 2021.  He received 50 Gray in 5 fractions.  Current therapy: Active surveillance.  Interim History: Mr. Donald Todd returns today for a follow-up visit.  Since last visit, he reports no major changes in his health.  He denies any excessive fatigue, tiredness or weakness.  He denied any shortness of breath or difficulty breathing.  He denies any syncope.  His performance status quality of life remained stable.     Medications: Reviewed without changes. Current Outpatient Medications  Medication Sig Dispense Refill   acetaminophen (TYLENOL) 500 MG tablet Take 1,000 mg by mouth every 6 (six) hours as needed for moderate pain.     amLODipine (NORVASC) 10 MG tablet Take 1 tablet (10 mg total) by mouth daily. 90 tablet 2   amoxicillin (AMOXIL) 500 MG tablet Take 2,000 mg by mouth See admin instructions. Take 2000 mg 1 hour prior to dental work (Patient not taking: Reported on 12/28/2021)     aspirin 81 MG  tablet Take 81 mg by mouth at bedtime.     atorvastatin (LIPITOR) 40 MG tablet TAKE 1 TABLET AT BEDTIME (Patient not taking: Reported on 12/28/2021) 90 tablet 3   calcium carbonate (TUMS - DOSED IN MG ELEMENTAL CALCIUM) 500 MG chewable tablet Chew 1,000 mg by mouth daily as needed for indigestion or heartburn. (Patient not taking: Reported on 12/28/2021)     docusate sodium (COLACE) 100 MG capsule Take 1 capsule (100 mg total) by mouth every 12 (twelve) hours. (Patient not taking: Reported on 12/28/2021) 60 capsule 0   Docusate Sodium 100 MG capsule 1 tablet as needed     enalapril (VASOTEC) 10 MG tablet TAKE 1 TABLET DAILY 90 tablet 3   ipratropium (ATROVENT) 0.03 % nasal spray Place 1-2 sprays into both nostrils 2 (two) times daily as needed (allergies).  (Patient not taking: Reported on 12/28/2021)     loratadine (CLARITIN) 10 MG tablet Take 10 mg by mouth daily.     meclizine (ANTIVERT) 25 MG tablet 1 tablet as needed     metoprolol succinate (TOPROL-XL) 25 MG 24 hr tablet TAKE 1 TABLET DAILY 90 tablet 3   Multiple Vitamins-Minerals (PRESERVISION AREDS 2) CAPS Take 1 capsule by mouth 2 (two) times daily.     nitroGLYCERIN (NITROSTAT) 0.4 MG SL tablet Place 1 tablet (0.4 mg total) under the tongue as needed. 25 tablet 3   ofloxacin (OCUFLOX) 0.3 % ophthalmic solution Place 1 drop into the right eye See admin  instructions. Instill 1 drop into the right eye 4 times daily 3 days before and 3 days after eye injections (Patient not taking: Reported on 12/28/2021)     polyethylene glycol (MIRALAX / GLYCOLAX) 17 g packet Take 17 g by mouth daily. 14 each 2   psyllium (METAMUCIL) 0.52 g capsule 2 capsules with 8 ounces of liquid     No current facility-administered medications for this visit.     Allergies:  Allergies  Allergen Reactions   Tape Hives    Surgical tape - causes blisters   Hydrocodone     Shaking uncontrollably    Iohexol      Desc: PT DOES RECALL REACTION JUST SAYS IT WAS A LONG TIME  AGO FOR KIDNEY X-RAYS AND HE DIDN'T TOLERATE IT WELL-ARS 05/02/09-NOTE E-CHART STATES A HOT FEELING IS HIS REACTION, BUT HE CAN'T CONFIRM THAT WAS ALL    Oxycodone Other (See Comments)    Shaking uncontrollably       Physical Exam: Blood pressure (!) 143/63, pulse 67, temperature 98.2 F (36.8 C), resp. rate 18, weight 144 lb 12.8 oz (65.7 kg), SpO2 99 %.   ECOG: 1    General appearance: Comfortable appearing without any discomfort Head: Normocephalic without any trauma Oropharynx: Mucous membranes are moist and pink without any thrush or ulcers. Eyes: Pupils are equal and round reactive to light. Lymph nodes: No cervical, supraclavicular, inguinal or axillary lymphadenopathy.   Heart:regular rate and rhythm.  S1 and S2 without leg edema. Lung: Clear without any rhonchi or wheezes.  No dullness to percussion. Abdomin: Soft, nontender, nondistended with good bowel sounds.  No hepatosplenomegaly. Musculoskeletal: No joint deformity or effusion.  Full range of motion noted. Neurological: No deficits noted on motor, sensory and deep tendon reflex exam. Skin: No petechial rash or dryness.  Appeared moist.        Lab Results: Lab Results  Component Value Date   WBC 8.0 01/20/2022   HGB 13.1 01/20/2022   HCT 40.5 01/20/2022   MCV 91.8 01/20/2022   PLT 178 01/20/2022     Chemistry      Component Value Date/Time   NA 140 01/20/2022 1141   K 4.4 01/20/2022 1141   CL 105 01/20/2022 1141   CO2 26 01/20/2022 1141   BUN 26 (H) 01/20/2022 1141   CREATININE 1.41 (H) 01/20/2022 1141      Component Value Date/Time   CALCIUM 9.6 01/20/2022 1141   ALKPHOS 114 01/20/2022 1141   AST 20 01/20/2022 1141   ALT 19 01/20/2022 1141   BILITOT 0.5 01/20/2022 1141     Study Result    IMPRESSION: 1. Although ill-defined on noncontrast examination, there has been interval decrease in size of a soft tissue nodule involving the superior aspect of the left psoas muscle, consistent with  treatment response of a biopsy proven metastasis. 2. Stable to slight interval enlargement of a nodule of the medial segment right middle lobe, measuring 1.1 x 0.8 cm, previously 0.9 x 0.7 cm. 3. Other smaller nodules are stable. 4. Status post left nephrectomy and adrenalectomy. 5. Prostate brachytherapy.   Aortic Atherosclerosis (ICD10-I70.0).     Impression and Plan:  88 year old man with:   1.  Kidney cancer diagnosed in 2002.  He developed stage IV clear-cell renal cell carcinoma with left iliopsoas and possible pulmonary nodules.   His disease status was updated at this time and treatment choices were discussed.  CT scan obtained on January 20, 2022 personally reviewed.  His left  psoas metastatic lesion has decreased in size and continues to have overall stable small pulmonary nodules.  Treatment options moving forward were discussed.  Given the small nature of his area of metastasis have recommended a period of observation and repeat imaging studies in 6 months.  If his pulmonary nodules grow possible treatments would be radiation therapy to the dominant lesion versus systemic therapy.  Systemic therapy options including oral targeted therapy single agent versus immunotherapy or combination of the above.  His options are deferred at this time given his low volume low risk disease.   2.  Follow-up: He will return in 6 months for repeat follow-up.   30  minutes were spent on this encounter.  Time was dedicated to reviewing laboratory data, disease status update, treatment choices and reviewing imaging studies and future plan of care discussion.  Zola Button, MD 2/23/20239:53 AM

## 2022-02-02 ENCOUNTER — Telehealth: Payer: Self-pay | Admitting: Oncology

## 2022-02-02 NOTE — Telephone Encounter (Signed)
Scheduled per 02/23 los, patient has been called and notified. ?

## 2022-02-04 DIAGNOSIS — H35362 Drusen (degenerative) of macula, left eye: Secondary | ICD-10-CM | POA: Diagnosis not present

## 2022-02-04 DIAGNOSIS — H353211 Exudative age-related macular degeneration, right eye, with active choroidal neovascularization: Secondary | ICD-10-CM | POA: Diagnosis not present

## 2022-02-04 DIAGNOSIS — H3589 Other specified retinal disorders: Secondary | ICD-10-CM | POA: Diagnosis not present

## 2022-02-04 DIAGNOSIS — Z961 Presence of intraocular lens: Secondary | ICD-10-CM | POA: Diagnosis not present

## 2022-02-04 DIAGNOSIS — H353231 Exudative age-related macular degeneration, bilateral, with active choroidal neovascularization: Secondary | ICD-10-CM | POA: Diagnosis not present

## 2022-02-04 DIAGNOSIS — H353221 Exudative age-related macular degeneration, left eye, with active choroidal neovascularization: Secondary | ICD-10-CM | POA: Diagnosis not present

## 2022-02-21 DIAGNOSIS — Z20822 Contact with and (suspected) exposure to covid-19: Secondary | ICD-10-CM | POA: Diagnosis not present

## 2022-03-10 DIAGNOSIS — Z961 Presence of intraocular lens: Secondary | ICD-10-CM | POA: Diagnosis not present

## 2022-03-10 DIAGNOSIS — H353221 Exudative age-related macular degeneration, left eye, with active choroidal neovascularization: Secondary | ICD-10-CM | POA: Diagnosis not present

## 2022-03-10 DIAGNOSIS — H353211 Exudative age-related macular degeneration, right eye, with active choroidal neovascularization: Secondary | ICD-10-CM | POA: Diagnosis not present

## 2022-03-22 DIAGNOSIS — Z20822 Contact with and (suspected) exposure to covid-19: Secondary | ICD-10-CM | POA: Diagnosis not present

## 2022-03-27 DIAGNOSIS — M9901 Segmental and somatic dysfunction of cervical region: Secondary | ICD-10-CM | POA: Diagnosis not present

## 2022-03-27 DIAGNOSIS — M9903 Segmental and somatic dysfunction of lumbar region: Secondary | ICD-10-CM | POA: Diagnosis not present

## 2022-03-27 DIAGNOSIS — M5411 Radiculopathy, occipito-atlanto-axial region: Secondary | ICD-10-CM | POA: Diagnosis not present

## 2022-03-27 DIAGNOSIS — M62830 Muscle spasm of back: Secondary | ICD-10-CM | POA: Diagnosis not present

## 2022-03-27 DIAGNOSIS — M9902 Segmental and somatic dysfunction of thoracic region: Secondary | ICD-10-CM | POA: Diagnosis not present

## 2022-03-27 DIAGNOSIS — G43101 Migraine with aura, not intractable, with status migrainosus: Secondary | ICD-10-CM | POA: Diagnosis not present

## 2022-03-30 DIAGNOSIS — M62830 Muscle spasm of back: Secondary | ICD-10-CM | POA: Diagnosis not present

## 2022-03-30 DIAGNOSIS — M9902 Segmental and somatic dysfunction of thoracic region: Secondary | ICD-10-CM | POA: Diagnosis not present

## 2022-03-30 DIAGNOSIS — M9901 Segmental and somatic dysfunction of cervical region: Secondary | ICD-10-CM | POA: Diagnosis not present

## 2022-03-30 DIAGNOSIS — M5411 Radiculopathy, occipito-atlanto-axial region: Secondary | ICD-10-CM | POA: Diagnosis not present

## 2022-03-30 DIAGNOSIS — M9903 Segmental and somatic dysfunction of lumbar region: Secondary | ICD-10-CM | POA: Diagnosis not present

## 2022-03-30 DIAGNOSIS — G43101 Migraine with aura, not intractable, with status migrainosus: Secondary | ICD-10-CM | POA: Diagnosis not present

## 2022-04-06 DIAGNOSIS — M62830 Muscle spasm of back: Secondary | ICD-10-CM | POA: Diagnosis not present

## 2022-04-06 DIAGNOSIS — M5411 Radiculopathy, occipito-atlanto-axial region: Secondary | ICD-10-CM | POA: Diagnosis not present

## 2022-04-06 DIAGNOSIS — G43101 Migraine with aura, not intractable, with status migrainosus: Secondary | ICD-10-CM | POA: Diagnosis not present

## 2022-04-06 DIAGNOSIS — M9903 Segmental and somatic dysfunction of lumbar region: Secondary | ICD-10-CM | POA: Diagnosis not present

## 2022-04-06 DIAGNOSIS — M9902 Segmental and somatic dysfunction of thoracic region: Secondary | ICD-10-CM | POA: Diagnosis not present

## 2022-04-06 DIAGNOSIS — M9901 Segmental and somatic dysfunction of cervical region: Secondary | ICD-10-CM | POA: Diagnosis not present

## 2022-04-13 ENCOUNTER — Other Ambulatory Visit: Payer: Self-pay | Admitting: Cardiovascular Disease

## 2022-04-14 DIAGNOSIS — I208 Other forms of angina pectoris: Secondary | ICD-10-CM | POA: Diagnosis not present

## 2022-04-14 DIAGNOSIS — I10 Essential (primary) hypertension: Secondary | ICD-10-CM | POA: Diagnosis not present

## 2022-04-14 DIAGNOSIS — N183 Chronic kidney disease, stage 3 unspecified: Secondary | ICD-10-CM | POA: Diagnosis not present

## 2022-04-14 DIAGNOSIS — R202 Paresthesia of skin: Secondary | ICD-10-CM | POA: Diagnosis not present

## 2022-04-14 DIAGNOSIS — D485 Neoplasm of uncertain behavior of skin: Secondary | ICD-10-CM | POA: Diagnosis not present

## 2022-04-15 DIAGNOSIS — M9902 Segmental and somatic dysfunction of thoracic region: Secondary | ICD-10-CM | POA: Diagnosis not present

## 2022-04-15 DIAGNOSIS — M9903 Segmental and somatic dysfunction of lumbar region: Secondary | ICD-10-CM | POA: Diagnosis not present

## 2022-04-15 DIAGNOSIS — M9901 Segmental and somatic dysfunction of cervical region: Secondary | ICD-10-CM | POA: Diagnosis not present

## 2022-04-15 DIAGNOSIS — G43101 Migraine with aura, not intractable, with status migrainosus: Secondary | ICD-10-CM | POA: Diagnosis not present

## 2022-04-15 DIAGNOSIS — M62830 Muscle spasm of back: Secondary | ICD-10-CM | POA: Diagnosis not present

## 2022-04-15 DIAGNOSIS — M5411 Radiculopathy, occipito-atlanto-axial region: Secondary | ICD-10-CM | POA: Diagnosis not present

## 2022-04-21 DIAGNOSIS — H353211 Exudative age-related macular degeneration, right eye, with active choroidal neovascularization: Secondary | ICD-10-CM | POA: Diagnosis not present

## 2022-04-21 DIAGNOSIS — H353221 Exudative age-related macular degeneration, left eye, with active choroidal neovascularization: Secondary | ICD-10-CM | POA: Diagnosis not present

## 2022-04-21 DIAGNOSIS — Z961 Presence of intraocular lens: Secondary | ICD-10-CM | POA: Diagnosis not present

## 2022-04-29 DIAGNOSIS — M9902 Segmental and somatic dysfunction of thoracic region: Secondary | ICD-10-CM | POA: Diagnosis not present

## 2022-04-29 DIAGNOSIS — M9903 Segmental and somatic dysfunction of lumbar region: Secondary | ICD-10-CM | POA: Diagnosis not present

## 2022-04-29 DIAGNOSIS — M9901 Segmental and somatic dysfunction of cervical region: Secondary | ICD-10-CM | POA: Diagnosis not present

## 2022-04-29 DIAGNOSIS — M5411 Radiculopathy, occipito-atlanto-axial region: Secondary | ICD-10-CM | POA: Diagnosis not present

## 2022-04-29 DIAGNOSIS — M62830 Muscle spasm of back: Secondary | ICD-10-CM | POA: Diagnosis not present

## 2022-04-29 DIAGNOSIS — G43101 Migraine with aura, not intractable, with status migrainosus: Secondary | ICD-10-CM | POA: Diagnosis not present

## 2022-06-17 DIAGNOSIS — Z961 Presence of intraocular lens: Secondary | ICD-10-CM | POA: Diagnosis not present

## 2022-06-17 DIAGNOSIS — H353231 Exudative age-related macular degeneration, bilateral, with active choroidal neovascularization: Secondary | ICD-10-CM | POA: Diagnosis not present

## 2022-07-11 DIAGNOSIS — K5900 Constipation, unspecified: Secondary | ICD-10-CM | POA: Diagnosis not present

## 2022-07-15 DIAGNOSIS — H353221 Exudative age-related macular degeneration, left eye, with active choroidal neovascularization: Secondary | ICD-10-CM | POA: Diagnosis not present

## 2022-07-15 DIAGNOSIS — H353211 Exudative age-related macular degeneration, right eye, with active choroidal neovascularization: Secondary | ICD-10-CM | POA: Diagnosis not present

## 2022-07-15 DIAGNOSIS — Z961 Presence of intraocular lens: Secondary | ICD-10-CM | POA: Diagnosis not present

## 2022-07-15 DIAGNOSIS — H353231 Exudative age-related macular degeneration, bilateral, with active choroidal neovascularization: Secondary | ICD-10-CM | POA: Diagnosis not present

## 2022-07-27 ENCOUNTER — Other Ambulatory Visit: Payer: Self-pay | Admitting: Cardiovascular Disease

## 2022-07-28 ENCOUNTER — Other Ambulatory Visit: Payer: Medicare Other

## 2022-08-01 ENCOUNTER — Ambulatory Visit (HOSPITAL_COMMUNITY)
Admission: RE | Admit: 2022-08-01 | Discharge: 2022-08-01 | Disposition: A | Discharge status: Home / Self Care | Payer: Medicare Other | Source: Ambulatory Visit | Attending: Oncology | Admitting: Oncology

## 2022-08-01 ENCOUNTER — Encounter (HOSPITAL_COMMUNITY): Payer: Self-pay

## 2022-08-01 DIAGNOSIS — R918 Other nonspecific abnormal finding of lung field: Secondary | ICD-10-CM | POA: Diagnosis not present

## 2022-08-01 DIAGNOSIS — C649 Malignant neoplasm of unspecified kidney, except renal pelvis: Secondary | ICD-10-CM | POA: Diagnosis not present

## 2022-08-01 DIAGNOSIS — C642 Malignant neoplasm of left kidney, except renal pelvis: Secondary | ICD-10-CM | POA: Diagnosis not present

## 2022-08-01 DIAGNOSIS — N281 Cyst of kidney, acquired: Secondary | ICD-10-CM | POA: Diagnosis not present

## 2022-08-04 ENCOUNTER — Inpatient Hospital Stay: Payer: Medicare Other | Attending: Oncology | Admitting: Oncology

## 2022-08-04 ENCOUNTER — Other Ambulatory Visit: Payer: Self-pay

## 2022-08-04 ENCOUNTER — Inpatient Hospital Stay: Payer: Medicare Other

## 2022-08-04 VITALS — BP 145/51 | HR 55 | Temp 97.5°F | Resp 15 | Ht 66.0 in | Wt 138.4 lb

## 2022-08-04 DIAGNOSIS — Z85528 Personal history of other malignant neoplasm of kidney: Secondary | ICD-10-CM | POA: Diagnosis not present

## 2022-08-04 DIAGNOSIS — Z905 Acquired absence of kidney: Secondary | ICD-10-CM | POA: Insufficient documentation

## 2022-08-04 DIAGNOSIS — Z923 Personal history of irradiation: Secondary | ICD-10-CM | POA: Diagnosis not present

## 2022-08-04 DIAGNOSIS — Z79899 Other long term (current) drug therapy: Secondary | ICD-10-CM | POA: Insufficient documentation

## 2022-08-04 DIAGNOSIS — Z7982 Long term (current) use of aspirin: Secondary | ICD-10-CM | POA: Diagnosis not present

## 2022-08-04 DIAGNOSIS — C642 Malignant neoplasm of left kidney, except renal pelvis: Secondary | ICD-10-CM

## 2022-08-04 NOTE — Progress Notes (Signed)
Hematology and Oncology Follow Up Visit  Donald Todd [id number] 10-04-1929 88 y.o. 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  He presented with localized disease in 2002.   Prior Therapy:  He is status post partial nephrectomy completed on May 03, 2001.  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  He remained disease-free until recently.    On February 11, 2021 he presented with abdominal distention and underwent CT scan of the abdomen and pelvis which showed a 4.8 x 2.8 cm mass in the upper left psoas muscle.    He is status post radiation therapy to the left psoas mass completed on Apr 23, 2021.  He received 50 Gray in 5 fractions.  Current therapy: Active surveillance.  Interim History: Donald Todd returns today for a follow-up.  Since the last visit, he reports no major changes in his health.  He has reported overall decrease in his appetite and weight loss but no other complaints.  He had denies any nausea, vomiting but does report some occasional abdominal discomfort.  His performance status quality of life remains unchanged.     Medications: Updated on review. Current Outpatient Medications  Medication Sig Dispense Refill   acetaminophen (TYLENOL) 500 MG tablet Take 1,000 mg by mouth every 6 (six) hours as needed for moderate pain.     amLODipine (NORVASC) 10 MG tablet TAKE 1 TABLET DAILY 90 tablet 3   amoxicillin (AMOXIL) 500 MG tablet Take 2,000 mg by mouth See admin instructions. Take 2000 mg 1 hour prior to dental work (Patient not taking: Reported on 12/28/2021)     aspirin 81 MG tablet Take 81 mg by  mouth at bedtime.     atorvastatin (LIPITOR) 40 MG tablet TAKE 1 TABLET AT BEDTIME 90 tablet 3   calcium carbonate (TUMS - DOSED IN MG ELEMENTAL CALCIUM) 500 MG chewable tablet Chew 1,000 mg by mouth daily as needed for indigestion or heartburn. (Patient not taking: Reported on 12/28/2021)     docusate sodium (COLACE) 100 MG capsule Take 1 capsule (100 mg total) by mouth every 12 (twelve) hours. (Patient not taking: Reported on 12/28/2021) 60 capsule 0   Docusate Sodium 100 MG capsule 1 tablet as needed     enalapril (VASOTEC) 10 MG tablet TAKE 1 TABLET DAILY 90 tablet 3   ipratropium (ATROVENT) 0.03 % nasal spray Place 1-2 sprays into both nostrils 2 (two) times daily as needed (allergies).  (Patient not taking: Reported on 12/28/2021)     loratadine (CLARITIN) 10 MG tablet Take 10 mg by mouth daily.     meclizine (ANTIVERT) 25 MG tablet 1 tablet as needed     metoprolol succinate (TOPROL-XL) 25 MG 24 hr tablet TAKE 1 TABLET DAILY 90 tablet 3   Multiple Vitamins-Minerals (PRESERVISION AREDS 2) CAPS Take 1 capsule by mouth 2 (two) times daily.     nitroGLYCERIN (NITROSTAT) 0.4 MG SL tablet Place 1 tablet (0.4 mg total) under the tongue as needed. 25 tablet 3   ofloxacin (OCUFLOX) 0.3 % ophthalmic solution Place 1 drop into the right eye See admin instructions. Instill 1 drop into the right eye 4  times daily 3 days before and 3 days after eye injections (Patient not taking: Reported on 12/28/2021)     polyethylene glycol (MIRALAX / GLYCOLAX) 17 g packet Take 17 g by mouth daily. 14 each 2   psyllium (METAMUCIL) 0.52 g capsule 2 capsules with 8 ounces of liquid     No current facility-administered medications for this visit.     Allergies:  Allergies  Allergen Reactions   Tape Hives    Surgical tape - causes blisters   Hydrocodone     Shaking uncontrollably    Iohexol      Desc: PT DOES RECALL REACTION JUST SAYS IT WAS A LONG TIME AGO FOR KIDNEY X-RAYS AND HE DIDN'T TOLERATE IT WELL-ARS  05/02/09-NOTE E-CHART STATES A HOT FEELING IS HIS REACTION, BUT HE CAN'T CONFIRM THAT WAS ALL    Oxycodone Other (See Comments)    Shaking uncontrollably       Physical Exam:  Blood pressure (!) 145/51, pulse (!) 55, temperature (!) 97.5 F (36.4 C), temperature source Oral, resp. rate 15, height '5\' 6"'$  (1.676 m), weight 138 lb 6.4 oz (62.8 kg), SpO2 99 %.   ECOG: 1   General appearance: Alert, awake without any distress. Head: Atraumatic without abnormalities Oropharynx: Without any thrush or ulcers. Eyes: No scleral icterus. Lymph nodes: No lymphadenopathy noted in the cervical, supraclavicular, or axillary nodes Heart:regular rate and rhythm, without any murmurs or gallops.   Lung: Clear to auscultation without any rhonchi, wheezes or dullness to percussion. Abdomin: Soft, nontender without any shifting dullness or ascites. Musculoskeletal: No clubbing or cyanosis. Neurological: No motor or sensory deficits. Skin: No rashes or lesions.        Lab Results: Lab Results  Component Value Date   WBC 8.0 01/20/2022   HGB 13.1 01/20/2022   HCT 40.5 01/20/2022   MCV 91.8 01/20/2022   PLT 178 01/20/2022     Chemistry      Component Value Date/Time   NA 140 01/20/2022 1141   K 4.4 01/20/2022 1141   CL 105 01/20/2022 1141   CO2 26 01/20/2022 1141   BUN 26 (H) 01/20/2022 1141   CREATININE 1.41 (H) 01/20/2022 1141      Component Value Date/Time   CALCIUM 9.6 01/20/2022 1141   ALKPHOS 114 01/20/2022 1141   AST 20 01/20/2022 1141   ALT 19 01/20/2022 1141   BILITOT 0.5 01/20/2022 1141     Study Result    IMPRESSION: 1. Stable appearance of soft tissue nodule along the superior aspect of the left psoas muscle, which represents biopsy-proven metastasis. 2. No significant change in size of pulmonary nodules. 3. No new sites of disease identified. 4. Aortic Atherosclerosis (ICD10-I70.0).   Impression and Plan:  88 year old man with:   1.  Stage IV  clear-cell renal cell carcinoma with left iliopsoas and pulmonary nodules.   He is currently on active surveillance without any evidence of disease progression.  To his CT scan obtained on August 01, 2022 was personally reviewed and showed overall disease stability.  Treatment options including oral targeted therapy, immunotherapy or combination of the above were discussed and at this time will be deferred unless he has progression of disease.  It would be a marginal candidate for aggressive measures given his age and frail status.  I recommended continuing to follow-up every 6 months we will repeat imaging studies at that time.  He is agreeable to continue at this time.   2.  Follow-up: In 6 months for  a follow-up.   30  minutes were dedicated to this visit.  The time was spent on reviewing laboratory data, disease status update and outlining future plan of care discussion.  Zola Button, MD 8/31/202310:32 AM

## 2022-08-12 ENCOUNTER — Other Ambulatory Visit: Payer: Self-pay | Admitting: Cardiovascular Disease

## 2022-08-17 DIAGNOSIS — H353231 Exudative age-related macular degeneration, bilateral, with active choroidal neovascularization: Secondary | ICD-10-CM | POA: Diagnosis not present

## 2022-08-25 IMAGING — CT CT BIOPSY
1 of 4 series · 11 of 32 positions shown, 17 images · non-contrast
Comparison: CT abdomen pelvis-02/11/2021

INDICATION: History of left-sided nephrectomy now with indeterminate soft tissue
mass within the left iliopsoas musculature. Please perform CT-guided
biopsy for tissue diagnostic purposes.

EXAM:
CT-GUIDED BIOPSY INDETERMINATE SOFT TISSUE MASS WITHIN THE LEFT
ILIOPSOAS MUSCULATURE

[Series 2: i-spiral 5.0 b40f · axial · 0.95mm/px · z∈[+1015,+1141]mm · 11 of 44 slices shown, 17 images]
[im 4/44  soft-tissue]
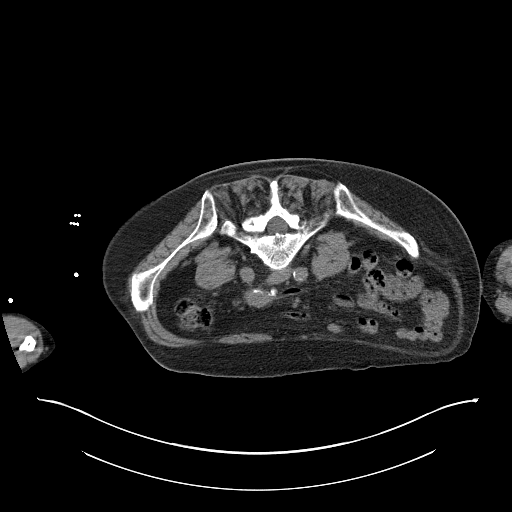
[im 4/44  bone]
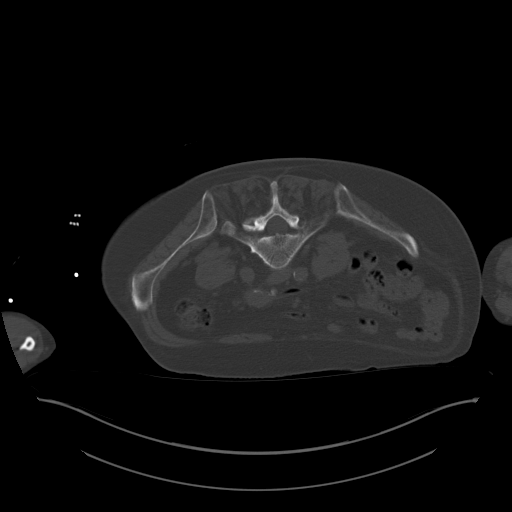
[im 8/44  soft-tissue]
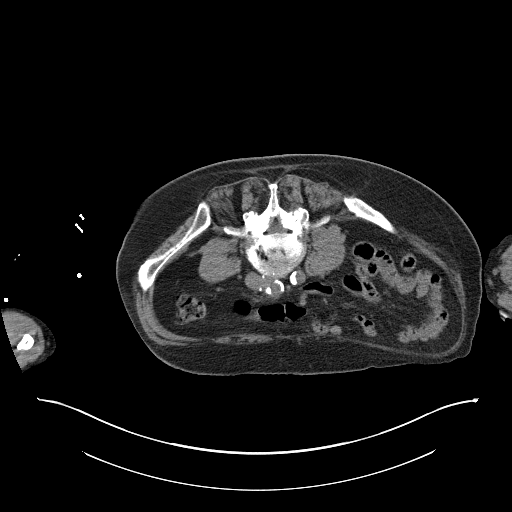
[im 11/44  soft-tissue]
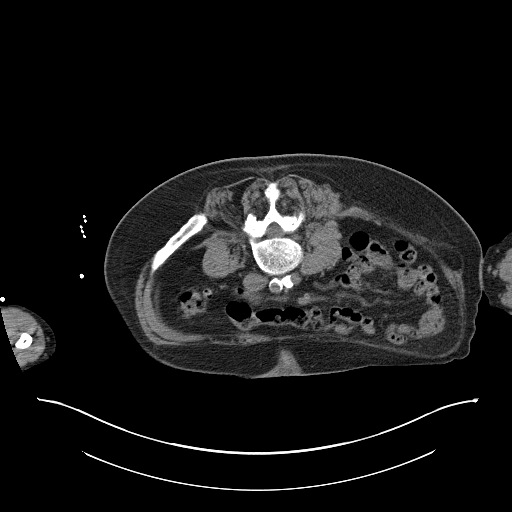
[im 15/44  soft-tissue]
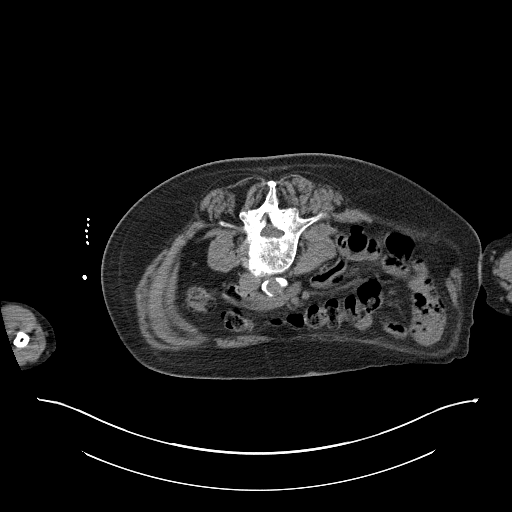
[im 18/44  soft-tissue]
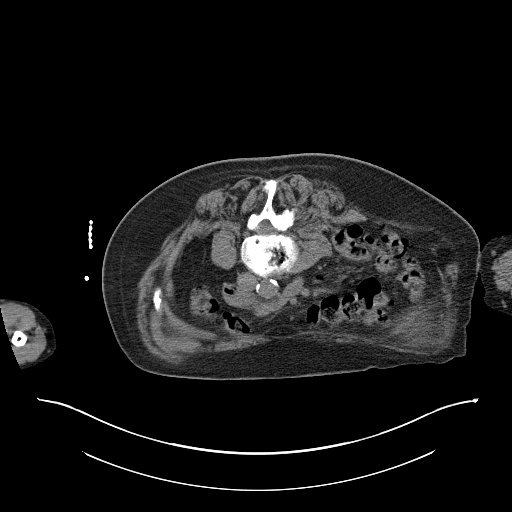
[im 22/44  soft-tissue]
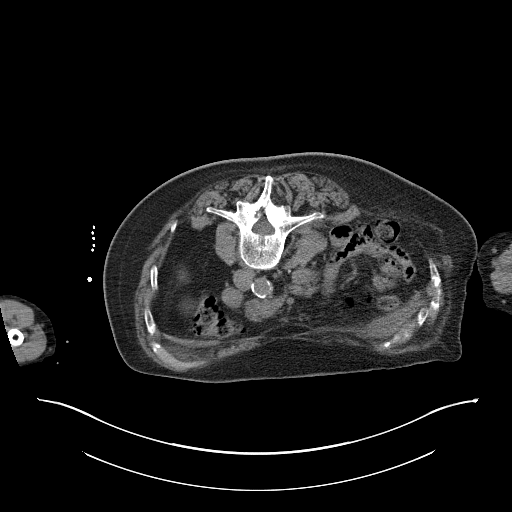
[im 26/44  soft-tissue]
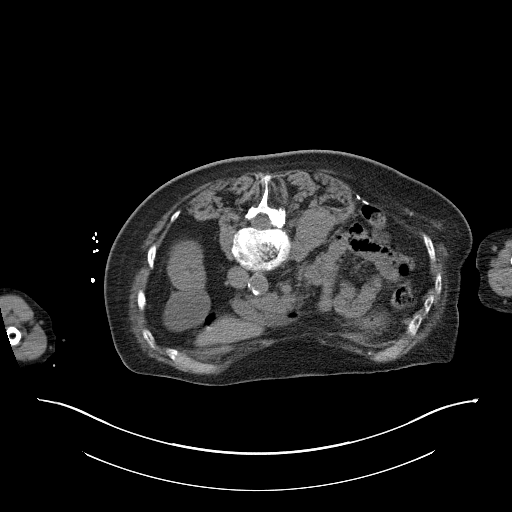
[im 29/44  soft-tissue]
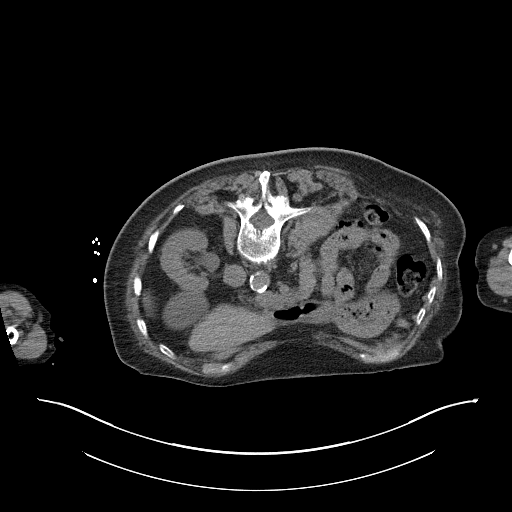
[im 29/44  lung]
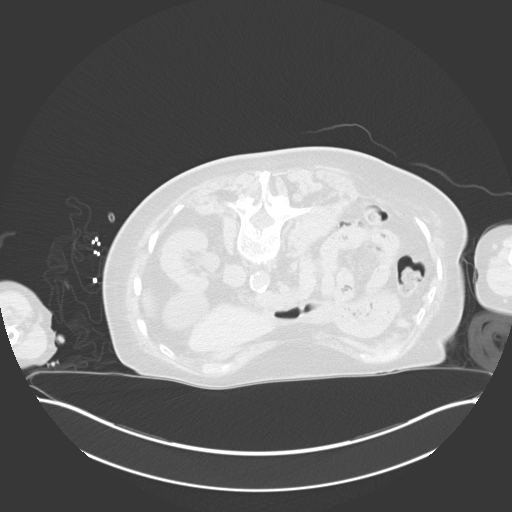
[im 33/44  soft-tissue]
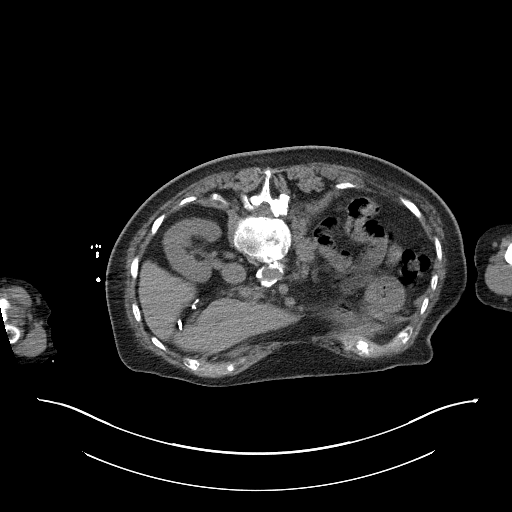
[im 33/44  lung]
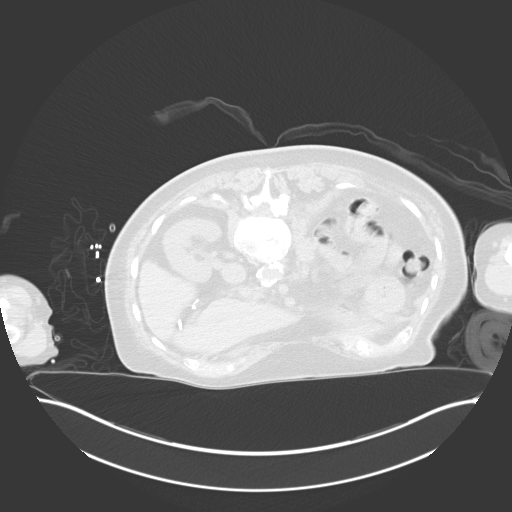
[im 33/44  bone]
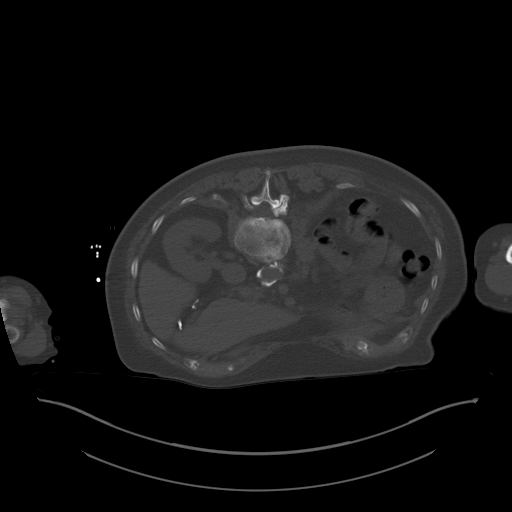
[im 36/44  soft-tissue]
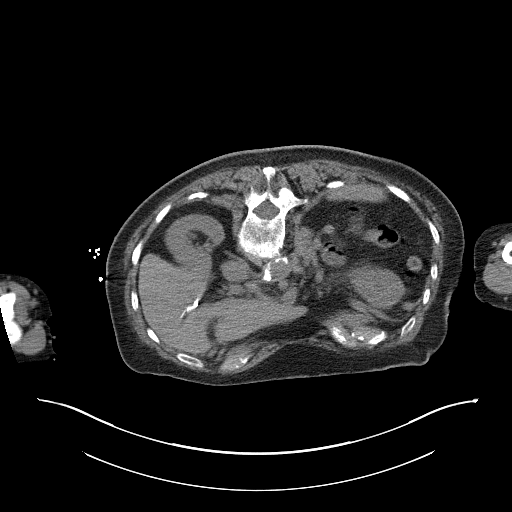
[im 36/44  lung]
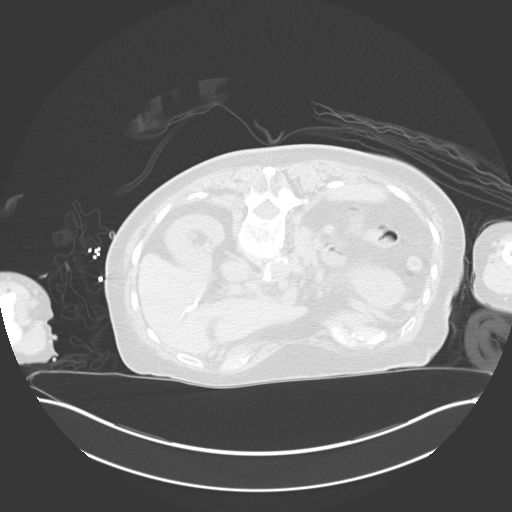
[im 40/44  soft-tissue]
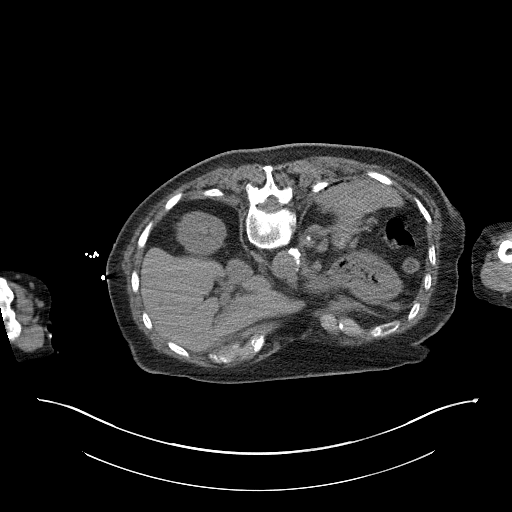
[im 40/44  lung]
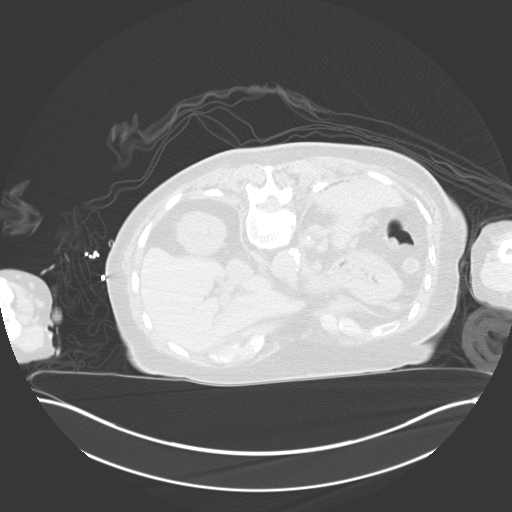

[11 of 32 positions shown; findings below may reference images not displayed]

MEDICATIONS:
None.

ANESTHESIA/SEDATION:
Fentanyl 25 mcg IV; Versed 1 mg IV

Sedation time: 14 minutes; The patient was continuously monitored
during the procedure by the interventional radiology nurse under my
direct supervision.

CONTRAST:  None.

COMPLICATIONS:
SIR Level A - No therapy, no consequence.

Procedure complicated by development of transient asymptomatic
bradycardia not requiring intervention.

Procedure also complicated by development of a small asymptomatic
left-sided retroperitoneal hematoma.

PROCEDURE:
Informed consent was obtained from the patient following an
explanation of the procedure, risks, benefits and alternatives. A
time out was performed prior to the initiation of the procedure.

The patient was positioned prone on the CT table and a limited CT
was performed for procedural planning demonstrating unchanged size
and appearance of the approximately 3.7 x 2.8 cm isoattenuating
nodule/mass within the left iliopsoas musculature (image 18, series
2). The procedure was planned. The operative site was prepped and
draped in the usual sterile fashion. Appropriate trajectory was
confirmed with a 22 gauge spinal needle after the adjacent tissues
were anesthetized with 1% Lidocaine with epinephrine.

Under intermittent CT guidance, a 17 gauge coaxial needle was
advanced into the peripheral aspect of the mass.

Appropriate positioning was confirmed and 5 core needle biopsy
samples were obtained with an 18 gauge core needle biopsy device.
The co-axial needle was removed following administration of a
Gel-Foam slurry and superficial hemostasis was achieved with manual
compression.

Procedure complicated by development of transient asymptomatic
bradycardia as well as a minimal amount of likely expected peri
biopsy retroperitoneal hematoma without intraperitoneal extension. A
dressing was applied. The patient otherwise tolerated the procedure
well without additional immediate postprocedural complication.
IMPRESSION: Technically successful CT guided core needle biopsy of indeterminate
soft tissue nodule/mass within the left iliopsoas musculature.

## 2022-08-31 IMAGING — DX DG ABDOMEN ACUTE W/ 1V CHEST
4 series · 4 of 4 positions shown · non-contrast
Comparison: 02/10/2008

CLINICAL DATA: Constipation

EXAM:
DG ABDOMEN ACUTE WITH 1 VIEW CHEST

[abdomen supine (1 of 2)]
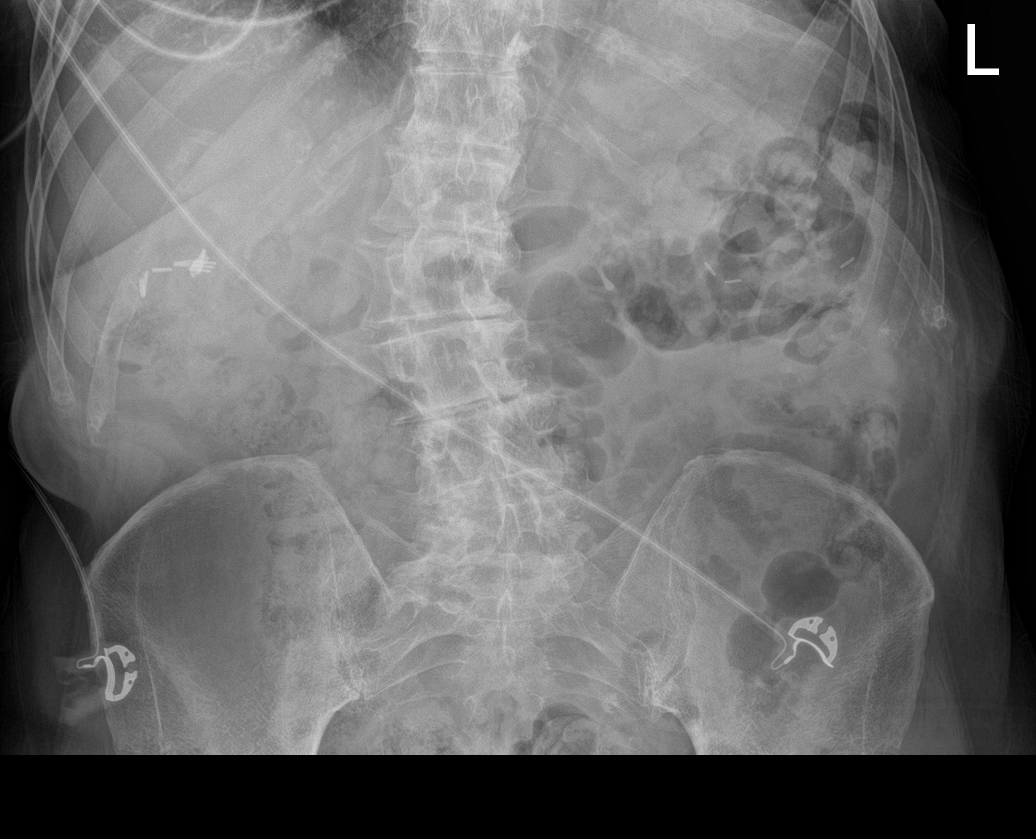

[chest pa]
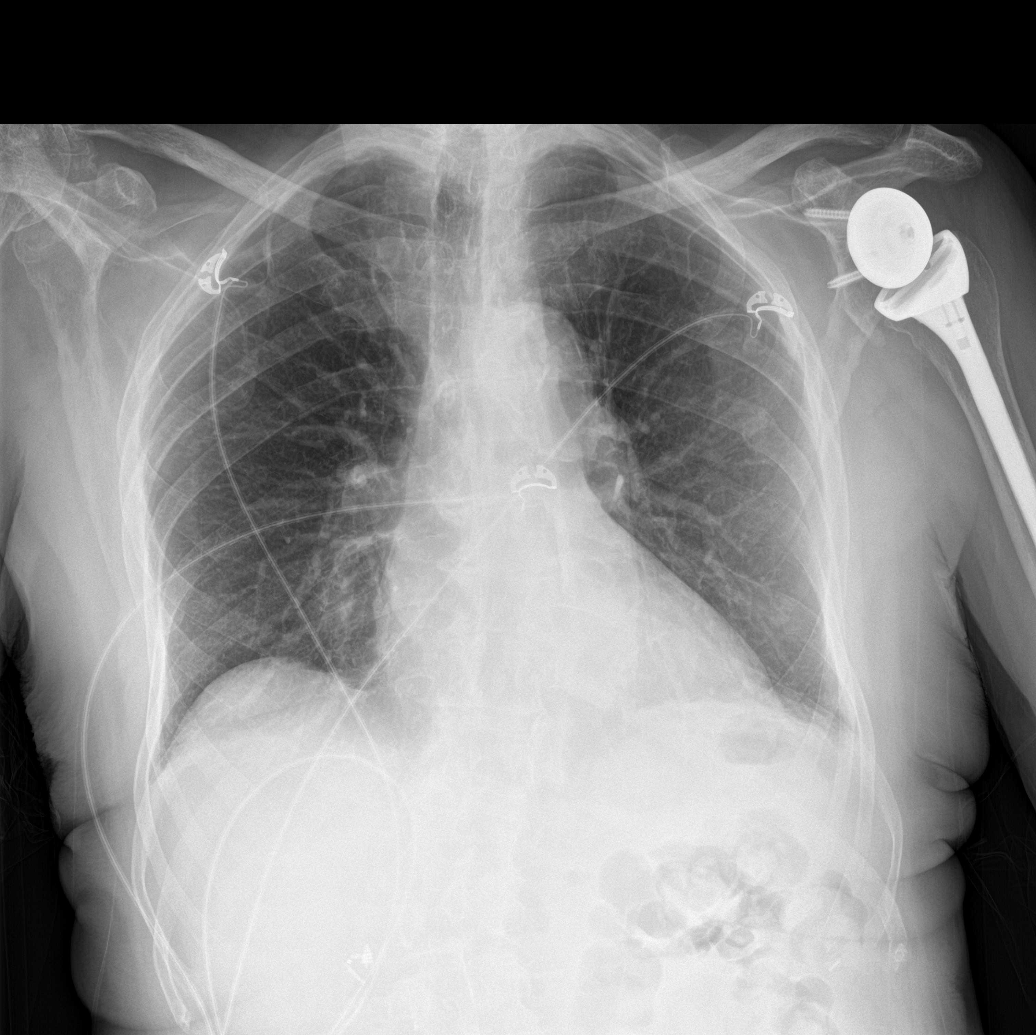

[abdomen erect]
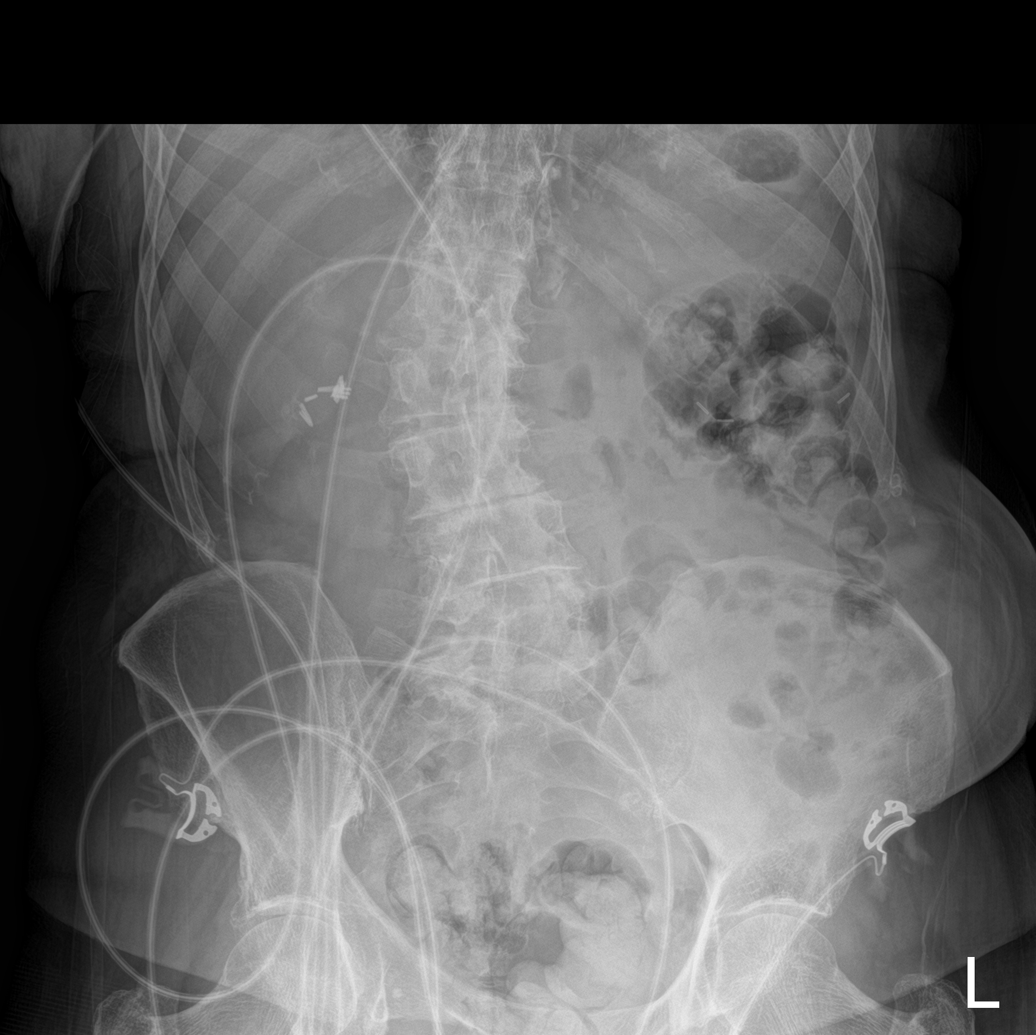

[abdomen supine (2 of 2)]
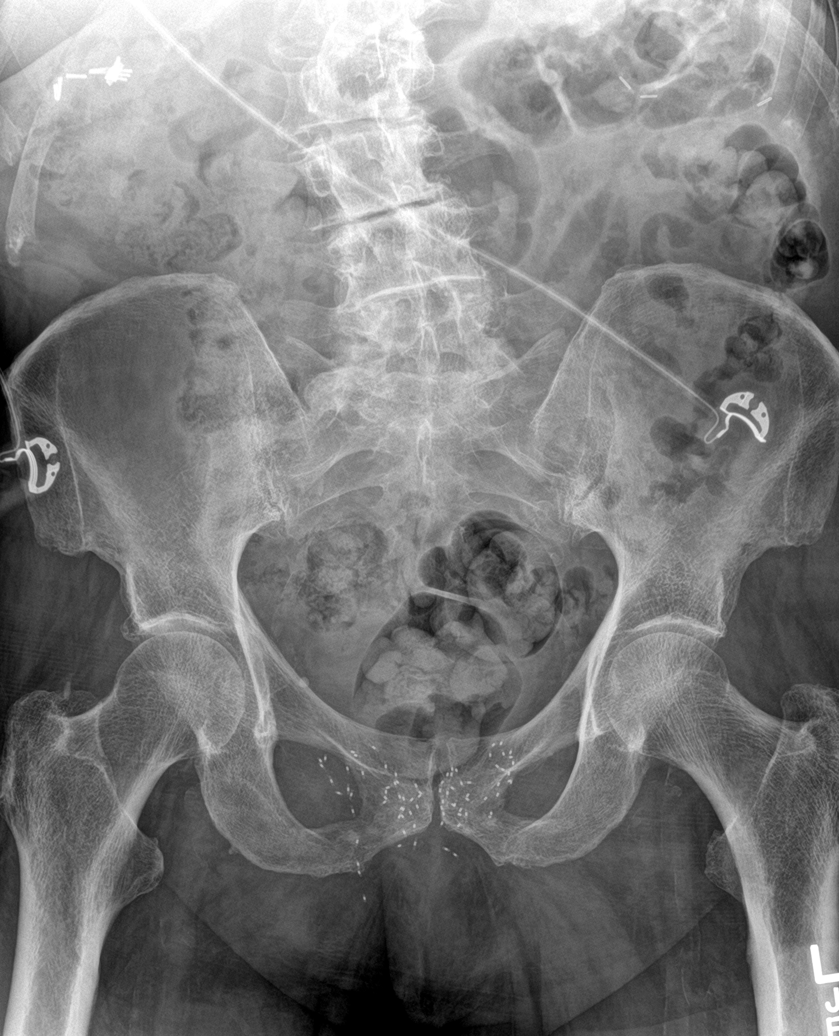

[4 of 4 positions shown; findings below may reference images not displayed]

FINDINGS: Moderate stool burden throughout the colon. Radiation seeds in the
region of the prostate. Prior cholecystectomy. Nonobstructive bowel
gas pattern. No organomegaly or free air.

No confluent airspace opacities. Heart is normal size. Aortic
atherosclerosis. Prior left shoulder replacement.
IMPRESSION: No evidence of bowel obstruction or free air. Moderate stool burden
throughout the colon.

No acute cardiopulmonary disease.

## 2022-09-16 DIAGNOSIS — H353211 Exudative age-related macular degeneration, right eye, with active choroidal neovascularization: Secondary | ICD-10-CM | POA: Diagnosis not present

## 2022-09-16 DIAGNOSIS — H353221 Exudative age-related macular degeneration, left eye, with active choroidal neovascularization: Secondary | ICD-10-CM | POA: Diagnosis not present

## 2022-09-16 DIAGNOSIS — Z961 Presence of intraocular lens: Secondary | ICD-10-CM | POA: Diagnosis not present

## 2022-09-16 DIAGNOSIS — H353231 Exudative age-related macular degeneration, bilateral, with active choroidal neovascularization: Secondary | ICD-10-CM | POA: Diagnosis not present

## 2022-09-16 DIAGNOSIS — H3589 Other specified retinal disorders: Secondary | ICD-10-CM | POA: Diagnosis not present

## 2022-09-30 DIAGNOSIS — H3589 Other specified retinal disorders: Secondary | ICD-10-CM | POA: Diagnosis not present

## 2022-09-30 DIAGNOSIS — H353211 Exudative age-related macular degeneration, right eye, with active choroidal neovascularization: Secondary | ICD-10-CM | POA: Diagnosis not present

## 2022-10-24 DIAGNOSIS — Z23 Encounter for immunization: Secondary | ICD-10-CM | POA: Diagnosis not present

## 2022-11-09 ENCOUNTER — Telehealth: Payer: Self-pay | Admitting: Oncology

## 2022-11-09 DIAGNOSIS — H353231 Exudative age-related macular degeneration, bilateral, with active choroidal neovascularization: Secondary | ICD-10-CM | POA: Diagnosis not present

## 2022-11-09 DIAGNOSIS — Z961 Presence of intraocular lens: Secondary | ICD-10-CM | POA: Diagnosis not present

## 2022-11-09 NOTE — Telephone Encounter (Signed)
Called patient per dr. Alen Blew transition. Patient would like to continue care with Dr. Marin Olp at [facility]. Sending staff message to schedulers at that location

## 2022-11-11 DIAGNOSIS — H353231 Exudative age-related macular degeneration, bilateral, with active choroidal neovascularization: Secondary | ICD-10-CM | POA: Diagnosis not present

## 2022-11-11 DIAGNOSIS — Z961 Presence of intraocular lens: Secondary | ICD-10-CM | POA: Diagnosis not present

## 2022-11-18 DIAGNOSIS — K5900 Constipation, unspecified: Secondary | ICD-10-CM | POA: Diagnosis not present

## 2022-12-02 ENCOUNTER — Ambulatory Visit: Payer: Medicare Other | Admitting: Hematology & Oncology

## 2022-12-02 ENCOUNTER — Other Ambulatory Visit: Payer: Medicare Other

## 2022-12-12 DIAGNOSIS — R7301 Impaired fasting glucose: Secondary | ICD-10-CM | POA: Diagnosis not present

## 2022-12-12 DIAGNOSIS — I10 Essential (primary) hypertension: Secondary | ICD-10-CM | POA: Diagnosis not present

## 2022-12-12 DIAGNOSIS — E7800 Pure hypercholesterolemia, unspecified: Secondary | ICD-10-CM | POA: Diagnosis not present

## 2022-12-14 DIAGNOSIS — G43101 Migraine with aura, not intractable, with status migrainosus: Secondary | ICD-10-CM | POA: Diagnosis not present

## 2022-12-14 DIAGNOSIS — M9902 Segmental and somatic dysfunction of thoracic region: Secondary | ICD-10-CM | POA: Diagnosis not present

## 2022-12-14 DIAGNOSIS — M9903 Segmental and somatic dysfunction of lumbar region: Secondary | ICD-10-CM | POA: Diagnosis not present

## 2022-12-14 DIAGNOSIS — M62830 Muscle spasm of back: Secondary | ICD-10-CM | POA: Diagnosis not present

## 2022-12-14 DIAGNOSIS — M5411 Radiculopathy, occipito-atlanto-axial region: Secondary | ICD-10-CM | POA: Diagnosis not present

## 2022-12-14 DIAGNOSIS — M9901 Segmental and somatic dysfunction of cervical region: Secondary | ICD-10-CM | POA: Diagnosis not present

## 2022-12-23 DIAGNOSIS — H353123 Nonexudative age-related macular degeneration, left eye, advanced atrophic without subfoveal involvement: Secondary | ICD-10-CM | POA: Diagnosis not present

## 2022-12-23 DIAGNOSIS — Z961 Presence of intraocular lens: Secondary | ICD-10-CM | POA: Diagnosis not present

## 2022-12-23 DIAGNOSIS — H43813 Vitreous degeneration, bilateral: Secondary | ICD-10-CM | POA: Diagnosis not present

## 2022-12-23 DIAGNOSIS — H353221 Exudative age-related macular degeneration, left eye, with active choroidal neovascularization: Secondary | ICD-10-CM | POA: Diagnosis not present

## 2022-12-23 DIAGNOSIS — H353211 Exudative age-related macular degeneration, right eye, with active choroidal neovascularization: Secondary | ICD-10-CM | POA: Diagnosis not present

## 2022-12-24 ENCOUNTER — Other Ambulatory Visit: Payer: Self-pay | Admitting: Cardiovascular Disease

## 2022-12-27 DIAGNOSIS — E7800 Pure hypercholesterolemia, unspecified: Secondary | ICD-10-CM | POA: Diagnosis not present

## 2022-12-27 DIAGNOSIS — M150 Primary generalized (osteo)arthritis: Secondary | ICD-10-CM | POA: Diagnosis not present

## 2022-12-27 DIAGNOSIS — I10 Essential (primary) hypertension: Secondary | ICD-10-CM | POA: Diagnosis not present

## 2022-12-27 DIAGNOSIS — N183 Chronic kidney disease, stage 3 unspecified: Secondary | ICD-10-CM | POA: Diagnosis not present

## 2022-12-27 DIAGNOSIS — I2510 Atherosclerotic heart disease of native coronary artery without angina pectoris: Secondary | ICD-10-CM | POA: Diagnosis not present

## 2022-12-30 ENCOUNTER — Ambulatory Visit: Payer: Medicare Other | Attending: Cardiovascular Disease | Admitting: Cardiovascular Disease

## 2022-12-30 ENCOUNTER — Encounter: Payer: Self-pay | Admitting: Cardiovascular Disease

## 2022-12-30 VITALS — BP 134/62 | HR 56 | Ht 66.0 in | Wt 138.2 lb

## 2022-12-30 DIAGNOSIS — I2510 Atherosclerotic heart disease of native coronary artery without angina pectoris: Secondary | ICD-10-CM | POA: Diagnosis not present

## 2022-12-30 DIAGNOSIS — I10 Essential (primary) hypertension: Secondary | ICD-10-CM | POA: Insufficient documentation

## 2022-12-30 DIAGNOSIS — E785 Hyperlipidemia, unspecified: Secondary | ICD-10-CM | POA: Insufficient documentation

## 2022-12-30 DIAGNOSIS — Z9861 Coronary angioplasty status: Secondary | ICD-10-CM | POA: Diagnosis not present

## 2022-12-30 NOTE — Assessment & Plan Note (Signed)
History of CAD status post RCA and ramus branch stenting by myself in 1997.  I catheterized him 12/22/2003 revealing patent stent with 60% mid LAD stenosis.  Catheterization performed in 2011 revealed patent stents with noncritical CAD.  His last Myoview performed 05/07/2020 showed lateral scar without ischemia unchanged from prior stress test.  He denies chest pain.

## 2022-12-30 NOTE — Progress Notes (Signed)
12/30/2022 Donald Todd   08-Sep-1929  [id number]  Primary Physician [name], MD Primary Cardiologist: Lorretta Harp MD FACP, [place], [place], [place]  HPI:  Donald Todd is a 89 y.o.  thin-appearing married Caucasian male, father of 78, grandfather to 7 grandchildren, who I last  saw in the office January 24th, 2023.  He has a history of RCA and ramus branch stenting by myself back in 1997. I catheterized him, December 22, 2003, revealing patent stent with a 60% mid LAD lesion, normal LV function. He did have a 40% ostial right renal artery stenosis with a known left nephrectomy. His last Myoview, performed August 04, 2010, revealed lateral scar, and catheterization performed several weeks prior to that revealed patent stents with noncritical CAD. He has remained asymptomatic. His last .Myoview stress test performed 02/15/13 moderate sized lateral wall scar consistent with his anatomy. He saw [name] in the office 04/03/17 after a syncopal episode that occurred while he was bending over and running up. He's had a workup including an MRI of his brain which is unremarkable. He did injure his left shoulder and had this surgically addressed.   Because of some atypical chest pain I perform Myoview stress testing on him 05/07/2020 that showed lateral scar without ischemia unchanged from prior Myoview stress test performed in 2014.[name]  His pain is somewhat atypical.   Unfortunately, he was diagnosed with cancer again.  He originally was diagnosed with left renal cell cancer in early 2000's status post left nephrectomy in 2005.  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  He has had radiation therapy.  Since I saw him a year ago he is remained stable.  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  He gets occasional atypical chest pain which has not changed in frequency or severity.      Current Meds  Medication Sig   acetaminophen (TYLENOL) 500 MG tablet Take 1,000 mg by mouth every 6 (six) hours as needed for moderate pain.   amLODipine (NORVASC) 10 MG tablet TAKE 1 TABLET DAILY   amoxicillin (AMOXIL) 500 MG tablet Take 2,000 mg by mouth See admin instructions. Take 2000 mg 1 hour prior to dental work   aspirin 81 MG tablet Take 81 mg by mouth at bedtime.   atorvastatin (LIPITOR) 40 MG tablet TAKE 1 TABLET AT BEDTIME   calcium carbonate (TUMS - DOSED IN MG ELEMENTAL CALCIUM) 500 MG chewable tablet Chew 1,000 mg by mouth daily as needed for indigestion or heartburn.   docusate sodium (COLACE) 100 MG capsule Take 1 capsule (100 mg total) by mouth every 12 (twelve) hours.   Docusate Sodium 100 MG capsule 1 tablet as needed   enalapril (VASOTEC) 10 MG tablet TAKE 1 TABLET DAILY   ipratropium (ATROVENT) 0.03 % nasal spray Place 1-2 sprays into both nostrils 2 (two) times daily as needed (allergies).   loratadine (CLARITIN) 10 MG tablet Take 10 mg by mouth daily.   meclizine (ANTIVERT) 25 MG tablet 1 tablet as needed   metoprolol succinate (TOPROL-XL) 25 MG 24 hr tablet TAKE 1 TABLET DAILY   Multiple Vitamins-Minerals (PRESERVISION AREDS 2) CAPS Take 1 capsule by mouth 2 (two) times daily.   nitroGLYCERIN (NITROSTAT) 0.4 MG SL tablet Place 1 tablet (0.4 mg total) under the tongue as needed.   ofloxacin (OCUFLOX) 0.3 % ophthalmic  solution Place 1 drop into the right eye See admin instructions. Instill 1 drop into the right eye 4 times daily 3 days before and 3 days after eye injections   polyethylene glycol (MIRALAX / GLYCOLAX) 17 g packet Take 17 g by mouth daily.   psyllium (METAMUCIL) 0.52 g capsule 2 capsules with 8 ounces of liquid     Allergies  Allergen Reactions   Tape Hives    Surgical tape - causes blisters   Hydrocodone     Shaking uncontrollably    Iohexol      Desc: PT DOES RECALL REACTION JUST SAYS IT WAS A LONG TIME AGO FOR KIDNEY X-RAYS AND HE DIDN'T  TOLERATE IT WELL-ARS 05/02/09-NOTE E-CHART STATES A HOT FEELING IS HIS REACTION, BUT HE CAN'T CONFIRM THAT WAS ALL    Oxycodone Other (See Comments)    Shaking uncontrollably     Social History   Socioeconomic History   Marital status: Married    Spouse name: Not on file   Number of children: 5   Years of education: 12   Highest education level: Not on file  Occupational History   Occupation: Retired    Comment: Firefighter, Location manager office  Tobacco Use   Smoking status: Former    Packs/day: 0.25    Years: 13.00    Total pack years: 3.25    Types: Cigarettes, Cigars    Quit date: 12/14/1963    Years since quitting: 59.0   Smokeless tobacco: Never  Vaping Use   Vaping Use: Never used  Substance and Sexual Activity   Alcohol use: No   Drug use: No   Sexual activity: Not Currently  Other Topics Concern   Not on file  Social History Narrative   Lives at home w/ his wife   Right-handed   Caffeine: 2 cups of coffee per day, rare soft drink   Social Determinants of Health   Financial Resource Strain: Not on file  Food Insecurity: Not on file  Transportation Needs: Not on file  Physical Activity: Not on file  Stress: Not on file  Social Connections: Not on file  Intimate Partner Violence: Not on file     Review of Systems: General: negative for chills, fever, night sweats or weight changes.  Cardiovascular: negative for chest pain, dyspnea on exertion, edema, orthopnea, palpitations, paroxysmal nocturnal dyspnea or shortness of breath Dermatological: negative for rash Respiratory: negative for cough or wheezing Urologic: negative for hematuria Abdominal: negative for nausea, vomiting, diarrhea, bright red blood per rectum, melena, or hematemesis Neurologic: negative for visual changes, syncope, or dizziness All other systems reviewed and are otherwise negative except as noted above.    Blood pressure 134/62, pulse (!) 56, height '5\' 6"'$  (1.676 m), weight 138 lb 3.2 oz (62.7  kg), SpO2 98 %.  General appearance: alert and no distress Neck: no adenopathy, no carotid bruit, no JVD, supple, symmetrical, trachea midline, and thyroid not enlarged, symmetric, no tenderness/mass/nodules Lungs: clear to auscultation bilaterally Heart: regular rate and rhythm, S1, S2 normal, no murmur, click, rub or gallop Extremities: extremities normal, atraumatic, no cyanosis or edema Pulses: 2+ and symmetric Skin: Skin color, texture, turgor normal. No rashes or lesions Neurologic: Grossly normal  EKG sinus bradycardia 56 with incomplete right bundle branch block and left anterior fascicular block.  I personally reviewed this EKG.  ASSESSMENT AND PLAN:   Essential hypertension History of essential hypertension blood pressure measured today at 134/62.  He is on amlodipine, metoprolol and enalapril.  CAD  S/P percutaneous coronary angioplasty History of CAD status post RCA and ramus branch stenting by myself in 1997.  I catheterized him 12/22/2003 revealing patent stent with 60% mid LAD stenosis.  Catheterization performed in 2011 revealed patent stents with noncritical CAD.  His last Myoview performed 05/07/2020 showed lateral scar without ischemia unchanged from prior stress test.  He denies chest pain.  Dyslipidemia History of hyperlipidemia on statin therapy lipid profile performed 12/12/2022 Total cholesterol of 155, LDL 55 HDL of 82.     Lorretta Harp MD FACP,FACC,FAHA, [facility] 12/30/2022 2:25 PM

## 2022-12-30 NOTE — Assessment & Plan Note (Signed)
History of hyperlipidemia on statin therapy lipid profile performed 12/12/2022 Total cholesterol of 155, LDL 55 HDL of 82.

## 2022-12-30 NOTE — Patient Instructions (Signed)

## 2022-12-30 NOTE — Assessment & Plan Note (Signed)
History of essential hypertension blood pressure measured today at 134/62.  He is on amlodipine, metoprolol and enalapril.

## 2023-01-02 DIAGNOSIS — Z8546 Personal history of malignant neoplasm of prostate: Secondary | ICD-10-CM | POA: Diagnosis not present

## 2023-01-02 DIAGNOSIS — Z85528 Personal history of other malignant neoplasm of kidney: Secondary | ICD-10-CM | POA: Diagnosis not present

## 2023-01-24 DIAGNOSIS — R7303 Prediabetes: Secondary | ICD-10-CM | POA: Diagnosis not present

## 2023-01-24 DIAGNOSIS — J3489 Other specified disorders of nose and nasal sinuses: Secondary | ICD-10-CM | POA: Diagnosis not present

## 2023-01-24 DIAGNOSIS — Z6822 Body mass index (BMI) 22.0-22.9, adult: Secondary | ICD-10-CM | POA: Diagnosis not present

## 2023-01-24 DIAGNOSIS — R2689 Other abnormalities of gait and mobility: Secondary | ICD-10-CM | POA: Diagnosis not present

## 2023-01-24 DIAGNOSIS — Z0000 Encounter for general adult medical examination without abnormal findings: Secondary | ICD-10-CM | POA: Diagnosis not present

## 2023-01-24 DIAGNOSIS — H539 Unspecified visual disturbance: Secondary | ICD-10-CM | POA: Diagnosis not present

## 2023-01-30 ENCOUNTER — Ambulatory Visit (HOSPITAL_BASED_OUTPATIENT_CLINIC_OR_DEPARTMENT_OTHER): Payer: Medicare Other

## 2023-02-01 ENCOUNTER — Other Ambulatory Visit: Payer: Self-pay | Admitting: Oncology

## 2023-02-01 ENCOUNTER — Ambulatory Visit (HOSPITAL_BASED_OUTPATIENT_CLINIC_OR_DEPARTMENT_OTHER)
Admission: RE | Admit: 2023-02-01 | Discharge: 2023-02-01 | Disposition: A | Discharge status: Home / Self Care | Payer: Medicare Other | Source: Ambulatory Visit | Attending: Oncology | Admitting: Oncology

## 2023-02-01 DIAGNOSIS — N189 Chronic kidney disease, unspecified: Secondary | ICD-10-CM | POA: Diagnosis not present

## 2023-02-01 DIAGNOSIS — C642 Malignant neoplasm of left kidney, except renal pelvis: Secondary | ICD-10-CM

## 2023-02-01 DIAGNOSIS — R2681 Unsteadiness on feet: Secondary | ICD-10-CM | POA: Diagnosis present

## 2023-02-01 DIAGNOSIS — Z8701 Personal history of pneumonia (recurrent): Secondary | ICD-10-CM | POA: Diagnosis not present

## 2023-02-01 DIAGNOSIS — N390 Urinary tract infection, site not specified: Secondary | ICD-10-CM | POA: Diagnosis not present

## 2023-02-01 DIAGNOSIS — C7801 Secondary malignant neoplasm of right lung: Secondary | ICD-10-CM | POA: Diagnosis not present

## 2023-02-01 DIAGNOSIS — I444 Left anterior fascicular block: Secondary | ICD-10-CM | POA: Diagnosis present

## 2023-02-01 DIAGNOSIS — S2242XA Multiple fractures of ribs, left side, initial encounter for closed fracture: Secondary | ICD-10-CM | POA: Diagnosis not present

## 2023-02-01 DIAGNOSIS — H3530 Unspecified macular degeneration: Secondary | ICD-10-CM | POA: Diagnosis present

## 2023-02-01 DIAGNOSIS — I2510 Atherosclerotic heart disease of native coronary artery without angina pectoris: Secondary | ICD-10-CM | POA: Diagnosis not present

## 2023-02-01 DIAGNOSIS — R54 Age-related physical debility: Secondary | ICD-10-CM | POA: Diagnosis not present

## 2023-02-01 DIAGNOSIS — R001 Bradycardia, unspecified: Secondary | ICD-10-CM | POA: Diagnosis not present

## 2023-02-01 DIAGNOSIS — R0602 Shortness of breath: Secondary | ICD-10-CM | POA: Diagnosis not present

## 2023-02-01 DIAGNOSIS — E860 Dehydration: Secondary | ICD-10-CM | POA: Diagnosis not present

## 2023-02-01 DIAGNOSIS — R918 Other nonspecific abnormal finding of lung field: Secondary | ICD-10-CM | POA: Diagnosis not present

## 2023-02-01 DIAGNOSIS — Z515 Encounter for palliative care: Secondary | ICD-10-CM | POA: Diagnosis not present

## 2023-02-01 DIAGNOSIS — Z85528 Personal history of other malignant neoplasm of kidney: Secondary | ICD-10-CM | POA: Diagnosis not present

## 2023-02-01 DIAGNOSIS — M1990 Unspecified osteoarthritis, unspecified site: Secondary | ICD-10-CM | POA: Diagnosis present

## 2023-02-01 DIAGNOSIS — Z66 Do not resuscitate: Secondary | ICD-10-CM | POA: Diagnosis not present

## 2023-02-01 DIAGNOSIS — Z905 Acquired absence of kidney: Secondary | ICD-10-CM | POA: Diagnosis not present

## 2023-02-01 DIAGNOSIS — R338 Other retention of urine: Secondary | ICD-10-CM | POA: Diagnosis not present

## 2023-02-01 DIAGNOSIS — D649 Anemia, unspecified: Secondary | ICD-10-CM | POA: Diagnosis present

## 2023-02-01 DIAGNOSIS — H548 Legal blindness, as defined in USA: Secondary | ICD-10-CM | POA: Diagnosis present

## 2023-02-01 DIAGNOSIS — F05 Delirium due to known physiological condition: Secondary | ICD-10-CM | POA: Diagnosis not present

## 2023-02-01 DIAGNOSIS — I252 Old myocardial infarction: Secondary | ICD-10-CM | POA: Diagnosis not present

## 2023-02-01 DIAGNOSIS — R630 Anorexia: Secondary | ICD-10-CM | POA: Diagnosis present

## 2023-02-01 DIAGNOSIS — J9811 Atelectasis: Secondary | ICD-10-CM | POA: Diagnosis present

## 2023-02-01 DIAGNOSIS — I440 Atrioventricular block, first degree: Secondary | ICD-10-CM | POA: Diagnosis present

## 2023-02-01 DIAGNOSIS — Z955 Presence of coronary angioplasty implant and graft: Secondary | ICD-10-CM | POA: Diagnosis not present

## 2023-02-01 DIAGNOSIS — K5901 Slow transit constipation: Secondary | ICD-10-CM | POA: Diagnosis not present

## 2023-02-01 DIAGNOSIS — C7989 Secondary malignant neoplasm of other specified sites: Secondary | ICD-10-CM | POA: Diagnosis present

## 2023-02-01 DIAGNOSIS — Z87891 Personal history of nicotine dependence: Secondary | ICD-10-CM | POA: Diagnosis not present

## 2023-02-01 DIAGNOSIS — E785 Hyperlipidemia, unspecified: Secondary | ICD-10-CM | POA: Diagnosis not present

## 2023-02-01 DIAGNOSIS — Z96612 Presence of left artificial shoulder joint: Secondary | ICD-10-CM | POA: Diagnosis present

## 2023-02-01 DIAGNOSIS — J984 Other disorders of lung: Secondary | ICD-10-CM | POA: Diagnosis not present

## 2023-02-01 DIAGNOSIS — N1831 Chronic kidney disease, stage 3a: Secondary | ICD-10-CM | POA: Diagnosis not present

## 2023-02-01 DIAGNOSIS — N179 Acute kidney failure, unspecified: Secondary | ICD-10-CM | POA: Diagnosis not present

## 2023-02-01 DIAGNOSIS — Z8616 Personal history of COVID-19: Secondary | ICD-10-CM | POA: Diagnosis not present

## 2023-02-01 DIAGNOSIS — R059 Cough, unspecified: Secondary | ICD-10-CM | POA: Diagnosis not present

## 2023-02-01 DIAGNOSIS — E871 Hypo-osmolality and hyponatremia: Secondary | ICD-10-CM | POA: Diagnosis not present

## 2023-02-01 DIAGNOSIS — I129 Hypertensive chronic kidney disease with stage 1 through stage 4 chronic kidney disease, or unspecified chronic kidney disease: Secondary | ICD-10-CM | POA: Diagnosis not present

## 2023-02-01 DIAGNOSIS — U071 COVID-19: Secondary | ICD-10-CM | POA: Diagnosis not present

## 2023-02-01 DIAGNOSIS — R5381 Other malaise: Secondary | ICD-10-CM | POA: Diagnosis not present

## 2023-02-01 DIAGNOSIS — Z681 Body mass index (BMI) 19 or less, adult: Secondary | ICD-10-CM | POA: Diagnosis not present

## 2023-02-01 DIAGNOSIS — K5730 Diverticulosis of large intestine without perforation or abscess without bleeding: Secondary | ICD-10-CM | POA: Diagnosis not present

## 2023-02-01 DIAGNOSIS — R42 Dizziness and giddiness: Secondary | ICD-10-CM | POA: Diagnosis not present

## 2023-02-01 DIAGNOSIS — R339 Retention of urine, unspecified: Secondary | ICD-10-CM | POA: Diagnosis not present

## 2023-02-01 DIAGNOSIS — E440 Moderate protein-calorie malnutrition: Secondary | ICD-10-CM | POA: Diagnosis present

## 2023-02-01 DIAGNOSIS — K769 Liver disease, unspecified: Secondary | ICD-10-CM | POA: Diagnosis not present

## 2023-02-01 DIAGNOSIS — D696 Thrombocytopenia, unspecified: Secondary | ICD-10-CM | POA: Diagnosis not present

## 2023-02-01 DIAGNOSIS — I10 Essential (primary) hypertension: Secondary | ICD-10-CM | POA: Diagnosis not present

## 2023-02-01 DIAGNOSIS — N289 Disorder of kidney and ureter, unspecified: Secondary | ICD-10-CM | POA: Diagnosis not present

## 2023-02-02 ENCOUNTER — Inpatient Hospital Stay: Payer: Medicare Other

## 2023-02-02 ENCOUNTER — Encounter: Payer: Self-pay | Admitting: Hematology & Oncology

## 2023-02-02 ENCOUNTER — Other Ambulatory Visit: Payer: Medicare Other

## 2023-02-02 ENCOUNTER — Inpatient Hospital Stay (HOSPITAL_BASED_OUTPATIENT_CLINIC_OR_DEPARTMENT_OTHER): Payer: Medicare Other | Admitting: Hematology & Oncology

## 2023-02-02 VITALS — BP 144/45 | HR 60 | Temp 98.2°F | Resp 20 | Ht 67.0 in | Wt 139.0 lb

## 2023-02-02 DIAGNOSIS — Z905 Acquired absence of kidney: Secondary | ICD-10-CM | POA: Insufficient documentation

## 2023-02-02 DIAGNOSIS — R918 Other nonspecific abnormal finding of lung field: Secondary | ICD-10-CM | POA: Diagnosis not present

## 2023-02-02 DIAGNOSIS — C642 Malignant neoplasm of left kidney, except renal pelvis: Secondary | ICD-10-CM | POA: Diagnosis not present

## 2023-02-02 DIAGNOSIS — C7989 Secondary malignant neoplasm of other specified sites: Secondary | ICD-10-CM

## 2023-02-02 LAB — CBC WITH DIFFERENTIAL (CANCER CENTER ONLY)
Abs Immature Granulocytes: 0.01 10*3/uL (ref 0.00–0.07)
Basophils Absolute: 0 10*3/uL (ref 0.0–0.1)
Basophils Relative: 1 %
Eosinophils Absolute: 0.1 10*3/uL (ref 0.0–0.5)
Eosinophils Relative: 3 %
HCT: 36.9 % — ABNORMAL LOW (ref 39.0–52.0)
Hemoglobin: 11.9 g/dL — ABNORMAL LOW (ref 13.0–17.0)
Immature Granulocytes: 0 %
Lymphocytes Relative: 9 %
Lymphs Abs: 0.4 10*3/uL — ABNORMAL LOW (ref 0.7–4.0)
MCH: 30.8 pg (ref 26.0–34.0)
MCHC: 32.2 g/dL (ref 30.0–36.0)
MCV: 95.6 fL (ref 80.0–100.0)
Monocytes Absolute: 0.6 10*3/uL (ref 0.1–1.0)
Monocytes Relative: 13 %
Neutro Abs: 3.5 10*3/uL (ref 1.7–7.7)
Neutrophils Relative %: 74 %
Platelet Count: 125 10*3/uL — ABNORMAL LOW (ref 150–400)
RBC: 3.86 MIL/uL — ABNORMAL LOW (ref 4.22–5.81)
RDW: 13 % (ref 11.5–15.5)
WBC Count: 4.8 10*3/uL (ref 4.0–10.5)
nRBC: 0 % (ref 0.0–0.2)

## 2023-02-02 LAB — CMP (CANCER CENTER ONLY)
ALT: 23 U/L (ref 0–44)
AST: 27 U/L (ref 15–41)
Albumin: 3.5 g/dL (ref 3.5–5.0)
Alkaline Phosphatase: 84 U/L (ref 38–126)
Anion gap: 4 — ABNORMAL LOW (ref 5–15)
BUN: 22 mg/dL (ref 8–23)
CO2: 25 mmol/L (ref 22–32)
Calcium: 8.3 mg/dL — ABNORMAL LOW (ref 8.9–10.3)
Chloride: 108 mmol/L (ref 98–111)
Creatinine: 1.45 mg/dL — ABNORMAL HIGH (ref 0.61–1.24)
GFR, Estimated: 45 mL/min — ABNORMAL LOW (ref >60)
Glucose, Bld: 101 mg/dL — ABNORMAL HIGH (ref 70–99)
Potassium: 4.5 mmol/L (ref 3.5–5.1)
Sodium: 137 mmol/L (ref 135–145)
Total Bilirubin: 0.4 mg/dL (ref 0.3–1.2)
Total Protein: 6.4 g/dL — ABNORMAL LOW (ref 6.5–8.1)

## 2023-02-02 LAB — PREALBUMIN: Prealbumin: 17 mg/dL — ABNORMAL LOW (ref 18–38)

## 2023-02-02 LAB — LACTATE DEHYDROGENASE: LDH: 172 U/L (ref 98–192)

## 2023-02-02 NOTE — Progress Notes (Signed)
Hematology and Oncology Follow Up Visit  Donald Todd [id number] 07-31-1929 89 y.o. 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  He presented with localized disease in 2002.   Prior Therapy:  He is status post partial nephrectomy completed on May 03, 2001.  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  He remained disease-free until recently.    On February 11, 2021 he presented with abdominal distention and underwent CT scan of the abdomen and pelvis which showed a 4.8 x 2.8 cm mass in the upper left psoas muscle.    He is status post radiation therapy to the left psoas mass completed on Apr 23, 2021.  He received 50 Gray in 5 fractions.  Current therapy: Active surveillance.  Interim History: Donald Todd returns today for a follow-up.  This is the first office visit to the [facility].  He has been followed quite closely by Dr. Alen Todd.  He is very interesting.  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  As such, he is a true [employer] hero.  He is being followed for his renal cell carcinoma.  He did have a CT scan done today.  The only noted change was the fact that there was a slowly growing nodule in the right middle lobe.  It measures 9 x 8 mm.  Apparently changed 1 mm in a matter of year and a half.  He has had no problems with cough.  He has had no bony issues.  He has had no problems with COVID.  He has had no bleeding or bruising.  There is been no leg swelling.  He has had no headache.  There is been no problems with his appetite.  Again, he is incredibly interesting to talk to.  He  has 3 children who are all in the [employer].  Again I thanked him for their service to our country.  He does not smoke.  He does not drink.  Overall, I would say that his performance status is probably ECOG 2.    Medications: Updated on review. Current Outpatient Medications  Medication Sig Dispense Refill   acetaminophen (TYLENOL) 500 MG tablet Take 1,000 mg by mouth every 6 (six) hours as needed for moderate pain.     amLODipine (NORVASC) 10 MG tablet TAKE 1 TABLET DAILY 90 tablet 3   aspirin 81 MG tablet Take 81 mg by mouth at bedtime.     atorvastatin (LIPITOR) 40 MG tablet TAKE 1 TABLET AT BEDTIME 90 tablet 3   calcium carbonate (TUMS - DOSED IN MG ELEMENTAL CALCIUM) 500 MG chewable tablet Chew 1,000 mg by mouth daily as needed for indigestion or heartburn.     docusate sodium (COLACE) 100 MG capsule Take 1 capsule (100 mg total) by mouth every 12 (twelve) hours. (Patient taking differently: Take 200 mg by mouth daily.) 60 capsule 0   enalapril (VASOTEC) 10 MG tablet TAKE 1 TABLET DAILY 90 tablet 3   ipratropium (ATROVENT) 0.03 % nasal spray Place 1-2 sprays into both nostrils 2 (two) times daily as needed (allergies).     loratadine (CLARITIN) 10 MG  tablet Take 10 mg by mouth daily.     meclizine (ANTIVERT) 25 MG tablet 1 tablet as needed     metoprolol succinate (TOPROL-XL) 25 MG 24 hr tablet TAKE 1 TABLET DAILY 90 tablet 3   Multiple Vitamins-Minerals (PRESERVISION AREDS 2) CAPS Take 1 capsule by mouth 2 (two) times daily.     polyethylene glycol (MIRALAX / GLYCOLAX) 17 g packet Take 17 g by mouth daily. 14 each 2   amoxicillin (AMOXIL) 500 MG tablet Take 2,000 mg by mouth See admin instructions. Take 2000 mg 1 hour prior to dental work (Patient not taking: Reported on 02/02/2023)     nitroGLYCERIN (NITROSTAT) 0.4 MG SL tablet Place 1 tablet (0.4 mg total) under the tongue as needed. (Patient not taking: Reported on 02/02/2023) 25 tablet 3   No current facility-administered medications  for this visit.     Allergies:  Allergies  Allergen Reactions   Tape Hives    Surgical tape - causes blisters   Hydrocodone     Shaking uncontrollably    Iohexol      Desc: PT DOES RECALL REACTION JUST SAYS IT WAS A LONG TIME AGO FOR KIDNEY X-RAYS AND HE DIDN'T TOLERATE IT WELL-ARS 05/02/09-NOTE E-CHART STATES A HOT FEELING IS HIS REACTION, BUT HE CAN'T CONFIRM THAT WAS ALL    Oxycodone Other (See Comments)    Shaking uncontrollably    Review of Systems  Constitutional: Negative.   HENT: Negative.    Eyes: Negative.   Respiratory: Negative.    Cardiovascular: Negative.   Gastrointestinal: Negative.   Genitourinary: Negative.   Musculoskeletal: Negative.   Skin: Negative.   Neurological: Negative.   Endo/Heme/Allergies: Negative.   Psychiatric/Behavioral: Negative.        Physical Exam:  Blood pressure (!) 144/45, pulse 60, temperature 98.2 F (36.8 C), temperature source Oral, resp. rate 20, height '5\' 7"'$  ([id number] m), weight 139 lb (63 kg), SpO2 100 %.  Physical Exam Vitals reviewed.  HENT:     Head: Normocephalic and atraumatic.  Eyes:     Pupils: Pupils are equal, round, and reactive to light.  Cardiovascular:     Rate and Rhythm: Normal rate and regular rhythm.     Heart sounds: Normal heart sounds.  Pulmonary:     Effort: Pulmonary effort is normal.     Breath sounds: Normal breath sounds.  Abdominal:     General: Bowel sounds are normal.     Palpations: Abdomen is soft.  Musculoskeletal:        General: No tenderness or deformity. Normal range of motion.     Cervical back: Normal range of motion.  Lymphadenopathy:     Cervical: No cervical adenopathy.  Skin:    General: Skin is warm and dry.     Findings: No erythema or rash.  Neurological:     Mental Status: He is alert and oriented to person, place, and time.  Psychiatric:        Behavior: Behavior normal.        Thought Content: Thought content normal.        Judgment: Judgment normal.      Lab Results: Lab Results  Component Value Date   WBC 4.8 02/02/2023   HGB 11.9 (L) 02/02/2023   HCT 36.9 (L) 02/02/2023   MCV 95.6 02/02/2023   PLT 125 (L) 02/02/2023     Chemistry      Component Value Date/Time   NA 137 02/02/2023 1026   K 4.5 02/02/2023 1026  CL 108 02/02/2023 1026   CO2 25 02/02/2023 1026   BUN 22 02/02/2023 1026   CREATININE 1.45 (H) 02/02/2023 1026      Component Value Date/Time   CALCIUM 8.3 (L) 02/02/2023 1026   ALKPHOS 84 02/02/2023 1026   AST 27 02/02/2023 1026   ALT 23 02/02/2023 1026   BILITOT 0.4 02/02/2023 1026     Study Result     Impression and Plan:  89 year old man with:   1.  Stage IV clear-cell renal cell carcinoma with left iliopsoas and pulmonary nodules.   He is currently on active surveillance without any evidence of disease progression.  Again, the CT scan does show a slowly growing nodule in the right lung.  For now, this is quite small.  I still think we can follow this along.  I would like to get him back in 3 months.  At some point, we may have to consider another CT scan of the chest.  I agree with Dr. Alen Todd that he certainly is not the greatest candidate for systemic therapy.  His performance status is marginal.  Again, we want quality of life to be a priority.   Volanda Napoleon, MD 2/29/20242:50 PM

## 2023-02-03 ENCOUNTER — Inpatient Hospital Stay (HOSPITAL_BASED_OUTPATIENT_CLINIC_OR_DEPARTMENT_OTHER)
Admission: EM | Admit: 2023-02-03 | Discharge: 2023-02-10 | DRG: 178 | Disposition: A | Discharge status: Rehabilitation Facility | Payer: Medicare Other | Attending: Internal Medicine | Admitting: Internal Medicine

## 2023-02-03 ENCOUNTER — Emergency Department (HOSPITAL_BASED_OUTPATIENT_CLINIC_OR_DEPARTMENT_OTHER): Payer: Medicare Other

## 2023-02-03 ENCOUNTER — Encounter (HOSPITAL_BASED_OUTPATIENT_CLINIC_OR_DEPARTMENT_OTHER): Payer: Self-pay | Admitting: Emergency Medicine

## 2023-02-03 ENCOUNTER — Other Ambulatory Visit: Payer: Self-pay

## 2023-02-03 DIAGNOSIS — U071 COVID-19: Principal | ICD-10-CM | POA: Diagnosis present

## 2023-02-03 DIAGNOSIS — I440 Atrioventricular block, first degree: Secondary | ICD-10-CM | POA: Diagnosis present

## 2023-02-03 DIAGNOSIS — N1831 Chronic kidney disease, stage 3a: Secondary | ICD-10-CM | POA: Diagnosis present

## 2023-02-03 DIAGNOSIS — R059 Cough, unspecified: Secondary | ICD-10-CM | POA: Diagnosis not present

## 2023-02-03 DIAGNOSIS — Z823 Family history of stroke: Secondary | ICD-10-CM

## 2023-02-03 DIAGNOSIS — H548 Legal blindness, as defined in USA: Secondary | ICD-10-CM | POA: Diagnosis present

## 2023-02-03 DIAGNOSIS — Z7982 Long term (current) use of aspirin: Secondary | ICD-10-CM

## 2023-02-03 DIAGNOSIS — F05 Delirium due to known physiological condition: Secondary | ICD-10-CM | POA: Diagnosis not present

## 2023-02-03 DIAGNOSIS — Z808 Family history of malignant neoplasm of other organs or systems: Secondary | ICD-10-CM

## 2023-02-03 DIAGNOSIS — Z66 Do not resuscitate: Secondary | ICD-10-CM | POA: Diagnosis not present

## 2023-02-03 DIAGNOSIS — Z91041 Radiographic dye allergy status: Secondary | ICD-10-CM

## 2023-02-03 DIAGNOSIS — E860 Dehydration: Secondary | ICD-10-CM | POA: Diagnosis present

## 2023-02-03 DIAGNOSIS — M1990 Unspecified osteoarthritis, unspecified site: Secondary | ICD-10-CM | POA: Diagnosis present

## 2023-02-03 DIAGNOSIS — Z955 Presence of coronary angioplasty implant and graft: Secondary | ICD-10-CM

## 2023-02-03 DIAGNOSIS — Z96612 Presence of left artificial shoulder joint: Secondary | ICD-10-CM | POA: Diagnosis present

## 2023-02-03 DIAGNOSIS — D696 Thrombocytopenia, unspecified: Secondary | ICD-10-CM | POA: Diagnosis present

## 2023-02-03 DIAGNOSIS — R630 Anorexia: Secondary | ICD-10-CM | POA: Diagnosis present

## 2023-02-03 DIAGNOSIS — R001 Bradycardia, unspecified: Secondary | ICD-10-CM | POA: Diagnosis not present

## 2023-02-03 DIAGNOSIS — Z923 Personal history of irradiation: Secondary | ICD-10-CM

## 2023-02-03 DIAGNOSIS — R54 Age-related physical debility: Secondary | ICD-10-CM | POA: Diagnosis present

## 2023-02-03 DIAGNOSIS — E871 Hypo-osmolality and hyponatremia: Secondary | ICD-10-CM | POA: Diagnosis present

## 2023-02-03 DIAGNOSIS — I10 Essential (primary) hypertension: Secondary | ICD-10-CM | POA: Diagnosis present

## 2023-02-03 DIAGNOSIS — S2242XA Multiple fractures of ribs, left side, initial encounter for closed fracture: Secondary | ICD-10-CM | POA: Diagnosis not present

## 2023-02-03 DIAGNOSIS — R0602 Shortness of breath: Secondary | ICD-10-CM | POA: Diagnosis not present

## 2023-02-03 DIAGNOSIS — Z905 Acquired absence of kidney: Secondary | ICD-10-CM

## 2023-02-03 DIAGNOSIS — Z79899 Other long term (current) drug therapy: Secondary | ICD-10-CM

## 2023-02-03 DIAGNOSIS — Z8546 Personal history of malignant neoplasm of prostate: Secondary | ICD-10-CM

## 2023-02-03 DIAGNOSIS — Z85528 Personal history of other malignant neoplasm of kidney: Secondary | ICD-10-CM

## 2023-02-03 DIAGNOSIS — R42 Dizziness and giddiness: Secondary | ICD-10-CM | POA: Diagnosis not present

## 2023-02-03 DIAGNOSIS — Z885 Allergy status to narcotic agent status: Secondary | ICD-10-CM

## 2023-02-03 DIAGNOSIS — C7801 Secondary malignant neoplasm of right lung: Secondary | ICD-10-CM | POA: Diagnosis present

## 2023-02-03 DIAGNOSIS — H3530 Unspecified macular degeneration: Secondary | ICD-10-CM | POA: Diagnosis present

## 2023-02-03 DIAGNOSIS — Z888 Allergy status to other drugs, medicaments and biological substances status: Secondary | ICD-10-CM

## 2023-02-03 DIAGNOSIS — E785 Hyperlipidemia, unspecified: Secondary | ICD-10-CM | POA: Diagnosis present

## 2023-02-03 DIAGNOSIS — N183 Chronic kidney disease, stage 3 unspecified: Secondary | ICD-10-CM | POA: Diagnosis present

## 2023-02-03 DIAGNOSIS — Z515 Encounter for palliative care: Secondary | ICD-10-CM

## 2023-02-03 DIAGNOSIS — I252 Old myocardial infarction: Secondary | ICD-10-CM

## 2023-02-03 DIAGNOSIS — C649 Malignant neoplasm of unspecified kidney, except renal pelvis: Secondary | ICD-10-CM | POA: Diagnosis present

## 2023-02-03 DIAGNOSIS — I129 Hypertensive chronic kidney disease with stage 1 through stage 4 chronic kidney disease, or unspecified chronic kidney disease: Secondary | ICD-10-CM | POA: Diagnosis present

## 2023-02-03 DIAGNOSIS — I444 Left anterior fascicular block: Secondary | ICD-10-CM | POA: Diagnosis present

## 2023-02-03 DIAGNOSIS — I2510 Atherosclerotic heart disease of native coronary artery without angina pectoris: Secondary | ICD-10-CM | POA: Diagnosis not present

## 2023-02-03 DIAGNOSIS — Z87891 Personal history of nicotine dependence: Secondary | ICD-10-CM

## 2023-02-03 LAB — CBC WITH DIFFERENTIAL/PLATELET
Abs Immature Granulocytes: 0.02 10*3/uL (ref 0.00–0.07)
Basophils Absolute: 0 10*3/uL (ref 0.0–0.1)
Basophils Relative: 0 %
Eosinophils Absolute: 0 10*3/uL (ref 0.0–0.5)
Eosinophils Relative: 0 %
HCT: 34.5 % — ABNORMAL LOW (ref 39.0–52.0)
Hemoglobin: 11.2 g/dL — ABNORMAL LOW (ref 13.0–17.0)
Immature Granulocytes: 0 %
Lymphocytes Relative: 10 %
Lymphs Abs: 0.5 10*3/uL — ABNORMAL LOW (ref 0.7–4.0)
MCH: 30.4 pg (ref 26.0–34.0)
MCHC: 32.5 g/dL (ref 30.0–36.0)
MCV: 93.5 fL (ref 80.0–100.0)
Monocytes Absolute: 0.7 10*3/uL (ref 0.1–1.0)
Monocytes Relative: 13 %
Neutro Abs: 4 10*3/uL (ref 1.7–7.7)
Neutrophils Relative %: 77 %
Platelets: 91 10*3/uL — ABNORMAL LOW (ref 150–400)
RBC: 3.69 MIL/uL — ABNORMAL LOW (ref 4.22–5.81)
RDW: 13.2 % (ref 11.5–15.5)
WBC: 5.2 10*3/uL (ref 4.0–10.5)
nRBC: 0 % (ref 0.0–0.2)

## 2023-02-03 LAB — RESP PANEL BY RT-PCR (RSV, FLU A&B, COVID)  RVPGX2
Influenza A by PCR: NEGATIVE
Influenza B by PCR: NEGATIVE
Resp Syncytial Virus by PCR: NEGATIVE
SARS Coronavirus 2 by RT PCR: POSITIVE — AB

## 2023-02-03 LAB — COMPREHENSIVE METABOLIC PANEL
ALT: 26 U/L (ref 0–44)
AST: 28 U/L (ref 15–41)
Albumin: 3.4 g/dL — ABNORMAL LOW (ref 3.5–5.0)
Alkaline Phosphatase: 69 U/L (ref 38–126)
Anion gap: 6 (ref 5–15)
BUN: 23 mg/dL (ref 8–23)
CO2: 25 mmol/L (ref 22–32)
Calcium: 8.1 mg/dL — ABNORMAL LOW (ref 8.9–10.3)
Chloride: 105 mmol/L (ref 98–111)
Creatinine, Ser: 1.32 mg/dL — ABNORMAL HIGH (ref 0.61–1.24)
GFR, Estimated: 51 mL/min — ABNORMAL LOW (ref >60)
Glucose, Bld: 105 mg/dL — ABNORMAL HIGH (ref 70–99)
Potassium: 4.3 mmol/L (ref 3.5–5.1)
Sodium: 136 mmol/L (ref 135–145)
Total Bilirubin: 0.8 mg/dL (ref 0.3–1.2)
Total Protein: 6.5 g/dL (ref 6.5–8.1)

## 2023-02-03 LAB — TSH: TSH: 1.084 u[IU]/mL (ref 0.350–4.500)

## 2023-02-03 LAB — LACTIC ACID, PLASMA: Lactic Acid, Venous: 1.4 mmol/L (ref 0.5–1.9)

## 2023-02-03 LAB — ERYTHROPOIETIN: Erythropoietin: 10.4 m[IU]/mL (ref 2.6–18.5)

## 2023-02-03 MED ORDER — METOPROLOL SUCCINATE ER 25 MG PO TB24
25.0000 mg | ORAL_TABLET | Freq: Every day | ORAL | Status: DC
  Filled 2023-02-03: qty 1

## 2023-02-03 MED ORDER — GUAIFENESIN-DM 100-10 MG/5ML PO SYRP
5.0000 mL | ORAL_SOLUTION | ORAL | Status: DC | PRN
  Administered 2023-02-03 – 2023-02-06 (×4): 5 mL via ORAL
  Filled 2023-02-03 (×6): qty 5

## 2023-02-03 MED ORDER — BENZONATATE 100 MG PO CAPS: 200.0000 mg | ORAL_CAPSULE | Freq: Once | ORAL | Status: DC

## 2023-02-03 MED ORDER — SODIUM CHLORIDE 0.9 % IV SOLN: INTRAVENOUS | Status: AC

## 2023-02-03 MED ORDER — ACETAMINOPHEN 500 MG PO TABS
1000.0000 mg | ORAL_TABLET | Freq: Four times a day (QID) | ORAL | Status: DC | PRN
  Administered 2023-02-03: 1000 mg via ORAL
  Filled 2023-02-03: qty 2

## 2023-02-03 MED ORDER — METOPROLOL SUCCINATE ER 25 MG PO TB24: 25.0000 mg | ORAL_TABLET | Freq: Every day | ORAL | Status: DC

## 2023-02-03 MED ORDER — ONDANSETRON HCL 4 MG/2ML IJ SOLN: 4.0000 mg | Freq: Four times a day (QID) | INTRAMUSCULAR | Status: DC | PRN

## 2023-02-03 MED ORDER — IPRATROPIUM BROMIDE 0.03 % NA SOLN: 1.0000 | Freq: Two times a day (BID) | NASAL | Status: DC | PRN

## 2023-02-03 MED ORDER — NIRMATRELVIR/RITONAVIR (PAXLOVID) TABLET (RENAL DOSING)
2.0000 | ORAL_TABLET | Freq: Two times a day (BID) | ORAL | Status: AC
  Administered 2023-02-03 – 2023-02-08 (×10): 2 via ORAL
  Filled 2023-02-03: qty 20

## 2023-02-03 MED ORDER — SODIUM CHLORIDE 0.9 % IV BOLUS
500.0000 mL | Freq: Once | INTRAVENOUS | Status: AC
  Administered 2023-02-03: 500 mL via INTRAVENOUS

## 2023-02-03 MED ORDER — AMLODIPINE BESYLATE 10 MG PO TABS
10.0000 mg | ORAL_TABLET | Freq: Every day | ORAL | Status: DC
  Administered 2023-02-03: 10 mg via ORAL
  Filled 2023-02-03: qty 1

## 2023-02-03 MED ORDER — NIRMATRELVIR/RITONAVIR (PAXLOVID)TABLET: 3.0000 | ORAL_TABLET | Freq: Two times a day (BID) | ORAL | Status: DC

## 2023-02-03 MED ORDER — MECLIZINE HCL 25 MG PO TABS
25.0000 mg | ORAL_TABLET | ORAL | Status: DC | PRN
  Administered 2023-02-03 – 2023-02-08 (×4): 25 mg via ORAL
  Filled 2023-02-03 (×4): qty 1

## 2023-02-03 MED ORDER — PROSIGHT PO TABS
1.0000 | ORAL_TABLET | Freq: Two times a day (BID) | ORAL | Status: DC
  Administered 2023-02-03 – 2023-02-10 (×13): 1 via ORAL
  Filled 2023-02-03 (×13): qty 1

## 2023-02-03 MED ORDER — MENTHOL 3 MG MT LOZG
1.0000 | LOZENGE | Freq: Three times a day (TID) | OROMUCOSAL | Status: DC
  Administered 2023-02-03 – 2023-02-10 (×19): 3 mg via ORAL
  Filled 2023-02-03 (×14): qty 9

## 2023-02-03 MED ORDER — ASPIRIN 81 MG PO TBEC
81.0000 mg | DELAYED_RELEASE_TABLET | Freq: Every day | ORAL | Status: DC
  Administered 2023-02-03 – 2023-02-09 (×6): 81 mg via ORAL
  Filled 2023-02-03 (×6): qty 1

## 2023-02-03 MED ORDER — CALCIUM CARBONATE ANTACID 500 MG PO CHEW: 1000.0000 mg | CHEWABLE_TABLET | Freq: Every day | ORAL | Status: DC | PRN

## 2023-02-03 MED ORDER — LORATADINE 10 MG PO TABS
10.0000 mg | ORAL_TABLET | Freq: Every day | ORAL | Status: DC
  Administered 2023-02-03 – 2023-02-10 (×8): 10 mg via ORAL
  Filled 2023-02-03 (×8): qty 1

## 2023-02-03 MED ORDER — AMLODIPINE BESYLATE 5 MG PO TABS
5.0000 mg | ORAL_TABLET | Freq: Every day | ORAL | Status: DC
  Administered 2023-02-04: 5 mg via ORAL
  Filled 2023-02-03: qty 1

## 2023-02-03 MED ORDER — NITROGLYCERIN 0.4 MG SL SUBL: 0.4000 mg | SUBLINGUAL_TABLET | SUBLINGUAL | Status: DC | PRN

## 2023-02-03 MED ORDER — PHENOL 1.4 % MT LIQD
1.0000 | OROMUCOSAL | Status: DC | PRN
  Administered 2023-02-04: 1 via OROMUCOSAL
  Filled 2023-02-03: qty 177

## 2023-02-03 MED ORDER — ONDANSETRON HCL 4 MG PO TABS: 4.0000 mg | ORAL_TABLET | Freq: Four times a day (QID) | ORAL | Status: DC | PRN

## 2023-02-03 MED ORDER — ATORVASTATIN CALCIUM 40 MG PO TABS
40.0000 mg | ORAL_TABLET | Freq: Every day | ORAL | Status: DC
  Administered 2023-02-09: 40 mg via ORAL
  Filled 2023-02-03: qty 1

## 2023-02-03 MED ORDER — ENOXAPARIN SODIUM 40 MG/0.4ML IJ SOSY: 40.0000 mg | PREFILLED_SYRINGE | INTRAMUSCULAR | Status: DC

## 2023-02-03 MED ORDER — ENALAPRIL MALEATE 10 MG PO TABS
10.0000 mg | ORAL_TABLET | Freq: Every day | ORAL | Status: DC
  Administered 2023-02-03 – 2023-02-04 (×2): 10 mg via ORAL
  Filled 2023-02-03 (×2): qty 1

## 2023-02-03 NOTE — Assessment & Plan Note (Signed)
Stable Continue aspirin and atorvastatin Hold metoprolol due to bradycardia

## 2023-02-03 NOTE — ED Triage Notes (Signed)
Per spouse pt had cough x 2 days , poor appetite , sore throat . Lethargic. Dizzy.

## 2023-02-03 NOTE — ED Notes (Signed)
Room assignment at [facility] given to wife, Jasn Venecia

## 2023-02-03 NOTE — ED Provider Notes (Signed)
[facility] Provider Note   CSN: [id number] Arrival date & time: 02/03/23  [id number]     History  Chief Complaint  Patient presents with   Cough    Donald Todd is a 89 y.o. male, history renal cell Carcinoma, who presents to the ED secondary to shortness of breath, productive cough, low-grade fever, sore throat for the last couple days.  He states that he has had a cough that has kept him up all at last night, and he has been unable to sleep.  Also routine reports reduced appetite, increased fatigue, and reduced p.o. intake.  He states he just feels weak and unwell.  Denies any chest pain, abdominal pain, nausea, vomiting, diarrhea. Also states he feels a bit dizzy, worse when getting up. Denies any recent falls.    Home Medications Prior to Admission medications   Medication Sig Start Date End Date Taking? Authorizing Provider  acetaminophen (TYLENOL) 500 MG tablet Take 1,000 mg by mouth every 6 (six) hours as needed for moderate pain.    [provider]  amLODipine (NORVASC) 10 MG tablet TAKE 1 TABLET DAILY 07/29/22   Lorretta Harp, MD  amoxicillin (AMOXIL) 500 MG tablet Take 2,000 mg by mouth See admin instructions. Take 2000 mg 1 hour prior to dental work Patient not taking: Reported on 02/02/2023    [provider]  aspirin 81 MG tablet Take 81 mg by mouth at bedtime.    [provider]  atorvastatin (LIPITOR) 40 MG tablet TAKE 1 TABLET AT BEDTIME 04/13/22   Lorretta Harp, MD  calcium carbonate (TUMS - DOSED IN MG ELEMENTAL CALCIUM) 500 MG chewable tablet Chew 1,000 mg by mouth daily as needed for indigestion or heartburn.    [provider]  docusate sodium (COLACE) 100 MG capsule Take 1 capsule (100 mg total) by mouth every 12 (twelve) hours. Patient taking differently: Take 200 mg by mouth daily. [id number]   Delora Fuel, MD  enalapril (VASOTEC) 10 MG tablet TAKE 1 TABLET DAILY 08/12/22   Lorretta Harp, MD  ipratropium (ATROVENT) 0.03 % nasal spray Place 1-2 sprays into both nostrils 2 (two) times daily as needed (allergies). 05/29/20   [provider]  loratadine (CLARITIN) 10 MG tablet Take 10 mg by mouth daily. 09/29/21   [provider]  meclizine (ANTIVERT) 25 MG tablet 1 tablet as needed    [provider]  metoprolol succinate (TOPROL-XL) 25 MG 24 hr tablet TAKE 1 TABLET DAILY 12/27/22   Lorretta Harp, MD  Multiple Vitamins-Minerals (PRESERVISION AREDS 2) CAPS Take 1 capsule by mouth 2 (two) times daily.    [provider]  nitroGLYCERIN (NITROSTAT) 0.4 MG SL tablet Place 1 tablet (0.4 mg total) under the tongue as needed. Patient not taking: Reported on 02/02/2023 05/10/21   Lorretta Harp, MD  polyethylene glycol (MIRALAX / GLYCOLAX) 17 g packet Take 17 g by mouth daily. [id number]   Delora Fuel, MD      Allergies    Tape, Hydrocodone, Iohexol, and Oxycodone    Review of Systems   Review of Systems  Respiratory:  Positive for cough and shortness of breath.   Cardiovascular:  Negative for chest pain.    Physical Exam Updated Vital Signs BP (!) 153/63   Pulse (!) 53   Temp 99.5 F (37.5 C)   Resp 16   Ht '5\' 7"'$  (1.702 m)   Wt 62.6 kg   SpO2  98%   BMI 21.61 kg/m  Physical Exam Vitals and nursing note reviewed.  Constitutional:      General: He is not in acute distress.    Appearance: He is well-developed.     Comments: Frail appearing  HENT:     Head: Normocephalic and atraumatic.     Mouth/Throat:     Mouth: Mucous membranes are dry.  Eyes:     Conjunctiva/sclera: Conjunctivae normal.  Cardiovascular:     Rate and Rhythm: Normal rate and regular rhythm.     Heart sounds: No murmur heard. Pulmonary:     Effort: Pulmonary effort is normal. No respiratory distress.     Breath sounds: Examination of the left-lower field reveals rales. Rales present.  Abdominal:     Palpations: Abdomen is soft.     Tenderness: There  is no abdominal tenderness.  Musculoskeletal:        General: No swelling.     Cervical back: Neck supple.  Skin:    General: Skin is warm and dry.     Capillary Refill: Capillary refill takes less than 2 seconds.  Neurological:     Mental Status: He is alert.  Psychiatric:        Mood and Affect: Mood normal.     ED Results / Procedures / Treatments   Labs (all labs ordered are listed, but only abnormal results are displayed) Labs Reviewed  RESP PANEL BY RT-PCR (RSV, FLU A&B, COVID)  RVPGX2 - Abnormal; Notable for the following components:      Result Value   SARS Coronavirus 2 by RT PCR POSITIVE (*)    All other components within normal limits  COMPREHENSIVE METABOLIC PANEL - Abnormal; Notable for the following components:   Glucose, Bld 105 (*)    Creatinine, Ser 1.32 (*)    Calcium 8.1 (*)    Albumin 3.4 (*)    GFR, Estimated 51 (*)    All other components within normal limits  CBC WITH DIFFERENTIAL/PLATELET - Abnormal; Notable for the following components:   RBC 3.69 (*)    Hemoglobin 11.2 (*)    HCT 34.5 (*)    Platelets 91 (*)    Lymphs Abs 0.5 (*)    All other components within normal limits  LACTIC ACID, PLASMA    EKG EKG Interpretation  Date/Time:  Friday February 03 2023 09:46:26 EST Ventricular Rate:  58 PR Interval:  249 QRS Duration: 90 QT Interval:  421 QTC Calculation: 414 R Axis:   -73 Text Interpretation: Sinus rhythm Atrial premature complex Prolonged PR interval Left anterior fascicular block RSR' in V1 or V2, right VCD or RVH Nonspecific T abnrm, anterolateral leads when compared to prior, similar appearance. No STEMI Confirmed by Antony Blackbird [phone]) on 02/03/2023 9:47:20 AM  Radiology DG Chest 2 View  Result Date: 02/03/2023 CLINICAL DATA:  Short of breath.  Cough. EXAM: CHEST - 2 VIEW COMPARISON:  CT 02/01/2023. FINDINGS: Lateral view degraded by patient arm position. Right shoulder osteoarthritis with high-riding humeral head indicative of  chronic rotator cuff insufficiency. Left shoulder arthroplasty. Right hemidiaphragm elevation. Multiple leads and wires project over the chest on the frontal view. Midline trachea. Normal heart size. Atherosclerosis in the transverse aorta. No pleural effusion or pneumothorax. No congestive failure. Clear lungs. Remote left rib fractures. IMPRESSION: No acute cardiopulmonary disease. Electronically Signed   By: Abigail Miyamoto M.D.   On: 02/03/2023 10:02   CT CHEST ABDOMEN PELVIS WO CONTRAST  Result Date: 02/02/2023 CLINICAL DATA:  A 89 year old male presents for evaluation of renal cell carcinoma recurrence. * Tracking Code: BO * EXAM: CT CHEST, ABDOMEN AND PELVIS WITHOUT CONTRAST TECHNIQUE: Multidetector CT imaging of the chest, abdomen and pelvis was performed following the standard protocol without IV contrast. RADIATION DOSE REDUCTION: This exam was performed according to the departmental dose-optimization program which includes automated exposure control, adjustment of the mA and/or kV according to patient size and/or use of iterative reconstruction technique. COMPARISON:  August 01, 2022 FINDINGS: CT CHEST FINDINGS Cardiovascular: Stable appearance of the heart great vessels with calcified coronary artery disease, mild to moderate cardiomegaly and calcified aortic atherosclerosis. Heart with mass effect from moderate to marked pectus deformity similar to prior imaging as well. Mediastinum/Nodes: No thoracic inlet, axillary, mediastinal or hilar adenopathy. Esophagus grossly normal. Lungs/Pleura: Branching nodularity in the anterior LEFT upper lobe is stable potentially mucoid impaction, unchanged again since August of 2023 in total on single axial image measuring 10 x 6 mm, essentially stable dating back to 2022 as well. Slowly increasing size of a nodule in the anterior RIGHT middle lobe 11 x 9 mm, within 1 mm of most recent comparison imaging enlarged from 9 x 8 mm in April of 2022. Parenchymal distortion  in the RIGHT chest with similar appearance. Biapical pleural and parenchymal scarring. Airways are patent. No pleural effusion. Stable Peri fissural nodule on image 64/4 in the RIGHT upper lobe 6 mm. Musculoskeletal: See below for full musculoskeletal details. No chest wall mass. Signs of pectus excavatum. CT ABDOMEN PELVIS FINDINGS Hepatobiliary: Lobular hepatic contours similar to prior imaging with changes of cholecystectomy. Pancreas: Pancreas with normal contours, no signs of inflammation or peripancreatic fluid. Spleen: Normal size and contour. Adrenals/Urinary Tract: Post LEFT nephrectomy and adrenalectomy. Stable low-density RIGHT adrenal lesion measuring 17 mm, unchanged since 2022. Compatible with RIGHT adrenal adenoma. Low-density RIGHT renal lesion measuring water density with homogeneous appearance compatible with a cyst 3.4 cm also without change. No ureteral dilation or perivesical stranding. Stomach/Bowel: Unchanged herniation of the colon into the abdominal wall laxity/flank hernia on the LEFT flank with broad-based defect in abdominal wall musculature, overlying external oblique may be intact and again findings are not changed. There is moderate to marked gastric distension which is little changed and without adjacent stranding. There is no sign of Ceana Fiala bowel obstruction or inflammation. Appendix not visible, no secondary signs to suggest acute appendiceal process. Colonic diverticulosis. Vascular/Lymphatic: Marked calcified aortic atherosclerosis without aneurysm. Ill-defined loss of normal muscular architecture about the LEFT L1 transverse process associated with the psoas muscle is stable. No discrete adenopathy in the retroperitoneum. No adenopathy of the pelvis. Reproductive: Brachytherapy seeds in the prostate. Other: No ascites. Musculoskeletal: No change in the appearance of site of known muscular metastatic lesion. Spinal degenerative changes. Post LEFT glenohumeral joint arthroplasty  with reverse shoulder arthroplasty. No destructive bone process. IMPRESSION: 1. Slowly increasing size of a nodule in the anterior RIGHT middle lobe, within 1 mm of most recent comparison imaging mildly enlarged from 9 x 8 mm in April of 2022. Stable appearance of Jaquann Guarisco upper lobe nodules potentially post infectious or inflammatory in the LEFT upper lobe. Perhaps minimally increased size of the RIGHT upper lobe nodule since 2022 but without change in the short interval compared to the August 2023 evaluation. 2. Unchanged appearance of site of known intramuscular metastatic process following LEFT nephrectomy. 3. No new findings. 4. Aortic atherosclerosis and coronary artery disease. 5. Colonic diverticulosis Aortic Atherosclerosis (ICD10-I70.0). Electronically Signed   By: Cay Schillings  Wile M.D.   On: 02/02/2023 11:35    Procedures Procedures    Medications Ordered in ED Medications  benzonatate (TESSALON) capsule 200 mg (has no administration in time range)  sodium chloride 0.9 % bolus 500 mL (500 mLs Intravenous New Bag/Given 02/03/23 1020)    ED Course/ Medical Decision Making/ A&P                             Medical Decision Making Patient is a 89 year old male, history of renal cell carcinoma, who presents to the ED secondary to shortness of breath, productive cough, low-grade fever, sore throat for the last couple days.  We will obtain a chest x-ray, lactic acid, given his age, including CBC, CMP, and COVID/flu for further evaluation.  As well as given fluids since he is not eating or drinking.  Amount and/or Complexity of Data Reviewed Labs: ordered.    Details: COVID-positive Radiology: ordered.    Details: Chest x-ray shows some rib fractures no evidence of Discussion of management or test interpretation with external provider(s): Patient is elderly, 89 years old, history of cancer, frail-appearing, with productive cough, and crackles in left lower lobe, chest x-ray shows no evidence of  pneumonia, however he is positive for COVID.  His saturations have been intermittently dipping to 90%, and he is ill-appearing.  Given his risk factors, oxygen saturations, and possible new onset rib fractures per chest x-ray, we will admit to hospitalist, for further evaluation.  He is at high risk for developing pneumonia given the rib fractures, and elderly age.  He is not eating or drinking either, which is a cause of concern. Admitted to Dr. Francine Graven for observation.   Risk Prescription drug management. Decision regarding hospitalization.   Final Clinical Impression(s) / ED Diagnoses Final diagnoses:  COVID-19  Closed fracture of multiple ribs of left side, initial encounter    Rx / DC Orders ED Discharge Orders     None         Osvaldo Shipper, [place] 02/03/23 1118    Tegeler, Gwenyth Allegra, MD 02/03/23 1245

## 2023-02-03 NOTE — Assessment & Plan Note (Signed)
Most likely secondary to acute viral illness Platelet count today is 91,000 compared to baseline of 1 25,000 Hold Lovenox SCD for DVT prophylaxis Monitor closely for bleeding

## 2023-02-03 NOTE — H&P (Addendum)
History and Physical    Patient: Donald Todd [id number] DOB: 1929-03-21 DOA: 02/03/2023 DOS: the patient was seen and examined on 02/03/2023 PCP: [name], MD  Patient coming from: Home  Chief Complaint:  Chief Complaint  Patient presents with   Cough   HPI: Donald Todd is a 89 y.o. 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 Patient states his symptoms started 2 days ago with severe sore throat.  He has a cough that is nonproductive and complains of feeling very weak and anorexic.  He denies having any sick contacts, no nausea, no vomiting, no abdominal pain, no fever, no chills, no myalgias, no changes in his bowel habits.  He complains of feeling dizzy, not related to movement and recalls being at rest and with movement.  He denies having any tinnitus and has not had a recent fall. His SARS coronavirus 2 PCR is positive CT scan of chest/abdomen/pelvis shows slowly increasing size of a nodule in the anterior RIGHT middle lobe, within 1 mm of most recent comparison imaging mildly enlarged from 9 x 8 mm in April of 2022. Stable appearance of small upper lobe nodules potentially post infectious or inflammatory in the LEFT upper lobe. Perhaps minimally increased size of the RIGHT upper lobe nodule since 2022 but without change in the short interval compared to the August 2023 evaluation. Unchanged appearance of site of known intramuscular metastatic process following LEFT nephrectomy. No new findings. Aortic atherosclerosis and coronary artery disease. Colonic diverticulosis Chest x-ray reviewed by me shows no  evidence of acute cardiopulmonary disease Per ER physician, patient had room air pulse oximetry at rest of 90% and he was concerned for possible worsening of patient's condition. He will be referred to observation status for further evaluation.   Review of Systems: As mentioned in the history of present illness. All other systems reviewed and are negative. Past Medical History:  Diagnosis Date   Arthritis    CAD (coronary artery disease)    last cath. 07-09-2010   Cancer [facility])    kidney cancer , prostate cancer    Chronic kidney disease    lt kidnet removed   H/O cardiovascular stress test 08-04-2010   ef 55% NO ISCHEMIA   H/O unilateral nephrectomy    lt. nephrectomy   Heart attack ([place]) 1993   History of kidney stones    Hyperlipemia    Hypertension    renal dopplers 218.13-normal patency   Inguinal hernia 08/01/11   right   Macular degeneration    Prostate cancer [facility])    Prostate disease    Cancer S/P seed implant   Renal mass    Vertigo    Past Surgical History:  Procedure Laterality Date   ANGIOPLASTY  1993   athroscopic knee surgery  2002   right   bone scan  2005   CARDIAC CATHETERIZATION  07-09-2010   normal left ventricular function, coronary obsstructive disease with 20% proximal an 30-40% mid lt. anterior descending narrowing ; widely patent stent in the proximal ramus intermediate vessel; 50-70-% narrowing in the mid circumflex,and widely patent stent  in the rt. coronary    CARDIAC CATHETERIZATION  12-22-2003   widely patent stents  with noncritical coronary artery disease and normalLV function   CHOLECYSTECTOMY  05/05/2009   Laparoscopic   colonscopy  2004   with biopsy   CORONARY ANGIOPLASTY WITH STENT PLACEMENT  03-15-1996   pci to rci and ramus branch using j&j [id number] stent,post dialated  at  20atm (rca)  and 14 atm (ramus branch. ef 55%             HERNIA REPAIR  08/01/11   RIH   INGUINAL HERNIA REPAIR Left 03/29/2013   Procedure: HERNIA REPAIR INGUINAL  ADULT;  Surgeon: Edward Jolly, MD;  Location: [facility];  Service: General;  Laterality: Left;   INSERTION OF MESH Left 03/29/2013   Procedure: INSERTION OF MESH;  Surgeon: Edward Jolly, MD;  Location: [facility];  Service: General;  Laterality: Left;   JOINT REPLACEMENT  2006 and 2010   left 2006, right 2010   KIDNEY SURGERY  2002 and 2005   2002 - partial removal of left. 2005 complete removal   PROSTATE BIOPSY     prostate seed implant     radiation treatment  2002   prostate   REVERSE SHOULDER ARTHROPLASTY Left 05/26/2017   Procedure: REVERSE LEFT TOTAL SHOULDER ARTHROPLASTY;  Surgeon: Netta Cedars, MD;  Location: [place];  Service: Orthopedics;  Laterality: Left;   stent implants  1997   Social History:  reports that he quit smoking about 59 years ago. His smoking use included cigarettes and cigars. He has a 3.25 pack-year smoking history. He has never used smokeless tobacco. He reports that he does not drink alcohol and does not use drugs.  Allergies  Allergen Reactions   Tape Hives    Surgical tape - causes blisters   Hydrocodone     Shaking uncontrollably    Iohexol      Desc: PT DOES RECALL REACTION JUST SAYS IT WAS A LONG TIME AGO FOR KIDNEY X-RAYS AND HE DIDN'T TOLERATE IT WELL-ARS 05/02/09-NOTE E-CHART STATES A HOT FEELING IS HIS REACTION, BUT HE CAN'T CONFIRM THAT WAS ALL    Oxycodone Other (See Comments)    Shaking uncontrollably     Family History  Problem Relation Age of Onset   Cancer Mother        stomach?   Stroke Father    Stroke Brother    Skin cancer Brother     Prior to Admission medications   Medication Sig Start Date End Date Taking? Authorizing Provider  acetaminophen (TYLENOL) 500 MG tablet Take 1,000 mg by mouth every 6 (six) hours as needed for moderate pain.   Yes [provider]  amLODipine (NORVASC) 10 MG tablet TAKE 1 TABLET DAILY 07/29/22  Yes Lorretta Harp, MD  aspirin 81 MG tablet Take 81 mg by mouth at bedtime.   Yes  [provider]  atorvastatin (LIPITOR) 40 MG tablet TAKE 1 TABLET AT BEDTIME 04/13/22  Yes Lorretta Harp, MD  calcium carbonate (TUMS - DOSED IN MG ELEMENTAL CALCIUM) 500 MG chewable tablet Chew 1,000 mg by mouth daily as needed for indigestion or heartburn.   Yes [provider]  docusate sodium (COLACE) 100 MG capsule Take 1 capsule (100 mg total) by mouth every 12 (twelve) hours. Patient taking differently: Take 200 mg by mouth daily. [id number]  Yes Delora Fuel, MD  enalapril (VASOTEC) 10 MG tablet TAKE 1 TABLET DAILY 08/12/22  Yes Lorretta Harp, MD  ipratropium (ATROVENT) 0.03 % nasal spray Place 1-2 sprays into both nostrils 2 (two) times daily  as needed (allergies). 05/29/20  Yes [provider]  loratadine (CLARITIN) 10 MG tablet Take 10 mg by mouth daily. 09/29/21  Yes [provider]  meclizine (ANTIVERT) 25 MG tablet Take 25 mg by mouth as needed for dizziness.   Yes [provider]  metoprolol succinate (TOPROL-XL) 25 MG 24 hr tablet TAKE 1 TABLET DAILY Patient taking differently: Take 25 mg by mouth daily. 12/27/22  Yes Lorretta Harp, MD  Multiple Vitamins-Minerals (PRESERVISION AREDS 2) CAPS Take 1 capsule by mouth 2 (two) times daily.   Yes [provider]  nitroGLYCERIN (NITROSTAT) 0.4 MG SL tablet Place 1 tablet (0.4 mg total) under the tongue as needed. 05/10/21  Yes Lorretta Harp, MD  polyethylene glycol (MIRALAX / GLYCOLAX) 17 g packet Take 17 g by mouth daily. Patient not taking: Reported on [id number]   Delora Fuel, MD    Physical Exam: Vitals:   02/03/23 1027 02/03/23 1245 02/03/23 1354 02/03/23 1438  BP:  138/74 (!) 142/74 135/63  Pulse:  (!) 52 (!) 54 63  Resp:  19 (!) 21 18  Temp:   98.9 F (37.2 C) 99.4 F (37.4 C)  TempSrc:   Oral Oral  SpO2:  95% 97% 98%  Weight: 62.6 kg     Height: '5\' 7"'$  (1.702 m)      Physical Exam Vitals and nursing note reviewed.  Constitutional:      Appearance:  Normal appearance.  HENT:     Head: Normocephalic and atraumatic.     Nose: Nose normal.     Mouth/Throat:     Mouth: Mucous membranes are dry.  Eyes:     Conjunctiva/sclera: Conjunctivae normal.  Cardiovascular:     Rate and Rhythm: Bradycardia present.  Pulmonary:     Effort: Pulmonary effort is normal.     Breath sounds: Normal breath sounds.  Abdominal:     General: Abdomen is flat. Bowel sounds are normal.     Palpations: Abdomen is soft.  Musculoskeletal:        General: Normal range of motion.     Cervical back: Normal range of motion and neck supple.  Skin:    General: Skin is warm and dry.  Neurological:     Mental Status: He is alert and oriented to person, place, and time.     Motor: Weakness present.  Psychiatric:        Mood and Affect: Mood normal.        Behavior: Behavior normal.     Data Reviewed: Relevant notes from primary care and specialist visits, past discharge summaries as available in EHR, including Care Everywhere. Prior diagnostic testing as pertinent to current admission diagnoses Updated medications and problem lists for reconciliation ED course, including vitals, labs, imaging, treatment and response to treatment Triage notes, nursing and pharmacy notes and ED provider's notes Notable results as noted in HPI Labs reviewed.  White count 5.2, hemoglobin 11.2, hematocrit 34.5, platelet count 91,000, sodium 136, potassium 4.3, chloride 105, bicarb 25, glucose 105, BUN 23, creatinine 1.32, calcium 8.1, total protein 6.5, albumin 3.4, AST 28, ALT 26, alkaline phosphatase 69, total bilirubin 0.8, LDH 172 Twelve-lead EKG reviewed by me shows sinus rhythm, prolonged PR interval, PACs There are no new results to review at this time.  Assessment and Plan: * COVID-19 virus infection Patient admitted to the hospital for COVID-19 viral infection Was initially thought to be hypoxic with room air pulse oximetry of 90% at rest Check pulse oximetry every  shift Start patient on Paxlovid  Thrombocytopenia ([place]) Most likely secondary to acute viral illness Platelet count today is 91,000 compared to baseline of 1 25,000 Hold Lovenox SCD for DVT prophylaxis Monitor closely for bleeding  Bradycardia with 51-60 beats per minute Probably symptomatic as patient complains of dizziness Noted to have heart rate in the low 50s Twelve-lead EKG shows a prolonged PR interval Hold metoprolol Place patient on cardiac monitor Consult cardiology  Chronic renal impairment, stage 3 (moderate) (HCC) Chronic and stable Monitor closely during this hospitalization  History of nephrectomy- Lt Patient with a 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  All questions and concerns have been addressed.  He verbalizes understanding and agrees with the plan.  CODE STATUS was discussed and he wishes to be full code  Severity of Illness: The appropriate patient status for this patient is OBSERVATION. Observation status is judged to be reasonable and necessary in order to provide the required intensity of service to ensure the patient's safety. The patient's presenting symptoms, physical exam findings, and initial radiographic and laboratory data in the context of their medical condition is felt to place them at decreased risk for further clinical deterioration. Furthermore, it is anticipated that the patient will be medically stable for discharge from the hospital within 2 midnights of admission.    Author: Collier Bullock, MD 02/03/2023 4:10 PM  For on call review [email or web address].

## 2023-02-03 NOTE — Assessment & Plan Note (Signed)
Blood pressure is stable Continue amlodipine and enalapril

## 2023-02-03 NOTE — Assessment & Plan Note (Signed)
Chronic and stable Monitor closely during this hospitalization

## 2023-02-03 NOTE — Assessment & Plan Note (Signed)
Patient had a CT scan of chest/abdomen/pelvis which showed slowly increasing size of a nodule in the anterior RIGHT middle lobe, within 1 mm of most recent comparison imaging mildly enlarged from 9 x 8 mm in April of 2022. Stable appearance of small upper lobe nodules potentially post infectious or inflammatory in the LEFT upper lobe. Perhaps minimally increased size of the RIGHT upper lobe nodule since 2022 but without change in the short interval compared to the August 2023 evaluation. Follow-up with oncology as an outpatient

## 2023-02-03 NOTE — Assessment & Plan Note (Signed)
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

## 2023-02-03 NOTE — Assessment & Plan Note (Addendum)
Probably symptomatic as patient complains of dizziness Noted to have heart rate in the low 50s Twelve-lead EKG shows a prolonged PR interval Hold metoprolol Obtain TSH levels Place patient on cardiac monitor Consult cardiology

## 2023-02-03 NOTE — Assessment & Plan Note (Signed)
Patient admitted to the hospital for COVID-19 viral infection Was initially thought to be hypoxic with room air pulse oximetry of 90% at rest Check pulse oximetry every shift Start patient on Paxlovid

## 2023-02-03 NOTE — Consult Note (Signed)
Referring Physician: Collier Bullock, MD  Donald Todd is an 89 y.o. male.                       Chief Complaint: Bradycardia and dizziness  HPI: 89 years old male with PMH of metastatic renal cell carcinoma status post radiation treatment, CKD, IIIa, S/P left partial nephrectomy, HTN, macular degeneration and CAD has 2 day h/o cough, weakness, dizziness and sore throat with COVID positive test without pneumonia.  Patient had mild bradycardia that improved to HR in 60's currently post fluid bolus. He has h/o chronic dizziness and uses meclizine for symptomatic relief.  Past Medical History:  Diagnosis Date   Arthritis    CAD (coronary artery disease)    last cath. 07-09-2010   Cancer [facility])    kidney cancer , prostate cancer    Chronic kidney disease    lt kidnet removed   H/O cardiovascular stress test 08-04-2010   ef 55% NO ISCHEMIA   H/O unilateral nephrectomy    lt. nephrectomy   Heart attack ([place]) 1993   History of kidney stones    Hyperlipemia    Hypertension    renal dopplers 218.13-normal patency   Inguinal hernia 08/01/11   right   Macular degeneration    Prostate cancer [facility])    Prostate disease    Cancer S/P seed implant   Renal mass    Vertigo       Past Surgical History:  Procedure Laterality Date   ANGIOPLASTY  1993   athroscopic knee surgery  2002   right   bone scan  2005   CARDIAC CATHETERIZATION  07-09-2010   normal left ventricular function, coronary obsstructive disease with 20% proximal an 30-40% mid lt. anterior descending narrowing ; widely patent stent in the proximal ramus intermediate vessel; 50-70-% narrowing in the mid circumflex,and widely patent stent  in the rt. coronary    CARDIAC CATHETERIZATION  12-22-2003   widely patent stents with noncritical coronary artery disease and normalLV function   CHOLECYSTECTOMY  05/05/2009   Laparoscopic   colonscopy  2004   with biopsy   CORONARY ANGIOPLASTY WITH STENT PLACEMENT  03-15-1996   pci to rci and  ramus branch using j&j [id number] stent,post dialated  at  20atm (rca)  and 14 atm (ramus branch. ef 55%             HERNIA REPAIR  08/01/11   RIH   INGUINAL HERNIA REPAIR Left 03/29/2013   Procedure: HERNIA REPAIR INGUINAL ADULT;  Surgeon: Edward Jolly, MD;  Location: [facility];  Service: General;  Laterality: Left;   INSERTION OF MESH Left 03/29/2013   Procedure: INSERTION OF MESH;  Surgeon: Edward Jolly, MD;  Location: [facility];  Service: General;  Laterality: Left;   JOINT REPLACEMENT  2006 and 2010   left 2006, right 2010   KIDNEY SURGERY  2002 and 2005   2002 - partial removal of left. 2005 complete removal   PROSTATE BIOPSY     prostate seed implant     radiation treatment  2002   prostate   REVERSE SHOULDER ARTHROPLASTY Left 05/26/2017   Procedure: REVERSE LEFT TOTAL SHOULDER ARTHROPLASTY;  Surgeon: Netta Cedars, MD;  Location: [facility];  Service: Orthopedics;  Laterality: Left;   stent implants  1997    Family History  Problem Relation Age of Onset   Cancer Mother        stomach?   Stroke Father  Stroke Brother    Skin cancer Brother    Social History:  reports that he quit smoking about 59 years ago. His smoking use included cigarettes and cigars. He has a 3.25 pack-year smoking history. He has never used smokeless tobacco. He reports that he does not drink alcohol and does not use drugs.  Allergies:  Allergies  Allergen Reactions   Tape Hives    Surgical tape - causes blisters   Hydrocodone     Shaking uncontrollably    Iohexol      Desc: PT DOES RECALL REACTION JUST SAYS IT WAS A LONG TIME AGO FOR KIDNEY X-RAYS AND HE DIDN'T TOLERATE IT WELL-ARS 05/02/09-NOTE E-CHART STATES A HOT FEELING IS HIS REACTION, BUT HE CAN'T CONFIRM THAT WAS ALL    Oxycodone Other (See Comments)    Shaking uncontrollably     Medications Prior to Admission  Medication Sig Dispense Refill   acetaminophen (TYLENOL) 500 MG tablet Take 1,000 mg by mouth every 6 (six) hours as needed for  moderate pain.     amLODipine (NORVASC) 10 MG tablet TAKE 1 TABLET DAILY 90 tablet 3   aspirin 81 MG tablet Take 81 mg by mouth at bedtime.     atorvastatin (LIPITOR) 40 MG tablet TAKE 1 TABLET AT BEDTIME 90 tablet 3   calcium carbonate (TUMS - DOSED IN MG ELEMENTAL CALCIUM) 500 MG chewable tablet Chew 1,000 mg by mouth daily as needed for indigestion or heartburn.     docusate sodium (COLACE) 100 MG capsule Take 1 capsule (100 mg total) by mouth every 12 (twelve) hours. (Patient taking differently: Take 200 mg by mouth daily.) 60 capsule 0   enalapril (VASOTEC) 10 MG tablet TAKE 1 TABLET DAILY 90 tablet 3   ipratropium (ATROVENT) 0.03 % nasal spray Place 1-2 sprays into both nostrils 2 (two) times daily as needed (allergies).     loratadine (CLARITIN) 10 MG tablet Take 10 mg by mouth daily.     meclizine (ANTIVERT) 25 MG tablet Take 25 mg by mouth as needed for dizziness.     metoprolol succinate (TOPROL-XL) 25 MG 24 hr tablet TAKE 1 TABLET DAILY (Patient taking differently: Take 25 mg by mouth daily.) 90 tablet 3   Multiple Vitamins-Minerals (PRESERVISION AREDS 2) CAPS Take 1 capsule by mouth 2 (two) times daily.     nitroGLYCERIN (NITROSTAT) 0.4 MG SL tablet Place 1 tablet (0.4 mg total) under the tongue as needed. 25 tablet 3   polyethylene glycol (MIRALAX / GLYCOLAX) 17 g packet Take 17 g by mouth daily. (Patient not taking: Reported on 02/03/2023) 14 each 2    Results for orders placed or performed during the hospital encounter of 02/03/23 (from the past 48 hour(s))  Resp panel by RT-PCR (RSV, Flu A&B, Covid) Anterior Nasal Swab     Status: Abnormal   Collection Time: 02/03/23  9:33 AM   Specimen: Anterior Nasal Swab  Result Value Ref Range   SARS Coronavirus 2 by RT PCR POSITIVE (A) NEGATIVE    Comment: (NOTE) SARS-CoV-2 target nucleic acids are DETECTED.  The SARS-CoV-2 RNA is generally detectable in upper respiratory specimens during the acute phase of infection. Positive results  are indicative of the presence of the identified virus, but do not rule out bacterial infection or co-infection with other pathogens not detected by the test. Clinical correlation with patient history and other diagnostic information is necessary to determine patient infection status. The expected result is Negative.  Fact Sheet for Patients: EntrepreneurPulse.com.au  Fact Sheet for Healthcare Providers: IncredibleEmployment.be  This test is not yet approved or cleared by the [place] FDA and  has been authorized for detection and/or diagnosis of SARS-CoV-2 by FDA under an Emergency Use Authorization (EUA).  This EUA will remain in effect (meaning this test can be used) for the duration of  the COVID-19 declaration under Section 564(b)(1) of the A ct, 21 U.S.C. section 360bbb-3(b)(1), unless the authorization is terminated or revoked sooner.     Influenza A by PCR NEGATIVE NEGATIVE   Influenza B by PCR NEGATIVE NEGATIVE    Comment: (NOTE) The Xpert Xpress SARS-CoV-2/FLU/RSV plus assay is intended as an aid in the diagnosis of influenza from Nasopharyngeal swab specimens and should not be used as a sole basis for treatment. Nasal washings and aspirates are unacceptable for Xpert Xpress SARS-CoV-2/FLU/RSV testing.  Fact Sheet for Patients: EntrepreneurPulse.com.au  Fact Sheet for Healthcare Providers: IncredibleEmployment.be  This test is not yet approved or cleared by the [place] FDA and has been authorized for detection and/or diagnosis of SARS-CoV-2 by FDA under an Emergency Use Authorization (EUA). This EUA will remain in effect (meaning this test can be used) for the duration of the COVID-19 declaration under Section 564(b)(1) of the Act, 21 U.S.C. section 360bbb-3(b)(1), unless the authorization is terminated or revoked.     Resp Syncytial Virus by PCR NEGATIVE NEGATIVE    Comment:  (NOTE) Fact Sheet for Patients: EntrepreneurPulse.com.au  Fact Sheet for Healthcare Providers: IncredibleEmployment.be  This test is not yet approved or cleared by the [place] FDA and has been authorized for detection and/or diagnosis of SARS-CoV-2 by FDA under an Emergency Use Authorization (EUA). This EUA will remain in effect (meaning this test can be used) for the duration of the COVID-19 declaration under Section 564(b)(1) of the Act, 21 U.S.C. section 360bbb-3(b)(1), unless the authorization is terminated or revoked.  Performed at [facility], [address] [place], [place] [address]   Comprehensive metabolic panel     Status: Abnormal   Collection Time: 02/03/23 10:02 AM  Result Value Ref Range   Sodium 136 135 - 145 mmol/L   Potassium 4.3 3.5 - 5.1 mmol/L   Chloride 105 98 - 111 mmol/L   CO2 25 22 - 32 mmol/L   Glucose, Bld 105 (H) 70 - 99 mg/dL    Comment: Glucose reference range applies only to samples taken after fasting for at least 8 hours.   BUN 23 8 - 23 mg/dL   Creatinine, Ser 1.32 (H) 0.61 - 1.24 mg/dL   Calcium 8.1 (L) 8.9 - 10.3 mg/dL   Total Protein 6.5 6.5 - 8.1 g/dL   Albumin 3.4 (L) 3.5 - 5.0 g/dL   AST 28 15 - 41 U/L   ALT 26 0 - 44 U/L   Alkaline Phosphatase 69 38 - 126 U/L   Total Bilirubin 0.8 0.3 - 1.2 mg/dL   GFR, Estimated 51 (L) >60 mL/min    Comment: (NOTE) Calculated using the CKD-EPI Creatinine Equation (2021)    Anion gap 6 5 - 15    Comment: Performed at [facility], [address] [place], [place] [address]  CBC with Differential     Status: Abnormal   Collection Time: 02/03/23 10:02 AM  Result Value Ref Range   WBC 5.2 4.0 - 10.5 K/uL   RBC 3.69 (L) 4.22 - 5.81 MIL/uL   Hemoglobin 11.2 (L) 13.0 - 17.0 g/dL   HCT 34.5 (L)  39.0 - 52.0 %   MCV 93.5 80.0 - 100.0 fL   MCH 30.4 26.0 - 34.0 pg   MCHC 32.5 30.0 - 36.0 g/dL   RDW 13.2 11.5 - 15.5 %   Platelets 91 (L)  150 - 400 K/uL    Comment: REPEATED TO VERIFY   nRBC 0.0 0.0 - 0.2 %   Neutrophils Relative % 77 %   Neutro Abs 4.0 1.7 - 7.7 K/uL   Lymphocytes Relative 10 %   Lymphs Abs 0.5 (L) 0.7 - 4.0 K/uL   Monocytes Relative 13 %   Monocytes Absolute 0.7 0.1 - 1.0 K/uL   Eosinophils Relative 0 %   Eosinophils Absolute 0.0 0.0 - 0.5 K/uL   Basophils Relative 0 %   Basophils Absolute 0.0 0.0 - 0.1 K/uL   Immature Granulocytes 0 %   Abs Immature Granulocytes 0.02 0.00 - 0.07 K/uL    Comment: Performed at [facility], [address] [place], [place] [address]  Lactic acid, plasma     Status: None   Collection Time: 02/03/23 10:02 AM  Result Value Ref Range   Lactic Acid, Venous 1.4 0.5 - 1.9 mmol/L    Comment: Performed at [facility], [address] [place], [place] [address]   DG Chest 2 View  Result Date: 02/03/2023 CLINICAL DATA:  Short of breath.  Cough. EXAM: CHEST - 2 VIEW COMPARISON:  CT 02/01/2023. FINDINGS: Lateral view degraded by patient arm position. Right shoulder osteoarthritis with high-riding humeral head indicative of chronic rotator cuff insufficiency. Left shoulder arthroplasty. Right hemidiaphragm elevation. Multiple leads and wires project over the chest on the frontal view. Midline trachea. Normal heart size. Atherosclerosis in the transverse aorta. No pleural effusion or pneumothorax. No congestive failure. Clear lungs. Remote left rib fractures. IMPRESSION: No acute cardiopulmonary disease. Electronically Signed   By: Abigail Miyamoto M.D.   On: 02/03/2023 10:02   CT CHEST ABDOMEN PELVIS WO CONTRAST  Result Date: 02/02/2023 CLINICAL DATA:  A 89 year old male presents for evaluation of renal cell carcinoma recurrence. * Tracking Code: BO * EXAM: CT CHEST, ABDOMEN AND PELVIS WITHOUT CONTRAST TECHNIQUE: Multidetector CT imaging of the chest, abdomen and pelvis was performed following the standard protocol without IV contrast. RADIATION DOSE REDUCTION: This  exam was performed according to the departmental dose-optimization program which includes automated exposure control, adjustment of the mA and/or kV according to patient size and/or use of iterative reconstruction technique. COMPARISON:  August 01, 2022 FINDINGS: CT CHEST FINDINGS Cardiovascular: Stable appearance of the heart great vessels with calcified coronary artery disease, mild to moderate cardiomegaly and calcified aortic atherosclerosis. Heart with mass effect from moderate to marked pectus deformity similar to prior imaging as well. Mediastinum/Nodes: No thoracic inlet, axillary, mediastinal or hilar adenopathy. Esophagus grossly normal. Lungs/Pleura: Branching nodularity in the anterior LEFT upper lobe is stable potentially mucoid impaction, unchanged again since August of 2023 in total on single axial image measuring 10 x 6 mm, essentially stable dating back to 2022 as well. Slowly increasing size of a nodule in the anterior RIGHT middle lobe 11 x 9 mm, within 1 mm of most recent comparison imaging enlarged from 9 x 8 mm in April of 2022. Parenchymal distortion in the RIGHT chest with similar appearance. Biapical pleural and parenchymal scarring. Airways are patent. No pleural effusion. Stable Peri fissural nodule on image 64/4 in the RIGHT upper lobe 6 mm. Musculoskeletal: See below for full musculoskeletal details. No chest wall mass.  Signs of pectus excavatum. CT ABDOMEN PELVIS FINDINGS Hepatobiliary: Lobular hepatic contours similar to prior imaging with changes of cholecystectomy. Pancreas: Pancreas with normal contours, no signs of inflammation or peripancreatic fluid. Spleen: Normal size and contour. Adrenals/Urinary Tract: Post LEFT nephrectomy and adrenalectomy. Stable low-density RIGHT adrenal lesion measuring 17 mm, unchanged since 2022. Compatible with RIGHT adrenal adenoma. Low-density RIGHT renal lesion measuring water density with homogeneous appearance compatible with a cyst 3.4 cm also  without change. No ureteral dilation or perivesical stranding. Stomach/Bowel: Unchanged herniation of the colon into the abdominal wall laxity/flank hernia on the LEFT flank with broad-based defect in abdominal wall musculature, overlying external oblique may be intact and again findings are not changed. There is moderate to marked gastric distension which is little changed and without adjacent stranding. There is no sign of small bowel obstruction or inflammation. Appendix not visible, no secondary signs to suggest acute appendiceal process. Colonic diverticulosis. Vascular/Lymphatic: Marked calcified aortic atherosclerosis without aneurysm. Ill-defined loss of normal muscular architecture about the LEFT L1 transverse process associated with the psoas muscle is stable. No discrete adenopathy in the retroperitoneum. No adenopathy of the pelvis. Reproductive: Brachytherapy seeds in the prostate. Other: No ascites. Musculoskeletal: No change in the appearance of site of known muscular metastatic lesion. Spinal degenerative changes. Post LEFT glenohumeral joint arthroplasty with reverse shoulder arthroplasty. No destructive bone process. IMPRESSION: 1. Slowly increasing size of a nodule in the anterior RIGHT middle lobe, within 1 mm of most recent comparison imaging mildly enlarged from 9 x 8 mm in April of 2022. Stable appearance of small upper lobe nodules potentially post infectious or inflammatory in the LEFT upper lobe. Perhaps minimally increased size of the RIGHT upper lobe nodule since 2022 but without change in the short interval compared to the August 2023 evaluation. 2. Unchanged appearance of site of known intramuscular metastatic process following LEFT nephrectomy. 3. No new findings. 4. Aortic atherosclerosis and coronary artery disease. 5. Colonic diverticulosis Aortic Atherosclerosis (ICD10-I70.0). Electronically Signed   By: Zetta Bills M.D.   On: 02/02/2023 11:35    Review Of Systems As per  HPI.   Blood pressure 135/63, pulse 63, temperature 99.4 F (37.4 C), temperature source Oral, resp. rate 18, height '5\' 7"'$  (1.702 m), weight 62.6 kg, SpO2 98 %. Body mass index is 21.61 kg/m. General appearance: alert, cooperative, appears stated age and mild distress Head: Normocephalic, atraumatic. Eyes: Blue eyes, pink conjunctiva, corneas clear. PERRL, EOM's intact. Neck: No adenopathy, no carotid bruit, no JVD, supple, symmetrical, trachea midline and thyroid not enlarged. Resp: Clearing to auscultation bilaterally. Cardio: Regular rate and rhythm, S1, S2 normal, II/VI systolic murmur, no click, rub or gallop GI: Soft, non-tender; bowel sounds normal; no organomegaly. Extremities: No edema, cyanosis or clubbing. Skin: Warm and dry.  Neurologic: Alert and oriented X 2, normal strength.   Assessment/Plan Dizziness Episode of sinus bradycardia COVID infection HTN CAD S/P Renal cell CA, metastatic S/P radiation treatment CKD, IIIa Mild dehydration Benign positinal vertigo  Plan: Agree with hydration. If bradycardia persist will discontinue B-blocker. Decrease Amlodipine by 50 % to decrease dizziness.  Time spent: Review of old records, Lab, x-rays, EKG, other cardiac tests, examination, discussion with patient/Doctor over 70 minutes.  Birdie Riddle, MD  02/03/2023, 4:54 PM

## 2023-02-04 DIAGNOSIS — Z905 Acquired absence of kidney: Secondary | ICD-10-CM | POA: Diagnosis not present

## 2023-02-04 DIAGNOSIS — C7801 Secondary malignant neoplasm of right lung: Secondary | ICD-10-CM | POA: Diagnosis present

## 2023-02-04 DIAGNOSIS — M1990 Unspecified osteoarthritis, unspecified site: Secondary | ICD-10-CM | POA: Diagnosis present

## 2023-02-04 DIAGNOSIS — I440 Atrioventricular block, first degree: Secondary | ICD-10-CM | POA: Diagnosis present

## 2023-02-04 DIAGNOSIS — E860 Dehydration: Secondary | ICD-10-CM | POA: Diagnosis present

## 2023-02-04 DIAGNOSIS — J9811 Atelectasis: Secondary | ICD-10-CM | POA: Diagnosis present

## 2023-02-04 DIAGNOSIS — R42 Dizziness and giddiness: Secondary | ICD-10-CM | POA: Diagnosis present

## 2023-02-04 DIAGNOSIS — Z96612 Presence of left artificial shoulder joint: Secondary | ICD-10-CM | POA: Diagnosis present

## 2023-02-04 DIAGNOSIS — E785 Hyperlipidemia, unspecified: Secondary | ICD-10-CM | POA: Diagnosis present

## 2023-02-04 DIAGNOSIS — D649 Anemia, unspecified: Secondary | ICD-10-CM | POA: Diagnosis not present

## 2023-02-04 DIAGNOSIS — R2681 Unsteadiness on feet: Secondary | ICD-10-CM | POA: Diagnosis present

## 2023-02-04 DIAGNOSIS — Z955 Presence of coronary angioplasty implant and graft: Secondary | ICD-10-CM | POA: Diagnosis not present

## 2023-02-04 DIAGNOSIS — D696 Thrombocytopenia, unspecified: Secondary | ICD-10-CM | POA: Diagnosis present

## 2023-02-04 DIAGNOSIS — H3530 Unspecified macular degeneration: Secondary | ICD-10-CM | POA: Diagnosis present

## 2023-02-04 DIAGNOSIS — Z681 Body mass index (BMI) 19 or less, adult: Secondary | ICD-10-CM | POA: Diagnosis not present

## 2023-02-04 DIAGNOSIS — F05 Delirium due to known physiological condition: Secondary | ICD-10-CM | POA: Diagnosis not present

## 2023-02-04 DIAGNOSIS — Z85528 Personal history of other malignant neoplasm of kidney: Secondary | ICD-10-CM | POA: Diagnosis not present

## 2023-02-04 DIAGNOSIS — I252 Old myocardial infarction: Secondary | ICD-10-CM | POA: Diagnosis not present

## 2023-02-04 DIAGNOSIS — Z66 Do not resuscitate: Secondary | ICD-10-CM | POA: Diagnosis present

## 2023-02-04 DIAGNOSIS — N189 Chronic kidney disease, unspecified: Secondary | ICD-10-CM | POA: Diagnosis not present

## 2023-02-04 DIAGNOSIS — R339 Retention of urine, unspecified: Secondary | ICD-10-CM | POA: Diagnosis not present

## 2023-02-04 DIAGNOSIS — N390 Urinary tract infection, site not specified: Secondary | ICD-10-CM | POA: Diagnosis not present

## 2023-02-04 DIAGNOSIS — I444 Left anterior fascicular block: Secondary | ICD-10-CM | POA: Diagnosis present

## 2023-02-04 DIAGNOSIS — U071 COVID-19: Principal | ICD-10-CM | POA: Diagnosis present

## 2023-02-04 DIAGNOSIS — R001 Bradycardia, unspecified: Secondary | ICD-10-CM | POA: Diagnosis not present

## 2023-02-04 DIAGNOSIS — E871 Hypo-osmolality and hyponatremia: Secondary | ICD-10-CM | POA: Diagnosis present

## 2023-02-04 DIAGNOSIS — I129 Hypertensive chronic kidney disease with stage 1 through stage 4 chronic kidney disease, or unspecified chronic kidney disease: Secondary | ICD-10-CM | POA: Diagnosis present

## 2023-02-04 DIAGNOSIS — R54 Age-related physical debility: Secondary | ICD-10-CM | POA: Diagnosis present

## 2023-02-04 DIAGNOSIS — I2510 Atherosclerotic heart disease of native coronary artery without angina pectoris: Secondary | ICD-10-CM | POA: Diagnosis present

## 2023-02-04 DIAGNOSIS — N1831 Chronic kidney disease, stage 3a: Secondary | ICD-10-CM | POA: Diagnosis present

## 2023-02-04 DIAGNOSIS — E440 Moderate protein-calorie malnutrition: Secondary | ICD-10-CM | POA: Diagnosis present

## 2023-02-04 DIAGNOSIS — Z515 Encounter for palliative care: Secondary | ICD-10-CM | POA: Diagnosis not present

## 2023-02-04 DIAGNOSIS — H548 Legal blindness, as defined in USA: Secondary | ICD-10-CM | POA: Diagnosis present

## 2023-02-04 DIAGNOSIS — I10 Essential (primary) hypertension: Secondary | ICD-10-CM | POA: Diagnosis not present

## 2023-02-04 DIAGNOSIS — C7989 Secondary malignant neoplasm of other specified sites: Secondary | ICD-10-CM | POA: Diagnosis present

## 2023-02-04 DIAGNOSIS — R5381 Other malaise: Secondary | ICD-10-CM | POA: Diagnosis present

## 2023-02-04 DIAGNOSIS — Z8701 Personal history of pneumonia (recurrent): Secondary | ICD-10-CM | POA: Diagnosis not present

## 2023-02-04 DIAGNOSIS — R338 Other retention of urine: Secondary | ICD-10-CM | POA: Diagnosis not present

## 2023-02-04 DIAGNOSIS — Z87891 Personal history of nicotine dependence: Secondary | ICD-10-CM | POA: Diagnosis not present

## 2023-02-04 DIAGNOSIS — N179 Acute kidney failure, unspecified: Secondary | ICD-10-CM | POA: Diagnosis not present

## 2023-02-04 DIAGNOSIS — K5901 Slow transit constipation: Secondary | ICD-10-CM | POA: Diagnosis not present

## 2023-02-04 DIAGNOSIS — R059 Cough, unspecified: Secondary | ICD-10-CM | POA: Diagnosis present

## 2023-02-04 DIAGNOSIS — R630 Anorexia: Secondary | ICD-10-CM | POA: Diagnosis present

## 2023-02-04 DIAGNOSIS — Z8616 Personal history of COVID-19: Secondary | ICD-10-CM | POA: Diagnosis not present

## 2023-02-04 LAB — CBC
HCT: 34.9 % — ABNORMAL LOW (ref 39.0–52.0)
Hemoglobin: 10.9 g/dL — ABNORMAL LOW (ref 13.0–17.0)
MCH: 30.1 pg (ref 26.0–34.0)
MCHC: 31.2 g/dL (ref 30.0–36.0)
MCV: 96.4 fL (ref 80.0–100.0)
Platelets: 84 10*3/uL — ABNORMAL LOW (ref 150–400)
RBC: 3.62 MIL/uL — ABNORMAL LOW (ref 4.22–5.81)
RDW: 13.2 % (ref 11.5–15.5)
WBC: 4 10*3/uL (ref 4.0–10.5)
nRBC: 0 % (ref 0.0–0.2)

## 2023-02-04 LAB — PROCALCITONIN: Procalcitonin: 0.1 ng/mL

## 2023-02-04 LAB — LACTIC ACID, PLASMA
Lactic Acid, Venous: 1.5 mmol/L (ref 0.5–1.9)
Lactic Acid, Venous: 1.6 mmol/L (ref 0.5–1.9)

## 2023-02-04 LAB — COMPREHENSIVE METABOLIC PANEL
ALT: 23 U/L (ref 0–44)
AST: 30 U/L (ref 15–41)
Albumin: 3 g/dL — ABNORMAL LOW (ref 3.5–5.0)
Alkaline Phosphatase: 52 U/L (ref 38–126)
Anion gap: 9 (ref 5–15)
BUN: 23 mg/dL (ref 8–23)
CO2: 23 mmol/L (ref 22–32)
Calcium: 8.1 mg/dL — ABNORMAL LOW (ref 8.9–10.3)
Chloride: 108 mmol/L (ref 98–111)
Creatinine, Ser: 0.96 mg/dL (ref 0.61–1.24)
GFR, Estimated: >60 mL/min (ref >60)
Glucose, Bld: 86 mg/dL (ref 70–99)
Potassium: 4.3 mmol/L (ref 3.5–5.1)
Sodium: 140 mmol/L (ref 135–145)
Total Bilirubin: 0.6 mg/dL (ref 0.3–1.2)
Total Protein: 5.6 g/dL — ABNORMAL LOW (ref 6.5–8.1)

## 2023-02-04 LAB — D-DIMER, QUANTITATIVE: D-Dimer, Quant: 0.87 ug/mL-FEU — ABNORMAL HIGH (ref 0.00–0.50)

## 2023-02-04 LAB — C-REACTIVE PROTEIN: CRP: 6.2 mg/dL — ABNORMAL HIGH (ref <1.0)

## 2023-02-04 MED ORDER — AMLODIPINE BESYLATE 10 MG PO TABS
10.0000 mg | ORAL_TABLET | Freq: Every day | ORAL | Status: DC
  Administered 2023-02-05 – 2023-02-10 (×6): 10 mg via ORAL
  Filled 2023-02-04 (×6): qty 1

## 2023-02-04 MED ORDER — SODIUM CHLORIDE 0.9 % IV SOLN: INTRAVENOUS | Status: AC

## 2023-02-04 NOTE — Consult Note (Signed)
VS stable. HR in 50's and 60's. Currently off metoprolol. Continue medical treatment.  Dixie Dials, MD 02/04/2023, 9:45 AM.

## 2023-02-04 NOTE — Progress Notes (Addendum)
PROGRESS NOTE   PLUMER NEIN  [id number] DOB: [birth year] DOA: 02/03/2023 PCP: [name], MD  Brief Narrative:  89 year old white male known history of prostate cancer status post seeding, metastatic renal CA diagnosed in June 2003 T1 lesion at the time with negative margins with recurrence back in 2005 previously managed by Dr. Gaynelle Arabian who is since retired He still has mets -->psoas muscle status post XRT, status post left partial nephrectomy--prostate cancer status post seeding Hypertension as well as RAS-right-sided prior gallstone pancreatitis 2010 Macular degeneration Stage IIIa CKD CAD status post RCA stenting 1997, MI in 1993  WBC 5.2 hemoglobin 11.2 platelet 91,000 BUN/creatinine 23/1.3 AST/ALT 28/26, alk phos 69 total bili 0.8 EKG NSR prolonged PR PAC  Admitted for COVID-19 pneumonia Started on Paxlovid on admission but developed diarrhea from this apparently   Hospital-Problem based course  COVID-19 mild infection-Main symptom cough -CRP 6.2, dimer 0.8, lactic acid 1.5, procalcitonin <0.1 -Continue Paxlovid 2 tabs twice daily complete course on 02/07/2023 -No need for steroids at this juncture-watch CRP however in a.m. -Continue NS 50 cc/H and then saline lock after 12 more hours -Continue Robitussin 5 mill every 4 as needed -Incentive spirometer every hour on the hour 5 times  Diarrhea -Probably secondary to viral illness COVID versus Paxlovid treatment for the illness - Do not order C. difficile unless has 3 stools in 24 hours  Bradycardia 50-60 on admission - No alarm signs or symptoms keep on telemetry 24 hours - No need for further workup as beta-blocker has been appropriately discontinued and this is resolved  Thrombocytopenia -Secondary to viral illness - No further workup unless drops below 50 and then reevaluation discussed with family/patient  Chronic dizziness--likely a component of macular degeneration plays a role in this also - Can  continue meclizine 25 orally as needed for dizziness although there is therapeutic ceiling to this medication and it can paradoxically worsen vertigo - Suggest discussion outpatient  Metastatic prostate Cancer 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  In the past CKD 3 A - Increased 5 mg to-->amlodipine 10, discontinued enalapril  Prior CAD status post PTCA -Aspirin 81 daily atorvastatin 40 daily nitroglycerin SL  DVT prophylaxis:  Code Status: DNR confirmed at the bedside Family Communication:  Disposition:  Status is: Observation The patient will require care spanning > 2 midnights and should be moved to inpatient because:   Needs improvement over the next 1 to 2 days to ensure able to hydrate and manage electrolytes at home Ensure no worsening of COVID parameters in frail elderly patient   Subjective:  Looks well feels well no distress coughing some no sputum Tolerating liquids mainly has not really eaten and does not seem to want to --1 episode of [place] stage IV diarrhea only note low-grade fever overnight  Objective: Vitals:   02/03/23 1438 02/03/23 1838 02/03/23 2241 02/04/23 0453  BP: 135/63 (!) 114/55 131/65 116/62  Pulse: 63 (!) 59 69 (!) 51  Resp: '18 20 20 16  '$ Temp: 99.4 F (37.4 C) 98.7 F (37.1 C) (!) 100.6 F (38.1 C) 98.4 F (36.9 C)  TempSrc: Oral Oral Oral Oral  SpO2: 98% 94% 98% 96%  Weight:      Height:        Intake/Output Summary (Last 24 hours) at 02/04/2023 0849 Last data filed at 02/04/2023 0803 Gross per 24 hour  Intake 820.53 ml  Output 475 ml  Net 345.53 ml   Filed Weights   02/03/23 0930 02/03/23 1027  Weight: 63 kg 62.6 kg    Examination:  EOMI NCAT no focal deficit no icterus no rales no rhonchi no wheeze CTAB no added sound S1-S2 no murmur no rub no gallop Abdomen obese nontender no rebound no  guarding No rash no lower extremity edema ROM intact no focal deficit Visual acuity is somewhat diminished   Data Reviewed: personally reviewed   CBC    Component Value Date/Time   WBC 4.0 02/04/2023 0559   RBC 3.62 (L) 02/04/2023 0559   HGB 10.9 (L) 02/04/2023 0559   HGB 11.9 (L) 02/02/2023 1026   HCT 34.9 (L) 02/04/2023 0559   PLT 84 (L) 02/04/2023 0559   PLT 125 (L) 02/02/2023 1026   MCV 96.4 02/04/2023 0559   MCH 30.1 02/04/2023 0559   MCHC 31.2 02/04/2023 0559   RDW 13.2 02/04/2023 0559   LYMPHSABS 0.5 (L) 02/03/2023 1002   MONOABS 0.7 02/03/2023 1002   EOSABS 0.0 02/03/2023 1002   BASOSABS 0.0 02/03/2023 1002      Latest Ref Rng & Units 02/04/2023    5:59 AM 02/03/2023   10:02 AM 02/02/2023   10:26 AM  CMP  Glucose 70 - 99 mg/dL 86  105  101   BUN 8 - 23 mg/dL '23  23  22   '$ Creatinine 0.61 - 1.24 mg/dL 0.96  1.32  1.45   Sodium 135 - 145 mmol/L 140  136  137   Potassium 3.5 - 5.1 mmol/L 4.3  4.3  4.5   Chloride 98 - 111 mmol/L 108  105  108   CO2 22 - 32 mmol/L '23  25  25   '$ Calcium 8.9 - 10.3 mg/dL 8.1  8.1  8.3   Total Protein 6.5 - 8.1 g/dL 5.6  6.5  6.4   Total Bilirubin 0.3 - 1.2 mg/dL 0.6  0.8  0.4   Alkaline Phos 38 - 126 U/L 52  69  84   AST 15 - 41 U/L '30  28  27   '$ ALT 0 - 44 U/L '23  26  23      '$ Radiology Studies: DG Chest 2 View  Result Date: 02/03/2023 CLINICAL DATA:  Short of breath.  Cough. EXAM: CHEST - 2 VIEW COMPARISON:  CT 02/01/2023. FINDINGS: Lateral view degraded by patient arm position. Right shoulder osteoarthritis with high-riding humeral head indicative of chronic rotator cuff insufficiency. Left shoulder arthroplasty. Right hemidiaphragm elevation. Multiple leads and wires project over the chest on the frontal view. Midline trachea. Normal heart size. Atherosclerosis in the transverse aorta. No pleural effusion or pneumothorax. No congestive failure. Clear lungs. Remote left rib fractures. IMPRESSION: No acute cardiopulmonary disease.  Electronically Signed   By: Abigail Miyamoto M.D.   On: 02/03/2023 10:02     Scheduled Meds:  amLODipine  5 mg Oral Daily   aspirin EC  81 mg Oral QHS   [START ON 02/09/2023] atorvastatin  40 mg Oral QHS   benzonatate  200 mg Oral Once   enalapril  10 mg Oral Daily   loratadine  10 mg Oral Daily   menthol-cetylpyridinium  1 lozenge Oral TID   multivitamin  1 tablet Oral BID   nirmatrelvir/ritonavir (renal dosing)  2 tablet Oral BID   Continuous Infusions:   LOS: 0 days   Time spent: 67  Nita Sells, MD [facility] To contact the attending  provider between 7A-7P or the covering provider during after hours 7P-7A, please log into the web site [email or web address] and access using universal [place] password for that web site. If you do not have the password, please call the hospital operator.  02/04/2023, 8:49 AM

## 2023-02-05 DIAGNOSIS — U071 COVID-19: Secondary | ICD-10-CM | POA: Diagnosis not present

## 2023-02-05 LAB — COMPREHENSIVE METABOLIC PANEL
ALT: 26 U/L (ref 0–44)
AST: 37 U/L (ref 15–41)
Albumin: 3.2 g/dL — ABNORMAL LOW (ref 3.5–5.0)
Alkaline Phosphatase: 65 U/L (ref 38–126)
Anion gap: 6 (ref 5–15)
BUN: 15 mg/dL (ref 8–23)
CO2: 24 mmol/L (ref 22–32)
Calcium: 8.2 mg/dL — ABNORMAL LOW (ref 8.9–10.3)
Chloride: 107 mmol/L (ref 98–111)
Creatinine, Ser: 1.02 mg/dL (ref 0.61–1.24)
GFR, Estimated: >60 mL/min (ref >60)
Glucose, Bld: 101 mg/dL — ABNORMAL HIGH (ref 70–99)
Potassium: 4.2 mmol/L (ref 3.5–5.1)
Sodium: 137 mmol/L (ref 135–145)
Total Bilirubin: 1.1 mg/dL (ref 0.3–1.2)
Total Protein: 6.2 g/dL — ABNORMAL LOW (ref 6.5–8.1)

## 2023-02-05 LAB — CBC WITH DIFFERENTIAL/PLATELET
Abs Immature Granulocytes: 0 10*3/uL (ref 0.00–0.07)
Basophils Absolute: 0 10*3/uL (ref 0.0–0.1)
Basophils Relative: 0 %
Eosinophils Absolute: 0 10*3/uL (ref 0.0–0.5)
Eosinophils Relative: 1 %
HCT: 37.9 % — ABNORMAL LOW (ref 39.0–52.0)
Hemoglobin: 12.5 g/dL — ABNORMAL LOW (ref 13.0–17.0)
Immature Granulocytes: 0 %
Lymphocytes Relative: 26 %
Lymphs Abs: 0.8 10*3/uL (ref 0.7–4.0)
MCH: 30.6 pg (ref 26.0–34.0)
MCHC: 33 g/dL (ref 30.0–36.0)
MCV: 92.9 fL (ref 80.0–100.0)
Monocytes Absolute: 0.3 10*3/uL (ref 0.1–1.0)
Monocytes Relative: 10 %
Neutro Abs: 1.9 10*3/uL (ref 1.7–7.7)
Neutrophils Relative %: 63 %
Platelets: 100 10*3/uL — ABNORMAL LOW (ref 150–400)
RBC: 4.08 MIL/uL — ABNORMAL LOW (ref 4.22–5.81)
RDW: 12.7 % (ref 11.5–15.5)
WBC: 3 10*3/uL — ABNORMAL LOW (ref 4.0–10.5)
nRBC: 0 % (ref 0.0–0.2)

## 2023-02-05 LAB — D-DIMER, QUANTITATIVE: D-Dimer, Quant: 0.92 ug/mL-FEU — ABNORMAL HIGH (ref 0.00–0.50)

## 2023-02-05 LAB — C-REACTIVE PROTEIN: CRP: 6.4 mg/dL — ABNORMAL HIGH (ref <1.0)

## 2023-02-05 MED ORDER — DEXAMETHASONE 4 MG PO TABS
4.0000 mg | ORAL_TABLET | Freq: Every day | ORAL | Status: DC
  Administered 2023-02-05 – 2023-02-08 (×4): 4 mg via ORAL
  Filled 2023-02-05 (×4): qty 1

## 2023-02-05 MED ORDER — DEXAMETHASONE 4 MG PO TABS: 6.0000 mg | ORAL_TABLET | Freq: Every day | ORAL | Status: DC

## 2023-02-05 NOTE — Progress Notes (Signed)
PROGRESS NOTE   Donald Todd  [id number] DOB: 04/16/1929 DOA: 02/03/2023 PCP: [name], MD  Brief Narrative:  89 year old white male known history of prostate cancer status post seeding, metastatic renal CA diagnosed in June 2003 T1 lesion at the time with negative margins with recurrence back in 2005 previously managed by Dr. Gaynelle Arabian who is since retired He still has mets -->psoas muscle status post XRT, status post left partial nephrectomy--prostate cancer status post seeding Hypertension as well as RAS-right-sided prior gallstone pancreatitis 2010 Macular degeneration Stage IIIa CKD CAD status post RCA stenting 1997, MI in 1993  WBC 5.2 hemoglobin 11.2 platelet 91,000 BUN/creatinine 23/1.3 AST/ALT 28/26, alk phos 69 total bili 0.8 EKG NSR prolonged PR PAC  Admitted for COVID-19 pneumonia Started on Paxlovid on admission but developed diarrhea from this apparently   Hospital-Problem based course  COVID-19 mild infection-Main symptom cough - Mild cough today dimer only slightly elevated CRP still pending -Continue Paxlovid 2 tabs twice daily complete course on 02/07/2023 - May decide to start steroids depending on how he looks -Continue Robitussin 5 mill every 4 as needed -Incentive spirometer every hour on the hour 5 times, he is compliant  Diarrhea -Probably secondary to viral illness COVID versus Paxlovid treatment for the illness - Do not order C. 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  In the past CKD 3 A - Increased 5 mg to-->amlodipine 10, discontinued enalapril  Prior CAD status post PTCA -Aspirin 81 daily atorvastatin 40 daily nitroglycerin SL  DVT prophylaxis:  Code Status: DNR confirmed at the bedside Family Communication:  Disposition:  Status is: Observation The patient will require care spanning > 2 midnights and should be moved to inpatient because:   No family present today-patient requires another 24 to 48 hours to ensure stability prior to discharge home  Subjective:  Coherent awake no distress Has a worsening cough however taking suppressants and mucolytic's as well as using incentive and bullet   Objective: Vitals:   02/03/23 2241 02/04/23 0453 02/04/23 1413 02/04/23 2029  BP: 131/65 116/62 (!) 128/56 133/61  Pulse: 69 (!) 51 64 64  Resp: '20 16 18 18  '$ Temp: (!) 100.6 F (38.1 C) 98.4 F (36.9 C) 97.6 F (36.4 C) 98.8 F (37.1 C)  TempSrc: Oral Oral Oral Oral  SpO2: 98% 96% 97% 97%  Weight:      Height:        Intake/Output Summary (Last 24 hours) at 02/05/2023 1041 Last data filed at 02/05/2023 0241 Gross per 24 hour  Intake --  Output 203 ml  Net -203 ml    Filed Weights   02/03/23 0930 02/03/23 1027  Weight: 63 kg 62.6 kg    Examination:  Thin white male EOMI NCAT no focal deficit  S1-S2 no murmur  Mild wheeze no rales otherwise similar  to exam previously Abdomen obese nontender no rebound no guarding No rash no lower extremity edema ROM intact no focal deficit Visual acuity diminished   Data Reviewed: personally reviewed   CBC    Component Value Date/Time   WBC 3.0 (L) 02/05/2023 0901   RBC 4.08 (L) 02/05/2023 0901   HGB 12.5 (L) 02/05/2023 0901   HGB 11.9 (L) 02/02/2023 1026    HCT 37.9 (L) 02/05/2023 0901   PLT 100 (L) 02/05/2023 0901   PLT 125 (L) 02/02/2023 1026   MCV 92.9 02/05/2023 0901   MCH 30.6 02/05/2023 0901   MCHC 33.0 02/05/2023 0901   RDW 12.7 02/05/2023 0901   LYMPHSABS 0.8 02/05/2023 0901   MONOABS 0.3 02/05/2023 0901   EOSABS 0.0 02/05/2023 0901   BASOSABS 0.0 02/05/2023 0901      Latest Ref Rng & Units 02/05/2023    9:01 AM 02/04/2023    5:59 AM 02/03/2023   10:02 AM  CMP  Glucose 70 - 99 mg/dL 101  86  105   BUN 8 - 23 mg/dL '15  23  23   '$ Creatinine 0.61 - 1.24 mg/dL 1.02  0.96  1.32   Sodium 135 - 145 mmol/L 137  140  136   Potassium 3.5 - 5.1 mmol/L 4.2  4.3  4.3   Chloride 98 - 111 mmol/L 107  108  105   CO2 22 - 32 mmol/L '24  23  25   '$ Calcium 8.9 - 10.3 mg/dL 8.2  8.1  8.1   Total Protein 6.5 - 8.1 g/dL 6.2  5.6  6.5   Total Bilirubin 0.3 - 1.2 mg/dL 1.1  0.6  0.8   Alkaline Phos 38 - 126 U/L 65  52  69   AST 15 - 41 U/L 37  30  28   ALT 0 - 44 U/L '26  23  26      '$ Radiology Studies: No results found.   Scheduled Meds:  amLODipine  10 mg Oral Daily   aspirin EC  81 mg Oral QHS   [START ON 02/09/2023] atorvastatin  40 mg Oral QHS   benzonatate  200 mg Oral Once   loratadine  10 mg Oral Daily   menthol-cetylpyridinium  1 lozenge Oral TID   multivitamin  1 tablet Oral BID   nirmatrelvir/ritonavir (renal dosing)  2 tablet Oral BID   Continuous Infusions:   LOS: 1 day   Time spent: 24  Nita Sells, MD [facility] To contact the attending provider between 7A-7P or the covering provider during after hours 7P-7A, please log into the web site [email or web address] and access using universal [place] password for that web site. If you do not have the password, please call the hospital operator.  02/05/2023, 10:41 AM

## 2023-02-06 DIAGNOSIS — U071 COVID-19: Secondary | ICD-10-CM | POA: Diagnosis not present

## 2023-02-06 LAB — BASIC METABOLIC PANEL
Anion gap: 9 (ref 5–15)
BUN: 20 mg/dL (ref 8–23)
CO2: 25 mmol/L (ref 22–32)
Calcium: 8.4 mg/dL — ABNORMAL LOW (ref 8.9–10.3)
Chloride: 101 mmol/L (ref 98–111)
Creatinine, Ser: 1.15 mg/dL (ref 0.61–1.24)
GFR, Estimated: >60 mL/min (ref >60)
Glucose, Bld: 255 mg/dL — ABNORMAL HIGH (ref 70–99)
Potassium: 3.9 mmol/L (ref 3.5–5.1)
Sodium: 135 mmol/L (ref 135–145)

## 2023-02-06 LAB — CBC WITH DIFFERENTIAL/PLATELET
Abs Immature Granulocytes: 0.01 10*3/uL (ref 0.00–0.07)
Basophils Absolute: 0 10*3/uL (ref 0.0–0.1)
Basophils Relative: 0 %
Eosinophils Absolute: 0 10*3/uL (ref 0.0–0.5)
Eosinophils Relative: 0 %
HCT: 39.6 % (ref 39.0–52.0)
Hemoglobin: 13.2 g/dL (ref 13.0–17.0)
Immature Granulocytes: 1 %
Lymphocytes Relative: 18 %
Lymphs Abs: 0.3 10*3/uL — ABNORMAL LOW (ref 0.7–4.0)
MCH: 30.7 pg (ref 26.0–34.0)
MCHC: 33.3 g/dL (ref 30.0–36.0)
MCV: 92.1 fL (ref 80.0–100.0)
Monocytes Absolute: 0.1 10*3/uL (ref 0.1–1.0)
Monocytes Relative: 5 %
Neutro Abs: 1.5 10*3/uL — ABNORMAL LOW (ref 1.7–7.7)
Neutrophils Relative %: 76 %
Platelets: 100 10*3/uL — ABNORMAL LOW (ref 150–400)
RBC: 4.3 MIL/uL (ref 4.22–5.81)
RDW: 12.6 % (ref 11.5–15.5)
WBC: 1.9 10*3/uL — ABNORMAL LOW (ref 4.0–10.5)
nRBC: 0 % (ref 0.0–0.2)

## 2023-02-06 LAB — PROCALCITONIN: Procalcitonin: <0.1 ng/mL

## 2023-02-06 LAB — C-REACTIVE PROTEIN: CRP: 3.5 mg/dL — ABNORMAL HIGH (ref <1.0)

## 2023-02-06 LAB — D-DIMER, QUANTITATIVE: D-Dimer, Quant: 0.69 ug/mL-FEU — ABNORMAL HIGH (ref 0.00–0.50)

## 2023-02-06 MED ORDER — MELATONIN 3 MG PO TABS
3.0000 mg | ORAL_TABLET | Freq: Once | ORAL | Status: AC
  Administered 2023-02-06: 3 mg via ORAL
  Filled 2023-02-06: qty 1

## 2023-02-06 NOTE — Progress Notes (Signed)
  Transition of Care [facility]) Screening Note   Patient Details  Name: Donald Todd Date of Birth: 1929-04-10   Transition of Care [facility]) CM/SW Contact:    Vassie Moselle, LCSW Phone Number: 02/06/2023, 12:42 PM    Transition of Care Department [facility]) has reviewed patient and no TOC needs have been identified at this time. We will continue to monitor patient advancement through interdisciplinary progression rounds. If new patient transition needs arise, please place a TOC consult.

## 2023-02-06 NOTE — Progress Notes (Signed)
PROGRESS NOTE   Donald Todd  [id number] DOB: 21-Dec-1928 DOA: 02/03/2023 PCP: [name], MD  Brief Narrative:  89 year old white male known history of prostate cancer status post seeding, metastatic renal CA diagnosed in June 2003 T1 lesion at the time with negative margins with recurrence back in 2005 previously managed by Dr. Gaynelle Arabian who is since retired He still has mets -->psoas muscle status post XRT, status post left partial nephrectomy--prostate cancer status post seeding Hypertension as well as RAS-right-sided prior gallstone pancreatitis 2010 Macular degeneration Stage IIIa CKD CAD status post RCA stenting 1997, MI in 1993  WBC 5.2 hemoglobin 11.2 platelet 91,000 BUN/creatinine 23/1.3 AST/ALT 28/26, alk phos 69 total bili 0.8 EKG NSR prolonged PR PAC  Admitted for COVID-19 pneumonia Started on Paxlovid on admission but developed diarrhea from this apparently   Hospital-Problem based course  COVID-19 mild infection-Main symptom cough - crp down- Paxlovid 2 tabs twice daily complete course on 02/07/2023 - on low dose decadron--finish 10 days total--Robitussin 5 mill every 4 as needed -Incentive spirometer every hour on the hour 5 times, he is compliant  Diarrhea -Probably secondary to viral illness COVID versus Paxlovid treatment for the illness - no C. 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  In the past CKD 3 A - Increased 5 mg to-->amlodipine 10, discontinued enalapril  Prior CAD status post PTCA -Aspirin 81 daily atorvastatin 40 daily nitroglycerin SL  DVT prophylaxis:  Code Status: DNR confirmed at the bedside Family Communication:  Disposition:  Status is: Observation The patient will require care spanning > 2 midnights and should be moved to inpatient because:   No family present today-patient requires another 24 to 48 hours to ensure stability prior to discharge home  Subjective:  Less cough, no sputum, no cp fever No n/v/sob   Objective: Vitals:   02/05/23 1451 02/05/23 1942 02/05/23 2000 02/06/23 0520  BP: 125/66 113/62 114/65 135/77  Pulse: 79 77 80 73  Resp: '16 18 17 16  '$ Temp: 98.4 F (36.9 C) 98.8 F (37.1 C) 98.3 F (36.8 C) 98.1 F (36.7 C)  TempSrc: Oral Oral Oral Oral  SpO2: 98% 97% 98% 99%  Weight:      Height:        Intake/Output Summary (Last 24 hours) at 02/06/2023 1808 Last data filed at 02/06/2023 1255 Gross per 24 hour  Intake 473 ml  Output --  Net 473 ml    Filed Weights   02/03/23 0930 02/03/23 1027  Weight: 63 kg 62.6 kg    Examination:  EOMI NCAT no focal deficit  S1-S2 no murmur  Decreased AE but seems about same as prior Abdomen  nontender no rebound no guarding No rash no lower extremity edema ROM intact no focal deficit Visual acuity diminished   Data Reviewed: personally reviewed   CBC    Component Value Date/Time  WBC 1.9 (L) 02/06/2023 0509   RBC 4.30 02/06/2023 0509   HGB 13.2 02/06/2023 0509   HGB 11.9 (L) 02/02/2023 1026   HCT 39.6 02/06/2023 0509   PLT 100 (L) 02/06/2023 0509   PLT 125 (L) 02/02/2023 1026   MCV 92.1 02/06/2023 0509   MCH 30.7 02/06/2023 0509   MCHC 33.3 02/06/2023 0509   RDW 12.6 02/06/2023 0509   LYMPHSABS 0.3 (L) 02/06/2023 0509   MONOABS 0.1 02/06/2023 0509   EOSABS 0.0  02/06/2023 0509   BASOSABS 0.0 02/06/2023 0509      Latest Ref Rng & Units 02/06/2023    5:09 AM 02/05/2023    9:01 AM 02/04/2023    5:59 AM  CMP  Glucose 70 - 99 mg/dL 255  101  86   BUN 8 - 23 mg/dL '20  15  23   '$ Creatinine 0.61 - 1.24 mg/dL 1.15  1.02  0.96   Sodium 135 - 145 mmol/L 135  137  140   Potassium 3.5 - 5.1 mmol/L 3.9  4.2  4.3   Chloride 98 - 111 mmol/L 101  107  108   CO2 22 - 32 mmol/L '25  24  23   '$ Calcium 8.9 - 10.3 mg/dL 8.4  8.2  8.1   Total Protein 6.5 - 8.1 g/dL  6.2  5.6   Total Bilirubin 0.3 - 1.2 mg/dL  1.1  0.6   Alkaline Phos 38 - 126 U/L  65  52   AST 15 - 41 U/L  37  30   ALT 0 - 44 U/L  26  23      Radiology Studies: No results found.   Scheduled Meds:  amLODipine  10 mg Oral Daily   aspirin EC  81 mg Oral QHS   [START ON 02/09/2023] atorvastatin  40 mg Oral QHS   benzonatate  200 mg Oral Once   dexamethasone  4 mg Oral Daily   loratadine  10 mg Oral Daily   menthol-cetylpyridinium  1 lozenge Oral TID   multivitamin  1 tablet Oral BID   nirmatrelvir/ritonavir (renal dosing)  2 tablet Oral BID   Continuous Infusions:   LOS: 2 days   Time spent: 26  Nita Sells, MD [facility] To contact the attending provider between 7A-7P or the covering provider during after hours 7P-7A, please log into the web site [email or web address] and access using universal [place] password for that web site. If you do not have the password, please call the hospital operator.  02/06/2023, 6:08 PM

## 2023-02-06 NOTE — Plan of Care (Signed)
Verbal education provided

## 2023-02-07 DIAGNOSIS — U071 COVID-19: Secondary | ICD-10-CM | POA: Diagnosis not present

## 2023-02-07 LAB — CBC WITH DIFFERENTIAL/PLATELET
Abs Immature Granulocytes: 0.04 10*3/uL (ref 0.00–0.07)
Basophils Absolute: 0 10*3/uL (ref 0.0–0.1)
Basophils Relative: 0 %
Eosinophils Absolute: 0 10*3/uL (ref 0.0–0.5)
Eosinophils Relative: 0 %
HCT: 40.6 % (ref 39.0–52.0)
Hemoglobin: 13.9 g/dL (ref 13.0–17.0)
Immature Granulocytes: 0 %
Lymphocytes Relative: 6 %
Lymphs Abs: 0.6 10*3/uL — ABNORMAL LOW (ref 0.7–4.0)
MCH: 31 pg (ref 26.0–34.0)
MCHC: 34.2 g/dL (ref 30.0–36.0)
MCV: 90.4 fL (ref 80.0–100.0)
Monocytes Absolute: 0.4 10*3/uL (ref 0.1–1.0)
Monocytes Relative: 4 %
Neutro Abs: 8.6 10*3/uL — ABNORMAL HIGH (ref 1.7–7.7)
Neutrophils Relative %: 90 %
Platelets: 154 10*3/uL (ref 150–400)
RBC: 4.49 MIL/uL (ref 4.22–5.81)
RDW: 12.4 % (ref 11.5–15.5)
WBC: 9.5 10*3/uL (ref 4.0–10.5)
nRBC: 0 % (ref 0.0–0.2)

## 2023-02-07 LAB — BASIC METABOLIC PANEL
Anion gap: 12 (ref 5–15)
BUN: 26 mg/dL — ABNORMAL HIGH (ref 8–23)
CO2: 22 mmol/L (ref 22–32)
Calcium: 8.8 mg/dL — ABNORMAL LOW (ref 8.9–10.3)
Chloride: 101 mmol/L (ref 98–111)
Creatinine, Ser: 1.32 mg/dL — ABNORMAL HIGH (ref 0.61–1.24)
GFR, Estimated: 51 mL/min — ABNORMAL LOW (ref >60)
Glucose, Bld: 169 mg/dL — ABNORMAL HIGH (ref 70–99)
Potassium: 3.7 mmol/L (ref 3.5–5.1)
Sodium: 135 mmol/L (ref 135–145)

## 2023-02-07 LAB — PROCALCITONIN: Procalcitonin: <0.1 ng/mL

## 2023-02-07 LAB — D-DIMER, QUANTITATIVE: D-Dimer, Quant: 0.68 ug/mL-FEU — ABNORMAL HIGH (ref 0.00–0.50)

## 2023-02-07 LAB — C-REACTIVE PROTEIN: CRP: 1.2 mg/dL — ABNORMAL HIGH (ref <1.0)

## 2023-02-07 NOTE — Progress Notes (Signed)
PROGRESS NOTE   QUI SIT  [id number] DOB: Feb 15, 1929 DOA: 02/03/2023 PCP: [name], MD  Brief Narrative:  89 year old white male known history of prostate cancer status post seeding, metastatic renal CA diagnosed in June 2003 T1 lesion at the time with negative margins with recurrence back in 2005 previously managed by Dr. Gaynelle Arabian who is since retired He still has mets -->psoas muscle status post XRT, status post left partial nephrectomy--prostate cancer status post seeding Hypertension as well as RAS-right-sided prior gallstone pancreatitis 2010 Macular degeneration Stage IIIa CKD CAD status post RCA stenting 1997, MI in 1993  WBC 5.2 hemoglobin 11.2 platelet 91,000 BUN/creatinine 23/1.3 AST/ALT 28/26, alk phos 69 total bili 0.8 EKG NSR prolonged PR PAC  Admitted for COVID-19 pneumonia Started on Paxlovid on admission but developed diarrhea from this apparently Diarrhea has resolved he has had some mild visual hallucinations (this is common for him) His wife is similarly aged and cannot take care of him at home Disposition is either AIR or SNF  Hospital-Problem based course  COVID-19 mild infection-Main symptom cough - crp down- Paxlovid 2 tabs twice daily complete course on 02/07/2023 - on low dose decadron--finish 10 days total--Robitussin 5 ml every 4 as needed - Incentive spirometer as able  Diarrhea -Probably secondary to viral illness COVID versus Paxlovid treatment for the illness - no C. 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  In the past CKD 3 A - Increased 5 mg to-->amlodipine 10, discontinued enalapril  Prior CAD status post PTCA -Aspirin 81 daily atorvastatin 40 daily nitroglycerin SL  DVT prophylaxis:  Code Status: DNR confirmed at the bedside Family Communication: On 3/4 discussed with [name] Disposition:  Status is: Observation The patient will require care spanning > 2 midnights and should be moved to inpatient because:   Discussed with family on 3/4 [sounds she has contracted COVID as well when I saw her]  will probably need acute inpatient rehab versus skilled  Subjective:  Looks well No family present but is doing okay at the bedside Note needed >+1 assist and given home scenario patient needs to go to rehab  Objective: Vitals:   02/06/23 0520 02/06/23 2023 02/07/23 0417 02/07/23 1355  BP: 135/77 (!) 147/67 (!) 149/74 116/83  Pulse: 73 (!) 103 78 97  Resp: '16  17 18  '$ Temp: 98.1 F (36.7 C) 98.2 F (36.8 C) 98 F (36.7 C) 98.3 F (36.8 C)  TempSrc: Oral Oral  Oral  SpO2: 99% 98% 100% 97%  Weight:      Height:        Intake/Output Summary (Last 24 hours) at 02/07/2023 1626 Last data filed at 02/07/2023 1621 Gross per 24 hour  Intake --  Output 500 ml  Net -500 ml    Filed Weights   02/03/23 0930 02/03/23 1027  Weight: 63 kg 62.6 kg  Examination:  EOMI NCAT no focal deficit  S1-S2 no murmur  Air entry good no wheeze rales rhonchi Abdomen nontender no rebound no guarding No rash no lower extremity edema ROM intact no focal deficit Visual acuity diminished   Data Reviewed: personally reviewed   CBC    Component Value Date/Time   WBC 9.5 02/07/2023 0544   RBC 4.49 02/07/2023 0544   HGB 13.9  02/07/2023 0544   HGB 11.9 (L) 02/02/2023 1026   HCT 40.6 02/07/2023 0544   PLT 154 02/07/2023 0544   PLT 125 (L) 02/02/2023 1026   MCV 90.4 02/07/2023 0544   MCH 31.0 02/07/2023 0544   MCHC 34.2 02/07/2023 0544   RDW 12.4 02/07/2023 0544   LYMPHSABS 0.6 (L) 02/07/2023 0544   MONOABS 0.4 02/07/2023 0544   EOSABS 0.0 02/07/2023 0544   BASOSABS 0.0 02/07/2023 0544      Latest Ref Rng & Units 02/07/2023    5:44 AM 02/06/2023    5:09 AM 02/05/2023    9:01 AM  CMP  Glucose 70 - 99 mg/dL 169  255  101   BUN 8 - 23 mg/dL '26  20  15   '$ Creatinine 0.61 - 1.24 mg/dL 1.32  1.15  1.02   Sodium 135 - 145 mmol/L 135  135  137   Potassium 3.5 - 5.1 mmol/L 3.7  3.9  4.2   Chloride 98 - 111 mmol/L 101  101  107   CO2 22 - 32 mmol/L '22  25  24   '$ Calcium 8.9 - 10.3 mg/dL 8.8  8.4  8.2   Total Protein 6.5 - 8.1 g/dL   6.2   Total Bilirubin 0.3 - 1.2 mg/dL   1.1   Alkaline Phos 38 - 126 U/L   65   AST 15 - 41 U/L   37   ALT 0 - 44 U/L   26      Radiology Studies: No results found.   Scheduled Meds:  amLODipine  10 mg Oral Daily   aspirin EC  81 mg Oral QHS   [START ON 02/09/2023] atorvastatin  40 mg Oral QHS   benzonatate  200 mg Oral Once   dexamethasone  4 mg Oral Daily   loratadine  10 mg Oral Daily   menthol-cetylpyridinium  1 lozenge Oral TID   multivitamin  1 tablet Oral BID   nirmatrelvir/ritonavir (renal dosing)  2 tablet Oral BID   Continuous Infusions:   LOS: 3 days   Time spent: [place], MD [facility] To contact the attending provider between 7A-7P or the covering provider during after hours 7P-7A, please log into the web site [email or web address] and access using universal [place] password for that web site. If you do not have the password, please call the hospital operator.  02/07/2023, 4:26 PM

## 2023-02-07 NOTE — TOC Initial Note (Signed)
Transition of Care [facility]) - Initial/Assessment Note    Patient Details  Name: Donald Todd MRN: [id number] Date of Birth: Mar 06, 1929  Transition of Care [facility]) CM/SW Contact:    Vassie Moselle, LCSW Phone Number: 02/07/2023, 2:16 PM  Clinical Narrative:                 Met with pt and spouse at bedside to discuss discharge recommendations. Pt and spouse would like pt to go to SNF vs return home w/ 24/7 care at this time as they would have to private pay for 24/7 care. Pt is currently COVID + as of 02/03/23 and will require 10-day isolation period for SNF with discharge date being 02/14/23. Pt is hopeful to make progress in hospital to be able to discharge home. Pt and spouse share that they would only want pt to to go [facility] or [facility] for SNF. CSW shared that both of these facilities stay full and is typically difficult to get placed due to no bed availability.  Referrals have been sent to [facility] for review.   CSW contacted by Urban Gibson with [facility] who is reviewing this pt for placement and would be able to accept with COVID + status. TOC will continue to follow.    Expected Discharge Plan: Skilled Nursing Facility Barriers to Discharge: SNF Covid   Patient Goals and CMS Choice Patient states their goals for this hospitalization and ongoing recovery are:: To go to [facility] CMS Medicare.gov Compare Post Acute Care list provided to:: Patient Choice offered to / list presented to : Patient, [place] ownership interest in [facility].provided to:: Patient    Expected Discharge Plan and Services In-house Referral: Clinical Social Work Discharge Planning Services: NA Post Acute Care Choice: [place] Living arrangements for the past 2 months: Single Family Home                 DME Arranged: N/A DME Agency: NA                  Prior Living Arrangements/Services Living arrangements for the past 2 months: Single  Family Home Lives with:: Spouse Patient language and need for interpreter reviewed:: Yes Do you feel safe going back to the place where you live?: Yes      Need for Family Participation in Patient Care: No (Comment) Care giver support system in place?: No (comment) Current home services: DME Criminal Activity/Legal Involvement Pertinent to Current Situation/Hospitalization: No - Comment as needed  Activities of Daily Living Home Assistive Devices/Equipment: Grab bars in shower, Walker (specify type) (3 wheel) ADL Screening (condition at time of admission) Patient's cognitive ability adequate to safely complete daily activities?: Yes Is the patient deaf or have difficulty hearing?: No Does the patient have difficulty seeing, even when wearing glasses/contacts?: Yes Does the patient have difficulty concentrating, remembering, or making decisions?: No Patient able to express need for assistance with ADLs?: Yes Does the patient have difficulty dressing or bathing?: Yes Independently performs ADLs?: Yes (appropriate for developmental age) Does the patient have difficulty walking or climbing stairs?: Yes Weakness of Legs: Both Weakness of Arms/Hands: None  Permission Sought/Granted Permission sought to share information with : Facility Sport and exercise psychologist, Family Supports Permission granted to share information with : Yes, Verbal Permission Granted  Share Information with NAME: Alonza Errico  Permission granted to share info w AGENCY: SNF's  Permission granted to share info w Relationship: Spouse  Permission granted to share info w Contact  Information: [phone]  Emotional Assessment Appearance:: Appears younger than stated age Attitude/Demeanor/Rapport: Engaged Affect (typically observed): Accepting Orientation: : Oriented to  Time, Oriented to Place, Oriented to Self, Oriented to Situation Alcohol / Substance Use: Not Applicable Psych Involvement: No (comment)  Admission  diagnosis:  Closed fracture of multiple ribs of left side, initial encounter [S22.42XA] COVID-19 virus infection [U07.1] COVID-19 [U07.1] Patient Active Problem List   Diagnosis Date Noted   COVID-19 02/04/2023   COVID-19 virus infection 02/03/2023   Bradycardia with 51-60 beats per minute 02/03/2023   Thrombocytopenia ([place]) 02/03/2023   Metastatic renal cell carcinoma to intra-abdominal site ([place]) 03/24/2021   Malignant neoplasm metastatic to right lung ([place]) 03/23/2021   Vertigo 06/18/2020   S/P shoulder replacement, left 05/26/2017   Syncope and collapse 04/03/2017   Abnormality of gait 09/07/2016   History of nephrectomy- Lt 06/16/2014   Chronic renal impairment, stage 3 (moderate) ([place]) 06/16/2014   CAD S/P percutaneous coronary angioplasty 05/15/2013   Essential hypertension 05/15/2013   Dyslipidemia 05/15/2013   PCP:  [name], MD Pharmacy:   [facility], [place] [address] Phone: [phone] Fax: [phone]  [id number] - [place], [place] [address] [place] [address] Phone: [phone] Fax: [phone]     Social Determinants of Health (SDOH) Social History: SDOH Screenings   Food Insecurity: No Food Insecurity (02/03/2023)  Housing: Low Risk  (02/03/2023)  Transportation Needs: No Transportation Needs (02/03/2023)  Utilities: Not At Risk (02/03/2023)  Tobacco Use: Medium Risk (02/03/2023)   SDOH Interventions:     Readmission Risk Interventions     No data to display

## 2023-02-07 NOTE — Evaluation (Addendum)
Physical Therapy Evaluation Patient Details Name: Donald Todd MRN: [id number] DOB: 11/07/1929 Today's Date: 02/07/2023  History of Present Illness  Donald Todd is a 89 y.o. male with medical history significant for metastatic renal cell carcinoma to intra-abdominal site (psoas muscle mass metastasis) status post radiation therapy, history of stage IIIa chronic kidney disease, status post left partial nephrectomy, hypertension, macular degeneration, coronary artery disease who presents to the emergency room for evaluation of a 2-day history of sore throat, cough, weakness and dizziness, Positive for Covid  Clinical Impression  Pt admitted with above diagnosis.  Pt currently with functional limitations due to the deficits listed below (see PT Problem List). Pt will benefit from skilled PT to increase their independence and safety with mobility to allow discharge to the venue listed below.    The patient presents with decreased balance and fall risk, ambulation/balance did improve with  increased ambulation.  Patient also demonstrated some confusion. No family present  Patient resides with wife who will not be able to assist patient as she uses a rollator. ? If daughter is available to assist Patient . Patient unable to go to a SNF at present due to + covid.  SPO2 95% on RA.            Recommendations for follow up therapy are one component of a multi-disciplinary discharge planning process, led by the attending physician.  Recommendations may be updated based on patient status, additional functional criteria and insurance authorization.  Follow Up Recommendations AIR  vs. 24/7 by caregivers Can patient physically be transported by private vehicle: Yes    Assistance Recommended at Discharge Frequent or constant Supervision/Assistance  Patient can return home with the following  A lot of help with walking and/or transfers;A little help with bathing/dressing/bathroom;Assistance with  cooking/housework;Assist for transportation;Help with stairs or ramp for entrance    Equipment Recommendations None recommended by PT  Recommendations for Other Services       Functional Status Assessment Patient has had a recent decline in their functional status and demonstrates the ability to make significant improvements in function in a reasonable and predictable amount of time.     Precautions / Restrictions Precautions Precautions: Fall Precaution Comments: legally blind      Mobility  Bed Mobility Overal bed mobility: Needs Assistance Bed Mobility: Supine to Sit     Supine to sit: Min assist     General bed mobility comments: extra time  and effort to raise to sitting, using bed rail, extra effort to scoot to bed edge    Transfers Overall transfer level: Needs assistance Equipment used: Rolling walker (2 wheels) Transfers: (P) Sit to/from Stand Sit to Stand: Mod assist           General transfer comment: mod  assistance for balance for standing from bed and recliner    Ambulation/Gait Ambulation/Gait assistance: Min assist, Mod assist Gait Distance (Feet): 20 Feet (then 45') Assistive device: Rolling walker (2 wheels) Gait Pattern/deviations: Step-to pattern, Step-through pattern, Staggering left, Staggering right Gait velocity: decr     General Gait Details: mod assistance for balance for first walk x 20', then min assist ance second walk x 4  Stairs            Wheelchair Mobility    Modified Rankin (Stroke Patients Only)       Balance Overall balance assessment: Needs assistance Sitting-balance support: Bilateral upper extremity supported, Feet supported Sitting balance-Leahy Scale: Fair     Standing balance support:  During functional activity, Bilateral upper extremity supported, Reliant on assistive device for balance Standing balance-Leahy Scale: Poor Standing balance comment: posterior bias, improved with  further ambulation                              Pertinent Vitals/Pain Pain Assessment Pain Assessment: No/denies pain    Home Living Family/patient expects to be discharged to:: Private residence Living Arrangements: Spouse/significant other;Children Available Help at Discharge: Available PRN/intermittently   Home Access: Stairs to enter   Entrance Stairs-Number of Steps: 1   Home Layout: Multi-level;Able to live on main level with bedroom/bathroom Home Equipment: Shower seat - built in (3 wheeled RW) Additional Comments: wife on a rollator, unable to assist patient, daughter lives close by    Prior Function               Mobility Comments: does not drive       Journalist, newspaper        Extremity/Trunk Assessment   Upper Extremity Assessment Upper Extremity Assessment: LUE deficits/detail LUE Deficits / Details: limited elevation( H/O RTSA)    Lower Extremity Assessment Lower Extremity Assessment: Generalized weakness    Cervical / Trunk Assessment Cervical / Trunk Assessment: Kyphotic  Communication      Cognition Arousal/Alertness: Awake/alert Behavior During Therapy: WFL for tasks assessed/performed Overall Cognitive Status: No family/caregiver present to determine baseline cognitive functioning Area of Impairment: Orientation                 Orientation Level: Disoriented to, Situation             General Comments: oriented x  4 but reports there  were teenagers in his room all night and he did not sleep, there were other people in  an auditorium, patient also thought that he had on pants        General Comments      Exercises     Assessment/Plan    PT Assessment Patient needs continued PT services  PT Problem List Decreased activity tolerance;Decreased strength;Decreased mobility;Decreased cognition;Decreased safety awareness;Decreased balance;Decreased knowledge of precautions       PT Treatment Interventions DME instruction;Therapeutic  activities;Balance training;Cognitive remediation;Functional mobility training;Patient/family education    PT Goals (Current goals can be found in the Care Plan section)  Acute Rehab PT Goals Patient Stated Goal: to go home PT Goal Formulation: With patient Time For Goal Achievement: 02/21/23 Potential to Achieve Goals: Good    Frequency Min 3X/week     Co-evaluation               AM-PAC PT "6 Clicks" Mobility  Outcome Measure Help needed turning from your back to your side while in a flat bed without using bedrails?: A Lot Help needed moving from lying on your back to sitting on the side of a flat bed without using bedrails?: A Lot Help needed moving to and from a bed to a chair (including a wheelchair)?: A Lot Help needed standing up from a chair using your arms (e.g., wheelchair or bedside chair)?: A Lot Help needed to walk in hospital room?: A Lot Help needed climbing 3-5 steps with a railing? : Total 6 Click Score: 11    End of Session Equipment Utilized During Treatment: Gait belt Activity Tolerance: Patient tolerated treatment well Patient left: in chair;with chair alarm set;with call bell/phone within reach Nurse Communication: Mobility status PT Visit Diagnosis: Unsteadiness on feet (R26.81);History of falling (Z91.81);Difficulty  in walking, not elsewhere classified (R26.2);Dizziness and giddiness (R42)    Time: [id number] PT Time Calculation (min) (ACUTE ONLY): 36 min   Charges:   PT Evaluation $PT Eval Low Complexity: 1 Low PT Treatments $Gait Training: 8-22 mins        [place] Office [phone] Weekend [id number]   Claretha Cooper 02/07/2023, 1:08 PM

## 2023-02-07 NOTE — NC FL2 (Signed)
[place] LEVEL OF CARE FORM     IDENTIFICATION  Patient Name: Donald Todd Birthdate: [birth year] Sex: male Admission Date (Current Location): 02/03/2023  [facility] and [place] Number:  Herbalist and Address:  [facility],  [place], [place]      Provider Number: [id number]  Attending Physician Name and Address:  Nita Sells, MD  Relative Name and Phone Number:  Isaha, Contorno [phone]    Current Level of Care: Hospital Recommended Level of Care: [place] Prior Approval Number:    Date Approved/Denied:   PASRR Number: [id number]  Discharge Plan: SNF    Current Diagnoses: Patient Active Problem List   Diagnosis Date Noted   COVID-19 02/04/2023   COVID-19 virus infection 02/03/2023   Bradycardia with 51-60 beats per minute 02/03/2023   Thrombocytopenia ([place]) 02/03/2023   Metastatic renal cell carcinoma to intra-abdominal site ([place]) 03/24/2021   Malignant neoplasm metastatic to right lung ([place]) 03/23/2021   Vertigo 06/18/2020   S/P shoulder replacement, left 05/26/2017   Syncope and collapse 04/03/2017   Abnormality of gait 09/07/2016   History of nephrectomy- Lt 06/16/2014   Chronic renal impairment, stage 3 (moderate) ([place]) 06/16/2014   CAD S/P percutaneous coronary angioplasty 05/15/2013   Essential hypertension 05/15/2013   Dyslipidemia 05/15/2013    Orientation RESPIRATION BLADDER Height & Weight     Self, Time, Situation, Place  Normal Continent Weight: 138 lb (62.6 kg) Height:  '5\' 7"'$  (170.2 cm)  BEHAVIORAL SYMPTOMS/MOOD NEUROLOGICAL BOWEL NUTRITION STATUS      Continent Diet (Regular)  AMBULATORY STATUS COMMUNICATION OF NEEDS Skin   Limited Assist Verbally Normal                       Personal Care Assistance Level of Assistance  Bathing, Feeding, Dressing Bathing Assistance: Limited assistance Feeding assistance: Independent Dressing Assistance:  Limited assistance     Functional Limitations Info  Sight, Hearing, Speech Sight Info: Impaired Hearing Info: Adequate Speech Info: Adequate    SPECIAL CARE FACTORS FREQUENCY  PT (By licensed PT), OT (By licensed OT)     PT Frequency: 5x/wk OT Frequency: 5x/wk            Contractures Contractures Info: Not present    Additional Factors Info  Code Status, Allergies Code Status Info: DNR Allergies Info: Tape, Hydrocodone, Iohexol, Oxycodone           Current Medications (02/07/2023):  This is the current hospital active medication list Current Facility-Administered Medications  Medication Dose Route Frequency Provider Last Rate Last Admin   acetaminophen (TYLENOL) tablet 1,000 mg  1,000 mg Oral Q6H PRN Agbata, Tochukwu, MD   1,000 mg at 02/03/23 2236   amLODipine (NORVASC) tablet 10 mg  10 mg Oral Daily Nita Sells, MD   10 mg at 02/07/23 [id number]   aspirin EC tablet 81 mg  81 mg Oral QHS Agbata, Tochukwu, MD   81 mg at 02/06/23 2142   [START ON 02/09/2023] atorvastatin (LIPITOR) tablet 40 mg  40 mg Oral QHS Agbata, Tochukwu, MD       benzonatate (TESSALON) capsule 200 mg  200 mg Oral Once Small, Brooke L, PA       dexamethasone (DECADRON) tablet 4 mg  4 mg Oral Daily Nita Sells, MD   4 mg at 02/07/23 0938   guaiFENesin-dextromethorphan (ROBITUSSIN DM) 100-10 MG/5ML syrup 5 mL  5 mL Oral Q4H PRN Collier Bullock, MD  5 mL at 02/06/23 0243   ipratropium (ATROVENT) 0.03 % nasal spray 1-2 spray  1-2 spray Each Nare BID PRN Agbata, Tochukwu, MD       loratadine (CLARITIN) tablet 10 mg  10 mg Oral Daily Agbata, Tochukwu, MD   10 mg at 02/07/23 [id number]   meclizine (ANTIVERT) tablet 25 mg  25 mg Oral PRN Agbata, Tochukwu, MD   25 mg at 02/05/23 2118   menthol-cetylpyridinium (CEPACOL) lozenge 3 mg  1 lozenge Oral TID Collier Bullock, MD   3 mg at 02/07/23 [id number]   multivitamin (PROSIGHT) tablet 1 tablet  1 tablet Oral BID Agbata, Tochukwu, MD   1 tablet at 02/07/23 [id number]    nirmatrelvir/ritonavir (renal dosing) (PAXLOVID) 2 tablet  2 tablet Oral BID Tawnya Crook, RPH   2 tablet at 02/07/23 [id number]   nitroGLYCERIN (NITROSTAT) SL tablet 0.4 mg  0.4 mg Sublingual PRN Agbata, Tochukwu, MD       ondansetron (ZOFRAN) tablet 4 mg  4 mg Oral Q6H PRN Agbata, Tochukwu, MD       Or   ondansetron (ZOFRAN) injection 4 mg  4 mg Intravenous Q6H PRN Agbata, Tochukwu, MD       phenol (CHLORASEPTIC) mouth spray 1 spray  1 spray Mouth/Throat PRN Agbata, Tochukwu, MD   1 spray at 02/04/23 0610     Discharge Medications: Please see discharge summary for a list of discharge medications.  Relevant Imaging Results:  Relevant Lab Results:   Additional Information SSN: [id number]   COVID + as of 3/1. Will complete 10-day isolation on 3/11.  Vassie Moselle, LCSW

## 2023-02-08 DIAGNOSIS — U071 COVID-19: Secondary | ICD-10-CM | POA: Diagnosis not present

## 2023-02-08 LAB — CBC WITH DIFFERENTIAL/PLATELET
Abs Immature Granulocytes: 0.04 10*3/uL (ref 0.00–0.07)
Basophils Absolute: 0 10*3/uL (ref 0.0–0.1)
Basophils Relative: 0 %
Eosinophils Absolute: 0 10*3/uL (ref 0.0–0.5)
Eosinophils Relative: 0 %
HCT: 36.9 % — ABNORMAL LOW (ref 39.0–52.0)
Hemoglobin: 12.2 g/dL — ABNORMAL LOW (ref 13.0–17.0)
Immature Granulocytes: 0 %
Lymphocytes Relative: 4 %
Lymphs Abs: 0.4 10*3/uL — ABNORMAL LOW (ref 0.7–4.0)
MCH: 30 pg (ref 26.0–34.0)
MCHC: 33.1 g/dL (ref 30.0–36.0)
MCV: 90.9 fL (ref 80.0–100.0)
Monocytes Absolute: 0.5 10*3/uL (ref 0.1–1.0)
Monocytes Relative: 5 %
Neutro Abs: 8.7 10*3/uL — ABNORMAL HIGH (ref 1.7–7.7)
Neutrophils Relative %: 91 %
Platelets: 151 10*3/uL (ref 150–400)
RBC: 4.06 MIL/uL — ABNORMAL LOW (ref 4.22–5.81)
RDW: 12.6 % (ref 11.5–15.5)
WBC: 9.6 10*3/uL (ref 4.0–10.5)
nRBC: 0 % (ref 0.0–0.2)

## 2023-02-08 LAB — BASIC METABOLIC PANEL
Anion gap: 8 (ref 5–15)
BUN: 37 mg/dL — ABNORMAL HIGH (ref 8–23)
CO2: 24 mmol/L (ref 22–32)
Calcium: 8.4 mg/dL — ABNORMAL LOW (ref 8.9–10.3)
Chloride: 103 mmol/L (ref 98–111)
Creatinine, Ser: 1.47 mg/dL — ABNORMAL HIGH (ref 0.61–1.24)
GFR, Estimated: 45 mL/min — ABNORMAL LOW (ref >60)
Glucose, Bld: 131 mg/dL — ABNORMAL HIGH (ref 70–99)
Potassium: 4.4 mmol/L (ref 3.5–5.1)
Sodium: 135 mmol/L (ref 135–145)

## 2023-02-08 LAB — C-REACTIVE PROTEIN: CRP: 0.6 mg/dL (ref <1.0)

## 2023-02-08 LAB — D-DIMER, QUANTITATIVE: D-Dimer, Quant: 0.6 ug/mL-FEU — ABNORMAL HIGH (ref 0.00–0.50)

## 2023-02-08 MED ORDER — DEXAMETHASONE 4 MG PO TABS
2.0000 mg | ORAL_TABLET | Freq: Every day | ORAL | Status: DC
  Administered 2023-02-09 – 2023-02-10 (×2): 2 mg via ORAL
  Filled 2023-02-08 (×2): qty 1

## 2023-02-08 MED ORDER — DEXAMETHASONE 4 MG PO TABS: 2.0000 mg | ORAL_TABLET | Freq: Every day | ORAL | Status: DC

## 2023-02-08 MED ORDER — HALOPERIDOL LACTATE 5 MG/ML IJ SOLN
2.0000 mg | Freq: Four times a day (QID) | INTRAMUSCULAR | Status: AC | PRN
  Administered 2023-02-08 – 2023-02-09 (×2): 2 mg via INTRAMUSCULAR
  Filled 2023-02-08 (×2): qty 1

## 2023-02-08 MED ORDER — HALOPERIDOL LACTATE 5 MG/ML IJ SOLN: 2.0000 mg | Freq: Four times a day (QID) | INTRAMUSCULAR | Status: DC | PRN

## 2023-02-08 NOTE — Evaluation (Signed)
Occupational Therapy Evaluation Patient Details Name: Donald Todd MRN: [id number] DOB: 02-24-1929 Today's Date: 02/08/2023   History of Present Illness Donald Todd is a 89 y.o. 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 On evaluation he is supervision to transfer into sitting, min assist to stand and min assist to steady with walker with in room ambulation. He had one posterior LOB in the bathroom. He needs increased assistance with ADLs due to poor balance and confusion - needing verbal and tactile cues to motor plan and min assist for steadying. Patient will benefit from skilled OT services while in hospital to improve deficits and learn compensatory strategies as needed in order to return to PLOF.        Recommendations for follow up therapy are one component of a multi-disciplinary discharge planning process, led by the attending physician.  Recommendations may be updated based on patient status, additional functional criteria and insurance authorization.   Follow Up Recommendations  Other (comment) (AIR vs SNF)     Assistance Recommended at Discharge Frequent or constant Supervision/Assistance  Patient can return home with the following A little help with walking and/or transfers;A lot of help with bathing/dressing/bathroom;Assistance with cooking/housework;Direct supervision/assist for medications management;Assist for transportation;Help with  stairs or ramp for entrance;Direct supervision/assist for financial management    Functional Status Assessment  Patient has had a recent decline in their functional status and demonstrates the ability to make significant improvements in function in a reasonable and predictable amount of time.  Equipment Recommendations  None recommended by OT    Recommendations for Other Services       Precautions / Restrictions Precautions Precautions: Fall Precaution Comments: legally blind, confused Restrictions Weight Bearing Restrictions: No      Mobility Bed Mobility Overal bed mobility: Needs Assistance Bed Mobility: Supine to Sit     Supine to sit: Supervision, HOB elevated          Transfers Overall transfer level: Needs assistance     Sit to Stand: Min assist           General transfer comment: Min assist to power up from low toilet surface. Min assist for steadying with in room ambulation with one LOB in bathroom.      Balance Overall balance assessment: Needs assistance Sitting-balance support: No upper extremity supported, Feet supported Sitting balance-Leahy Scale: Fair     Standing balance support: During functional activity, Bilateral upper extremity supported, Reliant on assistive device for balance Standing balance-Leahy Scale: Poor                             ADL either performed or assessed with clinical judgement   ADL Overall ADL's : Needs assistance/impaired Eating/Feeding: Independent   Grooming: Set up;Sitting   Upper Body Bathing: Set up;Sitting   Lower Body Bathing: Minimal assistance;Sit to/from stand   Upper Body Dressing : Set up;Sitting   Lower Body Dressing: Minimal assistance;Sit to/from stand Lower Body Dressing Details (indicate cue type and reason):  min assist to don shoes Toilet Transfer: Minimal assistance;Regular Toilet;Rolling walker (2 wheels) Toilet Transfer Details (indicate cue type and reason): min assist to  power up from toilet Toileting- Clothing Manipulation and Hygiene: Moderate assistance;Sit to/from stand Toileting - Clothing Manipulation Details (indicate cue type and reason): mod assist for toileting     Functional mobility during ADLs: Minimal assistance;Rolling walker (2 wheels)       Vision Patient Visual Report: No change from baseline       Perception     Praxis      Pertinent Vitals/Pain Pain Assessment Pain Assessment: No/denies pain     Hand Dominance Right   Extremity/Trunk Assessment Upper Extremity Assessment Upper Extremity Assessment: Overall WFL for tasks assessed   Lower Extremity Assessment Lower Extremity Assessment: Defer to PT evaluation   Cervical / Trunk Assessment Cervical / Trunk Assessment: Kyphotic   Communication Communication Communication: No difficulties   Cognition Arousal/Alertness: Awake/alert Behavior During Therapy: Restless Overall Cognitive Status: No family/caregiver present to determine baseline cognitive functioning                   Orientation Level: Disoriented to, Place, Time, Situation             General Comments: Only alert to himself today. Thinks he is at [facility] and his conversation is rambling and nonsensical most of the time     General Comments       Exercises     Shoulder Instructions      Home Living Family/patient expects to be discharged to:: Private residence Living Arrangements: Spouse/significant other;Children Available Help at Discharge: Available PRN/intermittently   Home Access: Stairs to enter Entrance Stairs-Number of Steps: 1   Home Layout: Multi-level;Able to live on main level with bedroom/bathroom   Alternate Level Stairs-Rails: Left Bathroom Shower/Tub: Occupational psychologist: Handicapped height     Home Equipment: Shower seat - built in (3 wheeled RW)   Additional Comments: wife on a rollator, unable to assist patient, daughter lives close by       Prior Functioning/Environment               Mobility Comments: does not drive          OT Problem List: Decreased strength;Decreased activity tolerance;Impaired balance (sitting and/or standing);Impaired vision/perception;Decreased safety awareness;Decreased knowledge of use of DME or AE;Decreased cognition      OT Treatment/Interventions: Self-care/ADL training;Therapeutic exercise;DME and/or AE instruction;Therapeutic activities;Balance training;Patient/family education;Cognitive remediation/compensation    OT Goals(Current goals can be found in the care plan section) Acute Rehab OT Goals OT Goal Formulation: Patient unable to participate in goal setting Time For Goal Achievement: 02/22/23 Potential to Achieve Goals: Good  OT Frequency: Min 2X/week    Co-evaluation              AM-PAC OT "6 Clicks" Daily Activity     Outcome Measure Help from another person eating meals?: None Help from another person taking care of personal grooming?: A Little Help from another person toileting, which includes using toliet, bedpan, or urinal?: A Lot Help from another person bathing (including washing, rinsing, drying)?: A Little Help from another person to put on and taking off regular upper body clothing?: A Little Help from another person to put on and taking off regular lower body clothing?: A Little 6 Click Score: 18   End of Session Equipment Utilized During Treatment: Rolling walker (2 wheels) Nurse Communication: Mobility status  Activity Tolerance: Patient tolerated treatment well  Patient left: in chair;with call bell/phone within reach;with chair alarm set  OT Visit Diagnosis: Unsteadiness on feet (R26.81);Other symptoms and signs involving cognitive function                Time: [id number] OT Time Calculation (min): 17 min Charges:  OT General Charges $OT Visit: 1 Visit OT Evaluation $OT Eval Low Complexity: 1 Low  Gustavo Lah, OTR/L [place]   Office [phone]   Lenward Chancellor 02/08/2023, 10:20 AM

## 2023-02-08 NOTE — Progress Notes (Signed)
PROGRESS NOTE  Donald Todd  [id number] DOB: 30-Nov-1929 DOA: 02/03/2023 PCP: [name], MD   Brief Narrative: Patient is a 89 year old male with history of prostate cancer status post seeding, metastatic renal cancer, hypertension, stage III CKD, coronary artery disease status post RCA stenting who presented with 2-day history of sore throat, cough, weakness.  COVID screen test was positive on presentation.  Admitted for symptomatic COVID infection.  Started on steroids, Paxlovid.  PT/OT recommending acute inpatient rehab versus skilled nursing facility.  TOC following.  Medically stable for discharge whenever possible.  Assessment & Plan:  Principal Problem:   COVID-19 virus infection Active Problems:   CAD S/P percutaneous coronary angioplasty   Essential hypertension   History of nephrectomy- Lt   Chronic renal impairment, stage 3 (moderate) (HCC)   Malignant neoplasm metastatic to right lung (HCC)   Metastatic renal cell carcinoma to intra-abdominal site (HCC)   Bradycardia with 51-60 beats per minute   Thrombocytopenia ([place])   COVID-19  COVID infection: Presented with sore throat, cough, weakness.  Chest x-ray on presentation did not show any pneumonia.  Started on Paxlovid, recent course.  Currently on tapering Decadron.  Inflammatory markers improving.  Currently on room air.  Continue supportive care with cough medications, incentive spirometer.  Denies shortness of breath or cough.  Diarrhea: Likely secondary to viral illness.  resolved with  Bradycardia: Resolved.  Beta-blocker discontinued  Leukopenia: Most likely from [place].  Resolved  Chronic dizziness/weakness: Also has macular degeneration.  Continue meclizine as needed for dizziness.  Complains of dizziness on ambulation.  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  [facility].  History of hypertension: Currently on amlodipine 10 mg daily.  Lisinopril discontinued  History of renal artery stenosis, CKD stage IIIa: Currently kidney function at baseline.  Baseline creatinine around 1.3-1.5.  History of coronary artery disease: Aspirin, Lipitor.  Status post right PCI  Debility/deconditioning: Patient seen by PT/OT, recommended acute inpatient rehab versus SNF. CODE STATUS DNR.        DVT prophylaxis:Place and maintain sequential compression device Start: 02/03/23 1614     Code Status: DNR  Family Communication: None at bedside  Patient status:Inpatient  Patient is from :Home  Anticipated discharge to:AIR vs SNF  Estimated DC date:as soon as bed is available   Consultants: None  Procedures:None  Antimicrobials:  Anti-infectives (From admission, onward)    Start     Dose/Rate Route Frequency Ordered Stop   02/03/23 2200  nirmatrelvir/ritonavir (PAXLOVID) 3 tablet  Status:  Discontinued        3 tablet Oral 2 times daily 02/03/23 1546 02/03/23 1553   02/03/23 2200  nirmatrelvir/ritonavir (renal dosing) (PAXLOVID) 2 tablet        2 tablet Oral 2 times daily 02/03/23 1553 02/08/23 0916       Subjective:  Patient seen and examined at bedside today.  Hemodynamically stable.  On room air.  Lying in bed.  He said he became dizzy when he walked this morning.  Denies any nausea or vomiting.  No abdomen, diarrhea.  No shortness of breath or cough.  Objective: Vitals:   02/06/23 2023 02/07/23 0417 02/07/23 1355 02/08/23 0705  BP: (!) 147/67 (!) 149/74 116/83 (!) 149/74  Pulse: (!) 103 78 97 98  Resp:  '17 18 16  '$ Temp: 98.2 F (36.8 C) 98 F (36.7 C) 98.3 F (36.8 C) 98.3 F (  36.8 C)  TempSrc: Oral  Oral Oral  SpO2: 98% 100% 97% 99%  Weight:      Height:        Intake/Output Summary (Last 24 hours) at 02/08/2023 1021 Last data filed at 02/07/2023 1621 Gross per 24 hour  Intake --  Output 500 ml  Net -500 ml   Filed Weights    02/03/23 0930 02/03/23 1027  Weight: 63 kg 62.6 kg    Examination:  General exam: Overall comfortable, not in distress,pleasant elderly male HEENT: PERRL Respiratory system:  no wheezes or crackles  Cardiovascular system: S1 & S2 heard, RRR.  Gastrointestinal system: Abdomen is nondistended, soft and nontender. Central nervous system: Alert and oriented Extremities: No edema, no clubbing ,no cyanosis Skin: No rashes, no ulcers,no icterus     Data Reviewed: I have personally reviewed following labs and imaging studies  CBC: Recent Labs  Lab 02/03/23 1002 02/04/23 0559 02/05/23 0901 02/06/23 0509 02/07/23 0544 02/08/23 0541  WBC 5.2 4.0 3.0* 1.9* 9.5 9.6  NEUTROABS 4.0  --  1.9 1.5* 8.6* 8.7*  HGB 11.2* 10.9* 12.5* 13.2 13.9 12.2*  HCT 34.5* 34.9* 37.9* 39.6 40.6 36.9*  MCV 93.5 96.4 92.9 92.1 90.4 90.9  PLT 91* 84* 100* 100* 154 [id number]   Basic Metabolic Panel: Recent Labs  Lab 02/04/23 0559 02/05/23 0901 02/06/23 0509 02/07/23 0544 02/08/23 0541  NA 140 137 135 135 135  K 4.3 4.2 3.9 3.7 4.4  CL 108 107 101 101 103  CO2 '23 24 25 22 24  '$ GLUCOSE 86 101* 255* 169* 131*  BUN '23 15 20 '$ 26* 37*  CREATININE 0.96 1.02 1.15 1.32* 1.47*  CALCIUM 8.1* 8.2* 8.4* 8.8* 8.4*     Recent Results (from the past 240 hour(s))  Resp panel by RT-PCR (RSV, Flu A&B, Covid) Anterior Nasal Swab     Status: Abnormal   Collection Time: 02/03/23  9:33 AM   Specimen: Anterior Nasal Swab  Result Value Ref Range Status   SARS Coronavirus 2 by RT PCR POSITIVE (A) NEGATIVE Final    Comment: (NOTE) SARS-CoV-2 target nucleic acids are DETECTED.  The SARS-CoV-2 RNA is generally detectable in upper respiratory specimens during the acute phase of infection. Positive results are indicative of the presence of the identified virus, but do not rule out bacterial infection or co-infection with other pathogens not detected by the test. Clinical correlation with patient history and other diagnostic  information is necessary to determine patient infection status. The expected result is Negative.  Fact Sheet for Patients: EntrepreneurPulse.com.au  Fact Sheet for Healthcare Providers: IncredibleEmployment.be  This test is not yet approved or cleared by the [place] FDA and  has been authorized for detection and/or diagnosis of SARS-CoV-2 by FDA under an Emergency Use Authorization (EUA).  This EUA will remain in effect (meaning this test can be used) for the duration of  the COVID-19 declaration under Section 564(b)(1) of the A ct, 21 U.S.C. section 360bbb-3(b)(1), unless the authorization is terminated or revoked sooner.     Influenza A by PCR NEGATIVE NEGATIVE Final   Influenza B by PCR NEGATIVE NEGATIVE Final    Comment: (NOTE) The Xpert Xpress SARS-CoV-2/FLU/RSV plus assay is intended as an aid in the diagnosis of influenza from Nasopharyngeal swab specimens and should not be used as a sole basis for treatment. Nasal washings and aspirates are unacceptable for Xpert Xpress SARS-CoV-2/FLU/RSV testing.  Fact Sheet for Patients: EntrepreneurPulse.com.au  Fact Sheet for Healthcare Providers: IncredibleEmployment.be  This test  is not yet approved or cleared by the [place] and has been authorized for detection and/or diagnosis of SARS-CoV-2 by FDA under an Emergency Use Authorization (EUA). This EUA will remain in effect (meaning this test can be used) for the duration of the COVID-19 declaration under Section 564(b)(1) of the Act, 21 U.S.C. section 360bbb-3(b)(1), unless the authorization is terminated or revoked.     Resp Syncytial Virus by PCR NEGATIVE NEGATIVE Final    Comment: (NOTE) Fact Sheet for Patients: EntrepreneurPulse.com.au  Fact Sheet for Healthcare Providers: IncredibleEmployment.be  This test is not yet approved or cleared by the  [place] FDA and has been authorized for detection and/or diagnosis of SARS-CoV-2 by FDA under an Emergency Use Authorization (EUA). This EUA will remain in effect (meaning this test can be used) for the duration of the COVID-19 declaration under Section 564(b)(1) of the Act, 21 U.S.C. section 360bbb-3(b)(1), unless the authorization is terminated or revoked.  Performed at [facility], [address] [place], [place] [address]      Radiology Studies: No results found.  Scheduled Meds:  amLODipine  10 mg Oral Daily   aspirin EC  81 mg Oral QHS   [START ON 02/09/2023] atorvastatin  40 mg Oral QHS   benzonatate  200 mg Oral Once   dexamethasone  2 mg Oral Daily   loratadine  10 mg Oral Daily   menthol-cetylpyridinium  1 lozenge Oral TID   multivitamin  1 tablet Oral BID   Continuous Infusions:   LOS: 4 days   Shelly Coss, MD [facility] P3/05/2023, 10:21 AM

## 2023-02-08 NOTE — Progress Notes (Signed)
Inpatient Rehab Admissions Coordinator:   Left message for spouse to discuss rehab recommendations.   Shann Medal, PT, DPT Admissions Coordinator [phone] 02/08/23  4:33 PM

## 2023-02-08 NOTE — Progress Notes (Signed)
Inpatient Rehab Admissions Coordinator:   Pt was screened for CIR candidacy by Shann Medal, pT, DPT.  At this time we are recommending a CIR consult and I will place an order per our protocol.    Shann Medal, PT, DPT Admissions Coordinator [phone] 02/08/23  2:55 PM

## 2023-02-08 NOTE — Progress Notes (Signed)
       CROSS COVER NOTE  NAME: Donald Todd MRN: [id number] DOB : 09/18/1929    Date of Service   02/08/2023     HPI/Events of Note   Notified by nursing that patient is experiencing increased agitation, restlessness and actively hallucinating.    Patient has successfully exited bed multiple times without assistance and has been leaving room unassisted.  High fall risk.  Sitter is not available at this time.  Unfortunately, patient is not following commands despite multiple attempts at redirection by staff, therefore not a candidate for TeleSitter.   Will trial low-dose IM Haldol for agitation.   For the safety of the patient, lapbelt restraint has been placed. Restraint to be discontinued as soon as patient is able to safely have them removed.    Interventions/ Plan   IM Haldol Restraint       Raenette Rover, DNP, [place]

## 2023-02-09 ENCOUNTER — Ambulatory Visit: Payer: Medicare Other | Admitting: Oncology

## 2023-02-09 DIAGNOSIS — U071 COVID-19: Secondary | ICD-10-CM | POA: Diagnosis not present

## 2023-02-09 LAB — BASIC METABOLIC PANEL
Anion gap: 7 (ref 5–15)
BUN: 45 mg/dL — ABNORMAL HIGH (ref 8–23)
CO2: 26 mmol/L (ref 22–32)
Calcium: 8.4 mg/dL — ABNORMAL LOW (ref 8.9–10.3)
Chloride: 102 mmol/L (ref 98–111)
Creatinine, Ser: 1.55 mg/dL — ABNORMAL HIGH (ref 0.61–1.24)
GFR, Estimated: 42 mL/min — ABNORMAL LOW (ref >60)
Glucose, Bld: 156 mg/dL — ABNORMAL HIGH (ref 70–99)
Potassium: 4.4 mmol/L (ref 3.5–5.1)
Sodium: 135 mmol/L (ref 135–145)

## 2023-02-09 MED ORDER — QUETIAPINE FUMARATE 25 MG PO TABS
25.0000 mg | ORAL_TABLET | Freq: Every day | ORAL | Status: DC
  Administered 2023-02-09: 25 mg via ORAL
  Filled 2023-02-09: qty 1

## 2023-02-09 NOTE — PMR Pre-admission (Signed)
PMR Admission Coordinator Pre-Admission Assessment  Patient: Donald Todd is an 89 y.o., male MRN: [id number] DOB: 1929/11/05 Height: '5\' 7"'$  (170.2 cm) Weight: 62.6 kg  Insurance Information HMO:     PPO:      PCP:      IPA:      80/20:      OTHER:  PRIMARY: medicare A/B      Policy#: [id number]      Subscriber: pt CM Name:       Phone#:      Fax#:  Pre-Cert#: verified Civil engineer, contracting:  Benefits:  Phone #:      Name:  Eff. Date: A/B 02/03/96     Deduct: $1632      Out of Pocket Max: n/a      Life Max:  CIR: 100%      SNF: 20 full days Outpatient: 80%     Co-Pay: 20% Home Health: 100%      Co-Pay:  DME: 80%     Co-Pay: 20% Providers:  SECONDARY: Tricare for Life      Policy#: [id number]     Phone#: [phone]  Financial Counselor:       Phone#:   The "Data Collection Information Summary" for patients in Inpatient Rehabilitation Facilities with attached "Privacy Act [place] Records" was provided and verbally reviewed with: Patient and Family  Emergency Contact Information Contact Information     Name Relation Home Work Mobile   Donald Todd Spouse   [phone]   Donald Todd   [place] Daughter [phone]         Current Medical History  Patient Admitting Diagnosis: debility 2/2 covid infection  History of Present Illness: Pt is a 89 y/o male with PMH of renal cell carcinoma with mets to the psoas muscle s/p radiation therapy, stage IIIa CKD, L partial nephrectomy, HTN, macular degeneration, CAD, who presents to [facility] ED on 02/03/23 with 2 day history of sore throat, cough, weakness, and dizziness.  In ED, vitals and labs relatively unremarkable except platelets 91K, creatinine 1.32 and calcium 8.1.  Pt found to be covid + and was started on paxlovid and decadron taper.  Lovenox held in the setting of thrombocytopenia.  Pt with episode of bradycardia for which cardiology was consulted and they discontinued his beta blocker.  Pt with  chronic dizziness, and was started on meclizine PRN.  Hospital course complicated by delirium, pt started on low dose seroquel.  Therapy ongoing and recommendations are for CIR to return to baseline.     Patient's medical record from [facility] has been reviewed by the rehabilitation admission coordinator and physician.  Past Medical History  Past Medical History:  Diagnosis Date   Arthritis    CAD (coronary artery disease)    last cath. 07-09-2010   Cancer [facility])    kidney cancer , prostate cancer    Chronic kidney disease    lt kidnet removed   H/O cardiovascular stress test 08-04-2010   ef 55% NO ISCHEMIA   H/O unilateral nephrectomy    lt. nephrectomy   Heart attack ([place]) 1993   History of kidney stones    Hyperlipemia    Hypertension    renal dopplers 218.13-normal patency   Inguinal hernia 08/01/11   right   Macular degeneration    Prostate cancer [facility])    Prostate disease    Cancer S/P seed implant   Renal mass    Vertigo  Has the patient had major surgery during 100 days prior to admission? No  Family History   family history includes Cancer in his mother; Skin cancer in his brother; Stroke in his brother and father.  Current Medications  Current Facility-Administered Medications:    acetaminophen (TYLENOL) tablet 1,000 mg, 1,000 mg, Oral, Q6H PRN, Agbata, Tochukwu, MD, 1,000 mg at 02/03/23 2236   amLODipine (NORVASC) tablet 10 mg, 10 mg, Oral, Daily, Verlon Au, Jai-Gurmukh, MD, 10 mg at 02/09/23 0909   aspirin EC tablet 81 mg, 81 mg, Oral, QHS, Agbata, Tochukwu, MD, 81 mg at 02/07/23 2144   atorvastatin (LIPITOR) tablet 40 mg, 40 mg, Oral, QHS, Agbata, Tochukwu, MD   dexamethasone (DECADRON) tablet 2 mg, 2 mg, Oral, Daily, Adhikari, Amrit, MD, 2 mg at 02/09/23 0909   guaiFENesin-dextromethorphan (ROBITUSSIN DM) 100-10 MG/5ML syrup 5 mL, 5 mL, Oral, Q4H PRN, Agbata, Tochukwu, MD, 5 mL at 02/06/23 0243   ipratropium (ATROVENT) 0.03 % nasal spray 1-2 spray, 1-2  spray, Each Nare, BID PRN, Agbata, Tochukwu, MD   loratadine (CLARITIN) tablet 10 mg, 10 mg, Oral, Daily, Agbata, Tochukwu, MD, 10 mg at 02/09/23 [id number]   meclizine (ANTIVERT) tablet 25 mg, 25 mg, Oral, PRN, Agbata, Tochukwu, MD, 25 mg at 02/08/23 [id number]   menthol-cetylpyridinium (CEPACOL) lozenge 3 mg, 1 lozenge, Oral, TID, Agbata, Tochukwu, MD, 3 mg at 02/09/23 0910   multivitamin (PROSIGHT) tablet 1 tablet, 1 tablet, Oral, BID, Agbata, Tochukwu, MD, 1 tablet at 02/09/23 0909   nitroGLYCERIN (NITROSTAT) SL tablet 0.4 mg, 0.4 mg, Sublingual, PRN, Agbata, Tochukwu, MD   ondansetron (ZOFRAN) tablet 4 mg, 4 mg, Oral, Q6H PRN **OR** ondansetron (ZOFRAN) injection 4 mg, 4 mg, Intravenous, Q6H PRN, Agbata, Tochukwu, MD   phenol (CHLORASEPTIC) mouth spray 1 spray, 1 spray, Mouth/Throat, PRN, Agbata, Tochukwu, MD, 1 spray at 02/04/23 0610   QUEtiapine (SEROQUEL) tablet 25 mg, 25 mg, Oral, QHS, Adhikari, Amrit, MD  Patients Current Diet:  Diet Order             Diet regular Room service appropriate? No; Fluid consistency: Thin  Diet effective now                   Precautions / Restrictions Precautions Precautions: Fall Precaution Comments: legally blind Restrictions Weight Bearing Restrictions: No   Has the patient had 2 or more falls or a fall with injury in the past year? Yes  Prior Activity Level Limited Community (1-2x/wk): doesn't drive, family takes to appointments, no DME, mod I at baseline  Prior Functional Level Self Care: Did the patient need help bathing, dressing, using the toilet or eating? Independent  Indoor Mobility: Did the patient need assistance with walking from room to room (with or without device)? Independent  Stairs: Did the patient need assistance with internal or external stairs (with or without device)? Independent  Functional Cognition: Did the patient need help planning regular tasks such as shopping or remembering to take medications?  Independent  Patient Information Are you of Hispanic, Latino/a,or Spanish origin?: A. No, not of Hispanic, Latino/a, or Spanish origin What is your race?: A. White Do you need or want an interpreter to communicate with a doctor or health care staff?: 0. No  Patient's Response To:  Health Literacy and Transportation Is the patient able to respond to health literacy and transportation needs?: Yes Health Literacy - How often do you need to have someone help you when you read instructions, pamphlets, or other written material from your doctor or  pharmacy?: Never In the past 12 months, has lack of transportation kept you from medical appointments or from getting medications?: No In the past 12 months, has lack of transportation kept you from meetings, work, or from getting things needed for daily living?: No  Development worker, international aid / [place] Devices/Equipment: Grab bars in shower, Environmental consultant (specify type) (3 wheel) Home Equipment: Shower seat - built in (3 wheeled RW)  Prior Device Use: Indicate devices/aids used by the patient prior to current illness, exacerbation or injury? Walker  Current Functional Level Cognition  Overall Cognitive Status: Within Functional Limits for tasks assessed Orientation Level: Oriented to person, Disoriented to place, Disoriented to time, Disoriented to situation General Comments: Follows commands throughout session, answers questions appropriately.    Extremity Assessment (includes Sensation/Coordination)  Upper Extremity Assessment: Overall WFL for tasks assessed LUE Deficits / Details: limited elevation( H/O RTSA)  Lower Extremity Assessment: Defer to PT evaluation    ADLs  Overall ADL's : Needs assistance/impaired Eating/Feeding: Independent Grooming: Set up, Sitting Upper Body Bathing: Set up, Sitting Lower Body Bathing: Minimal assistance, Sit to/from stand Upper Body Dressing : Set up, Sitting Lower Body Dressing: Minimal  assistance, Sit to/from stand Lower Body Dressing Details (indicate cue type and reason): min assist to don shoes Toilet Transfer: Minimal assistance, Regular Toilet, Rolling walker (2 wheels) Toilet Transfer Details (indicate cue type and reason): min assist to power up from toilet Toileting- Clothing Manipulation and Hygiene: Moderate assistance, Sit to/from stand Toileting - Clothing Manipulation Details (indicate cue type and reason): mod assist for toileting Functional mobility during ADLs: Minimal assistance, Rolling walker (2 wheels)    Mobility  Overal bed mobility: Needs Assistance Bed Mobility: Supine to Sit Supine to sit: Supervision, HOB elevated General bed mobility comments: HOB almost fully elevated, use of bed rails PRN    Transfers  Overall transfer level: Needs assistance Equipment used: Rolling walker (2 wheels) Transfers: Sit to/from Stand Sit to Stand: Supervision General transfer comment: STS from EOB & recliner with RW, pushes to stand with BUE    Ambulation / Gait / Stairs / Wheelchair Mobility  Ambulation/Gait Ambulation/Gait assistance: Min guard, Supervision Gait Distance (Feet): 40 Feet (+ 40 ft) Assistive device: Rolling walker (2 wheels) Gait Pattern/deviations: Decreased stride length, Decreased step length - left, Decreased step length - right General Gait Details: Pt ambulates 2 laps in room x 2 times. Gait velocity: decreased    Posture / Balance Balance Overall balance assessment: Needs assistance Sitting-balance support: No upper extremity supported, Feet supported Sitting balance-Leahy Scale: Fair Standing balance support: During functional activity, Bilateral upper extremity supported, Reliant on assistive device for balance Standing balance-Leahy Scale: Fair Standing balance comment: posterior bias, improved with  further ambulation    Special needs/care consideration Behavioral consideration delirium   Previous Home Environment (from  acute therapy documentation) Living Arrangements: Spouse/significant other, Children Available Help at Discharge: Available PRN/intermittently Home Layout: Multi-level, Able to live on main level with bedroom/bathroom Alternate Level Stairs-Rails: Left Home Access: Stairs to enter Entrance Stairs-Number of Steps: 1 Bathroom Shower/Tub: Multimedia programmer: Handicapped height Home Care Services: No Additional Comments: wife on a rollator, unable to assist patient, daughter lives close by  Discharge Living Setting Plans for Discharge Living Setting: Patient's home, Lives with (comment) (spouse) Type of Home at Discharge: House Discharge Home Layout: Able to live on main level with bedroom/bathroom Discharge Home Access: Stairs to enter Entrance Stairs-Rails: None Entrance Stairs-Number of Steps: 1 Discharge Bathroom Shower/Tub: Walk-in shower  Discharge Bathroom Toilet: Handicapped height Discharge Bathroom Accessibility: Yes How Accessible: Accessible via walker Does the patient have any problems obtaining your medications?: No  Social/Family/Support Systems Patient Roles: Spouse Contact Information: Daquawn Hagedorn [id number] Anticipated Caregiver: contact Neal Dy Anticipated Caregiver's Contact Information: [phone] Ability/Limitations of Caregiver: n/a Caregiver Availability: 24/7 (supervision from spouse with option to hire caregivers if needed) Discharge Plan Discussed with Primary Caregiver: Yes Is Caregiver In Agreement with Plan?: Yes Does Caregiver/Family have Issues with Lodging/Transportation while Pt is in Rehab?: No  Goals Patient/Family Goal for Rehab: PT/OT supervision to mod I, SLP n/a Expected length of stay: 10-12 days Additional Information: covid + (isolation to end 3/11) Pt/Family Agrees to Admission and willing to participate: Yes Program Orientation Provided & Reviewed with Pt/Caregiver Including Roles  & Responsibilities:  Yes Additional Information Needs: discharge plan: home to pt's home where he lives with his spouse who can provide supervision.  Discussed with Pamala Hurry and family can bring in caregivers if needed for extra support  Barriers to Discharge: Decreased caregiver support  Decrease burden of Care through IP rehab admission: n/a  Possible need for SNF placement upon discharge: Not anticipated.  Plan to return to pt's home with his spouse providing supervision.  Daughter and son can arrange for in home caregivers if needed and aware that insurance does not cover this.   Patient Condition: I have reviewed medical records from [facility], spoken with CM, and daughter. I discussed via phone for inpatient rehabilitation assessment.  Patient will benefit from ongoing PT and OT, can actively participate in 3 hours of therapy a day 5 days of the week, and can make measurable gains during the admission.  Patient will also benefit from the coordinated team approach during an Inpatient Acute Rehabilitation admission.  The patient will receive intensive therapy as well as Rehabilitation physician, nursing, social worker, and care management interventions.  Due to safety, disease management, medication administration, pain management, and patient education the patient requires 24 hour a day rehabilitation nursing.  The patient is currently min assist with mobility and basic ADLs.  Discharge setting and therapy post discharge at home with home health is anticipated.  Patient has agreed to participate in the Acute Inpatient Rehabilitation Program and will admit today.  Preadmission Screen Completed By:  Michel Santee, PT, DPT  02/09/2023 4:59 PM ______________________________________________________________________   Discussed status with Dr. Naaman Plummer on 02/10/23  at 10:14 AM and received approval for admission today.  Admission Coordinator:  Michel Santee, PT, DPT time 10:14 AM Sudie Grumbling 02/10/23      Assessment/Plan: Diagnosis: debility after COVID infection Does the need for close, 24 hr/day Medical supervision in concert with the patient's rehab needs make it unreasonable for this patient to be served in a less intensive setting? Yes Co-Morbidities requiring supervision/potential complications: renal cell CA, CAD, CKD, HTN Due to bladder management, bowel management, safety, skin/wound care, disease management, medication administration, pain management, and patient education, does the patient require 24 hr/day rehab nursing? Yes Does the patient require coordinated care of a physician, rehab nurse, PT, OT to address physical and functional deficits in the context of the above medical diagnosis(es)? Yes Addressing deficits in the following areas: balance, endurance, locomotion, strength, transferring, bowel/bladder control, bathing, dressing, feeding, grooming, toileting, and psychosocial support Can the patient actively participate in an intensive therapy program of at least 3 hrs of therapy 5 days a week? Yes The potential for patient to make measurable gains while on inpatient rehab is excellent  Anticipated functional outcomes upon discharge from inpatient rehab: modified independent and supervision PT, modified independent and supervision OT, n/a SLP Estimated rehab length of stay to reach the above functional goals is: 10-12 days Anticipated discharge destination: Home 10. Overall Rehab/Functional Prognosis: excellent   MD Signature: Meredith Staggers, MD, [place] Director Rehabilitation Services 02/10/2023

## 2023-02-09 NOTE — Progress Notes (Signed)
Pt extremely combative and attempting to punch RN. RN assisted pt back to bed and advised that he cannot harm staff as they are here to help him. Haldol administered. Agricultural consultant notified.

## 2023-02-09 NOTE — Progress Notes (Signed)
PROGRESS NOTE  Donald Todd  [id number] DOB: 08-21-1929 DOA: 02/03/2023 PCP: [name], MD   Brief Narrative: Patient is a 89 year old male with history of prostate cancer status post seeding, metastatic renal cancer, hypertension, stage III CKD, coronary artery disease status post RCA stenting who presented with 2-day history of sore throat, cough, weakness.  COVID screen test was positive on presentation.  Admitted for symptomatic COVID infection.  Started on steroids, Paxlovid.  PT/OT recommending acute inpatient rehab versus skilled nursing facility.  TOC following.  Hospital course remarkable for development of delirium,likely hospital acquired Family making decision if they can take him home .  Assessment & Plan:  Principal Problem:   COVID-19 virus infection Active Problems:   CAD S/P percutaneous coronary angioplasty   Essential hypertension   History of nephrectomy- Lt   Chronic renal impairment, stage 3 (moderate) (HCC)   Malignant neoplasm metastatic to right lung (HCC)   Metastatic renal cell carcinoma to intra-abdominal site (HCC)   Bradycardia with 51-60 beats per minute   Thrombocytopenia ([place])   COVID-19  COVID infection: Presented with sore throat, cough, weakness.  Chest x-ray on presentation did not show any pneumonia.  Started on Paxlovid, steroids.  Currently on tapering Decadron.  Inflammatory markers improving.  Currently on room air.  Continue supportive care with cough medications, incentive spirometer.  Denies shortness of breath or cough.  Hospital acquired delirium: Patient got confused since yesterday afternoon.  Try to minimize sedatives.  Started on low-dose Seroquel  Diarrhea: Likely secondary to viral illness.  resolved now  Bradycardia: Resolved.  Beta-blocker discontinued  Leukopenia: Most likely from [place].  Resolved  Chronic dizziness/weakness: Also has macular degeneration.  Continue meclizine as needed for dizziness.  Complains  of dizziness on ambulation.  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  Follows with [facility].  History of hypertension: Currently on amlodipine 10 mg daily.  Lisinopril discontinued  History of renal artery stenosis, CKD stage IIIa: Currently kidney function at baseline.  Baseline creatinine around 1.3-1.5.  History of coronary artery disease: Aspirin, Lipitor.  Status post right PCI  Debility/deconditioning: Patient seen by PT/OT, recommended acute inpatient rehab versus SNF. CODE STATUS DNR.  Family thinking about taking him home.        DVT prophylaxis:Place and maintain sequential compression device Start: 02/03/23 1614     Code Status: DNR  Family Communication: Called and discussed with daughter Pamala Hurry  Patient status:Inpatient  Patient is from :Home  Anticipated discharge to:AIR vs SNF versus home  Estimated DC date: 1 to 2 days   Consultants: None  Procedures:None  Antimicrobials:  Anti-infectives (From admission, onward)    Start     Dose/Rate Route Frequency Ordered Stop   02/03/23 2200  nirmatrelvir/ritonavir (PAXLOVID) 3 tablet  Status:  Discontinued        3 tablet Oral 2 times daily 02/03/23 1546 02/03/23 1553   02/03/23 2200  nirmatrelvir/ritonavir (renal dosing) (PAXLOVID) 2 tablet        2 tablet Oral 2 times daily 02/03/23 1553 02/08/23 0916       Subjective:  Patient seen and examined at bedside today.  He was to be given Haldol because of extreme agitation last night.  During my evaluation, he was lying in bed, sleeping comfortably.  When called his name, he replied.  Denies any cough or shortness of breath.  On room air.  Objective: Vitals:  02/08/23 0705 02/08/23 1222 02/08/23 2049 02/09/23 0502  BP: (!) 149/74 122/74 (!) 120/53 105/74  Pulse: 98 89 98 90  Resp: '16 20 16 20  '$ Temp: 98.3 F (36.8 C) (!) 97.5 F (36.4  C) 97.8 F (36.6 C) 97.7 F (36.5 C)  TempSrc: Oral Oral Oral Oral  SpO2: 99% 97% 96% 98%  Weight:      Height:        Intake/Output Summary (Last 24 hours) at 02/09/2023 1042 Last data filed at 02/08/2023 1220 Gross per 24 hour  Intake --  Output 350 ml  Net -350 ml   Filed Weights   02/03/23 0930 02/03/23 1027  Weight: 63 kg 62.6 kg    Examination:   General exam: Overall comfortable, not in distress, sleepy/drowsy Respiratory system:  no wheezes or crackles  Cardiovascular system: S1 & S2 heard, RRR.  Gastrointestinal system: Abdomen is nondistended, soft and nontender. Central nervous system: Sleepy, drowsy, not oriented, obeys commands Extremities: No edema, no clubbing ,no cyanosis Skin: No rashes, no ulcers,no icterus     Data Reviewed: I have personally reviewed following labs and imaging studies  CBC: Recent Labs  Lab 02/03/23 1002 02/04/23 0559 02/05/23 0901 02/06/23 0509 02/07/23 0544 02/08/23 0541  WBC 5.2 4.0 3.0* 1.9* 9.5 9.6  NEUTROABS 4.0  --  1.9 1.5* 8.6* 8.7*  HGB 11.2* 10.9* 12.5* 13.2 13.9 12.2*  HCT 34.5* 34.9* 37.9* 39.6 40.6 36.9*  MCV 93.5 96.4 92.9 92.1 90.4 90.9  PLT 91* 84* 100* 100* 154 [id number]   Basic Metabolic Panel: Recent Labs  Lab 02/05/23 0901 02/06/23 0509 02/07/23 0544 02/08/23 0541 02/09/23 0544  NA 137 135 135 135 135  K 4.2 3.9 3.7 4.4 4.4  CL 107 101 101 103 102  CO2 '24 25 22 24 26  '$ GLUCOSE 101* 255* 169* 131* 156*  BUN 15 20 26* 37* 45*  CREATININE 1.02 1.15 1.32* 1.47* 1.55*  CALCIUM 8.2* 8.4* 8.8* 8.4* 8.4*     Recent Results (from the past 240 hour(s))  Resp panel by RT-PCR (RSV, Flu A&B, Covid) Anterior Nasal Swab     Status: Abnormal   Collection Time: 02/03/23  9:33 AM   Specimen: Anterior Nasal Swab  Result Value Ref Range Status   SARS Coronavirus 2 by RT PCR POSITIVE (A) NEGATIVE Final    Comment: (NOTE) SARS-CoV-2 target nucleic acids are DETECTED.  The SARS-CoV-2 RNA is generally detectable in  upper respiratory specimens during the acute phase of infection. Positive results are indicative of the presence of the identified virus, but do not rule out bacterial infection or co-infection with other pathogens not detected by the test. Clinical correlation with patient history and other diagnostic information is necessary to determine patient infection status. The expected result is Negative.  Fact Sheet for Patients: EntrepreneurPulse.com.au  Fact Sheet for Healthcare Providers: IncredibleEmployment.be  This test is not yet approved or cleared by the [place] FDA and  has been authorized for detection and/or diagnosis of SARS-CoV-2 by FDA under an Emergency Use Authorization (EUA).  This EUA will remain in effect (meaning this test can be used) for the duration of  the COVID-19 declaration under Section 564(b)(1) of the A ct, 21 U.S.C. section 360bbb-3(b)(1), unless the authorization is terminated or revoked sooner.     Influenza A by PCR NEGATIVE NEGATIVE Final   Influenza B by PCR NEGATIVE NEGATIVE Final    Comment: (NOTE) The Xpert Xpress SARS-CoV-2/FLU/RSV plus assay is intended as an aid in  the diagnosis of influenza from Nasopharyngeal swab specimens and should not be used as a sole basis for treatment. Nasal washings and aspirates are unacceptable for Xpert Xpress SARS-CoV-2/FLU/RSV testing.  Fact Sheet for Patients: EntrepreneurPulse.com.au  Fact Sheet for Healthcare Providers: IncredibleEmployment.be  This test is not yet approved or cleared by the [place] FDA and has been authorized for detection and/or diagnosis of SARS-CoV-2 by FDA under an Emergency Use Authorization (EUA). This EUA will remain in effect (meaning this test can be used) for the duration of the COVID-19 declaration under Section 564(b)(1) of the Act, 21 U.S.C. section 360bbb-3(b)(1), unless the authorization is  terminated or revoked.     Resp Syncytial Virus by PCR NEGATIVE NEGATIVE Final    Comment: (NOTE) Fact Sheet for Patients: EntrepreneurPulse.com.au  Fact Sheet for Healthcare Providers: IncredibleEmployment.be  This test is not yet approved or cleared by the [place] FDA and has been authorized for detection and/or diagnosis of SARS-CoV-2 by FDA under an Emergency Use Authorization (EUA). This EUA will remain in effect (meaning this test can be used) for the duration of the COVID-19 declaration under Section 564(b)(1) of the Act, 21 U.S.C. section 360bbb-3(b)(1), unless the authorization is terminated or revoked.  Performed at [facility], [address] [place], [place] [address]      Radiology Studies: No results found.  Scheduled Meds:  amLODipine  10 mg Oral Daily   aspirin EC  81 mg Oral QHS   atorvastatin  40 mg Oral QHS   dexamethasone  2 mg Oral Daily   loratadine  10 mg Oral Daily   menthol-cetylpyridinium  1 lozenge Oral TID   multivitamin  1 tablet Oral BID   Continuous Infusions:   LOS: 5 days   Shelly Coss, MD [facility] P3/06/2023, 10:42 AM

## 2023-02-09 NOTE — Progress Notes (Addendum)
Inpatient Rehab Admissions Coordinator:   Tried to contact spouse, immediately goes to voicemail.  I was able to speak to pt's son, Annie Main, and explain CIR goals/expectations.  He asks that I contact pt's daughter, Pamala Hurry.  I left a message with her to discuss.   1313: Spoke to pt's daughter, Pamala Hurry, to discuss rehab.  We reviewed 3 hrs/day (though may begin at a lower intensity 2/2 isolation precautions), ELOS 2 weeks, and planned discharge home at supervision level.  She states pt lives with spouse, and family has discussed with them hiring some more assistance at home.  Wife is capable of providing supervision per daughter's assessment.  She will discuss with family and get back to me on their decision.    Shann Medal, PT, DPT Admissions Coordinator [phone] 02/09/23  12:27 PM

## 2023-02-09 NOTE — TOC Progression Note (Signed)
Transition of Care [facility]) - Progression Note    Patient Details  Name: EOGHAN SALMO MRN: [id number] Date of Birth: [birth year]  Transition of [facility]) CM/SW [place], LCSW Phone Number: 02/09/2023, 3:52 PM  Clinical Narrative:    Spoke with pt's daughter to discuss recommendation for home health vs CIR. Pt's daughter shares that her mother is currently at home sick with COVID and kidney infection and will be unable to provide appropriate care for pt when he returns. [facility] and [facility] declined pt for SNF. Pt's daughter prefers pt to attend CIR prior to returning home. CIR notified.    Expected Discharge Plan: Skilled Nursing Facility Barriers to Discharge: SNF Covid  Expected Discharge Plan and Services In-house Referral: Clinical Social Work Discharge Planning Services: NA Post Acute Care Choice: [place] Living arrangements for the past 2 months: Single Family Home                 DME Arranged: N/A DME Agency: NA                   Social Determinants of Health (SDOH) Interventions SDOH Screenings   Food Insecurity: No Food Insecurity (02/03/2023)  Housing: Low Risk  (02/03/2023)  Transportation Needs: No Transportation Needs (02/03/2023)  Utilities: Not At Risk (02/03/2023)  Tobacco Use: Medium Risk (02/03/2023)    Readmission Risk Interventions     No data to display

## 2023-02-09 NOTE — Progress Notes (Signed)
Physical Therapy Treatment Patient Details Name: Donald Todd MRN: [id number] DOB: [birth year] Today's Date: 02/09/2023   History of Present Illness Donald Todd is a 89 y.o. 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 Pt is able to complete bed mobility with hospital bed features but without physical assistance. Pt transfers STS with supervision & using BUE to power up, ambulates multiple laps in room with seated break in between. Pt is able to ambulate with RW & CGA<>supervision with only 1 LOB with light min assist to correct. Pt reports he feels he's walking differently compared to prior to admission, in that he drags his feet a little more. If pt has assistance at home, pt can d/c home with HHPT. Updated Education officer, museum.  Of note, pt c/o dizziness throughout session but no overt nystagmus noted. Pt reports these dizzy spells are pre-existing & he's already made his PCP aware.    Recommendations for follow up therapy are one component of a multi-disciplinary discharge planning process, led by the attending physician.  Recommendations may be updated based on patient status, additional functional criteria and insurance authorization.  Follow Up Recommendations  Home health PT Can patient physically be transported by private vehicle: Yes   Assistance Recommended at Discharge Frequent or constant Supervision/Assistance  Patient can return home with the following A little help with walking and/or transfers;A little help with  bathing/dressing/bathroom;Assistance with cooking/housework;Assist for transportation;Help with stairs or ramp for entrance   Equipment Recommendations  None recommended by PT    Recommendations for Other Services       Precautions / Restrictions Precautions Precautions: Fall Precaution Comments: legally blind Restrictions Weight Bearing Restrictions: No     Mobility  Bed Mobility Overal bed mobility: Needs Assistance Bed Mobility: Supine to Sit     Supine to sit: Supervision, HOB elevated     General bed mobility comments: HOB almost fully elevated, use of bed rails PRN    Transfers Overall transfer level: Needs assistance Equipment used: Rolling walker (2 wheels) Transfers: Sit to/from Stand Sit to Stand: Supervision           General transfer comment: STS from EOB & recliner with RW, pushes to stand with BUE    Ambulation/Gait Ambulation/Gait assistance: Min guard, Supervision Gait Distance (Feet): 40 Feet (+ 40 ft) Assistive device: Rolling walker (2 wheels) Gait Pattern/deviations: Decreased stride length, Decreased step length - left, Decreased step length - right Gait velocity: decreased     General Gait Details: Pt ambulates 2 laps in room x 2 times.   Stairs             Wheelchair Mobility    Modified Rankin (Stroke Patients Only)       Balance Overall balance assessment: Needs assistance Sitting-balance support: No upper extremity supported, Feet supported Sitting balance-Leahy Scale: Fair     Standing balance support: During functional activity, Bilateral upper extremity supported, Reliant on assistive device for balance Standing balance-Leahy Scale: Fair  Cognition Arousal/Alertness: Awake/alert Behavior During Therapy: WFL for tasks assessed/performed Overall Cognitive Status: Within Functional Limits for tasks assessed                                 General Comments:  Follows commands throughout session, answers questions appropriately.        Exercises      General Comments General comments (skin integrity, edema, etc.): Max HR 103 bpm, BP sitting in recliner in LUE 124/60 mmHg MAP 79      Pertinent Vitals/Pain Pain Assessment Pain Assessment: No/denies pain    Home Living                          Prior Function            PT Goals (current goals can now be found in the care plan section) Acute Rehab PT Goals Patient Stated Goal: to go home PT Goal Formulation: With patient Time For Goal Achievement: 02/21/23 Potential to Achieve Goals: Good Progress towards PT goals: Progressing toward goals    Frequency    Min 3X/week      PT Plan Discharge plan needs to be updated    Co-evaluation              AM-PAC PT "6 Clicks" Mobility   Outcome Measure  Help needed turning from your back to your side while in a flat bed without using bedrails?: A Little Help needed moving from lying on your back to sitting on the side of a flat bed without using bedrails?: A Little Help needed moving to and from a bed to a chair (including a wheelchair)?: A Little Help needed standing up from a chair using your arms (e.g., wheelchair or bedside chair)?: A Little Help needed to walk in hospital room?: A Little Help needed climbing 3-5 steps with a railing? : A Little 6 Click Score: 18    End of Session   Activity Tolerance: Patient tolerated treatment well;Patient limited by fatigue Patient left: in chair;with chair alarm set;with call bell/phone within reach   PT Visit Diagnosis: Unsteadiness on feet (R26.81);Difficulty in walking, not elsewhere classified (R26.2);Dizziness and giddiness (R42);Muscle weakness (generalized) (M62.81);History of falling (Z91.81)     Time: [id number] PT Time Calculation (min) (ACUTE ONLY): 18 min  Charges:  $Therapeutic Activity: 8-22 mins                     Lavone Nian, PT,  DPT 02/09/23, 3:50 PM   Waunita Schooner 02/09/2023, 3:49 PM

## 2023-02-10 ENCOUNTER — Inpatient Hospital Stay (HOSPITAL_COMMUNITY)
Admission: RE | Admit: 2023-02-10 | Discharge: 2023-02-21 | DRG: 945 | Disposition: A | Discharge status: Home Health | Payer: Medicare Other | Source: Intra-hospital | Attending: Physical Medicine and Rehabilitation | Admitting: Physical Medicine and Rehabilitation

## 2023-02-10 ENCOUNTER — Encounter (HOSPITAL_COMMUNITY): Payer: Self-pay | Admitting: Physical Medicine and Rehabilitation

## 2023-02-10 ENCOUNTER — Other Ambulatory Visit: Payer: Self-pay

## 2023-02-10 DIAGNOSIS — M19031 Primary osteoarthritis, right wrist: Secondary | ICD-10-CM | POA: Diagnosis present

## 2023-02-10 DIAGNOSIS — H3530 Unspecified macular degeneration: Secondary | ICD-10-CM | POA: Diagnosis present

## 2023-02-10 DIAGNOSIS — R5381 Other malaise: Secondary | ICD-10-CM | POA: Diagnosis present

## 2023-02-10 DIAGNOSIS — R338 Other retention of urine: Secondary | ICD-10-CM | POA: Diagnosis not present

## 2023-02-10 DIAGNOSIS — Z87891 Personal history of nicotine dependence: Secondary | ICD-10-CM | POA: Diagnosis not present

## 2023-02-10 DIAGNOSIS — Z905 Acquired absence of kidney: Secondary | ICD-10-CM

## 2023-02-10 DIAGNOSIS — R059 Cough, unspecified: Secondary | ICD-10-CM | POA: Diagnosis not present

## 2023-02-10 DIAGNOSIS — J9811 Atelectasis: Secondary | ICD-10-CM | POA: Diagnosis present

## 2023-02-10 DIAGNOSIS — Z66 Do not resuscitate: Secondary | ICD-10-CM | POA: Diagnosis present

## 2023-02-10 DIAGNOSIS — M25531 Pain in right wrist: Secondary | ICD-10-CM | POA: Diagnosis not present

## 2023-02-10 DIAGNOSIS — E871 Hypo-osmolality and hyponatremia: Secondary | ICD-10-CM | POA: Diagnosis present

## 2023-02-10 DIAGNOSIS — H548 Legal blindness, as defined in USA: Secondary | ICD-10-CM | POA: Diagnosis present

## 2023-02-10 DIAGNOSIS — I252 Old myocardial infarction: Secondary | ICD-10-CM | POA: Diagnosis not present

## 2023-02-10 DIAGNOSIS — Z85528 Personal history of other malignant neoplasm of kidney: Secondary | ICD-10-CM

## 2023-02-10 DIAGNOSIS — Z681 Body mass index (BMI) 19 or less, adult: Secondary | ICD-10-CM | POA: Diagnosis not present

## 2023-02-10 DIAGNOSIS — I2510 Atherosclerotic heart disease of native coronary artery without angina pectoris: Secondary | ICD-10-CM | POA: Diagnosis present

## 2023-02-10 DIAGNOSIS — Z9109 Other allergy status, other than to drugs and biological substances: Secondary | ICD-10-CM

## 2023-02-10 DIAGNOSIS — Z8616 Personal history of COVID-19: Secondary | ICD-10-CM

## 2023-02-10 DIAGNOSIS — Z955 Presence of coronary angioplasty implant and graft: Secondary | ICD-10-CM | POA: Diagnosis not present

## 2023-02-10 DIAGNOSIS — Z8546 Personal history of malignant neoplasm of prostate: Secondary | ICD-10-CM

## 2023-02-10 DIAGNOSIS — Z87442 Personal history of urinary calculi: Secondary | ICD-10-CM

## 2023-02-10 DIAGNOSIS — K5900 Constipation, unspecified: Secondary | ICD-10-CM | POA: Insufficient documentation

## 2023-02-10 DIAGNOSIS — I129 Hypertensive chronic kidney disease with stage 1 through stage 4 chronic kidney disease, or unspecified chronic kidney disease: Secondary | ICD-10-CM | POA: Diagnosis present

## 2023-02-10 DIAGNOSIS — N390 Urinary tract infection, site not specified: Secondary | ICD-10-CM | POA: Insufficient documentation

## 2023-02-10 DIAGNOSIS — M13831 Other specified arthritis, right wrist: Secondary | ICD-10-CM

## 2023-02-10 DIAGNOSIS — R2681 Unsteadiness on feet: Secondary | ICD-10-CM | POA: Diagnosis present

## 2023-02-10 DIAGNOSIS — N183 Chronic kidney disease, stage 3 unspecified: Secondary | ICD-10-CM | POA: Diagnosis present

## 2023-02-10 DIAGNOSIS — D649 Anemia, unspecified: Secondary | ICD-10-CM | POA: Diagnosis present

## 2023-02-10 DIAGNOSIS — N1831 Chronic kidney disease, stage 3a: Secondary | ICD-10-CM | POA: Diagnosis present

## 2023-02-10 DIAGNOSIS — E785 Hyperlipidemia, unspecified: Secondary | ICD-10-CM | POA: Diagnosis present

## 2023-02-10 DIAGNOSIS — Z7982 Long term (current) use of aspirin: Secondary | ICD-10-CM

## 2023-02-10 DIAGNOSIS — R42 Dizziness and giddiness: Secondary | ICD-10-CM | POA: Diagnosis present

## 2023-02-10 DIAGNOSIS — U071 COVID-19: Secondary | ICD-10-CM

## 2023-02-10 DIAGNOSIS — K5909 Other constipation: Secondary | ICD-10-CM | POA: Diagnosis present

## 2023-02-10 DIAGNOSIS — Z885 Allergy status to narcotic agent status: Secondary | ICD-10-CM

## 2023-02-10 DIAGNOSIS — D696 Thrombocytopenia, unspecified: Secondary | ICD-10-CM | POA: Diagnosis present

## 2023-02-10 DIAGNOSIS — Z79899 Other long term (current) drug therapy: Secondary | ICD-10-CM

## 2023-02-10 DIAGNOSIS — N189 Chronic kidney disease, unspecified: Secondary | ICD-10-CM

## 2023-02-10 DIAGNOSIS — I10 Essential (primary) hypertension: Secondary | ICD-10-CM | POA: Diagnosis not present

## 2023-02-10 DIAGNOSIS — R339 Retention of urine, unspecified: Secondary | ICD-10-CM | POA: Diagnosis not present

## 2023-02-10 DIAGNOSIS — Z96612 Presence of left artificial shoulder joint: Secondary | ICD-10-CM | POA: Diagnosis present

## 2023-02-10 DIAGNOSIS — Z8701 Personal history of pneumonia (recurrent): Secondary | ICD-10-CM | POA: Diagnosis not present

## 2023-02-10 DIAGNOSIS — C7989 Secondary malignant neoplasm of other specified sites: Secondary | ICD-10-CM | POA: Diagnosis present

## 2023-02-10 DIAGNOSIS — E440 Moderate protein-calorie malnutrition: Secondary | ICD-10-CM | POA: Diagnosis present

## 2023-02-10 DIAGNOSIS — R053 Chronic cough: Secondary | ICD-10-CM | POA: Insufficient documentation

## 2023-02-10 DIAGNOSIS — M1990 Unspecified osteoarthritis, unspecified site: Secondary | ICD-10-CM | POA: Diagnosis present

## 2023-02-10 DIAGNOSIS — Z91041 Radiographic dye allergy status: Secondary | ICD-10-CM

## 2023-02-10 DIAGNOSIS — Z923 Personal history of irradiation: Secondary | ICD-10-CM

## 2023-02-10 DIAGNOSIS — K5901 Slow transit constipation: Secondary | ICD-10-CM | POA: Diagnosis not present

## 2023-02-10 DIAGNOSIS — N179 Acute kidney failure, unspecified: Secondary | ICD-10-CM | POA: Diagnosis not present

## 2023-02-10 DIAGNOSIS — Z9049 Acquired absence of other specified parts of digestive tract: Secondary | ICD-10-CM

## 2023-02-10 LAB — BASIC METABOLIC PANEL
Anion gap: 5 (ref 5–15)
BUN: 43 mg/dL — ABNORMAL HIGH (ref 8–23)
CO2: 25 mmol/L (ref 22–32)
Calcium: 8.2 mg/dL — ABNORMAL LOW (ref 8.9–10.3)
Chloride: 102 mmol/L (ref 98–111)
Creatinine, Ser: 1.4 mg/dL — ABNORMAL HIGH (ref 0.61–1.24)
GFR, Estimated: 47 mL/min — ABNORMAL LOW (ref >60)
Glucose, Bld: 125 mg/dL — ABNORMAL HIGH (ref 70–99)
Potassium: 4.6 mmol/L (ref 3.5–5.1)
Sodium: 132 mmol/L — ABNORMAL LOW (ref 135–145)

## 2023-02-10 MED ORDER — ENOXAPARIN SODIUM 40 MG/0.4ML IJ SOSY: 40.0000 mg | PREFILLED_SYRINGE | INTRAMUSCULAR | Status: DC

## 2023-02-10 MED ORDER — MENTHOL 3 MG MT LOZG
1.0000 | LOZENGE | Freq: Three times a day (TID) | OROMUCOSAL | Status: DC
  Administered 2023-02-10 – 2023-02-14 (×10): 3 mg via ORAL
  Filled 2023-02-10 (×2): qty 9

## 2023-02-10 MED ORDER — LORATADINE 10 MG PO TABS
10.0000 mg | ORAL_TABLET | Freq: Every day | ORAL | Status: DC
  Administered 2023-02-11 – 2023-02-21 (×11): 10 mg via ORAL
  Filled 2023-02-10 (×11): qty 1

## 2023-02-10 MED ORDER — QUETIAPINE FUMARATE 25 MG PO TABS: 25.0000 mg | ORAL_TABLET | Freq: Every day | ORAL | Status: DC

## 2023-02-10 MED ORDER — GUAIFENESIN-DM 100-10 MG/5ML PO SYRP: 5.0000 mL | ORAL_SOLUTION | ORAL | 0 refills | Status: DC | PRN

## 2023-02-10 MED ORDER — ENOXAPARIN SODIUM 30 MG/0.3ML IJ SOSY
30.0000 mg | PREFILLED_SYRINGE | INTRAMUSCULAR | Status: DC
  Administered 2023-02-10 – 2023-02-20 (×11): 30 mg via SUBCUTANEOUS
  Filled 2023-02-10 (×11): qty 0.3

## 2023-02-10 MED ORDER — ACETAMINOPHEN 325 MG PO TABS
325.0000 mg | ORAL_TABLET | ORAL | Status: DC | PRN
  Administered 2023-02-13 – 2023-02-18 (×5): 650 mg via ORAL
  Filled 2023-02-10 (×5): qty 2

## 2023-02-10 MED ORDER — QUETIAPINE FUMARATE 25 MG PO TABS
25.0000 mg | ORAL_TABLET | Freq: Every day | ORAL | Status: DC
  Administered 2023-02-10 – 2023-02-20 (×11): 25 mg via ORAL
  Filled 2023-02-10 (×11): qty 1

## 2023-02-10 MED ORDER — ATORVASTATIN CALCIUM 40 MG PO TABS
40.0000 mg | ORAL_TABLET | Freq: Every day | ORAL | Status: DC
  Administered 2023-02-10 – 2023-02-20 (×11): 40 mg via ORAL
  Filled 2023-02-10 (×11): qty 1

## 2023-02-10 MED ORDER — ENSURE ENLIVE PO LIQD
237.0000 mL | Freq: Two times a day (BID) | ORAL | Status: DC
  Administered 2023-02-10 – 2023-02-12 (×5): 237 mL via ORAL

## 2023-02-10 MED ORDER — BISACODYL 10 MG RE SUPP: 10.0000 mg | Freq: Every day | RECTAL | Status: DC | PRN

## 2023-02-10 MED ORDER — MECLIZINE HCL 25 MG PO TABS: 25.0000 mg | ORAL_TABLET | ORAL | Status: DC | PRN

## 2023-02-10 MED ORDER — DIPHENHYDRAMINE HCL 12.5 MG/5ML PO ELIX: 12.5000 mg | ORAL_SOLUTION | Freq: Four times a day (QID) | ORAL | Status: DC | PRN

## 2023-02-10 MED ORDER — PROCHLORPERAZINE 25 MG RE SUPP: 12.5000 mg | Freq: Four times a day (QID) | RECTAL | Status: DC | PRN

## 2023-02-10 MED ORDER — ASPIRIN 81 MG PO TBEC
81.0000 mg | DELAYED_RELEASE_TABLET | Freq: Every day | ORAL | Status: DC
  Administered 2023-02-10 – 2023-02-20 (×11): 81 mg via ORAL
  Filled 2023-02-10 (×11): qty 1

## 2023-02-10 MED ORDER — DEXAMETHASONE 4 MG PO TABS
2.0000 mg | ORAL_TABLET | Freq: Every day | ORAL | Status: AC
  Administered 2023-02-11: 2 mg via ORAL
  Filled 2023-02-10: qty 1

## 2023-02-10 MED ORDER — AMLODIPINE BESYLATE 10 MG PO TABS
10.0000 mg | ORAL_TABLET | Freq: Every day | ORAL | Status: DC
  Administered 2023-02-11 – 2023-02-21 (×11): 10 mg via ORAL
  Filled 2023-02-10 (×11): qty 1

## 2023-02-10 MED ORDER — PROCHLORPERAZINE MALEATE 5 MG PO TABS: 5.0000 mg | ORAL_TABLET | Freq: Four times a day (QID) | ORAL | Status: DC | PRN

## 2023-02-10 MED ORDER — FLEET ENEMA 7-19 GM/118ML RE ENEM: 1.0000 | ENEMA | Freq: Once | RECTAL | Status: DC | PRN

## 2023-02-10 MED ORDER — PROSIGHT PO TABS
1.0000 | ORAL_TABLET | Freq: Two times a day (BID) | ORAL | Status: DC
  Administered 2023-02-10 – 2023-02-21 (×22): 1 via ORAL
  Filled 2023-02-10 (×22): qty 1

## 2023-02-10 MED ORDER — PROCHLORPERAZINE EDISYLATE 10 MG/2ML IJ SOLN: 5.0000 mg | Freq: Four times a day (QID) | INTRAMUSCULAR | Status: DC | PRN

## 2023-02-10 MED ORDER — ALUM & MAG HYDROXIDE-SIMETH 200-200-20 MG/5ML PO SUSP: 30.0000 mL | ORAL | Status: DC | PRN

## 2023-02-10 MED ORDER — POLYETHYLENE GLYCOL 3350 17 G PO PACK: 17.0000 g | PACK | Freq: Every day | ORAL | Status: DC | PRN

## 2023-02-10 MED ORDER — NITROGLYCERIN 0.4 MG SL SUBL: 0.4000 mg | SUBLINGUAL_TABLET | SUBLINGUAL | Status: DC | PRN

## 2023-02-10 MED ORDER — GUAIFENESIN-DM 100-10 MG/5ML PO SYRP: 5.0000 mL | ORAL_SOLUTION | Freq: Four times a day (QID) | ORAL | Status: DC | PRN

## 2023-02-10 MED ORDER — GUAIFENESIN-DM 100-10 MG/5ML PO SYRP: 5.0000 mL | ORAL_SOLUTION | ORAL | Status: DC | PRN

## 2023-02-10 MED ORDER — IPRATROPIUM BROMIDE 0.06 % NA SOLN: 1.0000 | Freq: Two times a day (BID) | NASAL | Status: DC | PRN

## 2023-02-10 MED ORDER — DEXAMETHASONE 2 MG PO TABS: 2.0000 mg | ORAL_TABLET | Freq: Every day | ORAL | 0 refills | Status: DC

## 2023-02-10 MED ORDER — TRAZODONE HCL 50 MG PO TABS: 25.0000 mg | ORAL_TABLET | Freq: Every evening | ORAL | Status: DC | PRN

## 2023-02-10 MED ORDER — PHENOL 1.4 % MT LIQD: 1.0000 | OROMUCOSAL | Status: DC | PRN

## 2023-02-10 NOTE — Progress Notes (Addendum)
Inpatient Rehab Admissions Coordinator:   I have a bed for this patient to admit to CIR today.  Dr. Tawanna Solo in agreement.  Will let pt/family know and I will arrange [facility] transport once I have a room assignment.   1015: Pt going to room 4w10.  Our RN will call for report.   Shann Medal, PT, DPT Admissions Coordinator [phone] 02/10/23  10:12 AM

## 2023-02-10 NOTE — Plan of Care (Signed)
  Problem: Safety: Goal: Non-violent Restraint(s) Outcome: Adequate for Discharge   

## 2023-02-10 NOTE — Progress Notes (Signed)
PMR Admission Coordinator Pre-Admission Assessment   Patient: Donald Todd is an 89 y.o., male MRN: [id number] DOB: [birth year] Height: '5\' 7"'$  (170.2 cm) Weight: 62.6 kg   Insurance Information HMO:     PPO:      PCP:      IPA:      80/20:      OTHER:  PRIMARY: medicare A/B      Policy#: [id number]      Subscriber: pt CM Name:       Phone#:      Fax#:  Pre-Cert#: verified Civil engineer, contracting:  Benefits:  Phone #:      Name:  Eff. Date: A/B 02/03/96     Deduct: $1632      Out of Pocket Max: n/a      Life Max:  CIR: 100%      SNF: 20 full days Outpatient: 80%     Co-Pay: 20% Home Health: 100%      Co-Pay:  DME: 80%     Co-Pay: 20% Providers:  SECONDARY: Tricare for Life      Policy#: [id number]     Phone#: [phone]   Financial Counselor:       Phone#:    The "Data Collection Information Summary" for patients in Inpatient Rehabilitation Facilities with attached "Privacy Act [place] Records" was provided and verbally reviewed with: Patient and Family   Emergency Contact Information Contact Information       Name Relation Home Work Mobile    [place] Spouse     [place] Son     [place] Daughter [phone]               Current Medical History  Patient Admitting Diagnosis: debility 2/2 covid infection   History of Present Illness: Pt is a 89 y/o male with PMH of renal cell carcinoma with mets to the psoas muscle s/p radiation therapy, stage IIIa CKD, L partial nephrectomy, HTN, macular degeneration, CAD, who presents to [facility] ED on 02/03/23 with 2 day history of sore throat, cough, weakness, and dizziness.  In ED, vitals and labs relatively unremarkable except platelets 91K, creatinine 1.32 and calcium 8.1.  Pt found to be covid + and was started on paxlovid and decadron taper.  Lovenox held in the setting of thrombocytopenia.  Pt with episode of bradycardia for which cardiology was consulted and they discontinued his  beta blocker.  Pt with chronic dizziness, and was started on meclizine PRN.  Hospital course complicated by delirium, pt started on low dose seroquel.  Therapy ongoing and recommendations are for CIR to return to baseline.    Patient's medical record from [facility] has been reviewed by the rehabilitation admission coordinator and physician.   Past Medical History      Past Medical History:  Diagnosis Date   Arthritis     CAD (coronary artery disease)      last cath. 07-09-2010   Cancer [facility])      kidney cancer , prostate cancer    Chronic kidney disease      lt kidnet removed   H/O cardiovascular stress test 08-04-2010    ef 55% NO ISCHEMIA   H/O unilateral nephrectomy      lt. nephrectomy   Heart attack ([place]) 1993   History of kidney stones     Hyperlipemia     Hypertension      renal dopplers 218.13-normal patency   Inguinal  hernia 08/01/11    right   Macular degeneration     Prostate cancer [facility])     Prostate disease      Cancer S/P seed implant   Renal mass     Vertigo        Has the patient had major surgery during 100 days prior to admission? No   Family History   family history includes Cancer in his mother; Skin cancer in his brother; Stroke in his brother and father.   Current Medications   Current Facility-Administered Medications:    acetaminophen (TYLENOL) tablet 1,000 mg, 1,000 mg, Oral, Q6H PRN, Agbata, Tochukwu, MD, 1,000 mg at 02/03/23 2236   amLODipine (NORVASC) tablet 10 mg, 10 mg, Oral, Daily, Verlon Au, Jai-Gurmukh, MD, 10 mg at 02/09/23 0909   aspirin EC tablet 81 mg, 81 mg, Oral, QHS, Agbata, Tochukwu, MD, 81 mg at 02/07/23 2144   atorvastatin (LIPITOR) tablet 40 mg, 40 mg, Oral, QHS, Agbata, Tochukwu, MD   dexamethasone (DECADRON) tablet 2 mg, 2 mg, Oral, Daily, Adhikari, Amrit, MD, 2 mg at 02/09/23 0909   guaiFENesin-dextromethorphan (ROBITUSSIN DM) 100-10 MG/5ML syrup 5 mL, 5 mL, Oral, Q4H PRN, Agbata, Tochukwu, MD, 5 mL at 02/06/23 0243    ipratropium (ATROVENT) 0.03 % nasal spray 1-2 spray, 1-2 spray, Each Nare, BID PRN, Agbata, Tochukwu, MD   loratadine (CLARITIN) tablet 10 mg, 10 mg, Oral, Daily, Agbata, Tochukwu, MD, 10 mg at 02/09/23 [id number]   meclizine (ANTIVERT) tablet 25 mg, 25 mg, Oral, PRN, Agbata, Tochukwu, MD, 25 mg at 02/08/23 [id number]   menthol-cetylpyridinium (CEPACOL) lozenge 3 mg, 1 lozenge, Oral, TID, Agbata, Tochukwu, MD, 3 mg at 02/09/23 0910   multivitamin (PROSIGHT) tablet 1 tablet, 1 tablet, Oral, BID, Agbata, Tochukwu, MD, 1 tablet at 02/09/23 0909   nitroGLYCERIN (NITROSTAT) SL tablet 0.4 mg, 0.4 mg, Sublingual, PRN, Agbata, Tochukwu, MD   ondansetron (ZOFRAN) tablet 4 mg, 4 mg, Oral, Q6H PRN **OR** ondansetron (ZOFRAN) injection 4 mg, 4 mg, Intravenous, Q6H PRN, Agbata, Tochukwu, MD   phenol (CHLORASEPTIC) mouth spray 1 spray, 1 spray, Mouth/Throat, PRN, Agbata, Tochukwu, MD, 1 spray at 02/04/23 0610   QUEtiapine (SEROQUEL) tablet 25 mg, 25 mg, Oral, QHS, Adhikari, Amrit, MD   Patients Current Diet:  Diet Order                  Diet regular Room service appropriate? No; Fluid consistency: Thin  Diet effective now                         Precautions / Restrictions Precautions Precautions: Fall Precaution Comments: legally blind Restrictions Weight Bearing Restrictions: No    Has the patient had 2 or more falls or a fall with injury in the past year? Yes   Prior Activity Level Limited Community (1-2x/wk): doesn't drive, family takes to appointments, no DME, mod I at baseline   Prior Functional Level Self Care: Did the patient need help bathing, dressing, using the toilet or eating? Independent   Indoor Mobility: Did the patient need assistance with walking from room to room (with or without device)? Independent   Stairs: Did the patient need assistance with internal or external stairs (with or without device)? Independent   Functional Cognition: Did the patient need help planning regular tasks  such as shopping or remembering to take medications? Independent   Patient Information Are you of Hispanic, Latino/a,or Spanish origin?: A. No, not of Hispanic, Latino/a, or Spanish origin What is  your race?: A. White Do you need or want an interpreter to communicate with a doctor or health care staff?: 0. No   Patient's Response To:  Health Literacy and Transportation Is the patient able to respond to health literacy and transportation needs?: Yes Health Literacy - How often do you need to have someone help you when you read instructions, pamphlets, or other written material from your doctor or pharmacy?: Never In the past 12 months, has lack of transportation kept you from medical appointments or from getting medications?: No In the past 12 months, has lack of transportation kept you from meetings, work, or from getting things needed for daily living?: No   Development worker, international aid / [place] Devices/Equipment: Grab bars in shower, Environmental consultant (specify type) (3 wheel) Home Equipment: Shower seat - built in (3 wheeled RW)   Prior Device Use: Indicate devices/aids used by the patient prior to current illness, exacerbation or injury? Walker   Current Functional Level Cognition   Overall Cognitive Status: Within Functional Limits for tasks assessed Orientation Level: Oriented to person, Disoriented to place, Disoriented to time, Disoriented to situation General Comments: Follows commands throughout session, answers questions appropriately.    Extremity Assessment (includes Sensation/Coordination)   Upper Extremity Assessment: Overall WFL for tasks assessed LUE Deficits / Details: limited elevation( H/O RTSA)  Lower Extremity Assessment: Defer to PT evaluation     ADLs   Overall ADL's : Needs assistance/impaired Eating/Feeding: Independent Grooming: Set up, Sitting Upper Body Bathing: Set up, Sitting Lower Body Bathing: Minimal assistance, Sit to/from stand Upper Body  Dressing : Set up, Sitting Lower Body Dressing: Minimal assistance, Sit to/from stand Lower Body Dressing Details (indicate cue type and reason): min assist to don shoes Toilet Transfer: Minimal assistance, Regular Toilet, Rolling walker (2 wheels) Toilet Transfer Details (indicate cue type and reason): min assist to power up from toilet Toileting- Clothing Manipulation and Hygiene: Moderate assistance, Sit to/from stand Toileting - Clothing Manipulation Details (indicate cue type and reason): mod assist for toileting Functional mobility during ADLs: Minimal assistance, Rolling walker (2 wheels)     Mobility   Overal bed mobility: Needs Assistance Bed Mobility: Supine to Sit Supine to sit: Supervision, HOB elevated General bed mobility comments: HOB almost fully elevated, use of bed rails PRN     Transfers   Overall transfer level: Needs assistance Equipment used: Rolling walker (2 wheels) Transfers: Sit to/from Stand Sit to Stand: Supervision General transfer comment: STS from EOB & recliner with RW, pushes to stand with BUE     Ambulation / Gait / Stairs / Wheelchair Mobility   Ambulation/Gait Ambulation/Gait assistance: Min guard, Supervision Gait Distance (Feet): 40 Feet (+ 40 ft) Assistive device: Rolling walker (2 wheels) Gait Pattern/deviations: Decreased stride length, Decreased step length - left, Decreased step length - right General Gait Details: Pt ambulates 2 laps in room x 2 times. Gait velocity: decreased     Posture / Balance Balance Overall balance assessment: Needs assistance Sitting-balance support: No upper extremity supported, Feet supported Sitting balance-Leahy Scale: Fair Standing balance support: During functional activity, Bilateral upper extremity supported, Reliant on assistive device for balance Standing balance-Leahy Scale: Fair Standing balance comment: posterior bias, improved with  further ambulation     Special needs/care consideration  Behavioral consideration delirium    Previous Home Environment (from acute therapy documentation) Living Arrangements: Spouse/significant other, Children Available Help at Discharge: Available PRN/intermittently Home Layout: Multi-level, Able to live on main level with bedroom/bathroom Alternate Level Stairs-Rails: Left  Home Access: Stairs to enter [employer] of Steps: 1 Bathroom Shower/Tub: Multimedia programmer: Handicapped height Home Care Services: No Additional Comments: wife on a rollator, unable to assist patient, daughter lives close by   Discharge Living Setting Plans for Discharge Living Setting: Patient's home, Lives with (comment) (spouse) Type of Home at Discharge: House Discharge Home Layout: Able to live on main level with bedroom/bathroom Discharge Home Access: Stairs to enter Entrance Stairs-Rails: None Entrance Stairs-Number of Steps: 1 Discharge Bathroom Shower/Tub: Walk-in shower Discharge Bathroom Toilet: Handicapped height Discharge Bathroom Accessibility: Yes How Accessible: Accessible via walker Does the patient have any problems obtaining your medications?: No   Social/Family/Support Systems Patient Roles: Spouse Contact Information: Danthony Stonebreaker [phone] Anticipated Caregiver: contact Neal Dy Anticipated Caregiver's Contact Information: [phone] Ability/Limitations of Caregiver: n/a Caregiver Availability: 24/7 (supervision from spouse with option to hire caregivers if needed) Discharge Plan Discussed with Primary Caregiver: Yes Is Caregiver In Agreement with Plan?: Yes Does Caregiver/Family have Issues with Lodging/Transportation while Pt is in Rehab?: No   Goals Patient/Family Goal for Rehab: PT/OT supervision to mod I, SLP n/a Expected length of stay: 10-12 days Additional Information: covid + (isolation to end 3/11) Pt/Family Agrees to Admission and willing to participate: Yes Program Orientation Provided &  Reviewed with Pt/Caregiver Including Roles  & Responsibilities: Yes Additional Information Needs: discharge plan: home to pt's home where he lives with his spouse who can provide supervision.  Discussed with Pamala Hurry and family can bring in caregivers if needed for extra support  Barriers to Discharge: Decreased caregiver support   Decrease burden of Care through IP rehab admission: n/a   Possible need for SNF placement upon discharge: Not anticipated.  Plan to return to pt's home with his spouse providing supervision.  Daughter and son can arrange for in home caregivers if needed and aware that insurance does not cover this.    Patient Condition: I have reviewed medical records from [facility], spoken with CM, and daughter. I discussed via phone for inpatient rehabilitation assessment.  Patient will benefit from ongoing PT and OT, can actively participate in 3 hours of therapy a day 5 days of the week, and can make measurable gains during the admission.  Patient will also benefit from the coordinated team approach during an Inpatient Acute Rehabilitation admission.  The patient will receive intensive therapy as well as Rehabilitation physician, nursing, social worker, and care management interventions.  Due to safety, disease management, medication administration, pain management, and patient education the patient requires 24 hour a day rehabilitation nursing.  The patient is currently min assist with mobility and basic ADLs.  Discharge setting and therapy post discharge at home with home health is anticipated.  Patient has agreed to participate in the Acute Inpatient Rehabilitation Program and will admit today.   Preadmission Screen Completed By:  Michel Santee, PT, DPT  02/09/2023 4:59 PM ______________________________________________________________________   Discussed status with Dr. Naaman Plummer on 02/10/23  at 10:14 AM and received approval for admission today.   Admission Coordinator:  Michel Santee, PT, DPT time 10:14 AM Sudie Grumbling 02/10/23      Assessment/Plan: Diagnosis: debility after COVID infection Does the need for close, 24 hr/day Medical supervision in concert with the patient's rehab needs make it unreasonable for this patient to be served in a less intensive setting? Yes Co-Morbidities requiring supervision/potential complications: renal cell CA, CAD, CKD, HTN Due to bladder management, bowel management, safety, skin/wound care, disease management, medication administration, pain management, and  patient education, does the patient require 24 hr/day rehab nursing? Yes Does the patient require coordinated care of a physician, rehab nurse, PT, OT to address physical and functional deficits in the context of the above medical diagnosis(es)? Yes Addressing deficits in the following areas: balance, endurance, locomotion, strength, transferring, bowel/bladder control, bathing, dressing, feeding, grooming, toileting, and psychosocial support Can the patient actively participate in an intensive therapy program of at least 3 hrs of therapy 5 days a week? Yes The potential for patient to make measurable gains while on inpatient rehab is excellent Anticipated functional outcomes upon discharge from inpatient rehab: modified independent and supervision PT, modified independent and supervision OT, n/a SLP Estimated rehab length of stay to reach the above functional goals is: 10-12 days Anticipated discharge destination: Home 10. Overall Rehab/Functional Prognosis: excellent     MD Signature: Meredith Staggers, MD, [place] Director Rehabilitation Services 02/10/2023

## 2023-02-10 NOTE — H&P (Signed)
Physical Medicine and Rehabilitation Admission H&P        Chief Complaint  Patient presents with   Functional deficits due to Covid infection.       HPI:  Donald Todd. Levi is a 89 year old male with history of CAD, Renal cancer w/ intra abdominal/psoas muscle mets) s/p left nephrectomy, CKD, prostate CA, HTN, legally blind, chronic vertigo who was admitted on 02/03/23 to [facility] with 2 day hx of cough, sore throat, weakness and dizziness with poor po intake. He was found to be positive for Covid 19 virus and admitted due to concerns of hypoxia. He was started on Paxlovid and low dose decadron. He was noted to have diarrhea question due to viral illness v/s Paxlovid. Dr. Doylene Canard consulted due to concerns of bradycardia and recommended d/c of BB which has improved HR.  He was noted to have worsening of renal status therefore enalapril d/c and amlodipine added for BP control. Chronic dizziness being managed with meclizine prn. Thrombocytopenia felt to be due to viral illness and has resolved. He started developing delirium 03/06 pm requiring haldol and low dose Seroquel added to last night to help with sleep hygiene.   PT/OT has been working with patient who was noted to be limited by weakness with unsteady gait and balance deficits. CIR recommended due to functional decline.      Review of Systems  Constitutional:  Positive for malaise/fatigue. Negative for fever.  HENT:  Negative for hearing loss.   Eyes:  Positive for blurred vision.  Respiratory:  Positive for cough.   Cardiovascular:  Negative for chest pain.  Gastrointestinal:  Negative for nausea and vomiting.  Genitourinary:  Negative for dysuria.  Musculoskeletal:  Negative for myalgias.  Skin:  Negative for rash.  Neurological:  Positive for weakness.  Psychiatric/Behavioral:  Negative for depression.             Past Medical History:  Diagnosis Date   Arthritis     CAD (coronary artery disease)      last cath. 07-09-2010    Cancer [facility])      kidney cancer , prostate cancer    Chronic kidney disease      lt kidnet removed   H/O cardiovascular stress test 08-04-2010    ef 55% NO ISCHEMIA   H/O unilateral nephrectomy      lt. nephrectomy   Heart attack ([place]) 1993   History of kidney stones     Hyperlipemia     Hypertension      renal dopplers 218.13-normal patency   Inguinal hernia 08/01/11    right   Macular degeneration     Prostate cancer [facility])     Prostate disease      Cancer S/P seed implant   Renal mass     Vertigo             Past Surgical History:  Procedure Laterality Date   ANGIOPLASTY   1993   athroscopic knee surgery   2002    right   bone scan   2005   CARDIAC CATHETERIZATION   07-09-2010    normal left ventricular function, coronary obsstructive disease with 20% proximal an 30-40% mid lt. anterior descending narrowing ; widely patent stent in the proximal ramus intermediate vessel; 50-70-% narrowing in the mid circumflex,and widely patent stent  in the rt. coronary    CARDIAC CATHETERIZATION   12-22-2003    widely patent stents with noncritical coronary artery disease and normalLV function  CHOLECYSTECTOMY   05/05/2009    Laparoscopic   colonscopy   2004    with biopsy   CORONARY ANGIOPLASTY WITH STENT PLACEMENT   03-15-1996    pci to rci and ramus branch using j&j [id number] stent,post dialated  at  20atm (rca)  and 14 atm (ramus branch. ef 55%             HERNIA REPAIR   08/01/11    RIH   INGUINAL HERNIA REPAIR Left 03/29/2013    Procedure: HERNIA REPAIR INGUINAL ADULT;  Surgeon: Edward Jolly, MD;  Location: [facility];  Service: General;  Laterality: Left;   INSERTION OF MESH Left 03/29/2013    Procedure: INSERTION OF MESH;  Surgeon: Edward Jolly, MD;  Location: [facility];  Service: General;  Laterality: Left;   JOINT REPLACEMENT   2006 and 2010    left 2006, right 2010   KIDNEY SURGERY   2002 and 2005    2002 - partial removal of left. 2005 complete removal   PROSTATE BIOPSY        prostate seed implant       radiation treatment   2002    prostate   REVERSE SHOULDER ARTHROPLASTY Left 05/26/2017    Procedure: REVERSE LEFT TOTAL SHOULDER ARTHROPLASTY;  Surgeon: Netta Cedars, MD;  Location: [place];  Service: Orthopedics;  Laterality: Left;   stent implants   1997           Family History  Problem Relation Age of Onset   Cancer Mother          stomach?   Stroke Father     Stroke Brother     Skin cancer Brother        Social History:  Married. Wife w/covid/UTI currently. He  reports that he quit smoking about 59 years ago. His smoking use included cigarettes and cigars. He has a 3.25 pack-year smoking history. He has never used smokeless tobacco. He reports that he does not drink alcohol and does not use drugs.          Allergies  Allergen Reactions   Tape Hives      Surgical tape - causes blisters   Hydrocodone        Shaking uncontrollably    Iohexol         Desc: PT DOES RECALL REACTION JUST SAYS IT WAS A LONG TIME AGO FOR KIDNEY X-RAYS AND HE DIDN'T TOLERATE IT WELL-ARS 05/02/09-NOTE E-CHART STATES A HOT FEELING IS HIS REACTION, BUT HE CAN'T CONFIRM THAT WAS ALL     Oxycodone Other (See Comments)      Shaking uncontrollably             Medications Prior to Admission  Medication Sig Dispense Refill   acetaminophen (TYLENOL) 500 MG tablet Take 1,000 mg by mouth every 6 (six) hours as needed for moderate pain.       amLODipine (NORVASC) 10 MG tablet TAKE 1 TABLET DAILY 90 tablet 3   aspirin 81 MG tablet Take 81 mg by mouth at bedtime.       atorvastatin (LIPITOR) 40 MG tablet TAKE 1 TABLET AT BEDTIME 90 tablet 3   calcium carbonate (TUMS - DOSED IN MG ELEMENTAL CALCIUM) 500 MG chewable tablet Chew 1,000 mg by mouth daily as needed for indigestion or heartburn.       docusate sodium (COLACE) 100 MG capsule Take 1 capsule (100 mg total) by mouth every 12 (twelve) hours. (Patient taking differently: Take  200 mg by mouth daily.) 60 capsule 0   enalapril  (VASOTEC) 10 MG tablet TAKE 1 TABLET DAILY 90 tablet 3   ipratropium (ATROVENT) 0.03 % nasal spray Place 1-2 sprays into both nostrils 2 (two) times daily as needed (allergies).       loratadine (CLARITIN) 10 MG tablet Take 10 mg by mouth daily.       meclizine (ANTIVERT) 25 MG tablet Take 25 mg by mouth as needed for dizziness.       metoprolol succinate (TOPROL-XL) 25 MG 24 hr tablet TAKE 1 TABLET DAILY (Patient taking differently: Take 25 mg by mouth daily.) 90 tablet 3   Multiple Vitamins-Minerals (PRESERVISION AREDS 2) CAPS Take 1 capsule by mouth 2 (two) times daily.       nitroGLYCERIN (NITROSTAT) 0.4 MG SL tablet Place 1 tablet (0.4 mg total) under the tongue as needed. 25 tablet 3   polyethylene glycol (MIRALAX / GLYCOLAX) 17 g packet Take 17 g by mouth daily. (Patient not taking: Reported on 02/03/2023) 14 each 2          Home: Home Living Family/patient expects to be discharged to:: Private residence Living Arrangements: Spouse/significant other, Children Available Help at Discharge: Available PRN/intermittently Home Access: Stairs to enter Entrance Stairs-Number of Steps: 1 Home Layout: Multi-level, Able to live on main level with bedroom/bathroom Alternate Level Stairs-Rails: Left Bathroom Shower/Tub: Multimedia programmer: Handicapped height Home Equipment: [place] - built in (3 wheeled RW) Additional Comments: wife on a rollator, unable to assist patient, daughter lives close by   Functional History: Prior Function Mobility Comments: does not drive   Functional Status:  Mobility: Bed Mobility Overal bed mobility: Needs Assistance Bed Mobility: Supine to Sit Supine to sit: Supervision, HOB elevated General bed mobility comments: HOB almost fully elevated, use of bed rails PRN Transfers Overall transfer level: Needs assistance Equipment used: Rolling walker (2 wheels) Transfers: Sit to/from Stand Sit to Stand: Supervision General transfer comment: STS  from [place] with RW, pushes to stand with BUE Ambulation/Gait Ambulation/Gait assistance: Min guard, Supervision Gait Distance (Feet): 40 Feet (+ 40 ft) Assistive device: Rolling walker (2 wheels) Gait Pattern/deviations: Decreased stride length, Decreased step length - left, Decreased step length - right General Gait Details: Pt ambulates 2 laps in room x 2 times. Gait velocity: decreased   ADL: ADL Overall ADL's : Needs assistance/impaired Eating/Feeding: Independent Grooming: Set up, Sitting Upper Body Bathing: Set up, Sitting Lower Body Bathing: Minimal assistance, Sit to/from stand Upper Body Dressing : Set up, Sitting Lower Body Dressing: Minimal assistance, Sit to/from stand Lower Body Dressing Details (indicate cue type and reason): min assist to don shoes Toilet Transfer: Minimal assistance, Regular Toilet, Rolling walker (2 wheels) Toilet Transfer Details (indicate cue type and reason): min assist to power up from toilet Toileting- Clothing Manipulation and Hygiene: Moderate assistance, Sit to/from stand Toileting - Clothing Manipulation Details (indicate cue type and reason): mod assist for toileting Functional mobility during ADLs: Minimal assistance, Rolling walker (2 wheels)   Cognition: Cognition Overall Cognitive Status: Within Functional Limits for tasks assessed Orientation Level: Oriented to person, Disoriented to place, Disoriented to time, Disoriented to situation Cognition Arousal/Alertness: Awake/alert Behavior During Therapy: WFL for tasks assessed/performed Overall Cognitive Status: Within Functional Limits for tasks assessed Area of Impairment: Orientation Orientation Level: Disoriented to, Place, Time, Situation General Comments: Follows commands throughout session, answers questions appropriately.   Physical Exam: Blood pressure 138/68, pulse 82, temperature 98.3 F (36.8 C), temperature source  Oral, resp. rate 16, height '5\' 7"'$  (1.702 m),  weight 62.6 kg, SpO2 99 %. Physical Exam Constitutional:      Appearance: He is ill-appearing.  HENT:     Head: Normocephalic.     Right Ear: External ear normal.     Left Ear: External ear normal.     Nose: Nose normal.     Mouth/Throat:     Mouth: Mucous membranes are moist.  Eyes:     Extraocular Movements: Extraocular movements intact.     Pupils: Pupils are equal, round, and reactive to light.     Comments: Vision poor  Cardiovascular:     Rate and Rhythm: Normal rate and regular rhythm.  Pulmonary:     Effort: Pulmonary effort is normal. No respiratory distress.     Breath sounds: Rhonchi present.     Comments: Intermittent cough Abdominal:     General: Bowel sounds are normal.     Palpations: Abdomen is soft.  Musculoskeletal:        General: No swelling or tenderness.     Cervical back: Normal range of motion.  Skin:    General: Skin is warm.     Findings: Bruising present.  Neurological:     Mental Status: He is alert.     Comments: Alert and oriented x 3. Normal insight and awareness. Intact Memory. Normal language and speech. Cranial nerve exam unremarkable. UE 5/5 prox to distal. LE: 4- HF, KE and 4+/5 ADF/PF. Sensory exam normal for light touch and pain in all 4 limbs. No limb ataxia or cerebellar signs. No abnormal tone appreciated.    Psychiatric:        Mood and Affect: Mood normal.        Behavior: Behavior normal.        Lab Results Last 48 Hours        Results for orders placed or performed during the hospital encounter of 02/03/23 (from the past 48 hour(s))  Basic metabolic panel     Status: Abnormal    Collection Time: 02/09/23  5:44 AM  Result Value Ref Range    Sodium 135 135 - 145 mmol/L    Potassium 4.4 3.5 - 5.1 mmol/L    Chloride 102 98 - 111 mmol/L    CO2 26 22 - 32 mmol/L    Glucose, Bld 156 (H) 70 - 99 mg/dL      Comment: Glucose reference range applies only to samples taken after fasting for at least 8 hours.    BUN 45 (H) 8 - 23 mg/dL     Creatinine, Ser 1.55 (H) 0.61 - 1.24 mg/dL    Calcium 8.4 (L) 8.9 - 10.3 mg/dL    GFR, Estimated 42 (L) >60 mL/min      Comment: (NOTE) Calculated using the CKD-EPI Creatinine Equation (2021)      Anion gap 7 5 - 15      Comment: Performed at [facility], [place] [address] [place], [place] [id number]  Basic metabolic panel     Status: Abnormal    Collection Time: 02/10/23  6:07 AM  Result Value Ref Range    Sodium 132 (L) 135 - 145 mmol/L    Potassium 4.6 3.5 - 5.1 mmol/L    Chloride 102 98 - 111 mmol/L    CO2 25 22 - 32 mmol/L    Glucose, Bld 125 (H) 70 - 99 mg/dL      Comment: Glucose reference range applies only to samples  taken after fasting for at least 8 hours.    BUN 43 (H) 8 - 23 mg/dL    Creatinine, Ser 1.40 (H) 0.61 - 1.24 mg/dL    Calcium 8.2 (L) 8.9 - 10.3 mg/dL    GFR, Estimated 47 (L) >60 mL/min      Comment: (NOTE) Calculated using the CKD-EPI Creatinine Equation (2021)      Anion gap 5 5 - 15      Comment: Performed at [facility], [place] [address], [place], [place] [address]      Imaging Results (Last 48 hours)  No results found.         Blood pressure 138/68, pulse 82, temperature 98.3 F (36.8 C), temperature source Oral, resp. rate 16, height '5\' 7"'$  (1.702 m), weight 62.6 kg, SpO2 99 %.   Medical Problem List and Plan: 1. Functional deficits secondary to debility related to COVID and multiple medical issues             -patient may shower             -ELOS/Goals: 10-12 days, supervision to mod I goals with PT, OT 2.  Antithrombotics: -DVT/anticoagulation:  Pharmaceutical: Lovenox--discussed with pt the need for lovenox injections today.              -antiplatelet therapy: ASA.  3. Pain Management: Tylenol prn 4. Mood/Behavior/Sleep: LCSW to follow for evaluation and suppor.t              -antipsychotic agents: continue Seroquel at bedtime.  5. Neuropsych/cognition: This patient is capable of making decisions on  his own behalf. 6. Skin/Wound Care: Routine pressure relief measures.  7. Fluids/Electrolytes/Nutrition: Monitor I/O. Check CMET in am             --Ensure added for malnutrition.  8. Covid PNA: Has completed Paxlovid/decadron to complete tomorrow. Respiratory precautions thru 03/11.              --pulmonary hygiene with flutter valve.  9. Delirium w/agitation/hallucinations: Monitor sleep/wake cycle. Continue Seroquel.  10. Hyponatremia: Recheck Na level in am 11. Acute on CKD: SCr was 1.4. Has had left nephrectomy for CA.   --has had worsening of renal status BUN/Scr 22/1.45-->45/1.55 --Encourage fluids. Recheck in am.   12.  Anemia/thrombocytopenia: admission Hgb/Plt-11.2/91 -->12.2/151             --Likely improved due to hemoconcentration. Recheck in am.  29. CAD s/p PTCA: On ASA and Lipitor. BB was d/c due to bradycardia. '             --has been off enalapril due to acute on chronic renal failure.  14. Chronic dizziness/Legally blind (macula degeneration): Meclizine prn. --Fall precautions.      Bary Leriche, PA-C 02/10/2023  I have personally performed a face to face diagnostic evaluation of this patient and formulated the key components of the plan.  Additionally, I have personally reviewed laboratory data, imaging studies, as well as relevant notes and concur with the physician assistant's documentation above.  The patient's status has not changed from the original H&P.  Any changes in documentation from the acute care chart have been noted above.  Meredith Staggers, MD, Mellody Drown

## 2023-02-10 NOTE — Plan of Care (Signed)
  Problem: Safety: Goal: Non-violent Restraint(s) 02/10/2023 1016 by Mohammed Kindle, RN Outcome: Adequate for Discharge 02/10/2023 1015 by Mohammed Kindle, RN Outcome: Adequate for Discharge

## 2023-02-10 NOTE — Progress Notes (Addendum)
Inpatient Rehabilitation Admission Medication Review by a Pharmacist  A complete drug regimen review was completed for this patient to identify any potential clinically significant medication issues.  High Risk Drug Classes Is patient taking? Indication by Medication  Antipsychotic Yes Seroquel - delirium Compazine - nausea  Anticoagulant Yes Lovenox - vte ppx  Antibiotic No   Opioid No   Antiplatelet Yes Aspirin - CAD  Hypoglycemics/insulin No   Vasoactive Medication Yes Amlodipine - HTN Nitroglycerin - angina  Chemotherapy No   Other Yes Atorvastatin - HLD Dexamethasone - COVID Atrovent - allergies Meclizine - dizziness Trazodone - sleep     Type of Medication Issue Identified Description of Issue Recommendation(s)  Drug Interaction(s) (clinically significant)     Duplicate Therapy     Allergy     No Medication Administration End Date     Incorrect Dose     Additional Drug Therapy Needed     Significant med changes from prior encounter (inform family/care partners about these prior to discharge).    Other  PTA med: vasotec Restart PTA meds when and if necessary during CIR admission or at time of discharge, if warranted    Clinically significant medication issues were identified that warrant physician communication and completion of prescribed/recommended actions by midnight of the next day:  No   Time spent performing this drug regimen review (minutes):  30   Chesley Noon, PharmD Candidate  02/10/2023 12:20 PM

## 2023-02-10 NOTE — Discharge Summary (Signed)
Physician Discharge Summary  Donald Todd [id number] DOB: [birth year] DOA: 02/03/2023  PCP: [name], MD  Admit date: 02/03/2023 Discharge date: 02/10/2023  Admitted From: Home Disposition:  Home  Discharge Condition:Stable CODE STATUS:FULL, DNR, Comfort Care Diet recommendation: Heart Healthy / Carb Modified / Regular / Dysphagia   Brief/Interim Summary: Patient is a 89 year old male with history of prostate cancer status post seeding, metastatic renal cancer, hypertension, stage III CKD, coronary artery disease status post RCA stenting who presented with 2-day history of sore throat, cough, weakness.  COVID screen test was positive on presentation.  Admitted for symptomatic COVID infection.  Started on steroids, Paxlovid.  PT/OT recommending acute inpatient rehab . TOC following.  Hospital course remarkable for development of delirium,likely hospital acquired , now mostly resolved.  He looks comfortable, alert and awake today.  He has a bed at inpatient rehab today, medically stable for discharge.  Following problems were addressed during the hospitalization:  COVID infection: Presented with sore throat, cough, weakness.  Chest x-ray on presentation did not show any pneumonia.  Started on Paxlovid, steroids.  Currently on tapering Decadron.  Inflammatory markers improved.  Currently on room air.  Continue supportive care with cough medications, .  Denies shortness of breath or cough.   Hospital acquired delirium: Patient got confused on 3/7 afternoon.   Started on low-dose Seroquel.  Looks much better today.  Alert, awake and mostly oriented.  Can continue Seroquel at CIR   Diarrhea: Likely secondary to viral illness.  resolved now   Bradycardia: Resolved.  Beta-blocker discontinued   Leukopenia: Most likely from [place].  Resolved   Chronic dizziness/weakness: Also has macular degeneration.  Continue meclizine as needed for dizziness.  Complains of dizziness on ambulation.    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  Follows with [facility].   History of hypertension: Currently on amlodipine 10 mg daily. ACE inhibitor discontinued   History of renal artery stenosis, CKD stage IIIa: Currently kidney function at baseline.  Baseline creatinine around 1.3-1.5.   History of coronary artery disease: On Aspirin, Lipitor.  Status post right PCI   Debility/deconditioning: Patient seen by PT/OT, recommended acute inpatient rehab .CODE STATUS DNR.       Discharge Diagnoses:  Principal Problem:   COVID-19 virus infection Active Problems:   CAD S/P percutaneous coronary angioplasty   Essential hypertension   History of nephrectomy- Lt   Chronic renal impairment, stage 3 (moderate) (HCC)   Malignant neoplasm metastatic to right lung [facility])   Metastatic renal cell carcinoma to intra-abdominal site (HCC)   Bradycardia with 51-60 beats per minute   Thrombocytopenia ([place])   COVID-19    Discharge Instructions  Discharge Instructions     Diet general   Complete by: As directed    Discharge instructions   Complete by: As directed    1)Please take your medications as instructed   Increase activity slowly   Complete by: As directed       Allergies as of 02/10/2023       Reactions   Tape Hives   Surgical tape - causes blisters   Hydrocodone    Shaking uncontrollably    Iohexol     Desc: PT DOES RECALL REACTION JUST SAYS IT WAS A LONG TIME AGO FOR KIDNEY X-RAYS AND HE DIDN'T TOLERATE IT WELL-ARS 05/02/09-NOTE E-CHART STATES A HOT FEELING IS HIS REACTION, BUT HE CAN'T CONFIRM THAT WAS  ALL   Oxycodone Other (See Comments)   Shaking uncontrollably         Medication List     STOP taking these medications    enalapril 10 MG tablet Commonly known as: VASOTEC   metoprolol succinate 25 MG 24 hr tablet Commonly known as: TOPROL-XL        TAKE these medications    acetaminophen 500 MG tablet Commonly known as: TYLENOL Take 1,000 mg by mouth every 6 (six) hours as needed for moderate pain.   amLODipine 10 MG tablet Commonly known as: NORVASC TAKE 1 TABLET DAILY   aspirin 81 MG tablet Take 81 mg by mouth at bedtime.   atorvastatin 40 MG tablet Commonly known as: LIPITOR TAKE 1 TABLET AT BEDTIME   calcium carbonate 500 MG chewable tablet Commonly known as: TUMS - dosed in mg elemental calcium Chew 1,000 mg by mouth daily as needed for indigestion or heartburn.   dexamethasone 2 MG tablet Commonly known as: DECADRON Take 1 tablet (2 mg total) by mouth daily for 1 day. Start taking on: February 11, 2023   docusate sodium 100 MG capsule Commonly known as: COLACE Take 1 capsule (100 mg total) by mouth every 12 (twelve) hours. What changed:  how much to take when to take this   guaiFENesin-dextromethorphan 100-10 MG/5ML syrup Commonly known as: ROBITUSSIN DM Take 5 mLs by mouth every 4 (four) hours as needed for cough (chest congestion).   ipratropium 0.03 % nasal spray Commonly known as: ATROVENT Place 1-2 sprays into both nostrils 2 (two) times daily as needed (allergies).   loratadine 10 MG tablet Commonly known as: CLARITIN Take 10 mg by mouth daily.   meclizine 25 MG tablet Commonly known as: ANTIVERT Take 25 mg by mouth as needed for dizziness.   nitroGLYCERIN 0.4 MG SL tablet Commonly known as: NITROSTAT Place 1 tablet (0.4 mg total) under the tongue as needed.   polyethylene glycol 17 g packet Commonly known as: MIRALAX / GLYCOLAX Take 17 g by mouth daily.   PreserVision AREDS 2 Caps Take 1 capsule by mouth 2 (two) times daily.   QUEtiapine 25 MG tablet Commonly known as: SEROQUEL Take 1 tablet (25 mg total) by mouth at bedtime.        Follow-up Information     [name], MD. Schedule an appointment as soon as possible for a visit in 1 week(s).   Specialty: Family  Medicine Contact information: [id number] [place] [address] [phone]                Allergies  Allergen Reactions   Tape Hives    Surgical tape - causes blisters   Hydrocodone     Shaking uncontrollably    Iohexol      Desc: PT DOES RECALL REACTION JUST SAYS IT WAS A LONG TIME AGO FOR KIDNEY X-RAYS AND HE DIDN'T TOLERATE IT WELL-ARS 05/02/09-NOTE E-CHART STATES A HOT FEELING IS HIS REACTION, BUT HE CAN'T CONFIRM THAT WAS ALL    Oxycodone Other (See Comments)    Shaking uncontrollably     Consultations: None   Procedures/Studies: DG Chest 2 View  Result Date: 02/03/2023 CLINICAL DATA:  Short of breath.  Cough. EXAM: CHEST - 2 VIEW COMPARISON:  CT 02/01/2023. FINDINGS: Lateral view degraded by patient arm position. Right shoulder osteoarthritis with high-riding humeral head indicative of chronic rotator cuff insufficiency. Left shoulder arthroplasty. Right hemidiaphragm elevation. Multiple leads and wires project over the chest on the frontal  view. Midline trachea. Normal heart size. Atherosclerosis in the transverse aorta. No pleural effusion or pneumothorax. No congestive failure. Clear lungs. Remote left rib fractures. IMPRESSION: No acute cardiopulmonary disease. Electronically Signed   By: Abigail Miyamoto M.D.   On: 02/03/2023 10:02   CT CHEST ABDOMEN PELVIS WO CONTRAST  Result Date: 02/02/2023 CLINICAL DATA:  A 89 year old male presents for evaluation of renal cell carcinoma recurrence. * Tracking Code: BO * EXAM: CT CHEST, ABDOMEN AND PELVIS WITHOUT CONTRAST TECHNIQUE: Multidetector CT imaging of the chest, abdomen and pelvis was performed following the standard protocol without IV contrast. RADIATION DOSE REDUCTION: This exam was performed according to the departmental dose-optimization program which includes automated exposure control, adjustment of the mA and/or kV according to patient size and/or use of iterative reconstruction technique. COMPARISON:  August 01, 2022 FINDINGS: CT CHEST FINDINGS Cardiovascular: Stable appearance of the heart great vessels with calcified coronary artery disease, mild to moderate cardiomegaly and calcified aortic atherosclerosis. Heart with mass effect from moderate to marked pectus deformity similar to prior imaging as well. Mediastinum/Nodes: No thoracic inlet, axillary, mediastinal or hilar adenopathy. Esophagus grossly normal. Lungs/Pleura: Branching nodularity in the anterior LEFT upper lobe is stable potentially mucoid impaction, unchanged again since August of 2023 in total on single axial image measuring 10 x 6 mm, essentially stable dating back to 2022 as well. Slowly increasing size of a nodule in the anterior RIGHT middle lobe 11 x 9 mm, within 1 mm of most recent comparison imaging enlarged from 9 x 8 mm in April of 2022. Parenchymal distortion in the RIGHT chest with similar appearance. Biapical pleural and parenchymal scarring. Airways are patent. No pleural effusion. Stable Peri fissural nodule on image 64/4 in the RIGHT upper lobe 6 mm. Musculoskeletal: See below for full musculoskeletal details. No chest wall mass. Signs of pectus excavatum. CT ABDOMEN PELVIS FINDINGS Hepatobiliary: Lobular hepatic contours similar to prior imaging with changes of cholecystectomy. Pancreas: Pancreas with normal contours, no signs of inflammation or peripancreatic fluid. Spleen: Normal size and contour. Adrenals/Urinary Tract: Post LEFT nephrectomy and adrenalectomy. Stable low-density RIGHT adrenal lesion measuring 17 mm, unchanged since 2022. Compatible with RIGHT adrenal adenoma. Low-density RIGHT renal lesion measuring water density with homogeneous appearance compatible with a cyst 3.4 cm also without change. No ureteral dilation or perivesical stranding. Stomach/Bowel: Unchanged herniation of the colon into the abdominal wall laxity/flank hernia on the LEFT flank with broad-based defect in abdominal wall musculature, overlying  external oblique may be intact and again findings are not changed. There is moderate to marked gastric distension which is little changed and without adjacent stranding. There is no sign of small bowel obstruction or inflammation. Appendix not visible, no secondary signs to suggest acute appendiceal process. Colonic diverticulosis. Vascular/Lymphatic: Marked calcified aortic atherosclerosis without aneurysm. Ill-defined loss of normal muscular architecture about the LEFT L1 transverse process associated with the psoas muscle is stable. No discrete adenopathy in the retroperitoneum. No adenopathy of the pelvis. Reproductive: Brachytherapy seeds in the prostate. Other: No ascites. Musculoskeletal: No change in the appearance of site of known muscular metastatic lesion. Spinal degenerative changes. Post LEFT glenohumeral joint arthroplasty with reverse shoulder arthroplasty. No destructive bone process. IMPRESSION: 1. Slowly increasing size of a nodule in the anterior RIGHT middle lobe, within 1 mm of most recent comparison imaging mildly enlarged from 9 x 8 mm in April of 2022. Stable appearance of small upper lobe nodules potentially post infectious or inflammatory in the LEFT upper lobe. Perhaps minimally increased  size of the RIGHT upper lobe nodule since 2022 but without change in the short interval compared to the August 2023 evaluation. 2. Unchanged appearance of site of known intramuscular metastatic process following LEFT nephrectomy. 3. No new findings. 4. Aortic atherosclerosis and coronary artery disease. 5. Colonic diverticulosis Aortic Atherosclerosis (ICD10-I70.0). Electronically Signed   By: Zetta Bills M.D.   On: 02/02/2023 11:35      Subjective: Patient seen and examined at bedside today.  Alert, awake and mostly oriented today.  Denies any complaints besides some dizziness on ambulation.  Looks comfortable.  I called and discussed about discharge planning with the daughter on  phone  Discharge Exam: Vitals:   02/09/23 2009 02/10/23 0611  BP: 111/66 138/68  Pulse: 80 82  Resp: 18 16  Temp: 98.1 F (36.7 C) 98.3 F (36.8 C)  SpO2: 98% 99%   Vitals:   02/09/23 1226 02/09/23 1716 02/09/23 2009 02/10/23 0611  BP: 116/66 133/69 111/66 138/68  Pulse: 77 84 80 82  Resp: '20 16 18 16  '$ Temp: 97.9 F (36.6 C) 97.9 F (36.6 C) 98.1 F (36.7 C) 98.3 F (36.8 C)  TempSrc: Oral Oral Oral Oral  SpO2: 98% 98% 98% 99%  Weight:      Height:        General: Pt is alert, awake, not in acute distress Cardiovascular: RRR, S1/S2 +, no rubs, no gallops Respiratory: CTA bilaterally, no wheezing, no rhonchi Abdominal: Soft, NT, ND, bowel sounds + Extremities: no edema, no cyanosis    The results of significant diagnostics from this hospitalization (including imaging, microbiology, ancillary and laboratory) are listed below for reference.     Microbiology: Recent Results (from the past 240 hour(s))  Resp panel by RT-PCR (RSV, Flu A&B, Covid) Anterior Nasal Swab     Status: Abnormal   Collection Time: 02/03/23  9:33 AM   Specimen: Anterior Nasal Swab  Result Value Ref Range Status   SARS Coronavirus 2 by RT PCR POSITIVE (A) NEGATIVE Final    Comment: (NOTE) SARS-CoV-2 target nucleic acids are DETECTED.  The SARS-CoV-2 RNA is generally detectable in upper respiratory specimens during the acute phase of infection. Positive results are indicative of the presence of the identified virus, but do not rule out bacterial infection or co-infection with other pathogens not detected by the test. Clinical correlation with patient history and other diagnostic information is necessary to determine patient infection status. The expected result is Negative.  Fact Sheet for Patients: EntrepreneurPulse.com.au  Fact Sheet for Healthcare Providers: IncredibleEmployment.be  This test is not yet approved or cleared by the [place] FDA  and  has been authorized for detection and/or diagnosis of SARS-CoV-2 by FDA under an Emergency Use Authorization (EUA).  This EUA will remain in effect (meaning this test can be used) for the duration of  the COVID-19 declaration under Section 564(b)(1) of the A ct, 21 U.S.C. section 360bbb-3(b)(1), unless the authorization is terminated or revoked sooner.     Influenza A by PCR NEGATIVE NEGATIVE Final   Influenza B by PCR NEGATIVE NEGATIVE Final    Comment: (NOTE) The Xpert Xpress SARS-CoV-2/FLU/RSV plus assay is intended as an aid in the diagnosis of influenza from Nasopharyngeal swab specimens and should not be used as a sole basis for treatment. Nasal washings and aspirates are unacceptable for Xpert Xpress SARS-CoV-2/FLU/RSV testing.  Fact Sheet for Patients: EntrepreneurPulse.com.au  Fact Sheet for Healthcare Providers: IncredibleEmployment.be  This test is not yet approved or cleared by the [place]  States FDA and has been authorized for detection and/or diagnosis of SARS-CoV-2 by FDA under an Emergency Use Authorization (EUA). This EUA will remain in effect (meaning this test can be used) for the duration of the COVID-19 declaration under Section 564(b)(1) of the Act, 21 U.S.C. section 360bbb-3(b)(1), unless the authorization is terminated or revoked.     Resp Syncytial Virus by PCR NEGATIVE NEGATIVE Final    Comment: (NOTE) Fact Sheet for Patients: EntrepreneurPulse.com.au  Fact Sheet for Healthcare Providers: IncredibleEmployment.be  This test is not yet approved or cleared by the [place] FDA and has been authorized for detection and/or diagnosis of SARS-CoV-2 by FDA under an Emergency Use Authorization (EUA). This EUA will remain in effect (meaning this test can be used) for the duration of the COVID-19 declaration under Section 564(b)(1) of the Act, 21 U.S.C. section 360bbb-3(b)(1),  unless the authorization is terminated or revoked.  Performed at [facility], [address] [place], [place] [address]      Labs: BNP (last 3 results) No results for input(s): "BNP" in the last 8760 hours. Basic Metabolic Panel: Recent Labs  Lab 02/06/23 0509 02/07/23 0544 02/08/23 0541 02/09/23 0544 02/10/23 0607  NA 135 135 135 135 132*  K 3.9 3.7 4.4 4.4 4.6  CL 101 101 103 102 102  CO2 '25 22 24 26 25  '$ GLUCOSE 255* 169* 131* 156* 125*  BUN 20 26* 37* 45* 43*  CREATININE 1.15 1.32* 1.47* 1.55* 1.40*  CALCIUM 8.4* 8.8* 8.4* 8.4* 8.2*   Liver Function Tests: Recent Labs  Lab 02/04/23 0559 02/05/23 0901  AST 30 37  ALT 23 26  ALKPHOS 52 65  BILITOT 0.6 1.1  PROT 5.6* 6.2*  ALBUMIN 3.0* 3.2*   No results for input(s): "LIPASE", "AMYLASE" in the last 168 hours. No results for input(s): "AMMONIA" in the last 168 hours. CBC: Recent Labs  Lab 02/04/23 0559 02/05/23 0901 02/06/23 0509 02/07/23 0544 02/08/23 0541  WBC 4.0 3.0* 1.9* 9.5 9.6  NEUTROABS  --  1.9 1.5* 8.6* 8.7*  HGB 10.9* 12.5* 13.2 13.9 12.2*  HCT 34.9* 37.9* 39.6 40.6 36.9*  MCV 96.4 92.9 92.1 90.4 90.9  PLT 84* 100* 100* 154 151   Cardiac Enzymes: No results for input(s): "CKTOTAL", "CKMB", "CKMBINDEX", "TROPONINI" in the last 168 hours. BNP: Invalid input(s): "POCBNP" CBG: No results for input(s): "GLUCAP" in the last 168 hours. D-Dimer Recent Labs    02/08/23 0541  DDIMER 0.60*   Hgb A1c No results for input(s): "HGBA1C" in the last 72 hours. Lipid Profile No results for input(s): "CHOL", "HDL", "LDLCALC", "TRIG", "CHOLHDL", "LDLDIRECT" in the last 72 hours. Thyroid function studies No results for input(s): "TSH", "T4TOTAL", "T3FREE", "THYROIDAB" in the last 72 hours.  Invalid input(s): "FREET3" Anemia work up No results for input(s): "VITAMINB12", "FOLATE", "FERRITIN", "TIBC", "IRON", "RETICCTPCT" in the last 72 hours. Urinalysis    Component Value Date/Time    COLORURINE YELLOW 03/09/2021 2110   APPEARANCEUR CLEAR 03/09/2021 2110   LABSPEC 1.020 03/09/2021 2110   PHURINE 6.5 03/09/2021 2110   GLUCOSEU NEGATIVE 03/09/2021 2110   HGBUR NEGATIVE 03/09/2021 2110   BILIRUBINUR NEGATIVE 03/09/2021 2110   KETONESUR 15 (A) 03/09/2021 2110   PROTEINUR NEGATIVE 03/09/2021 2110   UROBILINOGEN 0.2 07/08/2010 1550   NITRITE NEGATIVE 03/09/2021 2110   LEUKOCYTESUR NEGATIVE 03/09/2021 2110   Sepsis Labs Recent Labs  Lab 02/05/23 0901 02/06/23 0509 02/07/23 0544 02/08/23 0541  WBC 3.0* 1.9* 9.5 9.6   Microbiology Recent  Results (from the past 240 hour(s))  Resp panel by RT-PCR (RSV, Flu A&B, Covid) Anterior Nasal Swab     Status: Abnormal   Collection Time: 02/03/23  9:33 AM   Specimen: Anterior Nasal Swab  Result Value Ref Range Status   SARS Coronavirus 2 by RT PCR POSITIVE (A) NEGATIVE Final    Comment: (NOTE) SARS-CoV-2 target nucleic acids are DETECTED.  The SARS-CoV-2 RNA is generally detectable in upper respiratory specimens during the acute phase of infection. Positive results are indicative of the presence of the identified virus, but do not rule out bacterial infection or co-infection with other pathogens not detected by the test. Clinical correlation with patient history and other diagnostic information is necessary to determine patient infection status. The expected result is Negative.  Fact Sheet for Patients: EntrepreneurPulse.com.au  Fact Sheet for Healthcare Providers: IncredibleEmployment.be  This test is not yet approved or cleared by the [place] FDA and  has been authorized for detection and/or diagnosis of SARS-CoV-2 by FDA under an Emergency Use Authorization (EUA).  This EUA will remain in effect (meaning this test can be used) for the duration of  the COVID-19 declaration under Section 564(b)(1) of the A ct, 21 U.S.C. section 360bbb-3(b)(1), unless the authorization  is terminated or revoked sooner.     Influenza A by PCR NEGATIVE NEGATIVE Final   Influenza B by PCR NEGATIVE NEGATIVE Final    Comment: (NOTE) The Xpert Xpress SARS-CoV-2/FLU/RSV plus assay is intended as an aid in the diagnosis of influenza from Nasopharyngeal swab specimens and should not be used as a sole basis for treatment. Nasal washings and aspirates are unacceptable for Xpert Xpress SARS-CoV-2/FLU/RSV testing.  Fact Sheet for Patients: EntrepreneurPulse.com.au  Fact Sheet for Healthcare Providers: IncredibleEmployment.be  This test is not yet approved or cleared by the [place] FDA and has been authorized for detection and/or diagnosis of SARS-CoV-2 by FDA under an Emergency Use Authorization (EUA). This EUA will remain in effect (meaning this test can be used) for the duration of the COVID-19 declaration under Section 564(b)(1) of the Act, 21 U.S.C. section 360bbb-3(b)(1), unless the authorization is terminated or revoked.     Resp Syncytial Virus by PCR NEGATIVE NEGATIVE Final    Comment: (NOTE) Fact Sheet for Patients: EntrepreneurPulse.com.au  Fact Sheet for Healthcare Providers: IncredibleEmployment.be  This test is not yet approved or cleared by the [place] FDA and has been authorized for detection and/or diagnosis of SARS-CoV-2 by FDA under an Emergency Use Authorization (EUA). This EUA will remain in effect (meaning this test can be used) for the duration of the COVID-19 declaration under Section 564(b)(1) of the Act, 21 U.S.C. section 360bbb-3(b)(1), unless the authorization is terminated or revoked.  Performed at [facility], [address] [place], [place] [address]     Please note: You were cared for by a hospitalist during your hospital stay. Once you are discharged, your primary care physician will handle any further medical issues. Please note that NO  REFILLS for any discharge medications will be authorized once you are discharged, as it is imperative that you return to your primary care physician (or establish a relationship with a primary care physician if you do not have one) for your post hospital discharge needs so that they can reassess your need for medications and monitor your lab values.    Time coordinating discharge: 40 minutes  SIGNED:   Shelly Coss, MD  [facility] 02/10/2023, 10:06 AM Pager [phone]  If  7PM-7AM, please contact night-coverage [email or web address] Password [email or web address]

## 2023-02-10 NOTE — Progress Notes (Signed)
Patient ID: Donald Todd, male   DOB: [birth year], 89 y.o.   MRN: [id number] Met with the patient to review current situation; reportedly exposure to COVID when attended a funeral recently. Wife asymptomatic. Noted weight loss without intent PTA; numbness in fingertips and general weakness. Now with lack of taste and poor appetite. Reviewed medications for secondary risks and isolation continued through 02/13/23. Patient noted cough; requested cough syrup and drops but denies pain.  Continue to follow along to address educational needs to facilitate preparation for discharge home. [name]

## 2023-02-10 NOTE — H&P (Signed)
Physical Medicine and Rehabilitation Admission H&P    Chief Complaint  Patient presents with   Functional deficits due to Covid infection.     HPI:  Donald Todd is a 89 year old male with history of CAD, Renal cancer w/ intra abdominal/psoas muscle mets) s/p left nephrectomy, CKD, prostate CA, HTN, legally blind, chronic vertigo who was admitted on 02/03/23 to [facility] with 2 day hx of cough, sore throat, weakness and dizziness with poor po intake. He was found to be positive for Covid 19 virus and admitted due to concerns of hypoxia. He was started on Paxlovid and low dose decadron. He was noted to have diarrhea question due to viral illness v/s Paxlovid. Dr. Doylene Canard consulted due to concerns of bradycardia and recommended d/c of BB which has improved HR.  He was noted to have worsening of renal status therefore enalapril d/c and amlodipine added for BP control. Chronic dizziness being managed with meclizine prn. Thrombocytopenia felt to be due to viral illness and has resolved. He started developing delirium 03/06 pm requiring haldol and low dose Seroquel added to last night to help with sleep hygiene.   PT/OT has been working with patient who was noted to be limited by weakness with unsteady gait and balance deficits. CIR recommended due to functional decline.    Review of Systems  Constitutional:  Positive for malaise/fatigue. Negative for fever.  HENT:  Negative for hearing loss.   Eyes:  Positive for blurred vision.  Respiratory:  Positive for cough.   Cardiovascular:  Negative for chest pain.  Gastrointestinal:  Negative for nausea and vomiting.  Genitourinary:  Negative for dysuria.  Musculoskeletal:  Negative for myalgias.  Skin:  Negative for rash.  Neurological:  Positive for weakness.  Psychiatric/Behavioral:  Negative for depression.      Past Medical History:  Diagnosis Date   Arthritis    CAD (coronary artery disease)    last cath. 07-09-2010   Cancer [facility])     kidney cancer , prostate cancer    Chronic kidney disease    lt kidnet removed   H/O cardiovascular stress test 08-04-2010   ef 55% NO ISCHEMIA   H/O unilateral nephrectomy    lt. nephrectomy   Heart attack ([place]) 1993   History of kidney stones    Hyperlipemia    Hypertension    renal dopplers 218.13-normal patency   Inguinal hernia 08/01/11   right   Macular degeneration    Prostate cancer [facility])    Prostate disease    Cancer S/P seed implant   Renal mass    Vertigo     Past Surgical History:  Procedure Laterality Date   ANGIOPLASTY  1993   athroscopic knee surgery  2002   right   bone scan  2005   CARDIAC CATHETERIZATION  07-09-2010   normal left ventricular function, coronary obsstructive disease with 20% proximal an 30-40% mid lt. anterior descending narrowing ; widely patent stent in the proximal ramus intermediate vessel; 50-70-% narrowing in the mid circumflex,and widely patent stent  in the rt. coronary    CARDIAC CATHETERIZATION  12-22-2003   widely patent stents with noncritical coronary artery disease and normalLV function   CHOLECYSTECTOMY  05/05/2009   Laparoscopic   colonscopy  2004   with biopsy   CORONARY ANGIOPLASTY WITH STENT PLACEMENT  03-15-1996   pci to rci and ramus branch using j&j [id number] stent,post dialated  at  20atm (rca)  and 14 atm (ramus branch. ef  55%             HERNIA REPAIR  08/01/11   RIH   INGUINAL HERNIA REPAIR Left 03/29/2013   Procedure: HERNIA REPAIR INGUINAL ADULT;  Surgeon: Edward Jolly, MD;  Location: [facility];  Service: General;  Laterality: Left;   INSERTION OF MESH Left 03/29/2013   Procedure: INSERTION OF MESH;  Surgeon: Edward Jolly, MD;  Location: [facility];  Service: General;  Laterality: Left;   JOINT REPLACEMENT  2006 and 2010   left 2006, right 2010   KIDNEY SURGERY  2002 and 2005   2002 - partial removal of left. 2005 complete removal   PROSTATE BIOPSY     prostate seed implant     radiation treatment  2002    prostate   REVERSE SHOULDER ARTHROPLASTY Left 05/26/2017   Procedure: REVERSE LEFT TOTAL SHOULDER ARTHROPLASTY;  Surgeon: Netta Cedars, MD;  Location: [place];  Service: Orthopedics;  Laterality: Left;   stent implants  1997    Family History  Problem Relation Age of Onset   Cancer Mother        stomach?   Stroke Father    Stroke Brother    Skin cancer Brother     Social History:  Married. Wife w/covid/UTI currently. He  reports that he quit smoking about 59 years ago. His smoking use included cigarettes and cigars. He has a 3.25 pack-year smoking history. He has never used smokeless tobacco. He reports that he does not drink alcohol and does not use drugs.   Allergies  Allergen Reactions   Tape Hives    Surgical tape - causes blisters   Hydrocodone     Shaking uncontrollably    Iohexol      Desc: PT DOES RECALL REACTION JUST SAYS IT WAS A LONG TIME AGO FOR KIDNEY X-RAYS AND HE DIDN'T TOLERATE IT WELL-ARS 05/02/09-NOTE E-CHART STATES A HOT FEELING IS HIS REACTION, BUT HE CAN'T CONFIRM THAT WAS ALL    Oxycodone Other (See Comments)    Shaking uncontrollably     Medications Prior to Admission  Medication Sig Dispense Refill   acetaminophen (TYLENOL) 500 MG tablet Take 1,000 mg by mouth every 6 (six) hours as needed for moderate pain.     amLODipine (NORVASC) 10 MG tablet TAKE 1 TABLET DAILY 90 tablet 3   aspirin 81 MG tablet Take 81 mg by mouth at bedtime.     atorvastatin (LIPITOR) 40 MG tablet TAKE 1 TABLET AT BEDTIME 90 tablet 3   calcium carbonate (TUMS - DOSED IN MG ELEMENTAL CALCIUM) 500 MG chewable tablet Chew 1,000 mg by mouth daily as needed for indigestion or heartburn.     docusate sodium (COLACE) 100 MG capsule Take 1 capsule (100 mg total) by mouth every 12 (twelve) hours. (Patient taking differently: Take 200 mg by mouth daily.) 60 capsule 0   enalapril (VASOTEC) 10 MG tablet TAKE 1 TABLET DAILY 90 tablet 3   ipratropium (ATROVENT) 0.03 % nasal spray Place 1-2 sprays  into both nostrils 2 (two) times daily as needed (allergies).     loratadine (CLARITIN) 10 MG tablet Take 10 mg by mouth daily.     meclizine (ANTIVERT) 25 MG tablet Take 25 mg by mouth as needed for dizziness.     metoprolol succinate (TOPROL-XL) 25 MG 24 hr tablet TAKE 1 TABLET DAILY (Patient taking differently: Take 25 mg by mouth daily.) 90 tablet 3   Multiple Vitamins-Minerals (PRESERVISION AREDS 2) CAPS Take 1 capsule by  mouth 2 (two) times daily.     nitroGLYCERIN (NITROSTAT) 0.4 MG SL tablet Place 1 tablet (0.4 mg total) under the tongue as needed. 25 tablet 3   polyethylene glycol (MIRALAX / GLYCOLAX) 17 g packet Take 17 g by mouth daily. (Patient not taking: Reported on 02/03/2023) 14 each 2      Home: Home Living Family/patient expects to be discharged to:: Private residence Living Arrangements: Spouse/significant other, Children Available Help at Discharge: Available PRN/intermittently Home Access: Stairs to enter Entrance Stairs-Number of Steps: 1 Home Layout: Multi-level, Able to live on main level with bedroom/bathroom Alternate Level Stairs-Rails: Left Bathroom Shower/Tub: Multimedia programmer: Handicapped height Home Equipment: [place] - built in (3 wheeled RW) Additional Comments: wife on a rollator, unable to assist patient, daughter lives close by   Functional History: Prior Function Mobility Comments: does not drive  Functional Status:  Mobility: Bed Mobility Overal bed mobility: Needs Assistance Bed Mobility: Supine to Sit Supine to sit: Supervision, HOB elevated General bed mobility comments: HOB almost fully elevated, use of bed rails PRN Transfers Overall transfer level: Needs assistance Equipment used: Rolling walker (2 wheels) Transfers: Sit to/from Stand Sit to Stand: Supervision General transfer comment: STS from [place] with RW, pushes to stand with BUE Ambulation/Gait Ambulation/Gait assistance: Min guard, Supervision Gait  Distance (Feet): 40 Feet (+ 40 ft) Assistive device: Rolling walker (2 wheels) Gait Pattern/deviations: Decreased stride length, Decreased step length - left, Decreased step length - right General Gait Details: Pt ambulates 2 laps in room x 2 times. Gait velocity: decreased    ADL: ADL Overall ADL's : Needs assistance/impaired Eating/Feeding: Independent Grooming: Set up, Sitting Upper Body Bathing: Set up, Sitting Lower Body Bathing: Minimal assistance, Sit to/from stand Upper Body Dressing : Set up, Sitting Lower Body Dressing: Minimal assistance, Sit to/from stand Lower Body Dressing Details (indicate cue type and reason): min assist to don shoes Toilet Transfer: Minimal assistance, Regular Toilet, Rolling walker (2 wheels) Toilet Transfer Details (indicate cue type and reason): min assist to power up from toilet Toileting- Clothing Manipulation and Hygiene: Moderate assistance, Sit to/from stand Toileting - Clothing Manipulation Details (indicate cue type and reason): mod assist for toileting Functional mobility during ADLs: Minimal assistance, Rolling walker (2 wheels)  Cognition: Cognition Overall Cognitive Status: Within Functional Limits for tasks assessed Orientation Level: Oriented to person, Disoriented to place, Disoriented to time, Disoriented to situation Cognition Arousal/Alertness: Awake/alert Behavior During Therapy: WFL for tasks assessed/performed Overall Cognitive Status: Within Functional Limits for tasks assessed Area of Impairment: Orientation Orientation Level: Disoriented to, Place, Time, Situation General Comments: Follows commands throughout session, answers questions appropriately.  Physical Exam: Blood pressure 138/68, pulse 82, temperature 98.3 F (36.8 C), temperature source Oral, resp. rate 16, height '5\' 7"'$  (1.702 m), weight 62.6 kg, SpO2 99 %. Physical Exam Constitutional:      Appearance: He is ill-appearing.  HENT:     Head: Normocephalic.      Right Ear: External ear normal.     Left Ear: External ear normal.     Nose: Nose normal.     Mouth/Throat:     Mouth: Mucous membranes are moist.  Eyes:     Extraocular Movements: Extraocular movements intact.     Pupils: Pupils are equal, round, and reactive to light.     Comments: Vision poor  Cardiovascular:     Rate and Rhythm: Normal rate and regular rhythm.  Pulmonary:     Effort: Pulmonary effort is normal.  No respiratory distress.     Breath sounds: Rhonchi present.     Comments: Intermittent cough Abdominal:     General: Bowel sounds are normal.     Palpations: Abdomen is soft.  Musculoskeletal:        General: No swelling or tenderness.     Cervical back: Normal range of motion.  Skin:    General: Skin is warm.     Findings: Bruising present.  Neurological:     Mental Status: He is alert.     Comments: Alert and oriented x 3. Normal insight and awareness. Intact Memory. Normal language and speech. Cranial nerve exam unremarkable. UE 5/5 prox to distal. LE: 4- HF, KE and 4+/5 ADF/PF. Sensory exam normal for light touch and pain in all 4 limbs. No limb ataxia or cerebellar signs. No abnormal tone appreciated.    Psychiatric:        Mood and Affect: Mood normal.        Behavior: Behavior normal.     Results for orders placed or performed during the hospital encounter of 02/03/23 (from the past 48 hour(s))  Basic metabolic panel     Status: Abnormal   Collection Time: 02/09/23  5:44 AM  Result Value Ref Range   Sodium 135 135 - 145 mmol/L   Potassium 4.4 3.5 - 5.1 mmol/L   Chloride 102 98 - 111 mmol/L   CO2 26 22 - 32 mmol/L   Glucose, Bld 156 (H) 70 - 99 mg/dL    Comment: Glucose reference range applies only to samples taken after fasting for at least 8 hours.   BUN 45 (H) 8 - 23 mg/dL   Creatinine, Ser 1.55 (H) 0.61 - 1.24 mg/dL   Calcium 8.4 (L) 8.9 - 10.3 mg/dL   GFR, Estimated 42 (L) >60 mL/min    Comment: (NOTE) Calculated using the CKD-EPI Creatinine  Equation (2021)    Anion gap 7 5 - 15    Comment: Performed at [facility], [place] [address], [place], [place] [id number]  Basic metabolic panel     Status: Abnormal   Collection Time: 02/10/23  6:07 AM  Result Value Ref Range   Sodium 132 (L) 135 - 145 mmol/L   Potassium 4.6 3.5 - 5.1 mmol/L   Chloride 102 98 - 111 mmol/L   CO2 25 22 - 32 mmol/L   Glucose, Bld 125 (H) 70 - 99 mg/dL    Comment: Glucose reference range applies only to samples taken after fasting for at least 8 hours.   BUN 43 (H) 8 - 23 mg/dL   Creatinine, Ser 1.40 (H) 0.61 - 1.24 mg/dL   Calcium 8.2 (L) 8.9 - 10.3 mg/dL   GFR, Estimated 47 (L) >60 mL/min    Comment: (NOTE) Calculated using the CKD-EPI Creatinine Equation (2021)    Anion gap 5 5 - 15    Comment: Performed at [facility], [place] [address] [place], [place] [address]   No results found.    Blood pressure 138/68, pulse 82, temperature 98.3 F (36.8 C), temperature source Oral, resp. rate 16, height '5\' 7"'$  (1.702 m), weight 62.6 kg, SpO2 99 %.  Medical Problem List and Plan: 1. Functional deficits secondary to debility related COVID and multiple medical issues  -patient may shower  -ELOS/Goals: 10-12 days, supervision to mod I goals with PT, OT 2.  Antithrombotics: -DVT/anticoagulation:  Pharmaceutical: Lovenox--discussed with pt the need for lovenox injections today.   -antiplatelet therapy: ASA.  3. Pain Management: Tylenol prn 4. Mood/Behavior/Sleep: LCSW to follow for evaluation and suppor.t   -antipsychotic agents: continue Seroquel at bedtime.  5. Neuropsych/cognition: This patient is capable of making decisions on his own behalf. 6. Skin/Wound Care: Routine pressure relief measures.  7. Fluids/Electrolytes/Nutrition: Monitor I/O. Check CMET in am  --Ensure added for malnutrition.  8. Covid PNA: Has completed Paxlovid/decadron to complete tomorrow. Respiratory precautions thru 03/11.   --pulmonary  hygiene with flutter valve.  9. Delirium w/agitation/hallucinations: Monitor sleep/wake cycle. Continue Seroquel.  10. Hyponatremia: Recheck Na level in am 11. Acute on CKD: SCr was 1.4. Has had left nephrectomy for CA.   --has had worsening of renal status BUN/Scr 22/1.45-->45/1.55 --Encourage fluids. Recheck in am.   12.  Anemia/thrombocytopenia: admission Hgb/Plt-11.2/91 -->12.2/151  --Likely improved due to hemoconcentration. Recheck in am.  77. CAD s/p PTCA: On ASA and Lipitor. BB was d/c due to bradycardia. '  --has been off enalapril due to acute on chronic renal failure.  14. Chronic dizziness/Legally blind (macula degeneration): Meclizine prn. --Fall precautions.    Bary Leriche, PA-C 02/10/2023

## 2023-02-10 NOTE — TOC Transition Note (Addendum)
Transition of Care [facility]) - CM/SW Discharge Note   Patient Details  Name: Donald Todd MRN: [id number] Date of Birth: 02-May-1929  [facility] CM/SW Contact:  Henrietta Dine, RN Phone Number: 02/10/2023, 11:09 AM   Clinical Narrative:    D/C order received; pt to d/c to CIR today; LVM for Mariel Craft, Admission Coordinate to obtain RM # and call report #; awaiting return call;spoke w/ pt's son and he agrees to d/c plan; pt will be transported by [facility]; she will call to arrange transport; no TOC needs.  -[phone]- return call from Shann Medal; she gave RM # [phone]; she says call report # was previously given to pt's nurse; no TOC needs.   Final next level of care: [place] (CIR) Barriers to Discharge: No Barriers Identified   Patient Goals and CMS Choice CMS Medicare.gov Compare Post Acute Care list provided to:: Patient Choice offered to / list presented to : Patient, Spouse  Discharge Placement                      Patient and family notified of of transfer: 02/10/23  Discharge Plan and Services Additional resources added to the After Visit Summary for   In-house Referral: Clinical Social Work Discharge Planning Services: NA Post Acute Care Choice: [place]          DME Arranged: N/A DME Agency: NA                  Social Determinants of Health (SDOH) Interventions SDOH Screenings   Food Insecurity: No Food Insecurity (02/03/2023)  Housing: Low Risk  (02/03/2023)  Transportation Needs: No Transportation Needs (02/03/2023)  Utilities: Not At Risk (02/03/2023)  Tobacco Use: Medium Risk (02/03/2023)     Readmission Risk Interventions     No data to display

## 2023-02-11 DIAGNOSIS — R5381 Other malaise: Secondary | ICD-10-CM | POA: Diagnosis not present

## 2023-02-11 DIAGNOSIS — N179 Acute kidney failure, unspecified: Secondary | ICD-10-CM | POA: Diagnosis not present

## 2023-02-11 DIAGNOSIS — D649 Anemia, unspecified: Secondary | ICD-10-CM | POA: Diagnosis not present

## 2023-02-11 LAB — CBC WITH DIFFERENTIAL/PLATELET
Abs Immature Granulocytes: 0.06 10*3/uL (ref 0.00–0.07)
Basophils Absolute: 0 10*3/uL (ref 0.0–0.1)
Basophils Relative: 0 %
Eosinophils Absolute: 0 10*3/uL (ref 0.0–0.5)
Eosinophils Relative: 0 %
HCT: 35.4 % — ABNORMAL LOW (ref 39.0–52.0)
Hemoglobin: 11.8 g/dL — ABNORMAL LOW (ref 13.0–17.0)
Immature Granulocytes: 1 %
Lymphocytes Relative: 16 %
Lymphs Abs: 0.7 10*3/uL (ref 0.7–4.0)
MCH: 30.3 pg (ref 26.0–34.0)
MCHC: 33.3 g/dL (ref 30.0–36.0)
MCV: 91 fL (ref 80.0–100.0)
Monocytes Absolute: 0.6 10*3/uL (ref 0.1–1.0)
Monocytes Relative: 14 %
Neutro Abs: 3 10*3/uL (ref 1.7–7.7)
Neutrophils Relative %: 69 %
Platelets: 163 10*3/uL (ref 150–400)
RBC: 3.89 MIL/uL — ABNORMAL LOW (ref 4.22–5.81)
RDW: 12.6 % (ref 11.5–15.5)
WBC: 4.4 10*3/uL (ref 4.0–10.5)
nRBC: 0 % (ref 0.0–0.2)

## 2023-02-11 LAB — COMPREHENSIVE METABOLIC PANEL
ALT: 70 U/L — ABNORMAL HIGH (ref 0–44)
AST: 32 U/L (ref 15–41)
Albumin: 2.6 g/dL — ABNORMAL LOW (ref 3.5–5.0)
Alkaline Phosphatase: 52 U/L (ref 38–126)
Anion gap: 11 (ref 5–15)
BUN: 36 mg/dL — ABNORMAL HIGH (ref 8–23)
CO2: 22 mmol/L (ref 22–32)
Calcium: 8.2 mg/dL — ABNORMAL LOW (ref 8.9–10.3)
Chloride: 103 mmol/L (ref 98–111)
Creatinine, Ser: 1.27 mg/dL — ABNORMAL HIGH (ref 0.61–1.24)
GFR, Estimated: 53 mL/min — ABNORMAL LOW (ref >60)
Glucose, Bld: 97 mg/dL (ref 70–99)
Potassium: 4.4 mmol/L (ref 3.5–5.1)
Sodium: 136 mmol/L (ref 135–145)
Total Bilirubin: 0.9 mg/dL (ref 0.3–1.2)
Total Protein: 5 g/dL — ABNORMAL LOW (ref 6.5–8.1)

## 2023-02-11 MED ORDER — GUAIFENESIN-DM 100-10 MG/5ML PO SYRP: 5.0000 mL | ORAL_SOLUTION | ORAL | Status: DC | PRN

## 2023-02-11 NOTE — Evaluation (Signed)
Physical Therapy Assessment and Plan  Patient Details  Name: Donald Todd MRN: [id number] Date of Birth: 04/29/1929  PT Diagnosis: Abnormal posture, Abnormality of gait, Dizziness and giddiness, Impaired sensation, Muscle weakness, and Vertigo Rehab Potential: Good ELOS: 7-10 days   Today's Date: 02/11/2023 PT Individual Time:  -       Hospital Problem: Principal Problem:   Debility   Past Medical History:  Past Medical History:  Diagnosis Date   Arthritis    CAD (coronary artery disease)    last cath. 07-09-2010   Cancer [facility])    kidney cancer , prostate cancer    Chronic kidney disease    lt kidnet removed   H/O cardiovascular stress test 08-04-2010   ef 55% NO ISCHEMIA   H/O unilateral nephrectomy    lt. nephrectomy   Heart attack ([place]) 1993   History of kidney stones    Hyperlipemia    Hypertension    renal dopplers 218.13-normal patency   Inguinal hernia 08/01/11   right   Macular degeneration    Prostate cancer [facility])    Prostate disease    Cancer S/P seed implant   Renal mass    Vertigo    Past Surgical History:  Past Surgical History:  Procedure Laterality Date   ANGIOPLASTY  1993   athroscopic knee surgery  2002   right   bone scan  2005   CARDIAC CATHETERIZATION  07-09-2010   normal left ventricular function, coronary obsstructive disease with 20% proximal an 30-40% mid lt. anterior descending narrowing ; widely patent stent in the proximal ramus intermediate vessel; 50-70-% narrowing in the mid circumflex,and widely patent stent  in the rt. coronary    CARDIAC CATHETERIZATION  12-22-2003   widely patent stents with noncritical coronary artery disease and normalLV function   CHOLECYSTECTOMY  05/05/2009   Laparoscopic   colonscopy  2004   with biopsy   CORONARY ANGIOPLASTY WITH STENT PLACEMENT  03-15-1996   pci to rci and ramus branch using j&j [id number] stent,post dialated  at  20atm (rca)  and 14 atm (ramus branch. ef 55%             HERNIA REPAIR  08/01/11    RIH   INGUINAL HERNIA REPAIR Left 03/29/2013   Procedure: HERNIA REPAIR INGUINAL ADULT;  Surgeon: Edward Jolly, MD;  Location: [facility];  Service: General;  Laterality: Left;   INSERTION OF MESH Left 03/29/2013   Procedure: INSERTION OF MESH;  Surgeon: Edward Jolly, MD;  Location: [facility];  Service: General;  Laterality: Left;   JOINT REPLACEMENT  2006 and 2010   left 2006, right 2010   KIDNEY SURGERY  2002 and 2005   2002 - partial removal of left. 2005 complete removal   PROSTATE BIOPSY     prostate seed implant     radiation treatment  2002   prostate   REVERSE SHOULDER ARTHROPLASTY Left 05/26/2017   Procedure: REVERSE LEFT TOTAL SHOULDER ARTHROPLASTY;  Surgeon: Netta Cedars, MD;  Location: [place];  Service: Orthopedics;  Laterality: Left;   stent implants  1997    Assessment & Plan Clinical Impression: Patient is a 89 y.o. 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 He was found to be positive for Covid  19 virus and admitted due to concerns of hypoxia. He was started on Paxlovid and low dose decadron. He was noted to have diarrhea question due to viral illness v/s Paxlovid. Dr. Doylene Canard consulted due to concerns of bradycardia and recommended d/c of BB which has improved HR.  He was noted to have worsening of renal status therefore enalapril d/c and amlodipine added for BP control. Chronic dizziness being managed with meclizine prn. Thrombocytopenia felt to be due to viral illness and has resolved. He started developing delirium 03/06 pm requiring haldol and low dose Seroquel added to last night to help with sleep hygiene.   PT/OT has been working with patient who was noted to be limited by weakness with unsteady gait and balance deficits. CIR  recommended due to functional decline.   Patient transferred to CIR on 02/10/2023 .   Patient currently requires min with mobility secondary to muscle weakness, decreased visual acuity, vertigo, and decreased standing balance, decreased postural control, and endurance .  Prior to hospitalization, patient was modified independent  with mobility and lived with Spouse in a House home.  Home access is 1 in the garage,1 from the side and 1 in the frontStairs to enter.  Patient will benefit from skilled PT intervention to maximize safe functional mobility, minimize fall risk, and decrease caregiver burden for planned discharge home with intermittent assist.  Anticipate patient will benefit from follow up [facility] at discharge.  PT - End of Session Activity Tolerance: Tolerates 10 - 20 min activity with multiple rests Endurance Deficit: Yes PT Assessment Rehab Potential (ACUTE/IP ONLY): Good PT Barriers to Discharge: [place] home environment;Decreased caregiver support;Home environment access/layout;Lack of/limited family support PT Barriers to Discharge Comments: Pt livesw/ spouse who is unable to physically take care of pt PT Patient demonstrates impairments in the following area(s): Balance;Sensory;Endurance;Motor PT Transfers Functional Problem(s): Bed Mobility;Bed to Chair;Car;Furniture PT Locomotion Functional Problem(s): Ambulation;Stairs PT Plan PT Intensity: Minimum of 1-2 x/day ,45 to 90 minutes PT Frequency: Total of 15 hours over 7 days of combined therapies PT Duration Estimated Length of Stay: 7-10 days PT Treatment/Interventions: Ambulation/gait training;Community reintegration;DME/adaptive equipment instruction;Neuromuscular re-education;Psychosocial support;Stair training;UE/LE Strength taining/ROM;Wheelchair propulsion/positioning;Balance/vestibular training;Discharge planning;Functional electrical stimulation;Pain management;Skin care/wound management;Therapeutic Activities;UE/LE  Coordination activities;Cognitive remediation/compensation;Disease management/prevention;Functional mobility training;Patient/family education;Splinting/orthotics;Visual/perceptual remediation/compensation;Therapeutic Exercise PT Transfers Anticipated Outcome(s): Mod I PT Locomotion Anticipated Outcome(s): Supervision PT Recommendation Recommendations for Other Services: Therapeutic Recreation consult Therapeutic Recreation Interventions: Pet therapy;Kitchen group;Stress management;Outing/community reintergration Follow Up Recommendations: Home health PT Patient destination: Home Equipment Recommended: To be determined   PT Evaluation Precautions/Restrictions Precautions Precautions: Fall Restrictions Weight Bearing Restrictions: No General   Vital Signs Pain Pain Assessment Pain Scale: 0-10 Pain Score: 0-No pain Pain Interference Pain Interference Pain Effect on Sleep: 0. Does not apply - I have not had any pain or hurting in the past 5 days Pain Interference with Therapy Activities: 0. Does not apply - I have not received rehabilitationtherapy in the past 5 days Pain Interference with Day-to-Day Activities: 1. Rarely or not at all Home Living/Prior [place] Available Help at Discharge: Family Type of Home: House Home Access: Stairs to enter [employer] of Steps: 1 in the garage,1 from the side and 1 in the front Entrance Stairs-Rails: Left Home Layout: One level Bathroom Shower/Tub: Walk-in shower;Door [employer] Toilet: Handicapped height Additional Comments: wife on a rollator, unable to assist patient, daughter lives close by  Lives With: Spouse Prior Function Level of Independence: Requires assistive device for independence  Able to Take Stairs?: Yes Driving:  No Vision/Perception  Vision - History Ability to See in Adequate Light: 1 Impaired Vision - Assessment Eye Alignment: Within Functional Limits Ocular Range of Motion: Within  Functional Limits Tracking/Visual Pursuits: Decreased smoothness of horizontal tracking;Decreased smoothness of vertical tracking Saccades: Decreased speed of saccadic movement Additional Comments: feels like vision has gotten worse Perception Perception: Within Functional Limits Praxis Praxis: Intact  Cognition Overall Cognitive Status: Within Functional Limits for tasks assessed Arousal/Alertness: Awake/alert Memory: Impaired Awareness: Appears intact Safety/Judgment: Appears intact Sensation Sensation Light Touch: Impaired Detail Peripheral sensation comments: decreased sensation in distal fingers impaired B Proprioception: Appears Intact Coordination Gross Motor Movements are Fluid and Coordinated: No Fine Motor Movements are Fluid and Coordinated: Yes Coordination and Movement Description: L shoulder replacement in past, R shoulder severly impaired functional reach Finger Nose Finger Test: [facility] Motor  Motor Motor: Within Functional Limits   Trunk/Postural Assessment  Cervical Assessment Cervical Assessment: Exceptions to [facility] (forward head) Thoracic Assessment Thoracic Assessment: Exceptions to [facility] (kyphotic) Lumbar Assessment Lumbar Assessment: Exceptions to [facility] (posterior pelvic tilt) Postural Control Postural Control: Deficits on evaluation  Balance Balance Balance Assessed: Yes Static Sitting Balance Static Sitting - Balance Support: Feet supported Static Sitting - Level of Assistance: 6: Modified independent (Device/Increase time) Dynamic Sitting Balance Dynamic Sitting - Balance Support: During functional activity Dynamic Sitting - Level of Assistance: 5: Stand by assistance Static Standing Balance Static Standing - Balance Support: During functional activity Static Standing - Level of Assistance: 5: Stand by assistance Dynamic Standing Balance Dynamic Standing - Level of Assistance: 4: Min assist Extremity Assessment  RUE Assessment RUE Assessment:  Exceptions to [facility] General Strength Comments: FF 0-30, abd 0-40 full elbow wrist and digit LUE Assessment General Strength Comments: FF 0-90 RLE Assessment RLE Assessment: Exceptions to [facility] RLE Strength Right Hip Flexion: 3/5 Right Hip ABduction: 4/5 Right Hip ADduction: 4/5 Right Knee Flexion: 4/5 Right Knee Extension: 4/5 Right Ankle Dorsiflexion: 4/5 LLE Assessment LLE Assessment: Exceptions to [facility] LLE Strength Left Hip Flexion: 3/5 Left Hip ABduction: 4/5 Left Hip ADduction: 4/5 Left Knee Flexion: 4/5 Left Knee Extension: 4/5 Left Ankle Dorsiflexion: 3/5  Care Tool Care Tool Bed Mobility Roll left and right activity   Roll left and right assist level: Independent with assistive device    Sit to lying activity   Sit to lying assist level: Independent with assistive device    Lying to sitting on side of bed activity   Lying to sitting on side of bed assist level: the ability to move from lying on the back to sitting on the side of the bed with no back support.: Independent with assistive device     Care Tool Transfers Sit to stand transfer   Sit to stand assist level: Contact Guard/Touching assist    Chair/bed transfer   Chair/bed transfer assist level: Minimal Assistance - Patient > 75%     Toilet transfer   Assist Level: Minimal Assistance - Patient > 75%    Car transfer Car transfer activity did not occur: Safety/medical concerns        Care Tool Locomotion Ambulation   Assist level: Minimal Assistance - Patient > 75%   Max distance: 111f  Walk 10 feet activity   Assist level: Minimal Assistance - Patient > 75% Assistive device: Walker-rolling   Walk 50 feet with 2 turns activity   Assist level: Minimal Assistance - Patient > 75% Assistive device: Walker-rolling  Walk 150 feet activity   Assist level: Minimal Assistance - Patient > 75%  Assistive device: Walker-rolling  Walk 10 feet on uneven surfaces activity Walk 10 feet on uneven surfaces  activity did not occur: Safety/medical concerns      Stairs Stair activity did not occur: Safety/medical concerns        Walk up/down 1 step activity Walk up/down 1 step or curb (drop down) activity did not occur: Safety/medical concerns      Walk up/down 4 steps activity Walk up/down 4 steps activity did not occur: Safety/medical concerns      Walk up/down 12 steps activity Walk up/down 12 steps activity did not occur: Safety/medical concerns      Pick up small objects from floor Pick up small object from the floor (from standing position) activity did not occur: Safety/medical concerns      Wheelchair Is the patient using a wheelchair?: No          Wheel 50 feet with 2 turns activity      Wheel 150 feet activity        Refer to Care Plan for Long Term Goals  SHORT TERM GOAL WEEK 1 PT Short Term Goal 1 (Week 1): STG=LTG  Recommendations for other services: Therapeutic Recreation  Pet therapy, Kitchen group, Stress management, and Outing/community reintegration  Skilled Therapeutic Intervention Mobility Bed Mobility Bed Mobility: Supine to Sit;Sitting - Scoot to Edge of Bed Supine to Sit: Supervision/Verbal cueing Sitting - Scoot to [employer] of Bed: Supervision/Verbal cueing Transfers Transfers: Sit to Stand;Stand to Sit;Stand Pivot Transfers Sit to Stand: Minimal Assistance - Patient > 75% Stand to Sit: Minimal Assistance - Patient > 75% Stand Pivot Transfers: Minimal Assistance - Patient > 75% Stand Pivot Transfer Details: Verbal cues for technique;Verbal cues for safe use of DME/AE;Verbal cues for sequencing Transfer (Assistive device): Rolling walker Locomotion  Gait Ambulation: Yes Gait Assistance: Minimal Assistance - Patient > 75% Gait Distance (Feet): 100 Feet Assistive device: Rolling walker Gait Assistance Details: Verbal cues for safe use of DME/AE;Verbal cues for sequencing;Verbal cues for gait pattern Gait Gait: Yes Gait Pattern: Impaired Gait  velocity: decreased Stairs / Additional Locomotion Stairs: No Wheelchair Mobility Wheelchair Mobility: No  Pt received supine in bed and agreeable to PT services. New pt evaluation assessed as noted above. Pt currently positive for COVID-19 and on air/contact precautions. Pt remained in room for the entirety of the session.   Pt is legally blind in the R eye and has expressed that the L has gotten more blurry since hospital admission. Distal IP sensation seemingly impaired to light touch B. Pt performed bed mobility w/ supervision. Sit<>stand performed w/ CGA. Pt cued to scoot closer to EOB to assist in transfer. Pt expressed moderate dizziness, however, expresses that is "normal" for him. Pt does have a hx of vertigo which could be contributing to pt's dizziness. Pt performed 5xSTS from Bed level in 25.40 seconds, <15 is indicative of an individual at risk of increase falls. Pt performed ~179f of gait in room w/ RW and Min A for stability and RW management. Pt required mod vc to keep walker closer to him to avoid LOB and falls. Pt brief changed bedside w/ pt standing. Pt was able to tolerate standing for ~3 minutes w/ UE support. Pt dependent for pericare. Pt returned to supine w/ supervision A and was left w/ bed alarm on, call bell in reach and all needs met.    Discharge Criteria: Patient will be discharged from PT if patient refuses treatment 3 consecutive times without medical reason, if treatment goals  not met, if there is a change in medical status, if patient makes no progress towards goals or if patient is discharged from hospital.  The above assessment, treatment plan, treatment alternatives and goals were discussed and mutually agreed upon: by patient  Pattie Flaharty 02/11/2023, 1:19 PM

## 2023-02-11 NOTE — Plan of Care (Signed)
  Problem: RH Balance Goal: LTG Patient will maintain dynamic sitting balance (PT) Description: LTG:  Patient will maintain dynamic sitting balance with assistance during mobility activities (PT) Flowsheets (Taken 02/11/2023 1311) LTG: Pt will maintain dynamic sitting balance during mobility activities with:: Independent with assistive device  Goal: LTG Patient will maintain dynamic standing balance (PT) Description: LTG:  Patient will maintain dynamic standing balance with assistance during mobility activities (PT) Flowsheets (Taken 02/11/2023 1311) LTG: Pt will maintain dynamic standing balance during mobility activities with:: Supervision/Verbal cueing   Problem: Sit to Stand Goal: LTG:  Patient will perform sit to stand with assistance level (PT) Description: LTG:  Patient will perform sit to stand with assistance level (PT) Flowsheets (Taken 02/11/2023 1311) LTG: PT will perform sit to stand in preparation for functional mobility with assistance level: Supervision/Verbal cueing   Problem: RH Bed Mobility Goal: LTG Patient will perform bed mobility with assist (PT) Description: LTG: Patient will perform bed mobility with assistance, with/without cues (PT). Flowsheets (Taken 02/11/2023 1311) LTG: Pt will perform bed mobility with assistance level of: Independent with assistive device    Problem: RH Bed to Chair Transfers Goal: LTG Patient will perform bed/chair transfers w/assist (PT) Description: LTG: Patient will perform bed to chair transfers with assistance (PT). Flowsheets (Taken 02/11/2023 1311) LTG: Pt will perform Bed to Chair Transfers with assistance level: Independent with assistive device    Problem: RH Car Transfers Goal: LTG Patient will perform car transfers with assist (PT) Description: LTG: Patient will perform car transfers with assistance (PT). Flowsheets (Taken 02/11/2023 1311) LTG: Pt will perform car transfers with assist:: Set up assist    Problem: RH Furniture  Transfers Goal: LTG Patient will perform furniture transfers w/assist (OT/PT) Description: LTG: Patient will perform furniture transfers  with assistance (OT/PT). Flowsheets (Taken 02/11/2023 1311) LTG: Pt will perform furniture transfers with assist:: Independent with assistive device    Problem: RH Ambulation Goal: LTG Patient will ambulate in controlled environment (PT) Description: LTG: Patient will ambulate in a controlled environment, # of feet with assistance (PT). Flowsheets (Taken 02/11/2023 1311) LTG: Pt will ambulate in controlled environ  assist needed:: Supervision/Verbal cueing LTG: Ambulation distance in controlled environment: 259ft Goal: LTG Patient will ambulate in home environment (PT) Description: LTG: Patient will ambulate in home environment, # of feet with assistance (PT). Flowsheets (Taken 02/11/2023 1311) LTG: Pt will ambulate in home environ  assist needed:: Supervision/Verbal cueing Goal: LTG Patient will ambulate in community environment (PT) Description: LTG: Patient will ambulate in community environment, # of feet with assistance (PT). Flowsheets (Taken 02/11/2023 1311) LTG: Pt will ambulate in community environ  assist needed:: Supervision/Verbal cueing   Problem: RH Stairs Goal: LTG Patient will ambulate up and down stairs w/assist (PT) Description: LTG: Patient will ambulate up and down # of stairs with assistance (PT) Flowsheets (Taken 02/11/2023 1311) LTG: Pt will ambulate up/down stairs assist needed:: Supervision/Verbal cueing

## 2023-02-11 NOTE — Evaluation (Signed)
Occupational Therapy Assessment and Plan  Patient Details  Name: Donald Todd MRN: [id number] Date of Birth: [birth year]  OT Diagnosis: abnormal posture and muscle weakness (generalized) Rehab Potential: Rehab Potential (ACUTE ONLY): Good ELOS: 7-10   Today's Date: 02/11/2023 OT Individual Time: 1100-1200 OT Individual Time Calculation (min): 60 min     Hospital Problem: Principal Problem:   Debility   Past Medical History:  Past Medical History:  Diagnosis Date   Arthritis    CAD (coronary artery disease)    last cath. 07-09-2010   Cancer [facility])    kidney cancer , prostate cancer    Chronic kidney disease    lt kidnet removed   H/O cardiovascular stress test 08-04-2010   ef 55% NO ISCHEMIA   H/O unilateral nephrectomy    lt. nephrectomy   Heart attack ([place]) 1993   History of kidney stones    Hyperlipemia    Hypertension    renal dopplers 218.13-normal patency   Inguinal hernia 08/01/11   right   Macular degeneration    Prostate cancer [facility])    Prostate disease    Cancer S/P seed implant   Renal mass    Vertigo    Past Surgical History:  Past Surgical History:  Procedure Laterality Date   ANGIOPLASTY  1993   athroscopic knee surgery  2002   right   bone scan  2005   CARDIAC CATHETERIZATION  07-09-2010   normal left ventricular function, coronary obsstructive disease with 20% proximal an 30-40% mid lt. anterior descending narrowing ; widely patent stent in the proximal ramus intermediate vessel; 50-70-% narrowing in the mid circumflex,and widely patent stent  in the rt. coronary    CARDIAC CATHETERIZATION  12-22-2003   widely patent stents with noncritical coronary artery disease and normalLV function   CHOLECYSTECTOMY  05/05/2009   Laparoscopic   colonscopy  2004   with biopsy   CORONARY ANGIOPLASTY WITH STENT PLACEMENT  03-15-1996   pci to rci and ramus branch using j&j [id number] stent,post dialated  at  20atm (rca)  and 14 atm (ramus branch. ef 55%              HERNIA REPAIR  08/01/11   RIH   INGUINAL HERNIA REPAIR Left 03/29/2013   Procedure: HERNIA REPAIR INGUINAL ADULT;  Surgeon: Edward Jolly, MD;  Location: [facility];  Service: General;  Laterality: Left;   INSERTION OF MESH Left 03/29/2013   Procedure: INSERTION OF MESH;  Surgeon: Edward Jolly, MD;  Location: [facility];  Service: General;  Laterality: Left;   JOINT REPLACEMENT  2006 and 2010   left 2006, right 2010   KIDNEY SURGERY  2002 and 2005   2002 - partial removal of left. 2005 complete removal   PROSTATE BIOPSY     prostate seed implant     radiation treatment  2002   prostate   REVERSE SHOULDER ARTHROPLASTY Left 05/26/2017   Procedure: REVERSE LEFT TOTAL SHOULDER ARTHROPLASTY;  Surgeon: Netta Cedars, MD;  Location: [place];  Service: Orthopedics;  Laterality: Left;   stent implants  1997    Assessment & Plan Clinical Impression:  Donald Todd is a 89 y.o. 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  Prior to hospitalization, patient could complete BADL/IADL with modified independent .  Patient will benefit from skilled intervention to decrease level of assist with basic self-care skills and increase independence with basic self-care skills prior to discharge home with care partner.  Anticipate patient will require 24 hour supervision and follow up home health.  OT  Assessment Rehab Potential (ACUTE ONLY): Good OT Barriers to Discharge: Decreased caregiver support OT Patient demonstrates impairments in the following area(s): Balance;Endurance;Safety;Vision OT Basic ADL's Functional Problem(s): Grooming;Bathing;Dressing;Toileting OT Advanced ADL's Functional Problem(s): Simple Meal Preparation OT Transfers Functional Problem(s): Toilet;Tub/Shower OT Plan OT Intensity: Minimum of 1-2 x/day, 45 to 90 minutes OT Frequency: 5 out of 7 days OT Duration/Estimated Length of Stay: 7-10 OT Treatment/Interventions: Balance/vestibular training;DME/adaptive equipment instruction;Patient/family education;Therapeutic Activities;Wheelchair propulsion/positioning;Therapeutic Exercise;Psychosocial support;Functional electrical stimulation;Community reintegration;Functional mobility training;Self Care/advanced ADL retraining;UE/LE Strength taining/ROM;UE/LE Coordination activities;Skin care/wound managment;Neuromuscular re-education;Discharge planning;Disease mangement/prevention;Pain management;Splinting/orthotics;Visual/perceptual remediation/compensation OT Self Feeding Anticipated Outcome(s): S OT Basic Self-Care Anticipated Outcome(s): S OT Toileting Anticipated Outcome(s): S OT Bathroom Transfers Anticipated Outcome(s): S OT Recommendation Patient destination: Home Follow Up Recommendations: Home health OT Equipment Recommended: None recommended by OT   OT Evaluation Precautions/Restrictions  Precautions Precautions: Fall Restrictions Weight Bearing Restrictions: No General Chart Reviewed: Yes Family/Caregiver Present: Yes Vital Signs  Pain Pain Assessment Pain Score: 0-No pain Home Living/Prior Functioning Home Living Available Help at Discharge: Family Type of Home: House Home Access: Stairs to enter [employer] of Steps: 1 in the garage,1 from the side and 1 in the front Entrance Stairs-Rails: Left Home Layout: One level Bathroom  Shower/Tub: Gaffer, Charity fundraiser: Handicapped height Additional Comments: wife on a rollator, unable to assist patient, daughter lives close by  Lives With: Spouse IADL History Homemaking Responsibilities: Yes Meal Prep Responsibility: Secondary Laundry Responsibility: Secondary Cleaning Responsibility: Secondary Bill Paying/Finance Responsibility: Secondary Shopping Responsibility: Secondary Current License: No Prior Function Level of Independence: Requires assistive device for independence  Able to Take Stairs?: Yes Driving: No Vision Baseline Vision/History: 1 Wears glasses;2 Legally blind Ability to See in Adequate Light: 1 Impaired Patient Visual Report: Blurring of vision Additional Comments: feels like vision is worse Perception  Perception: Within Functional Limits Praxis Praxis: Intact Cognition Cognition Overall Cognitive Status: Within Functional Limits for tasks assessed Arousal/Alertness: Awake/alert Orientation Level: Person;Place;Situation Person: Oriented Place: Oriented Memory: Impaired Awareness: Appears intact Safety/Judgment: Appears intact Brief Interview for Mental Status (BIMS) Repetition of Three Words (First Attempt): 3 Temporal Orientation: Year: Correct Temporal Orientation: Month: Accurate within 5 days Temporal Orientation: Day: Correct Recall: "Sock": Yes, no cue required Recall: "Blue": Yes, no cue required Recall: "Bed": No, could not recall (can recall in field of 2) BIMS Summary Score: 13 Sensation Sensation Light Touch: Appears Intact Proprioception: Appears Intact Coordination Gross Motor Movements are Fluid and Coordinated: No Fine Motor Movements are Fluid and Coordinated: Yes Coordination and Movement Description: L shoulder replacement in past, R shoulder severly impaired functional reach Finger Nose Finger Test: [facility] Motor  Motor Motor: Within Functional Limits  Trunk/Postural Assessment  Cervical  Assessment Cervical Assessment:  (head forward) Thoracic Assessment Thoracic Assessment:  (roudne shoulders) Lumbar Assessment Lumbar Assessment:  (post pelvic tilt) Postural Control Postural Control: Deficits on evaluation  Balance   Extremity/Trunk Assessment RUE Assessment RUE Assessment: Exceptions to [facility] General Strength Comments: FF  0-30, abd 0-40 full elbow wrist and digit LUE Assessment General Strength Comments: FF 0-90  Care Tool Care Tool Self Care Eating        Oral Care    Oral Care Assist Level: Minimal Assistance - Patient > 75%    Bathing   Body parts bathed by patient: Right arm;Left arm;Chest;Abdomen;Front perineal area;Buttocks;Right upper leg;Left upper leg;Right lower leg;Left lower leg;Face     Assist Level: Minimal Assistance - Patient > 75%    Upper Body Dressing(including orthotics)   What is the patient wearing?: Pull over shirt   Assist Level: Minimal Assistance - Patient > 75%    Lower Body Dressing (excluding footwear)   What is the patient wearing?: Pants;Underwear/pull up Assist for lower body dressing: Maximal Assistance - Patient 25 - 49%    Putting on/Taking off footwear   What is the patient wearing?: Socks;Shoes Assist for footwear: Moderate Assistance - Patient 50 - 74%       Care Tool Toileting Toileting activity   Assist for toileting: Minimal Assistance - Patient > 75%     Care Tool Bed Mobility Roll left and right activity        Sit to lying activity        Lying to sitting on side of bed activity         Care Tool Transfers Sit to stand transfer        Chair/bed transfer         Toilet transfer   Assist Level: Minimal Assistance - Patient > 75%     Care Tool Cognition  Expression of Ideas and Wants    Understanding Verbal and Non-Verbal Content     Memory/Recall Ability     Refer to Care Plan for Long Term Goals  SHORT TERM GOAL WEEK 1 OT Short Term Goal 1 (Week 1): STG=LTG d/t  ELOS  Recommendations for other services: None    Skilled Therapeutic Intervention ADL ADL Grooming: Minimal assistance Where Assessed-Grooming: Standing at sink Upper Body Bathing: Supervision/safety Where Assessed-Upper Body Bathing: Edge of bed Lower Body Bathing: Minimal assistance Where Assessed-Lower Body Bathing: Edge of bed Upper Body Dressing: Minimal assistance Where Assessed-Upper Body Dressing: Edge of bed Lower Body Dressing: Maximal assistance Where Assessed-Lower Body Dressing: Edge of bed Toileting: Minimal assistance Where Assessed-Toileting: Glass blower/designer: Psychiatric nurse Method: Ambulating Mobility  Transfers Sit to Stand: Minimal Assistance - Patient > 75% Stand to Sit: Minimal Assistance - Patient > 75%   Discharge Criteria: Patient will be discharged from OT if patient refuses treatment 3 consecutive times without medical reason, if treatment goals not met, if there is a change in medical status, if patient makes no progress towards goals or if patient is discharged from hospital.  The above assessment, treatment plan, treatment alternatives and goals were discussed and mutually agreed upon: by patient  Tonny Branch 02/11/2023, 11:50 AM

## 2023-02-11 NOTE — Progress Notes (Signed)
PROGRESS NOTE   Subjective/Complaints: Denies any issues today, slept ok, not having pain, not sure of his LBM but it's documented as ?02/10/23 maybe and then had one after our eval this morning. Urinating fine. No other complaints or concerns today.   ROS: Denies fevers, chills, CP, SOB, abd pain, N/V/D/C, new/worsening paresthesias/weakness, or any other complaints at this time.     Objective:   No results found. Recent Labs    02/11/23 0645  WBC 4.4  HGB 11.8*  HCT 35.4*  PLT 163   Recent Labs    02/10/23 0607 02/11/23 0645  NA 132* 136  K 4.6 4.4  CL 102 103  CO2 25 22  GLUCOSE 125* 97  BUN 43* 36*  CREATININE 1.40* 1.27*  CALCIUM 8.2* 8.2*    Intake/Output Summary (Last 24 hours) at 02/11/2023 1411 Last data filed at 02/11/2023 1358 Gross per 24 hour  Intake 837 ml  Output --  Net 837 ml        Physical Exam: Vital Signs Blood pressure (!) 145/104, pulse 85, temperature 98.2 F (36.8 C), temperature source Oral, resp. rate 17, height '5\' 7"'$  (1.702 m), weight 56.8 kg, SpO2 99 %.  Constitutional:  NAD, laying in bed HENT: [place]/AT, MMM Eyes:     Extraocular Movements: Extraocular movements intact.     Pupils: Pupils are equal, round, and reactive to light.     Comments: Vision poor  Cardiovascular: RRR, nl s1/s2, no m/r/g, no pedal edema Pulmonary: CTAB on anterior fields, did not auscultate posterior fields this morning, no w/r/r appreciated. No cough during exam.  Abdominal: Soft, NTND, +BS throughout, no r/g/r Musculoskeletal:        General: No swelling or tenderness.     Cervical back: Normal range of motion.  Skin:    General: Skin is warm.     Findings: Bruising present.   PRIOR EXAM: Neurological:     Mental Status: He is alert.     Comments: Alert and oriented x 3. Normal insight and awareness. Intact Memory. Normal language and speech. Cranial nerve exam unremarkable. UE 5/5 prox to distal.  LE: 4- HF, KE and 4+/5 ADF/PF. Sensory exam normal for light touch and pain in all 4 limbs. No limb ataxia or cerebellar signs. No abnormal tone appreciated.    Psychiatric:        Mood and Affect: Mood normal.        Behavior: Behavior normal.   Assessment/Plan: 1. Functional deficits which require 3+ hours per day of interdisciplinary therapy in a comprehensive inpatient rehab setting. Physiatrist is providing close team supervision and 24 hour management of active medical problems listed below. Physiatrist and rehab team continue to assess barriers to discharge/monitor patient progress toward functional and medical goals  Care Tool:  Bathing    Body parts bathed by patient: Right arm, Left arm, Chest, Abdomen, Front perineal area, Buttocks, Right upper leg, Left upper leg, Right lower leg, Left lower leg, Face         Bathing assist Assist Level: Minimal Assistance - Patient > 75%     Upper Body Dressing/Undressing Upper body dressing   What is the patient wearing?:  Pull over shirt    Upper body assist Assist Level: Minimal Assistance - Patient > 75%    Lower Body Dressing/Undressing Lower body dressing      What is the patient wearing?: Pants, Underwear/pull up     Lower body assist Assist for lower body dressing: Maximal Assistance - Patient 25 - 49%     Toileting Toileting    Toileting assist Assist for toileting: Minimal Assistance - Patient > 75%     Transfers Chair/bed transfer  Transfers assist     Chair/bed transfer assist level: Minimal Assistance - Patient > 75%     Locomotion Ambulation   Ambulation assist      Assist level: Minimal Assistance - Patient > 75%   Max distance: 144f   Walk 10 feet activity   Assist     Assist level: Minimal Assistance - Patient > 75% Assistive device: Walker-rolling   Walk 50 feet activity   Assist    Assist level: Minimal Assistance - Patient > 75% Assistive device: Walker-rolling    Walk  150 feet activity   Assist    Assist level: Minimal Assistance - Patient > 75% Assistive device: Walker-rolling    Walk 10 feet on uneven surface  activity   Assist Walk 10 feet on uneven surfaces activity did not occur: Safety/medical concerns         Wheelchair     Assist Is the patient using a wheelchair?: No             Wheelchair 50 feet with 2 turns activity    Assist            Wheelchair 150 feet activity     Assist          Blood pressure (!) 145/104, pulse 85, temperature 98.2 F (36.8 C), temperature source Oral, resp. rate 17, height '5\' 7"'$  (1.702 m), weight 56.8 kg, SpO2 99 %.  Medical Problem List and Plan: 1. Functional deficits secondary to debility related to COVID and multiple medical issues             -patient may shower             -ELOS/Goals: 10-12 days, supervision to mod I goals with PT, OT  -Cont CIR 2.  Antithrombotics: -DVT/anticoagulation:  Pharmaceutical: Lovenox '30mg'$  QD--discussed with pt the need for lovenox injections today.              -antiplatelet therapy: ASA '81mg'$  QD 3. Pain Management: Tylenol prn 4. Mood/Behavior/Sleep: LCSW to follow for evaluation and suppor.t              -antipsychotic agents: continue Seroquel '25mg'$  at bedtime.   -Trazodone 25-'50mg'$  QHS PRN 5. Neuropsych/cognition: This patient is capable of making decisions on his own behalf. 6. Skin/Wound Care: Routine pressure relief measures.  7. Fluids/Electrolytes/Nutrition: Monitor I/O. Monitor weekly labs starting 02/13/23             --Ensure added for malnutrition.  -02/11/23 CMP with improving BUN/Cr 36/1.27, mildly bumped ALT but AST WNL--recheck CMP on Mon 02/13/23, with weekly BMPs thereafter 8. Covid PNA: Has completed Paxlovid/decadron to complete tomorrow. Respiratory precautions thru 03/11.              --pulmonary hygiene with flutter valve.  -Cont Claritin '10mg'$  QD, Cepacol lozenges TID, Chloraseptic spray PRN, RobitussinDM PRN,  Benadryl PRN, Atrovent nasal spray PRN 9. Delirium w/agitation/hallucinations: Monitor sleep/wake cycle. Continue Seroquel.  10. Hyponatremia: Recheck Na level in am  -02/11/23  Na 136, monitor next 02/13/23 11. Acute on CKD: SCr was 1.4. Has had left nephrectomy for CA.   --has had worsening of renal status BUN/Scr 22/1.45-->45/1.55 --Encourage fluids. Recheck in am.   -02/11/23 BUN/Cr improving, 36/1.27. Monitor on labs next 02/13/23 12.  Anemia/thrombocytopenia: admission Hgb/Plt-11.2/91 -->12.2/151             --Likely improved due to hemoconcentration. Recheck in am.  -02/11/23 Hgb 11.8/Hct 35.4, plt 163, all stabilizing; monitor on weekly labs, next 02/13/23 13. HTN and CAD s/p PTCA: On ASA and Lipitor '40mg'$  QD. BB was d/c due to bradycardia. On Amlodipine '10mg'$  QD             --has been off enalapril due to acute on chronic renal failure.   -02/11/23 BP fairly stable, monitor for now Vitals:   02/10/23 1300  BP: (!) 145/104    14. Chronic dizziness/Legally blind (macular degeneration): Meclizine '25mg'$  prn. --Fall precautions. -Cont Prosight vitamins BID    LOS: 1 days A FACE TO [place] 02/11/2023, 2:11 PM

## 2023-02-12 DIAGNOSIS — R338 Other retention of urine: Secondary | ICD-10-CM

## 2023-02-12 DIAGNOSIS — R5381 Other malaise: Secondary | ICD-10-CM | POA: Diagnosis not present

## 2023-02-12 DIAGNOSIS — N179 Acute kidney failure, unspecified: Secondary | ICD-10-CM | POA: Diagnosis not present

## 2023-02-12 DIAGNOSIS — D649 Anemia, unspecified: Secondary | ICD-10-CM | POA: Diagnosis not present

## 2023-02-12 MED ORDER — ENSURE ENLIVE PO LIQD
237.0000 mL | Freq: Three times a day (TID) | ORAL | Status: DC
  Administered 2023-02-13 – 2023-02-21 (×8): 237 mL via ORAL

## 2023-02-12 NOTE — Progress Notes (Signed)
Occupational Therapy Session Note  Patient Details  Name: Donald Todd MRN: [id number] Date of Birth: May 11, 1929  Today's Date: 02/12/2023 OT Individual Time: 1115-1200 OT Individual Time Calculation (min): 45 min    Short Term Goals: Week 1:  OT Short Term Goal 1 (Week 1): STG=LTG d/t ELOS  Skilled Therapeutic Interventions/Progress Updates:   The pt was resting in bed upon arrival, the pt in agreement with working on BADL related task.  The pt indicated that he rested well and that he had no complaint of pain at the time of arrival.  The pt was able to come from bed LOF to EOB with S, he was able to transfer from EOB to w/c LOF with close S.  The pt was able to bathe at sink LOF for his UB at s/u assist, he was MinA for LB.  The pt was able to transfer back to bed with close S using the bed rail for the completion of ambulating to the bed with MinA for managing BLE. The pt was was able to assist with bed mobility for going up in the bed by incorporating his feet while I positioned him with the bed chuck.  At the end of treatment, the call light and bedside table were both within reach and the bed alarm was activated.   Therapy Documentation Precautions:  Precautions Precautions: Fall Restrictions Weight Bearing Restrictions: No  Therapy/Group: Individual Therapy  Yvonne Kendall 02/12/2023, 12:15 PM

## 2023-02-12 NOTE — Progress Notes (Signed)
Initial Nutrition Assessment RD working remotely.   DOCUMENTATION CODES:   Not applicable  INTERVENTION:  - increased Ensure Plus High Protein from BID to TID, each supplement provides 350 kcal and 20 grams of protein.  - complete NFPE when feasible.  - entered High Calorie, High Protein handout from the Academy of [employer] Dietetics into the AVS.  - ordered 48 hour Calorie Count per consult request.   NUTRITION DIAGNOSIS:   Increased nutrient needs related to acute illness, catabolic illness ([id number] infection) as evidenced by estimated needs.  GOAL:   Patient will meet greater than or equal to 90% of their needs  MONITOR:   PO intake, Supplement acceptance, Labs, Weight trends  REASON FOR ASSESSMENT:   Malnutrition Screening Tool, Consult Calorie Count, Assessment of nutrition requirement/status (patient reports 60 lb weight loss in the past 6 months)  ASSESSMENT:   89 year-old male with medical history of CAD, renal cancer w/ intra-abdominal/psoas muscle mets s/p left nephrectomy, CKD, prostate cancer, HTN, legally blind, chronic vertigo. He was admitted on 02/03/23 to [facility] due to 2 days of cough, sore throat, weakness, dizziness, and poor po intake. He was found to be positive for Covid 19 and admitted due to concerns of hypoxia. He was started on Paxlovid and low dose decadron. He was noted to have diarrhea. PT/OT has been working with patient who was noted to be limited by weakness with unsteady gait and balance deficits. CIR recommended due to functional decline.  Unable to reach patient on the phone this afternoon. He has mainly been eating 100% of meals while on 4W; from lunch on 3/8 until dinner on 3/9 and ate 80% of breakfast this AM.  Ensure ordered BID when he was admitted and he has accepted all bottles of the supplement.  He has not been assessed by a [place] RD at any time in the past.  Weight on 3/8 was 125 lb and weight on 02/03/23 was 138  lb. This indicates 13 lb / 9.4% wt loss in 1 week; significant for time frame. Weight had been stable 08/04/22-02/03/23. Non-pitting edema to BLE documented in the edema section of flow sheet on 3/9 PM.   Labs reviewed; BUN: 38 mg/dl, creatinine: 1.27 mg/dl, Ca: 8.2 mg/dl, GFR: 53 ml/min.  Medications reviewed; 1 tablet prosight/day.    NUTRITION - FOCUSED PHYSICAL EXAM:  RD working remotely.  Diet Order:   Diet Order             Diet regular Room service appropriate? No; Fluid consistency: Thin  Diet effective now                   EDUCATION NEEDS:   Education needs have been addressed  Skin:  Skin Assessment: Reviewed RN Assessment  Last BM:  3/10 (type 5 x2, one small amount and one medium amount)  Height:   Ht Readings from Last 1 Encounters:  02/10/23 '5\' 7"'$  (1.702 m)    Weight:   Wt Readings from Last 1 Encounters:  02/10/23 56.8 kg    BMI:  Body mass index is 19.61 kg/m.  Estimated Nutritional Needs:  Kcal:  1725-1925 kcal Protein:  85-100 grams Fluid:  >/= 1.9 L/day     Jarome Matin, MS, RD, LDN, CNSC Clinical Dietitian PRN/Relief staff On-call/weekend pager # available in [facility]

## 2023-02-12 NOTE — Progress Notes (Signed)
Patient urinated  100 cc in urinal standing on the side of bed. PVR was 538. Double void recommended and pt agreed. He was assisted to the bedside commode. Pt voided small amount of urine and pvr was 641. This writer attempted in and out cath with regular straight cath but could not pass through his prostate. Coude catheter were used by me and charge RN, Roselyn Reef without success. Patient resting in bed and does not complain of discomfort at this time. [employer], on call was informed and ordered to wait for next voiding and scan again. If still not successful, may consider urology consult.

## 2023-02-12 NOTE — Discharge Instructions (Addendum)
Inpatient Rehab Discharge Instructions  Donald Todd Discharge date and time: 02/21/23   Activities/Precautions/ Functional Status: Activity: no lifting, driving, or strenuous exercise till cleared by MD Diet: regular diet Need to increase water intake- ( you are getting dehydrated Wound Care: none needed   Functional status:  ___ No restrictions     ___ Walk up steps independently __X_ 24/7 supervision/assistance   ___ Walk up steps with assistance ___ Intermittent supervision/assistance  ___ Bathe/dress independently ___ Walk with walker     _X__ Bathe/dress with assistance ___ Walk Independently    ___ Shower independently ___ Walk with assistance    ___ Shower with assistance __X_ No alcohol     ___ Return to work/school ________   COMMUNITY REFERRALS UPON DISCHARGE:    Home Health:   PT     OT                    Agency: [facility]  Phone: [phone]    Medical Equipment/Items Ordered: Rollator and Bedside Commode                                                 Agency/Supplier: [employer] [phone]    Special Instructions: Foley care twice a day, as needed and after BM. Wear brace on right wrist for support.    My questions have been answered and I understand these instructions. I will adhere to these goals and the provided educational materials after my discharge from the hospital.  Patient/Caregiver Signature _______________________________ Date __________  Clinician Signature _______________________________________ Date __________  Please bring this form and your medication list with you to all your follow-up doctor's appointments.      High Calorie, High Protein Nutrition Therapy  A high-calorie, high-protein diet has been recommended to you. Your registered dietitian nutritionist (RDN) may have recommended this diet because you are having difficulty eating enough calories throughout the day, you have lost weight, and/or you need to add protein to your  diet.  Sometimes you may not feel like eating, even if you know the importance of good nutrition. The recommendations in this handout can help you with the following:  Regaining your strength and energy Keeping your body healthy Healing and recovering from surgery or illness and fighting infection  Schedule Your Meals and Snacks  Several small meals and snacks are often better tolerated and digested than large meals.  Strategies  Plan to eat 3 meals and 3 snacks daily. Experiment with timing meals to find out when you have a larger appetite. Appetite may be greatest in the morning after not eating all night so you may prefer to eat your larger meals and snacks in the morning and at lunch. Breakfast-type foods are often better tolerated so eat foods such as eggs, pancakes, waffles and cereal for any meal or snack. Carry snacks with you so you are prepared to eat every 2 to 3 hours. Determine what works best for you if your body's cues for feeling hungry or full are not working. Eat a small meal or snack even if you don't feel hungry. Set a timer to remind you when it is time to eat. Take a walk before you eat (with health care provider's approval). Light or moderate physical activity can help you maintain muscle and increase your appetite.  Make Eating Enjoyable  Taking steps to  make the experience enjoyable may help to increase your interest in eating and improve your appetite.  Strategies:  Eat with others whenever possible. Include your favorite foods to make meals more enjoyable. Try new foods. Save your beverage for the end of the meal so that you have more room for food before you get full.  Add Calories to Your Meals and Snacks  Try adding calorie-dense foods so that each bite provides more nutrition.  Strategies  Drink milk, chocolate milk, soy milk, or smoothies instead of low-calorie beverages such as diet drinks or water. Cook with milk or soy milk instead of  water when making dishes such as hot cereal, cocoa, or pudding. Add jelly, jam, honey, butter or margarine to bread and crackers. Add jam or fruit to ice cream and as a topping over cake. Mix dried fruit, nuts, granola, honey, or dry cereal with yogurt or hot cereals. Enjoy snacks such as milkshakes, smoothies, pudding, ice cream, or custard. Blend a fruit smoothie of a banana, frozen berries, milk or soy milk, and 1 tablespoon nonfat powdered milk or protein powder.  Add Protein to Your Meals and Snacks  Choose at least one protein food at each meal and snack to increase your daily intake.  Strategies  Add  cup nonfat dry milk powder or protein powder to make a high-protein milk to drink or to use in recipes that call for milk. Vanilla or peppermint extract or unsweetened cocoa powder could help to boost the flavor. Add hard-cooked eggs, leftover meat, grated cheese, canned beans or tofu to noodles, rice, salads, sandwiches, soups, casseroles, pasta, tuna and other mixed dishes. Add powdered milk or protein powder to hot cereals, meatloaf, casseroles, scrambled eggs, sauces, cream soups, and shakes. Add beans and lentils to salads, soups, casseroles, and vegetable dishes. Eat cottage cheese or yogurt, especially Greek yogurt, with fruit as a snack or dessert. Eat peanut or other nut butters on crackers, bread, toast, waffles, apples, bananas or celery sticks. Add it to milkshakes, smoothies, or desserts. Consider a ready-made protein shake.   Add Fats to Your Meals and Snacks  Try adding fats to your meals and snacks. Fat provides more calories in fewer bites than carbohydrate or protein and adds flavors to your foods.  Strategies  Snack on nuts and seeds or add them to foods like salads, pasta, cereals, yogurt, and ice cream.  Saut or stir-fry vegetables, meats, chicken, fish or tofu in olive or canola oil.  Add olive oil, other vegetable oils, butter or margarine to soups,  vegetables, potatoes, cooked cereal, rice, pasta, bread, crackers, pancakes, or waffles. Snack on olives or add to pasta, pizza, or salad. Add avocado or guacamole to your salads, sandwiches, and other entrees. Include fatty fish such as salmon in your weekly meal plan.  Tips For Adding Protein Nutrition Therapy    Tips Add extra egg to one or more meals  Increase the portion of milk to drink and change to skim milk if able  Include [place] yogurt or cottage cheese for snack or part of a meal  Increase portion size of protein entre and decrease portion of starch/bread  Mix protein powder, nut butter, almond/nut milk, non-fat dry milk, or Greek yogurt to shakes and smoothies  Use these ingredients also in baked goods or other recipes Use double the amount of sandwich filling  Add protein foods to all snacks including cheese, nut butters, milk and yogurt  Food Tips for Including Protein  Beans Cook and  use dried peas, beans, and tofu in soups or add to casseroles, pastas, and grain dishes that also contain cheese or meat  Mash with cheese and milk  Use tofu to make smoothies  Commercial Protein Supplements Use nutritional supplements or protein powder sold at pharmacies and grocery stores  Use protein powder in milk drinks and desserts, such as pudding  Mix with ice cream, milk, and fruit or other flavorings for a high-protein milkshake  Cottage Cheese or [employer] Mix with or use to stuff fruits and vegetables  Add to casseroles, spaghetti, noodles, or egg dishes such as omelets, scrambled eggs, and souffls  Use gelatin, pudding-type desserts, cheesecake, and pancake or waffle batter  Use to stuff crepes, pasta shells, or manicotti  Puree and use as a substitute for sour cream  Eggs, Egg whites, and Egg Yolks Add chopped, hard-cooked eggs to salads and dressings, vegetables, casseroles, and creamed meats  Beat eggs into mashed potatoes, vegetable purees, and sauces  Add extra egg  whites to quiches, scrambled eggs, custards, puddings, pancake batter, or [place] toast wash/batter  Make a rich custard with egg yolks, double strength milk, and sugar  Add extra hard-cooked yolks to deviled egg filling and sandwich spreads  Hard or Semi-Soft Cheese (Cheddar, Barnabas Lister, [place]) Melt on sandwiches, bread, muffins, tortillas, hamburgers, hot dogs, other meats or fish, vegetables, eggs, or desserts such as stewed figs or pies  Grate and add to soups, sauces, casseroles, vegetable dishes, potatoes, rice noodles, or meatloaf  Serve as a snack with crackers or bagels  Ice cream, Yogurt, and Frozen Yogurt Add to milk drinks such as milkshakes  Add to cereals, fruits, gelatin desserts, and pies  Blend or whip with soft or cooked fruits  Sandwich ice cream or frozen yogurt between enriched cake slices, cookies, or graham crackers  Use seasoned yogurt as a dip for fruits, vegetables, or chips  Use yogurt in place of sour cream in casseroles  Meat and Fish Add chopped, cooked meat or fish to vegetables, salads, casseroles, soups, sauces, and biscuit dough  Use in omelets, souffls, quiches, and sandwich fillings  Add chicken and [place] to stuffing  Wrap in pie crust or biscuit dough as turnovers  Add to stuffed baked potatoes  Add pureed meat to soups  Milk Use in beverages and in cooking  Use in preparing foods, such as hot cereal, soups, cocoa, or pudding  Add cream sauces to vegetable and other dishes  Use evaporated milk, evaporated skim milk, or sweetened condensed milk instead of milk or water in recipes.  Nonfat Dry Milk Add 1/3 cup of nonfat dry milk powdered milk to each cup of regular milk for "double strength" milk  Add to yogurt and milk drinks, such as pasteurized eggnog and milkshakes  Add to scrambled eggs and mashed potatoes  Use in casseroles, meatloaf, hot cereal, breads, muffins, sauces, cream soups, puddings and custards, and other milk-based desserts  Nuts, Seeds, and  Wheat Germ Add to casseroles, breads, muffins, pancakes, cookies, and waffles  Sprinkle on fruit, cereal, ice cream, yogurt, vegetables, salads, and toast as a crunchy topping  Use in place of breadcrumbs  Blend with parsley or spinach, herbs, and cream for a noodle, pasta, or vegetable sauce.  Roll banana in chopped nuts  Peanut Butter Spread on sandwiches, toast, muffins, crackers, waffles, pancakes, and fruit slices  Use as a dip for raw vegetables, such as carrots, cauliflower, and celery  Blend with milk drinks, smoothies, and other beverages  Swirl through soft ice cream or yogurt  Spread on a banana then roll in crushed, dry cereal or chopped nuts   Small Meal and Snack Ideas  These snacks and meals are recommended when you have to eat but aren't necessarily hungry.  They are good choices because they are high in protein and high in calories.   2 graham crackers, 2 tablespoons peanut or other nut butter, 1 cup milk  cup [place] yogurt,  cup fruit,  cup granola 2 deviled egg halves, 5 whole wheat crackers 1 cup cream of tomato soup,  grilled cheese sandwich 1 toasted waffle topped with: 2 tablespoons peanut or nut butter, 1 tablespoon jam Trail mix made with:  cup nuts,  cup dried fruit,  cup cold cereal, any variety  cup oatmeal or cream of wheat cereal, 1 tablespoon peanut or nut butter,  cup diced fruit  High-Calorie, High-Protein Sample 1-Day Menu   Breakfast 1 egg, scrambled 1 ounce cheddar cheese 1 English muffin, whole wheat 1 tablespoon margarine 1 tablespoon jam  cup orange juice, fortified with calcium and vitamin D  Morning Snack 1 tablespoon peanut butter 1 banana 1 cup 1% milk  Lunch Tuna salad sandwich made with: 2 slices bread, whole wheat 3 ounces tuna mixed with: 1 tablespoon mayonnaise  cup pudding  Afternoon Snack  cup hummus  cup carrots 1 pita  Evening Meal Enchilada casserole made with: 2 corn tortillas 3 ounces ground beef,  cooked  cup black beans, cooked  cup corn, cooked 1 ounce grated cheddar cheese  cup enchilada sauce  avocado, sliced, topping for enchilada 1 tablespoon sour cream, topping for enchilada Salad:  cup lettuce, shredded  cup tomatoes, chopped, for salad 1 tablespoon olive oil and vinegar dressing, for salad  Evening Snack  cup Greek yogurt  cup blueberries  cup granola  Copyright 2020  Academy of [employer]. All rights reserved

## 2023-02-12 NOTE — Progress Notes (Signed)
PROGRESS NOTE   Subjective/Complaints: High PVR last night, nursing unsuccessful with in and out cath after multiple attempts. Pt was resting comfortably at that point so we allowed him more time to try to empty-- still unable this morning, urology consulted and Dr. Kathrynn Ducking (resident) came and placed 12Fr foley. Appreciate their assistance, pt much more comfortable.  Otherwise denies any issues today, slept well, not having pain, had a small BM this morning. No other complaints or concerns today.   ROS: Denies fevers, chills, CP, SOB, abd pain, N/V/D/C, new/worsening paresthesias/weakness, or any other complaints at this time.     Objective:   No results found. Recent Labs    02/11/23 0645  WBC 4.4  HGB 11.8*  HCT 35.4*  PLT 163   Recent Labs    02/10/23 0607 02/11/23 0645  NA 132* 136  K 4.6 4.4  CL 102 103  CO2 25 22  GLUCOSE 125* 97  BUN 43* 36*  CREATININE 1.40* 1.27*  CALCIUM 8.2* 8.2*    Intake/Output Summary (Last 24 hours) at 02/12/2023 1137 Last data filed at 02/12/2023 0830 Gross per 24 hour  Intake 837 ml  Output 300 ml  Net 537 ml        Physical Exam: Vital Signs Blood pressure (!) 153/82, pulse 87, temperature 98.9 F (37.2 C), temperature source Oral, resp. rate 18, height '5\' 7"'$  (1.702 m), weight 56.8 kg, SpO2 95 %.  Constitutional:  NAD, laying in bed HENT: [place]/AT, MMM Eyes: PERRL, EOMI, poor vision Cardiovascular: RRR, nl s1/s2, no m/r/g, no pedal edema Pulmonary: CTAB on anterior fields, again did not auscultate posterior fields this morning, no w/r/r appreciated. No cough during exam.  Abdominal: Soft, NTND, +BS throughout, no r/g/r Musculoskeletal:        General: No swelling or tenderness.     Cervical back: Normal range of motion.  Skin:    General: Skin is warm.     Findings: Bruising present.   PRIOR EXAM: Neurological:     Mental Status: He is alert.     Comments: Alert  and oriented x 3. Normal insight and awareness. Intact Memory. Normal language and speech. Cranial nerve exam unremarkable. UE 5/5 prox to distal. LE: 4- HF, KE and 4+/5 ADF/PF. Sensory exam normal for light touch and pain in all 4 limbs. No limb ataxia or cerebellar signs. No abnormal tone appreciated.    Psychiatric:        Mood and Affect: Mood normal.        Behavior: Behavior normal.   Assessment/Plan: 1. Functional deficits which require 3+ hours per day of interdisciplinary therapy in a comprehensive inpatient rehab setting. Physiatrist is providing close team supervision and 24 hour management of active medical problems listed below. Physiatrist and rehab team continue to assess barriers to discharge/monitor patient progress toward functional and medical goals  Care Tool:  Bathing    Body parts bathed by patient: Right arm, Left arm, Chest, Abdomen, Front perineal area, Buttocks, Right upper leg, Left upper leg, Right lower leg, Left lower leg, Face         Bathing assist Assist Level: Minimal Assistance - Patient > 75%  Upper Body Dressing/Undressing Upper body dressing   What is the patient wearing?: Pull over shirt    Upper body assist Assist Level: Minimal Assistance - Patient > 75%    Lower Body Dressing/Undressing Lower body dressing      What is the patient wearing?: Pants, Underwear/pull up     Lower body assist Assist for lower body dressing: Maximal Assistance - Patient 25 - 49%     Toileting Toileting    Toileting assist Assist for toileting: Minimal Assistance - Patient > 75%     Transfers Chair/bed transfer  Transfers assist     Chair/bed transfer assist level: Minimal Assistance - Patient > 75%     Locomotion Ambulation   Ambulation assist      Assist level: Minimal Assistance - Patient > 75%   Max distance: 131f   Walk 10 feet activity   Assist     Assist level: Minimal Assistance - Patient > 75% Assistive device:  Walker-rolling   Walk 50 feet activity   Assist    Assist level: Minimal Assistance - Patient > 75% Assistive device: Walker-rolling    Walk 150 feet activity   Assist    Assist level: Minimal Assistance - Patient > 75% Assistive device: Walker-rolling    Walk 10 feet on uneven surface  activity   Assist Walk 10 feet on uneven surfaces activity did not occur: Safety/medical concerns         Wheelchair     Assist Is the patient using a wheelchair?: No             Wheelchair 50 feet with 2 turns activity    Assist            Wheelchair 150 feet activity     Assist          Blood pressure (!) 153/82, pulse 87, temperature 98.9 F (37.2 C), temperature source Oral, resp. rate 18, height '5\' 7"'$  (1.702 m), weight 56.8 kg, SpO2 95 %.  Medical Problem List and Plan: 1. Functional deficits secondary to debility related to COVID and multiple medical issues             -patient may shower             -ELOS/Goals: 10-12 days, supervision to mod I goals with PT, OT  -Cont CIR 2.  Antithrombotics: -DVT/anticoagulation:  Pharmaceutical: Lovenox '30mg'$  QD--discussed with pt the need for lovenox injections today.              -antiplatelet therapy: ASA '81mg'$  QD 3. Pain Management: Tylenol prn 4. Mood/Behavior/Sleep: LCSW to follow for evaluation and suppor.t              -antipsychotic agents: continue Seroquel '25mg'$  at bedtime.   -Trazodone 25-'50mg'$  QHS PRN 5. Neuropsych/cognition: This patient is capable of making decisions on his own behalf. 6. Skin/Wound Care: Routine pressure relief measures.  7. Fluids/Electrolytes/Nutrition: Monitor I/O. Monitor weekly labs starting 02/13/23             --Ensure added for malnutrition.  -02/11/23 CMP with improving BUN/Cr 36/1.27, mildly bumped ALT but AST WNL--recheck CMP on Mon 02/13/23, with weekly BMPs thereafter 8. Covid PNA: Has completed Paxlovid/decadron to complete tomorrow. Respiratory precautions thru 03/11.               --pulmonary hygiene with flutter valve.  -Cont Claritin '10mg'$  QD, Cepacol lozenges TID, Chloraseptic spray PRN, RobitussinDM PRN, Benadryl PRN, Atrovent nasal spray PRN 9. Delirium w/agitation/hallucinations: Monitor sleep/wake  cycle. Continue Seroquel.  10. Hyponatremia: Recheck Na level in am  -02/11/23 Na 136, monitor next 02/13/23 11. Acute on CKD: SCr was 1.4. Has had left nephrectomy for CA.   --has had worsening of renal status BUN/Scr 22/1.45-->45/1.55 --Encourage fluids. Recheck in am.   -02/11/23 BUN/Cr improving, 36/1.27. Monitor on labs next 02/13/23 12.  Anemia/thrombocytopenia: admission Hgb/Plt-11.2/91 -->12.2/151             --Likely improved due to hemoconcentration. Recheck in am.  -02/11/23 Hgb 11.8/Hct 35.4, plt 163, all stabilizing; monitor on weekly labs, next 02/13/23 13. HTN and CAD s/p PTCA: On ASA and Lipitor '40mg'$  QD. BB was d/c due to bradycardia. On Amlodipine '10mg'$  QD             --has been off enalapril due to acute on chronic renal failure.  -02/12/23 BP fairly stable, monitor for now (not all BPs showing up on this table, but reviewed BPs in chart) Vitals:   02/10/23 1300 02/12/23 0353  BP: (!) 145/104 (!) 153/82     14. Chronic dizziness/Legally blind (macular degeneration): Meclizine '25mg'$  prn. --Fall precautions. -Cont Prosight vitamins BID 15. Hx of prostate and renal cancer s/p L nephrectomy, with acute urinary retention: -02/12/23 having urinary retention issues overnight, nursing unsuccessful at placing foley, Dr. Kathrynn Ducking of urology saw pt this morning and placed 12Fr foley; draining slowly, recommended leaving foley in for 1wk if <1L drains or 2wks if 2+L drains, to allow healing of mild stretch injury. Appreciate her assistance. Nursing to document foley output    LOS: 2 days A FACE TO [place] 02/12/2023, 11:37 AM

## 2023-02-12 NOTE — Procedures (Signed)
Foley Catheter Placement Note  Indications: 89 y.o. male with prostate cancer s/p brachytherapy, HTN, RCC s/p left nephrectomy, and recently diagnosed COVID-19 infection admitted with hypoxia. Developed urinary retention, RN  staff unable to place catheter at bedside.  Pre-operative Diagnosis: Urinary retention, 808cc on bladder scan  Post-operative Diagnosis: Same  Surgeon: Josph Macho, MD  Assistants: None  Procedure Details  Patient was placed in the supine position, prepped with Betadine and draped in the usual sterile fashion.  We injected lidocaine jelly per urethra prior to the procedure.  We then attempted to insert an 18Fr coude catheter but met resistance at the prostate. I then inserted a 12 [place] 2-way catheter per urethra which easily passed into the bladder without any resistance at the prostatic urethra.  We achieved return of clear yellow urine and then proceeded to insert 10 mL of sterile water into the Foley balloon.  The catheter was attached to a drainage bag and secured with a StatLock.  Placement of the catheter had return of greater than 800 mL of clear yellow urine.               Complications: None; patient tolerated the procedure well.  Plan:   1.  Continue Foley catheter to drainage for 7-10 days due to greater than 800 mL of urine returned with catheter placement and mild bladder stretch injury 2.  If patient is discharged with catheter, please page on-call urology pager to set up follow up with [facility] for trial of void.

## 2023-02-13 ENCOUNTER — Inpatient Hospital Stay (HOSPITAL_COMMUNITY): Payer: Medicare Other

## 2023-02-13 DIAGNOSIS — R5381 Other malaise: Secondary | ICD-10-CM | POA: Diagnosis not present

## 2023-02-13 LAB — URINALYSIS, ROUTINE W REFLEX MICROSCOPIC
Bilirubin Urine: NEGATIVE
Glucose, UA: NEGATIVE mg/dL
Hgb urine dipstick: NEGATIVE
Ketones, ur: NEGATIVE mg/dL
Nitrite: NEGATIVE
Protein, ur: NEGATIVE mg/dL
Specific Gravity, Urine: 1.012 (ref 1.005–1.030)
pH: 7 (ref 5.0–8.0)

## 2023-02-13 LAB — COMPREHENSIVE METABOLIC PANEL
ALT: 63 U/L — ABNORMAL HIGH (ref 0–44)
AST: 26 U/L (ref 15–41)
Albumin: 2.7 g/dL — ABNORMAL LOW (ref 3.5–5.0)
Alkaline Phosphatase: 67 U/L (ref 38–126)
Anion gap: 9 (ref 5–15)
BUN: 33 mg/dL — ABNORMAL HIGH (ref 8–23)
CO2: 22 mmol/L (ref 22–32)
Calcium: 8.1 mg/dL — ABNORMAL LOW (ref 8.9–10.3)
Chloride: 100 mmol/L (ref 98–111)
Creatinine, Ser: 1.49 mg/dL — ABNORMAL HIGH (ref 0.61–1.24)
GFR, Estimated: 44 mL/min — ABNORMAL LOW (ref >60)
Glucose, Bld: 104 mg/dL — ABNORMAL HIGH (ref 70–99)
Potassium: 4.6 mmol/L (ref 3.5–5.1)
Sodium: 131 mmol/L — ABNORMAL LOW (ref 135–145)
Total Bilirubin: 1.3 mg/dL — ABNORMAL HIGH (ref 0.3–1.2)
Total Protein: 5.3 g/dL — ABNORMAL LOW (ref 6.5–8.1)

## 2023-02-13 LAB — CBC
HCT: 37 % — ABNORMAL LOW (ref 39.0–52.0)
Hemoglobin: 12.8 g/dL — ABNORMAL LOW (ref 13.0–17.0)
MCH: 31.1 pg (ref 26.0–34.0)
MCHC: 34.6 g/dL (ref 30.0–36.0)
MCV: 89.8 fL (ref 80.0–100.0)
Platelets: 161 10*3/uL (ref 150–400)
RBC: 4.12 MIL/uL — ABNORMAL LOW (ref 4.22–5.81)
RDW: 12.8 % (ref 11.5–15.5)
WBC: 20 10*3/uL — ABNORMAL HIGH (ref 4.0–10.5)
nRBC: 0 % (ref 0.0–0.2)

## 2023-02-13 LAB — PROCALCITONIN: Procalcitonin: 1.48 ng/mL

## 2023-02-13 LAB — LACTIC ACID, PLASMA
Lactic Acid, Venous: 1.6 mmol/L (ref 0.5–1.9)
Lactic Acid, Venous: 1.5 mmol/L (ref 0.5–1.9)

## 2023-02-13 NOTE — Progress Notes (Signed)
Inpatient Rehabilitation  Patient information reviewed and entered into eRehab system by Kristol Almanzar M. Yadiel Aubry, M.A., CCC/SLP, PPS Coordinator.  Information including medical coding, functional ability and quality indicators will be reviewed and updated through discharge.    

## 2023-02-13 NOTE — Progress Notes (Signed)
Inpatient [facility] Individual Statement of Services  Patient Name:  Donald Todd  Date:  02/13/2023  Welcome to the [facility].  Our goal is to provide you with an individualized program based on your diagnosis and situation, designed to meet your specific needs.  With this comprehensive rehabilitation program, you will be expected to participate in at least 3 hours of rehabilitation therapies Monday-Friday, with modified therapy programming on the weekends.  Your rehabilitation program will include the following services:  Physical Therapy (PT), Occupational Therapy (OT), Speech Therapy (ST), 24 hour per day rehabilitation nursing, Therapeutic Recreaction (TR), Neuropsychology, Care Coordinator, Rehabilitation Medicine, Nutrition Services, Pharmacy Services, and Other  Weekly team conferences will be held on Wednesdays to discuss your progress.  Your Inpatient Rehabilitation Care Coordinator will talk with you frequently to get your input and to update you on team discussions.  Team conferences with you and your family in attendance may also be held.  Expected length of stay: 10-12 Days  Overall anticipated outcome: supervision to mod I   Depending on your progress and recovery, your program may change. Your Inpatient Rehabilitation Care Coordinator will coordinate services and will keep you informed of any changes. Your Inpatient Rehabilitation Care Coordinator's name and contact numbers are listed  below.  The following services may also be recommended but are not provided by the [facility]:   [place] will be made to provide these services after discharge if needed.  Arrangements include referral to agencies that provide these services.  Your insurance has been verified to be:  Medicare A & B Your primary doctor is:  [name], [place]  Pertinent  information will be shared with your doctor and your insurance company.  Inpatient Rehabilitation Care Coordinator:  Erlene Quan, [place] or [phone]  Information discussed with and copy given to patient by: Dyanne Iha, 02/13/2023, 9:19 AM

## 2023-02-13 NOTE — IPOC Note (Signed)
Overall Plan of Care [facility]) Patient Details Name: Donald Todd MRN: [id number] DOB: 1929/02/05  Admitting Diagnosis: [place] [facility] Problems: Principal Problem:   Debility     Functional Problem List: Nursing Bowel, Bladder, Safety, Endurance, Medication Management, Pain  PT Balance, Sensory, Endurance, Motor  OT Balance, Endurance, Safety, Vision  SLP    TR         Basic ADL's: OT Grooming, Bathing, Dressing, Toileting     Advanced  ADL's: OT Simple Meal Preparation     Transfers: PT Bed Mobility, Bed to Chair, Car, Manufacturing systems engineer, Metallurgist: PT Ambulation, Stairs     Additional Impairments: OT    SLP        TR      Anticipated Outcomes Item Anticipated Outcome  Self Feeding S  Swallowing      Basic self-care  S  Toileting  S   Bathroom Transfers S  Bowel/Bladder  manage bowel w mod I and bladder w toileting  Transfers  Mod I  Locomotion  Supervision  Communication     Cognition     Pain  < 4 with prns  Safety/Judgment  manage w cues   Therapy Plan: PT Intensity: Minimum of 1-2 x/day ,45 to 90 minutes PT Frequency: Total of 15 hours over 7 days of combined therapies PT Duration Estimated Length of Stay: 7-10 days OT Intensity: Minimum of 1-2 x/day, 45 to 90 minutes OT Frequency: 5 out of 7 days OT Duration/Estimated Length of Stay: 7-10     Team Interventions: Nursing Interventions Bladder Management, Medication Management, Bowel Management, Disease Management/Prevention, Discharge Planning, Pain Management, Patient/Family Education  PT interventions Ambulation/gait training, Community reintegration, DME/adaptive equipment instruction, Neuromuscular re-education, Psychosocial support, Stair training, UE/LE Strength taining/ROM, Wheelchair propulsion/positioning, Training and development officer, Discharge planning, Functional electrical stimulation, Pain management, Skin care/wound management, Therapeutic  Activities, UE/LE Coordination activities, Cognitive remediation/compensation, Disease management/prevention, Functional mobility training, Patient/family education, Splinting/orthotics, Visual/perceptual remediation/compensation, Therapeutic Exercise  OT Interventions Training and development officer, DME/adaptive equipment instruction, Patient/family education, Therapeutic Activities, Wheelchair propulsion/positioning, Therapeutic Exercise, Psychosocial support, Functional electrical stimulation, Community reintegration, Functional mobility training, Self Care/advanced ADL retraining, UE/LE Strength taining/ROM, UE/LE Coordination activities, Skin care/wound managment, Neuromuscular re-education, Discharge planning, Disease mangement/prevention, Pain management, Splinting/orthotics, Visual/perceptual remediation/compensation  SLP Interventions    TR Interventions    SW/CM Interventions     Barriers to Discharge MD  Medical stability  Nursing Decreased caregiver support, Incontinence Multi level , main B+B left rail w spouse;wife on a rollator, unable to assist patient, daughter lives close by. Daughter and son can arrange for in home caregivers if needed  PT Inaccessible home environment, Decreased caregiver support, Home environment access/layout, Lack of/limited family support Pt livesw/ spouse who is unable to physically take care of pt  OT Decreased caregiver support    SLP      SW       Team Discharge Planning: Destination: PT-Home ,OT- Home , SLP-  Projected Follow-up: PT-Home health PT, OT-  Home health OT, SLP-  Projected Equipment Needs: PT-To be determined, OT- None recommended by OT, SLP-  Equipment Details: PT- , OT-  Patient/family involved in discharge planning: PT- Patient,  OT-Patient, SLP-   MD ELOS: 10-12 days Medical Rehab Prognosis:  Excellent Assessment: The patient has been admitted for CIR therapies with the diagnosis of debility. The team will be addressing functional  mobility, strength, stamina, balance, safety, adaptive techniques and equipment, self-care, bowel and bladder mgt, patient and caregiver education.  Goals have been set at supervision. Anticipated discharge destination is home.         See Team Conference Notes for weekly updates to the plan of care

## 2023-02-13 NOTE — Progress Notes (Signed)
Occupational Therapy Session Note  Patient Details  Name: Donald Todd MRN: [id number] Date of Birth: 12/16/1928  Today's Date: 02/13/2023 OT Individual Time: 1005-1045 OT Individual Time Calculation (min): 40 min    Short Term Goals: Week 1:  OT Short Term Goal 1 (Week 1): STG=LTG d/t ELOS  Skilled Therapeutic Interventions/Progress Updates:  Skilled OT intervention completed with focus on activity tolerance, UB bathing/dressing. Pt received semi-supine in bed, asleep, requiring increased time to arouse. 8/10 pain from headache reported; nurse made aware of pain med request though did not come during session. OT offered rest breaks, repositioning and distraction throughout for pain reduction.  Pt reluctant but agreeable to transition to EOB with very slow pace but able to do so with CGA. Difficulty getting BLE on floor with increased time needed. Pt with extensive kyphosis, and able to correct with verbal cues however inability to sustain due to lethargy/fatigue. Doffed gown with min A, then supervision for face/UB bathing and min A for redonning gown. Pt did indicate some dizziness but stated he's had vertigo. Politely declined standing due to head pain, fatigue and no meds provided. Pt much more limited this session vs over the weekend per review.   Emptied 1000 cc from catheter bag; yellow color; nurse notified. Pt required mod A for bed mobility into bed for BLE management. Difficulty scooting up towards HOB, but utilized bridging technique for boosting. 2x10 bridging in prep for independence with bed mobility with stability needed and provided at knees.  Pt remained semi-supine in bed, with bed alarm on/activated, and with all needs in reach at end of session.   Therapy Documentation Precautions:  Precautions Precautions: Fall Restrictions Weight Bearing Restrictions: No    Therapy/Group: Individual Therapy  Blase Mess, MS, OTR/L  02/13/2023, 12:10 PM

## 2023-02-13 NOTE — Progress Notes (Signed)
Inpatient Rehabilitation Care Coordinator Assessment and Plan Patient Details  Name: Donald Todd MRN: [id number] Date of Birth: 01/21/1929  Today's Date: 02/13/2023  Hospital Problems: Principal Problem:   Debility  Past Medical History:  Past Medical History:  Diagnosis Date   Arthritis    CAD (coronary artery disease)    last cath. 07-09-2010   Cancer [facility])    kidney cancer , prostate cancer    Chronic kidney disease    lt kidnet removed   H/O cardiovascular stress test 08-04-2010   ef 55% NO ISCHEMIA   H/O unilateral nephrectomy    lt. nephrectomy   Heart attack ([place]) 1993   History of kidney stones    Hyperlipemia    Hypertension    renal dopplers 218.13-normal patency   Inguinal hernia 08/01/11   right   Macular degeneration    Prostate cancer [facility])    Prostate disease    Cancer S/P seed implant   Renal mass    Vertigo    Past Surgical History:  Past Surgical History:  Procedure Laterality Date   ANGIOPLASTY  1993   athroscopic knee surgery  2002   right   bone scan  2005   CARDIAC CATHETERIZATION  07-09-2010   normal left ventricular function, coronary obsstructive disease with 20% proximal an 30-40% mid lt. anterior descending narrowing ; widely patent stent in the proximal ramus intermediate vessel; 50-70-% narrowing in the mid circumflex,and widely patent stent  in the rt. coronary    CARDIAC CATHETERIZATION  12-22-2003   widely patent stents with noncritical coronary artery disease and normalLV function   CHOLECYSTECTOMY  05/05/2009   Laparoscopic   colonscopy  2004   with biopsy   CORONARY ANGIOPLASTY WITH STENT PLACEMENT  03-15-1996   pci to rci and ramus branch using j&j [id number] stent,post dialated  at  20atm (rca)  and 14 atm (ramus branch. ef 55%             HERNIA REPAIR  08/01/11   RIH   INGUINAL HERNIA REPAIR Left 03/29/2013   Procedure: HERNIA REPAIR INGUINAL ADULT;  Surgeon: Edward Jolly, MD;  Location: [facility];  Service: General;   Laterality: Left;   INSERTION OF MESH Left 03/29/2013   Procedure: INSERTION OF MESH;  Surgeon: Edward Jolly, MD;  Location: [facility];  Service: General;  Laterality: Left;   JOINT REPLACEMENT  2006 and 2010   left 2006, right 2010   KIDNEY SURGERY  2002 and 2005   2002 - partial removal of left. 2005 complete removal   PROSTATE BIOPSY     prostate seed implant     radiation treatment  2002   prostate   REVERSE SHOULDER ARTHROPLASTY Left 05/26/2017   Procedure: REVERSE LEFT TOTAL SHOULDER ARTHROPLASTY;  Surgeon: Netta Cedars, MD;  Location: [place];  Service: Orthopedics;  Laterality: Left;   stent implants  1997   Social History:  reports that he quit smoking about 59 years ago. His smoking use included cigarettes and cigars. He has a 3.25 pack-year smoking history. He has never used smokeless tobacco. He reports that he does not drink alcohol and does not use drugs.  Family / Support Systems Marital Status: Married Patient Roles: Spouse Spouse/Significant Other: Dispensing optician Children: Son and daughter (Lives [place]) Other Supports: N/A Anticipated Caregiver: Daughter, Pamala Hurry Ability/Limitations of Caregiver: N/A;  Spouse uses rollator (Supervision) Caregiver Availability: 24/7 Family Dynamics: support form family  Social History Preferred language: English Religion: Non-Denominational Cultural Background: N/A Education:  N/A Health Literacy - How often do you need to have someone help you when you read instructions, pamphlets, or other written material from your doctor or pharmacy?: Never Writes: Yes Employment Status: Retired Public relations account executive Issues: N/A Guardian/Conservator: Tarry Kos   Abuse/Neglect Abuse/Neglect Assessment Can Be Completed: Yes Physical Abuse: Denies Verbal Abuse: Denies Sexual Abuse: Denies Exploitation of patient/patient's resources: Denies Self-Neglect: Denies  Patient response to: Social Isolation - How often do you feel lonely or  isolated from those around you?: Never  Emotional Status Pt's affect, behavior and adjustment status: Delirium reported. Recent Psychosocial Issues: coping Psychiatric History: N/A Substance Abuse History: N/A  Patient / Family Perceptions, Expectations & Goals Pt/Family understanding of illness & functional limitations: yes Premorbid pt/family roles/activities: Independent at baseline. Anticipated changes in roles/activities/participation: D/c home with spouse who uses rollator (supervision only). Daughter lives nearby and able to assist if needed. Potential to hire caregivers. Pt/family expectations/goals: Supervision to MOD I  [employer]: None Premorbid Home Care/DME Agencies: Other (Comment) ([employer], RW) Transportation available at discharge: Family transported patient and spouse at baseline Is the patient able to respond to transportation needs?: Yes In the past 12 months, has lack of transportation kept you from medical appointments or from getting medications?: No In the past 12 months, has lack of transportation kept you from meetings, work, or from getting things needed for daily living?: No Resource referrals recommended: Neuropsychology  Discharge Planning Living Arrangements: Spouse/significant other, Children Support Systems: Children Type of Residence: Private residence Insurance Resources: Multimedia programmer (specify) (1 Step to enter) Pensions consultant: Fish farm manager, Family Support Financial Screen Referred: No Living Expenses: Lives with family Money Management: Patient, Family Does the patient have any problems obtaining your medications?: No Home Management: Independent Patient/Family Preliminary Plans: Children able to assist with cognitive tasks. Care Coordinator Barriers to Discharge: Decreased caregiver support, Lack of/limited family support Care Coordinator Anticipated Follow Up Needs: [place] Additional  Notes/Comments: Spouses uses rolltor, unable to support physically. Hired caregivers if more support required. Expected length of stay: 10-12 Days  Clinical Impression SW met with patient, made attempt to call spouse and daughter at bedside- left detailed VM. Patient pt's chart, patient discharging home with spouse who uses a rollator. Daughter lives nearby and able to assist and potentially hiring caregivers if additional assistance is required. Family only able to provide supervision. 1 step to enter home. No additional questions or concerns.   Dyanne Iha 02/13/2023, 12:33 PM

## 2023-02-13 NOTE — Progress Notes (Signed)
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 Noted pt with constant cough and runny nose throughout session. Session with emphasis on functional mobility/transfers, generalized strengthening and endurance, and dynamic standing balance. Pt performed bed mobility with HOB elevated and min/light mod A to scoot to EOB with significantly increased time. Stood with RW from low sitting EOB with max A and attempted to ambulate, however pt reported feeling "too weak" and sat back down on EOB. Pt reported feeling much worse today>saturday and unsure why. Worked on blocked practice sit<>stands from elevated EOB with RW and mod A x 5 reps - pt required rest breaks in between reps and with difficulty with full knee and hip extension in stance. Pt politely declined any ambulation and requested to lie back down - max A for BLE management to transition into supine. Scooted to [facility] with max A using Trendelenburg bed position as pt too weak to use UEs to pull. Pt needed assist to pull tissues out of box to wipe nose, reporting even his hands felt weak. Concluded session with pt semi-reclined in bed, needs within reach, and bed alarm on.   Treatment Session 2 Received pt semi-reclined in bed, pt agreeable to PT treatment, and denied any pain during session. Pt reported still "not doing well" but appeared brighter and more awake compared to this  morning. Session with emphasis on functional mobility/transfers, generalized strengthening and endurance, gait training, and stair navigation. Pt transferred semi-reclined<>sitting EOB with HOB elevated and use of bedrails with supervision. Stood from EOB with RW and mod A and ambulated 166f with RW and min A fading to CGA to main therapy gym. Pt then navigated 4 3in steps with R handrail then required both handrails for remaining 4 3in steps ascending with CGA/min and required mod A when descending 4 6in steps due to 1 posterior LOB. Pt performed TUG with RW and CGA/min A with average of 59.5 seconds.  Trial 1: 71 seconds  Trial 2: 48 seconds [facility] to return to bed. Pt transferred WC<>bed stand<>pivot with RW and min A. Transferred sit<>supine with supervision and concluded session with pt semi-reclined in bed, needs within reach, and bed alarm on.   Therapy Documentation Precautions:  Precautions Precautions: Fall Restrictions Weight Bearing Restrictions: No  Therapy/Group: Individual Therapy AAlfonse AlpersPT, DPT  02/13/2023, 6:56 AM

## 2023-02-13 NOTE — Progress Notes (Signed)
PROGRESS NOTE   Subjective/Complaints: Sleepy this morning WBC increased to 20 Nurse notes coughing, SLP ordered. CXR and procal ordered  ROS: +cough   Objective:   No results found. Recent Labs    02/11/23 0645 02/13/23 0651  WBC 4.4 20.0*  HGB 11.8* 12.8*  HCT 35.4* 37.0*  PLT 163 161   Recent Labs    02/11/23 0645 02/13/23 0651  NA 136 131*  K 4.4 4.6  CL 103 100  CO2 22 22  GLUCOSE 97 104*  BUN 36* 33*  CREATININE 1.27* 1.49*  CALCIUM 8.2* 8.1*    Intake/Output Summary (Last 24 hours) at 02/13/2023 1030 Last data filed at 02/13/2023 0900 Gross per 24 hour  Intake 240 ml  Output --  Net 240 ml        Physical Exam: Vital Signs Blood pressure (!) 141/79, pulse 88, temperature 98.2 F (36.8 C), resp. rate 18, height '5\' 7"'$  (1.702 m), weight 56.8 kg, SpO2 100 %. Gen: no distress, normal appearing HEENT: oral mucosa pink and moist, NCAT Eyes:     Extraocular Movements: Extraocular movements intact.     Pupils: Pupils are equal, round, and reactive to light.     Comments: Vision poor  Cardiovascular:     Rate and Rhythm: Normal rate and regular rhythm.  Pulmonary:     Effort: Pulmonary effort is normal. No respiratory distress.     Breath sounds: Rhonchi present.     Comments: Intermittent cough Abdominal:     General: Bowel sounds are normal.     Palpations: Abdomen is soft.  Musculoskeletal:        General: No swelling or tenderness.     Cervical back: Normal range of motion.  Skin:    General: Skin is warm.     Findings: Bruising present.  Neurological:     Mental Status: He is alert.     Comments: Alert and oriented x 3. Normal insight and awareness. Intact Memory. Normal language and speech. Cranial nerve exam unremarkable. UE 5/5 prox to distal. LE: 4- HF, KE and 4+/5 ADF/PF. Sensory exam normal for light touch and pain in all 4 limbs. No limb ataxia or cerebellar signs. No abnormal  tone appreciated.    Psychiatric:        Mood and Affect: Mood normal.        Behavior: Behavior normal.    Assessment/Plan: 1. Functional deficits which require 3+ hours per day of interdisciplinary therapy in a comprehensive inpatient rehab setting. Physiatrist is providing close team supervision and 24 hour management of active medical problems listed below. Physiatrist and rehab team continue to assess barriers to discharge/monitor patient progress toward functional and medical goals  Care Tool:  Bathing    Body parts bathed by patient: Right arm, Left arm, Chest, Abdomen, Front perineal area, Buttocks, Right upper leg, Left upper leg, Right lower leg, Left lower leg, Face         Bathing assist Assist Level: Minimal Assistance - Patient > 75%     Upper Body Dressing/Undressing Upper body dressing   What is the patient wearing?: Pull over shirt    Upper body assist Assist Level: Minimal  Assistance - Patient > 75%    Lower Body Dressing/Undressing Lower body dressing      What is the patient wearing?: Pants, Underwear/pull up     Lower body assist Assist for lower body dressing: Maximal Assistance - Patient 25 - 49%     Toileting Toileting    Toileting assist Assist for toileting: Minimal Assistance - Patient > 75%     Transfers Chair/bed transfer  Transfers assist     Chair/bed transfer assist level: Minimal Assistance - Patient > 75%     Locomotion Ambulation   Ambulation assist      Assist level: Minimal Assistance - Patient > 75%   Max distance: 133f   Walk 10 feet activity   Assist     Assist level: Minimal Assistance - Patient > 75% Assistive device: Walker-rolling   Walk 50 feet activity   Assist    Assist level: Minimal Assistance - Patient > 75% Assistive device: Walker-rolling    Walk 150 feet activity   Assist    Assist level: Minimal Assistance - Patient > 75% Assistive device: Walker-rolling    Walk 10 feet  on uneven surface  activity   Assist Walk 10 feet on uneven surfaces activity did not occur: Safety/medical concerns         Wheelchair     Assist Is the patient using a wheelchair?: No             Wheelchair 50 feet with 2 turns activity    Assist            Wheelchair 150 feet activity     Assist          Blood pressure (!) 141/79, pulse 88, temperature 98.2 F (36.8 C), resp. rate 18, height '5\' 7"'$  (1.702 m), weight 56.8 kg, SpO2 100 %.  Medical Problem List and Plan: 1. Functional deficits secondary to debility related to COVID and multiple medical issues             -patient may shower             -ELOS/Goals: 10-12 days, supervision to mod I goals with PT, OT  Continue CIR 2.  Antithrombotics: -DVT/anticoagulation:  Pharmaceutical: Lovenox--discussed with pt the need for lovenox injections today.              -antiplatelet therapy: ASA.  3. Pain Management: Tylenol prn 4. Mood/Behavior/Sleep: LCSW to follow for evaluation and suppor.t              -antipsychotic agents: continue Seroquel at bedtime.  5. Neuropsych/cognition: This patient is capable of making decisions on his own behalf. 6. Skin/Wound Care: Routine pressure relief measures.  7. Fluids/Electrolytes/Nutrition: Monitor I/O. Check CMET in am             --Ensure added for malnutrition.  8. Covid PNA: Has completed Paxlovid/decadron to complete tomorrow. Respiratory precautions thru 03/11.              --pulmonary hygiene with flutter valve.  9. Delirium w/agitation/hallucinations: Monitor sleep/wake cycle. Continue Seroquel.  10. Hyponatremia: Na reviewed and decreased to 131 on 3/11, repeat tomorrow.  11. Acute on CKD: SCr was 1.4. Has had left nephrectomy for CA.   --has had worsening of renal status BUN/Scr 22/1.45-->45/1.55 --Encourage fluids. Recheck in am.   12.  Anemia/thrombocytopenia: admission Hgb/Plt-11.2/91 -->12.2/151             --Likely improved due to  hemoconcentration. Recheck in am.  13. CAD  s/p PTCA: On ASA and Lipitor. BB was d/c due to bradycardia. '             --has been off enalapril due to acute on chronic renal failure.  14. Chronic dizziness/Legally blind (macula degeneration): Meclizine prn. --Fall precautions.  15. Leukocytosis: check UA/UC, CXR, procal, lactic acid 16. Cough: CXR and respiratory panel ordered, SLP ordered since worse with eating and drinking.  >50 minutes spent in examination of the patient, review of the chart, cough, ordering chest XR, SLP ordered, respiratory panel ordered, discussed with PA checking UA/UC, procal and lactic acid ordered.   LOS: 3 days A FACE TO FACE EVALUATION WAS PERFORMED  Gilmore List P Wille Aubuchon 02/13/2023, 10:30 AM

## 2023-02-14 LAB — BASIC METABOLIC PANEL
Anion gap: 11 (ref 5–15)
BUN: 31 mg/dL — ABNORMAL HIGH (ref 8–23)
CO2: 25 mmol/L (ref 22–32)
Calcium: 8.3 mg/dL — ABNORMAL LOW (ref 8.9–10.3)
Chloride: 96 mmol/L — ABNORMAL LOW (ref 98–111)
Creatinine, Ser: 1.33 mg/dL — ABNORMAL HIGH (ref 0.61–1.24)
GFR, Estimated: 50 mL/min — ABNORMAL LOW (ref >60)
Glucose, Bld: 156 mg/dL — ABNORMAL HIGH (ref 70–99)
Potassium: 4.6 mmol/L (ref 3.5–5.1)
Sodium: 132 mmol/L — ABNORMAL LOW (ref 135–145)

## 2023-02-14 LAB — CBC WITH DIFFERENTIAL/PLATELET
Abs Immature Granulocytes: 0.13 10*3/uL — ABNORMAL HIGH (ref 0.00–0.07)
Basophils Absolute: 0 10*3/uL (ref 0.0–0.1)
Basophils Relative: 0 %
Eosinophils Absolute: 0.2 10*3/uL (ref 0.0–0.5)
Eosinophils Relative: 2 %
HCT: 39.7 % (ref 39.0–52.0)
Hemoglobin: 13.4 g/dL (ref 13.0–17.0)
Immature Granulocytes: 1 %
Lymphocytes Relative: 6 %
Lymphs Abs: 0.7 10*3/uL (ref 0.7–4.0)
MCH: 30.9 pg (ref 26.0–34.0)
MCHC: 33.8 g/dL (ref 30.0–36.0)
MCV: 91.5 fL (ref 80.0–100.0)
Monocytes Absolute: 0.8 10*3/uL (ref 0.1–1.0)
Monocytes Relative: 7 %
Neutro Abs: 9.5 10*3/uL — ABNORMAL HIGH (ref 1.7–7.7)
Neutrophils Relative %: 84 %
Platelets: 190 10*3/uL (ref 150–400)
RBC: 4.34 MIL/uL (ref 4.22–5.81)
RDW: 13 % (ref 11.5–15.5)
WBC: 11.3 10*3/uL — ABNORMAL HIGH (ref 4.0–10.5)
nRBC: 0 % (ref 0.0–0.2)

## 2023-02-14 MED ORDER — CEPHALEXIN 250 MG PO CAPS
500.0000 mg | ORAL_CAPSULE | Freq: Two times a day (BID) | ORAL | Status: AC
  Administered 2023-02-14 – 2023-02-20 (×14): 500 mg via ORAL
  Filled 2023-02-14 (×14): qty 2

## 2023-02-14 NOTE — Progress Notes (Signed)
Occupational Therapy Session Note  Patient Details  Name: Donald Todd MRN: [id number] Date of Birth: Aug 31, 1929  Today's Date: 02/14/2023 OT Individual Time: [id number] OT Individual Time Calculation (min): 40 min    Short Term Goals: Week 1:  OT Short Term Goal 1 (Week 1): STG=LTG d/t ELOS  Skilled Therapeutic Interventions/Progress Updates:  Skilled OT intervention completed with focus on ADL retraining and DC planning. Pt received upright in bed, agreeable to session. No pain reported.  Pt presented with a much more alert, and effortful demeanor today; vastly different from yesterdays session. Pt agreeable get dressed but politely declined bathing. Transitioned to EOB with supervision though still required increased time and kyphotic posture throughout. Able to donn shirt with supervision, threaded catheter through the pants, then overall min A for donning pants at the CGA sit > stand level using RW. Able to maintain stance balance during tying of pants with CGA, then CGA short ambulatory transfer to w/c.  Discussed in detail about home set up, with plan to order [facility] for night time toileting for fall prevention and energy conservation. Discussed how he could put it next to the wall, set up his toileting items on night stand as pt's wife is unable to physically assist. Has walk in shower with door, with plan to practice this step in transfer method but did encourage seated bathing for energy conservation as pt already has chair and bench options at home.  Pt remained seated in w/c, with belt alarm on/activated, and with all needs in reach at end of session.   Therapy Documentation Precautions:  Precautions Precautions: Fall Restrictions Weight Bearing Restrictions: No    Therapy/Group: Individual Therapy  Blase Mess, MS, OTR/L  02/14/2023, 1:45 PM

## 2023-02-14 NOTE — Progress Notes (Signed)
Physical Therapy Session Note  Patient Details  Name: Donald Todd MRN: [id number] Date of Birth: [birth year]  Today's Date: 02/14/2023 PT Individual Time: [id number] an 1345-1434 PT Individual Time Calculation (min): 40 min and 49 min  Short Term Goals: Week 1:  PT Short Term Goal 1 (Week 1): STG=LTG  Skilled Therapeutic Interventions/Progress Updates:   Treatment Session 1 Received pt sitting in WC, pt agreeable to PT treatment, and denied any pain during session. Session with emphasis on functional mobility/transfers, generalized strengthening and endurance, dynamic standing balance/coordination, DME for discharge, and gait training. Pt stood from [facility] with RW and CGA/close supervision and ambulated 111f with RW and close supervision to dayroom. Provided pt with rollator and educated on rollator safety including brakes management and importance of pushing rollator against wall prior to sitting. Pt then ambulated 1075fx 1 and 8012f 1 with rollator and CGA fading to close supervision with 1 seated rest break. Discussed DME for discharge and pt reports having 3 wheeled walker without a seat. Recommended rollator for discharge for energy conservation purposes to allow pt a place to sit and rest - discussed having pt's son/daughter assist in getting rollator in/out of car. Pt then performed sit<>stands on Airex 1x5 with BUE support and min A fading to CGA and 1x5 without UE support and min A  - emphasis on quad strength. Pt then ambulated [id number] rollator and CGA/close supervision back to room. Concluded session with pt sitting in WC, needs within reach, and seatbelt alarm on. Safety plan updated.   Treatment Session 2 [facility]. Pt agreeable to PT treatment and denied any pain during session. Session with emphasis on functional mobility/transfers, generalized strengthening and endurance, dynamic standing balance/coordination, and gait training. Pt stood  with rollator and CGA/close supervision and ambulated 150ft25f trials with rollator and CGA to/from main therapy gym. Pt participated in FGA using rollator (see details below) with 2 seated rest breaks during assessment: Patient demonstrates increased fall risk as noted by score of 15/30 on  Functional Gait Assessment.   <22/30 = predictive of falls, <20/30 = fall in 6 months, <18/30 = predictive of falls in PD MCID: 5 points stroke population, 4 points geriatric population (ANPTA Core Set of Outcome Measures for Adults with Neurologic Conditions, 2018) Concluded session with pt sitting in WC, needs within reach, and seatbelt alarm on.   Therapy Documentation Precautions:  Precautions Precautions: Fall Restrictions Weight Bearing Restrictions: No  Therapy/Group: Individual Therapy Donald Todd Alfonse AlpersDPT  02/14/2023, 7:12 AM

## 2023-02-14 NOTE — Evaluation (Signed)
Speech Language Pathology Assessment and Plan  Patient Details  Name: Donald Todd MRN: [id number] Date of Birth: [birth year]  SLP Diagnosis:  (N/A)  Rehab Potential:  (N/A) ELOS: N/A    Today's Date: 02/14/2023 SLP Individual Time: 1015-1040 SLP Individual Time Calculation (min): 25 min   Hospital Problem: Principal Problem:   Debility  Past Medical History:  Past Medical History:  Diagnosis Date   Arthritis    CAD (coronary artery disease)    last cath. 07-09-2010   Cancer [facility])    kidney cancer , prostate cancer    Chronic kidney disease    lt kidnet removed   H/O cardiovascular stress test 08-04-2010   ef 55% NO ISCHEMIA   H/O unilateral nephrectomy    lt. nephrectomy   Heart attack ([place]) 1993   History of kidney stones    Hyperlipemia    Hypertension    renal dopplers 218.13-normal patency   Inguinal hernia 08/01/11   right   Macular degeneration    Prostate cancer [facility])    Prostate disease    Cancer S/P seed implant   Renal mass    Vertigo    Past Surgical History:  Past Surgical History:  Procedure Laterality Date   ANGIOPLASTY  1993   athroscopic knee surgery  2002   right   bone scan  2005   CARDIAC CATHETERIZATION  07-09-2010   normal left ventricular function, coronary obsstructive disease with 20% proximal an 30-40% mid lt. anterior descending narrowing ; widely patent stent in the proximal ramus intermediate vessel; 50-70-% narrowing in the mid circumflex,and widely patent stent  in the rt. coronary    CARDIAC CATHETERIZATION  12-22-2003   widely patent stents with noncritical coronary artery disease and normalLV function   CHOLECYSTECTOMY  05/05/2009   Laparoscopic   colonscopy  2004   with biopsy   CORONARY ANGIOPLASTY WITH STENT PLACEMENT  03-15-1996   pci to rci and ramus branch using j&j [id number] stent,post dialated  at  20atm (rca)  and 14 atm (ramus branch. ef 55%             HERNIA REPAIR  08/01/11   RIH   INGUINAL HERNIA REPAIR Left  03/29/2013   Procedure: HERNIA REPAIR INGUINAL ADULT;  Surgeon: Edward Jolly, MD;  Location: [facility];  Service: General;  Laterality: Left;   INSERTION OF MESH Left 03/29/2013   Procedure: INSERTION OF MESH;  Surgeon: Edward Jolly, MD;  Location: [facility];  Service: General;  Laterality: Left;   JOINT REPLACEMENT  2006 and 2010   left 2006, right 2010   KIDNEY SURGERY  2002 and 2005   2002 - partial removal of left. 2005 complete removal   PROSTATE BIOPSY     prostate seed implant     radiation treatment  2002   prostate   REVERSE SHOULDER ARTHROPLASTY Left 05/26/2017   Procedure: REVERSE LEFT TOTAL SHOULDER ARTHROPLASTY;  Surgeon: Netta Cedars, MD;  Location: [place];  Service: Orthopedics;  Laterality: Left;   stent implants  1997    Assessment / Plan / Recommendation Clinical Impression Patient Admitting Diagnosis: debility 2/2 covid infection   History of Present Illness: Pt is a 89 y/o male with PMH of renal cell carcinoma with mets to the psoas muscle s/p radiation therapy, stage IIIa CKD, L partial nephrectomy, HTN, macular degeneration, CAD, who presents to [facility] ED on 02/03/23 with 2 day history of sore throat, cough, weakness, and dizziness.  In ED, vitals and  labs relatively unremarkable except platelets 91K, creatinine 1.32 and calcium 8.1.  Pt found to be covid + and was started on paxlovid and decadron taper.  Lovenox held in the setting of thrombocytopenia.  Pt with episode of bradycardia for which cardiology was consulted and they discontinued his beta blocker.  Pt with chronic dizziness, and was started on meclizine PRN.  Hospital course complicated by delirium, pt started on low dose seroquel.  Therapy ongoing and recommendations are for CIR to return to baseline.    Patient's medical record from [facility] has been reviewed by the rehabilitation admission coordinator and physician.  SLP consulted to complete clinical swallow evaluation in the setting of concern for  aspiration given nursing reports of coughing with eating and drinking; admitted secondary to debility 2/2 COVID-19 infection. CXR obtained on 02/13/2023 with no evidence of an acute process. Pt greeted awake/alert and OOB in w/c. Agreeable to evaluation.  Per clinical observation, pt presents with grossly functional deglutition for pt's given age. OME unremarkable, with the exception of lingual tremors that were noted with lateral and vertical movements.   SLP administered trials of thin liquid via cup and straw (single and sequential sips), puree, and hard solid texture (saltine with peanut butter). Pt only exhibited intermittent, subtle throat clear following sequential straw sips; throat clear effective in returning vocal quality to baseline quality (normal/clear/dry). Pt implemented general aspiration precautions at the Mod I level. Provided general education/reinforcement re: general aspiration precautions (small + single bites/sips, use of cup vs straw, slow rate, upright positioning (EOB or OOB for meals preferred - as able), only eating when fully awake/alert); pt verbalized understanding.   Denies hx of PNA, pulmonary infections, and need for modified diet, though does endorse that he has been preferring more smooth, easy to swallow foods lately and will supplement with nutritional shakes, as needed. Reports intermittent acid reflux that he treats with OTC medication PRN.  Currently, pt is ambulatory with assistive device and able to self-feed (though does endorse some difficulty due to visual deficits) and perform oral hygiene with min level of assistance, per OT evaluation on 02/11/2023. Informally, cognition appears grossly intact.  Given clinical presentation and pt report, skilled ST intervention does not appear indicated at this time. Deglutition supports recommendation to remain on current diet with medications crushed or whole with puree or liquid, per pt preference, with adherence to general  aspiration precautions outlined above. Results and recommendations were reviewed with pt who verbalized understanding and agreement of findings.    Skilled Therapeutic Interventions          CSE and OME administered. Please see full report for details.  SLP Assessment  Patient does not need any further Speech Lanaguage Pathology Services    Recommendations  Recommended Consults:  (N/A) SLP Diet Recommendations: Age appropriate regular solids;Thin Liquid Administration via: Cup;Straw Medication Administration: Crushed with puree (per pt request) Supervision: Patient able to self feed Compensations: Slow rate;Small sips/bites Postural Changes and/or Swallow Maneuvers: Seated upright 90 degrees;Out of bed for meals;Upright 30-60 min after meal Oral Care Recommendations: Oral care BID Patient destination: Home Follow up Recommendations: None Equipment Recommended: None recommended by SLP    SLP Frequency  (N/A)   SLP Duration  SLP Intensity  SLP Treatment/Interventions N/A   (N/A)   (N/A)    Pain Pain Assessment Pain Scale: 0-10 Pain Score: 0-No pain  Prior Functioning Cognitive/Linguistic Baseline: Information not available Type of Home: House  Lives With: Spouse Available Help at Discharge: Family;Available  24 hours/day  SLP Evaluation Cognition Overall Cognitive Status: Within Functional Limits for tasks assessed Arousal/Alertness: Awake/alert Orientation Level: Oriented X4  Comprehension Auditory Comprehension Overall Auditory Comprehension: Appears within functional limits for tasks assessed Expression Expression Primary Mode of Expression: Verbal Verbal Expression Overall Verbal Expression: Appears within functional limits for tasks assessed Oral Motor Oral Motor/Sensory Function Overall Oral Motor/Sensory Function: Within functional limits (lingual tremors noted during OME - with movement) Motor Speech Overall Motor Speech: Appears within functional limits  for tasks assessed Motor Planning: Witnin functional limits  Care Tool Care Tool Cognition Ability to hear (with hearing aid or hearing appliances if normally used Ability to hear (with hearing aid or hearing appliances if normally used): 1. Minimal difficulty - difficulty in some environments (e.g. when person speaks softly or setting is noisy)   Expression of Ideas and Wants Expression of Ideas and Wants: 4. Without difficulty (complex and basic) - expresses complex messages without difficulty and with speech that is clear and easy to understand   Understanding Verbal and Non-Verbal Content Understanding Verbal and Non-Verbal Content: 4. Understands (complex and basic) - clear comprehension without cues or repetitions  Memory/Recall Ability Memory/Recall Ability : Current season;That he or she is in a hospital/hospital unit    Bedside Swallowing Assessment General Date of Onset: 02/14/23 Previous Swallow Assessment: None on file Diet Prior to this Study: Regular;Thin liquids (Level 0) Temperature Spikes Noted: No Respiratory Status: Room air History of Recent Intubation: No Behavior/Cognition: Alert;Cooperative;Pleasant mood Oral Cavity - Dentition: Missing dentition Self-Feeding Abilities: Able to feed self Patient Positioning: Upright in chair/Tumbleform Baseline Vocal Quality: Normal Volitional Cough: Weak Volitional Swallow: Able to elicit  Ice Chips Ice chips: Not tested Thin Liquid Thin Liquid: Impaired Presentation: Cup;Straw Pharyngeal  Phase Impairments: Throat Clearing - Immediate (occured with straw only and sequential vs single sips) Nectar Thick Nectar Thick Liquid: Not tested Honey Thick Honey Thick Liquid: Not tested Puree Puree: Within functional limits Presentation: Self Fed;Spoon Solid Solid: Within functional limits Presentation: Self Fed BSE Assessment Suspected Esophageal Findings Suspected Esophageal Findings:  (None) Risk for Aspiration Impact  on safety and function: Mild aspiration risk Other Related Risk Factors: Deconditioning  Short Term Goals: N/A  Recommendations for other services: None   Discharge Criteria: Patient will be discharged from SLP if patient refuses treatment 3 consecutive times without medical reason, if treatment goals not met, if there is a change in medical status, if patient makes no progress towards goals or if patient is discharged from hospital.  The above assessment, treatment plan, treatment alternatives and goals were discussed and mutually agreed upon: by patient  Romelle Starcher A Arriana Lohmann 02/14/2023, 11:07 AM

## 2023-02-14 NOTE — Progress Notes (Signed)
PROGRESS NOTE   Subjective/Complaints: No new complaints this morning UA positive, procal elevated, started on antibiotic CXR stable  ROS: +cough, +visual deficits   Objective:   DG Chest 2 View  Result Date: 02/13/2023 CLINICAL DATA:  Cough for several days. History of CAD and hypertension. EXAM: CHEST - 2 VIEW COMPARISON:  Radiographs 02/03/2023.  CT 02/01/2023. FINDINGS: 1237 hours. The heart size and mediastinal contours are stable with aortic atherosclerosis. Stable low lung volumes with mild right basilar atelectasis. No edema, confluent airspace opacity, pleural effusion or pneumothorax. No acute osseous findings are evident. There are degenerative changes in the spine associated with a convex right thoracolumbar scoliosis. Previous left shoulder reverse arthroplasty. IMPRESSION: No evidence of acute cardiopulmonary process. Aortic atherosclerosis. Electronically Signed   By: Richardean Sale M.D.   On: 02/13/2023 13:38   Recent Labs    02/13/23 0651 02/14/23 1055  WBC 20.0* 11.3*  HGB 12.8* 13.4  HCT 37.0* 39.7  PLT 161 190   Recent Labs    02/13/23 0651  NA 131*  K 4.6  CL 100  CO2 22  GLUCOSE 104*  BUN 33*  CREATININE 1.49*  CALCIUM 8.1*    Intake/Output Summary (Last 24 hours) at 02/14/2023 1127 Last data filed at 02/14/2023 1003 Gross per 24 hour  Intake 656 ml  Output 600 ml  Net 56 ml        Physical Exam: Vital Signs Blood pressure 131/83, pulse 99, temperature 97.9 F (36.6 C), resp. rate 16, height '5\' 7"'$  (1.702 m), weight 56.8 kg, SpO2 99 %. Gen: no distress, normal appearing HEENT: oral mucosa pink and moist, NCAT Eyes:     Extraocular Movements: Extraocular movements intact.     Pupils: Pupils are equal, round, and reactive to light.     Comments: Vision poor  Cardiovascular:     Rate and Rhythm: Normal rate and regular rhythm.  Pulmonary:     Effort: Pulmonary effort is normal. No  respiratory distress.     Breath sounds: Rhonchi present.     Comments: Intermittent cough Abdominal:     General: Bowel sounds are normal.     Palpations: Abdomen is soft.  Musculoskeletal:        General: No swelling or tenderness.     Cervical back: Normal range of motion.  Skin:    General: Skin is warm.     Findings: Bruising present.  Neurological:     Mental Status: He is alert.     Comments: Alert and oriented x 3. Normal insight and awareness. Intact Memory. Normal language and speech. Cranial nerve exam unremarkable. UE 5/5 prox to distal. LE: 4- HF, KE and 4+/5 ADF/PF. Sensory exam normal for light touch and pain in all 4 limbs. No limb ataxia or cerebellar signs. No abnormal tone appreciated.   Ambulating with AD Psychiatric:        Mood and Affect: Mood normal.        Behavior: Behavior normal.    Assessment/Plan: 1. Functional deficits which require 3+ hours per day of interdisciplinary therapy in a comprehensive inpatient rehab setting. Physiatrist is providing close team supervision and 24 hour management of active medical  problems listed below. Physiatrist and rehab team continue to assess barriers to discharge/monitor patient progress toward functional and medical goals  Care Tool:  Bathing    Body parts bathed by patient: Right arm, Left arm, Chest, Abdomen, Front perineal area, Buttocks, Right upper leg, Left upper leg, Right lower leg, Left lower leg, Face         Bathing assist Assist Level: Minimal Assistance - Patient > 75%     Upper Body Dressing/Undressing Upper body dressing   What is the patient wearing?: Pull over shirt    Upper body assist Assist Level: Supervision/Verbal cueing    Lower Body Dressing/Undressing Lower body dressing      What is the patient wearing?: Pants     Lower body assist Assist for lower body dressing: Minimal Assistance - Patient > 75%     Toileting Toileting    Toileting assist Assist for toileting: Minimal  Assistance - Patient > 75%     Transfers Chair/bed transfer  Transfers assist     Chair/bed transfer assist level: Minimal Assistance - Patient > 75%     Locomotion Ambulation   Ambulation assist      Assist level: Minimal Assistance - Patient > 75%   Max distance: 150f   Walk 10 feet activity   Assist     Assist level: Minimal Assistance - Patient > 75% Assistive device: Walker-rolling   Walk 50 feet activity   Assist    Assist level: Minimal Assistance - Patient > 75% Assistive device: Walker-rolling    Walk 150 feet activity   Assist    Assist level: Minimal Assistance - Patient > 75% Assistive device: Walker-rolling    Walk 10 feet on uneven surface  activity   Assist Walk 10 feet on uneven surfaces activity did not occur: Safety/medical concerns         Wheelchair     Assist Is the patient using a wheelchair?: No             Wheelchair 50 feet with 2 turns activity    Assist            Wheelchair 150 feet activity     Assist          Blood pressure 131/83, pulse 99, temperature 97.9 F (36.6 C), resp. rate 16, height '5\' 7"'$  (1.702 m), weight 56.8 kg, SpO2 99 %.  Medical Problem List and Plan: 1. Functional deficits secondary to debility related to COVID and multiple medical issues             -patient may shower             -ELOS/Goals: 10-12 days, supervision to mod I goals with PT, OT  Continue CIR 2.  Antithrombotics: -DVT/anticoagulation:  Pharmaceutical: Lovenox--discussed with pt the need for lovenox injections today.              -antiplatelet therapy: ASA.  3. Pain Management: Tylenol prn 4. Mood/Behavior/Sleep: LCSW to follow for evaluation and suppor.t              -antipsychotic agents: continue Seroquel at bedtime.  5. Neuropsych/cognition: This patient is capable of making decisions on his own behalf. 6. Skin/Wound Care: Routine pressure relief measures.  7. Fluids/Electrolytes/Nutrition:  Monitor I/O. Check CMET in am             --Ensure added for malnutrition.  8. Covid PNA: Has completed Paxlovid/decadron to complete tomorrow. Respiratory precautions thru 03/11.              --  pulmonary hygiene with flutter valve.  9. Delirium w/agitation/hallucinations: Monitor sleep/wake cycle. Continue Seroquel.  10. Hyponatremia: Na reviewed and decreased to 131 on 3/11, repeat tomorrow.  11. Acute on CKD: SCr was 1.4. Has had left nephrectomy for CA.   --has had worsening of renal status BUN/Scr 22/1.45-->45/1.55 --Encourage fluids. Recheck in am.   12.  Anemia/thrombocytopenia: admission Hgb/Plt-11.2/91 -->12.2/151             --Likely improved due to hemoconcentration. Recheck in am.  53. CAD s/p PTCA: On ASA and Lipitor. BB was d/c due to bradycardia. '             --has been off enalapril due to acute on chronic renal failure.  14. Chronic dizziness/Legally blind (macula degeneration): continue Meclizine prn. --Fall precautions.  15. Leukocytosis: UA positive, started on keflex 16. Cough: CXR ordered and shows atelectasis, SLP ordered since worse with eating and drinking. 17. Atelectasis: Recommend  incentive spirometer to improve breathing strength and ability over time. .     LOS: 4 days A FACE TO FACE EVALUATION WAS PERFORMED  Donald Todd 02/14/2023, 11:27 AM

## 2023-02-15 NOTE — Progress Notes (Signed)
Physical Therapy Session Note  Patient Details  Name: Donald Todd MRN: [id number] Date of Birth: [birth year]  Today's Date: 02/15/2023 PT Individual Time: [id number] PT Individual Time Calculation (min): 33 min   Short Term Goals: Week 1:  PT Short Term Goal 1 (Week 1): STG=LTG  Skilled Therapeutic Interventions/Progress Updates:      Therapy Documentation Precautions:  Precautions Precautions: Fall Restrictions Weight Bearing Restrictions: No  Pt received semi-reclined in bed and agreeable to additional PT session to make up missed minutes from previous session. Pt with 4/10 dorsal foot pain, pre-medicated and provided rest breaks for relief. Pt supervision for supine to sit and min A with stand pivot transfer no AD to w/c. Pt transported total A for time management and energy conservation to main gym and requires min A for ambulatory transfer to kinetron. Pt attempted propulsion on Kinetron in standing but reports its aggravates foot pain, deferred to exercise in sitting x 8 minutes at 40 cm/second. Pt transported to room and requires min A with stand pivot and supervision for sit to lying. Pt left semi-reclined in bed with all needs in reach and alarm on.    Therapy/Group: Individual Therapy  Verl Dicker Verl Dicker PT, DPT  02/15/2023, 3:28 PM

## 2023-02-15 NOTE — Progress Notes (Signed)
Occupational Therapy Session Note  Patient Details  Name: Donald Todd MRN: [id number] Date of Birth: Sep 23, 1929  Today's Date: 02/15/2023 OT Individual Time: [id number] OT Individual Time Calculation (min): 72 min    Short Term Goals: Week 1:  OT Short Term Goal 1 (Week 1): STG=LTG d/t ELOS  Skilled Therapeutic Interventions/Progress Updates:  Skilled OT intervention completed with focus on functional endurance and ADL retraining within a shower context. Pt received upright in bed, agreeable to session. No pain reported.  With convincing, as pt felt "not ready strength wise," pt agreeable to shower. Completed bed mobility with supervision to EOB. Supervision sit > stand then ambulated with CGA using rollator to toilet in bathroom with cues needed for proximity of his body to rollator vs pushing out far in front of him especially over bathroom threshold. Foley noted to have leaked in brief at the insertion point as well as active dripping into commode. Continent of small BM; nurse notified of both for foley care.  CGA sit > stand using grab bar, able to wipe with supervision, then CGA ambulatory transfer with rollator to shower. Max cues for hand positioning on grab bar to side step into shower. Able to shower with supervision using LH sponge, however CGA needed to stand using grab bar and min A for cleaning bottom for efficiency. Cues needed throughout for accessing all areas and sequencing however suspect this routine is unfamiliar to pt in this environment. CGA stand pivot to w/c.   Able to donn deo/shirt with supervision with time. Education provided on reacher and sock aid for threading LB dressing with overall min A needed for technique of both due to low vision strategies due to macular degeneration. Assist needed for foley threading, with bag emptied prior to threading (1100 ml). CGA sit > stand and able to donn over hips with supervision. Set up A for hair grooming.  Pt remained  seated in w/c, with belt alarm on/activated, and with all needs in reach at end of session.   Therapy Documentation Precautions:  Precautions Precautions: Fall Restrictions Weight Bearing Restrictions: No    Therapy/Group: Individual Therapy  Blase Mess, MS, OTR/L  02/15/2023, 3:59 PM

## 2023-02-15 NOTE — Progress Notes (Addendum)
Patient ID: Donald Todd, male   DOB: Oct 07, 1929, 89 y.o.   MRN: [id number]  [facility] referral sent to [facility] Patient approved PT OT Orders Sent.

## 2023-02-15 NOTE — Progress Notes (Signed)
PROGRESS NOTE   Subjective/Complaints: No new complaints this morning Using incentive spirometer Dizziness- not using prn meclizine Cough improved  ROS: +cough, +visual deficits, +dizziness   Objective:   DG Chest 2 View  Result Date: 02/13/2023 CLINICAL DATA:  Cough for several days. History of CAD and hypertension. EXAM: CHEST - 2 VIEW COMPARISON:  Radiographs 02/03/2023.  CT 02/01/2023. FINDINGS: 1237 hours. The heart size and mediastinal contours are stable with aortic atherosclerosis. Stable low lung volumes with mild right basilar atelectasis. No edema, confluent airspace opacity, pleural effusion or pneumothorax. No acute osseous findings are evident. There are degenerative changes in the spine associated with a convex right thoracolumbar scoliosis. Previous left shoulder reverse arthroplasty. IMPRESSION: No evidence of acute cardiopulmonary process. Aortic atherosclerosis. Electronically Signed   By: Richardean Sale M.D.   On: 02/13/2023 13:38   Recent Labs    02/13/23 0651 02/14/23 1055  WBC 20.0* 11.3*  HGB 12.8* 13.4  HCT 37.0* 39.7  PLT 161 190   Recent Labs    02/13/23 0651 02/14/23 1055  NA 131* 132*  K 4.6 4.6  CL 100 96*  CO2 22 25  GLUCOSE 104* 156*  BUN 33* 31*  CREATININE 1.49* 1.33*  CALCIUM 8.1* 8.3*    Intake/Output Summary (Last 24 hours) at 02/15/2023 1141 Last data filed at 02/14/2023 1327 Gross per 24 hour  Intake 420 ml  Output --  Net 420 ml        Physical Exam: Vital Signs Blood pressure 122/79, pulse 87, temperature 98.2 F (36.8 C), resp. rate 18, height '5\' 7"'$  (1.702 m), weight 56.8 kg, SpO2 99 %. Gen: no distress, normal appearing HEENT: oral mucosa pink and moist, NCAT Eyes:     Extraocular Movements: Extraocular movements intact.     Pupils: Pupils are equal, round, and reactive to light.     Comments: Vision poor  Cardiovascular:     Rate and Rhythm: Normal rate and  regular rhythm.  Pulmonary:     Effort: Pulmonary effort is normal. No respiratory distress.     Breath sounds: Rhonchi present.     Comments: Intermittent cough Abdominal:     General: Bowel sounds are normal.     Palpations: Abdomen is soft.  Musculoskeletal:        General: No swelling or tenderness.     Cervical back: Normal range of motion. Protracted posture Skin:    General: Skin is warm.     Findings: Bruising present.  Neurological:     Mental Status: He is alert.     Comments: Alert and oriented x 3. Normal insight and awareness. Intact Memory. Normal language and speech. Cranial nerve exam unremarkable. UE 5/5 prox to distal. LE: 4- HF, KE and 4+/5 ADF/PF. Sensory exam normal for light touch and pain in all 4 limbs. No limb ataxia or cerebellar signs. No abnormal tone appreciated.   Ambulating with AD Psychiatric:        Mood and Affect: Mood normal.        Behavior: Behavior normal.    Assessment/Plan: 1. Functional deficits which require 3+ hours per day of interdisciplinary therapy in a comprehensive inpatient rehab setting. Physiatrist  is providing close team supervision and 24 hour management of active medical problems listed below. Physiatrist and rehab team continue to assess barriers to discharge/monitor patient progress toward functional and medical goals  Care Tool:  Bathing    Body parts bathed by patient: Right arm, Left arm, Chest, Abdomen, Front perineal area, Right upper leg, Left upper leg, Right lower leg, Left lower leg, Face   Body parts bathed by helper: Buttocks     Bathing assist Assist Level: Minimal Assistance - Patient > 75%     Upper Body Dressing/Undressing Upper body dressing   What is the patient wearing?: Pull over shirt    Upper body assist Assist Level: Supervision/Verbal cueing    Lower Body Dressing/Undressing Lower body dressing      What is the patient wearing?: Pants, Incontinence brief     Lower body assist Assist  for lower body dressing: Minimal Assistance - Patient > 75%     Toileting Toileting    Toileting assist Assist for toileting: Contact Guard/Touching assist     Transfers Chair/bed transfer  Transfers assist     Chair/bed transfer assist level: Supervision/Verbal cueing     Locomotion Ambulation   Ambulation assist      Assist level: Supervision/Verbal cueing Assistive device: Rollator Max distance: 153f   Walk 10 feet activity   Assist     Assist level: Supervision/Verbal cueing Assistive device: Rollator   Walk 50 feet activity   Assist    Assist level: Supervision/Verbal cueing Assistive device: Rollator    Walk 150 feet activity   Assist    Assist level: Supervision/Verbal cueing Assistive device: Rollator    Walk 10 feet on uneven surface  activity   Assist Walk 10 feet on uneven surfaces activity did not occur: Safety/medical concerns         Wheelchair     Assist Is the patient using a wheelchair?: No             Wheelchair 50 feet with 2 turns activity    Assist            Wheelchair 150 feet activity     Assist          Blood pressure 122/79, pulse 87, temperature 98.2 F (36.8 C), resp. rate 18, height '5\' 7"'$  (1.702 m), weight 56.8 kg, SpO2 99 %.  Medical Problem List and Plan: 1. Functional deficits secondary to debility related to COVID and multiple medical issues             -patient may shower             -ELOS/Goals: 10-12 days, supervision to mod I goals with PT, OT  Continue CIR 2.  Antithrombotics: -DVT/anticoagulation:  Pharmaceutical: Lovenox--discussed with pt the need for lovenox injections today.              -antiplatelet therapy: ASA.  3. Pain Management: Tylenol prn 4. Mood/Behavior/Sleep: LCSW to follow for evaluation and suppor.t              -antipsychotic agents: continue Seroquel at bedtime.  5. Neuropsych/cognition: This patient is capable of making decisions on his own  behalf. 6. Skin/Wound Care: Routine pressure relief measures.  7. Fluids/Electrolytes/Nutrition: Monitor I/O. Check CMET in am             --Ensure added for malnutrition.  8. Covid PNA: Has completed Paxlovid/decadron to complete tomorrow. Respiratory precautions thru 03/11.              --  pulmonary hygiene with flutter valve.  9. Delirium w/agitation/hallucinations: Monitor sleep/wake cycle. Continue Seroquel.  10. Hyponatremia: Na reviewed and decreased to 131 on 3/11, repeat tomorrow.  11. Acute on CKD: SCr was 1.4. Has had left nephrectomy for CA.   --has had worsening of renal status BUN/Scr 22/1.45-->45/1.55 --Encourage fluids. Recheck in am.   12.  Anemia/thrombocytopenia: admission Hgb/Plt-11.2/91 -->12.2/151             --Likely improved due to hemoconcentration. Recheck in am.  9. CAD s/p PTCA: Continue ASA and Lipitor. BB was d/c due to bradycardia. '             --has been off enalapril due to acute on chronic renal failure.  14. Chronic dizziness/Legally blind (macula degeneration): continue Meclizine prn. Will educate patient that we can try this medicine --Fall precautions.  15. Leukocytosis: UA positive, started on keflex. Monitor weekly.  16. Cough: CXR ordered and shows atelectasis, SLP ordered since worse with eating and drinking. Cleared by SLP 17. Atelectasis: Recommend  incentive spirometer to improve breathing strength and ability over time. .  18. Urinary retention: will discuss foley removal.     LOS: 5 days A FACE TO FACE EVALUATION WAS PERFORMED  Clide Deutscher Charolett Yarrow 02/15/2023, 11:41 AM

## 2023-02-15 NOTE — Progress Notes (Signed)
Physical Therapy Session Note  Patient Details  Name: Donald Todd MRN: [id number] Date of Birth: 01/05/1929  Today's Date: 02/15/2023 PT Individual Time: 0932-1010 PT Individual Time Calculation (min): 38 min  and Today's Date: 02/15/2023 PT Missed Time: 22 Minutes Missed Time Reason: Other (Comment) (dizziness)  Short Term Goals: Week 1:  PT Short Term Goal 1 (Week 1): STG=LTG  Skilled Therapeutic Interventions/Progress Updates:   Pt seated in WC upon arrival.Pt agreeable to treatment. Pt reports 3/10 pain in L foot and 5/10 dizziness after ambulating 150 feet from room to day room with rollator and supervision. Pt seated BP 118/85 and HR 90. Assessed pt's L foot, pain on navicular and dorsal aspect of 2nd and 3rd digit upon palpation, skin inspection unremarkable. Pt performed 2x5 STS with B UE support and min A. Pt continued to complain of dizziness and requested to go back to room. Pt transported to room dependent in [facility] due to dizziness. Pt performed stand pivot transfer WC to bed with CGA for safety. Education provided throughout session regarding purpose of balance activities and potential causes of dizziness (impaired vision, and decreased tolerance to activity). Pt in bed at end of session with needs within reach. Notified nursing of pts position and dizziness.  22 minutes missed of skilled PT intervention due to dizziness and pt requesting to return to bed.     Therapy Documentation Precautions:  Precautions Precautions: Fall Restrictions Weight Bearing Restrictions: No    Therapy/Group: Individual Therapy  [facility] [place], [place], DPT  02/15/2023, 10:15 AM

## 2023-02-15 NOTE — Patient Care Conference (Signed)
Inpatient RehabilitationTeam Conference and Plan of Care Update Date: 02/15/2023   Time: 11:41 AM    Patient Name: Donald Todd      Medical Record Number: [id number]  Date of Birth: [birth year] Sex: Male         Room/Bed: 4W23C/4W23C-01 Payor Info: Payor: MEDICARE / Plan: MEDICARE PART A AND B / Product Type: *No Product type* /    Admit Date/Time:  02/10/2023 12:02 PM  Primary Diagnosis:  [facility] Problems: Principal Problem:   Debility    Expected Discharge Date: Expected Discharge Date: 02/21/23  Team Members Present: Physician leading conference: Dr. Leeroy Cha Social Worker Present: Erlene Quan, BSW Nurse Present: Dorien Chihuahua, RN PT Present: Becky Sax, PT OT Present: Jennefer Bravo, OT PPS Coordinator present : Gunnar Fusi, SLP     Current Status/Progress Goal Weekly Team Focus  Bowel/Bladder   foley, continent of bowel   promote bladder function   Assist with tolieting qshift and prn    Swallow/Nutrition/ Hydration               ADL's   Mod A bathing, Supervision UB dressing, Min A LB, mod A toileting   Supervision   cardiorespiratory/vascular endurance, balance, BUE strength, ADL retraining, DME education    Mobility   bed mobility supervision/CGA, transfers with RW min A, gait 112f with RW and min A, 8 3in steps with 2 handrails and min A and 4 6in steps with 2 handrails and mod A   mod I transfers, supervision gait and stairs  functional mobility/transfers, generalized strengthening and endurance, dynamic standing balance/coordination, gait training, DME, and D/C planning    Communication                Safety/Cognition/ Behavioral Observations               Pain   no c/o pain   remain pain free   Assess qshift and prn    Skin   skin intact   remain intact  Assess qshift and prn      Discharge Planning:  Discharging home with spouse who uses rollator. Supervision only, daughter lives near and able to  assist if needed. [facility]   Team Discussion: Patient with debility; currently with leukocytosis post COVID. Follow up CXR; encourage use of I.S. Foley placed 02/12/23 by urology for urinary retention. Dizziness treated with prn meclizine complicated by low vision and balance issues.  Patient on target to meet rehab goals: yes, currently needs CGA - supervision overall. Transfers with a rollator with close supervision. Able to ambulate up to 150' and manage 8 steps with bilateral rails. Needs min assist for lower body bathing and supervision for upper body care with mod assist for toileting.  Required constant cues for showering. Goals for discharge set for supervision - mod I assist overall.  *See Care Plan and progress notes for long and short-term goals.   Revisions to Treatment Plan:  N/a   Teaching Needs: Safety, medications, foley care/toileting, transfers, etc.   Current Barriers to Discharge: Decreased caregiver support, Home enviroment access/layout, and Neurogenic bowel and bladder  Possible Resolutions to Barriers: Family education HH follow up services DME: Rollator, 3N1     Medical Summary Current Status: cough, COVID, urinary retention, UTI,dizziness  Barriers to Discharge: Medical stability  Barriers to Discharge Comments: cough, COVID, urinary retention, UTI, dizziness Possible Resolutions to [employer] continue incentive spirometer, continue keflex, continue claritin,  continue prn meclizine, CXR reviewed and shows atelectasis   Continued Need for Acute Rehabilitation Level of Care: The patient requires daily medical management by a physician with specialized training in physical medicine and rehabilitation for the following reasons: Direction of a multidisciplinary physical rehabilitation program to maximize functional independence : Yes Medical management of patient stability for increased activity  during participation in an intensive rehabilitation regime.: Yes Analysis of laboratory values and/or radiology reports with any subsequent need for medication adjustment and/or medical intervention. : Yes   I attest that I was present, lead the team conference, and concur with the assessment and plan of the team.   Dorien Chihuahua B 02/15/2023, 1:40 PM

## 2023-02-15 NOTE — Progress Notes (Signed)
Occupational Therapy Session Note  Patient Details  Name: Donald Todd MRN: [id number] Date of Birth: 1929/12/03  Today's Date: 02/15/2023 OT Individual Time: 1330-1430 OT Individual Time Calculation (min): 60 min    Short Term Goals: Week 1:  OT Short Term Goal 1 (Week 1): STG=LTG d/t ELOS Week 2:     Skilled Therapeutic Interventions/Progress Updates: 1:1 Pt received in bed. Reported not feeling well this morning and wasn't able to do much in PT but is up for trying to get up and moving this afternoon. Pt able to come to EOB with supervision and don shoes with setup . Pt ambulated from room to dayroom with Rollator with supervision. Pt performed Nustep exercise on resistance level 3 for 15 min without break. No reports of feeling bad this afternoon. Pt ambulated back to room again with supervision with Rollator. Pt performed toileting in the bathroom with min A for checking for cleanliness after BM. Pt able to perform clothing management. Pt left sitting up in the w/c with shoes doffed- reported his foot was was hurting on the left.      Therapy Documentation Precautions:  Precautions Precautions: Fall Restrictions Weight Bearing Restrictions: No  Pain: C/o pain in left foot especially with weight bearing - pt reports feeling tight in shoe   Therapy/Group: Individual Therapy  Willeen Cass [facility] 02/15/2023, 2:23 PM

## 2023-02-15 NOTE — Progress Notes (Signed)
Patient ID: Donald Todd, male   DOB: [birth year], 89 y.o.   MRN: [id number]  Rollator and BSC ordered through [facility].

## 2023-02-15 NOTE — Progress Notes (Signed)
Patient ID: Donald Todd, male   DOB: [birth year], 89 y.o.   MRN: [id number]  Team Conference Report to Patient/Family  Team Conference discussion was reviewed with the patient and caregiver, including goals, any changes in plan of care and target discharge date.  Patient and caregiver express understanding and are in agreement.  The patient has a target discharge date of 02/21/23.   SW met with patient and provided team conference updates. SW made attempt to reach spouse at bedside, left a detailed VM. Patient reports cough and dizziness has improved, no additional questions or concerns.  Dyanne Iha 02/15/2023, 1:29 PM

## 2023-02-16 MED ORDER — CHLORHEXIDINE GLUCONATE CLOTH 2 % EX PADS
6.0000 | MEDICATED_PAD | Freq: Two times a day (BID) | CUTANEOUS | Status: DC
  Administered 2023-02-16 – 2023-02-19 (×7): 6 via TOPICAL

## 2023-02-16 MED ORDER — TAMSULOSIN HCL 0.4 MG PO CAPS
0.4000 mg | ORAL_CAPSULE | Freq: Every day | ORAL | Status: DC
  Administered 2023-02-16 – 2023-02-20 (×5): 0.4 mg via ORAL
  Filled 2023-02-16 (×5): qty 1

## 2023-02-16 NOTE — Progress Notes (Signed)
Physical Therapy Session Note  Patient Details  Name: ANTIWAN SOBALVARRO MRN: [id number] Date of Birth: 1929-10-08  Today's Date: 02/16/2023 PT Individual Time: 0815-0910 PT Individual Time Calculation (min): 55 min   Short Term Goals: Week 1:  PT Short Term Goal 1 (Week 1): STG=LTG  Skilled Therapeutic Interventions/Progress Updates:    Chart reviewed and pt agreeable to therapy. Pt received seated in WC with no c/o pain. Session focused on activity tolerance and stair navigation to promote safe home and community acces. Pt initiated session with amb of 174f to therapy gym using supervision + rollator. Pt then completed 12 mins on NuStep for interval training between workloads of 4-8, with pt displaying good ability to tolerate activity length and changes in intensity. Pt then amb 1060fto stairs using supervision + rollator. At small stairs, pt completed 2x10 steps up/down on first step followed by 2x8 steps with B rails, all using CGA + B rails. Pt noted to use alt step pattern to ascending, but step-to pattern for descent. Pt then transferred to large steps and completed 1x8 reps bilaterally of step up down in side-step position using CGA + rail to promote strength for descending larger steps. Pt then returned to room using supervision + rollator. At end of session, [facility], nurse call bell and all needs in reach.     Therapy Documentation Precautions:  Precautions Precautions: Fall Restrictions Weight Bearing Restrictions: No    Therapy/Group: Individual Therapy  KiLoleta BooksDPT 02/16/2023, 9:15 AM

## 2023-02-16 NOTE — Progress Notes (Signed)
Physical Therapy Session Note  Patient Details  Name: Donald Todd MRN: [id number] Date of Birth: 07-24-1929  Today's Date: 02/16/2023 PT Individual Time: [id number]; [id number] PT Individual Time Calculation (min): 55 min; 40 min  Short Term Goals: Week 1:  PT Short Term Goal 1 (Week 1): STG=LTG  Skilled Therapeutic Interventions/Progress Updates:  AM Session:  Patient received in [facility] in room, resting with no signs of distress, pt reports some mild low back discomfort and shoulder stiffness/soreness but does not rate. Pt completed sit<>stand from [facility] to rollator with CGA and ambulated~ 150' to main gym with CGA; pt stopped in gym for seated rest on mat table. Pt then ambulated additional distance ~140' to ortho gym with rollator and CGA, seated rest on mat table in ortho gym for energy conservation. Pt able to stand with supervision to rollator for ambulatory transfer to UBE seat, CGA to turn and step to sit on seat. Pt performed 3 minutes on UBE for UE strengthening, level 1. Following exercises seated rest taken on rollator after SPT with CGA to move off of UBE seat. Pt c/o greater fatigue and ambulatory transfer completed with CGA to move to mat table. Extended rest break provided and pt completed UE strengthening in sitting: - 2x10 reps hammer curl seated in WC, 3lbs on Rt UE, 1lb on Lt UE.  - 2x 10 reps bicep curl bil UE with 3lbs bar (1st underhand grip, 1nd set overhand grip)- pt then did 5 extra underhand grip  SPT mat>WC completed with no AD and Min assist to guide turn; pt dependently rolled back to room in [facility] and stand pivot transfer with no AD and CGA provided to move WC>bed. EOS pt resting in semisupine, alarm on, call bell and all needs within reach.  PM Session: Patient received in bed resting and reports he is feeling better compared to earlier this AM. Pt sat up to EOB from supine with use of bed features and supervision. Min assist to don shoes. Pt completed SPT with min assist  and no AD to pivot bed>WC. Pt dependently rolled to atrium and outdoor patio for outdoor ambulation. Pt completed sit<>stand WC with CGA to Rollator. Patient ambulated 3 bouts with seated rest on outdoor bench after first and seated rest in [facility] after second/third bouts (~80', ~80', ~100'). During gait pt noted stepped Rt LE up and over napkin on the ground to clear obstacle, many other steps with low foot clearance. During first two bouts of gait pt controlled Rollator on gradual up/down gradients with cues from therapist. On final gait bout pt navigated crowded entryway with automatic sliding doors. Following stand>sit to [facility] after last gait bout, pt dependently rolled back to room. Pt requested to remain in [facility] and chair alarm turned on and call bell within reach.    Therapy Documentation Precautions:  Precautions Precautions: Fall Restrictions Weight Bearing Restrictions: No    Therapy/Group: Individual Therapy  Verner Mould, DPT Acute Rehabilitation Services Office [phone]  02/16/23 3:43 PM

## 2023-02-16 NOTE — Progress Notes (Signed)
PROGRESS NOTE   Subjective/Complaints: No new complaints this morning It is his 89nd birthday today! Will maintain foley 7-10 days before attempting trial of void  ROS: +cough, +visual deficits, +dizziness, history of urinary retention recently   Objective:   No results found. Recent Labs    02/14/23 1055  WBC 11.3*  HGB 13.4  HCT 39.7  PLT 190   Recent Labs    02/14/23 1055  NA 132*  K 4.6  CL 96*  CO2 25  GLUCOSE 156*  BUN 31*  CREATININE 1.33*  CALCIUM 8.3*    Intake/Output Summary (Last 24 hours) at 02/16/2023 1938 Last data filed at 02/16/2023 1356 Gross per 24 hour  Intake 476 ml  Output 575 ml  Net -99 ml        Physical Exam: Vital Signs Blood pressure 107/60, pulse 99, temperature 98.1 F (36.7 C), resp. rate 17, height '5\' 7"'$  (1.702 m), weight 56.8 kg, SpO2 99 %. Gen: no distress, normal appearing HEENT: oral mucosa pink and moist, NCAT Eyes:     Extraocular Movements: Extraocular movements intact.     Pupils: Pupils are equal, round, and reactive to light.     Comments: Vision poor  Cardiovascular:     Rate and Rhythm: Normal rate and regular rhythm.  Pulmonary:     Effort: Pulmonary effort is normal. No respiratory distress.     Breath sounds: Rhonchi present.     Comments: Intermittent cough Abdominal:     General: Bowel sounds are normal.     Palpations: Abdomen is soft.  Musculoskeletal:        General: No swelling or tenderness.     Cervical back: Normal range of motion. Protracted posture Skin:    General: Skin is warm.     Findings: Bruising present.  Neurological:     Mental Status: He is alert.     Comments: Alert and oriented x 3. Normal insight and awareness. Intact Memory. Normal language and speech. Cranial nerve exam unremarkable. UE 5/5 prox to distal. LE: 4- HF, KE and 4+/5 ADF/PF. Sensory exam normal for light touch and pain in all 4 limbs. No limb ataxia or  cerebellar signs. No abnormal tone appreciated.   Ambulating with RW Psychiatric:        Mood and Affect: Mood normal.        Behavior: Behavior normal.    Assessment/Plan: 1. Functional deficits which require 3+ hours per day of interdisciplinary therapy in a comprehensive inpatient rehab setting. Physiatrist is providing close team supervision and 24 hour management of active medical problems listed below. Physiatrist and rehab team continue to assess barriers to discharge/monitor patient progress toward functional and medical goals  Care Tool:  Bathing    Body parts bathed by patient: Right arm, Left arm, Chest, Abdomen, Front perineal area, Right upper leg, Left upper leg, Right lower leg, Left lower leg, Face   Body parts bathed by helper: Buttocks     Bathing assist Assist Level: Minimal Assistance - Patient > 75%     Upper Body Dressing/Undressing Upper body dressing   What is the patient wearing?: Pull over shirt    Upper body assist  Assist Level: Supervision/Verbal cueing    Lower Body Dressing/Undressing Lower body dressing      What is the patient wearing?: Pants, Incontinence brief     Lower body assist Assist for lower body dressing: Minimal Assistance - Patient > 75%     Toileting Toileting    Toileting assist Assist for toileting: Contact Guard/Touching assist     Transfers Chair/bed transfer  Transfers assist     Chair/bed transfer assist level: Contact Guard/Touching assist     Locomotion Ambulation   Ambulation assist      Assist level: Supervision/Verbal cueing Assistive device: Rollator Max distance: 129f   Walk 10 feet activity   Assist     Assist level: Supervision/Verbal cueing Assistive device: Rollator   Walk 50 feet activity   Assist    Assist level: Supervision/Verbal cueing Assistive device: Rollator    Walk 150 feet activity   Assist    Assist level: Supervision/Verbal cueing Assistive device:  Rollator    Walk 10 feet on uneven surface  activity   Assist Walk 10 feet on uneven surfaces activity did not occur: Safety/medical concerns   Assist level: Contact Guard/Touching assist Assistive device: Rollator   Wheelchair     Assist Is the patient using a wheelchair?: No             Wheelchair 50 feet with 2 turns activity    Assist            Wheelchair 150 feet activity     Assist          Blood pressure 107/60, pulse 99, temperature 98.1 F (36.7 C), resp. rate 17, height '5\' 7"'$  (1.702 m), weight 56.8 kg, SpO2 99 %.  Medical Problem List and Plan: 1. Functional deficits secondary to debility related to COVID and multiple medical issues             -patient may shower             -ELOS/Goals: 10-12 days, supervision to mod I goals with PT, OT  Continue CIR 2.  Antithrombotics: -DVT/anticoagulation:  Pharmaceutical: Lovenox--discussed with pt the need for lovenox injections today.              -antiplatelet therapy: ASA.  3. Pain Management: Tylenol prn 4. Mood/Behavior/Sleep: LCSW to follow for evaluation and suppor.t              -antipsychotic agents: continue Seroquel at bedtime.  5. Neuropsych/cognition: This patient is capable of making decisions on his own behalf. 6. Skin/Wound Care: Routine pressure relief measures.  7. Fluids/Electrolytes/Nutrition: Monitor I/O. Check CMET in am             --Ensure added for malnutrition.  8. Covid PNA: Has completed Paxlovid/decadron to complete tomorrow. Respiratory precautions thru 03/11.              --pulmonary hygiene with flutter valve.  9. Delirium w/agitation/hallucinations: Monitor sleep/wake cycle. Continue Seroquel.  10. Hyponatremia: Na reviewed and decreased to 131 on 3/11, repeat tomorrow.  11. Acute on CKD: SCr was 1.4. Has had left nephrectomy for CA.   --has had worsening of renal status BUN/Scr 22/1.45-->45/1.55 --Encourage fluids. Recheck in am.   12.   Anemia/thrombocytopenia: admission Hgb/Plt-11.2/91 -->12.2/151             --Likely improved due to hemoconcentration. Recheck in am.  160 CAD s/p PTCA: Continue ASA and Lipitor. BB was d/c due to bradycardia. '             --  has been off enalapril due to acute on chronic renal failure.  14. Chronic dizziness/Legally blind (macula degeneration): continue Meclizine prn. Will educate patient that we can try this medicine --Fall precautions.  15. UTI: continue Keflex.   16. Cough: CXR ordered and shows atelectasis, SLP ordered since worse with eating and drinking. Cleared by SLP 17. Atelectasis: Recommend  incentive spirometer to improve breathing strength and ability over time. Discussed that using this can help to prevent pneumonia 18. Urinary retention: plan for trial of void after 7-10 days    LOS: 6 days A FACE TO FACE EVALUATION WAS PERFORMED  Donald Todd 02/16/2023, 7:38 PM

## 2023-02-16 NOTE — Progress Notes (Signed)
Occupational Therapy Session Note  Patient Details  Name: Donald Todd MRN: [id number] Date of Birth: 11/13/1929  Today's Date: 02/16/2023 OT Individual Time: 1300-1340 OT Individual Time Calculation (min): 40 min    Short Term Goals: Week 1:  OT Short Term Goal 1 (Week 1): STG=LTG d/t ELOS  Skilled Therapeutic Interventions/Progress Updates:    Pt resting in w/c upon arrival with wife present. OT amb with Rollator to Day Room at supervsion level. 7 mins NuStep level 2 with no rest breaks. After resting, pt amb with Rollator to room and returned to w/c. All needs within reach and family present.   Therapy Documentation Precautions:  Precautions Precautions: Fall Restrictions Weight Bearing Restrictions: No   Pain:  Pt denies pain this afternoon   Therapy/Group: Individual Therapy  Leroy Libman 02/16/2023, 1:48 PM

## 2023-02-16 NOTE — Progress Notes (Signed)
Nutrition Follow-up  DOCUMENTATION CODES:   Not applicable  INTERVENTION:  - Continue Regular diet.   - Continue Ensure Enlive po TID, each supplement provides 350 kcal and 20 grams of protein.   NUTRITION DIAGNOSIS:   Increased nutrient needs related to acute illness, catabolic illness ([id number] infection) as evidenced by estimated needs.  GOAL:   Patient will meet greater than or equal to 90% of their needs  MONITOR:   PO intake, Supplement acceptance, Labs, Weight trends  REASON FOR ASSESSMENT:   Malnutrition Screening Tool, Consult Calorie Count, Assessment of nutrition requirement/status (patient reports 60 lb weight loss in the past 6 months)  ASSESSMENT:   89 year-old male with medical history of CAD, renal cancer w/ intra-abdominal/psoas muscle mets s/p left nephrectomy, CKD, prostate cancer, HTN, legally blind, chronic vertigo. He was admitted on 02/03/23 to [facility] due to 2 days of cough, sore throat, weakness, dizziness, and poor po intake. He was found to be positive for Covid 19 and admitted due to concerns of hypoxia. He was started on Paxlovid and low dose decadron. He was noted to have diarrhea. PT/OT has been working with patient who was noted to be limited by weakness with unsteady gait and balance deficits. CIR recommended due to functional decline.  Meds reviewed:  lipitor, MVI. Labs reviewed: Na low, BUN/Creatinine high.   Pt has been eating well since admission. Per record, pt has eaten 75-100% of his meals. Pt is meeting his needs at this time. RD will continue to monitor PO intakes.   Diet Order:   Diet Order             Diet regular Room service appropriate? No; Fluid consistency: Thin  Diet effective now                   EDUCATION NEEDS:   Education needs have been addressed  Skin:  Skin Assessment: Reviewed RN Assessment  Last BM:  3/13  Height:   Ht Readings from Last 1 Encounters:  02/10/23 '5\' 7"'$  (1.702 m)    Weight:    Wt Readings from Last 1 Encounters:  02/10/23 56.8 kg    Ideal Body Weight:  67.3 kg  BMI:  Body mass index is 19.61 kg/m.  Estimated Nutritional Needs:   Kcal:  1725-1925 kcal  Protein:  85-100 grams  Fluid:  >/= 1.9 L/day  Thalia Bloodgood, RD, LDN, CNSC.

## 2023-02-17 ENCOUNTER — Inpatient Hospital Stay (HOSPITAL_COMMUNITY): Payer: Medicare Other

## 2023-02-17 MED ORDER — PREDNISONE 10 MG (21) PO TBPK
20.0000 mg | ORAL_TABLET | Freq: Every evening | ORAL | Status: AC
  Administered 2023-02-18: 20 mg via ORAL

## 2023-02-17 MED ORDER — PREDNISONE 10 MG (21) PO TBPK
10.0000 mg | ORAL_TABLET | Freq: Four times a day (QID) | ORAL | Status: DC
  Administered 2023-02-19 – 2023-02-21 (×8): 10 mg via ORAL

## 2023-02-17 MED ORDER — DICLOFENAC SODIUM 1 % EX GEL
2.0000 g | Freq: Four times a day (QID) | CUTANEOUS | Status: DC
  Administered 2023-02-17 – 2023-02-21 (×13): 2 g via TOPICAL
  Filled 2023-02-17: qty 100

## 2023-02-17 MED ORDER — PREDNISONE 10 MG (21) PO TBPK
10.0000 mg | ORAL_TABLET | ORAL | Status: AC
  Administered 2023-02-17: 10 mg via ORAL

## 2023-02-17 MED ORDER — LIDOCAINE HCL URETHRAL/MUCOSAL 2 % EX GEL: CUTANEOUS | Status: DC | PRN

## 2023-02-17 MED ORDER — PREDNISONE 10 MG (21) PO TBPK
20.0000 mg | ORAL_TABLET | Freq: Every morning | ORAL | Status: AC
  Administered 2023-02-17: 20 mg via ORAL
  Filled 2023-02-17: qty 21

## 2023-02-17 MED ORDER — PREDNISONE 10 MG (21) PO TBPK
10.0000 mg | ORAL_TABLET | Freq: Three times a day (TID) | ORAL | Status: AC
  Administered 2023-02-18 (×3): 10 mg via ORAL
  Filled 2023-02-17: qty 21

## 2023-02-17 MED ORDER — PREDNISONE 10 MG (21) PO TBPK
20.0000 mg | ORAL_TABLET | Freq: Every evening | ORAL | Status: AC
  Administered 2023-02-17: 20 mg via ORAL
  Filled 2023-02-17: qty 21

## 2023-02-17 NOTE — Progress Notes (Signed)
PROGRESS NOTE   Subjective/Complaints: Has new right sided wrist pain and tenderness- he woke up with this this morning, discussed getting XR to assess for fracture.   ROS: +cough, +visual deficits, +dizziness, history of urinary retention recently, +right wrist pain   Objective:   DG Wrist 2 Views Right  Result Date: 02/17/2023 CLINICAL DATA:  Pain, atraumatic right wrist pain EXAM: RIGHT WRIST - 2 VIEW COMPARISON:  Right hand radiograph 03/14/2017 FINDINGS: There is no evidence of acute fracture. Unchanged scapholunate diastasis. Radiocarpal and TFCC chondrocalcinosis. There is dorsal tilting of the lunate. Moderate-severe triscaphe arthritis with bony remodeling. Moderate third MCP joint space narrowing with chondrocalcinosis. Mild soft tissue swelling of the wrist. IMPRESSION: No acute osseous abnormality. 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 Mild dorsal tilt of the lunate. Chondrocalcinosis of the radiocarpal joint, TFCC, and third MCP joint suggesting CPPD arthropathy. 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 Electronically Signed   By: Maurine Simmering M.D.   On: 02/17/2023 10:22   No results for input(s): "WBC", "HGB", "HCT", "PLT" in the last 72 hours.  No results for input(s): "NA", "K", "CL", "CO2", "GLUCOSE", "BUN", "CREATININE", "CALCIUM" in the last 72 hours.   Intake/Output Summary (Last 24 hours) at 02/17/2023 1111 Last data filed at 02/17/2023 0750 Gross per 24 hour  Intake 236 ml  Output 450 ml  Net -214 ml        Physical Exam: Vital Signs Blood pressure 133/61, pulse 82, temperature 98.1 F (36.7 C), resp. rate 16, height 5\' 7"  (1.702 m), weight 56.8 kg, SpO2 100 %. Gen: no distress, normal appearing HEENT: oral mucosa pink and moist, NCAT Eyes:     Extraocular Movements: Extraocular movements intact.     Pupils: Pupils are equal,  round, and reactive to light.     Comments: Vision poor  Cardiovascular:     Rate and Rhythm: Normal rate and regular rhythm.  Pulmonary:     Effort: Pulmonary effort is normal. No respiratory distress.     Breath sounds: Rhonchi present.     Comments: Intermittent cough Abdominal:     General: Bowel sounds are normal.     Palpations: Abdomen is soft.  Musculoskeletal:        General: No swelling or tenderness.     Cervical back: Normal range of motion. Protracted posture Right wrist is tender to palpation Skin:    General: Skin is warm.     Findings: Bruising present.  Neurological:     Mental Status: He is alert.     Comments: Alert and oriented x 3. Normal insight and awareness. Intact Memory. Normal language and speech. Cranial nerve exam unremarkable. UE 5/5 prox to distal. LE: 4- HF, KE and 4+/5 ADF/PF. Sensory exam normal for light touch and pain in all 4 limbs. No limb ataxia or cerebellar signs. No abnormal tone appreciated.   Ambulating with RW Psychiatric:        Mood and Affect: Mood normal.        Behavior: Behavior normal.    Assessment/Plan: 1. Functional deficits which require 3+ hours per day of interdisciplinary therapy in a comprehensive inpatient rehab  setting. Physiatrist is providing close team supervision and 24 hour management of active medical problems listed below. Physiatrist and rehab team continue to assess barriers to discharge/monitor patient progress toward functional and medical goals  Care Tool:  Bathing    Body parts bathed by patient: Right arm, Left arm, Chest, Abdomen, Front perineal area, Right upper leg, Left upper leg, Right lower leg, Left lower leg, Face   Body parts bathed by helper: Buttocks     Bathing assist Assist Level: Minimal Assistance - Patient > 75%     Upper Body Dressing/Undressing Upper body dressing   What is the patient wearing?: Pull over shirt    Upper body assist Assist Level: Supervision/Verbal cueing     Lower Body Dressing/Undressing Lower body dressing      What is the patient wearing?: Pants, Incontinence brief     Lower body assist Assist for lower body dressing: Minimal Assistance - Patient > 75%     Toileting Toileting    Toileting assist Assist for toileting: Contact Guard/Touching assist     Transfers Chair/bed transfer  Transfers assist     Chair/bed transfer assist level: Contact Guard/Touching assist     Locomotion Ambulation   Ambulation assist      Assist level: Supervision/Verbal cueing Assistive device: Rollator Max distance: 131ft   Walk 10 feet activity   Assist     Assist level: Supervision/Verbal cueing Assistive device: Rollator   Walk 50 feet activity   Assist    Assist level: Supervision/Verbal cueing Assistive device: Rollator    Walk 150 feet activity   Assist    Assist level: Supervision/Verbal cueing Assistive device: Rollator    Walk 10 feet on uneven surface  activity   Assist Walk 10 feet on uneven surfaces activity did not occur: Safety/medical concerns   Assist level: Contact Guard/Touching assist Assistive device: Rollator   Wheelchair     Assist Is the patient using a wheelchair?: No             Wheelchair 50 feet with 2 turns activity    Assist            Wheelchair 150 feet activity     Assist          Blood pressure 133/61, pulse 82, temperature 98.1 F (36.7 C), resp. rate 16, height 5\' 7"  (1.702 m), weight 56.8 kg, SpO2 100 %.  Medical Problem List and Plan: 1. Functional deficits secondary to debility related to COVID and multiple medical issues             -patient may shower             -ELOS/Goals: 10-12 days, supervision to mod I goals with PT, OT  Continue CIR 2.  Antithrombotics: -DVT/anticoagulation:  Pharmaceutical: Lovenox--discussed with pt the need for lovenox injections today.              -antiplatelet therapy: ASA.  3. Pain Management: Tylenol  prn 4. Mood/Behavior/Sleep: LCSW to follow for evaluation and suppor.t              -antipsychotic agents: continue Seroquel at bedtime.  5. Neuropsych/cognition: This patient is capable of making decisions on his own behalf. 6. Skin/Wound Care: Routine pressure relief measures.  7. Fluids/Electrolytes/Nutrition: Monitor I/O. Check CMET in am             --Ensure added for malnutrition.  8. Covid PNA: Has completed Paxlovid/decadron to complete tomorrow. Respiratory precautions thru 03/11.              --  pulmonary hygiene with flutter valve.  9. Delirium w/agitation/hallucinations: Monitor sleep/wake cycle. Continue Seroquel.  10. Hyponatremia: Na reviewed and decreased to 131 on 3/11, repeat tomorrow.  11. Acute on CKD: SCr was 1.4. Has had left nephrectomy for CA.   --has had worsening of renal status BUN/Scr 22/1.45-->45/1.55 --Encourage fluids. Recheck in am.   12.  Anemia/thrombocytopenia: admission Hgb/Plt-11.2/91 -->12.2/151             --Likely improved due to hemoconcentration. Recheck in am.  85. CAD s/p PTCA: Continue ASA and Lipitor. BB was d/c due to bradycardia. '             --has been off enalapril due to acute on chronic renal failure.  14. Chronic dizziness/Legally blind (macula degeneration): continue Meclizine prn. Will educate patient that we can try this medicine --Fall precautions.  15. UTI: continue Keflex.  Stop date of 7 days ordered.  16. Cough: CXR ordered and shows atelectasis, SLP ordered since worse with eating and drinking. Cleared by SLP 17. Atelectasis: Recommend  incentive spirometer to improve breathing strength and ability over time. Discussed that using this can help to prevent pneumonia 18. Urinary retention: plan for trial of void after 7-10 days, discussed with patient.  19. Right wrist pain: XR obtained and shows moderate severe triscaphe arthritis wit bony remodeling that could be degenerative of CPPD, chondrocalcinosis of radiocarpal  joint/TFCC/third MCP suggestive of CPPD arthropathy.schapholunate ligament tear, will consult ortho, voltaren ordered, wrist brace ordered    LOS: 7 days A FACE TO FACE EVALUATION WAS PERFORMED  Leni Pankonin P Anushka Hartinger 02/17/2023, 11:11 AM

## 2023-02-17 NOTE — Progress Notes (Signed)
Physical Therapy Session Note  Patient Details  Name: Donald Todd MRN: [id number] Date of Birth: [birth year]  Today's Date: 02/17/2023 PT Individual Time: 1st treatment session time 1030-1127, 2nd treatment session time 1450-1129 PT Individual Time Calculation (min): 57 min, 39 min   Short Term Goals: Week 1:  PT Short Term Goal 1 (Week 1): STG=LTG  Skilled Therapeutic Interventions/Progress Updates:   Treatment Session 1   Received pt seated in WC, Pt alert and oriented and agreeable to PT treatment. Pt reports 7/10 R wrist pain during session.  Session with emphasis on functional mobility/transfers, generalized strengthening, dynamic standing balance/coordination, and pain relief for wrist. Initially began session with limited use of L UE with increased emphasis on balance. Pt transported to gym dependent in [facility] for sake of time and emphasis on balance. Pt performed STS x 3 from mat table with feet on airex pad with R UE support only and min A. Pt attempted ambulation with no AD for emphasis on balance but resorted back to use of rollator for due to pt discomfort. Pt performed obstacle course x3 consisting of the following: weaved in out of 6 cones and stepped over 2 yoga blocks with rollator and min A; pt demos minor LOB during step overs. Cuing provided for foot clearance of following limb, pt demos improvement on next attempt. Pt required seated rest break between each trial of obstacle course due to fatigue and mild dizziness. Scott delivered wrist cock up splint to room at during of session. Donned it and wore it throughout rest of session. and pt reports significant reduction in pain (did not provide numerical rating). Pt performed STS x 5 throughout rest of session for performance of balance activities with B UE use and min A. Pt performed the following progression of toe taps: B alteranating toe taps onto yellow and blue dots on floor x10 with B UE support with CGA, B alternating toe taps  onto blue and yellow dots on floor x 10 with L UE support only and CGA. B alternating toe taps onto with no UE support and CGA to min A, pt demos tendency to shuffle closer to dots with no UE support, cuing provided to maintain foot positioning to encourage increased step length. Pt performed 2x10 toe taps on blue and yellow dots on 4 inch step with L UE support on chair and CGA.  Pt demos increased difficulty with standing on L LE and tapping R foot, cuing provided for slow and controlled light taps. Pt returned to room dependent in [facility] for purposes of time. Pt seated in WC, with seatbelt alarm on and all needs within reach.   Treatment Session 2   Pt seated asleep in WC upon arrival. Pt denies any pain. Upon awakening pt agreeable to therapy and requesting to go outside. Session with emphasis on functional mobility/transfers, generalized strengthening, and endurance, dynamic standing/coordination, and gait trainning. Transported dependent in [place] to [place] hallway for time purposes. Ambulated along [place] hallway due to inclement weather. Pt ambulated with rollator and supervision 1x100 feet, 2x216 feet; pt required seated rest break on rollator between ambulation trials. Cuing provided throughout for posture and looking ahead versus down when ambulating for safety and obstacle navigation. Pt performed STSx10 throughout session with supervision/CGA. Pt performed stand pivot transfers x2 from WC/bed with supervision with rollator, cuing provided for locking brakes prior to sitting. Pt performed sit EOB<>supine with supervision, cuing provided for foley catheter. Pt articulated concern about discharging on Tuesday due to  continued weakness/fatigue/dizziness. Discussed current discharge plan. Assured pt the team would discuss and reconvene on Monday. Pt in bed and end of session with all needs within reach. Nursing tech in room at end of session.     Therapy Documentation Precautions:  Precautions Precautions:  Fall Restrictions Weight Bearing Restrictions: No     Therapy/Group: Individual Therapy  [facility] [place] Alfredia Ferguson, [place], DPT  02/17/2023, 7:32 AM

## 2023-02-17 NOTE — Progress Notes (Signed)
Occupational Therapy Session Note  Patient Details  Name: Donald Todd MRN: [id number] Date of Birth: 27-Oct-1929  Today's Date: 02/17/2023 OT Individual Time: 1300-1330 OT Individual Time Calculation (min): 30 min    Short Term Goals: Week 1:  OT Short Term Goal 1 (Week 1): STG=LTG d/t ELOS  Skilled Therapeutic Interventions/Progress Updates:    Pt resting in w/c upon arrival. OT intervention with focus on functional amb with Rollator and BUE/BLE therex for strengthening and endurance. Pt amb with Rollator to day room with close supervision. NuStep 7 mins level 4 BUE/BLE and 3 mins level 2 BLE only. After rest, pt amb with Rollator back to room. Pt remained in w/c with belt alarm activated and all needs within reach.   Therapy Documentation Precautions:  Precautions Precautions: Fall Restrictions Weight Bearing Restrictions: No Pain:  Pt denies pain this afternoon  Therapy/Group: Individual Therapy  Leroy Libman 02/17/2023, 2:47 PM

## 2023-02-17 NOTE — Progress Notes (Signed)
Orthopedic Tech Progress Note Patient Details:  [name] Jun 06, 1929 [id number]  Patient ID: Donald Todd, male   DOB: 12-29-1928, 89 y.o.   MRN: [id number] [facility] made aware of order for hand splint. Vernona Rieger 02/17/2023, 10:22 AM

## 2023-02-17 NOTE — Progress Notes (Signed)
Occupational Therapy Session Note  Patient Details  Name: Donald Todd MRN: [id number] Date of Birth: 1929-05-18  Today's Date: 02/17/2023 OT Individual Time: 0905-1000 OT Individual Time Calculation (min): 55 min    Short Term Goals: Week 1:  OT Short Term Goal 1 (Week 1): STG=LTG d/t ELOS  Skilled Therapeutic Interventions/Progress Updates:  Skilled OT intervention completed with focus on endurance, functional transfers. Pt received upright in bed, agreeable to session. New unrated pain reported in R wrist with visible swelling at the base of 1st MC joint; pre-medicated. Notified nursing and MD about potential need for x-ray, brace or potential gout flare(?) as pt states this is new from overnight and is limiting his ability. OT offered rest breaks, repositioning and distraction throughout for pain reduction.  Completed bed mobility to EOB with supervision with use of L hand on bed rail vs pushing with R hand due to pain. Stood with min A then min A stand pivot to w/c. Drained catheter/emptied (1000 cc). Pt requesting to ride to gym due to pain in R wrist. MD present to assess wrist suggesting wrist brace for protection/stability and plan for x-ray.  Transported dependently in w/c <> gym. Completed CGA sit > stand with L HHA and CGA stand pivot to nustep. Completed 15 mins on nustep (level 6 for 8 mins, level 8 for remaining 7) for cardiovascular and respiratory endurance needed for ADLs. Pt with intermittent use of R hand on handle as tolerated but advised to protect the wrist until further known cause of pain/swelling. Discussed shower set ups with plan to practice both walk in and tub/shower options in prep for DC when wrist feels better. Min A sit > stand and CGA stand pivot with HHA to w/c.   Pt remained seated in w/c, with belt alarm on/activated, and with all needs in reach at end of session.   Therapy Documentation Precautions:  Precautions Precautions:  Fall Restrictions Weight Bearing Restrictions: No    Therapy/Group: Individual Therapy  Blase Mess, MS, OTR/L  02/17/2023, 12:18 PM

## 2023-02-17 NOTE — Progress Notes (Addendum)
Patient ID: Donald Todd, male   DOB: 07-06-1929, 89 y.o.   MRN: [id number]  SW met with patient, spouse and son in room. Spouse and concerned about pt's LOC and spouse caring for patient at home. Son reports spouse requires assistance with insulin and medication management. SW informed family that [facility] has been arranged for patient at d/c and patient would require [facility] for additional services such as supervision, assist in the home, meals, household taks and medications. Son and spouse report they worked with an agency previously but the aide was not much assistance. SW provided spouse and son with a [employer] of [facility] agencies to review. Patient potential has LT plan that will assist with [facility] in home. Family will review and discuss together and FU with SW. No additional questions or concerns.

## 2023-02-18 DIAGNOSIS — K5901 Slow transit constipation: Secondary | ICD-10-CM

## 2023-02-18 MED ORDER — POLYETHYLENE GLYCOL 3350 17 G PO PACK
17.0000 g | PACK | Freq: Every day | ORAL | Status: DC
  Administered 2023-02-18 – 2023-02-21 (×4): 17 g via ORAL
  Filled 2023-02-18 (×4): qty 1

## 2023-02-18 MED ORDER — DOCUSATE SODIUM 100 MG PO CAPS
100.0000 mg | ORAL_CAPSULE | Freq: Every day | ORAL | Status: DC
  Administered 2023-02-18 – 2023-02-21 (×4): 100 mg via ORAL
  Filled 2023-02-18 (×4): qty 1

## 2023-02-18 NOTE — Progress Notes (Signed)
PROGRESS NOTE   Subjective/Complaints: Doing well today, happy with wrist brace. Wrist pain doing better. Sleeping well, family and pt have noticed he's not having hallucinations anymore, wondering what med he got here that seemed to help (discussed likely seroquel-- wants to go home with this).  Hasn't had a BM in several days. Documented LBM 3/13. States this is somewhat normal for him, but he uses miralax and colace at home. Would like those here.  Foley in place, no issues. Wondered about Flomax, discussed this one can't be crushed; if it becomes an issue, he will let us know.   ROS: +constipation, +cough-improving, +visual deficits, +dizziness, history of urinary retention recently, +right wrist pain-improving. Denies CP, SOB, abd pain, n/v/d.    Objective:   DG Wrist 2 Views Right  Result Date: 02/17/2023 CLINICAL DATA:  Pain, atraumatic right wrist pain EXAM: RIGHT WRIST - 2 VIEW COMPARISON:  Right hand radiograph 03/14/2017 FINDINGS: There is no evidence of acute fracture. Unchanged scapholunate diastasis. Radiocarpal and TFCC chondrocalcinosis. There is dorsal tilting of the lunate. Moderate-severe triscaphe arthritis with bony remodeling. Moderate third MCP joint space narrowing with chondrocalcinosis. Mild soft tissue swelling of the wrist. IMPRESSION: No acute osseous abnormality. 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 Mild dorsal tilt of the lunate. Chondrocalcinosis of the radiocarpal joint, TFCC, and third MCP joint suggesting CPPD arthropathy. 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 Electronically Signed   By: Maurine Simmering M.D.   On: 02/17/2023 10:22   No results for input(s): "WBC", "HGB", "HCT", "PLT" in the last 72 hours.  No results for input(s): "NA", "K", "CL", "CO2", "GLUCOSE", "BUN", "CREATININE", "CALCIUM" in the last 72  hours.   Intake/Output Summary (Last 24 hours) at 02/18/2023 0737 Last data filed at 02/18/2023 0726 Gross per 24 hour  Intake 692 ml  Output 625 ml  Net 67 ml         Physical Exam: Vital Signs Blood pressure 123/63, pulse 86, temperature 98.9 F (37.2 C), resp. rate 18, height 5\' 7"  (1.702 m), weight 56.8 kg, SpO2 100 %.  Constitutional:  NAD, laying in bed HENT: [place]/AT, MMM Eyes: PERRL, EOMI, poor vision Cardiovascular: RRR, nl s1/s2, no m/r/g, no pedal edema Pulmonary: CTAB on anterior fields, did not auscultate posterior fields this morning, no w/r/r appreciated. No cough during exam.  Abdominal: Soft, NTND, +BS throughout, no r/g/r Musculoskeletal: R wrist in splint.  Skin:    General: Skin is warm.     Findings: Bruising present.  PRIOR EXAM: Neurological:     Mental Status: He is alert.     Comments: Alert and oriented x 3. Normal insight and awareness. Intact Memory. Normal language and speech. Cranial nerve exam unremarkable. UE 5/5 prox to distal. LE: 4- HF, KE and 4+/5 ADF/PF. Sensory exam normal for light touch and pain in all 4 limbs. No limb ataxia or cerebellar signs. No abnormal tone appreciated.   Ambulating with RW Psychiatric:        Mood and Affect: Mood normal.        Behavior: Behavior normal.    Assessment/Plan: 1. Functional deficits which require 3+ hours per day  of interdisciplinary therapy in a comprehensive inpatient rehab setting. Physiatrist is providing close team supervision and 24 hour management of active medical problems listed below. Physiatrist and rehab team continue to assess barriers to discharge/monitor patient progress toward functional and medical goals  Care Tool:  Bathing    Body parts bathed by patient: Right arm, Left arm, Chest, Abdomen, Front perineal area, Right upper leg, Left upper leg, Right lower leg, Left lower leg, Face   Body parts bathed by helper: Buttocks     Bathing assist Assist Level: Minimal Assistance -  Patient > 75%     Upper Body Dressing/Undressing Upper body dressing   What is the patient wearing?: Pull over shirt    Upper body assist Assist Level: Supervision/Verbal cueing    Lower Body Dressing/Undressing Lower body dressing      What is the patient wearing?: Pants, Incontinence brief     Lower body assist Assist for lower body dressing: Minimal Assistance - Patient > 75%     Toileting Toileting    Toileting assist Assist for toileting: Contact Guard/Touching assist     Transfers Chair/bed transfer  Transfers assist     Chair/bed transfer assist level: Supervision/Verbal cueing     Locomotion Ambulation   Ambulation assist      Assist level: Supervision/Verbal cueing Assistive device: Rollator Max distance: 216 ft   Walk 10 feet activity   Assist     Assist level: Supervision/Verbal cueing Assistive device: Rollator   Walk 50 feet activity   Assist    Assist level: Supervision/Verbal cueing Assistive device: Rollator    Walk 150 feet activity   Assist    Assist level: Supervision/Verbal cueing Assistive device: Rollator    Walk 10 feet on uneven surface  activity   Assist Walk 10 feet on uneven surfaces activity did not occur: Safety/medical concerns   Assist level: Contact Guard/Touching assist Assistive device: Rollator   Wheelchair     Assist Is the patient using a wheelchair?: No             Wheelchair 50 feet with 2 turns activity    Assist            Wheelchair 150 feet activity     Assist          Blood pressure 123/63, pulse 86, temperature 98.9 F (37.2 C), resp. rate 18, height 5\' 7"  (1.702 m), weight 56.8 kg, SpO2 100 %.  Medical Problem List and Plan: 1. Functional deficits secondary to debility related to COVID and multiple medical issues             -patient may shower -ELOS/Goals: d/c 02/21/23 goal; 10-12 days, supervision to mod I goals with PT, OT  -Continue CIR 2.   Antithrombotics: -DVT/anticoagulation:  Pharmaceutical: Lovenox 30mg  QD--discussed with pt the need for lovenox injections today.              -antiplatelet therapy: ASA 81mg  QD  3. Pain Management: Tylenol prn. Voltaren gel QID 4. Mood/Behavior/Sleep: LCSW to follow for evaluation and support  -antipsychotic agents: continue Seroquel 25mg  at bedtime  -Trazodone 25-50mg  QHS PRN  5. Neuropsych/cognition: This patient is capable of making decisions on his own behalf. 6. Skin/Wound Care: Routine pressure relief measures.  7. Fluids/Electrolytes/Nutrition: Monitor I/O. Monitor weekly labs next 02/20/23             --Ensure added for malnutrition.  -02/18/23 labs this week stable, monitor on weekly labs  8. Covid PNA: Has completed  Paxlovid/decadron. Respiratory precautions thru 03/11-completed.              --pulmonary hygiene with flutter valve.  -Cont Claritin 10mg  QD, Cepacol lozenges TID, Chloraseptic spray PRN, RobitussinDM PRN, Benadryl PRN, Atrovent nasal spray PRN  9. Delirium w/agitation/hallucinations: Monitor sleep/wake cycle. Continue Seroquel. Likes Seroquel, thinks it's helping, wants to go home with it.   10. Hyponatremia: Na reviewed, up to 132 on 3/12, monitor weekly labs 11. Acute on CKD: SCr was 1.4. Has had left nephrectomy for CA.   --has had worsening of renal status BUN/Scr 22/1.45-->45/1.55 --Encourage fluids. Recheck in am.   -02/14/23 BUN/Cr stable, 31/1.33. Monitor on labs next 02/20/23  12.  Anemia/thrombocytopenia: admission Hgb/Plt-11.2/91 -->12.2/151             --Likely improved due to hemoconcentration. Recheck in am.   -02/14/23 labs stabilizing, monitor weekly, next 02/20/23  13. HTN and CAD s/p PTCA: Continue ASA and Lipitor 40mg  QD. BB was d/c due to bradycardia. On Amlodipine 10mg  QD.              --has been off enalapril due to acute on chronic renal failure.   -02/18/23 BPs stable, cont regimen Vitals:   02/14/23 1319 02/14/23 1947 02/15/23 0501 02/15/23 1312   BP: (!) 142/68 126/76 122/79 125/60   02/15/23 1959 02/16/23 0458 02/16/23 1359 02/16/23 2005  BP: (!) 120/59 119/68 107/60 116/63   02/17/23 0513 02/17/23 1337 02/17/23 1955 02/18/23 0514  BP: 133/61 124/65 134/63 123/63    14. Chronic dizziness/Legally blind (macula degeneration): continue Meclizine 25mg  prn. Will educate patient that we can try this medicine --Fall precautions.  -Continue prosight BID 15. UTI: continue Keflex 500mg  BID.  Stop date of 7 days ordered. End 02/20/23 PM dose 16. Cough: CXR ordered and shows atelectasis, SLP ordered since worse with eating and drinking. Cleared by SLP 17. Atelectasis: Recommend  incentive spirometer to improve breathing strength and ability over time. Discussed that using this can help to prevent pneumonia 18. Urinary retention: Hx of prostate and renal cancer s/p L nephrectomy, with acute urinary retention 02/11/23, Dr. Kathrynn Ducking of Urology placed 12Fr foley; plan for trial of void after 7-10 days, discussed with patient. Started Flomax 0.4mg  QD on 02/16/23 19. Right wrist pain: XR obtained and shows moderate severe triscaphe arthritis wit bony remodeling that could be degenerative of CPPD, chondrocalcinosis of radiocarpal joint/TFCC/third MCP suggestive of CPPD arthropathy.schapholunate ligament tear, will consult ortho, voltaren ordered, wrist brace ordered.  -02/18/23 improving, cont regimen/brace 20. Acute on chronic constipation: takes miralax/colace at home -02/18/23 no BM in 3 days, will start Miralax 17g QD and Colace 100mg  QD for now; monitor for effect.     LOS: 8 days A FACE TO [place] 02/18/2023, 7:37 AM

## 2023-02-18 NOTE — Progress Notes (Signed)
Discussed with patient on diet and medication use at home. Discussed discontinuation of foley in the AM. Pt verbalizes understanding and expectations for discharge. No further questions.  Sheela Stack, LPN

## 2023-02-18 NOTE — Progress Notes (Signed)
Occupational Therapy Session Note  Patient Details  Name: Donald Todd MRN: [id number] Date of Birth: 1929-07-13  Today's Date: 02/18/2023 OT Individual Time: [id number] OT Individual Time Calculation (min): 62 min    Short Term Goals: Week 1:  OT Short Term Goal 1 (Week 1): STG=LTG d/t ELOS  Skilled Therapeutic Interventions/Progress Updates:  Skilled OT intervention completed with focus on family education with wife and daughter present, along with tub/shower and walk in shower transfer training. Pt received upright in bed, agreeable to session. No pain reported.  Family present, with original concern about pt's status and wife's ability to care for pt at home, however encouraged family to observe pt's CLOF during session to prep for DC and offer time of questions.  Completed bed mobility with supervision, assist only provided for catheter (which is planned to be removed tomorrow). CGA sit > stand using rollator due to retropulsion and reliance on both knees pushing on bed for counter balance but once up was able to ambulate with supervision with rollator.   Ambulated >200 ft > ADL apartment with directional cues only. Did fatigue with duration however no rest break needed during bout of gait.   Pt has both tub/shower and walk in shower available at home. Education provided on DME available such as tub bench that can be purchased for use of tub/shower combo. Suggested use of [place] for ease of bathing, shower curtain management to prevent water spillage, minimizing stands via lateral leans for pericare, use of grab bars/installation as well as effective safety strategies for exiting the shower to eliminate falls. Pt was able to demo a supervision level transfer in/out of the shower with this device.  Set up walk in shower with STE for pt to practice, however required CGA up to min A and mod cues for hand placement to park rollator, side stepping and lowering to chair. After demo, family  decided tub/shower with the bench is preferred due to pt's wife's supervision A level capability only. Did discuss options of hiring privately for aide for 2-3 days a week for assist with showers if concerned but pt to be at supervision/mod I level for ambulation and other needs by DC. Pt and family all in agreement and feel better about current DC date after seeing his progress.  Ambulated back to room >200 ft at prior assist, then remained seated in w/c with family present and all needs in reach at end of session.   Therapy Documentation Precautions:  Precautions Precautions: Fall Restrictions Weight Bearing Restrictions: No    Therapy/Group: Individual Therapy  Blase Mess, MS, OTR/L  02/18/2023, 12:16 PM

## 2023-02-19 DIAGNOSIS — I10 Essential (primary) hypertension: Secondary | ICD-10-CM

## 2023-02-19 NOTE — Progress Notes (Signed)
PROGRESS NOTE   Subjective/Complaints: Slept well overnight, in good spirits today. Denies pain. LBM 1-2 days ago but documentation states no BM in 4 days. Foley removed this morning at 6am, will begin timed voiding trial today. Denies any other complaints or concerns today.   ROS: +constipation, +cough-improving, +visual deficits, +dizziness, +history of urinary retention recently, +right wrist pain-improving. Denies CP, SOB, abd pain, n/v/d.    Objective:   No results found. No results for input(s): "WBC", "HGB", "HCT", "PLT" in the last 72 hours.  No results for input(s): "NA", "K", "CL", "CO2", "GLUCOSE", "BUN", "CREATININE", "CALCIUM" in the last 72 hours.   Intake/Output Summary (Last 24 hours) at 02/19/2023 1201 Last data filed at 02/19/2023 0737 Gross per 24 hour  Intake 1080 ml  Output 1000 ml  Net 80 ml        Physical Exam: Vital Signs Blood pressure 118/66, pulse 78, temperature 98.1 F (36.7 C), temperature source Oral, resp. rate 16, height 5\' 7"  (1.702 m), weight 56.8 kg, SpO2 99 %.  Constitutional:  NAD, laying in bed HENT: [place]/AT, MMM Eyes: PERRL, EOMI, poor vision Cardiovascular: RRR, nl s1/s2, no m/r/g, no pedal edema Pulmonary: CTAB on anterior fields, did not auscultate posterior fields this morning, no w/r/r appreciated. No cough during exam.  Abdominal: Soft, NTND, +BS throughout, no r/g/r Musculoskeletal: R wrist in splint.  Skin:    General: Skin is warm.     Findings: Bruising present. Psych: pleasant and cooperative, in good spirits  PRIOR EXAM: Neurological:     Mental Status: He is alert.     Comments: Alert and oriented x 3. Normal insight and awareness. Intact Memory. Normal language and speech. Cranial nerve exam unremarkable. UE 5/5 prox to distal. LE: 4- HF, KE and 4+/5 ADF/PF. Sensory exam normal for light touch and pain in all 4 limbs. No limb ataxia or cerebellar signs. No  abnormal tone appreciated.   Ambulating with RW Psychiatric:        Mood and Affect: Mood normal.        Behavior: Behavior normal.    Assessment/Plan: 1. Functional deficits which require 3+ hours per day of interdisciplinary therapy in a comprehensive inpatient rehab setting. Physiatrist is providing close team supervision and 24 hour management of active medical problems listed below. Physiatrist and rehab team continue to assess barriers to discharge/monitor patient progress toward functional and medical goals  Care Tool:  Bathing    Body parts bathed by patient: Right arm, Left arm, Chest, Abdomen, Front perineal area, Right upper leg, Left upper leg, Right lower leg, Left lower leg, Face   Body parts bathed by helper: Buttocks     Bathing assist Assist Level: Minimal Assistance - Patient > 75%     Upper Body Dressing/Undressing Upper body dressing   What is the patient wearing?: Pull over shirt    Upper body assist Assist Level: Supervision/Verbal cueing    Lower Body Dressing/Undressing Lower body dressing      What is the patient wearing?: Pants, Incontinence brief     Lower body assist Assist for lower body dressing: Minimal Assistance - Patient > 75%     Toileting Toileting  Toileting assist Assist for toileting: Contact Guard/Touching assist     Transfers Chair/bed transfer  Transfers assist     Chair/bed transfer assist level: Supervision/Verbal cueing     Locomotion Ambulation   Ambulation assist      Assist level: Supervision/Verbal cueing Assistive device: Rollator Max distance: 216 ft   Walk 10 feet activity   Assist     Assist level: Supervision/Verbal cueing Assistive device: Rollator   Walk 50 feet activity   Assist    Assist level: Supervision/Verbal cueing Assistive device: Rollator    Walk 150 feet activity   Assist    Assist level: Supervision/Verbal cueing Assistive device: Rollator    Walk 10 feet  on uneven surface  activity   Assist Walk 10 feet on uneven surfaces activity did not occur: Safety/medical concerns   Assist level: Contact Guard/Touching assist Assistive device: Rollator   Wheelchair     Assist Is the patient using a wheelchair?: No             Wheelchair 50 feet with 2 turns activity    Assist            Wheelchair 150 feet activity     Assist          Blood pressure 118/66, pulse 78, temperature 98.1 F (36.7 C), temperature source Oral, resp. rate 16, height 5\' 7"  (1.702 m), weight 56.8 kg, SpO2 99 %.  Medical Problem List and Plan: 1. Functional deficits secondary to debility related to COVID and multiple medical issues             -patient may shower -ELOS/Goals: d/c 02/21/23 goal; 10-12 days, supervision to mod I goals with PT, OT  -Continue CIR 2.  Antithrombotics: -DVT/anticoagulation:  Pharmaceutical: Lovenox 30mg  QD--discussed with pt the need for lovenox injections today.              -antiplatelet therapy: ASA 81mg  QD  3. Pain Management: Tylenol prn. Voltaren gel QID 4. Mood/Behavior/Sleep: LCSW to follow for evaluation and support  -antipsychotic agents: continue Seroquel 25mg  at bedtime  -Trazodone 25-50mg  QHS PRN  5. Neuropsych/cognition: This patient is capable of making decisions on his own behalf. 6. Skin/Wound Care: Routine pressure relief measures.  7. Fluids/Electrolytes/Nutrition: Monitor I/O. Monitor weekly labs next 02/20/23             --Ensure added for malnutrition.  -02/18/23 labs this week stable, monitor on weekly labs  8. Covid PNA: Has completed Paxlovid/decadron. Respiratory precautions thru 03/11-completed.              --pulmonary hygiene with flutter valve.  -Cont Claritin 10mg  QD, Cepacol lozenges TID, Chloraseptic spray PRN, RobitussinDM PRN, Benadryl PRN, Atrovent nasal spray PRN  9. Delirium w/agitation/hallucinations: Monitor sleep/wake cycle. Continue Seroquel. Likes Seroquel, thinks it's  helping, wants to go home with it.   10. Hyponatremia: Na reviewed, up to 132 on 3/12, monitor weekly labs 11. Acute on CKD: SCr was 1.4. Has had left nephrectomy for CA.   --has had worsening of renal status BUN/Scr 22/1.45-->45/1.55 --Encourage fluids. Recheck in am.   -02/14/23 BUN/Cr stable, 31/1.33. Monitor on labs next 02/20/23  12.  Anemia/thrombocytopenia: admission Hgb/Plt-11.2/91 -->12.2/151             --Likely improved due to hemoconcentration. Recheck in am.   -02/14/23 labs stabilizing, monitor weekly, next 02/20/23  13. HTN and CAD s/p PTCA: Continue ASA and Lipitor 40mg  QD. BB was d/c due to bradycardia. On Amlodipine 10mg  QD.              --  has been off enalapril due to acute on chronic renal failure.   -3/16-17/24 BPs stable, cont regimen Vitals:   02/15/23 1312 02/15/23 1959 02/16/23 0458 02/16/23 1359  BP: 125/60 (!) 120/59 119/68 107/60   02/16/23 2005 02/17/23 0513 02/17/23 1337 02/17/23 1955  BP: 116/63 133/61 124/65 134/63   02/18/23 0514 02/18/23 1317 02/18/23 1923 02/19/23 0523  BP: 123/63 119/62 124/61 118/66    14. Chronic dizziness/Legally blind (macula degeneration): continue Meclizine 25mg  prn. Will educate patient that we can try this medicine --Fall precautions.  -Continue prosight BID 15. UTI: continue Keflex 500mg  BID.  Stop date of 7 days ordered. End 02/20/23 PM dose 16. Cough: CXR ordered and shows atelectasis, SLP ordered since worse with eating and drinking. Cleared by SLP 17. Atelectasis: Recommend  incentive spirometer to improve breathing strength and ability over time. Discussed that using this can help to prevent pneumonia 18. Urinary retention: Hx of prostate and renal cancer s/p L nephrectomy, with acute urinary retention 02/11/23, Dr. Kathrynn Ducking of Urology placed 12Fr foley; plan for trial of void after 7-10 days, discussed with patient. Started Flomax 0.4mg  QD on 02/16/23 -02/19/23 foley removed this AM, TOV begins today; monitor 19. Right wrist  pain: XR obtained and shows moderate severe triscaphe arthritis wit bony remodeling that could be degenerative of CPPD, chondrocalcinosis of radiocarpal joint/TFCC/third MCP suggestive of CPPD arthropathy.schapholunate ligament tear, will consult ortho, voltaren ordered, wrist brace ordered. -3/16-17/24 improving, cont regimen/brace, pt wants to know if he'll go home with this-- defer to weekday team 20. Acute on chronic constipation: takes miralax/colace at home -02/18/23 no BM in 3 days, will start Miralax 17g QD and Colace 100mg  QD for now; monitor for effect.  -02/19/23 still no BM, if no BM by [facility] 02/20/23 then consider sorbitol+enema    LOS: 9 days A FACE TO [place] 02/19/2023, 12:01 PM

## 2023-02-20 LAB — BASIC METABOLIC PANEL
Anion gap: 8 (ref 5–15)
BUN: 40 mg/dL — ABNORMAL HIGH (ref 8–23)
CO2: 22 mmol/L (ref 22–32)
Calcium: 8.6 mg/dL — ABNORMAL LOW (ref 8.9–10.3)
Chloride: 103 mmol/L (ref 98–111)
Creatinine, Ser: 1.46 mg/dL — ABNORMAL HIGH (ref 0.61–1.24)
GFR, Estimated: 45 mL/min — ABNORMAL LOW (ref >60)
Glucose, Bld: 134 mg/dL — ABNORMAL HIGH (ref 70–99)
Potassium: 5.1 mmol/L (ref 3.5–5.1)
Sodium: 133 mmol/L — ABNORMAL LOW (ref 135–145)

## 2023-02-20 LAB — CBC
HCT: 32.3 % — ABNORMAL LOW (ref 39.0–52.0)
Hemoglobin: 10.7 g/dL — ABNORMAL LOW (ref 13.0–17.0)
MCH: 30.2 pg (ref 26.0–34.0)
MCHC: 33.1 g/dL (ref 30.0–36.0)
MCV: 91.2 fL (ref 80.0–100.0)
Platelets: 185 10*3/uL (ref 150–400)
RBC: 3.54 MIL/uL — ABNORMAL LOW (ref 4.22–5.81)
RDW: 12.9 % (ref 11.5–15.5)
WBC: 8.7 10*3/uL (ref 4.0–10.5)
nRBC: 0 % (ref 0.0–0.2)

## 2023-02-20 LAB — OCCULT BLOOD X 1 CARD TO LAB, STOOL: Fecal Occult Bld: NEGATIVE

## 2023-02-20 NOTE — Plan of Care (Signed)
  Problem: RH Balance Goal: LTG: Patient will maintain dynamic sitting balance (OT) Description: LTG:  Patient will maintain dynamic sitting balance with assistance during activities of daily living (OT) Outcome: Completed/Met Goal: LTG Patient will maintain dynamic standing with ADLs (OT) Description: LTG:  Patient will maintain dynamic standing balance with assist during activities of daily living (OT)  Outcome: Completed/Met   Problem: RH Grooming Goal: LTG Patient will perform grooming w/assist,cues/equip (OT) Description: LTG: Patient will perform grooming with assist, with/without cues using equipment (OT) Outcome: Completed/Met   Problem: RH Bathing Goal: LTG Patient will bathe all body parts with assist levels (OT) Description: LTG: Patient will bathe all body parts with assist levels (OT) Outcome: Completed/Met   Problem: RH Dressing Goal: LTG Patient will perform upper body dressing (OT) Description: LTG Patient will perform upper body dressing with assist, with/without cues (OT). Outcome: Completed/Met Goal: LTG Patient will perform lower body dressing w/assist (OT) Description: LTG: Patient will perform lower body dressing with assist, with/without cues in positioning using equipment (OT) Outcome: Completed/Met   Problem: RH Toileting Goal: LTG Patient will perform toileting task (3/3 steps) with assistance level (OT) Description: LTG: Patient will perform toileting task (3/3 steps) with assistance level (OT)  Outcome: Completed/Met   Problem: RH Toilet Transfers Goal: LTG Patient will perform toilet transfers w/assist (OT) Description: LTG: Patient will perform toilet transfers with assist, with/without cues using equipment (OT) Outcome: Completed/Met   Problem: RH Tub/Shower Transfers Goal: LTG Patient will perform tub/shower transfers w/assist (OT) Description: LTG: Patient will perform tub/shower transfers with assist, with/without cues using equipment  (OT) Outcome: Completed/Met   

## 2023-02-20 NOTE — Progress Notes (Signed)
Physical Therapy Discharge Summary  Patient Details  Name: Donald Todd MRN: [id number] Date of Birth: Apr 27, 1929  Date of Discharge from PT service:February 20, 2023  Today's Date: 02/20/2023 PT Individual Time: 1000-1056, [id number] PT Individual Time Calculation (min): 56 min, 47 min    Patient has met 10 of 11 long term goals due to improved activity tolerance, improved balance, increased strength, decreased pain, and ability to compensate for deficits.  Patient to discharge at an ambulatory level Supervision.   Patient's care partner is independent to provide the necessary physical assistance at discharge.Pt TUG score improved from 60 seconds to 26 seconds. Pt 5xSTS score improved from 25.40 seconds to 16.14 seconds. Pt FGA improved from 15/30 to 19/30. Pt ambulated 690 feet during 6 minute walk with 3 standing rest breaks, and one minor LOB able to self correct with UE support on wall.   Reasons goals not met: Stair goal set to supervision, pt required CGA for stair navigation for safety due to intermittent dizziness, vision impairment.   Recommendation:  Patient will benefit from ongoing skilled PT services in home health setting to continue to advance safe functional mobility, address ongoing impairments in strength, balance, endurance, home safety, gait, and, and minimize fall risk.  Equipment: rollator  Reasons for discharge: treatment goals met and discharge from hospital  Patient/family agrees with progress made and goals achieved: Yes  PT Discharge Precautions/Restrictions Precautions Precautions: Fall Precaution Comments: Legally blind in R eye Required Braces or Orthoses: Splint/Cast Splint/Cast: wrist cock up splint for R wrist pain Restrictions Weight Bearing Restrictions: No   Pain Pain Assessment Pain Scale: 0-10 Pain Score: 0-No pain Pain Interference Pain Interference Pain Effect on Sleep: 1. Rarely or not at all Pain Interference with Therapy  Activities: 2. Occasionally Pain Interference with Day-to-Day Activities: 1. Rarely or not at all Vision/Perception  Vision - History Ability to See in Adequate Light: 2 Moderately impaired Vision - Assessment Eye Alignment: Within Functional Limits Ocular Range of Motion: Within Functional Limits Alignment/Gaze Preference: Within Defined Limits Tracking/Visual Pursuits: Decreased smoothness of horizontal tracking;Decreased smoothness of vertical tracking Saccades: Decreased speed of saccadic movement Convergence: Impaired (comment) (R eye demos no convergence with finger to nose test. Decrease L convergence.) Perception Perception: Within Functional Limits Praxis Praxis: Intact  Cognition Overall Cognitive Status: Within Functional Limits for tasks assessed Arousal/Alertness: Awake/alert Orientation Level: Oriented X4 Memory: Appears intact Awareness: Appears intact Problem Solving: Appears intact Safety/Judgment: Appears intact Sensation Sensation Light Touch: Appears Intact (LE sensation appears intact) Hot/Cold: Appears Intact Proprioception: Appears Intact Stereognosis: Not tested Coordination Gross Motor Movements are Fluid and Coordinated: Yes Fine Motor Movements are Fluid and Coordinated: Yes Motor  Motor Motor: Within Functional Limits  Mobility Bed Mobility Bed Mobility: Rolling Right;Sit to Supine;Rolling Left;Supine to Sit Rolling Right: Independent Rolling Left: Independent Supine to Sit: Independent Sitting - Scoot to Edge of Bed: Independent Sit to Supine: Independent Transfers Transfers: Sit to Stand;Stand to [employer] Transfers Sit to Stand: Independent with assistive device Stand to Sit: Independent with assistive device Stand Pivot Transfers: Independent with assistive device Transfer (Assistive device): Rollator Locomotion  Gait Ambulation: Yes Gait Assistance: Supervision/Verbal cueing Gait Distance (Feet): 216 Feet Assistive device:  Rollator Gait Assistance Details: Verbal cues for safe use of DME/AE;Verbal cues for technique;Tactile cues for posture;Verbal cues for precautions/safety Gait Gait: Yes Gait Pattern: Impaired Gait Pattern: Trunk flexed;Decreased stride length;Step-through pattern Gait velocity: decreased Stairs / Additional Locomotion Stairs: Yes Stairs Assistance: Contact Guard/Touching assist Stair Management Technique: One  rail Right;Sideways;Step to pattern Number of Stairs: 12 Height of Stairs: 3 Ramp: Supervision/Verbal cueing Curb: Contact Guard/Touching assist Pick up small object from the floor assist level: Supervision/Verbal cueing Wheelchair Mobility Wheelchair Mobility: No  Trunk/Postural Assessment  Cervical Assessment Cervical Assessment: Exceptions to [facility] (Forward head) Thoracic Assessment Thoracic Assessment: Exceptions to [facility] (Kyphosis and rounded shoulders) Lumbar Assessment Lumbar Assessment: Exceptions to [facility] (posterior tilt) Postural Control Postural Control: Within Functional Limits  Balance Balance Balance Assessed: Yes Standardized Balance Assessment Standardized Balance Assessment: Timed Up and Go Test;Functional Gait Assessment Timed Up and Go Test TUG: Normal TUG Normal TUG (seconds): 26 (average of two trials with rollator) Functional Gait  Assessment Gait assessed : Yes Gait Level Surface: Walks 20 ft in less than 7 sec but greater than 5.5 sec, uses assistive device, slower speed, mild gait deviations, or deviates 6-10 in outside of the 12 in walkway width. Change in Gait Speed: Able to change speed, demonstrates mild gait deviations, deviates 6-10 in outside of the 12 in walkway width, or no gait deviations, unable to achieve a major change in velocity, or uses a change in velocity, or uses an assistive device. Gait with Horizontal Head Turns: Performs head turns smoothly with slight change in gait velocity (eg, minor disruption to smooth gait path), deviates  6-10 in outside 12 in walkway width, or uses an assistive device. Gait with Vertical Head Turns: Performs task with slight change in gait velocity (eg, minor disruption to smooth gait path), deviates 6 - 10 in outside 12 in walkway width or uses assistive device Gait and Pivot Turn: Pivot turns safely in greater than 3 sec and stops with no loss of balance, or pivot turns safely within 3 sec and stops with mild imbalance, requires small steps to catch balance. Step Over Obstacle: Is able to step over one shoe box (4.5 in total height) but must slow down and adjust steps to clear box safely. May require verbal cueing. Gait with Narrow Base of Support: Ambulates 7-9 steps. Gait with Eyes Closed: Walks 20 ft, uses assistive device, slower speed, mild gait deviations, deviates 6-10 in outside 12 in walkway width. Ambulates 20 ft in less than 9 sec but greater than 7 sec. Ambulating Backwards: Walks 20 ft, uses assistive device, slower speed, mild gait deviations, deviates 6-10 in outside 12 in walkway width. Steps: Alternating feet, must use rail. Total Score: 19 Extremity Assessment  RUE Assessment RUE Assessment: Exceptions to [facility] General Strength Comments: FF 0-30, abd 0-40 full elbow wrist and digit LUE Assessment LUE Assessment: Exceptions to [facility] General Strength Comments: FF 0-90 RLE Assessment RLE Assessment: Exceptions to [facility] General Strength Comments: grossly 4/5 LLE Assessment LLE Assessment: Exceptions to [facility] General Strength Comments: Grossly 3+/5   Skilled Intervention   Morning Session:  Pt seated in WC upon arrival. Pt agreeable to therapy. Treatment session focused on discharge planning and reassessment. Pt ambulated >150 feet from room to ADL apartment with rollator and supervision. Pt performed all bed mobility independently on ADL bed. Pt ascended/descended 12 3 inch steps with B UE support on R ascending handrail, and CGA for safety due to intermittent dizziness, and  decreased vision. Pt performed car transfer independently with rollator. Pt ascended/descended ramp with rollator and supervision, cuing provided for safety with descending. Pt picked up cone off of floor with L UE support on rollator. Pt performed the following outcome measures: TUG (see above) and 5 time sit to stand from mat table with B UE support  on thighs with reliance of support on back of legs--16.14 seconds. Pt amb throughout session 4 x ~150 feet throughout session (from gym to gym) with rollator and supervision; pt took seated rest breaks on rollator, pt demos improved carry over of locking rollator against wall prior to sitting. Pt demos improved endurance today, as pt didn't request as many seated rest breaks. Pt performed STS and stand pivot transfers throughout session independently with rollator. Pt in [place] at end of session with chair alarm on and needs within reach.   Afternoon Session:   Pt seated in WC upon arrival finishing lunch and nurse administering medications. Pt denies any pain. Treatment session focused on continuance of discharge planning and reassessment. Pt rollator was delivered to pt's room prior to session. Therapist adjusted rollator to patients height. Assessed pt's sensation and ability to transfer from recliner in room. Pt sensation WFL. Pt transferred from recliner independently with rollator. Pt ambulated 690 feet during 6MW with new rollator and supervision, pt required 3 standing rest breaks and demos 1 episode of LOB, pt able to self recover with R UE support on wall. Pt required seated rest break on rollator after completion of 6MW due to fatigue. Pt ambulated with rollator and supervision ~200 feet to main gym with rollator and supervision with standing rest break, and seated rest break once reaching mat table in day room. Pt education provided throughout session to take seated rest breaks at home on rollator as needed for fatigue, dizziness, weakness, etc to reduce  fall risk. Emphasized importance of utilizing rollator vs 3 wheeled walker at home as it has a seat. Pt completed FGA with rollator and supervision (see results above). Pt ambulated ~150 feet from day room to patients room with rollator and supervision for safety. Pt seated in WC with seat belt alarm on and needs within reach at end of session.    Calyn Jennings [facility] [place], [place], DPT  02/20/2023, 11:35 AM

## 2023-02-20 NOTE — Progress Notes (Signed)
Occupational Therapy Discharge Summary  Patient Details  Name: Donald Todd MRN: [id number] Date of Birth: 04/02/1929  Date of Discharge from [place] service:February 20, 2023  Patient has met 9 of 9 long term goals due to improved activity tolerance, improved balance, and improved coordination.  Patient to discharge at overall Supervision level for self-care needs. Patient's care partner is available to provide supervision level assistance at discharge, but per daughter/wife, plan is to potentially hire an aide for showers/bathing and dressing a couple times a week as pt's wife is recovering physically and cannot provide anything over a supervision level if needed.  All goals met  Recommendation:  Patient will benefit from ongoing skilled OT services in home health setting to continue to advance functional skills in the area of BADL, iADL, and Reduce care partner burden.  Equipment: 3 in 1 BSC, TTB (purchasing privately)  Reasons for discharge: treatment goals met  Patient/family agrees with progress made and goals achieved: Yes  OT Discharge Precautions/Restrictions  Precautions Precautions: Fall Precaution Comments: Legally blind in R eye Restrictions Weight Bearing Restrictions: No ADL ADL Equipment Provided: Long-handled sponge Eating: Modified independent Where Assessed-Eating: Wheelchair Grooming: Modified independent Where Assessed-Grooming: Sitting at sink Upper Body Bathing: Modified independent Where Assessed-Upper Body Bathing: Shower Lower Body Bathing: Setup Where Assessed-Lower Body Bathing: Shower Upper Body Dressing: Setup Where Assessed-Upper Body Dressing: Edge of bed Lower Body Dressing: Supervision/safety Where Assessed-Lower Body Dressing: Sitting at sink, Standing at sink Toileting: Setup Where Assessed-Toileting: Glass blower/designer: Close supervision Toilet Transfer Method: Ambulating Agricultural consultant) Science writer: Raised toilet seat, Grab  bars Tub/Shower Transfer: Close supervison Clinical cytogeneticist Method: Ambulating, Sit pivot Tub/Shower Equipment: Facilities manager: Curator Method: Heritage manager: Radio broadcast assistant, Grab bars Vision Baseline Vision/History: 1 Wears Quarry manager blind Patient Visual Report: Blurring of vision Vision Assessment?: Yes Eye Alignment: Within Functional Limits Ocular Range of Motion: Within Functional Limits Alignment/Gaze Preference: Within Defined Limits Tracking/Visual Pursuits: Decreased smoothness of horizontal tracking;Decreased smoothness of vertical tracking Saccades: Decreased speed of saccadic movement Convergence: Within functional limits Visual Fields: No apparent deficits Perception  Perception: Within Functional Limits Praxis Praxis: Intact Cognition Cognition Overall Cognitive Status: Within Functional Limits for tasks assessed Arousal/Alertness: Awake/alert Orientation Level: Person;Place;Situation Person: Oriented Place: Oriented Situation: Oriented Memory: Appears intact Awareness: Appears intact Problem Solving: Appears intact Safety/Judgment: Appears intact Brief Interview for Mental Status (BIMS) Repetition of Three Words (First Attempt): 3 Temporal Orientation: Year: Correct Temporal Orientation: Month: Accurate within 5 days Temporal Orientation: Day: Correct Recall: "Sock": Yes, no cue required Recall: "Blue": Yes, no cue required Recall: "Bed": Yes, no cue required BIMS Summary Score: 15 Sensation Sensation Light Touch: Impaired Detail Peripheral sensation comments: decreased sensation in distal fingers impaired B Hot/Cold: Appears Intact Proprioception: Appears Intact Stereognosis: Not tested Coordination Gross Motor Movements are Fluid and Coordinated: No Fine Motor Movements are Fluid and Coordinated: Yes Motor  Motor Motor: Within Functional  Limits Mobility  Bed Mobility Bed Mobility: Rolling Right;Sit to Supine;Rolling Left;Supine to Sit Rolling Right: Independent Rolling Left: Independent Supine to Sit: Independent Sitting - Scoot to Edge of Bed: Independent Sit to Supine: Independent Transfers Sit to Stand: Independent with assistive device Stand to Sit: Independent with assistive device  Trunk/Postural Assessment  Cervical Assessment Cervical Assessment: Exceptions to [facility] (forward head) Thoracic Assessment Thoracic Assessment: Exceptions to [facility] (kyphotic) Lumbar Assessment Lumbar Assessment: Exceptions to [facility] (posterior pelvic tilt) Postural Control Postural Control: Within Functional Limits  Balance Balance Balance Assessed: Yes Standardized Balance Assessment Standardized Balance Assessment: Timed Up and Go Test;Functional Gait Assessment Timed Up and Go Test TUG: Normal TUG Normal TUG (seconds): 26 (average of two trials with rollator) Functional Gait  Assessment Gait assessed : Yes Gait Level Surface: Walks 20 ft in less than 7 sec but greater than 5.5 sec, uses assistive device, slower speed, mild gait deviations, or deviates 6-10 in outside of the 12 in walkway width. Change in Gait Speed: Able to change speed, demonstrates mild gait deviations, deviates 6-10 in outside of the 12 in walkway width, or no gait deviations, unable to achieve a major change in velocity, or uses a change in velocity, or uses an assistive device. Gait with Horizontal Head Turns: Performs head turns smoothly with slight change in gait velocity (eg, minor disruption to smooth gait path), deviates 6-10 in outside 12 in walkway width, or uses an assistive device. Gait with Vertical Head Turns: Performs task with slight change in gait velocity (eg, minor disruption to smooth gait path), deviates 6 - 10 in outside 12 in walkway width or uses assistive device Gait and Pivot Turn: Pivot turns safely in greater than 3 sec and stops with no  loss of balance, or pivot turns safely within 3 sec and stops with mild imbalance, requires small steps to catch balance. Step Over Obstacle: Is able to step over one shoe box (4.5 in total height) but must slow down and adjust steps to clear box safely. May require verbal cueing. Gait with Narrow Base of Support: Ambulates 7-9 steps. Gait with Eyes Closed: Walks 20 ft, uses assistive device, slower speed, mild gait deviations, deviates 6-10 in outside 12 in walkway width. Ambulates 20 ft in less than 9 sec but greater than 7 sec. Ambulating Backwards: Walks 20 ft, uses assistive device, slower speed, mild gait deviations, deviates 6-10 in outside 12 in walkway width. Steps: Alternating feet, must use rail. Total Score: 19 Extremity/Trunk Assessment RUE Assessment RUE Assessment: Exceptions to [facility] General Strength Comments: FF 0-30, abd 0-40 full elbow wrist and digit LUE Assessment LUE Assessment: Exceptions to [facility] General Strength Comments: FF 0-90  Tea Collums E Garald Rhew, MS, OTR/L  02/20/2023, 3:29 PM

## 2023-02-20 NOTE — Progress Notes (Signed)
Inpatient Rehabilitation Care Coordinator Discharge Note   Patient Details  Name: KIYAAN BLANKENBILLER MRN: [id number] Date of Birth: [birth year]   Discharge location: Home with spouse  Length of Stay: 11 Days  Discharge activity level: Supervision  Home/community participation: spouse  Patient response [id number] Literacy - How often do you need to have someone help you when you read instructions, pamphlets, or other written material from your doctor or pharmacy?: Never  Patient response [id number] Isolation - How often do you feel lonely or isolated from those around you?: Never  Services provided included: MD, RD, PT, OT, SLP, RN, CM, TR, Pharmacy, Neuropsych, SW  Financial Services:  Charity fundraiser Utilized: Medicare    Choices offered to/list presented to: Patient and spouse  Follow-up services arranged:  Home Health, DME Home Health Agency: [facility]    DME : [facility]    Patient response to transportation need: Is the patient able to respond to transportation needs?: Yes In the past 12 months, has lack of transportation kept you from medical appointments or from getting medications?: No In the past 12 months, has lack of transportation kept you from meetings, work, or from getting things needed for daily living?: No    Comments (or additional information):  Patient/Family verbalized understanding of follow-up arrangements:  Yes  Individual responsible for coordination of the follow-up plan: [name] [phone]  Confirmed correct DME delivered: Dyanne Iha 02/20/2023    Dyanne Iha

## 2023-02-20 NOTE — Progress Notes (Signed)
Occupational Therapy Session Note  Patient Details  Name: Donald Todd MRN: [id number] Date of Birth: 02/18/1929  Today's Date: 02/20/2023 OT Individual Time: 0805-0900 & 1445-1520 OT Individual Time Calculation (min): 55 min & 35 min OT missed time: 10 min Missed time reason: fatigue  Short Term Goals: Week 1:  OT Short Term Goal 1 (Week 1): STG=LTG d/t ELOS  Skilled Therapeutic Interventions/Progress Updates:  Session 1 Skilled OT intervention completed with focus on ADL retraining and endurance within a shower context. Pt received upright in bed, agreeable to session. No pain reported.  Agreeable to shower. Transitioned to EOB with mod I with HOB elevated (he has this feature at home) but no bed rail assist. Sit > stand with mod I without AD, did attempt to lock rollator once standing vs before but was able to correct himself without cues. Ambulated with supervision to toilet using rollator with minor LOB posteriorly when ambulating over threshold but able to correct without assist. Attempted voiding trial as foley was removed, however pt unable to void; nursing aware with routine bladder scanning. Doffed clothing from seated position with mod I.   Mod I sit > stand with grab bar, then ambulated to shower bench with supervision using rollator. Able to shower at the seated level with set up A this session, with lateral leans for buttocks/peri areas to simulate bathing recommendations at home. CGA stand pivot to w/c, then able to dress UB with set up A, LB with use of forward lean vs reacher today, but did require min A for socks even using sock aid, then shoes with set up A. Able to donn R wrist brace with min A for correct placement of straps.  Pt remained seated in w/c, with chair alarm on/activated, and with all needs in reach at end of session.  Session 2 Skilled OT intervention completed with focus on ambulatory endurance, higher level dynamic balance without UE support. Pt  received semi-supine in bed, agreeable to session. No pain reported.  Transitioned to EOB with mod I without bed rail assist. Donned shoes with set up A. Supervision sit > stand using rollator with good demo of safety with locking brakes prior to standing. Ambulated during session with supervision with rollator.   Ambulated to gym, then participated in the following: -sit > stand from EOM, remained standing with supervision without AD during placement/retrieval of squigz with R hand -upgraded task to sit > stand on airex pad requiring CGA to stand but min A to maintain balance on 1st trial due to posterior bias. With increased time, and cues for anterior weight shifting pt was able to improve balance to CGA level  Pt expressed fatigue, with full day of therapy therefore ambulated back to room. Transitioned into bed with mod I. Pt missed 10 mins of OT intervention due to fatigue.  Pt remained semi-supine in bed, with bed alarm on/activated, and with all needs in reach at end of session.   Therapy Documentation Precautions:  Precautions Precautions: Fall Restrictions Weight Bearing Restrictions: No    Therapy/Group: Individual Therapy  Blase Mess, MS, OTR/L  02/20/2023, 3:25 PM

## 2023-02-20 NOTE — Progress Notes (Signed)
PROGRESS NOTE   Subjective/Complaints: Wrist pain is improved He has required cath twice in last 24 hours, wife unwilling to cath him at home, will discuss foley reinsertion  ROS: +constipation, +cough-improving, +visual deficits, +dizziness, +history of urinary retention recently-continues, +right wrist pain-improving. Denies CP, SOB, abd pain, n/v/d.    Objective:   No results found. Recent Labs    02/20/23 0527  WBC 8.7  HGB 10.7*  HCT 32.3*  PLT 185    Recent Labs    02/20/23 0527  NA 133*  K 5.1  CL 103  CO2 22  GLUCOSE 134*  BUN 40*  CREATININE 1.46*  CALCIUM 8.6*     Intake/Output Summary (Last 24 hours) at 02/20/2023 1059 Last data filed at 02/20/2023 0500 Gross per 24 hour  Intake 480 ml  Output 600 ml  Net -120 ml        Physical Exam: Vital Signs Blood pressure 121/84, pulse 73, temperature 98.1 F (36.7 C), resp. rate 16, height 5\' 7"  (1.702 m), weight 56.8 kg, SpO2 98 %.  Constitutional:  NAD, laying in bed HENT: [place]/AT, MMM Eyes: PERRL, EOMI, poor vision Cardiovascular: RRR, nl s1/s2, no m/r/g, no pedal edema Pulmonary: CTAB on anterior fields, did not auscultate posterior fields this morning, no w/r/r appreciated. No cough during exam.  Abdominal: Soft, NTND, +BS throughout, no r/g/r Musculoskeletal: R wrist in splint.  Skin:    General: Skin is warm.     Findings: Bruising present. Psych: pleasant and cooperative, in good spirits  PRIOR EXAM: Neurological:     Mental Status: He is alert.     Comments: Alert and oriented x 3. Normal insight and awareness. Intact Memory. Normal language and speech. Cranial nerve exam unremarkable. UE 5/5 prox to distal. LE: 4- HF, KE and 4+/5 ADF/PF. Sensory exam normal for light touch and pain in all 4 limbs. No limb ataxia or cerebellar signs. No abnormal tone appreciated.   Ambulating with RW Psychiatric:        Mood and Affect: Mood normal.         Behavior: Behavior normal.  GU: retaining urine   Assessment/Plan: 1. Functional deficits which require 3+ hours per day of interdisciplinary therapy in a comprehensive inpatient rehab setting. Physiatrist is providing close team supervision and 24 hour management of active medical problems listed below. Physiatrist and rehab team continue to assess barriers to discharge/monitor patient progress toward functional and medical goals  Care Tool:  Bathing    Body parts bathed by patient: Right arm, Left arm, Chest, Abdomen, Front perineal area, Right upper leg, Left upper leg, Right lower leg, Left lower leg, Face, Buttocks   Body parts bathed by helper: Buttocks     Bathing assist Assist Level: Set up assist     Upper Body Dressing/Undressing Upper body dressing   What is the patient wearing?: Pull over shirt    Upper body assist Assist Level: Set up assist    Lower Body Dressing/Undressing Lower body dressing      What is the patient wearing?: Pants, Incontinence brief     Lower body assist Assist for lower body dressing: Supervision/Verbal cueing     Toileting  Toileting    Toileting assist Assist for toileting: Set up assist     Transfers Chair/bed transfer  Transfers assist     Chair/bed transfer assist level: Supervision/Verbal cueing     Locomotion Ambulation   Ambulation assist      Assist level: Supervision/Verbal cueing Assistive device: Rollator Max distance: 216 ft   Walk 10 feet activity   Assist     Assist level: Supervision/Verbal cueing Assistive device: Rollator   Walk 50 feet activity   Assist    Assist level: Supervision/Verbal cueing Assistive device: Rollator    Walk 150 feet activity   Assist    Assist level: Supervision/Verbal cueing Assistive device: Rollator    Walk 10 feet on uneven surface  activity   Assist Walk 10 feet on uneven surfaces activity did not occur: Safety/medical  concerns   Assist level: Contact Guard/Touching assist Assistive device: Rollator   Wheelchair     Assist Is the patient using a wheelchair?: No             Wheelchair 50 feet with 2 turns activity    Assist            Wheelchair 150 feet activity     Assist          Blood pressure 121/84, pulse 73, temperature 98.1 F (36.7 C), resp. rate 16, height 5\' 7"  (1.702 m), weight 56.8 kg, SpO2 98 %.  Medical Problem List and Plan: 1. Functional deficits secondary to debility related to COVID and multiple medical issues             -patient may shower -ELOS/Goals: d/c 02/21/23 goal; 10-12 days, supervision to mod I goals with PT, OT  -Continue CIR 2.  Antithrombotics: -DVT/anticoagulation:  Pharmaceutical: Lovenox 30mg  QD--discussed with pt the need for lovenox injections today.              -antiplatelet therapy: ASA 81mg  QD  3. Pain Management: Tylenol prn. Voltaren gel QID 4. Mood/Behavior/Sleep: LCSW to follow for evaluation and support  -antipsychotic agents: continue Seroquel 25mg  at bedtime  -Trazodone 25-50mg  QHS PRN  5. Neuropsych/cognition: This patient is capable of making decisions on his own behalf. 6. Skin/Wound Care: Routine pressure relief measures.  7. Fluids/Electrolytes/Nutrition: Monitor I/O. Monitor weekly labs next 02/20/23             --Ensure added for malnutrition.  -02/18/23 labs this week stable, monitor on weekly labs  8. Covid PNA: Has completed Paxlovid/decadron. Respiratory precautions thru 03/11-completed.              --pulmonary hygiene with flutter valve.  -Cont Claritin 10mg  QD, Cepacol lozenges TID, Chloraseptic spray PRN, RobitussinDM PRN, Benadryl PRN, Atrovent nasal spray PRN  9. Delirium w/agitation/hallucinations: Monitor sleep/wake cycle. Continue Seroquel. Likes Seroquel, thinks it's helping, wants to go home with it.   10. Hyponatremia: Na reviewed, up to 132 on 3/12, monitor weekly labs 11. Acute on CKD: SCr was  1.4. Has had left nephrectomy for CA.   --has had worsening of renal status BUN/Scr 22/1.45-->45/1.55 --Encourage fluids. Recheck in am.   -02/14/23 BUN/Cr stable, 31/1.33. Monitor on labs next 02/20/23  12.  Anemia/thrombocytopenia: admission Hgb/Plt-11.2/91 -->12.2/151             --Likely improved due to hemoconcentration. Recheck in am.   -02/14/23 labs stabilizing, monitor weekly, next 02/20/23  13. HTN and CAD s/p PTCA: Continue ASA and Lipitor 40mg  QD. BB was d/c due to bradycardia. On Amlodipine 10mg  QD.              --  has been off enalapril due to acute on chronic renal failure.   -3/16-17/24 BPs stable, cont regimen Vitals:   02/16/23 1359 02/16/23 2005 02/17/23 0513 02/17/23 1337  BP: 107/60 116/63 133/61 124/65   02/17/23 1955 02/18/23 0514 02/18/23 1317 02/18/23 1923  BP: 134/63 123/63 119/62 124/61   02/19/23 0523 02/19/23 1424 02/19/23 1930 02/20/23 0504  BP: 118/66 130/68 131/71 121/84    14. Chronic dizziness/Legally blind (macula degeneration): continue Meclizine 25mg  prn. Will educate patient that we can try this medicine --Fall precautions.  -Continue prosight BID 15. UTI: continue Keflex 500mg  BID.  Stop date of 7 days ordered. End 02/20/23 PM dose 16. Cough: CXR ordered and shows atelectasis, SLP ordered since worse with eating and drinking. Cleared by SLP. Improved 17. Atelectasis: Recommend  incentive spirometer to improve breathing strength and ability over time. Discussed that using this can help to prevent pneumonia 18. Urinary retention: Hx of prostate and renal cancer s/p L nephrectomy, with acute urinary retention 02/11/23, Dr. Kathrynn Ducking of Urology placed 12Fr foley; plan for trial of void after 7-10 days, discussed with patient. Started Flomax 0.4mg  QD on 02/16/23 -02/19/23 foley removed this AM, TOV begins today; monitor 3/18: will discuss reinserting foley as requiring I/O cath and wife is unwilling to do this at home.  19. 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 -3/16-17/24 improving, cont regimen/brace, pt wants to know if he'll go home with this-- defer to weekday team 3/18: discussed improvement and that he will go home with brace.  20. Acute on chronic constipation: takes miralax/colace at home -02/18/23 no BM in 3 days, will start Miralax 17g QD and Colace 100mg  QD for now; monitor for effect.  -02/19/23 still no BM, if no BM by Mon 02/20/23 then consider sorbitol+enema 3/18: messaged nursing to check on BM     LOS: 10 days A FACE TO FACE EVALUATION WAS PERFORMED  Martha Clan P Khamia Stambaugh 02/20/2023, 10:59 AM

## 2023-02-20 NOTE — Plan of Care (Signed)
  Problem: RH Balance Goal: LTG Patient will maintain dynamic sitting balance (PT) Description: LTG:  Patient will maintain dynamic sitting balance with assistance during mobility activities (PT) Outcome: Completed/Met Goal: LTG Patient will maintain dynamic standing balance (PT) Description: LTG:  Patient will maintain dynamic standing balance with assistance during mobility activities (PT) Outcome: Completed/Met   Problem: Sit to Stand Goal: LTG:  Patient will perform sit to stand with assistance level (PT) Description: LTG:  Patient will perform sit to stand with assistance level (PT) Outcome: Completed/Met   Problem: RH Bed Mobility Goal: LTG Patient will perform bed mobility with assist (PT) Description: LTG: Patient will perform bed mobility with assistance, with/without cues (PT). Outcome: Completed/Met   Problem: RH Bed to Chair Transfers Goal: LTG Patient will perform bed/chair transfers w/assist (PT) Description: LTG: Patient will perform bed to chair transfers with assistance (PT). Outcome: Completed/Met   Problem: RH Car Transfers Goal: LTG Patient will perform car transfers with assist (PT) Description: LTG: Patient will perform car transfers with assistance (PT). Outcome: Completed/Met   Problem: RH Furniture Transfers Goal: LTG Patient will perform furniture transfers w/assist (OT/PT) Description: LTG: Patient will perform furniture transfers  with assistance (OT/PT). Outcome: Completed/Met   Problem: RH Ambulation Goal: LTG Patient will ambulate in controlled environment (PT) Description: LTG: Patient will ambulate in a controlled environment, # of feet with assistance (PT). Outcome: Completed/Met Goal: LTG Patient will ambulate in home environment (PT) Description: LTG: Patient will ambulate in home environment, # of feet with assistance (PT). Outcome: Completed/Met Goal: LTG Patient will ambulate in community environment (PT) Description: LTG: Patient will  ambulate in community environment, # of feet with assistance (PT). Outcome: Completed/Met   Problem: RH Stairs Goal: LTG Patient will ambulate up and down stairs w/assist (PT) Description: LTG: Patient will ambulate up and down # of stairs with assistance (PT) Outcome: Not Met (add Reason)  Stair goal not met as pt requires CGA with stair navigation due to dizziness, vision impairments and fatigue.

## 2023-02-20 NOTE — Plan of Care (Signed)
Requiring I+O cath q 8 hrs post foley removal; void trial.

## 2023-02-21 DIAGNOSIS — R053 Chronic cough: Secondary | ICD-10-CM | POA: Insufficient documentation

## 2023-02-21 DIAGNOSIS — M13831 Other specified arthritis, right wrist: Secondary | ICD-10-CM

## 2023-02-21 DIAGNOSIS — R339 Retention of urine, unspecified: Secondary | ICD-10-CM | POA: Insufficient documentation

## 2023-02-21 DIAGNOSIS — N390 Urinary tract infection, site not specified: Secondary | ICD-10-CM | POA: Insufficient documentation

## 2023-02-21 DIAGNOSIS — M25531 Pain in right wrist: Secondary | ICD-10-CM | POA: Insufficient documentation

## 2023-02-21 DIAGNOSIS — K5900 Constipation, unspecified: Secondary | ICD-10-CM | POA: Insufficient documentation

## 2023-02-21 LAB — CBC WITH DIFFERENTIAL/PLATELET
Abs Immature Granulocytes: 0.04 10*3/uL (ref 0.00–0.07)
Basophils Absolute: 0 10*3/uL (ref 0.0–0.1)
Basophils Relative: 0 %
Eosinophils Absolute: 0 10*3/uL (ref 0.0–0.5)
Eosinophils Relative: 0 %
HCT: 32.2 % — ABNORMAL LOW (ref 39.0–52.0)
Hemoglobin: 11 g/dL — ABNORMAL LOW (ref 13.0–17.0)
Immature Granulocytes: 1 %
Lymphocytes Relative: 9 %
Lymphs Abs: 0.5 10*3/uL — ABNORMAL LOW (ref 0.7–4.0)
MCH: 31 pg (ref 26.0–34.0)
MCHC: 34.2 g/dL (ref 30.0–36.0)
MCV: 90.7 fL (ref 80.0–100.0)
Monocytes Absolute: 0.2 10*3/uL (ref 0.1–1.0)
Monocytes Relative: 4 %
Neutro Abs: 4.9 10*3/uL (ref 1.7–7.7)
Neutrophils Relative %: 86 %
Platelets: 160 10*3/uL (ref 150–400)
RBC: 3.55 MIL/uL — ABNORMAL LOW (ref 4.22–5.81)
RDW: 13 % (ref 11.5–15.5)
WBC: 5.7 10*3/uL (ref 4.0–10.5)
nRBC: 0 % (ref 0.0–0.2)

## 2023-02-21 MED ORDER — CHLORHEXIDINE GLUCONATE CLOTH 2 % EX PADS: 6.0000 | MEDICATED_PAD | Freq: Two times a day (BID) | CUTANEOUS | Status: DC

## 2023-02-21 MED ORDER — TAMSULOSIN HCL 0.4 MG PO CAPS: 0.4000 mg | ORAL_CAPSULE | Freq: Every day | ORAL | 0 refills | Status: DC

## 2023-02-21 MED ORDER — ACETAMINOPHEN 325 MG PO TABS: 325.0000 mg | ORAL_TABLET | ORAL | Status: DC | PRN

## 2023-02-21 MED ORDER — PREDNISONE 10 MG (21) PO TBPK: ORAL_TABLET | ORAL | Status: DC

## 2023-02-21 MED ORDER — DICLOFENAC SODIUM 1 % EX GEL: 2.0000 g | Freq: Four times a day (QID) | CUTANEOUS | 0 refills | Status: DC

## 2023-02-21 MED ORDER — QUETIAPINE FUMARATE 25 MG PO TABS: 25.0000 mg | ORAL_TABLET | Freq: Every day | ORAL | 0 refills | Status: DC

## 2023-02-21 NOTE — Progress Notes (Signed)
Patient discharged to home, accompanied by his wife and daughter.

## 2023-02-21 NOTE — Progress Notes (Signed)
Inpatient Rehabilitation Discharge Medication Review by a Pharmacist   A complete drug regimen review was completed for this patient to identify any potential clinically significant medication issues.   High Risk Drug Classes Is patient taking? Indication by Medication  Antipsychotic Yes Seroquel - delirium w/agitation/hallucinations   Anticoagulant No   Antibiotic No    Opioid No    Antiplatelet Yes Aspirin - CAD  Hypoglycemics/insulin No    Vasoactive Medication Yes Amlodipine - HTN Nitroglycerin - angina  Chemotherapy No    Other Yes Atorvastatin - HLD Atrovent nasal spray, loratadine- allergies Prednisone - COVID Flomax- BPH Meclizine - dizziness Miralax, docusate- constipation Calcium carbonate- indigestion/heartburn Diclofenac topical gel, acetaminophen-pain relief        Type of Medication Issue Identified Description of Issue Recommendation(s)  Drug Interaction(s) (clinically significant)        Duplicate Therapy        Allergy        No Medication Administration End Date        Incorrect Dose        Additional Drug Therapy Needed        Significant med changes from prior encounter (inform family/care partners about these prior to discharge).      Other          Clinically significant medication issues were identified that warrant physician communication and completion of prescribed/recommended actions by midnight of the next day:  No     Time spent performing this drug regimen review (minutes): [place], [place] Clinical Pharmacist 02/21/2023 9:53 AM

## 2023-02-21 NOTE — Progress Notes (Signed)
Patient ID: Donald Todd, male   DOB: March 27, 1929, 89 y.o.   MRN: [id number]  SW followed up with patient, spouse and daughter with questions and concerns for d/c. Sw discussed HC and HH. PA will review discharge instructions with family. No contact information previously provided, no additional questions or concerns.

## 2023-02-21 NOTE — Progress Notes (Signed)
Patient, wife and daughter taught how to change the large foley bag for a leg bag.

## 2023-02-21 NOTE — Discharge Summary (Signed)
Physician Discharge Summary  Patient ID: Donald Todd MRN: [id number] DOB/AGE: 02/28/1929 89 y.o.  Admit date: 02/10/2023 Discharge date: 02/21/2023  Discharge Diagnoses:  Principal Problem:   Debility Active Problems:   Essential hypertension   Chronic renal impairment, stage 3 (moderate) (HCC)   Thrombocytopenia (HCC)   Right wrist pain   UTI (urinary tract infection)   Cough, persistent post Covid 19 infecton   Constipation   Urinary retention   Other specified arthritis, right wrist--CPPD arthropathy per x rays   Discharged Condition: stable  Significant Diagnostic Studies: DG Wrist 2 Views Right  Result Date: 02/17/2023 CLINICAL DATA:  Pain, atraumatic right wrist pain EXAM: RIGHT WRIST - 2 VIEW COMPARISON:  Right hand radiograph 03/14/2017 FINDINGS: There is no evidence of acute fracture. Unchanged scapholunate diastasis. Radiocarpal and TFCC chondrocalcinosis. There is dorsal tilting of the lunate. Moderate-severe triscaphe arthritis with bony remodeling. Moderate third MCP joint space narrowing with chondrocalcinosis. Mild soft tissue swelling of the wrist. IMPRESSION: No acute osseous abnormality. 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 Mild dorsal tilt of the lunate. Chondrocalcinosis of the radiocarpal joint, TFCC, and third MCP joint suggesting CPPD arthropathy. 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 Electronically Signed   By: Maurine Simmering M.D.   On: 02/17/2023 10:22   DG Chest 2 View  Result Date: 02/13/2023 CLINICAL DATA:  Cough for several days. History of CAD and hypertension. EXAM: CHEST - 2 VIEW COMPARISON:  Radiographs 02/03/2023.  CT 02/01/2023. FINDINGS: 1237 hours. The heart size and mediastinal contours are stable with aortic atherosclerosis. Stable low lung volumes with mild right basilar atelectasis. No edema, confluent airspace opacity, pleural effusion or pneumothorax. No acute  osseous findings are evident. There are degenerative changes in the spine associated with a convex right thoracolumbar scoliosis. Previous left shoulder reverse arthroplasty. IMPRESSION: No evidence of acute cardiopulmonary process. Aortic atherosclerosis. Electronically Signed   By: Richardean Sale M.D.   On: 02/13/2023 13:38    Labs:  Basic Metabolic Panel:    Latest Ref Rng & Units 02/20/2023    5:27 AM 02/14/2023   10:55 AM 02/13/2023    6:51 AM  BMP  Glucose 70 - 99 mg/dL 134  156  104   BUN 8 - 23 mg/dL 40  31  33   Creatinine 0.61 - 1.24 mg/dL 1.46  1.33  1.49   Sodium 135 - 145 mmol/L 133  132  131   Potassium 3.5 - 5.1 mmol/L 5.1  4.6  4.6   Chloride 98 - 111 mmol/L 103  96  100   CO2 22 - 32 mmol/L 22  25  22    Calcium 8.9 - 10.3 mg/dL 8.6  8.3  8.1      CBC:    Latest Ref Rng & Units 02/21/2023    5:50 AM 02/20/2023    5:27 AM 02/14/2023   10:55 AM  CBC  WBC 4.0 - 10.5 K/uL 5.7  8.7  11.3   Hemoglobin 13.0 - 17.0 g/dL 11.0  10.7  13.4   Hematocrit 39.0 - 52.0 % 32.2  32.3  39.7   Platelets 150 - 400 K/uL 160  185  190      CBG: No results for input(s): "GLUCAP" in the last 168 hours.  Brief HPI:   Donald Todd is a 89 y.o. male with history of CAD, Renal CA w/intraabdominal/psoas muscle mets, CKD, prostate CA, HTN, legally blind, chronic vertigo who was admitted on  02/03/23 to [facility] with 2 day history of cough, sore throat, weakness and dizziness with poor intake. He was found to be Covid 19 virus and admitted due to concerns of hypoxia. He was started Paxlovid and low dose decadron with improvement in symptoms. Hospital course significant for diarrhea, acute on chronic renal failure as well as delirium with hallucinations. Seroquel was added to help with sleep hygiene. PT/OT was working with patient who was noted to be limited by weakness with unsteady gait and balance deficits. CIR was recommended due to functional decline.     Hospital Course: Donald Todd was  admitted to rehab 02/10/2023 for inpatient therapies to consist of PT and OT at least three hours five days a week. Past admission physiatrist, therapy team and rehab RN have worked together to provide customized collaborative inpatient rehab. His blood pressures were monitored on TID basis and has been stable off enalarpril and BB. He was found to have difficulty voiding with retention and required foley placement by GU on 03/10. Due to rise in WBC as well as positive UA concerning for UTI, he was treated with Keflex X  7 days. Flomax was added and voiding trial attempted after 7 days onn 03/16  but he was unable to void requiring I/O caths therefore foley was replaced. He is to follow up with GU for voiding trial next week.  Follow up CBC showed Leucocytosis has resolved and H/H close to baseline.    Follow up labs showed acute on chronic renal failure showed some improvement in renal status briefly and should improve as foley resumed. Recommend repeat check BMET in a week to monitor renals status.  He did develop right wrist pain with activity and X rays done revealing no change in scapholunate ligament tea and chondrocalcinosis s/o CPPD arthropathy. Brace was ordered for support and ortho consulted for input. They felt that patient could be starting gout flare and to go ahead and treat as such. He was started on steroid taper and was reporting improvement in pain by discharge. Miralax added to help manage constipation and he had good results with this.  He has made steady gains and is currently as supervision level. He will continue to receive follow up HHPT and [place] [facility] after discharge.    Rehab course: During patient's stay in rehab team conference was held to monitor patient's progress, set goals and discuss barriers to discharge. At admission, patient required min assist with basic self care tasks and with mobility.  He has had improvement in activity tolerance, balance, postural  control as well as ability to compensate for deficits. He requires set up assist to supervision with ADL tasks. He is independent for transfers and is able to ambulate 216 feet with supervision and cues for safety. He requires CGA to climb 12 stairs. Family education has been completed.    Discharge disposition: 01-Home or Self Care  Diet:  Regular  Special Instructions: Perform foley care BID Recommend repeat check BMET and CBC in a week to monitor renal status and H/H.    Allergies as of 02/21/2023       Reactions   Tape Hives   Surgical tape - causes blisters   Hydrocodone    Shaking uncontrollably    Iohexol     Desc: PT DOES RECALL REACTION JUST SAYS IT WAS A LONG TIME AGO FOR KIDNEY X-RAYS AND HE DIDN'T TOLERATE IT WELL-ARS 05/02/09-NOTE E-CHART STATES A HOT FEELING IS HIS REACTION, BUT HE  CAN'T CONFIRM THAT WAS ALL   Oxycodone Other (See Comments)   Shaking uncontrollably         Medication List     STOP taking these medications    dexamethasone 2 MG tablet Commonly known as: DECADRON   guaiFENesin-dextromethorphan 100-10 MG/5ML syrup Commonly known as: ROBITUSSIN DM       TAKE these medications    acetaminophen 325 MG tablet Commonly known as: TYLENOL Take 1-2 tablets (325-650 mg total) by mouth every 4 (four) hours as needed for mild pain. What changed:  medication strength how much to take when to take this reasons to take this   amLODipine 10 MG tablet Commonly known as: NORVASC TAKE 1 TABLET DAILY   aspirin 81 MG tablet Take 81 mg by mouth at bedtime.   atorvastatin 40 MG tablet Commonly known as: LIPITOR TAKE 1 TABLET AT BEDTIME   calcium carbonate 500 MG chewable tablet Commonly known as: TUMS - dosed in mg elemental calcium Chew 1,000 mg by mouth daily as needed for indigestion or heartburn.   diclofenac Sodium 1 % Gel Commonly known as: VOLTAREN Apply 2 g topically 4 (four) times daily.   docusate sodium 100 MG capsule Commonly  known as: COLACE Take 1 capsule (100 mg total) by mouth every 12 (twelve) hours. What changed:  how much to take when to take this   ipratropium 0.03 % nasal spray Commonly known as: ATROVENT Place 1-2 sprays into both nostrils 2 (two) times daily as needed (allergies).   loratadine 10 MG tablet Commonly known as: CLARITIN Take 10 mg by mouth daily.   meclizine 25 MG tablet Commonly known as: ANTIVERT Take 25 mg by mouth as needed for dizziness.   nitroGLYCERIN 0.4 MG SL tablet Commonly known as: NITROSTAT Place 1 tablet (0.4 mg total) under the tongue as needed.   polyethylene glycol 17 g packet Commonly known as: MIRALAX / GLYCOLAX Take 17 g by mouth daily.   predniSONE 10 MG (21) Tbpk tablet Commonly known as: STERAPRED UNI-PAK 21 TAB Complete dose pack as per package instructions.   PreserVision AREDS 2 Caps Take 1 capsule by mouth 2 (two) times daily.   QUEtiapine 25 MG tablet Commonly known as: SEROQUEL Take 1 tablet (25 mg total) by mouth at bedtime.   tamsulosin 0.4 MG Caps capsule Commonly known as: FLOMAX Take 1 capsule (0.4 mg total) by mouth daily after supper.        Follow-up Information     [name], MD Follow up.   Specialty: Family Medicine Why: Call in 1-2 days for post hospital follow up Contact information: [address] [place] [address] [phone]         Izora Ribas, MD Follow up.   Specialty: Physical Medicine and Rehabilitation Why: office will call you with follow up appointment Contact information: [id number] [place] [address] [phone]         Ceasar Mons, MD Follow up on 03/02/2023.   Specialty: Urology Why: Appointment with Jory Ee, PA --be there at 9:30 for 9:45 am appointment Contact information: [address] [place] [address] [phone]                 Signed: Bary Leriche 02/21/2023, 10:55 AM

## 2023-02-21 NOTE — Progress Notes (Signed)
Patient ID: Donald Todd, male   DOB: 1929-10-20, 89 y.o.   MRN: [id number] Foley replaced for retention with urology follow up post discharge. Patient given leg bag and a stat lock foley placement holder with instructions for foley care/management and urinal for emptying. [name]

## 2023-02-21 NOTE — Progress Notes (Signed)
PROGRESS NOTE   Subjective/Complaints: Asks whether he should follow-up with his PCP and urology Right wrist pain is well controlled with brace Feels ready for d/c today  ROS: +constipation- improved, +cough-improving, +visual deficits, +dizziness, +history of urinary retention recently-continues, +right wrist pain-improving. Denies CP, SOB, abd pain, n/v/d.    Objective:   No results found. Recent Labs    02/20/23 0527 02/21/23 0550  WBC 8.7 5.7  HGB 10.7* 11.0*  HCT 32.3* 32.2*  PLT 185 160    Recent Labs    02/20/23 0527  NA 133*  K 5.1  CL 103  CO2 22  GLUCOSE 134*  BUN 40*  CREATININE 1.46*  CALCIUM 8.6*     Intake/Output Summary (Last 24 hours) at 02/21/2023 0933 Last data filed at 02/21/2023 0900 Gross per 24 hour  Intake 456 ml  Output 1430 ml  Net -974 ml        Physical Exam: Vital Signs Blood pressure 136/68, pulse 69, temperature 98 F (36.7 C), temperature source Oral, resp. rate 18, height 5\' 7"  (1.702 m), weight 56.8 kg, SpO2 98 %.  Constitutional:  NAD, laying in bed HENT: [place]/AT, MMM Eyes: PERRL, EOMI, poor vision Cardiovascular: RRR, nl s1/s2, no m/r/g, no pedal edema Pulmonary: CTAB on anterior fields, did not auscultate posterior fields this morning, no w/r/r appreciated. No cough during exam.  Abdominal: Soft, NTND, +BS throughout, no r/g/r Musculoskeletal: R wrist in splint.  Skin:    General: Skin is warm.     Findings: Bruising present. Psych: pleasant and cooperative, in good spirits  PRIOR EXAM: Neurological:     Mental Status: He is alert.     Comments: Alert and oriented x 3. Normal insight and awareness. Intact Memory. Normal language and speech. Cranial nerve exam unremarkable. UE 5/5 prox to distal. LE: 4- HF, KE and 4+/5 ADF/PF. Sensory exam normal for light touch and pain in all 4 limbs. No limb ataxia or cerebellar signs. No abnormal tone appreciated.    Ambulating with RW Psychiatric:        Mood and Affect: Mood normal.        Behavior: Behavior normal.  GU: retaining urine, supervision for toilet transfer   Assessment/Plan: 1. Functional deficits which require 3+ hours per day of interdisciplinary therapy in a comprehensive inpatient rehab setting. Physiatrist is providing close team supervision and 24 hour management of active medical problems listed below. Physiatrist and rehab team continue to assess barriers to discharge/monitor patient progress toward functional and medical goals  Care Tool:  Bathing    Body parts bathed by patient: Right arm, Left arm, Chest, Abdomen, Front perineal area, Right upper leg, Left upper leg, Right lower leg, Left lower leg, Face, Buttocks   Body parts bathed by helper: Buttocks     Bathing assist Assist Level: Set up assist     Upper Body Dressing/Undressing Upper body dressing   What is the patient wearing?: Pull over shirt    Upper body assist Assist Level: Set up assist    Lower Body Dressing/Undressing Lower body dressing      What is the patient wearing?: Pants, Incontinence brief     Lower body  assist Assist for lower body dressing: Supervision/Verbal cueing     Toileting Toileting    Toileting assist Assist for toileting: Set up assist     Transfers Chair/bed transfer  Transfers assist     Chair/bed transfer assist level: Independent with assistive device Chair/bed transfer assistive device:  (rollator)   Locomotion Ambulation   Ambulation assist      Assist level: Supervision/Verbal cueing Assistive device: Rollator Max distance: 216 ft   Walk 10 feet activity   Assist     Assist level: Supervision/Verbal cueing Assistive device: Rollator   Walk 50 feet activity   Assist    Assist level: Supervision/Verbal cueing Assistive device: Rollator    Walk 150 feet activity   Assist    Assist level: Supervision/Verbal cueing Assistive  device: Rollator    Walk 10 feet on uneven surface  activity   Assist Walk 10 feet on uneven surfaces activity did not occur: Safety/medical concerns   Assist level: Supervision/Verbal cueing Assistive device: Rollator   Wheelchair     Assist Is the patient using a wheelchair?: No             Wheelchair 50 feet with 2 turns activity    Assist            Wheelchair 150 feet activity     Assist          Blood pressure 136/68, pulse 69, temperature 98 F (36.7 C), temperature source Oral, resp. rate 18, height 5\' 7"  (1.702 m), weight 56.8 kg, SpO2 98 %.  Medical Problem List and Plan: 1. Functional deficits secondary to debility related to COVID and multiple medical issues             -patient may shower -ELOS/Goals: d/c 02/21/23 goal; 10-12 days, supervision to mod I goals with PT, OT  -d/c home today 2.  Antithrombotics: -DVT/anticoagulation:  Pharmaceutical: Lovenox 30mg  QD--discussed with pt the need for lovenox injections today.              -antiplatelet therapy: ASA 81mg  QD  3. Pain Management: Tylenol prn. Voltaren gel QID 4. Mood/Behavior/Sleep: LCSW to follow for evaluation and support  -antipsychotic agents: continue Seroquel 25mg  at bedtime  -Trazodone 25-50mg  QHS PRN  5. Neuropsych/cognition: This patient is capable of making decisions on his own behalf. 6. Skin/Wound Care: Routine pressure relief measures.  7. Fluids/Electrolytes/Nutrition: Monitor I/O. Monitor weekly labs next 02/20/23             --Ensure added for malnutrition.  -02/18/23 labs this week stable, monitor on weekly labs  8. Covid PNA: Has completed Paxlovid/decadron. Respiratory precautions thru 03/11-completed.              --pulmonary hygiene with flutter valve.  -Cont Claritin 10mg  QD, Cepacol lozenges TID, Chloraseptic spray PRN, RobitussinDM PRN, Benadryl PRN, Atrovent nasal spray PRN  9. Delirium w/agitation/hallucinations: Monitor sleep/wake cycle. Continue  Seroquel. Likes Seroquel, thinks it's helping, wants to go home with it.   10. Hyponatremia: Na reviewed, up to 132 on 3/12, monitor weekly labs 11. Acute on CKD: SCr was 1.4. Has had left nephrectomy for CA.   --has had worsening of renal status BUN/Scr 22/1.45-->45/1.55 --Encourage fluids. Recheck in am.   -02/14/23 BUN/Cr stable, 31/1.33. Monitor on labs next 02/20/23  12.  Anemia/thrombocytopenia: admission Hgb/Plt-11.2/91 -->12.2/151             --Likely improved due to hemoconcentration. Recheck in am.   -02/14/23 labs stabilizing, monitor weekly, next 02/20/23  13. HTN and CAD s/p PTCA: Continue ASA and Lipitor 40mg  QD. BB was d/c due to bradycardia. On Amlodipine 10mg  QD.              --has been off enalapril due to acute on chronic renal failure.   -3/16-17/24 BPs stable, cont regimen Vitals:   02/17/23 1337 02/17/23 1955 02/18/23 0514 02/18/23 1317  BP: 124/65 134/63 123/63 119/62   02/18/23 1923 02/19/23 0523 02/19/23 1424 02/19/23 1930  BP: 124/61 118/66 130/68 131/71   02/20/23 0504 02/20/23 1920 02/21/23 0530 02/21/23 0834  BP: 121/84 132/64 137/66 136/68    14. Chronic dizziness/Legally blind (macula degeneration): continue Meclizine 25mg  prn. Will educate patient that we can try this medicine --Fall precautions.  -Continue prosight BID 15. UTI: continue Keflex 500mg  BID.  Stop date of 7 days ordered. End 02/20/23 PM dose 16. Cough: CXR ordered and shows atelectasis, SLP ordered since worse with eating and drinking. Cleared by SLP. Improved 17. Atelectasis: Recommend  incentive spirometer to improve breathing strength and ability over time. Discussed that using this can help to prevent pneumonia 18. Urinary retention: Hx of prostate and renal cancer s/p L nephrectomy, with acute urinary retention 02/11/23, Dr. Kathrynn Ducking of Urology placed 12Fr foley; plan for trial of void after 7-10 days, discussed with patient. Started Flomax 0.4mg  QD on 02/16/23 -02/19/23 foley removed this AM, TOV  begins today; monitor 3/18: will discuss reinserting foley as requiring I/O cath and wife is unwilling to do this at home.  19. Right wrist pain: XR obtained and shows moderate severe triscaphe arthritis wit bony remodeling that could be degenerative of CPPD, chondrocalcinosis of radiocarpal joint/TFCC/third MCP suggestive of CPPD arthropathy.schapholunate ligament tear, will consult ortho, voltaren ordered, wrist brace ordered. -3/16-17/24 improving, cont regimen/brace, pt wants to know if he'll go home with this-- defer to weekday team Continue brace  20. Acute on chronic constipation: takes miralax/colace at home -02/18/23 no BM in 3 days, will start Miralax 17g QD and Colace 100mg  QD for now; monitor for effect.  -02/19/23 still no BM, if no BM by Mon 02/20/23 then consider sorbitol+enema Discussed with nursing that he has been having regular BM   >30 minutes spent in discharge of patient including review of medications and follow-up appointments, physical examination, and in answering all patient's questions     LOS: 11 days A FACE TO FACE EVALUATION WAS [employer] 02/21/2023, 9:33 AM

## 2023-02-22 DIAGNOSIS — H548 Legal blindness, as defined in USA: Secondary | ICD-10-CM | POA: Diagnosis not present

## 2023-02-22 DIAGNOSIS — Z8546 Personal history of malignant neoplasm of prostate: Secondary | ICD-10-CM | POA: Diagnosis not present

## 2023-02-22 DIAGNOSIS — Z96612 Presence of left artificial shoulder joint: Secondary | ICD-10-CM | POA: Diagnosis not present

## 2023-02-22 DIAGNOSIS — Z7982 Long term (current) use of aspirin: Secondary | ICD-10-CM | POA: Diagnosis not present

## 2023-02-22 DIAGNOSIS — H3530 Unspecified macular degeneration: Secondary | ICD-10-CM | POA: Diagnosis not present

## 2023-02-22 DIAGNOSIS — Z955 Presence of coronary angioplasty implant and graft: Secondary | ICD-10-CM | POA: Diagnosis not present

## 2023-02-22 DIAGNOSIS — Z556 Problems related to health literacy: Secondary | ICD-10-CM | POA: Diagnosis not present

## 2023-02-22 DIAGNOSIS — I129 Hypertensive chronic kidney disease with stage 1 through stage 4 chronic kidney disease, or unspecified chronic kidney disease: Secondary | ICD-10-CM | POA: Diagnosis not present

## 2023-02-22 DIAGNOSIS — R339 Retention of urine, unspecified: Secondary | ICD-10-CM | POA: Diagnosis not present

## 2023-02-22 DIAGNOSIS — Z7952 Long term (current) use of systemic steroids: Secondary | ICD-10-CM | POA: Diagnosis not present

## 2023-02-22 DIAGNOSIS — K5909 Other constipation: Secondary | ICD-10-CM | POA: Diagnosis not present

## 2023-02-22 DIAGNOSIS — Z87891 Personal history of nicotine dependence: Secondary | ICD-10-CM | POA: Diagnosis not present

## 2023-02-22 DIAGNOSIS — N390 Urinary tract infection, site not specified: Secondary | ICD-10-CM | POA: Diagnosis not present

## 2023-02-22 DIAGNOSIS — J1282 Pneumonia due to coronavirus disease 2019: Secondary | ICD-10-CM | POA: Diagnosis not present

## 2023-02-22 DIAGNOSIS — I2510 Atherosclerotic heart disease of native coronary artery without angina pectoris: Secondary | ICD-10-CM | POA: Diagnosis not present

## 2023-02-22 DIAGNOSIS — N189 Chronic kidney disease, unspecified: Secondary | ICD-10-CM | POA: Diagnosis not present

## 2023-02-22 DIAGNOSIS — E785 Hyperlipidemia, unspecified: Secondary | ICD-10-CM | POA: Diagnosis not present

## 2023-02-22 DIAGNOSIS — U071 COVID-19: Secondary | ICD-10-CM | POA: Diagnosis not present

## 2023-02-22 DIAGNOSIS — I10 Essential (primary) hypertension: Secondary | ICD-10-CM | POA: Diagnosis not present

## 2023-02-22 DIAGNOSIS — D631 Anemia in chronic kidney disease: Secondary | ICD-10-CM | POA: Diagnosis not present

## 2023-02-23 DIAGNOSIS — N189 Chronic kidney disease, unspecified: Secondary | ICD-10-CM | POA: Diagnosis not present

## 2023-02-23 DIAGNOSIS — N390 Urinary tract infection, site not specified: Secondary | ICD-10-CM | POA: Diagnosis not present

## 2023-02-23 DIAGNOSIS — I129 Hypertensive chronic kidney disease with stage 1 through stage 4 chronic kidney disease, or unspecified chronic kidney disease: Secondary | ICD-10-CM | POA: Diagnosis not present

## 2023-02-23 DIAGNOSIS — R339 Retention of urine, unspecified: Secondary | ICD-10-CM | POA: Diagnosis not present

## 2023-02-23 DIAGNOSIS — J1282 Pneumonia due to coronavirus disease 2019: Secondary | ICD-10-CM | POA: Diagnosis not present

## 2023-02-23 DIAGNOSIS — U071 COVID-19: Secondary | ICD-10-CM | POA: Diagnosis not present

## 2023-02-24 DIAGNOSIS — U071 COVID-19: Secondary | ICD-10-CM | POA: Diagnosis not present

## 2023-02-24 DIAGNOSIS — I129 Hypertensive chronic kidney disease with stage 1 through stage 4 chronic kidney disease, or unspecified chronic kidney disease: Secondary | ICD-10-CM | POA: Diagnosis not present

## 2023-02-24 DIAGNOSIS — N189 Chronic kidney disease, unspecified: Secondary | ICD-10-CM | POA: Diagnosis not present

## 2023-02-24 DIAGNOSIS — R339 Retention of urine, unspecified: Secondary | ICD-10-CM | POA: Diagnosis not present

## 2023-02-24 DIAGNOSIS — J1282 Pneumonia due to coronavirus disease 2019: Secondary | ICD-10-CM | POA: Diagnosis not present

## 2023-02-24 DIAGNOSIS — N390 Urinary tract infection, site not specified: Secondary | ICD-10-CM | POA: Diagnosis not present

## 2023-02-28 DIAGNOSIS — M109 Gout, unspecified: Secondary | ICD-10-CM | POA: Diagnosis not present

## 2023-02-28 DIAGNOSIS — U071 COVID-19: Secondary | ICD-10-CM | POA: Diagnosis not present

## 2023-02-28 DIAGNOSIS — N189 Chronic kidney disease, unspecified: Secondary | ICD-10-CM | POA: Diagnosis not present

## 2023-02-28 DIAGNOSIS — R899 Unspecified abnormal finding in specimens from other organs, systems and tissues: Secondary | ICD-10-CM | POA: Diagnosis not present

## 2023-02-28 DIAGNOSIS — Z09 Encounter for follow-up examination after completed treatment for conditions other than malignant neoplasm: Secondary | ICD-10-CM | POA: Diagnosis not present

## 2023-02-28 DIAGNOSIS — R339 Retention of urine, unspecified: Secondary | ICD-10-CM | POA: Diagnosis not present

## 2023-02-28 DIAGNOSIS — R443 Hallucinations, unspecified: Secondary | ICD-10-CM | POA: Diagnosis not present

## 2023-02-28 DIAGNOSIS — J1282 Pneumonia due to coronavirus disease 2019: Secondary | ICD-10-CM | POA: Diagnosis not present

## 2023-02-28 DIAGNOSIS — I129 Hypertensive chronic kidney disease with stage 1 through stage 4 chronic kidney disease, or unspecified chronic kidney disease: Secondary | ICD-10-CM | POA: Diagnosis not present

## 2023-02-28 DIAGNOSIS — N390 Urinary tract infection, site not specified: Secondary | ICD-10-CM | POA: Diagnosis not present

## 2023-02-28 DIAGNOSIS — Z6822 Body mass index (BMI) 22.0-22.9, adult: Secondary | ICD-10-CM | POA: Diagnosis not present

## 2023-02-28 DIAGNOSIS — R531 Weakness: Secondary | ICD-10-CM | POA: Diagnosis not present

## 2023-03-01 DIAGNOSIS — N390 Urinary tract infection, site not specified: Secondary | ICD-10-CM | POA: Diagnosis not present

## 2023-03-01 DIAGNOSIS — U071 COVID-19: Secondary | ICD-10-CM | POA: Diagnosis not present

## 2023-03-01 DIAGNOSIS — N189 Chronic kidney disease, unspecified: Secondary | ICD-10-CM | POA: Diagnosis not present

## 2023-03-01 DIAGNOSIS — J1282 Pneumonia due to coronavirus disease 2019: Secondary | ICD-10-CM | POA: Diagnosis not present

## 2023-03-01 DIAGNOSIS — R339 Retention of urine, unspecified: Secondary | ICD-10-CM | POA: Diagnosis not present

## 2023-03-01 DIAGNOSIS — I129 Hypertensive chronic kidney disease with stage 1 through stage 4 chronic kidney disease, or unspecified chronic kidney disease: Secondary | ICD-10-CM | POA: Diagnosis not present

## 2023-03-02 DIAGNOSIS — U071 COVID-19: Secondary | ICD-10-CM | POA: Diagnosis not present

## 2023-03-02 DIAGNOSIS — R338 Other retention of urine: Secondary | ICD-10-CM | POA: Diagnosis not present

## 2023-03-02 DIAGNOSIS — N390 Urinary tract infection, site not specified: Secondary | ICD-10-CM | POA: Diagnosis not present

## 2023-03-02 DIAGNOSIS — J1282 Pneumonia due to coronavirus disease 2019: Secondary | ICD-10-CM | POA: Diagnosis not present

## 2023-03-02 DIAGNOSIS — I129 Hypertensive chronic kidney disease with stage 1 through stage 4 chronic kidney disease, or unspecified chronic kidney disease: Secondary | ICD-10-CM | POA: Diagnosis not present

## 2023-03-02 DIAGNOSIS — N189 Chronic kidney disease, unspecified: Secondary | ICD-10-CM | POA: Diagnosis not present

## 2023-03-02 DIAGNOSIS — R339 Retention of urine, unspecified: Secondary | ICD-10-CM | POA: Diagnosis not present

## 2023-03-03 DIAGNOSIS — J1282 Pneumonia due to coronavirus disease 2019: Secondary | ICD-10-CM | POA: Diagnosis not present

## 2023-03-03 DIAGNOSIS — R339 Retention of urine, unspecified: Secondary | ICD-10-CM | POA: Diagnosis not present

## 2023-03-03 DIAGNOSIS — I129 Hypertensive chronic kidney disease with stage 1 through stage 4 chronic kidney disease, or unspecified chronic kidney disease: Secondary | ICD-10-CM | POA: Diagnosis not present

## 2023-03-03 DIAGNOSIS — N189 Chronic kidney disease, unspecified: Secondary | ICD-10-CM | POA: Diagnosis not present

## 2023-03-03 DIAGNOSIS — N390 Urinary tract infection, site not specified: Secondary | ICD-10-CM | POA: Diagnosis not present

## 2023-03-03 DIAGNOSIS — U071 COVID-19: Secondary | ICD-10-CM | POA: Diagnosis not present

## 2023-03-07 ENCOUNTER — Other Ambulatory Visit: Payer: Self-pay | Admitting: Cardiovascular Disease

## 2023-03-08 DIAGNOSIS — R339 Retention of urine, unspecified: Secondary | ICD-10-CM | POA: Diagnosis not present

## 2023-03-08 DIAGNOSIS — J1282 Pneumonia due to coronavirus disease 2019: Secondary | ICD-10-CM | POA: Diagnosis not present

## 2023-03-08 DIAGNOSIS — U071 COVID-19: Secondary | ICD-10-CM | POA: Diagnosis not present

## 2023-03-08 DIAGNOSIS — N390 Urinary tract infection, site not specified: Secondary | ICD-10-CM | POA: Diagnosis not present

## 2023-03-08 DIAGNOSIS — I129 Hypertensive chronic kidney disease with stage 1 through stage 4 chronic kidney disease, or unspecified chronic kidney disease: Secondary | ICD-10-CM | POA: Diagnosis not present

## 2023-03-08 DIAGNOSIS — N189 Chronic kidney disease, unspecified: Secondary | ICD-10-CM | POA: Diagnosis not present

## 2023-03-10 DIAGNOSIS — R339 Retention of urine, unspecified: Secondary | ICD-10-CM | POA: Diagnosis not present

## 2023-03-10 DIAGNOSIS — I129 Hypertensive chronic kidney disease with stage 1 through stage 4 chronic kidney disease, or unspecified chronic kidney disease: Secondary | ICD-10-CM | POA: Diagnosis not present

## 2023-03-10 DIAGNOSIS — J1282 Pneumonia due to coronavirus disease 2019: Secondary | ICD-10-CM | POA: Diagnosis not present

## 2023-03-10 DIAGNOSIS — N390 Urinary tract infection, site not specified: Secondary | ICD-10-CM | POA: Diagnosis not present

## 2023-03-10 DIAGNOSIS — N189 Chronic kidney disease, unspecified: Secondary | ICD-10-CM | POA: Diagnosis not present

## 2023-03-10 DIAGNOSIS — U071 COVID-19: Secondary | ICD-10-CM | POA: Diagnosis not present

## 2023-03-13 DIAGNOSIS — N390 Urinary tract infection, site not specified: Secondary | ICD-10-CM | POA: Diagnosis not present

## 2023-03-13 DIAGNOSIS — U071 COVID-19: Secondary | ICD-10-CM | POA: Diagnosis not present

## 2023-03-13 DIAGNOSIS — R339 Retention of urine, unspecified: Secondary | ICD-10-CM | POA: Diagnosis not present

## 2023-03-13 DIAGNOSIS — J1282 Pneumonia due to coronavirus disease 2019: Secondary | ICD-10-CM | POA: Diagnosis not present

## 2023-03-13 DIAGNOSIS — I129 Hypertensive chronic kidney disease with stage 1 through stage 4 chronic kidney disease, or unspecified chronic kidney disease: Secondary | ICD-10-CM | POA: Diagnosis not present

## 2023-03-13 DIAGNOSIS — N189 Chronic kidney disease, unspecified: Secondary | ICD-10-CM | POA: Diagnosis not present

## 2023-03-15 DIAGNOSIS — U071 COVID-19: Secondary | ICD-10-CM | POA: Diagnosis not present

## 2023-03-15 DIAGNOSIS — I129 Hypertensive chronic kidney disease with stage 1 through stage 4 chronic kidney disease, or unspecified chronic kidney disease: Secondary | ICD-10-CM | POA: Diagnosis not present

## 2023-03-15 DIAGNOSIS — N390 Urinary tract infection, site not specified: Secondary | ICD-10-CM | POA: Diagnosis not present

## 2023-03-15 DIAGNOSIS — N189 Chronic kidney disease, unspecified: Secondary | ICD-10-CM | POA: Diagnosis not present

## 2023-03-15 DIAGNOSIS — R339 Retention of urine, unspecified: Secondary | ICD-10-CM | POA: Diagnosis not present

## 2023-03-15 DIAGNOSIS — J1282 Pneumonia due to coronavirus disease 2019: Secondary | ICD-10-CM | POA: Diagnosis not present

## 2023-03-16 DIAGNOSIS — R338 Other retention of urine: Secondary | ICD-10-CM | POA: Diagnosis not present

## 2023-03-16 DIAGNOSIS — N312 Flaccid neuropathic bladder, not elsewhere classified: Secondary | ICD-10-CM | POA: Diagnosis not present

## 2023-03-22 DIAGNOSIS — U071 COVID-19: Secondary | ICD-10-CM | POA: Diagnosis not present

## 2023-03-22 DIAGNOSIS — R339 Retention of urine, unspecified: Secondary | ICD-10-CM | POA: Diagnosis not present

## 2023-03-22 DIAGNOSIS — N390 Urinary tract infection, site not specified: Secondary | ICD-10-CM | POA: Diagnosis not present

## 2023-03-22 DIAGNOSIS — I129 Hypertensive chronic kidney disease with stage 1 through stage 4 chronic kidney disease, or unspecified chronic kidney disease: Secondary | ICD-10-CM | POA: Diagnosis not present

## 2023-03-22 DIAGNOSIS — N189 Chronic kidney disease, unspecified: Secondary | ICD-10-CM | POA: Diagnosis not present

## 2023-03-22 DIAGNOSIS — J1282 Pneumonia due to coronavirus disease 2019: Secondary | ICD-10-CM | POA: Diagnosis not present

## 2023-03-24 DIAGNOSIS — Z7952 Long term (current) use of systemic steroids: Secondary | ICD-10-CM | POA: Diagnosis not present

## 2023-03-24 DIAGNOSIS — N390 Urinary tract infection, site not specified: Secondary | ICD-10-CM | POA: Diagnosis not present

## 2023-03-24 DIAGNOSIS — E785 Hyperlipidemia, unspecified: Secondary | ICD-10-CM | POA: Diagnosis not present

## 2023-03-24 DIAGNOSIS — H3530 Unspecified macular degeneration: Secondary | ICD-10-CM | POA: Diagnosis not present

## 2023-03-24 DIAGNOSIS — J1282 Pneumonia due to coronavirus disease 2019: Secondary | ICD-10-CM | POA: Diagnosis not present

## 2023-03-24 DIAGNOSIS — I10 Essential (primary) hypertension: Secondary | ICD-10-CM | POA: Diagnosis not present

## 2023-03-24 DIAGNOSIS — D631 Anemia in chronic kidney disease: Secondary | ICD-10-CM | POA: Diagnosis not present

## 2023-03-24 DIAGNOSIS — Z7982 Long term (current) use of aspirin: Secondary | ICD-10-CM | POA: Diagnosis not present

## 2023-03-24 DIAGNOSIS — Z96612 Presence of left artificial shoulder joint: Secondary | ICD-10-CM | POA: Diagnosis not present

## 2023-03-24 DIAGNOSIS — H548 Legal blindness, as defined in USA: Secondary | ICD-10-CM | POA: Diagnosis not present

## 2023-03-24 DIAGNOSIS — Z955 Presence of coronary angioplasty implant and graft: Secondary | ICD-10-CM | POA: Diagnosis not present

## 2023-03-24 DIAGNOSIS — Z87891 Personal history of nicotine dependence: Secondary | ICD-10-CM | POA: Diagnosis not present

## 2023-03-24 DIAGNOSIS — K5909 Other constipation: Secondary | ICD-10-CM | POA: Diagnosis not present

## 2023-03-24 DIAGNOSIS — R339 Retention of urine, unspecified: Secondary | ICD-10-CM | POA: Diagnosis not present

## 2023-03-24 DIAGNOSIS — I129 Hypertensive chronic kidney disease with stage 1 through stage 4 chronic kidney disease, or unspecified chronic kidney disease: Secondary | ICD-10-CM | POA: Diagnosis not present

## 2023-03-24 DIAGNOSIS — I2510 Atherosclerotic heart disease of native coronary artery without angina pectoris: Secondary | ICD-10-CM | POA: Diagnosis not present

## 2023-03-24 DIAGNOSIS — Z556 Problems related to health literacy: Secondary | ICD-10-CM | POA: Diagnosis not present

## 2023-03-24 DIAGNOSIS — Z8546 Personal history of malignant neoplasm of prostate: Secondary | ICD-10-CM | POA: Diagnosis not present

## 2023-03-24 DIAGNOSIS — N189 Chronic kidney disease, unspecified: Secondary | ICD-10-CM | POA: Diagnosis not present

## 2023-03-24 DIAGNOSIS — U071 COVID-19: Secondary | ICD-10-CM | POA: Diagnosis not present

## 2023-03-29 DIAGNOSIS — J1282 Pneumonia due to coronavirus disease 2019: Secondary | ICD-10-CM | POA: Diagnosis not present

## 2023-03-29 DIAGNOSIS — N189 Chronic kidney disease, unspecified: Secondary | ICD-10-CM | POA: Diagnosis not present

## 2023-03-29 DIAGNOSIS — N390 Urinary tract infection, site not specified: Secondary | ICD-10-CM | POA: Diagnosis not present

## 2023-03-29 DIAGNOSIS — R339 Retention of urine, unspecified: Secondary | ICD-10-CM | POA: Diagnosis not present

## 2023-03-29 DIAGNOSIS — I129 Hypertensive chronic kidney disease with stage 1 through stage 4 chronic kidney disease, or unspecified chronic kidney disease: Secondary | ICD-10-CM | POA: Diagnosis not present

## 2023-03-29 DIAGNOSIS — U071 COVID-19: Secondary | ICD-10-CM | POA: Diagnosis not present

## 2023-03-30 DIAGNOSIS — R339 Retention of urine, unspecified: Secondary | ICD-10-CM | POA: Diagnosis not present

## 2023-03-30 DIAGNOSIS — R338 Other retention of urine: Secondary | ICD-10-CM | POA: Diagnosis not present

## 2023-03-30 DIAGNOSIS — U071 COVID-19: Secondary | ICD-10-CM | POA: Diagnosis not present

## 2023-03-30 DIAGNOSIS — J1282 Pneumonia due to coronavirus disease 2019: Secondary | ICD-10-CM | POA: Diagnosis not present

## 2023-03-30 DIAGNOSIS — I129 Hypertensive chronic kidney disease with stage 1 through stage 4 chronic kidney disease, or unspecified chronic kidney disease: Secondary | ICD-10-CM | POA: Diagnosis not present

## 2023-03-30 DIAGNOSIS — N390 Urinary tract infection, site not specified: Secondary | ICD-10-CM | POA: Diagnosis not present

## 2023-03-30 DIAGNOSIS — N189 Chronic kidney disease, unspecified: Secondary | ICD-10-CM | POA: Diagnosis not present

## 2023-03-31 DIAGNOSIS — H353211 Exudative age-related macular degeneration, right eye, with active choroidal neovascularization: Secondary | ICD-10-CM | POA: Diagnosis not present

## 2023-03-31 DIAGNOSIS — H43813 Vitreous degeneration, bilateral: Secondary | ICD-10-CM | POA: Diagnosis not present

## 2023-03-31 DIAGNOSIS — Z961 Presence of intraocular lens: Secondary | ICD-10-CM | POA: Diagnosis not present

## 2023-03-31 DIAGNOSIS — H353221 Exudative age-related macular degeneration, left eye, with active choroidal neovascularization: Secondary | ICD-10-CM | POA: Diagnosis not present

## 2023-04-03 DIAGNOSIS — R339 Retention of urine, unspecified: Secondary | ICD-10-CM | POA: Diagnosis not present

## 2023-04-03 DIAGNOSIS — N189 Chronic kidney disease, unspecified: Secondary | ICD-10-CM | POA: Diagnosis not present

## 2023-04-03 DIAGNOSIS — N390 Urinary tract infection, site not specified: Secondary | ICD-10-CM | POA: Diagnosis not present

## 2023-04-03 DIAGNOSIS — J1282 Pneumonia due to coronavirus disease 2019: Secondary | ICD-10-CM | POA: Diagnosis not present

## 2023-04-03 DIAGNOSIS — I129 Hypertensive chronic kidney disease with stage 1 through stage 4 chronic kidney disease, or unspecified chronic kidney disease: Secondary | ICD-10-CM | POA: Diagnosis not present

## 2023-04-03 DIAGNOSIS — U071 COVID-19: Secondary | ICD-10-CM | POA: Diagnosis not present

## 2023-04-04 ENCOUNTER — Encounter
Payer: Medicare Other | Attending: Physical Medicine and Rehabilitation | Admitting: Physical Medicine and Rehabilitation

## 2023-04-04 VITALS — BP 109/71 | HR 77 | Ht 67.0 in | Wt 132.0 lb

## 2023-04-04 DIAGNOSIS — J1282 Pneumonia due to coronavirus disease 2019: Secondary | ICD-10-CM | POA: Diagnosis not present

## 2023-04-04 DIAGNOSIS — N189 Chronic kidney disease, unspecified: Secondary | ICD-10-CM | POA: Diagnosis not present

## 2023-04-04 DIAGNOSIS — N401 Enlarged prostate with lower urinary tract symptoms: Secondary | ICD-10-CM | POA: Diagnosis not present

## 2023-04-04 DIAGNOSIS — N390 Urinary tract infection, site not specified: Secondary | ICD-10-CM | POA: Diagnosis not present

## 2023-04-04 DIAGNOSIS — K5900 Constipation, unspecified: Secondary | ICD-10-CM | POA: Insufficient documentation

## 2023-04-04 DIAGNOSIS — U071 COVID-19: Secondary | ICD-10-CM | POA: Diagnosis not present

## 2023-04-04 DIAGNOSIS — R339 Retention of urine, unspecified: Secondary | ICD-10-CM | POA: Diagnosis not present

## 2023-04-04 DIAGNOSIS — R42 Dizziness and giddiness: Secondary | ICD-10-CM

## 2023-04-04 DIAGNOSIS — R5381 Other malaise: Secondary | ICD-10-CM | POA: Insufficient documentation

## 2023-04-04 DIAGNOSIS — R351 Nocturia: Secondary | ICD-10-CM | POA: Diagnosis not present

## 2023-04-04 DIAGNOSIS — I129 Hypertensive chronic kidney disease with stage 1 through stage 4 chronic kidney disease, or unspecified chronic kidney disease: Secondary | ICD-10-CM | POA: Diagnosis not present

## 2023-04-04 MED ORDER — AMLODIPINE BESYLATE 5 MG PO TABS: 5.0000 mg | ORAL_TABLET | Freq: Every day | ORAL | 3 refills | Status: DC

## 2023-04-04 MED ORDER — TAMSULOSIN HCL 0.4 MG PO CAPS: 0.4000 mg | ORAL_CAPSULE | Freq: Every day | ORAL | 3 refills | Status: DC

## 2023-04-04 NOTE — Progress Notes (Signed)
Subjective:    Patient ID: Donald Todd, male    DOB: 1929/07/29, 89 y.o.   MRN: [id number]  HPI Pain Inventory Average Pain 0 Pain Right Now 0 My pain is  no pai  In the last 24 hours, has pain interfered with the following? General activity 0 Relation with others 0 Enjoyment of life 0 What TIME of day is your pain at its worst? varies Sleep (in general) Good  Pain is worse with:  no pain Pain improves with:  no pain Relief from Meds:  no pain  use a walker how many minutes can you walk? unlimited ability to climb steps?  yes do you drive?  no  retired I need assistance with the following:  dressing  weakness numbness trouble walking dizziness  Hospital f/u  Hospital f/u    Family History  Problem Relation Age of Onset   Cancer Mother        stomach?   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  Tobacco Use   Smoking status: Former    Packs/day: 0.25    Years: 13.00    Additional pack years: 0.00    Total pack years: 3.25    Types: Cigarettes, Cigars    Quit date: 12/14/1963    Years since quitting: 59.3   Smokeless tobacco: Never  Vaping Use   Vaping Use: Never used  Substance and Sexual Activity   Alcohol use: No   Drug use: No   Sexual activity: Not Currently  Other Topics Concern   Not on file  Social History Narrative   Lives at home w/ his wife   Right-handed   Caffeine: 2 cups of coffee per day, rare soft drink   Social Determinants of Health   Financial Resource Strain: Not on file  Food Insecurity: No Food Insecurity (02/03/2023)   Hunger Vital Sign    Worried About Running Out of Food in the Last Year: Never true    Ran Out of Food in the Last  Year: Never true  Transportation Needs: No Transportation Needs (02/03/2023)   PRAPARE - Administrator, Civil Service (Medical): No    Lack of Transportation (Non-Medical): No  Physical Activity: Not on file  Stress: Not on file  Social Connections: Not on file   Past Surgical History:  Procedure Laterality Date   ANGIOPLASTY  1993   athroscopic knee surgery  2002   right   bone scan  2005   CARDIAC CATHETERIZATION  07-09-2010   normal left ventricular function, coronary obsstructive disease with 20% proximal an 30-40% mid lt. anterior descending narrowing ; widely patent stent in the proximal ramus intermediate vessel; 50-70-% narrowing in the mid circumflex,and widely patent stent  in the rt. coronary    CARDIAC CATHETERIZATION  12-22-2003   widely patent stents with noncritical coronary artery disease and normalLV function   CHOLECYSTECTOMY  05/05/2009   Laparoscopic   colonscopy  2004   with biopsy   CORONARY ANGIOPLASTY WITH STENT PLACEMENT  03-15-1996   pci to rci and ramus branch using j&j [id number] stent,post dialated  at  20atm (rca)  and 14 atm (ramus branch. ef 55%             HERNIA REPAIR  08/01/11   RIH   INGUINAL HERNIA REPAIR Left 03/29/2013   Procedure: HERNIA REPAIR INGUINAL ADULT;  Surgeon: Mariella Saa, MD;  Location: [facility] ORS;  Service: General;  Laterality: Left;   INSERTION OF MESH Left 03/29/2013   Procedure: INSERTION OF MESH;  Surgeon: Mariella Saa, MD;  Location: [facility];  Service: General;  Laterality: Left;   JOINT REPLACEMENT  2006 and 2010   left 2006, right 2010   KIDNEY SURGERY  2002 and 2005   2002 - partial removal of left. 2005 complete removal   PROSTATE BIOPSY     prostate seed implant     radiation treatment  2002   prostate   REVERSE SHOULDER ARTHROPLASTY Left 05/26/2017   Procedure: REVERSE LEFT TOTAL SHOULDER ARTHROPLASTY;  Surgeon: Beverely Low, MD;  Location: [facility] OR;  Service: Orthopedics;  Laterality: Left;   stent implants   1997   Past Medical History:  Diagnosis Date   Arthritis    CAD (coronary artery disease)    last cath. 07-09-2010   Cancer [facility])    kidney cancer , prostate cancer    Chronic kidney disease    lt kidnet removed   H/O cardiovascular stress test 08-04-2010   ef 55% NO ISCHEMIA   H/O unilateral nephrectomy    lt. nephrectomy   Heart attack (HCC) 1993   History of kidney stones    Hyperlipemia    Hypertension    renal dopplers 218.13-normal patency   Inguinal hernia 08/01/11   right   Macular degeneration    Prostate cancer [facility])    Prostate disease    Cancer S/P seed implant   Renal mass    Vertigo    BP 109/71   Pulse 77   Ht 5\' 7"  (1.702 m)   Wt 132 lb (59.9 kg)   SpO2 94%   BMI 20.67 kg/m   Opioid Risk Score:   Fall Risk Score:  `1  Depression screen [facility] 2/9     04/04/2023   12:16 PM  Depression screen PHQ 2/9  Decreased Interest 0  Down, Depressed, Hopeless 0  PHQ - 2 Score 0  Altered sleeping 0  Tired, decreased energy 0  Change in appetite 3  Feeling bad or failure about yourself  0  Trouble concentrating 0  Moving slowly or fidgety/restless 0  Suicidal thoughts 0  PHQ-9 Score 3  Difficult doing work/chores Not difficult at all    1) Dizzy -sometimes at home -takes 10mg  amlodipine daily at home    Review of Systems  Neurological:  Positive for dizziness, weakness and numbness.  All other systems reviewed and are negative.     Objective:   Physical Exam Gen: no distress, normal appearing HEENT: oral mucosa pink and moist, NCAT Cardio: Reg rate Chest: normal effort, normal rate of breathing Abd: soft, non-distended Ext: no edema Psych: pleasant, normal affect Skin: intact Neuro: Alert and oriented x3    Assessment & Plan:   1) Dizziness: -decrease amlodipine 5mg  daily   2) BPH: -discussed the benefits of flomax, increase to 0.4-0.8mg  HS  3) Constipation:  -Provided list of following foods that help with constipation and  highlighted a few: 1) prunes- contain high amounts of fiber.  2) apples- has a form of dietary fiber called pectin that accelerates stool movement and increases beneficial gut bacteria 3) pears- in  addition to fiber, also high in fructose and sorbitol which have laxative effect 4) figs- contain an enzyme ficin which helps to speed colonic transit 5) kiwis- contain an enzyme actinidin that improves gut motility and reduces constipation 6) oranges- rich in pectin (like apples) 7) grapefruits- contain a flavanol naringenin which has a laxative effect 8) vegetables- rich in fiber and also great sources of folate, vitamin C, and K 9) artichoke- high in inulin, prebiotic great for the microbiome 10) chicory- increases stool frequency and softness (can be added to coffee) 11) rhubarb- laxative effect 12) sweet potato- high fiber 13) beans, peas, and lentils- contain both soluble and insoluble fiber 14) chia seeds- improves intestinal health and gut flora 15) flaxseeds- laxative effect 16) whole grain rye bread- high in fiber 17) oat bran- high in soluble and insoluble fiber 18) kefir- softens stools -recommended to try at least one of these foods every day.  -drink 6-8 glasses of water per day -walk regularly, especially after meals.     4) Debility: -continue handicap placard

## 2023-04-04 NOTE — Patient Instructions (Signed)
Constipation:  -Provided list of following foods that help with constipation and highlighted a few: 1) prunes- contain high amounts of fiber.  2) apples- has a form of dietary fiber called pectin that accelerates stool movement and increases beneficial gut bacteria 3) pears- in addition to fiber, also high in fructose and sorbitol which have laxative effect 4) figs- contain an enzyme ficin which helps to speed colonic transit 5) kiwis- contain an enzyme actinidin that improves gut motility and reduces constipation 6) oranges- rich in pectin (like apples) 7) grapefruits- contain a flavanol naringenin which has a laxative effect 8) vegetables- rich in fiber and also great sources of folate, vitamin C, and K 9) artichoke- high in inulin, prebiotic great for the microbiome 10) chicory- increases stool frequency and softness (can be added to coffee) 11) rhubarb- laxative effect 12) sweet potato- high fiber 13) beans, peas, and lentils- contain both soluble and insoluble fiber 14) chia seeds- improves intestinal health and gut flora 15) flaxseeds- laxative effect 16) whole grain rye bread- high in fiber 17) oat bran- high in soluble and insoluble fiber 18) kefir- softens stools -recommended to try at least one of these foods every day.  -drink 6-8 glasses of water per day -walk regularly, especially after meals.      

## 2023-04-05 DIAGNOSIS — J1282 Pneumonia due to coronavirus disease 2019: Secondary | ICD-10-CM | POA: Diagnosis not present

## 2023-04-05 DIAGNOSIS — N189 Chronic kidney disease, unspecified: Secondary | ICD-10-CM | POA: Diagnosis not present

## 2023-04-05 DIAGNOSIS — U071 COVID-19: Secondary | ICD-10-CM | POA: Diagnosis not present

## 2023-04-05 DIAGNOSIS — R339 Retention of urine, unspecified: Secondary | ICD-10-CM | POA: Diagnosis not present

## 2023-04-05 DIAGNOSIS — N390 Urinary tract infection, site not specified: Secondary | ICD-10-CM | POA: Diagnosis not present

## 2023-04-05 DIAGNOSIS — I129 Hypertensive chronic kidney disease with stage 1 through stage 4 chronic kidney disease, or unspecified chronic kidney disease: Secondary | ICD-10-CM | POA: Diagnosis not present

## 2023-04-07 DIAGNOSIS — M150 Primary generalized (osteo)arthritis: Secondary | ICD-10-CM | POA: Diagnosis not present

## 2023-04-07 DIAGNOSIS — N183 Chronic kidney disease, stage 3 unspecified: Secondary | ICD-10-CM | POA: Diagnosis not present

## 2023-04-07 DIAGNOSIS — E7800 Pure hypercholesterolemia, unspecified: Secondary | ICD-10-CM | POA: Diagnosis not present

## 2023-04-07 DIAGNOSIS — I10 Essential (primary) hypertension: Secondary | ICD-10-CM | POA: Diagnosis not present

## 2023-04-10 DIAGNOSIS — I129 Hypertensive chronic kidney disease with stage 1 through stage 4 chronic kidney disease, or unspecified chronic kidney disease: Secondary | ICD-10-CM | POA: Diagnosis not present

## 2023-04-10 DIAGNOSIS — U071 COVID-19: Secondary | ICD-10-CM | POA: Diagnosis not present

## 2023-04-10 DIAGNOSIS — J1282 Pneumonia due to coronavirus disease 2019: Secondary | ICD-10-CM | POA: Diagnosis not present

## 2023-04-10 DIAGNOSIS — R339 Retention of urine, unspecified: Secondary | ICD-10-CM | POA: Diagnosis not present

## 2023-04-10 DIAGNOSIS — N189 Chronic kidney disease, unspecified: Secondary | ICD-10-CM | POA: Diagnosis not present

## 2023-04-10 DIAGNOSIS — N390 Urinary tract infection, site not specified: Secondary | ICD-10-CM | POA: Diagnosis not present

## 2023-04-12 DIAGNOSIS — N390 Urinary tract infection, site not specified: Secondary | ICD-10-CM | POA: Diagnosis not present

## 2023-04-12 DIAGNOSIS — U071 COVID-19: Secondary | ICD-10-CM | POA: Diagnosis not present

## 2023-04-12 DIAGNOSIS — N189 Chronic kidney disease, unspecified: Secondary | ICD-10-CM | POA: Diagnosis not present

## 2023-04-12 DIAGNOSIS — R339 Retention of urine, unspecified: Secondary | ICD-10-CM | POA: Diagnosis not present

## 2023-04-12 DIAGNOSIS — J1282 Pneumonia due to coronavirus disease 2019: Secondary | ICD-10-CM | POA: Diagnosis not present

## 2023-04-12 DIAGNOSIS — I129 Hypertensive chronic kidney disease with stage 1 through stage 4 chronic kidney disease, or unspecified chronic kidney disease: Secondary | ICD-10-CM | POA: Diagnosis not present

## 2023-04-13 DIAGNOSIS — I129 Hypertensive chronic kidney disease with stage 1 through stage 4 chronic kidney disease, or unspecified chronic kidney disease: Secondary | ICD-10-CM | POA: Diagnosis not present

## 2023-04-13 DIAGNOSIS — U071 COVID-19: Secondary | ICD-10-CM | POA: Diagnosis not present

## 2023-04-13 DIAGNOSIS — N189 Chronic kidney disease, unspecified: Secondary | ICD-10-CM | POA: Diagnosis not present

## 2023-04-13 DIAGNOSIS — N390 Urinary tract infection, site not specified: Secondary | ICD-10-CM | POA: Diagnosis not present

## 2023-04-13 DIAGNOSIS — J1282 Pneumonia due to coronavirus disease 2019: Secondary | ICD-10-CM | POA: Diagnosis not present

## 2023-04-13 DIAGNOSIS — R339 Retention of urine, unspecified: Secondary | ICD-10-CM | POA: Diagnosis not present

## 2023-04-20 DIAGNOSIS — I129 Hypertensive chronic kidney disease with stage 1 through stage 4 chronic kidney disease, or unspecified chronic kidney disease: Secondary | ICD-10-CM | POA: Diagnosis not present

## 2023-04-20 DIAGNOSIS — N390 Urinary tract infection, site not specified: Secondary | ICD-10-CM | POA: Diagnosis not present

## 2023-04-20 DIAGNOSIS — R339 Retention of urine, unspecified: Secondary | ICD-10-CM | POA: Diagnosis not present

## 2023-04-20 DIAGNOSIS — N189 Chronic kidney disease, unspecified: Secondary | ICD-10-CM | POA: Diagnosis not present

## 2023-04-20 DIAGNOSIS — J1282 Pneumonia due to coronavirus disease 2019: Secondary | ICD-10-CM | POA: Diagnosis not present

## 2023-04-20 DIAGNOSIS — U071 COVID-19: Secondary | ICD-10-CM | POA: Diagnosis not present

## 2023-04-21 DIAGNOSIS — I129 Hypertensive chronic kidney disease with stage 1 through stage 4 chronic kidney disease, or unspecified chronic kidney disease: Secondary | ICD-10-CM | POA: Diagnosis not present

## 2023-04-21 DIAGNOSIS — N189 Chronic kidney disease, unspecified: Secondary | ICD-10-CM | POA: Diagnosis not present

## 2023-04-21 DIAGNOSIS — N390 Urinary tract infection, site not specified: Secondary | ICD-10-CM | POA: Diagnosis not present

## 2023-04-21 DIAGNOSIS — U071 COVID-19: Secondary | ICD-10-CM | POA: Diagnosis not present

## 2023-04-21 DIAGNOSIS — R339 Retention of urine, unspecified: Secondary | ICD-10-CM | POA: Diagnosis not present

## 2023-04-21 DIAGNOSIS — J1282 Pneumonia due to coronavirus disease 2019: Secondary | ICD-10-CM | POA: Diagnosis not present

## 2023-05-02 DIAGNOSIS — Z6822 Body mass index (BMI) 22.0-22.9, adult: Secondary | ICD-10-CM | POA: Diagnosis not present

## 2023-05-02 DIAGNOSIS — Z111 Encounter for screening for respiratory tuberculosis: Secondary | ICD-10-CM | POA: Diagnosis not present

## 2023-05-02 DIAGNOSIS — I10 Essential (primary) hypertension: Secondary | ICD-10-CM | POA: Diagnosis not present

## 2023-05-02 DIAGNOSIS — R443 Hallucinations, unspecified: Secondary | ICD-10-CM | POA: Diagnosis not present

## 2023-05-02 DIAGNOSIS — R7303 Prediabetes: Secondary | ICD-10-CM | POA: Diagnosis not present

## 2023-05-04 ENCOUNTER — Inpatient Hospital Stay (HOSPITAL_BASED_OUTPATIENT_CLINIC_OR_DEPARTMENT_OTHER): Payer: Medicare Other | Admitting: Hematology & Oncology

## 2023-05-04 ENCOUNTER — Encounter: Payer: Self-pay | Admitting: Hematology & Oncology

## 2023-05-04 ENCOUNTER — Inpatient Hospital Stay: Payer: Medicare Other | Attending: Hematology & Oncology

## 2023-05-04 VITALS — BP 121/68 | HR 68 | Temp 97.7°F | Resp 18 | Wt 132.0 lb

## 2023-05-04 DIAGNOSIS — Z905 Acquired absence of kidney: Secondary | ICD-10-CM | POA: Diagnosis not present

## 2023-05-04 DIAGNOSIS — C642 Malignant neoplasm of left kidney, except renal pelvis: Secondary | ICD-10-CM | POA: Diagnosis not present

## 2023-05-04 DIAGNOSIS — C7801 Secondary malignant neoplasm of right lung: Secondary | ICD-10-CM | POA: Diagnosis not present

## 2023-05-04 DIAGNOSIS — C649 Malignant neoplasm of unspecified kidney, except renal pelvis: Secondary | ICD-10-CM

## 2023-05-04 DIAGNOSIS — Z923 Personal history of irradiation: Secondary | ICD-10-CM | POA: Diagnosis not present

## 2023-05-04 DIAGNOSIS — C7989 Secondary malignant neoplasm of other specified sites: Secondary | ICD-10-CM | POA: Diagnosis not present

## 2023-05-04 LAB — CBC WITH DIFFERENTIAL (CANCER CENTER ONLY)
Abs Immature Granulocytes: 0.03 10*3/uL (ref 0.00–0.07)
Basophils Absolute: 0.1 10*3/uL (ref 0.0–0.1)
Basophils Relative: 1 %
Eosinophils Absolute: 0.4 10*3/uL (ref 0.0–0.5)
Eosinophils Relative: 6 %
HCT: 37.8 % — ABNORMAL LOW (ref 39.0–52.0)
Hemoglobin: 11.9 g/dL — ABNORMAL LOW (ref 13.0–17.0)
Immature Granulocytes: 0 %
Lymphocytes Relative: 28 %
Lymphs Abs: 1.9 10*3/uL (ref 0.7–4.0)
MCH: 29.7 pg (ref 26.0–34.0)
MCHC: 31.5 g/dL (ref 30.0–36.0)
MCV: 94.3 fL (ref 80.0–100.0)
Monocytes Absolute: 0.5 10*3/uL (ref 0.1–1.0)
Monocytes Relative: 8 %
Neutro Abs: 3.9 10*3/uL (ref 1.7–7.7)
Neutrophils Relative %: 57 %
Platelet Count: 182 10*3/uL (ref 150–400)
RBC: 4.01 MIL/uL — ABNORMAL LOW (ref 4.22–5.81)
RDW: 14 % (ref 11.5–15.5)
WBC Count: 6.8 10*3/uL (ref 4.0–10.5)
nRBC: 0 % (ref 0.0–0.2)

## 2023-05-04 LAB — LACTATE DEHYDROGENASE: LDH: 161 U/L (ref 98–192)

## 2023-05-04 LAB — CMP (CANCER CENTER ONLY)
ALT: 24 U/L (ref 0–44)
AST: 22 U/L (ref 15–41)
Albumin: 4 g/dL (ref 3.5–5.0)
Alkaline Phosphatase: 117 U/L (ref 38–126)
Anion gap: 5 (ref 5–15)
BUN: 22 mg/dL (ref 8–23)
CO2: 29 mmol/L (ref 22–32)
Calcium: 9.4 mg/dL (ref 8.9–10.3)
Chloride: 105 mmol/L (ref 98–111)
Creatinine: 1.51 mg/dL — ABNORMAL HIGH (ref 0.61–1.24)
GFR, Estimated: 43 mL/min — ABNORMAL LOW (ref >60)
Glucose, Bld: 121 mg/dL — ABNORMAL HIGH (ref 70–99)
Potassium: 4.7 mmol/L (ref 3.5–5.1)
Sodium: 139 mmol/L (ref 135–145)
Total Bilirubin: 0.4 mg/dL (ref 0.3–1.2)
Total Protein: 6.9 g/dL (ref 6.5–8.1)

## 2023-05-04 NOTE — Progress Notes (Signed)
Hematology and Oncology Follow Up Visit  Donald Todd [id number] 09-Sep-1929 89 y.o. 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  He presented with localized disease in 2002.   Prior Therapy:  He is status post partial nephrectomy completed on May 03, 2001.  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  He remained disease-free until recently.    On February 11, 2021 he presented with abdominal distention and underwent CT scan of the abdomen and pelvis which showed a 4.8 x 2.8 cm mass in the upper left psoas muscle.    He is status post radiation therapy to the left psoas mass completed on Apr 23, 2021.  He received 50 Gray in 5 fractions.  Current therapy: Active surveillance.  Interim History: Donald Todd returns today for a follow-up.  We last saw him back in March.  Since then, he has been doing okay.  Unfortunate, his wife was recently in the hospital.  Sound like she developed some hepatic encephalopathy from [place].  She has home right now.  He gets around with a rolling walker.  Thankfully, he has not fallen.  He has had no problems with pain.  He has had no problems with nausea or vomiting.  He is eating okay.  He is urinating well.  There is no bowel problems.  He has had no leg swelling.  There is been no rashes.  He has had no fever.  He has had no headache.  Overall, I would have said that his performance status is probably ECOG 2.    Medications: Updated on review. Current Outpatient Medications  Medication Sig Dispense Refill   acetaminophen (TYLENOL) 325 MG tablet Take 1-2 tablets (325-650 mg total)  by mouth every 4 (four) hours as needed for mild pain.     amLODipine (NORVASC) 5 MG tablet Take 1 tablet (5 mg total) by mouth daily. 90 tablet 3   aspirin 81 MG tablet Take 81 mg by mouth at bedtime.     atorvastatin (LIPITOR) 40 MG tablet TAKE 1 TABLET AT BEDTIME 90 tablet 3   calcium carbonate (TUMS - DOSED IN MG ELEMENTAL CALCIUM) 500 MG chewable tablet Chew 1,000 mg by mouth daily as needed for indigestion or heartburn.     docusate sodium (COLACE) 100 MG capsule Take 1 capsule (100 mg total) by mouth every 12 (twelve) hours. (Patient taking differently: Take 200 mg by mouth daily.) 60 capsule 0   ipratropium (ATROVENT) 0.03 % nasal spray Place 1-2 sprays into both nostrils 2 (two) times daily as needed (allergies).     loratadine (CLARITIN) 10 MG tablet Take 10 mg by mouth daily.     meclizine (ANTIVERT) 25 MG tablet Take 25 mg by mouth as needed for dizziness.     Multiple Vitamins-Minerals (PRESERVISION AREDS 2) CAPS Take 1 capsule by mouth 2 (two) times daily.     nitroGLYCERIN (NITROSTAT) 0.4 MG SL tablet Place 1 tablet (0.4 mg total) under the tongue as needed. 25 tablet 3   polyethylene glycol (MIRALAX / GLYCOLAX) 17 g packet Take 17 g by mouth daily. 14 each 2   QUEtiapine (SEROQUEL) 25  MG tablet Take 1 tablet (25 mg total) by mouth at bedtime. 30 tablet 0   tamsulosin (FLOMAX) 0.4 MG CAPS capsule Take 1-2 capsules (0.4-0.8 mg total) by mouth daily after supper. 180 capsule 3   No current facility-administered medications for this visit.     Allergies:  Allergies  Allergen Reactions   Tape Hives    Surgical tape - causes blisters   Hydrocodone     Shaking uncontrollably    Iohexol      Desc: PT DOES RECALL REACTION JUST SAYS IT WAS A LONG TIME AGO FOR KIDNEY X-RAYS AND HE DIDN'T TOLERATE IT WELL-ARS 05/02/09-NOTE E-CHART STATES A HOT FEELING IS HIS REACTION, BUT HE CAN'T CONFIRM THAT WAS ALL    Oxycodone Other (See Comments)    Shaking uncontrollably    Review of Systems   Constitutional: Negative.   HENT: Negative.    Eyes: Negative.   Respiratory: Negative.    Cardiovascular: Negative.   Gastrointestinal: Negative.   Genitourinary: Negative.   Musculoskeletal: Negative.   Skin: Negative.   Neurological: Negative.   Endo/Heme/Allergies: Negative.   Psychiatric/Behavioral: Negative.        Physical Exam:  Blood pressure 121/68, pulse 68, temperature 97.7 F (36.5 C), temperature source Oral, resp. rate 18, weight 132 lb (59.9 kg), SpO2 97 %.  Physical Exam Vitals reviewed.  HENT:     Head: Normocephalic and atraumatic.  Eyes:     Pupils: Pupils are equal, Todd, and reactive to light.  Cardiovascular:     Rate and Rhythm: Normal rate and regular rhythm.     Heart sounds: Normal heart sounds.  Pulmonary:     Effort: Pulmonary effort is normal.     Breath sounds: Normal breath sounds.  Abdominal:     General: Bowel sounds are normal.     Palpations: Abdomen is soft.  Musculoskeletal:        General: No tenderness or deformity. Normal range of motion.     Cervical back: Normal range of motion.  Lymphadenopathy:     Cervical: No cervical adenopathy.  Skin:    General: Skin is warm and dry.     Findings: No erythema or rash.  Neurological:     Mental Status: He is alert and oriented to person, place, and time.  Psychiatric:        Behavior: Behavior normal.        Thought Content: Thought content normal.        Judgment: Judgment normal.     Lab Results: Lab Results  Component Value Date   WBC 6.8 05/04/2023   HGB 11.9 (L) 05/04/2023   HCT 37.8 (L) 05/04/2023   MCV 94.3 05/04/2023   PLT 182 05/04/2023     Chemistry      Component Value Date/Time   NA 139 05/04/2023 1507   K 4.7 05/04/2023 1507   CL 105 05/04/2023 1507   CO2 29 05/04/2023 1507   BUN 22 05/04/2023 1507   CREATININE 1.51 (H) 05/04/2023 1507      Component Value Date/Time   CALCIUM 9.4 05/04/2023 1507   ALKPHOS 117 05/04/2023 1507   AST 22 05/04/2023  1507   ALT 24 05/04/2023 1507   BILITOT 0.4 05/04/2023 1507     Study Result     Impression and Plan:  Donald Todd is a very nice 89 year old white male.  He has a past history of clear-cell carcinoma of the kidney.  He has had a past left nephrectomy.  This was back in the 2005.  He has had radiation therapy for recurrence.  He is totally asymptomatic.  His last CT scan was done back in August 2023.  Everything looked pretty stable on that scan.  Hopefully, his poor wife will get a little bit better.  We will try to get him through most of Summer now.  I would like to get another CT scan on him before I see him back.  I think this would be reasonable.  Last scan was about a year ago.  I realize that he is somewhat elderly and frail.  Even if he has recurrent disease, I am not sure how we be able to treat this.  However, I think it is still worthwhile monitoring him for metastasis.    Josph Macho, MD 5/30/20243:50 PM

## 2023-05-05 DIAGNOSIS — H353211 Exudative age-related macular degeneration, right eye, with active choroidal neovascularization: Secondary | ICD-10-CM | POA: Diagnosis not present

## 2023-05-11 DIAGNOSIS — M159 Polyosteoarthritis, unspecified: Secondary | ICD-10-CM | POA: Diagnosis not present

## 2023-05-11 DIAGNOSIS — M62552 Muscle wasting and atrophy, not elsewhere classified, left thigh: Secondary | ICD-10-CM | POA: Diagnosis not present

## 2023-05-11 DIAGNOSIS — M62562 Muscle wasting and atrophy, not elsewhere classified, left lower leg: Secondary | ICD-10-CM | POA: Diagnosis not present

## 2023-05-11 DIAGNOSIS — M62512 Muscle wasting and atrophy, not elsewhere classified, left shoulder: Secondary | ICD-10-CM | POA: Diagnosis not present

## 2023-05-11 DIAGNOSIS — R488 Other symbolic dysfunctions: Secondary | ICD-10-CM | POA: Diagnosis not present

## 2023-05-11 DIAGNOSIS — M62561 Muscle wasting and atrophy, not elsewhere classified, right lower leg: Secondary | ICD-10-CM | POA: Diagnosis not present

## 2023-05-11 DIAGNOSIS — R4789 Other speech disturbances: Secondary | ICD-10-CM | POA: Diagnosis not present

## 2023-05-11 DIAGNOSIS — M62521 Muscle wasting and atrophy, not elsewhere classified, right upper arm: Secondary | ICD-10-CM | POA: Diagnosis not present

## 2023-05-11 DIAGNOSIS — R2681 Unsteadiness on feet: Secondary | ICD-10-CM | POA: Diagnosis not present

## 2023-05-11 DIAGNOSIS — M62551 Muscle wasting and atrophy, not elsewhere classified, right thigh: Secondary | ICD-10-CM | POA: Diagnosis not present

## 2023-05-11 DIAGNOSIS — M62522 Muscle wasting and atrophy, not elsewhere classified, left upper arm: Secondary | ICD-10-CM | POA: Diagnosis not present

## 2023-05-12 ENCOUNTER — Ambulatory Visit (HOSPITAL_BASED_OUTPATIENT_CLINIC_OR_DEPARTMENT_OTHER)
Admission: RE | Admit: 2023-05-12 | Discharge: 2023-05-12 | Disposition: A | Discharge status: Home / Self Care | Payer: Medicare Other | Source: Ambulatory Visit | Attending: Hematology & Oncology | Admitting: Hematology & Oncology

## 2023-05-12 DIAGNOSIS — C801 Malignant (primary) neoplasm, unspecified: Secondary | ICD-10-CM | POA: Diagnosis not present

## 2023-05-12 DIAGNOSIS — R2681 Unsteadiness on feet: Secondary | ICD-10-CM | POA: Diagnosis not present

## 2023-05-12 DIAGNOSIS — M62551 Muscle wasting and atrophy, not elsewhere classified, right thigh: Secondary | ICD-10-CM | POA: Diagnosis not present

## 2023-05-12 DIAGNOSIS — M62552 Muscle wasting and atrophy, not elsewhere classified, left thigh: Secondary | ICD-10-CM | POA: Diagnosis not present

## 2023-05-12 DIAGNOSIS — R488 Other symbolic dysfunctions: Secondary | ICD-10-CM | POA: Diagnosis not present

## 2023-05-12 DIAGNOSIS — R4789 Other speech disturbances: Secondary | ICD-10-CM | POA: Diagnosis not present

## 2023-05-12 DIAGNOSIS — C7801 Secondary malignant neoplasm of right lung: Secondary | ICD-10-CM | POA: Diagnosis not present

## 2023-05-12 DIAGNOSIS — M62512 Muscle wasting and atrophy, not elsewhere classified, left shoulder: Secondary | ICD-10-CM | POA: Diagnosis not present

## 2023-05-12 DIAGNOSIS — C7900 Secondary malignant neoplasm of unspecified kidney and renal pelvis: Secondary | ICD-10-CM | POA: Diagnosis not present

## 2023-05-14 DIAGNOSIS — M62512 Muscle wasting and atrophy, not elsewhere classified, left shoulder: Secondary | ICD-10-CM | POA: Diagnosis not present

## 2023-05-14 DIAGNOSIS — M62551 Muscle wasting and atrophy, not elsewhere classified, right thigh: Secondary | ICD-10-CM | POA: Diagnosis not present

## 2023-05-14 DIAGNOSIS — M62552 Muscle wasting and atrophy, not elsewhere classified, left thigh: Secondary | ICD-10-CM | POA: Diagnosis not present

## 2023-05-14 DIAGNOSIS — R2681 Unsteadiness on feet: Secondary | ICD-10-CM | POA: Diagnosis not present

## 2023-05-14 DIAGNOSIS — R488 Other symbolic dysfunctions: Secondary | ICD-10-CM | POA: Diagnosis not present

## 2023-05-14 DIAGNOSIS — R4789 Other speech disturbances: Secondary | ICD-10-CM | POA: Diagnosis not present

## 2023-05-15 DIAGNOSIS — R2681 Unsteadiness on feet: Secondary | ICD-10-CM | POA: Diagnosis not present

## 2023-05-15 DIAGNOSIS — M62512 Muscle wasting and atrophy, not elsewhere classified, left shoulder: Secondary | ICD-10-CM | POA: Diagnosis not present

## 2023-05-15 DIAGNOSIS — R488 Other symbolic dysfunctions: Secondary | ICD-10-CM | POA: Diagnosis not present

## 2023-05-15 DIAGNOSIS — R4789 Other speech disturbances: Secondary | ICD-10-CM | POA: Diagnosis not present

## 2023-05-15 DIAGNOSIS — M62552 Muscle wasting and atrophy, not elsewhere classified, left thigh: Secondary | ICD-10-CM | POA: Diagnosis not present

## 2023-05-15 DIAGNOSIS — M62551 Muscle wasting and atrophy, not elsewhere classified, right thigh: Secondary | ICD-10-CM | POA: Diagnosis not present

## 2023-05-16 ENCOUNTER — Telehealth: Payer: Self-pay | Admitting: *Deleted

## 2023-05-16 DIAGNOSIS — M62551 Muscle wasting and atrophy, not elsewhere classified, right thigh: Secondary | ICD-10-CM | POA: Diagnosis not present

## 2023-05-16 DIAGNOSIS — M62552 Muscle wasting and atrophy, not elsewhere classified, left thigh: Secondary | ICD-10-CM | POA: Diagnosis not present

## 2023-05-16 DIAGNOSIS — R4789 Other speech disturbances: Secondary | ICD-10-CM | POA: Diagnosis not present

## 2023-05-16 DIAGNOSIS — M62512 Muscle wasting and atrophy, not elsewhere classified, left shoulder: Secondary | ICD-10-CM | POA: Diagnosis not present

## 2023-05-16 DIAGNOSIS — R2681 Unsteadiness on feet: Secondary | ICD-10-CM | POA: Diagnosis not present

## 2023-05-16 DIAGNOSIS — R488 Other symbolic dysfunctions: Secondary | ICD-10-CM | POA: Diagnosis not present

## 2023-05-16 NOTE — Telephone Encounter (Signed)
-----   Message from Josph Macho, MD sent at 05/15/2023  4:58 PM EDT ----- Please call him and let him know that the CT scan looks very stable.  Nothing appears to be growing.  There is nothing new on the scan.  Thanks.  Cindee Lame

## 2023-05-16 NOTE — Telephone Encounter (Signed)
Unable to reach pt, lmovm regarding scan results.

## 2023-05-17 DIAGNOSIS — M62551 Muscle wasting and atrophy, not elsewhere classified, right thigh: Secondary | ICD-10-CM | POA: Diagnosis not present

## 2023-05-17 DIAGNOSIS — R488 Other symbolic dysfunctions: Secondary | ICD-10-CM | POA: Diagnosis not present

## 2023-05-17 DIAGNOSIS — M62512 Muscle wasting and atrophy, not elsewhere classified, left shoulder: Secondary | ICD-10-CM | POA: Diagnosis not present

## 2023-05-17 DIAGNOSIS — M62552 Muscle wasting and atrophy, not elsewhere classified, left thigh: Secondary | ICD-10-CM | POA: Diagnosis not present

## 2023-05-17 DIAGNOSIS — R4789 Other speech disturbances: Secondary | ICD-10-CM | POA: Diagnosis not present

## 2023-05-17 DIAGNOSIS — R2681 Unsteadiness on feet: Secondary | ICD-10-CM | POA: Diagnosis not present

## 2023-05-18 DIAGNOSIS — R7303 Prediabetes: Secondary | ICD-10-CM | POA: Diagnosis not present

## 2023-05-18 DIAGNOSIS — N401 Enlarged prostate with lower urinary tract symptoms: Secondary | ICD-10-CM | POA: Diagnosis not present

## 2023-05-18 DIAGNOSIS — M159 Polyosteoarthritis, unspecified: Secondary | ICD-10-CM | POA: Diagnosis not present

## 2023-05-18 DIAGNOSIS — R42 Dizziness and giddiness: Secondary | ICD-10-CM | POA: Diagnosis not present

## 2023-05-18 DIAGNOSIS — I10 Essential (primary) hypertension: Secondary | ICD-10-CM | POA: Diagnosis not present

## 2023-05-18 DIAGNOSIS — E785 Hyperlipidemia, unspecified: Secondary | ICD-10-CM | POA: Diagnosis not present

## 2023-05-18 DIAGNOSIS — J309 Allergic rhinitis, unspecified: Secondary | ICD-10-CM | POA: Diagnosis not present

## 2023-05-18 DIAGNOSIS — K5900 Constipation, unspecified: Secondary | ICD-10-CM | POA: Diagnosis not present

## 2023-05-18 DIAGNOSIS — H353130 Nonexudative age-related macular degeneration, bilateral, stage unspecified: Secondary | ICD-10-CM | POA: Diagnosis not present

## 2023-05-19 DIAGNOSIS — R4789 Other speech disturbances: Secondary | ICD-10-CM | POA: Diagnosis not present

## 2023-05-19 DIAGNOSIS — M62552 Muscle wasting and atrophy, not elsewhere classified, left thigh: Secondary | ICD-10-CM | POA: Diagnosis not present

## 2023-05-19 DIAGNOSIS — M62512 Muscle wasting and atrophy, not elsewhere classified, left shoulder: Secondary | ICD-10-CM | POA: Diagnosis not present

## 2023-05-19 DIAGNOSIS — G609 Hereditary and idiopathic neuropathy, unspecified: Secondary | ICD-10-CM | POA: Diagnosis not present

## 2023-05-19 DIAGNOSIS — L84 Corns and callosities: Secondary | ICD-10-CM | POA: Diagnosis not present

## 2023-05-19 DIAGNOSIS — B351 Tinea unguium: Secondary | ICD-10-CM | POA: Diagnosis not present

## 2023-05-19 DIAGNOSIS — M62551 Muscle wasting and atrophy, not elsewhere classified, right thigh: Secondary | ICD-10-CM | POA: Diagnosis not present

## 2023-05-19 DIAGNOSIS — M2041 Other hammer toe(s) (acquired), right foot: Secondary | ICD-10-CM | POA: Diagnosis not present

## 2023-05-19 DIAGNOSIS — M79675 Pain in left toe(s): Secondary | ICD-10-CM | POA: Diagnosis not present

## 2023-05-19 DIAGNOSIS — R2681 Unsteadiness on feet: Secondary | ICD-10-CM | POA: Diagnosis not present

## 2023-05-19 DIAGNOSIS — R488 Other symbolic dysfunctions: Secondary | ICD-10-CM | POA: Diagnosis not present

## 2023-05-22 DIAGNOSIS — R4789 Other speech disturbances: Secondary | ICD-10-CM | POA: Diagnosis not present

## 2023-05-22 DIAGNOSIS — M62552 Muscle wasting and atrophy, not elsewhere classified, left thigh: Secondary | ICD-10-CM | POA: Diagnosis not present

## 2023-05-22 DIAGNOSIS — M62551 Muscle wasting and atrophy, not elsewhere classified, right thigh: Secondary | ICD-10-CM | POA: Diagnosis not present

## 2023-05-22 DIAGNOSIS — R2681 Unsteadiness on feet: Secondary | ICD-10-CM | POA: Diagnosis not present

## 2023-05-22 DIAGNOSIS — R488 Other symbolic dysfunctions: Secondary | ICD-10-CM | POA: Diagnosis not present

## 2023-05-22 DIAGNOSIS — M62512 Muscle wasting and atrophy, not elsewhere classified, left shoulder: Secondary | ICD-10-CM | POA: Diagnosis not present

## 2023-05-23 DIAGNOSIS — M62551 Muscle wasting and atrophy, not elsewhere classified, right thigh: Secondary | ICD-10-CM | POA: Diagnosis not present

## 2023-05-23 DIAGNOSIS — M62552 Muscle wasting and atrophy, not elsewhere classified, left thigh: Secondary | ICD-10-CM | POA: Diagnosis not present

## 2023-05-23 DIAGNOSIS — M62512 Muscle wasting and atrophy, not elsewhere classified, left shoulder: Secondary | ICD-10-CM | POA: Diagnosis not present

## 2023-05-23 DIAGNOSIS — R2681 Unsteadiness on feet: Secondary | ICD-10-CM | POA: Diagnosis not present

## 2023-05-23 DIAGNOSIS — R4789 Other speech disturbances: Secondary | ICD-10-CM | POA: Diagnosis not present

## 2023-05-23 DIAGNOSIS — R488 Other symbolic dysfunctions: Secondary | ICD-10-CM | POA: Diagnosis not present

## 2023-05-24 DIAGNOSIS — R4789 Other speech disturbances: Secondary | ICD-10-CM | POA: Diagnosis not present

## 2023-05-24 DIAGNOSIS — R2681 Unsteadiness on feet: Secondary | ICD-10-CM | POA: Diagnosis not present

## 2023-05-24 DIAGNOSIS — M62512 Muscle wasting and atrophy, not elsewhere classified, left shoulder: Secondary | ICD-10-CM | POA: Diagnosis not present

## 2023-05-24 DIAGNOSIS — M62552 Muscle wasting and atrophy, not elsewhere classified, left thigh: Secondary | ICD-10-CM | POA: Diagnosis not present

## 2023-05-24 DIAGNOSIS — M62551 Muscle wasting and atrophy, not elsewhere classified, right thigh: Secondary | ICD-10-CM | POA: Diagnosis not present

## 2023-05-24 DIAGNOSIS — R488 Other symbolic dysfunctions: Secondary | ICD-10-CM | POA: Diagnosis not present

## 2023-05-25 DIAGNOSIS — R4789 Other speech disturbances: Secondary | ICD-10-CM | POA: Diagnosis not present

## 2023-05-25 DIAGNOSIS — M62552 Muscle wasting and atrophy, not elsewhere classified, left thigh: Secondary | ICD-10-CM | POA: Diagnosis not present

## 2023-05-25 DIAGNOSIS — R2681 Unsteadiness on feet: Secondary | ICD-10-CM | POA: Diagnosis not present

## 2023-05-25 DIAGNOSIS — M62551 Muscle wasting and atrophy, not elsewhere classified, right thigh: Secondary | ICD-10-CM | POA: Diagnosis not present

## 2023-05-25 DIAGNOSIS — M62512 Muscle wasting and atrophy, not elsewhere classified, left shoulder: Secondary | ICD-10-CM | POA: Diagnosis not present

## 2023-05-25 DIAGNOSIS — R488 Other symbolic dysfunctions: Secondary | ICD-10-CM | POA: Diagnosis not present

## 2023-05-26 DIAGNOSIS — R2681 Unsteadiness on feet: Secondary | ICD-10-CM | POA: Diagnosis not present

## 2023-05-26 DIAGNOSIS — M62552 Muscle wasting and atrophy, not elsewhere classified, left thigh: Secondary | ICD-10-CM | POA: Diagnosis not present

## 2023-05-26 DIAGNOSIS — R4789 Other speech disturbances: Secondary | ICD-10-CM | POA: Diagnosis not present

## 2023-05-26 DIAGNOSIS — M62551 Muscle wasting and atrophy, not elsewhere classified, right thigh: Secondary | ICD-10-CM | POA: Diagnosis not present

## 2023-05-26 DIAGNOSIS — R488 Other symbolic dysfunctions: Secondary | ICD-10-CM | POA: Diagnosis not present

## 2023-05-26 DIAGNOSIS — M62512 Muscle wasting and atrophy, not elsewhere classified, left shoulder: Secondary | ICD-10-CM | POA: Diagnosis not present

## 2023-05-29 DIAGNOSIS — M62551 Muscle wasting and atrophy, not elsewhere classified, right thigh: Secondary | ICD-10-CM | POA: Diagnosis not present

## 2023-05-29 DIAGNOSIS — R2681 Unsteadiness on feet: Secondary | ICD-10-CM | POA: Diagnosis not present

## 2023-05-29 DIAGNOSIS — M62512 Muscle wasting and atrophy, not elsewhere classified, left shoulder: Secondary | ICD-10-CM | POA: Diagnosis not present

## 2023-05-29 DIAGNOSIS — R488 Other symbolic dysfunctions: Secondary | ICD-10-CM | POA: Diagnosis not present

## 2023-05-29 DIAGNOSIS — M62552 Muscle wasting and atrophy, not elsewhere classified, left thigh: Secondary | ICD-10-CM | POA: Diagnosis not present

## 2023-05-29 DIAGNOSIS — R4789 Other speech disturbances: Secondary | ICD-10-CM | POA: Diagnosis not present

## 2023-05-30 DIAGNOSIS — R488 Other symbolic dysfunctions: Secondary | ICD-10-CM | POA: Diagnosis not present

## 2023-05-30 DIAGNOSIS — M62512 Muscle wasting and atrophy, not elsewhere classified, left shoulder: Secondary | ICD-10-CM | POA: Diagnosis not present

## 2023-05-30 DIAGNOSIS — R4789 Other speech disturbances: Secondary | ICD-10-CM | POA: Diagnosis not present

## 2023-05-30 DIAGNOSIS — M62551 Muscle wasting and atrophy, not elsewhere classified, right thigh: Secondary | ICD-10-CM | POA: Diagnosis not present

## 2023-05-30 DIAGNOSIS — R2681 Unsteadiness on feet: Secondary | ICD-10-CM | POA: Diagnosis not present

## 2023-05-30 DIAGNOSIS — M62552 Muscle wasting and atrophy, not elsewhere classified, left thigh: Secondary | ICD-10-CM | POA: Diagnosis not present

## 2023-05-31 DIAGNOSIS — R2681 Unsteadiness on feet: Secondary | ICD-10-CM | POA: Diagnosis not present

## 2023-05-31 DIAGNOSIS — M159 Polyosteoarthritis, unspecified: Secondary | ICD-10-CM | POA: Diagnosis not present

## 2023-05-31 DIAGNOSIS — R488 Other symbolic dysfunctions: Secondary | ICD-10-CM | POA: Diagnosis not present

## 2023-05-31 DIAGNOSIS — M62512 Muscle wasting and atrophy, not elsewhere classified, left shoulder: Secondary | ICD-10-CM | POA: Diagnosis not present

## 2023-05-31 DIAGNOSIS — M62551 Muscle wasting and atrophy, not elsewhere classified, right thigh: Secondary | ICD-10-CM | POA: Diagnosis not present

## 2023-05-31 DIAGNOSIS — M62552 Muscle wasting and atrophy, not elsewhere classified, left thigh: Secondary | ICD-10-CM | POA: Diagnosis not present

## 2023-05-31 DIAGNOSIS — R4789 Other speech disturbances: Secondary | ICD-10-CM | POA: Diagnosis not present

## 2023-05-31 DIAGNOSIS — F330 Major depressive disorder, recurrent, mild: Secondary | ICD-10-CM | POA: Diagnosis not present

## 2023-06-01 DIAGNOSIS — R4789 Other speech disturbances: Secondary | ICD-10-CM | POA: Diagnosis not present

## 2023-06-01 DIAGNOSIS — M62551 Muscle wasting and atrophy, not elsewhere classified, right thigh: Secondary | ICD-10-CM | POA: Diagnosis not present

## 2023-06-01 DIAGNOSIS — R488 Other symbolic dysfunctions: Secondary | ICD-10-CM | POA: Diagnosis not present

## 2023-06-01 DIAGNOSIS — M62552 Muscle wasting and atrophy, not elsewhere classified, left thigh: Secondary | ICD-10-CM | POA: Diagnosis not present

## 2023-06-01 DIAGNOSIS — R2681 Unsteadiness on feet: Secondary | ICD-10-CM | POA: Diagnosis not present

## 2023-06-01 DIAGNOSIS — M62512 Muscle wasting and atrophy, not elsewhere classified, left shoulder: Secondary | ICD-10-CM | POA: Diagnosis not present

## 2023-06-02 DIAGNOSIS — M62512 Muscle wasting and atrophy, not elsewhere classified, left shoulder: Secondary | ICD-10-CM | POA: Diagnosis not present

## 2023-06-02 DIAGNOSIS — R2681 Unsteadiness on feet: Secondary | ICD-10-CM | POA: Diagnosis not present

## 2023-06-02 DIAGNOSIS — M62552 Muscle wasting and atrophy, not elsewhere classified, left thigh: Secondary | ICD-10-CM | POA: Diagnosis not present

## 2023-06-02 DIAGNOSIS — R488 Other symbolic dysfunctions: Secondary | ICD-10-CM | POA: Diagnosis not present

## 2023-06-02 DIAGNOSIS — M62551 Muscle wasting and atrophy, not elsewhere classified, right thigh: Secondary | ICD-10-CM | POA: Diagnosis not present

## 2023-06-02 DIAGNOSIS — R4789 Other speech disturbances: Secondary | ICD-10-CM | POA: Diagnosis not present

## 2023-06-04 DIAGNOSIS — R2681 Unsteadiness on feet: Secondary | ICD-10-CM | POA: Diagnosis not present

## 2023-06-04 DIAGNOSIS — R4789 Other speech disturbances: Secondary | ICD-10-CM | POA: Diagnosis not present

## 2023-06-04 DIAGNOSIS — R488 Other symbolic dysfunctions: Secondary | ICD-10-CM | POA: Diagnosis not present

## 2023-06-04 DIAGNOSIS — M62552 Muscle wasting and atrophy, not elsewhere classified, left thigh: Secondary | ICD-10-CM | POA: Diagnosis not present

## 2023-06-04 DIAGNOSIS — M62512 Muscle wasting and atrophy, not elsewhere classified, left shoulder: Secondary | ICD-10-CM | POA: Diagnosis not present

## 2023-06-04 DIAGNOSIS — M62551 Muscle wasting and atrophy, not elsewhere classified, right thigh: Secondary | ICD-10-CM | POA: Diagnosis not present

## 2023-06-05 ENCOUNTER — Other Ambulatory Visit: Payer: Self-pay

## 2023-06-05 ENCOUNTER — Emergency Department (HOSPITAL_COMMUNITY): Payer: Medicare Other

## 2023-06-05 ENCOUNTER — Encounter (HOSPITAL_COMMUNITY): Payer: Self-pay

## 2023-06-05 ENCOUNTER — Emergency Department (HOSPITAL_COMMUNITY)
Admission: EM | Admit: 2023-06-05 | Discharge: 2023-06-05 | Disposition: A | Discharge status: Skilled Nursing Facility | Payer: Medicare Other | Attending: Emergency Medicine | Admitting: Emergency Medicine

## 2023-06-05 DIAGNOSIS — Z96612 Presence of left artificial shoulder joint: Secondary | ICD-10-CM | POA: Diagnosis not present

## 2023-06-05 DIAGNOSIS — W19XXXA Unspecified fall, initial encounter: Secondary | ICD-10-CM | POA: Diagnosis not present

## 2023-06-05 DIAGNOSIS — I6782 Cerebral ischemia: Secondary | ICD-10-CM | POA: Diagnosis not present

## 2023-06-05 DIAGNOSIS — M62521 Muscle wasting and atrophy, not elsewhere classified, right upper arm: Secondary | ICD-10-CM | POA: Diagnosis not present

## 2023-06-05 DIAGNOSIS — M62551 Muscle wasting and atrophy, not elsewhere classified, right thigh: Secondary | ICD-10-CM | POA: Diagnosis not present

## 2023-06-05 DIAGNOSIS — Z471 Aftercare following joint replacement surgery: Secondary | ICD-10-CM | POA: Diagnosis not present

## 2023-06-05 DIAGNOSIS — M25512 Pain in left shoulder: Secondary | ICD-10-CM | POA: Diagnosis not present

## 2023-06-05 DIAGNOSIS — R519 Headache, unspecified: Secondary | ICD-10-CM | POA: Diagnosis not present

## 2023-06-05 DIAGNOSIS — M62512 Muscle wasting and atrophy, not elsewhere classified, left shoulder: Secondary | ICD-10-CM | POA: Diagnosis not present

## 2023-06-05 DIAGNOSIS — R42 Dizziness and giddiness: Secondary | ICD-10-CM | POA: Diagnosis not present

## 2023-06-05 DIAGNOSIS — Z79899 Other long term (current) drug therapy: Secondary | ICD-10-CM | POA: Diagnosis not present

## 2023-06-05 DIAGNOSIS — S0990XA Unspecified injury of head, initial encounter: Secondary | ICD-10-CM | POA: Diagnosis not present

## 2023-06-05 DIAGNOSIS — M542 Cervicalgia: Secondary | ICD-10-CM | POA: Diagnosis not present

## 2023-06-05 DIAGNOSIS — I10 Essential (primary) hypertension: Secondary | ICD-10-CM | POA: Diagnosis not present

## 2023-06-05 DIAGNOSIS — M159 Polyosteoarthritis, unspecified: Secondary | ICD-10-CM | POA: Diagnosis not present

## 2023-06-05 DIAGNOSIS — M62562 Muscle wasting and atrophy, not elsewhere classified, left lower leg: Secondary | ICD-10-CM | POA: Diagnosis not present

## 2023-06-05 DIAGNOSIS — M62561 Muscle wasting and atrophy, not elsewhere classified, right lower leg: Secondary | ICD-10-CM | POA: Diagnosis not present

## 2023-06-05 DIAGNOSIS — G4489 Other headache syndrome: Secondary | ICD-10-CM | POA: Diagnosis not present

## 2023-06-05 DIAGNOSIS — W06XXXA Fall from bed, initial encounter: Secondary | ICD-10-CM | POA: Insufficient documentation

## 2023-06-05 DIAGNOSIS — G319 Degenerative disease of nervous system, unspecified: Secondary | ICD-10-CM | POA: Diagnosis not present

## 2023-06-05 DIAGNOSIS — R2681 Unsteadiness on feet: Secondary | ICD-10-CM | POA: Diagnosis not present

## 2023-06-05 DIAGNOSIS — M62552 Muscle wasting and atrophy, not elsewhere classified, left thigh: Secondary | ICD-10-CM | POA: Diagnosis not present

## 2023-06-05 DIAGNOSIS — R488 Other symbolic dysfunctions: Secondary | ICD-10-CM | POA: Diagnosis not present

## 2023-06-05 DIAGNOSIS — R9431 Abnormal electrocardiogram [ECG] [EKG]: Secondary | ICD-10-CM | POA: Diagnosis not present

## 2023-06-05 DIAGNOSIS — R4789 Other speech disturbances: Secondary | ICD-10-CM | POA: Diagnosis not present

## 2023-06-05 DIAGNOSIS — M62522 Muscle wasting and atrophy, not elsewhere classified, left upper arm: Secondary | ICD-10-CM | POA: Diagnosis not present

## 2023-06-05 NOTE — ED Notes (Signed)
Daughter is at bedside and states daughter is taking pt back to facility. Pt is able to walk with assistance. Uses a walker at home and daughter has pt walker in her car.

## 2023-06-05 NOTE — ED Notes (Signed)
Patient transported to X-ray 

## 2023-06-05 NOTE — Discharge Instructions (Signed)
As discussed, your evaluation today has been largely reassuring.  But, it is important that you monitor your condition carefully, and do not hesitate to return to the ED if you develop new, or concerning changes in your condition. ? ?Otherwise, please follow-up with your physician for appropriate ongoing care. ? ?

## 2023-06-05 NOTE — ED Triage Notes (Signed)
Arrives EMS from [facility] after rolling out of bed and hitting head on bedside table.   Denies LOC, and anticoagulants.

## 2023-06-05 NOTE — ED Notes (Addendum)
[facility]AR called, eta hour or less

## 2023-06-05 NOTE — ED Provider Notes (Signed)
[place] EMERGENCY DEPARTMENT AT [facility] Provider Note   CSN: [id number] Arrival date & time: 06/05/23  4098     History  Chief Complaint  Patient presents with   Donald Todd    BEAMON MASSIAH is a 89 y.o. male.  HPI Patient presents from home via EMS after a fall.  Patient feels as though he rolled out of bed, struck his left side of his head, no loss of consciousness, no new weakness, numbness tingling vision changes, neck pain.  On secondary discussion the patient describes pain in his left shoulder with prior replacement as well, but has no limited range of motion.     Home Medications Prior to Admission medications   Medication Sig Start Date End Date Taking? Authorizing Provider  acetaminophen (TYLENOL) 325 MG tablet Take 1-2 tablets (325-650 mg total) by mouth every 4 (four) hours as needed for mild pain. 02/21/23   Love, Evlyn Kanner, PA-C  amLODipine (NORVASC) 5 MG tablet Take 1 tablet (5 mg total) by mouth daily. 04/04/23 04/03/24  Horton Chin, MD  aspirin 81 MG tablet Take 81 mg by mouth at bedtime.    [provider]  atorvastatin (LIPITOR) 40 MG tablet TAKE 1 TABLET AT BEDTIME 03/07/23   Runell Gess, MD  calcium carbonate (TUMS - DOSED IN MG ELEMENTAL CALCIUM) 500 MG chewable tablet Chew 1,000 mg by mouth daily as needed for indigestion or heartburn.    [provider]  docusate sodium (COLACE) 100 MG capsule Take 1 capsule (100 mg total) by mouth every 12 (twelve) hours. Patient taking differently: Take 200 mg by mouth daily. 03/10/21   Dione Booze, MD  ipratropium (ATROVENT) 0.03 % nasal spray Place 1-2 sprays into both nostrils 2 (two) times daily as needed (allergies). 05/29/20   [provider]  loratadine (CLARITIN) 10 MG tablet Take 10 mg by mouth daily. 09/29/21   [provider]  meclizine (ANTIVERT) 25 MG tablet Take 25 mg by mouth as needed for dizziness.    [provider]  Multiple Vitamins-Minerals  (PRESERVISION AREDS 2) CAPS Take 1 capsule by mouth 2 (two) times daily.    [provider]  nitroGLYCERIN (NITROSTAT) 0.4 MG SL tablet Place 1 tablet (0.4 mg total) under the tongue as needed. 05/10/21   Runell Gess, MD  polyethylene glycol (MIRALAX / GLYCOLAX) 17 g packet Take 17 g by mouth daily. 03/10/21   Dione Booze, MD  QUEtiapine (SEROQUEL) 25 MG tablet Take 1 tablet (25 mg total) by mouth at bedtime. 02/21/23   Love, Evlyn Kanner, PA-C  tamsulosin (FLOMAX) 0.4 MG CAPS capsule Take 1-2 capsules (0.4-0.8 mg total) by mouth daily after supper. 04/04/23   Horton Chin, MD      Allergies    Tape, Hydrocodone, Iohexol, and Oxycodone    Review of Systems   Review of Systems  All other systems reviewed and are negative.   Physical Exam Updated Vital Signs BP (!) 164/68   Pulse (!) 59   Temp 97.6 F (36.4 C) (Oral)   Resp 19   Ht 5\' 7"  (1.702 m)   Wt 58.1 kg   SpO2 100%   BMI 20.05 kg/m  Physical Exam Vitals and nursing note reviewed.  Constitutional:      General: He is not in acute distress.    Appearance: He is well-developed.  HENT:     Head: Normocephalic and atraumatic.   Eyes:     Conjunctiva/sclera: Conjunctivae normal.  Neck:   Cardiovascular:     Rate and Rhythm: Normal rate and regular rhythm.  Pulmonary:     Effort: Pulmonary effort is normal. No respiratory distress.     Breath sounds: No stridor.  Abdominal:     General: There is no distension.  Musculoskeletal:     Comments: No limited range of motion, no deformity, no crepitus  Skin:    General: Skin is warm and dry.  Neurological:     General: No focal deficit present.     Mental Status: He is alert and oriented to person, place, and time.     Motor: No weakness, tremor or abnormal muscle tone.     ED Results / Procedures / Treatments   Labs (all labs ordered are listed, but only abnormal results are displayed) Labs Reviewed - No data to display  EKG EKG  Interpretation Date/Time:  Monday June 05 2023 06:57:27 EDT Ventricular Rate:  65 PR Interval:  85 QRS Duration:  99 QT Interval:  401 QTC Calculation: 417 R Axis:   -73  Text Interpretation: Sinus rhythm Artifact Abnormal ECG Confirmed by Gerhard Munch [phone]) on 06/05/2023 7:12:10 AM  Radiology DG Shoulder Left  Result Date: 06/05/2023 CLINICAL DATA:  Fall, left shoulder pain. EXAM: LEFT SHOULDER - 2+ VIEW COMPARISON:  Shoulder radiographs 05/26/2017 FINDINGS: Postsurgical changes reflecting reverse shoulder arthroplasty are seen. There is no evidence of acute fracture or dislocation. There is no hardware related complication. Degenerative change of the acromioclavicular joint is stable. The soft tissues are unremarkable. A remote fracture of the left posterior seventh rib is noted. IMPRESSION: Status post reverse shoulder arthroplasty without evidence of complication or acute fracture. Electronically Signed   By: Lesia Hausen M.D.   On: 06/05/2023 08:09   CT Head Wo Contrast  Result Date: 06/05/2023 CLINICAL DATA:  History: Polytrauma, blunt. EXAM: CT HEAD WITHOUT CONTRAST TECHNIQUE: Contiguous axial images were obtained from the base of the skull through the vertex without intravenous contrast. RADIATION DOSE REDUCTION: This exam was performed according to the departmental dose-optimization program which includes automated exposure control, adjustment of the mA and/or kV according to patient size and/or use of iterative reconstruction technique. COMPARISON:  Brain MRI 06/18/2020.  Head CT 06/18/2020. FINDINGS: Brain: Generalized cerebral atrophy. Patchy and ill-defined hypoattenuation within the cerebral white matter, nonspecific but compatible with moderate chronic small vessel ischemic disease. There is no acute intracranial hemorrhage. No demarcated cortical infarct. No extra-axial fluid collection. No evidence of an intracranial mass. No midline shift. Vascular: No hyperdense vessel.   Atherosclerotic calcifications. Skull: No calvarial fracture or aggressive osseous lesion. Sinuses/Orbits: No mass or acute finding within the imaged orbits. Minimal mucosal thickening within the bilateral ethmoid, right sphenoid and right maxillary sinuses at the imaged levels. IMPRESSION: 1.  No evidence of an acute intracranial abnormality. 2. Moderate chronic small vessel ischemic changes within the cerebral white matter. 3. Generalized cerebral atrophy. 4. Mild paranasal sinus mucosal thickening at the imaged levels. Electronically Signed   By: Jackey Loge D.O.   On: 06/05/2023 08:05    Procedures Procedures    Medications Ordered in ED Medications - No data to display  ED Course/ Medical Decision Making/ A&P                             Medical Decision Making Presents after mechanical fall.  Given age, use of aspirin, patient monitored, CT, x-ray ordered.  Cardiac 60 sinus normal  Pulse ox 100% room air normal   Amount and/or Complexity of Data Reviewed Independent Historian: EMS    Details: Unremarkable in transport Radiology: ordered and independent interpretation performed. Decision-making details documented in ED Course.  Risk Decision regarding hospitalization.   8:49 AM Patient awake, alert, in no distress.  After being monitored for duration of ED stay without arrhythmia, without other alarming findings, and after I reviewed his CT, x-ray results, no intracranial, nor shoulder abnormalities, patient appropriate for discharge.        Final Clinical Impression(s) / ED Diagnoses Final diagnoses:  Fall, initial encounter    Rx / DC Orders ED Discharge Orders     None         Gerhard Munch, MD 06/05/23 [phone]

## 2023-06-06 DIAGNOSIS — M62512 Muscle wasting and atrophy, not elsewhere classified, left shoulder: Secondary | ICD-10-CM | POA: Diagnosis not present

## 2023-06-06 DIAGNOSIS — R2681 Unsteadiness on feet: Secondary | ICD-10-CM | POA: Diagnosis not present

## 2023-06-06 DIAGNOSIS — R488 Other symbolic dysfunctions: Secondary | ICD-10-CM | POA: Diagnosis not present

## 2023-06-06 DIAGNOSIS — M62552 Muscle wasting and atrophy, not elsewhere classified, left thigh: Secondary | ICD-10-CM | POA: Diagnosis not present

## 2023-06-06 DIAGNOSIS — R4789 Other speech disturbances: Secondary | ICD-10-CM | POA: Diagnosis not present

## 2023-06-06 DIAGNOSIS — M62551 Muscle wasting and atrophy, not elsewhere classified, right thigh: Secondary | ICD-10-CM | POA: Diagnosis not present

## 2023-06-07 DIAGNOSIS — M62512 Muscle wasting and atrophy, not elsewhere classified, left shoulder: Secondary | ICD-10-CM | POA: Diagnosis not present

## 2023-06-07 DIAGNOSIS — R2681 Unsteadiness on feet: Secondary | ICD-10-CM | POA: Diagnosis not present

## 2023-06-07 DIAGNOSIS — M62551 Muscle wasting and atrophy, not elsewhere classified, right thigh: Secondary | ICD-10-CM | POA: Diagnosis not present

## 2023-06-07 DIAGNOSIS — M62552 Muscle wasting and atrophy, not elsewhere classified, left thigh: Secondary | ICD-10-CM | POA: Diagnosis not present

## 2023-06-07 DIAGNOSIS — R4789 Other speech disturbances: Secondary | ICD-10-CM | POA: Diagnosis not present

## 2023-06-07 DIAGNOSIS — R488 Other symbolic dysfunctions: Secondary | ICD-10-CM | POA: Diagnosis not present

## 2023-06-09 ENCOUNTER — Telehealth: Payer: Self-pay | Admitting: Cardiovascular Disease

## 2023-06-09 MED ORDER — AMLODIPINE BESYLATE 5 MG PO TABS: 5.0000 mg | ORAL_TABLET | Freq: Every day | ORAL | 3 refills | Status: DC

## 2023-06-09 NOTE — Telephone Encounter (Signed)
Patient notified of refill.

## 2023-06-09 NOTE — Telephone Encounter (Signed)
*  STAT* If patient is at the pharmacy, call can be transferred to refill team.   1. Which medications need to be refilled? (please list name of each medication and dose if known)   amLODipine (NORVASC) 5 MG tablet    2. Which pharmacy/location (including street and city if local pharmacy) is medication to be sent to? [facility] - [place], [place] - [address]   3. Do they need a 30 day or 90 day supply?  90 day  

## 2023-06-13 DIAGNOSIS — R441 Visual hallucinations: Secondary | ICD-10-CM | POA: Diagnosis not present

## 2023-06-13 DIAGNOSIS — R4789 Other speech disturbances: Secondary | ICD-10-CM | POA: Diagnosis not present

## 2023-06-13 DIAGNOSIS — R488 Other symbolic dysfunctions: Secondary | ICD-10-CM | POA: Diagnosis not present

## 2023-06-13 DIAGNOSIS — M62512 Muscle wasting and atrophy, not elsewhere classified, left shoulder: Secondary | ICD-10-CM | POA: Diagnosis not present

## 2023-06-13 DIAGNOSIS — F330 Major depressive disorder, recurrent, mild: Secondary | ICD-10-CM | POA: Diagnosis not present

## 2023-06-13 DIAGNOSIS — M62552 Muscle wasting and atrophy, not elsewhere classified, left thigh: Secondary | ICD-10-CM | POA: Diagnosis not present

## 2023-06-13 DIAGNOSIS — R2681 Unsteadiness on feet: Secondary | ICD-10-CM | POA: Diagnosis not present

## 2023-06-13 DIAGNOSIS — G3184 Mild cognitive impairment, so stated: Secondary | ICD-10-CM | POA: Diagnosis not present

## 2023-06-13 DIAGNOSIS — M62551 Muscle wasting and atrophy, not elsewhere classified, right thigh: Secondary | ICD-10-CM | POA: Diagnosis not present

## 2023-06-14 DIAGNOSIS — M62551 Muscle wasting and atrophy, not elsewhere classified, right thigh: Secondary | ICD-10-CM | POA: Diagnosis not present

## 2023-06-14 DIAGNOSIS — M62512 Muscle wasting and atrophy, not elsewhere classified, left shoulder: Secondary | ICD-10-CM | POA: Diagnosis not present

## 2023-06-14 DIAGNOSIS — M62552 Muscle wasting and atrophy, not elsewhere classified, left thigh: Secondary | ICD-10-CM | POA: Diagnosis not present

## 2023-06-14 DIAGNOSIS — R4789 Other speech disturbances: Secondary | ICD-10-CM | POA: Diagnosis not present

## 2023-06-14 DIAGNOSIS — R488 Other symbolic dysfunctions: Secondary | ICD-10-CM | POA: Diagnosis not present

## 2023-06-14 DIAGNOSIS — R2681 Unsteadiness on feet: Secondary | ICD-10-CM | POA: Diagnosis not present

## 2023-06-16 DIAGNOSIS — M62552 Muscle wasting and atrophy, not elsewhere classified, left thigh: Secondary | ICD-10-CM | POA: Diagnosis not present

## 2023-06-16 DIAGNOSIS — R488 Other symbolic dysfunctions: Secondary | ICD-10-CM | POA: Diagnosis not present

## 2023-06-16 DIAGNOSIS — R2681 Unsteadiness on feet: Secondary | ICD-10-CM | POA: Diagnosis not present

## 2023-06-16 DIAGNOSIS — M62512 Muscle wasting and atrophy, not elsewhere classified, left shoulder: Secondary | ICD-10-CM | POA: Diagnosis not present

## 2023-06-16 DIAGNOSIS — R4789 Other speech disturbances: Secondary | ICD-10-CM | POA: Diagnosis not present

## 2023-06-16 DIAGNOSIS — M62551 Muscle wasting and atrophy, not elsewhere classified, right thigh: Secondary | ICD-10-CM | POA: Diagnosis not present

## 2023-06-19 DIAGNOSIS — R4789 Other speech disturbances: Secondary | ICD-10-CM | POA: Diagnosis not present

## 2023-06-19 DIAGNOSIS — M62552 Muscle wasting and atrophy, not elsewhere classified, left thigh: Secondary | ICD-10-CM | POA: Diagnosis not present

## 2023-06-19 DIAGNOSIS — R488 Other symbolic dysfunctions: Secondary | ICD-10-CM | POA: Diagnosis not present

## 2023-06-19 DIAGNOSIS — M62512 Muscle wasting and atrophy, not elsewhere classified, left shoulder: Secondary | ICD-10-CM | POA: Diagnosis not present

## 2023-06-19 DIAGNOSIS — R2681 Unsteadiness on feet: Secondary | ICD-10-CM | POA: Diagnosis not present

## 2023-06-19 DIAGNOSIS — M62551 Muscle wasting and atrophy, not elsewhere classified, right thigh: Secondary | ICD-10-CM | POA: Diagnosis not present

## 2023-06-26 DIAGNOSIS — R7303 Prediabetes: Secondary | ICD-10-CM | POA: Diagnosis not present

## 2023-06-26 DIAGNOSIS — R42 Dizziness and giddiness: Secondary | ICD-10-CM | POA: Diagnosis not present

## 2023-06-26 DIAGNOSIS — E785 Hyperlipidemia, unspecified: Secondary | ICD-10-CM | POA: Diagnosis not present

## 2023-06-26 DIAGNOSIS — K5900 Constipation, unspecified: Secondary | ICD-10-CM | POA: Diagnosis not present

## 2023-06-26 DIAGNOSIS — M159 Polyosteoarthritis, unspecified: Secondary | ICD-10-CM | POA: Diagnosis not present

## 2023-06-26 DIAGNOSIS — H353130 Nonexudative age-related macular degeneration, bilateral, stage unspecified: Secondary | ICD-10-CM | POA: Diagnosis not present

## 2023-06-26 DIAGNOSIS — N401 Enlarged prostate with lower urinary tract symptoms: Secondary | ICD-10-CM | POA: Diagnosis not present

## 2023-06-26 DIAGNOSIS — I10 Essential (primary) hypertension: Secondary | ICD-10-CM | POA: Diagnosis not present

## 2023-06-27 DIAGNOSIS — R4789 Other speech disturbances: Secondary | ICD-10-CM | POA: Diagnosis not present

## 2023-06-27 DIAGNOSIS — M62512 Muscle wasting and atrophy, not elsewhere classified, left shoulder: Secondary | ICD-10-CM | POA: Diagnosis not present

## 2023-06-27 DIAGNOSIS — R488 Other symbolic dysfunctions: Secondary | ICD-10-CM | POA: Diagnosis not present

## 2023-06-27 DIAGNOSIS — M62551 Muscle wasting and atrophy, not elsewhere classified, right thigh: Secondary | ICD-10-CM | POA: Diagnosis not present

## 2023-06-27 DIAGNOSIS — M62552 Muscle wasting and atrophy, not elsewhere classified, left thigh: Secondary | ICD-10-CM | POA: Diagnosis not present

## 2023-06-27 DIAGNOSIS — R2681 Unsteadiness on feet: Secondary | ICD-10-CM | POA: Diagnosis not present

## 2023-06-28 DIAGNOSIS — H35371 Puckering of macula, right eye: Secondary | ICD-10-CM | POA: Diagnosis not present

## 2023-06-28 DIAGNOSIS — H353231 Exudative age-related macular degeneration, bilateral, with active choroidal neovascularization: Secondary | ICD-10-CM | POA: Diagnosis not present

## 2023-06-28 DIAGNOSIS — Z961 Presence of intraocular lens: Secondary | ICD-10-CM | POA: Diagnosis not present

## 2023-06-30 DIAGNOSIS — M62552 Muscle wasting and atrophy, not elsewhere classified, left thigh: Secondary | ICD-10-CM | POA: Diagnosis not present

## 2023-06-30 DIAGNOSIS — I10 Essential (primary) hypertension: Secondary | ICD-10-CM | POA: Diagnosis not present

## 2023-06-30 DIAGNOSIS — R2681 Unsteadiness on feet: Secondary | ICD-10-CM | POA: Diagnosis not present

## 2023-06-30 DIAGNOSIS — R488 Other symbolic dysfunctions: Secondary | ICD-10-CM | POA: Diagnosis not present

## 2023-06-30 DIAGNOSIS — M62551 Muscle wasting and atrophy, not elsewhere classified, right thigh: Secondary | ICD-10-CM | POA: Diagnosis not present

## 2023-06-30 DIAGNOSIS — M62512 Muscle wasting and atrophy, not elsewhere classified, left shoulder: Secondary | ICD-10-CM | POA: Diagnosis not present

## 2023-06-30 DIAGNOSIS — E785 Hyperlipidemia, unspecified: Secondary | ICD-10-CM | POA: Diagnosis not present

## 2023-06-30 DIAGNOSIS — R4789 Other speech disturbances: Secondary | ICD-10-CM | POA: Diagnosis not present

## 2023-07-24 DIAGNOSIS — K5900 Constipation, unspecified: Secondary | ICD-10-CM | POA: Diagnosis not present

## 2023-07-24 DIAGNOSIS — N401 Enlarged prostate with lower urinary tract symptoms: Secondary | ICD-10-CM | POA: Diagnosis not present

## 2023-07-24 DIAGNOSIS — M159 Polyosteoarthritis, unspecified: Secondary | ICD-10-CM | POA: Diagnosis not present

## 2023-07-24 DIAGNOSIS — J309 Allergic rhinitis, unspecified: Secondary | ICD-10-CM | POA: Diagnosis not present

## 2023-07-24 DIAGNOSIS — E785 Hyperlipidemia, unspecified: Secondary | ICD-10-CM | POA: Diagnosis not present

## 2023-07-24 DIAGNOSIS — I10 Essential (primary) hypertension: Secondary | ICD-10-CM | POA: Diagnosis not present

## 2023-07-24 DIAGNOSIS — R42 Dizziness and giddiness: Secondary | ICD-10-CM | POA: Diagnosis not present

## 2023-07-24 DIAGNOSIS — H353130 Nonexudative age-related macular degeneration, bilateral, stage unspecified: Secondary | ICD-10-CM | POA: Diagnosis not present

## 2023-07-24 DIAGNOSIS — R7303 Prediabetes: Secondary | ICD-10-CM | POA: Diagnosis not present

## 2023-07-25 DIAGNOSIS — R443 Hallucinations, unspecified: Secondary | ICD-10-CM | POA: Diagnosis not present

## 2023-07-25 DIAGNOSIS — F330 Major depressive disorder, recurrent, mild: Secondary | ICD-10-CM | POA: Diagnosis not present

## 2023-07-25 DIAGNOSIS — Z79899 Other long term (current) drug therapy: Secondary | ICD-10-CM | POA: Diagnosis not present

## 2023-07-25 DIAGNOSIS — G3184 Mild cognitive impairment, so stated: Secondary | ICD-10-CM | POA: Diagnosis not present

## 2023-08-02 DIAGNOSIS — H353231 Exudative age-related macular degeneration, bilateral, with active choroidal neovascularization: Secondary | ICD-10-CM | POA: Diagnosis not present

## 2023-08-04 DIAGNOSIS — I10 Essential (primary) hypertension: Secondary | ICD-10-CM | POA: Diagnosis not present

## 2023-08-04 DIAGNOSIS — E785 Hyperlipidemia, unspecified: Secondary | ICD-10-CM | POA: Diagnosis not present

## 2023-08-07 ENCOUNTER — Other Ambulatory Visit: Payer: Self-pay | Admitting: Cardiovascular Disease

## 2023-08-08 DIAGNOSIS — G3184 Mild cognitive impairment, so stated: Secondary | ICD-10-CM | POA: Diagnosis not present

## 2023-08-08 DIAGNOSIS — R443 Hallucinations, unspecified: Secondary | ICD-10-CM | POA: Diagnosis not present

## 2023-08-08 DIAGNOSIS — F331 Major depressive disorder, recurrent, moderate: Secondary | ICD-10-CM | POA: Diagnosis not present

## 2023-08-08 DIAGNOSIS — Z79899 Other long term (current) drug therapy: Secondary | ICD-10-CM | POA: Diagnosis not present

## 2023-08-18 DIAGNOSIS — L84 Corns and callosities: Secondary | ICD-10-CM | POA: Diagnosis not present

## 2023-08-18 DIAGNOSIS — B351 Tinea unguium: Secondary | ICD-10-CM | POA: Diagnosis not present

## 2023-08-18 DIAGNOSIS — M79675 Pain in left toe(s): Secondary | ICD-10-CM | POA: Diagnosis not present

## 2023-08-18 DIAGNOSIS — G609 Hereditary and idiopathic neuropathy, unspecified: Secondary | ICD-10-CM | POA: Diagnosis not present

## 2023-08-18 DIAGNOSIS — M2041 Other hammer toe(s) (acquired), right foot: Secondary | ICD-10-CM | POA: Diagnosis not present

## 2023-08-21 DIAGNOSIS — J309 Allergic rhinitis, unspecified: Secondary | ICD-10-CM | POA: Diagnosis not present

## 2023-08-21 DIAGNOSIS — I10 Essential (primary) hypertension: Secondary | ICD-10-CM | POA: Diagnosis not present

## 2023-08-21 DIAGNOSIS — K5900 Constipation, unspecified: Secondary | ICD-10-CM | POA: Diagnosis not present

## 2023-08-21 DIAGNOSIS — E785 Hyperlipidemia, unspecified: Secondary | ICD-10-CM | POA: Diagnosis not present

## 2023-08-21 DIAGNOSIS — M159 Polyosteoarthritis, unspecified: Secondary | ICD-10-CM | POA: Diagnosis not present

## 2023-08-21 DIAGNOSIS — H353130 Nonexudative age-related macular degeneration, bilateral, stage unspecified: Secondary | ICD-10-CM | POA: Diagnosis not present

## 2023-08-21 DIAGNOSIS — R7303 Prediabetes: Secondary | ICD-10-CM | POA: Diagnosis not present

## 2023-08-21 DIAGNOSIS — N401 Enlarged prostate with lower urinary tract symptoms: Secondary | ICD-10-CM | POA: Diagnosis not present

## 2023-08-21 DIAGNOSIS — R42 Dizziness and giddiness: Secondary | ICD-10-CM | POA: Diagnosis not present

## 2023-08-22 DIAGNOSIS — Z79899 Other long term (current) drug therapy: Secondary | ICD-10-CM | POA: Diagnosis not present

## 2023-08-22 DIAGNOSIS — F331 Major depressive disorder, recurrent, moderate: Secondary | ICD-10-CM | POA: Diagnosis not present

## 2023-08-22 DIAGNOSIS — R443 Hallucinations, unspecified: Secondary | ICD-10-CM | POA: Diagnosis not present

## 2023-08-22 DIAGNOSIS — G3184 Mild cognitive impairment, so stated: Secondary | ICD-10-CM | POA: Diagnosis not present

## 2023-08-31 DIAGNOSIS — N401 Enlarged prostate with lower urinary tract symptoms: Secondary | ICD-10-CM | POA: Diagnosis not present

## 2023-08-31 DIAGNOSIS — H353211 Exudative age-related macular degeneration, right eye, with active choroidal neovascularization: Secondary | ICD-10-CM | POA: Diagnosis not present

## 2023-08-31 DIAGNOSIS — R7303 Prediabetes: Secondary | ICD-10-CM | POA: Diagnosis not present

## 2023-08-31 DIAGNOSIS — E785 Hyperlipidemia, unspecified: Secondary | ICD-10-CM | POA: Diagnosis not present

## 2023-08-31 DIAGNOSIS — I10 Essential (primary) hypertension: Secondary | ICD-10-CM | POA: Diagnosis not present

## 2023-09-05 DIAGNOSIS — R441 Visual hallucinations: Secondary | ICD-10-CM | POA: Diagnosis not present

## 2023-09-05 DIAGNOSIS — F331 Major depressive disorder, recurrent, moderate: Secondary | ICD-10-CM | POA: Diagnosis not present

## 2023-09-05 DIAGNOSIS — G3184 Mild cognitive impairment, so stated: Secondary | ICD-10-CM | POA: Diagnosis not present

## 2023-09-05 DIAGNOSIS — Z79899 Other long term (current) drug therapy: Secondary | ICD-10-CM | POA: Diagnosis not present

## 2023-09-12 ENCOUNTER — Telehealth: Payer: Self-pay | Admitting: Cardiovascular Disease

## 2023-09-12 NOTE — Telephone Encounter (Signed)
Call to wife to discuss medication and which pharmacy is it to be sent

## 2023-09-12 NOTE — Telephone Encounter (Signed)
Aissatou with [facility] is following up. She states the they will need feedback as well because they administer the patient's medication.

## 2023-09-12 NOTE — Telephone Encounter (Signed)
Pt c/o medication issue:  1. Name of Medication: Enalapril  2. How are you currently taking this medication (dosage and times per day)?   3. Are you having a reaction (difficulty breathing--STAT)?   4. What is your medication issue? Patient's wife called and wanted to know if patient is supposed to be taking this medicine. She said if he does, he will need it to be called in pleasei

## 2023-09-12 NOTE — Telephone Encounter (Signed)
Enalapril is listed in OV notes that patient is taking as 10 mg daily, but is not listed on his med list.  Is patient to be taking this medication and if so, OK to send in?

## 2023-09-13 MED ORDER — ENALAPRIL MALEATE 10 MG PO TABS: 10.0000 mg | ORAL_TABLET | Freq: Every day | ORAL | 3 refills | Status: DC

## 2023-09-13 NOTE — Telephone Encounter (Signed)
LM to call office to verify pharmacy so that medication can be sent.  Cal to Facility and advised patient to continue medications.  She states patient wife at facility.  Spoke with wife and verified continuation of medication and [facility]  [facility] sent

## 2023-09-20 DIAGNOSIS — I130 Hypertensive heart and chronic kidney disease with heart failure and stage 1 through stage 4 chronic kidney disease, or unspecified chronic kidney disease: Secondary | ICD-10-CM | POA: Diagnosis not present

## 2023-09-20 DIAGNOSIS — E785 Hyperlipidemia, unspecified: Secondary | ICD-10-CM | POA: Diagnosis not present

## 2023-09-20 DIAGNOSIS — H353130 Nonexudative age-related macular degeneration, bilateral, stage unspecified: Secondary | ICD-10-CM | POA: Diagnosis not present

## 2023-09-20 DIAGNOSIS — J309 Allergic rhinitis, unspecified: Secondary | ICD-10-CM | POA: Diagnosis not present

## 2023-09-20 DIAGNOSIS — M159 Polyosteoarthritis, unspecified: Secondary | ICD-10-CM | POA: Diagnosis not present

## 2023-09-20 DIAGNOSIS — K5900 Constipation, unspecified: Secondary | ICD-10-CM | POA: Diagnosis not present

## 2023-09-20 DIAGNOSIS — R42 Dizziness and giddiness: Secondary | ICD-10-CM | POA: Diagnosis not present

## 2023-09-20 DIAGNOSIS — N401 Enlarged prostate with lower urinary tract symptoms: Secondary | ICD-10-CM | POA: Diagnosis not present

## 2023-09-20 DIAGNOSIS — R7303 Prediabetes: Secondary | ICD-10-CM | POA: Diagnosis not present

## 2023-09-28 DIAGNOSIS — H353231 Exudative age-related macular degeneration, bilateral, with active choroidal neovascularization: Secondary | ICD-10-CM | POA: Diagnosis not present

## 2023-09-29 DIAGNOSIS — N312 Flaccid neuropathic bladder, not elsewhere classified: Secondary | ICD-10-CM | POA: Diagnosis not present

## 2023-09-29 DIAGNOSIS — C642 Malignant neoplasm of left kidney, except renal pelvis: Secondary | ICD-10-CM | POA: Diagnosis not present

## 2023-10-05 ENCOUNTER — Telehealth: Payer: Self-pay | Admitting: Cardiovascular Disease

## 2023-10-05 MED ORDER — NITROGLYCERIN 0.4 MG SL SUBL: 0.4000 mg | SUBLINGUAL_TABLET | SUBLINGUAL | 3 refills | Status: DC | PRN

## 2023-10-05 NOTE — Telephone Encounter (Signed)
Patient came in to get medication refill

## 2023-10-05 NOTE — Telephone Encounter (Signed)
Pt's medication was sent to pt's pharmacy as requested. Confirmation received.  °

## 2023-10-05 NOTE — Telephone Encounter (Signed)
Spoke to [employer] -RN will be refilling pt medication. 10/05/2023

## 2023-10-05 NOTE — Telephone Encounter (Signed)
Pt walked into office today with his wife (for her appointment). Pt requested a refill of nitroglycerin. Pt has been seen in the last year and medication in on his pt's med list. Refill sent in.

## 2023-10-05 NOTE — Addendum Note (Signed)
Addended by: Bernita Buffy on: 10/05/2023 02:29 PM   Modules accepted: Orders

## 2023-10-19 DIAGNOSIS — M9901 Segmental and somatic dysfunction of cervical region: Secondary | ICD-10-CM | POA: Diagnosis not present

## 2023-10-19 DIAGNOSIS — M5411 Radiculopathy, occipito-atlanto-axial region: Secondary | ICD-10-CM | POA: Diagnosis not present

## 2023-10-19 DIAGNOSIS — M9903 Segmental and somatic dysfunction of lumbar region: Secondary | ICD-10-CM | POA: Diagnosis not present

## 2023-10-19 DIAGNOSIS — G43101 Migraine with aura, not intractable, with status migrainosus: Secondary | ICD-10-CM | POA: Diagnosis not present

## 2023-10-19 DIAGNOSIS — M62830 Muscle spasm of back: Secondary | ICD-10-CM | POA: Diagnosis not present

## 2023-10-19 DIAGNOSIS — M9902 Segmental and somatic dysfunction of thoracic region: Secondary | ICD-10-CM | POA: Diagnosis not present

## 2023-10-19 DIAGNOSIS — R9431 Abnormal electrocardiogram [ECG] [EKG]: Secondary | ICD-10-CM | POA: Diagnosis not present

## 2023-10-19 DIAGNOSIS — R42 Dizziness and giddiness: Secondary | ICD-10-CM | POA: Diagnosis not present

## 2023-10-26 DIAGNOSIS — Z961 Presence of intraocular lens: Secondary | ICD-10-CM | POA: Diagnosis not present

## 2023-10-26 DIAGNOSIS — H353211 Exudative age-related macular degeneration, right eye, with active choroidal neovascularization: Secondary | ICD-10-CM | POA: Diagnosis not present

## 2023-10-26 DIAGNOSIS — H35371 Puckering of macula, right eye: Secondary | ICD-10-CM | POA: Diagnosis not present

## 2023-10-26 DIAGNOSIS — H353231 Exudative age-related macular degeneration, bilateral, with active choroidal neovascularization: Secondary | ICD-10-CM | POA: Diagnosis not present

## 2023-10-30 DIAGNOSIS — E785 Hyperlipidemia, unspecified: Secondary | ICD-10-CM | POA: Diagnosis not present

## 2023-10-30 DIAGNOSIS — G3184 Mild cognitive impairment, so stated: Secondary | ICD-10-CM | POA: Diagnosis not present

## 2023-10-30 DIAGNOSIS — Z79899 Other long term (current) drug therapy: Secondary | ICD-10-CM | POA: Diagnosis not present

## 2023-10-30 DIAGNOSIS — R441 Visual hallucinations: Secondary | ICD-10-CM | POA: Diagnosis not present

## 2023-10-30 DIAGNOSIS — I10 Essential (primary) hypertension: Secondary | ICD-10-CM | POA: Diagnosis not present

## 2023-10-30 DIAGNOSIS — F331 Major depressive disorder, recurrent, moderate: Secondary | ICD-10-CM | POA: Diagnosis not present

## 2023-10-31 DIAGNOSIS — R7303 Prediabetes: Secondary | ICD-10-CM | POA: Diagnosis not present

## 2023-10-31 DIAGNOSIS — E785 Hyperlipidemia, unspecified: Secondary | ICD-10-CM | POA: Diagnosis not present

## 2023-10-31 DIAGNOSIS — J309 Allergic rhinitis, unspecified: Secondary | ICD-10-CM | POA: Diagnosis not present

## 2023-10-31 DIAGNOSIS — R42 Dizziness and giddiness: Secondary | ICD-10-CM | POA: Diagnosis not present

## 2023-10-31 DIAGNOSIS — M25522 Pain in left elbow: Secondary | ICD-10-CM | POA: Diagnosis not present

## 2023-10-31 DIAGNOSIS — I130 Hypertensive heart and chronic kidney disease with heart failure and stage 1 through stage 4 chronic kidney disease, or unspecified chronic kidney disease: Secondary | ICD-10-CM | POA: Diagnosis not present

## 2023-10-31 DIAGNOSIS — N401 Enlarged prostate with lower urinary tract symptoms: Secondary | ICD-10-CM | POA: Diagnosis not present

## 2023-10-31 DIAGNOSIS — K5900 Constipation, unspecified: Secondary | ICD-10-CM | POA: Diagnosis not present

## 2023-10-31 DIAGNOSIS — H353130 Nonexudative age-related macular degeneration, bilateral, stage unspecified: Secondary | ICD-10-CM | POA: Diagnosis not present

## 2023-10-31 DIAGNOSIS — M159 Polyosteoarthritis, unspecified: Secondary | ICD-10-CM | POA: Diagnosis not present

## 2023-11-17 DIAGNOSIS — L84 Corns and callosities: Secondary | ICD-10-CM | POA: Diagnosis not present

## 2023-11-17 DIAGNOSIS — B351 Tinea unguium: Secondary | ICD-10-CM | POA: Diagnosis not present

## 2023-11-17 DIAGNOSIS — G609 Hereditary and idiopathic neuropathy, unspecified: Secondary | ICD-10-CM | POA: Diagnosis not present

## 2023-11-17 DIAGNOSIS — M2041 Other hammer toe(s) (acquired), right foot: Secondary | ICD-10-CM | POA: Diagnosis not present

## 2023-11-17 DIAGNOSIS — M79675 Pain in left toe(s): Secondary | ICD-10-CM | POA: Diagnosis not present

## 2023-11-20 DIAGNOSIS — H353130 Nonexudative age-related macular degeneration, bilateral, stage unspecified: Secondary | ICD-10-CM | POA: Diagnosis not present

## 2023-11-20 DIAGNOSIS — E785 Hyperlipidemia, unspecified: Secondary | ICD-10-CM | POA: Diagnosis not present

## 2023-11-20 DIAGNOSIS — I130 Hypertensive heart and chronic kidney disease with heart failure and stage 1 through stage 4 chronic kidney disease, or unspecified chronic kidney disease: Secondary | ICD-10-CM | POA: Diagnosis not present

## 2023-11-20 DIAGNOSIS — M159 Polyosteoarthritis, unspecified: Secondary | ICD-10-CM | POA: Diagnosis not present

## 2023-11-20 DIAGNOSIS — G9009 Other idiopathic peripheral autonomic neuropathy: Secondary | ICD-10-CM | POA: Diagnosis not present

## 2023-11-20 DIAGNOSIS — R7303 Prediabetes: Secondary | ICD-10-CM | POA: Diagnosis not present

## 2023-11-20 DIAGNOSIS — M25522 Pain in left elbow: Secondary | ICD-10-CM | POA: Diagnosis not present

## 2023-11-20 DIAGNOSIS — K5900 Constipation, unspecified: Secondary | ICD-10-CM | POA: Diagnosis not present

## 2023-11-20 DIAGNOSIS — J309 Allergic rhinitis, unspecified: Secondary | ICD-10-CM | POA: Diagnosis not present

## 2023-11-20 DIAGNOSIS — R42 Dizziness and giddiness: Secondary | ICD-10-CM | POA: Diagnosis not present

## 2023-11-24 DIAGNOSIS — I130 Hypertensive heart and chronic kidney disease with heart failure and stage 1 through stage 4 chronic kidney disease, or unspecified chronic kidney disease: Secondary | ICD-10-CM | POA: Diagnosis not present

## 2023-11-24 DIAGNOSIS — E785 Hyperlipidemia, unspecified: Secondary | ICD-10-CM | POA: Diagnosis not present

## 2023-11-24 DIAGNOSIS — K5900 Constipation, unspecified: Secondary | ICD-10-CM | POA: Diagnosis not present

## 2023-11-24 DIAGNOSIS — M159 Polyosteoarthritis, unspecified: Secondary | ICD-10-CM | POA: Diagnosis not present

## 2023-11-27 DIAGNOSIS — R441 Visual hallucinations: Secondary | ICD-10-CM | POA: Diagnosis not present

## 2023-11-27 DIAGNOSIS — Z79899 Other long term (current) drug therapy: Secondary | ICD-10-CM | POA: Diagnosis not present

## 2023-11-27 DIAGNOSIS — F331 Major depressive disorder, recurrent, moderate: Secondary | ICD-10-CM | POA: Diagnosis not present

## 2023-11-27 DIAGNOSIS — G3184 Mild cognitive impairment, so stated: Secondary | ICD-10-CM | POA: Diagnosis not present

## 2023-11-30 DIAGNOSIS — I130 Hypertensive heart and chronic kidney disease with heart failure and stage 1 through stage 4 chronic kidney disease, or unspecified chronic kidney disease: Secondary | ICD-10-CM | POA: Diagnosis not present

## 2023-11-30 DIAGNOSIS — R419 Unspecified symptoms and signs involving cognitive functions and awareness: Secondary | ICD-10-CM | POA: Diagnosis not present

## 2023-11-30 DIAGNOSIS — E785 Hyperlipidemia, unspecified: Secondary | ICD-10-CM | POA: Diagnosis not present

## 2023-11-30 DIAGNOSIS — R7303 Prediabetes: Secondary | ICD-10-CM | POA: Diagnosis not present

## 2023-12-05 DIAGNOSIS — I10 Essential (primary) hypertension: Secondary | ICD-10-CM | POA: Diagnosis not present

## 2023-12-05 DIAGNOSIS — E785 Hyperlipidemia, unspecified: Secondary | ICD-10-CM | POA: Diagnosis not present

## 2023-12-12 DIAGNOSIS — R2689 Other abnormalities of gait and mobility: Secondary | ICD-10-CM | POA: Diagnosis not present

## 2023-12-12 DIAGNOSIS — M159 Polyosteoarthritis, unspecified: Secondary | ICD-10-CM | POA: Diagnosis not present

## 2023-12-12 DIAGNOSIS — M62552 Muscle wasting and atrophy, not elsewhere classified, left thigh: Secondary | ICD-10-CM | POA: Diagnosis not present

## 2023-12-12 DIAGNOSIS — M62562 Muscle wasting and atrophy, not elsewhere classified, left lower leg: Secondary | ICD-10-CM | POA: Diagnosis not present

## 2023-12-12 DIAGNOSIS — M62551 Muscle wasting and atrophy, not elsewhere classified, right thigh: Secondary | ICD-10-CM | POA: Diagnosis not present

## 2023-12-12 DIAGNOSIS — R2681 Unsteadiness on feet: Secondary | ICD-10-CM | POA: Diagnosis not present

## 2023-12-12 DIAGNOSIS — M62561 Muscle wasting and atrophy, not elsewhere classified, right lower leg: Secondary | ICD-10-CM | POA: Diagnosis not present

## 2023-12-14 DIAGNOSIS — R2681 Unsteadiness on feet: Secondary | ICD-10-CM | POA: Diagnosis not present

## 2023-12-14 DIAGNOSIS — M159 Polyosteoarthritis, unspecified: Secondary | ICD-10-CM | POA: Diagnosis not present

## 2023-12-14 DIAGNOSIS — M62561 Muscle wasting and atrophy, not elsewhere classified, right lower leg: Secondary | ICD-10-CM | POA: Diagnosis not present

## 2023-12-14 DIAGNOSIS — M62551 Muscle wasting and atrophy, not elsewhere classified, right thigh: Secondary | ICD-10-CM | POA: Diagnosis not present

## 2023-12-14 DIAGNOSIS — M62562 Muscle wasting and atrophy, not elsewhere classified, left lower leg: Secondary | ICD-10-CM | POA: Diagnosis not present

## 2023-12-14 DIAGNOSIS — M62552 Muscle wasting and atrophy, not elsewhere classified, left thigh: Secondary | ICD-10-CM | POA: Diagnosis not present

## 2023-12-15 DIAGNOSIS — M62561 Muscle wasting and atrophy, not elsewhere classified, right lower leg: Secondary | ICD-10-CM | POA: Diagnosis not present

## 2023-12-15 DIAGNOSIS — M62552 Muscle wasting and atrophy, not elsewhere classified, left thigh: Secondary | ICD-10-CM | POA: Diagnosis not present

## 2023-12-15 DIAGNOSIS — R2681 Unsteadiness on feet: Secondary | ICD-10-CM | POA: Diagnosis not present

## 2023-12-15 DIAGNOSIS — M62562 Muscle wasting and atrophy, not elsewhere classified, left lower leg: Secondary | ICD-10-CM | POA: Diagnosis not present

## 2023-12-15 DIAGNOSIS — M159 Polyosteoarthritis, unspecified: Secondary | ICD-10-CM | POA: Diagnosis not present

## 2023-12-15 DIAGNOSIS — M62551 Muscle wasting and atrophy, not elsewhere classified, right thigh: Secondary | ICD-10-CM | POA: Diagnosis not present

## 2023-12-18 DIAGNOSIS — M62561 Muscle wasting and atrophy, not elsewhere classified, right lower leg: Secondary | ICD-10-CM | POA: Diagnosis not present

## 2023-12-18 DIAGNOSIS — M159 Polyosteoarthritis, unspecified: Secondary | ICD-10-CM | POA: Diagnosis not present

## 2023-12-18 DIAGNOSIS — M62551 Muscle wasting and atrophy, not elsewhere classified, right thigh: Secondary | ICD-10-CM | POA: Diagnosis not present

## 2023-12-18 DIAGNOSIS — G3184 Mild cognitive impairment, so stated: Secondary | ICD-10-CM | POA: Diagnosis not present

## 2023-12-18 DIAGNOSIS — R441 Visual hallucinations: Secondary | ICD-10-CM | POA: Diagnosis not present

## 2023-12-18 DIAGNOSIS — R2681 Unsteadiness on feet: Secondary | ICD-10-CM | POA: Diagnosis not present

## 2023-12-18 DIAGNOSIS — M62562 Muscle wasting and atrophy, not elsewhere classified, left lower leg: Secondary | ICD-10-CM | POA: Diagnosis not present

## 2023-12-18 DIAGNOSIS — M62552 Muscle wasting and atrophy, not elsewhere classified, left thigh: Secondary | ICD-10-CM | POA: Diagnosis not present

## 2023-12-18 DIAGNOSIS — F331 Major depressive disorder, recurrent, moderate: Secondary | ICD-10-CM | POA: Diagnosis not present

## 2023-12-18 DIAGNOSIS — F411 Generalized anxiety disorder: Secondary | ICD-10-CM | POA: Diagnosis not present

## 2023-12-20 DIAGNOSIS — M62562 Muscle wasting and atrophy, not elsewhere classified, left lower leg: Secondary | ICD-10-CM | POA: Diagnosis not present

## 2023-12-20 DIAGNOSIS — R2681 Unsteadiness on feet: Secondary | ICD-10-CM | POA: Diagnosis not present

## 2023-12-20 DIAGNOSIS — M62561 Muscle wasting and atrophy, not elsewhere classified, right lower leg: Secondary | ICD-10-CM | POA: Diagnosis not present

## 2023-12-20 DIAGNOSIS — M159 Polyosteoarthritis, unspecified: Secondary | ICD-10-CM | POA: Diagnosis not present

## 2023-12-20 DIAGNOSIS — M62552 Muscle wasting and atrophy, not elsewhere classified, left thigh: Secondary | ICD-10-CM | POA: Diagnosis not present

## 2023-12-20 DIAGNOSIS — M62551 Muscle wasting and atrophy, not elsewhere classified, right thigh: Secondary | ICD-10-CM | POA: Diagnosis not present

## 2023-12-21 DIAGNOSIS — H353231 Exudative age-related macular degeneration, bilateral, with active choroidal neovascularization: Secondary | ICD-10-CM | POA: Diagnosis not present

## 2023-12-21 DIAGNOSIS — H9190 Unspecified hearing loss, unspecified ear: Secondary | ICD-10-CM | POA: Diagnosis not present

## 2023-12-21 DIAGNOSIS — I252 Old myocardial infarction: Secondary | ICD-10-CM | POA: Diagnosis not present

## 2023-12-22 ENCOUNTER — Other Ambulatory Visit: Payer: Self-pay | Admitting: Cardiovascular Disease

## 2023-12-22 DIAGNOSIS — M62551 Muscle wasting and atrophy, not elsewhere classified, right thigh: Secondary | ICD-10-CM | POA: Diagnosis not present

## 2023-12-22 DIAGNOSIS — M62552 Muscle wasting and atrophy, not elsewhere classified, left thigh: Secondary | ICD-10-CM | POA: Diagnosis not present

## 2023-12-22 DIAGNOSIS — R2681 Unsteadiness on feet: Secondary | ICD-10-CM | POA: Diagnosis not present

## 2023-12-22 DIAGNOSIS — M62561 Muscle wasting and atrophy, not elsewhere classified, right lower leg: Secondary | ICD-10-CM | POA: Diagnosis not present

## 2023-12-22 DIAGNOSIS — M159 Polyosteoarthritis, unspecified: Secondary | ICD-10-CM | POA: Diagnosis not present

## 2023-12-22 DIAGNOSIS — M62562 Muscle wasting and atrophy, not elsewhere classified, left lower leg: Secondary | ICD-10-CM | POA: Diagnosis not present

## 2023-12-25 DIAGNOSIS — M62562 Muscle wasting and atrophy, not elsewhere classified, left lower leg: Secondary | ICD-10-CM | POA: Diagnosis not present

## 2023-12-25 DIAGNOSIS — M62561 Muscle wasting and atrophy, not elsewhere classified, right lower leg: Secondary | ICD-10-CM | POA: Diagnosis not present

## 2023-12-25 DIAGNOSIS — M159 Polyosteoarthritis, unspecified: Secondary | ICD-10-CM | POA: Diagnosis not present

## 2023-12-25 DIAGNOSIS — M62551 Muscle wasting and atrophy, not elsewhere classified, right thigh: Secondary | ICD-10-CM | POA: Diagnosis not present

## 2023-12-25 DIAGNOSIS — R2681 Unsteadiness on feet: Secondary | ICD-10-CM | POA: Diagnosis not present

## 2023-12-25 DIAGNOSIS — M62552 Muscle wasting and atrophy, not elsewhere classified, left thigh: Secondary | ICD-10-CM | POA: Diagnosis not present

## 2023-12-27 DIAGNOSIS — M159 Polyosteoarthritis, unspecified: Secondary | ICD-10-CM | POA: Diagnosis not present

## 2023-12-27 DIAGNOSIS — M62551 Muscle wasting and atrophy, not elsewhere classified, right thigh: Secondary | ICD-10-CM | POA: Diagnosis not present

## 2023-12-27 DIAGNOSIS — R2681 Unsteadiness on feet: Secondary | ICD-10-CM | POA: Diagnosis not present

## 2023-12-27 DIAGNOSIS — M62561 Muscle wasting and atrophy, not elsewhere classified, right lower leg: Secondary | ICD-10-CM | POA: Diagnosis not present

## 2023-12-27 DIAGNOSIS — M62562 Muscle wasting and atrophy, not elsewhere classified, left lower leg: Secondary | ICD-10-CM | POA: Diagnosis not present

## 2023-12-27 DIAGNOSIS — M62552 Muscle wasting and atrophy, not elsewhere classified, left thigh: Secondary | ICD-10-CM | POA: Diagnosis not present

## 2023-12-29 DIAGNOSIS — M159 Polyosteoarthritis, unspecified: Secondary | ICD-10-CM | POA: Diagnosis not present

## 2023-12-29 DIAGNOSIS — R2681 Unsteadiness on feet: Secondary | ICD-10-CM | POA: Diagnosis not present

## 2023-12-29 DIAGNOSIS — M62551 Muscle wasting and atrophy, not elsewhere classified, right thigh: Secondary | ICD-10-CM | POA: Diagnosis not present

## 2023-12-29 DIAGNOSIS — M62561 Muscle wasting and atrophy, not elsewhere classified, right lower leg: Secondary | ICD-10-CM | POA: Diagnosis not present

## 2023-12-29 DIAGNOSIS — M62562 Muscle wasting and atrophy, not elsewhere classified, left lower leg: Secondary | ICD-10-CM | POA: Diagnosis not present

## 2023-12-29 DIAGNOSIS — M62552 Muscle wasting and atrophy, not elsewhere classified, left thigh: Secondary | ICD-10-CM | POA: Diagnosis not present

## 2024-01-01 DIAGNOSIS — M62551 Muscle wasting and atrophy, not elsewhere classified, right thigh: Secondary | ICD-10-CM | POA: Diagnosis not present

## 2024-01-01 DIAGNOSIS — M62562 Muscle wasting and atrophy, not elsewhere classified, left lower leg: Secondary | ICD-10-CM | POA: Diagnosis not present

## 2024-01-01 DIAGNOSIS — M159 Polyosteoarthritis, unspecified: Secondary | ICD-10-CM | POA: Diagnosis not present

## 2024-01-01 DIAGNOSIS — M62552 Muscle wasting and atrophy, not elsewhere classified, left thigh: Secondary | ICD-10-CM | POA: Diagnosis not present

## 2024-01-01 DIAGNOSIS — R2681 Unsteadiness on feet: Secondary | ICD-10-CM | POA: Diagnosis not present

## 2024-01-01 DIAGNOSIS — M62561 Muscle wasting and atrophy, not elsewhere classified, right lower leg: Secondary | ICD-10-CM | POA: Diagnosis not present

## 2024-01-03 DIAGNOSIS — M62551 Muscle wasting and atrophy, not elsewhere classified, right thigh: Secondary | ICD-10-CM | POA: Diagnosis not present

## 2024-01-03 DIAGNOSIS — M62561 Muscle wasting and atrophy, not elsewhere classified, right lower leg: Secondary | ICD-10-CM | POA: Diagnosis not present

## 2024-01-03 DIAGNOSIS — M159 Polyosteoarthritis, unspecified: Secondary | ICD-10-CM | POA: Diagnosis not present

## 2024-01-03 DIAGNOSIS — R2681 Unsteadiness on feet: Secondary | ICD-10-CM | POA: Diagnosis not present

## 2024-01-03 DIAGNOSIS — M62552 Muscle wasting and atrophy, not elsewhere classified, left thigh: Secondary | ICD-10-CM | POA: Diagnosis not present

## 2024-01-03 DIAGNOSIS — M62562 Muscle wasting and atrophy, not elsewhere classified, left lower leg: Secondary | ICD-10-CM | POA: Diagnosis not present

## 2024-01-04 DIAGNOSIS — M159 Polyosteoarthritis, unspecified: Secondary | ICD-10-CM | POA: Diagnosis not present

## 2024-01-04 DIAGNOSIS — R2681 Unsteadiness on feet: Secondary | ICD-10-CM | POA: Diagnosis not present

## 2024-01-04 DIAGNOSIS — M62551 Muscle wasting and atrophy, not elsewhere classified, right thigh: Secondary | ICD-10-CM | POA: Diagnosis not present

## 2024-01-04 DIAGNOSIS — M62561 Muscle wasting and atrophy, not elsewhere classified, right lower leg: Secondary | ICD-10-CM | POA: Diagnosis not present

## 2024-01-04 DIAGNOSIS — M62562 Muscle wasting and atrophy, not elsewhere classified, left lower leg: Secondary | ICD-10-CM | POA: Diagnosis not present

## 2024-01-04 DIAGNOSIS — M62552 Muscle wasting and atrophy, not elsewhere classified, left thigh: Secondary | ICD-10-CM | POA: Diagnosis not present

## 2024-01-05 ENCOUNTER — Encounter: Payer: Self-pay | Admitting: Cardiovascular Disease

## 2024-01-05 ENCOUNTER — Ambulatory Visit: Payer: Medicare Other | Attending: Cardiovascular Disease | Admitting: Cardiovascular Disease

## 2024-01-05 VITALS — BP 96/60 | HR 94 | Ht 67.0 in | Wt 142.0 lb

## 2024-01-05 DIAGNOSIS — Z9861 Coronary angioplasty status: Secondary | ICD-10-CM

## 2024-01-05 DIAGNOSIS — I2510 Atherosclerotic heart disease of native coronary artery without angina pectoris: Secondary | ICD-10-CM

## 2024-01-05 DIAGNOSIS — I10 Essential (primary) hypertension: Secondary | ICD-10-CM | POA: Diagnosis not present

## 2024-01-05 DIAGNOSIS — E785 Hyperlipidemia, unspecified: Secondary | ICD-10-CM

## 2024-01-05 DIAGNOSIS — J309 Allergic rhinitis, unspecified: Secondary | ICD-10-CM | POA: Diagnosis not present

## 2024-01-05 DIAGNOSIS — R7303 Prediabetes: Secondary | ICD-10-CM | POA: Diagnosis not present

## 2024-01-05 DIAGNOSIS — M6281 Muscle weakness (generalized): Secondary | ICD-10-CM | POA: Diagnosis not present

## 2024-01-05 DIAGNOSIS — H353130 Nonexudative age-related macular degeneration, bilateral, stage unspecified: Secondary | ICD-10-CM | POA: Diagnosis not present

## 2024-01-05 DIAGNOSIS — R42 Dizziness and giddiness: Secondary | ICD-10-CM | POA: Diagnosis not present

## 2024-01-05 DIAGNOSIS — M159 Polyosteoarthritis, unspecified: Secondary | ICD-10-CM | POA: Diagnosis not present

## 2024-01-05 DIAGNOSIS — K5900 Constipation, unspecified: Secondary | ICD-10-CM | POA: Diagnosis not present

## 2024-01-05 DIAGNOSIS — I130 Hypertensive heart and chronic kidney disease with heart failure and stage 1 through stage 4 chronic kidney disease, or unspecified chronic kidney disease: Secondary | ICD-10-CM | POA: Diagnosis not present

## 2024-01-05 MED ORDER — AMLODIPINE BESYLATE 2.5 MG PO TABS: 2.5000 mg | ORAL_TABLET | Freq: Every day | ORAL | 3 refills | Status: DC

## 2024-01-05 MED ORDER — ENALAPRIL MALEATE 5 MG PO TABS: 5.0000 mg | ORAL_TABLET | Freq: Every day | ORAL | 3 refills | Status: DC

## 2024-01-05 NOTE — Patient Instructions (Signed)
Medication Instructions:   DECREASE VERAPAMIL TO 5 MG ONCE DAILY= 1/2 OF THE 10 MG TABLET ONCE DAILY  DECREASE AMLODIPINE TO 2.5 MG ONCE DAILY= 1/2 OF THE 5 MG TABLET ONCE DAILY  *If you need a refill on your cardiac medications before your next appointment, please call your pharmacy*   Follow-Up: At [facility], you and your health needs are our priority.  As part of our continuing mission to provide you with exceptional heart care, we have created designated Provider Care Teams.  These Care Teams include your primary Cardiologist (physician) and Advanced Practice Providers (APPs -  Physician Assistants and Nurse Practitioners) who all work together to provide you with the care you need, when you need it.  We recommend signing up for the patient portal called "MyChart".  Sign up information is provided on this After Visit Summary.  MyChart is used to connect with patients for Virtual Visits (Telemedicine).  Patients are able to view lab/test results, encounter notes, upcoming appointments, etc.  Non-urgent messages can be sent to your provider as well.   To learn more about what you can do with MyChart, go to [email or web address].    Your next appointment:   3 month(s)  Provider:   ANY APP  Your physician wants you to follow-up in: 1 YEAR WITH DR Allyson Sabal You will receive a reminder letter in the mail two months in advance. If you don't receive a letter, please call our office to schedule the follow-up appointment.

## 2024-01-05 NOTE — Progress Notes (Signed)
01/05/2024 Donald Todd   [birth year]  [id number]  Primary Physician Daisy Floro, MD Primary Cardiologist: Runell Gess MD FACP, [place], [employer], [place]  HPI:  Donald Todd is a 89 y.o.   thin-appearing married Caucasian male, father of 5, grandfather to 7 grandchildren, who I last  saw in the office 12/30/2022.  He has a history of RCA and ramus branch stenting by myself back in 1997. I catheterized him, December 22, 2003, revealing patent stent with a 60% mid LAD lesion, normal LV function. He did have a 40% ostial right renal artery stenosis with a known left nephrectomy. His last Myoview, performed August 04, 2010, revealed lateral scar, and catheterization performed several weeks prior to that revealed patent stents with noncritical CAD. He has remained asymptomatic. His last .Myoview stress test performed 02/15/13 moderate sized lateral wall scar consistent with his anatomy. He saw Corine Shelter in the office 04/03/17 after a syncopal episode that occurred while he was bending over and running up. He's had a workup including an MRI of his brain which is unremarkable. He did injure his left shoulder and had this surgically addressed.   Because of some atypical chest pain I perform Myoview stress testing on him 05/07/2020 that showed lateral scar without ischemia unchanged from prior Myoview stress test performed in 2014.[name]  His pain is somewhat atypical.   Unfortunately, he was diagnosed with cancer again.  He originally was diagnosed with left renal cell cancer in early 2000's status post left nephrectomy in 2005.  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  He has had radiation therapy.   Since I saw him a year ago he is remained stable.  He and his wife have moved into his assisted care facility.  He walks with a walker because of instability.  He denies chest pain or shortness of breath.   Current Meds  Medication Sig    aspirin 81 MG tablet Take 81 mg by mouth at bedtime.   atorvastatin (LIPITOR) 40 MG tablet TAKE 1 TABLET AT BEDTIME   docusate sodium (COLACE) 100 MG capsule Take 1 capsule (100 mg total) by mouth every 12 (twelve) hours. (Patient taking differently: Take 200 mg by mouth daily.)   loratadine (CLARITIN) 10 MG tablet Take 10 mg by mouth daily.   meclizine (ANTIVERT) 25 MG tablet Take 25 mg by mouth as needed for dizziness.   Multiple Vitamins-Minerals (PRESERVISION AREDS 2) CAPS Take 1 capsule by mouth 2 (two) times daily.   nitroGLYCERIN (NITROSTAT) 0.4 MG SL tablet Place 1 tablet (0.4 mg total) under the tongue as needed.   polyethylene glycol (MIRALAX / GLYCOLAX) 17 g packet Take 17 g by mouth daily.   tamsulosin (FLOMAX) 0.4 MG CAPS capsule Take 1-2 capsules (0.4-0.8 mg total) by mouth daily after supper.   [DISCONTINUED] amLODipine (NORVASC) 5 MG tablet Take 1 tablet (5 mg total) by mouth daily.   [DISCONTINUED] enalapril (VASOTEC) 10 MG tablet Take 1 tablet (10 mg total) by mouth daily.     Allergies  Allergen Reactions   Tape Hives    Surgical tape - causes blisters   Hydrocodone     Shaking uncontrollably    Iohexol      Desc: PT DOES RECALL REACTION JUST SAYS IT WAS A LONG TIME AGO FOR KIDNEY X-RAYS AND HE DIDN'T TOLERATE IT WELL-ARS 05/02/09-NOTE E-CHART STATES A HOT FEELING IS  HIS REACTION, BUT HE CAN'T CONFIRM THAT WAS ALL    Oxycodone Other (See Comments)    Shaking uncontrollably     Social History   Socioeconomic History   Marital status: Married    Spouse name: Not on file   Number of children: 5   Years of education: 12   Highest education level: Not on file  Occupational History   Occupation: Retired    Comment: Librarian, academic, Presenter, 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  Tobacco Use   Smoking status: Former    Current packs/day: 0.00    Average packs/day: 0.3 packs/day for 13.0 years (3.3 ttl pk-yrs)    Types: Cigarettes, Cigars    Start date: 12/13/1950    Quit date: 12/14/1963    Years since  quitting: 60.1   Smokeless tobacco: Never  Vaping Use   Vaping status: Never Used  Substance and Sexual Activity   Alcohol use: No   Drug use: No   Sexual activity: Not Currently  Other Topics Concern   Not on file  Social History Narrative   Lives at home w/ his wife   Right-handed   Caffeine: 2 cups of coffee per day, rare soft drink   Social Drivers of Corporate investment banker Strain: Not on file  Food Insecurity: No Food Insecurity (02/03/2023)   Hunger Vital Sign    Worried About Running Out of Food in the Last Year: Never true    Ran Out of Food in the Last Year: Never true  Transportation Needs: No Transportation Needs (02/03/2023)   PRAPARE - Administrator, Civil Service (Medical): No    Lack of Transportation (Non-Medical): No  Physical Activity: Not on file  Stress: Not on file  Social Connections: Not on file  Intimate Partner Violence: Not At Risk (02/03/2023)   Humiliation, Afraid, Rape, and Kick questionnaire    Fear of Current or Ex-Partner: No    Emotionally Abused: No    Physically Abused: No    Sexually Abused: No     Review of Systems: General: negative for chills, fever, night sweats or weight changes.  Cardiovascular: negative for chest pain, dyspnea on exertion, edema, orthopnea, palpitations, paroxysmal nocturnal dyspnea or shortness of breath Dermatological: negative for rash Respiratory: negative for cough or wheezing Urologic: negative for hematuria Abdominal: negative for nausea, vomiting, diarrhea, bright red blood per rectum, melena, or hematemesis Neurologic: negative for visual changes, syncope, or dizziness All other systems reviewed and are otherwise negative except as noted above.    Blood pressure 96/60, pulse 94, height 5\' 7"  (1.702 m), weight 142 lb (64.4 kg), SpO2 97%.  General appearance: alert and no distress Neck: no adenopathy, no carotid bruit, no JVD, supple, symmetrical, trachea midline, and thyroid not enlarged,  symmetric, no tenderness/mass/nodules Lungs: clear to auscultation bilaterally Heart: regular rate and rhythm, S1, S2 normal, no murmur, click, rub or gallop Extremities: extremities normal, atraumatic, no cyanosis or edema Pulses: 2+ and symmetric Skin: Skin color, texture, turgor normal. No rashes or lesions Neurologic: Grossly normal  EKG EKG Interpretation Date/Time:  Friday January 05 2024 12:04:36 EST Ventricular Rate:  94 PR Interval:    QRS Duration:  86 QT Interval:  332 QTC Calculation: 415 R Axis:   -72  Text Interpretation: Atrial flutter with variable A-V block Left axis deviation Minimal voltage criteria for LVH, may be normal variant ( R in aVL ) Inferior infarct , age undetermined Anterior infarct , age undetermined T wave abnormality, consider lateral ischemia When compared  with ECG of 05-Jun-2023 06:57, PREVIOUS ECG IS PRESENT Confirmed by Nanetta Batty [phone]) on 01/05/2024 12:13:06 PM    ASSESSMENT AND PLAN:   Essential hypertension History of essential hypertension her blood pressure measured today at 96/50.  He is on amlodipine and enalapril which I am going to down titrate.  He does complain of some dizziness.  CAD S/P percutaneous coronary angioplasty History of CAD status post RCA and ramus branch intervention by myself back in 1997.  I catheterized him 12/22/2003 revealing patent stents with 60% mid LAD lesion and normal LV function.  I catheterized him again 08/04/2010 revealing patent stents with noncritical CAD.  His last Myoview performed 6/33/21 showed lateral scar without ischemia unchanged from prior stress test.  He is asymptomatic.  Dyslipidemia History of dyslipidemia on statin therapy With lipid profile performed 12/12/2022 revealing total cholesterol 155, LDL of 55 and HDL of 82.     Runell Gess MD FACP,FACC,FAHA, [facility] 01/05/2024 12:21 PM

## 2024-01-05 NOTE — Assessment & Plan Note (Signed)
History of essential hypertension her blood pressure measured today at 96/50.  He is on amlodipine and enalapril which I am going to down titrate.  He does complain of some dizziness.

## 2024-01-05 NOTE — Assessment & Plan Note (Signed)
History of CAD status post RCA and ramus branch intervention by myself back in 1997.  I catheterized him 12/22/2003 revealing patent stents with 60% mid LAD lesion and normal LV function.  I catheterized him again 08/04/2010 revealing patent stents with noncritical CAD.  His last Myoview performed 6/33/21 showed lateral scar without ischemia unchanged from prior stress test.  He is asymptomatic.

## 2024-01-05 NOTE — Assessment & Plan Note (Signed)
History of dyslipidemia on statin therapy With lipid profile performed 12/12/2022 revealing total cholesterol 155, LDL of 55 and HDL of 82.

## 2024-01-09 ENCOUNTER — Telehealth: Payer: Self-pay | Admitting: Cardiovascular Disease

## 2024-01-09 DIAGNOSIS — M62551 Muscle wasting and atrophy, not elsewhere classified, right thigh: Secondary | ICD-10-CM | POA: Diagnosis not present

## 2024-01-09 DIAGNOSIS — R2689 Other abnormalities of gait and mobility: Secondary | ICD-10-CM | POA: Diagnosis not present

## 2024-01-09 DIAGNOSIS — M159 Polyosteoarthritis, unspecified: Secondary | ICD-10-CM | POA: Diagnosis not present

## 2024-01-09 DIAGNOSIS — R2681 Unsteadiness on feet: Secondary | ICD-10-CM | POA: Diagnosis not present

## 2024-01-09 DIAGNOSIS — M62561 Muscle wasting and atrophy, not elsewhere classified, right lower leg: Secondary | ICD-10-CM | POA: Diagnosis not present

## 2024-01-09 DIAGNOSIS — M62552 Muscle wasting and atrophy, not elsewhere classified, left thigh: Secondary | ICD-10-CM | POA: Diagnosis not present

## 2024-01-09 DIAGNOSIS — M62562 Muscle wasting and atrophy, not elsewhere classified, left lower leg: Secondary | ICD-10-CM | POA: Diagnosis not present

## 2024-01-09 NOTE — Telephone Encounter (Signed)
 Pt c/o medication issue:  1. Name of Medication:  Enalapril   2. How are you currently taking this medication (dosage and times per day)?   3. Are you having a reaction (difficulty breathing--STAT)?   4. What is your medication issue?    Aisattou with [facility] states she reviewed medication changes on after visit summary. Patient was advised to decrease Amlodipine  and Verapamil, but patient doesn't take Verapamil. He takes Enalapril . Please clarify.

## 2024-01-09 NOTE — Telephone Encounter (Signed)
 Called and spoke to Aisattou at [facility] concerning patient's Enalapril . They are requesting clarification for Enalapril  vs Verapamil. Spoke to [facility], [facility] and Dr Court patient is supposed to decrease Enalapril  from 10 mg to 5 mg. Paperwork from [facility] filled out correctly and faxed back to them.

## 2024-01-10 ENCOUNTER — Telehealth: Payer: Self-pay | Admitting: Cardiovascular Disease

## 2024-01-10 DIAGNOSIS — M62552 Muscle wasting and atrophy, not elsewhere classified, left thigh: Secondary | ICD-10-CM | POA: Diagnosis not present

## 2024-01-10 DIAGNOSIS — N1831 Chronic kidney disease, stage 3a: Secondary | ICD-10-CM | POA: Diagnosis not present

## 2024-01-10 DIAGNOSIS — R42 Dizziness and giddiness: Secondary | ICD-10-CM | POA: Diagnosis not present

## 2024-01-10 DIAGNOSIS — K5900 Constipation, unspecified: Secondary | ICD-10-CM | POA: Diagnosis not present

## 2024-01-10 DIAGNOSIS — E785 Hyperlipidemia, unspecified: Secondary | ICD-10-CM | POA: Diagnosis not present

## 2024-01-10 DIAGNOSIS — I25119 Atherosclerotic heart disease of native coronary artery with unspecified angina pectoris: Secondary | ICD-10-CM | POA: Diagnosis not present

## 2024-01-10 DIAGNOSIS — M62561 Muscle wasting and atrophy, not elsewhere classified, right lower leg: Secondary | ICD-10-CM | POA: Diagnosis not present

## 2024-01-10 DIAGNOSIS — C61 Malignant neoplasm of prostate: Secondary | ICD-10-CM | POA: Diagnosis not present

## 2024-01-10 DIAGNOSIS — J309 Allergic rhinitis, unspecified: Secondary | ICD-10-CM | POA: Diagnosis not present

## 2024-01-10 DIAGNOSIS — R2681 Unsteadiness on feet: Secondary | ICD-10-CM | POA: Diagnosis not present

## 2024-01-10 DIAGNOSIS — M62562 Muscle wasting and atrophy, not elsewhere classified, left lower leg: Secondary | ICD-10-CM | POA: Diagnosis not present

## 2024-01-10 DIAGNOSIS — M159 Polyosteoarthritis, unspecified: Secondary | ICD-10-CM | POA: Diagnosis not present

## 2024-01-10 DIAGNOSIS — G9009 Other idiopathic peripheral autonomic neuropathy: Secondary | ICD-10-CM | POA: Diagnosis not present

## 2024-01-10 DIAGNOSIS — M6281 Muscle weakness (generalized): Secondary | ICD-10-CM | POA: Diagnosis not present

## 2024-01-10 DIAGNOSIS — I1310 Hypertensive heart and chronic kidney disease without heart failure, with stage 1 through stage 4 chronic kidney disease, or unspecified chronic kidney disease: Secondary | ICD-10-CM | POA: Diagnosis not present

## 2024-01-10 DIAGNOSIS — M62551 Muscle wasting and atrophy, not elsewhere classified, right thigh: Secondary | ICD-10-CM | POA: Diagnosis not present

## 2024-01-10 DIAGNOSIS — H548 Legal blindness, as defined in USA: Secondary | ICD-10-CM | POA: Diagnosis not present

## 2024-01-10 DIAGNOSIS — I252 Old myocardial infarction: Secondary | ICD-10-CM | POA: Diagnosis not present

## 2024-01-10 MED ORDER — AMLODIPINE BESYLATE 2.5 MG PO TABS: 2.5000 mg | ORAL_TABLET | Freq: Every day | ORAL | 3 refills | Status: DC

## 2024-01-10 MED ORDER — ENALAPRIL MALEATE 5 MG PO TABS: 5.0000 mg | ORAL_TABLET | Freq: Every day | ORAL | 3 refills | Status: DC

## 2024-01-10 NOTE — Telephone Encounter (Signed)
 Pt c/o medication issue:  1. Name of Medication: Amlodipine   2. How are you currently taking this medication (dosage and times per day)?   3. Are you having a reaction (difficulty breathing--STAT)?   4. What is your medication issue? Need correct direction for the Amlodipine 

## 2024-01-10 NOTE — Telephone Encounter (Signed)
 Called [phone] and spoke with Aissatou nurse at [facility].  Verified dose of amlodipine  and enalopril. She also requested refills be sent to [facility] and that has been done.

## 2024-01-12 DIAGNOSIS — M62551 Muscle wasting and atrophy, not elsewhere classified, right thigh: Secondary | ICD-10-CM | POA: Diagnosis not present

## 2024-01-12 DIAGNOSIS — M62561 Muscle wasting and atrophy, not elsewhere classified, right lower leg: Secondary | ICD-10-CM | POA: Diagnosis not present

## 2024-01-12 DIAGNOSIS — M62552 Muscle wasting and atrophy, not elsewhere classified, left thigh: Secondary | ICD-10-CM | POA: Diagnosis not present

## 2024-01-12 DIAGNOSIS — M62562 Muscle wasting and atrophy, not elsewhere classified, left lower leg: Secondary | ICD-10-CM | POA: Diagnosis not present

## 2024-01-12 DIAGNOSIS — R2681 Unsteadiness on feet: Secondary | ICD-10-CM | POA: Diagnosis not present

## 2024-01-12 DIAGNOSIS — M159 Polyosteoarthritis, unspecified: Secondary | ICD-10-CM | POA: Diagnosis not present

## 2024-01-15 DIAGNOSIS — M62552 Muscle wasting and atrophy, not elsewhere classified, left thigh: Secondary | ICD-10-CM | POA: Diagnosis not present

## 2024-01-15 DIAGNOSIS — R2681 Unsteadiness on feet: Secondary | ICD-10-CM | POA: Diagnosis not present

## 2024-01-15 DIAGNOSIS — M62562 Muscle wasting and atrophy, not elsewhere classified, left lower leg: Secondary | ICD-10-CM | POA: Diagnosis not present

## 2024-01-15 DIAGNOSIS — M159 Polyosteoarthritis, unspecified: Secondary | ICD-10-CM | POA: Diagnosis not present

## 2024-01-15 DIAGNOSIS — M62551 Muscle wasting and atrophy, not elsewhere classified, right thigh: Secondary | ICD-10-CM | POA: Diagnosis not present

## 2024-01-15 DIAGNOSIS — M62561 Muscle wasting and atrophy, not elsewhere classified, right lower leg: Secondary | ICD-10-CM | POA: Diagnosis not present

## 2024-01-16 DIAGNOSIS — M62561 Muscle wasting and atrophy, not elsewhere classified, right lower leg: Secondary | ICD-10-CM | POA: Diagnosis not present

## 2024-01-16 DIAGNOSIS — M159 Polyosteoarthritis, unspecified: Secondary | ICD-10-CM | POA: Diagnosis not present

## 2024-01-16 DIAGNOSIS — M62551 Muscle wasting and atrophy, not elsewhere classified, right thigh: Secondary | ICD-10-CM | POA: Diagnosis not present

## 2024-01-16 DIAGNOSIS — M62562 Muscle wasting and atrophy, not elsewhere classified, left lower leg: Secondary | ICD-10-CM | POA: Diagnosis not present

## 2024-01-16 DIAGNOSIS — R2681 Unsteadiness on feet: Secondary | ICD-10-CM | POA: Diagnosis not present

## 2024-01-16 DIAGNOSIS — M62552 Muscle wasting and atrophy, not elsewhere classified, left thigh: Secondary | ICD-10-CM | POA: Diagnosis not present

## 2024-01-17 DIAGNOSIS — M62552 Muscle wasting and atrophy, not elsewhere classified, left thigh: Secondary | ICD-10-CM | POA: Diagnosis not present

## 2024-01-17 DIAGNOSIS — F331 Major depressive disorder, recurrent, moderate: Secondary | ICD-10-CM | POA: Diagnosis not present

## 2024-01-17 DIAGNOSIS — M159 Polyosteoarthritis, unspecified: Secondary | ICD-10-CM | POA: Diagnosis not present

## 2024-01-17 DIAGNOSIS — F329 Major depressive disorder, single episode, unspecified: Secondary | ICD-10-CM | POA: Diagnosis not present

## 2024-01-17 DIAGNOSIS — M62561 Muscle wasting and atrophy, not elsewhere classified, right lower leg: Secondary | ICD-10-CM | POA: Diagnosis not present

## 2024-01-17 DIAGNOSIS — R2681 Unsteadiness on feet: Secondary | ICD-10-CM | POA: Diagnosis not present

## 2024-01-17 DIAGNOSIS — M62551 Muscle wasting and atrophy, not elsewhere classified, right thigh: Secondary | ICD-10-CM | POA: Diagnosis not present

## 2024-01-17 DIAGNOSIS — M62562 Muscle wasting and atrophy, not elsewhere classified, left lower leg: Secondary | ICD-10-CM | POA: Diagnosis not present

## 2024-01-21 DIAGNOSIS — M62562 Muscle wasting and atrophy, not elsewhere classified, left lower leg: Secondary | ICD-10-CM | POA: Diagnosis not present

## 2024-01-21 DIAGNOSIS — M159 Polyosteoarthritis, unspecified: Secondary | ICD-10-CM | POA: Diagnosis not present

## 2024-01-21 DIAGNOSIS — R2681 Unsteadiness on feet: Secondary | ICD-10-CM | POA: Diagnosis not present

## 2024-01-21 DIAGNOSIS — M62552 Muscle wasting and atrophy, not elsewhere classified, left thigh: Secondary | ICD-10-CM | POA: Diagnosis not present

## 2024-01-21 DIAGNOSIS — M62561 Muscle wasting and atrophy, not elsewhere classified, right lower leg: Secondary | ICD-10-CM | POA: Diagnosis not present

## 2024-01-21 DIAGNOSIS — M62551 Muscle wasting and atrophy, not elsewhere classified, right thigh: Secondary | ICD-10-CM | POA: Diagnosis not present

## 2024-01-22 DIAGNOSIS — R441 Visual hallucinations: Secondary | ICD-10-CM | POA: Diagnosis not present

## 2024-01-22 DIAGNOSIS — G3184 Mild cognitive impairment, so stated: Secondary | ICD-10-CM | POA: Diagnosis not present

## 2024-01-22 DIAGNOSIS — F411 Generalized anxiety disorder: Secondary | ICD-10-CM | POA: Diagnosis not present

## 2024-01-22 DIAGNOSIS — F331 Major depressive disorder, recurrent, moderate: Secondary | ICD-10-CM | POA: Diagnosis not present

## 2024-01-23 DIAGNOSIS — M159 Polyosteoarthritis, unspecified: Secondary | ICD-10-CM | POA: Diagnosis not present

## 2024-01-23 DIAGNOSIS — M62552 Muscle wasting and atrophy, not elsewhere classified, left thigh: Secondary | ICD-10-CM | POA: Diagnosis not present

## 2024-01-23 DIAGNOSIS — R2681 Unsteadiness on feet: Secondary | ICD-10-CM | POA: Diagnosis not present

## 2024-01-23 DIAGNOSIS — M62551 Muscle wasting and atrophy, not elsewhere classified, right thigh: Secondary | ICD-10-CM | POA: Diagnosis not present

## 2024-01-23 DIAGNOSIS — M62561 Muscle wasting and atrophy, not elsewhere classified, right lower leg: Secondary | ICD-10-CM | POA: Diagnosis not present

## 2024-01-23 DIAGNOSIS — M62562 Muscle wasting and atrophy, not elsewhere classified, left lower leg: Secondary | ICD-10-CM | POA: Diagnosis not present

## 2024-01-24 DIAGNOSIS — M62562 Muscle wasting and atrophy, not elsewhere classified, left lower leg: Secondary | ICD-10-CM | POA: Diagnosis not present

## 2024-01-24 DIAGNOSIS — M62561 Muscle wasting and atrophy, not elsewhere classified, right lower leg: Secondary | ICD-10-CM | POA: Diagnosis not present

## 2024-01-24 DIAGNOSIS — I252 Old myocardial infarction: Secondary | ICD-10-CM | POA: Diagnosis not present

## 2024-01-24 DIAGNOSIS — N1831 Chronic kidney disease, stage 3a: Secondary | ICD-10-CM | POA: Diagnosis not present

## 2024-01-24 DIAGNOSIS — M62551 Muscle wasting and atrophy, not elsewhere classified, right thigh: Secondary | ICD-10-CM | POA: Diagnosis not present

## 2024-01-24 DIAGNOSIS — M159 Polyosteoarthritis, unspecified: Secondary | ICD-10-CM | POA: Diagnosis not present

## 2024-01-24 DIAGNOSIS — R2681 Unsteadiness on feet: Secondary | ICD-10-CM | POA: Diagnosis not present

## 2024-01-24 DIAGNOSIS — M62552 Muscle wasting and atrophy, not elsewhere classified, left thigh: Secondary | ICD-10-CM | POA: Diagnosis not present

## 2024-01-26 DIAGNOSIS — M62552 Muscle wasting and atrophy, not elsewhere classified, left thigh: Secondary | ICD-10-CM | POA: Diagnosis not present

## 2024-01-26 DIAGNOSIS — M62562 Muscle wasting and atrophy, not elsewhere classified, left lower leg: Secondary | ICD-10-CM | POA: Diagnosis not present

## 2024-01-26 DIAGNOSIS — R2681 Unsteadiness on feet: Secondary | ICD-10-CM | POA: Diagnosis not present

## 2024-01-26 DIAGNOSIS — M62561 Muscle wasting and atrophy, not elsewhere classified, right lower leg: Secondary | ICD-10-CM | POA: Diagnosis not present

## 2024-01-26 DIAGNOSIS — M159 Polyosteoarthritis, unspecified: Secondary | ICD-10-CM | POA: Diagnosis not present

## 2024-01-26 DIAGNOSIS — M62551 Muscle wasting and atrophy, not elsewhere classified, right thigh: Secondary | ICD-10-CM | POA: Diagnosis not present

## 2024-01-29 DIAGNOSIS — H353130 Nonexudative age-related macular degeneration, bilateral, stage unspecified: Secondary | ICD-10-CM | POA: Diagnosis not present

## 2024-01-29 DIAGNOSIS — M62551 Muscle wasting and atrophy, not elsewhere classified, right thigh: Secondary | ICD-10-CM | POA: Diagnosis not present

## 2024-01-29 DIAGNOSIS — M159 Polyosteoarthritis, unspecified: Secondary | ICD-10-CM | POA: Diagnosis not present

## 2024-01-29 DIAGNOSIS — M62562 Muscle wasting and atrophy, not elsewhere classified, left lower leg: Secondary | ICD-10-CM | POA: Diagnosis not present

## 2024-01-29 DIAGNOSIS — R42 Dizziness and giddiness: Secondary | ICD-10-CM | POA: Diagnosis not present

## 2024-01-29 DIAGNOSIS — R7303 Prediabetes: Secondary | ICD-10-CM | POA: Diagnosis not present

## 2024-01-29 DIAGNOSIS — R2681 Unsteadiness on feet: Secondary | ICD-10-CM | POA: Diagnosis not present

## 2024-01-29 DIAGNOSIS — M62561 Muscle wasting and atrophy, not elsewhere classified, right lower leg: Secondary | ICD-10-CM | POA: Diagnosis not present

## 2024-01-29 DIAGNOSIS — I1310 Hypertensive heart and chronic kidney disease without heart failure, with stage 1 through stage 4 chronic kidney disease, or unspecified chronic kidney disease: Secondary | ICD-10-CM | POA: Diagnosis not present

## 2024-01-29 DIAGNOSIS — M62552 Muscle wasting and atrophy, not elsewhere classified, left thigh: Secondary | ICD-10-CM | POA: Diagnosis not present

## 2024-01-29 DIAGNOSIS — N1831 Chronic kidney disease, stage 3a: Secondary | ICD-10-CM | POA: Diagnosis not present

## 2024-01-29 DIAGNOSIS — E785 Hyperlipidemia, unspecified: Secondary | ICD-10-CM | POA: Diagnosis not present

## 2024-01-30 DIAGNOSIS — M62562 Muscle wasting and atrophy, not elsewhere classified, left lower leg: Secondary | ICD-10-CM | POA: Diagnosis not present

## 2024-01-30 DIAGNOSIS — M159 Polyosteoarthritis, unspecified: Secondary | ICD-10-CM | POA: Diagnosis not present

## 2024-01-30 DIAGNOSIS — M62551 Muscle wasting and atrophy, not elsewhere classified, right thigh: Secondary | ICD-10-CM | POA: Diagnosis not present

## 2024-01-30 DIAGNOSIS — M62561 Muscle wasting and atrophy, not elsewhere classified, right lower leg: Secondary | ICD-10-CM | POA: Diagnosis not present

## 2024-01-30 DIAGNOSIS — R2681 Unsteadiness on feet: Secondary | ICD-10-CM | POA: Diagnosis not present

## 2024-01-30 DIAGNOSIS — M62552 Muscle wasting and atrophy, not elsewhere classified, left thigh: Secondary | ICD-10-CM | POA: Diagnosis not present

## 2024-02-02 DIAGNOSIS — R42 Dizziness and giddiness: Secondary | ICD-10-CM | POA: Diagnosis not present

## 2024-02-02 DIAGNOSIS — M159 Polyosteoarthritis, unspecified: Secondary | ICD-10-CM | POA: Diagnosis not present

## 2024-02-02 DIAGNOSIS — J309 Allergic rhinitis, unspecified: Secondary | ICD-10-CM | POA: Diagnosis not present

## 2024-02-02 DIAGNOSIS — E785 Hyperlipidemia, unspecified: Secondary | ICD-10-CM | POA: Diagnosis not present

## 2024-02-02 DIAGNOSIS — H353130 Nonexudative age-related macular degeneration, bilateral, stage unspecified: Secondary | ICD-10-CM | POA: Diagnosis not present

## 2024-02-02 DIAGNOSIS — I1310 Hypertensive heart and chronic kidney disease without heart failure, with stage 1 through stage 4 chronic kidney disease, or unspecified chronic kidney disease: Secondary | ICD-10-CM | POA: Diagnosis not present

## 2024-02-02 DIAGNOSIS — R2681 Unsteadiness on feet: Secondary | ICD-10-CM | POA: Diagnosis not present

## 2024-02-02 DIAGNOSIS — M62561 Muscle wasting and atrophy, not elsewhere classified, right lower leg: Secondary | ICD-10-CM | POA: Diagnosis not present

## 2024-02-02 DIAGNOSIS — K5900 Constipation, unspecified: Secondary | ICD-10-CM | POA: Diagnosis not present

## 2024-02-02 DIAGNOSIS — M62562 Muscle wasting and atrophy, not elsewhere classified, left lower leg: Secondary | ICD-10-CM | POA: Diagnosis not present

## 2024-02-02 DIAGNOSIS — R7303 Prediabetes: Secondary | ICD-10-CM | POA: Diagnosis not present

## 2024-02-02 DIAGNOSIS — M62552 Muscle wasting and atrophy, not elsewhere classified, left thigh: Secondary | ICD-10-CM | POA: Diagnosis not present

## 2024-02-02 DIAGNOSIS — M62551 Muscle wasting and atrophy, not elsewhere classified, right thigh: Secondary | ICD-10-CM | POA: Diagnosis not present

## 2024-02-02 DIAGNOSIS — M6281 Muscle weakness (generalized): Secondary | ICD-10-CM | POA: Diagnosis not present

## 2024-02-02 DIAGNOSIS — N1831 Chronic kidney disease, stage 3a: Secondary | ICD-10-CM | POA: Diagnosis not present

## 2024-02-09 ENCOUNTER — Inpatient Hospital Stay (HOSPITAL_COMMUNITY)
Admission: EM | Admit: 2024-02-09 | Discharge: 2024-02-15 | DRG: 309 | Disposition: A | Discharge status: Home Health | Source: Skilled Nursing Facility | Attending: Family Medicine | Admitting: Family Medicine

## 2024-02-09 DIAGNOSIS — Z96612 Presence of left artificial shoulder joint: Secondary | ICD-10-CM | POA: Diagnosis present

## 2024-02-09 DIAGNOSIS — Z808 Family history of malignant neoplasm of other organs or systems: Secondary | ICD-10-CM

## 2024-02-09 DIAGNOSIS — I252 Old myocardial infarction: Secondary | ICD-10-CM

## 2024-02-09 DIAGNOSIS — D696 Thrombocytopenia, unspecified: Secondary | ICD-10-CM | POA: Diagnosis present

## 2024-02-09 DIAGNOSIS — Z955 Presence of coronary angioplasty implant and graft: Secondary | ICD-10-CM | POA: Diagnosis not present

## 2024-02-09 DIAGNOSIS — I4892 Unspecified atrial flutter: Secondary | ICD-10-CM | POA: Diagnosis not present

## 2024-02-09 DIAGNOSIS — Z87442 Personal history of urinary calculi: Secondary | ICD-10-CM

## 2024-02-09 DIAGNOSIS — E785 Hyperlipidemia, unspecified: Secondary | ICD-10-CM | POA: Diagnosis not present

## 2024-02-09 DIAGNOSIS — Z888 Allergy status to other drugs, medicaments and biological substances status: Secondary | ICD-10-CM

## 2024-02-09 DIAGNOSIS — A0811 Acute gastroenteropathy due to Norwalk agent: Secondary | ICD-10-CM | POA: Diagnosis not present

## 2024-02-09 DIAGNOSIS — I4891 Unspecified atrial fibrillation: Principal | ICD-10-CM | POA: Diagnosis present

## 2024-02-09 DIAGNOSIS — K529 Noninfective gastroenteritis and colitis, unspecified: Secondary | ICD-10-CM

## 2024-02-09 DIAGNOSIS — I2510 Atherosclerotic heart disease of native coronary artery without angina pectoris: Secondary | ICD-10-CM | POA: Diagnosis present

## 2024-02-09 DIAGNOSIS — G9349 Other encephalopathy: Secondary | ICD-10-CM | POA: Diagnosis not present

## 2024-02-09 DIAGNOSIS — Z905 Acquired absence of kidney: Secondary | ICD-10-CM | POA: Diagnosis not present

## 2024-02-09 DIAGNOSIS — I4819 Other persistent atrial fibrillation: Secondary | ICD-10-CM | POA: Diagnosis not present

## 2024-02-09 DIAGNOSIS — F05 Delirium due to known physiological condition: Secondary | ICD-10-CM | POA: Diagnosis not present

## 2024-02-09 DIAGNOSIS — E876 Hypokalemia: Secondary | ICD-10-CM | POA: Diagnosis present

## 2024-02-09 DIAGNOSIS — N179 Acute kidney failure, unspecified: Secondary | ICD-10-CM | POA: Diagnosis not present

## 2024-02-09 DIAGNOSIS — N400 Enlarged prostate without lower urinary tract symptoms: Secondary | ICD-10-CM | POA: Diagnosis present

## 2024-02-09 DIAGNOSIS — R110 Nausea: Secondary | ICD-10-CM | POA: Diagnosis not present

## 2024-02-09 DIAGNOSIS — Z66 Do not resuscitate: Secondary | ICD-10-CM | POA: Diagnosis present

## 2024-02-09 DIAGNOSIS — M7989 Other specified soft tissue disorders: Secondary | ICD-10-CM | POA: Diagnosis not present

## 2024-02-09 DIAGNOSIS — Z9049 Acquired absence of other specified parts of digestive tract: Secondary | ICD-10-CM

## 2024-02-09 DIAGNOSIS — Z8546 Personal history of malignant neoplasm of prostate: Secondary | ICD-10-CM | POA: Diagnosis not present

## 2024-02-09 DIAGNOSIS — Z79899 Other long term (current) drug therapy: Secondary | ICD-10-CM | POA: Diagnosis not present

## 2024-02-09 DIAGNOSIS — Z885 Allergy status to narcotic agent status: Secondary | ICD-10-CM

## 2024-02-09 DIAGNOSIS — R079 Chest pain, unspecified: Secondary | ICD-10-CM | POA: Diagnosis present

## 2024-02-09 DIAGNOSIS — R42 Dizziness and giddiness: Secondary | ICD-10-CM | POA: Diagnosis not present

## 2024-02-09 DIAGNOSIS — R000 Tachycardia, unspecified: Secondary | ICD-10-CM | POA: Diagnosis present

## 2024-02-09 DIAGNOSIS — R9089 Other abnormal findings on diagnostic imaging of central nervous system: Secondary | ICD-10-CM | POA: Diagnosis not present

## 2024-02-09 DIAGNOSIS — Z87891 Personal history of nicotine dependence: Secondary | ICD-10-CM | POA: Diagnosis not present

## 2024-02-09 DIAGNOSIS — I10 Essential (primary) hypertension: Secondary | ICD-10-CM | POA: Diagnosis not present

## 2024-02-09 DIAGNOSIS — I080 Rheumatic disorders of both mitral and aortic valves: Secondary | ICD-10-CM | POA: Diagnosis present

## 2024-02-09 DIAGNOSIS — R4182 Altered mental status, unspecified: Secondary | ICD-10-CM | POA: Diagnosis not present

## 2024-02-09 DIAGNOSIS — I6782 Cerebral ischemia: Secondary | ICD-10-CM | POA: Diagnosis not present

## 2024-02-09 DIAGNOSIS — Z85528 Personal history of other malignant neoplasm of kidney: Secondary | ICD-10-CM

## 2024-02-09 DIAGNOSIS — Z7982 Long term (current) use of aspirin: Secondary | ICD-10-CM | POA: Diagnosis not present

## 2024-02-09 DIAGNOSIS — Z823 Family history of stroke: Secondary | ICD-10-CM

## 2024-02-09 DIAGNOSIS — Z91041 Radiographic dye allergy status: Secondary | ICD-10-CM

## 2024-02-09 DIAGNOSIS — Z7901 Long term (current) use of anticoagulants: Secondary | ICD-10-CM

## 2024-02-09 LAB — BASIC METABOLIC PANEL
Anion gap: 11 (ref 5–15)
BUN: 30 mg/dL — ABNORMAL HIGH (ref 8–23)
CO2: 21 mmol/L — ABNORMAL LOW (ref 22–32)
Calcium: 8.4 mg/dL — ABNORMAL LOW (ref 8.9–10.3)
Chloride: 107 mmol/L (ref 98–111)
Creatinine, Ser: 1.35 mg/dL — ABNORMAL HIGH (ref 0.61–1.24)
GFR, Estimated: 49 mL/min — ABNORMAL LOW (ref >60)
Glucose, Bld: 161 mg/dL — ABNORMAL HIGH (ref 70–99)
Potassium: 4.8 mmol/L (ref 3.5–5.1)
Sodium: 139 mmol/L (ref 135–145)

## 2024-02-09 LAB — CBC WITH DIFFERENTIAL/PLATELET
Abs Immature Granulocytes: 0.04 10*3/uL (ref 0.00–0.07)
Basophils Absolute: 0 10*3/uL (ref 0.0–0.1)
Basophils Relative: 0 %
Eosinophils Absolute: 0 10*3/uL (ref 0.0–0.5)
Eosinophils Relative: 0 %
HCT: 51 % (ref 39.0–52.0)
Hemoglobin: 16 g/dL (ref 13.0–17.0)
Immature Granulocytes: 1 %
Lymphocytes Relative: 2 %
Lymphs Abs: 0.1 10*3/uL — ABNORMAL LOW (ref 0.7–4.0)
MCH: 30.5 pg (ref 26.0–34.0)
MCHC: 31.4 g/dL (ref 30.0–36.0)
MCV: 97.3 fL (ref 80.0–100.0)
Monocytes Absolute: 0.3 10*3/uL (ref 0.1–1.0)
Monocytes Relative: 3 %
Neutro Abs: 7.7 10*3/uL (ref 1.7–7.7)
Neutrophils Relative %: 94 %
Platelets: 128 10*3/uL — ABNORMAL LOW (ref 150–400)
RBC: 5.24 MIL/uL (ref 4.22–5.81)
RDW: 15.3 % (ref 11.5–15.5)
WBC: 8.2 10*3/uL (ref 4.0–10.5)
nRBC: 0 % (ref 0.0–0.2)

## 2024-02-09 LAB — MAGNESIUM: Magnesium: 2.1 mg/dL (ref 1.7–2.4)

## 2024-02-09 MED ORDER — POLYETHYLENE GLYCOL 3350 17 G PO PACK: 17.0000 g | PACK | Freq: Every day | ORAL | Status: DC | PRN

## 2024-02-09 MED ORDER — TAMSULOSIN HCL 0.4 MG PO CAPS
0.4000 mg | ORAL_CAPSULE | Freq: Every day | ORAL | Status: DC
  Administered 2024-02-10 – 2024-02-14 (×4): 0.4 mg via ORAL
  Filled 2024-02-09 (×5): qty 1

## 2024-02-09 MED ORDER — SODIUM CHLORIDE 0.9 % IV SOLN: INTRAVENOUS | Status: DC

## 2024-02-09 MED ORDER — ACETAMINOPHEN 325 MG PO TABS
650.0000 mg | ORAL_TABLET | Freq: Four times a day (QID) | ORAL | Status: DC | PRN
  Administered 2024-02-11 – 2024-02-12 (×3): 650 mg via ORAL
  Filled 2024-02-09 (×3): qty 2

## 2024-02-09 MED ORDER — ACETAMINOPHEN 650 MG RE SUPP: 650.0000 mg | Freq: Four times a day (QID) | RECTAL | Status: DC | PRN

## 2024-02-09 MED ORDER — DILTIAZEM HCL-DEXTROSE 125-5 MG/125ML-% IV SOLN (PREMIX)
5.0000 mg/h | INTRAVENOUS | Status: DC
  Administered 2024-02-09: 5 mg/h via INTRAVENOUS
  Filled 2024-02-09: qty 125

## 2024-02-09 MED ORDER — SODIUM CHLORIDE 0.9% FLUSH
3.0000 mL | Freq: Two times a day (BID) | INTRAVENOUS | Status: DC
  Administered 2024-02-10 – 2024-02-15 (×9): 3 mL via INTRAVENOUS

## 2024-02-09 MED ORDER — SODIUM CHLORIDE 0.9 % IV BOLUS
1000.0000 mL | Freq: Once | INTRAVENOUS | Status: AC
  Administered 2024-02-09: 1000 mL via INTRAVENOUS

## 2024-02-09 MED ORDER — ENOXAPARIN SODIUM 40 MG/0.4ML IJ SOSY
40.0000 mg | PREFILLED_SYRINGE | INTRAMUSCULAR | Status: DC
  Administered 2024-02-10: 40 mg via SUBCUTANEOUS
  Filled 2024-02-09: qty 0.4

## 2024-02-09 NOTE — ED Provider Notes (Signed)
 [place] EMERGENCY DEPARTMENT AT [facility] Provider Note   CSN: [id number] Arrival date & time: 02/09/24  1608     History  Chief Complaint  Patient presents with   Abdominal Pain    Donald Todd is a 89 y.o. male.   Abdominal Pain Patient presents with nausea vomiting diarrhea.  Reportedly where he lives has norovirus going around.  Patient's wife is admitted for similar symptoms.  States he has had about 5 episodes diarrhea and 1 episode of vomiting.  Feels a little weak. Appears to be in atrial flutter with a mildly increased heart rate.  Unknown if he has had this before.  States he sees Dr. Gery Pray.    Past Medical History:  Diagnosis Date   Arthritis    CAD (coronary artery disease)    last cath. 07-09-2010   Cancer [facility])    kidney cancer , prostate cancer    Chronic kidney disease    lt kidnet removed   H/O cardiovascular stress test 08-04-2010   ef 55% NO ISCHEMIA   H/O unilateral nephrectomy    lt. nephrectomy   Heart attack (HCC) 1993   History of kidney stones    Hyperlipemia    Hypertension    renal dopplers 218.13-normal patency   Inguinal hernia 08/01/11   right   Macular degeneration    Prostate cancer [facility])    Prostate disease    Cancer S/P seed implant   Renal mass    Vertigo    Past Surgical History:  Procedure Laterality Date   ANGIOPLASTY  1993   athroscopic knee surgery  2002   right   bone scan  2005   CARDIAC CATHETERIZATION  07-09-2010   normal left ventricular function, coronary obsstructive disease with 20% proximal an 30-40% mid lt. anterior descending narrowing ; widely patent stent in the proximal ramus intermediate vessel; 50-70-% narrowing in the mid circumflex,and widely patent stent  in the rt. coronary    CARDIAC CATHETERIZATION  12-22-2003   widely patent stents with noncritical coronary artery disease and normalLV function   CHOLECYSTECTOMY  05/05/2009   Laparoscopic   colonscopy  2004   with biopsy   CORONARY  ANGIOPLASTY WITH STENT PLACEMENT  03-15-1996   pci to rci and ramus branch using j&j [id number] stent,post dialated  at  20atm (rca)  and 14 atm (ramus branch. ef 55%             HERNIA REPAIR  08/01/11   RIH   INGUINAL HERNIA REPAIR Left 03/29/2013   Procedure: HERNIA REPAIR INGUINAL ADULT;  Surgeon: Mariella Saa, MD;  Location: [facility] ORS;  Service: General;  Laterality: Left;   INSERTION OF MESH Left 03/29/2013   Procedure: INSERTION OF MESH;  Surgeon: Mariella Saa, MD;  Location: [facility] ORS;  Service: General;  Laterality: Left;   JOINT REPLACEMENT  2006 and 2010   left 2006, right 2010   KIDNEY SURGERY  2002 and 2005   2002 - partial removal of left. 2005 complete removal   PROSTATE BIOPSY     prostate seed implant     radiation treatment  2002   prostate   REVERSE SHOULDER ARTHROPLASTY Left 05/26/2017   Procedure: REVERSE LEFT TOTAL SHOULDER ARTHROPLASTY;  Surgeon: Beverely Low, MD;  Location: [facility] OR;  Service: Orthopedics;  Laterality: Left;   stent implants  1997     Home Medications Prior to Admission medications   Medication Sig Start Date End Date Taking? Authorizing Provider  acetaminophen (TYLENOL) 325 MG tablet Take 1-2 tablets (325-650 mg total) by mouth every 4 (four) hours as needed for mild pain. 02/21/23   Love, Evlyn Kanner, PA-C  amLODipine (NORVASC) 2.5 MG tablet Take 1 tablet (2.5 mg total) by mouth daily. 01/10/24 01/09/25  Runell Gess, MD  aspirin 81 MG tablet Take 81 mg by mouth at bedtime.    [provider]  atorvastatin (LIPITOR) 40 MG tablet TAKE 1 TABLET AT BEDTIME 03/07/23   Runell Gess, MD  calcium carbonate (TUMS - DOSED IN MG ELEMENTAL CALCIUM) 500 MG chewable tablet Chew 1,000 mg by mouth daily as needed for indigestion or heartburn. Patient not taking: Reported on 01/05/2024    [provider]  docusate sodium (COLACE) 100 MG capsule Take 1 capsule (100 mg total) by mouth every 12 (twelve) hours. Patient taking differently: Take 200  mg by mouth daily. 03/10/21   Dione Booze, MD  enalapril (VASOTEC) 5 MG tablet Take 1 tablet (5 mg total) by mouth daily. 01/10/24   Runell Gess, MD  ipratropium (ATROVENT) 0.03 % nasal spray Place 1-2 sprays into both nostrils 2 (two) times daily as needed (allergies). 05/29/20   [provider]  loratadine (CLARITIN) 10 MG tablet Take 10 mg by mouth daily. 09/29/21   [provider]  meclizine (ANTIVERT) 25 MG tablet Take 25 mg by mouth as needed for dizziness.    [provider]  Multiple Vitamins-Minerals (PRESERVISION AREDS 2) CAPS Take 1 capsule by mouth 2 (two) times daily.    [provider]  nitroGLYCERIN (NITROSTAT) 0.4 MG SL tablet Place 1 tablet (0.4 mg total) under the tongue as needed. 10/05/23   Runell Gess, MD  polyethylene glycol (MIRALAX / GLYCOLAX) 17 g packet Take 17 g by mouth daily. 03/10/21   Dione Booze, MD  QUEtiapine (SEROQUEL) 25 MG tablet Take 1 tablet (25 mg total) by mouth at bedtime. 02/21/23   Love, Evlyn Kanner, PA-C  tamsulosin (FLOMAX) 0.4 MG CAPS capsule Take 1-2 capsules (0.4-0.8 mg total) by mouth daily after supper. 04/04/23   Horton Chin, MD      Allergies    Tape, Hydrocodone, Iohexol, and Oxycodone    Review of Systems   Review of Systems  Gastrointestinal:  Positive for abdominal pain.    Physical Exam Updated Vital Signs BP (!) 142/94 (BP Location: Left Arm)   Pulse 60   Temp 98.3 F (36.8 C) (Oral)   Resp 16   Ht 5\' 7"  (1.702 m)   Wt 64 kg   SpO2 99%   BMI 22.10 kg/m  Physical Exam Vitals and nursing note reviewed.  HENT:     Head: Normocephalic.  Cardiovascular:     Rate and Rhythm: Tachycardia present. Rhythm irregular.  Pulmonary:     Breath sounds: No wheezing or rhonchi.  Abdominal:     Tenderness: There is no abdominal tenderness.     Hernia: No hernia is present.  Skin:    General: Skin is warm.  Neurological:     Mental Status: He is alert.     ED Results / Procedures /  Treatments   Labs (all labs ordered are listed, but only abnormal results are displayed) Labs Reviewed  BASIC METABOLIC PANEL - Abnormal; Notable for the following components:      Result Value   CO2 21 (*)    Glucose, Bld 161 (*)    BUN 30 (*)    Creatinine, Ser 1.35 (*)  Calcium 8.4 (*)    GFR, Estimated 49 (*)    All other components within normal limits  CBC WITH DIFFERENTIAL/PLATELET - Abnormal; Notable for the following components:   Platelets 128 (*)    Lymphs Abs 0.1 (*)    All other components within normal limits  MAGNESIUM    EKG None  Radiology No results found.  Procedures Procedures    Medications Ordered in ED Medications  sodium chloride 0.9 % bolus 1,000 mL (has no administration in time range)    ED Course/ Medical Decision Making/ A&P                                 Medical Decision Making Amount and/or Complexity of Data Reviewed Labs: ordered.   Patient with nausea vomiting diarrhea.  Likely norovirus since that is going around. However does appear to be in atrial flutter with mildly increased rates.  Patient does not know if he is had this before reviewed Dr. Benay Spice note from January.  It appears that his EKG did show atrial flutter at that time, however there is no mention of the diagnosis of atrial flutter in that note.  Not on anticoagulation.  Will give fluid bolus.  Will check basic blood work.  Blood work overall reassuring. creatinine at baseline.  Electrolytes reassuring.  However continued has some mild tachycardia at times.  Heart rate will go up to 130.[name]  Could be point of dehydration but giving IV fluids.  However with atrial flutter with RVR I think it benefit from mission to the hospital.  It appears that a month and a half ago he had a cardiology visit showing the atrial flutter so not a candidate for cardioversion.  With him feeling dizzy and norovirus along with tachycardia will consult hospitalist for  admission.          Final Clinical Impression(s) / ED Diagnoses Final diagnoses:  Atrial flutter with rapid ventricular response (HCC)  Norovirus    Rx / DC Orders ED Discharge Orders     None         Benjiman Core, MD 02/09/24 1842

## 2024-02-09 NOTE — ED Triage Notes (Signed)
 Per EMS. Outbreak of norovirus at [facility], sent to ED for eval.  BP 106/60 HR 94 RR 20 98 on RA

## 2024-02-09 NOTE — H&P (Addendum)
 History and Physical    Patient: Donald Todd FAO:[id number] DOB: 1929/04/28 DOA: 02/09/2024 DOS: the patient was seen and examined on 02/09/2024 PCP: Daisy Floro, MD  Patient coming from: ALF/ILF  Chief Complaint:  Chief Complaint  Patient presents with   Abdominal Pain   HPI: Donald Todd is a 89 y.o. male with medical history significant of medical issues as listed below.  Patient lives at assisted living facility.  Patient advises that his wife recently suffered from vomiting and diarrhea and is actually admitted at [facility] long facility.  Patient was in his usual state of health till yesterday when he reports having new onset of diarrhea about 3 bowel movement watery brown.  As well as 2 episodes of vomiting.  There was no fever or abdominal pain.  No chest pain or shortness of breath.  Last night patient did reasonably well and he thought that his his symptoms had resolved.  Unfortunately today patient had another episode of vomiting and 2 more episodes of watery bowel movement.  Patient felt extremely weak/lightheaded and came to the ER.  Here in the ER patient is noted to have atrial fibrillation with RVR.  Patient has been treated with a liter of normal saline bolus.  Medical evaluation is sought. Review of Systems: As mentioned in the history of present illness. All other systems reviewed and are negative. Past Medical History:  Diagnosis Date   Arthritis    CAD (coronary artery disease)    last cath. 07-09-2010   Cancer [facility])    kidney cancer , prostate cancer    Chronic kidney disease    lt kidnet removed   H/O cardiovascular stress test 08-04-2010   ef 55% NO ISCHEMIA   H/O unilateral nephrectomy    lt. nephrectomy   Heart attack (HCC) 1993   History of kidney stones    Hyperlipemia    Hypertension    renal dopplers 218.13-normal patency   Inguinal hernia 08/01/11   right   Macular degeneration    Prostate cancer [facility])    Prostate disease    Cancer S/P seed  implant   Renal mass    Vertigo    Past Surgical History:  Procedure Laterality Date   ANGIOPLASTY  1993   athroscopic knee surgery  2002   right   bone scan  2005   CARDIAC CATHETERIZATION  07-09-2010   normal left ventricular function, coronary obsstructive disease with 20% proximal an 30-40% mid lt. anterior descending narrowing ; widely patent stent in the proximal ramus intermediate vessel; 50-70-% narrowing in the mid circumflex,and widely patent stent  in the rt. coronary    CARDIAC CATHETERIZATION  12-22-2003   widely patent stents with noncritical coronary artery disease and normalLV function   CHOLECYSTECTOMY  05/05/2009   Laparoscopic   colonscopy  2004   with biopsy   CORONARY ANGIOPLASTY WITH STENT PLACEMENT  03-15-1996   pci to rci and ramus branch using j&j [id number] stent,post dialated  at  20atm (rca)  and 14 atm (ramus branch. ef 55%             HERNIA REPAIR  08/01/11   RIH   INGUINAL HERNIA REPAIR Left 03/29/2013   Procedure: HERNIA REPAIR INGUINAL ADULT;  Surgeon: Mariella Saa, MD;  Location: [facility];  Service: General;  Laterality: Left;   INSERTION OF MESH Left 03/29/2013   Procedure: INSERTION OF MESH;  Surgeon: Mariella Saa, MD;  Location: [facility];  Service: General;  Laterality: Left;   JOINT REPLACEMENT  2006 and 2010   left 2006, right 2010   KIDNEY SURGERY  2002 and 2005   2002 - partial removal of left. 2005 complete removal   PROSTATE BIOPSY     prostate seed implant     radiation treatment  2002   prostate   REVERSE SHOULDER ARTHROPLASTY Left 05/26/2017   Procedure: REVERSE LEFT TOTAL SHOULDER ARTHROPLASTY;  Surgeon: Beverely Low, MD;  Location: [facility] OR;  Service: Orthopedics;  Laterality: Left;   stent implants  1997   Social History:  reports that he quit smoking about 60 years ago. His smoking use included cigarettes and cigars. He started smoking about 73 years ago. He has a 3.3 pack-year smoking history. He has never used smokeless tobacco. He  reports that he does not drink alcohol and does not use drugs.  Allergies  Allergen Reactions   Tape Hives    Surgical tape - causes blisters   Hydrocodone     Shaking uncontrollably    Iohexol      Desc: PT DOES RECALL REACTION JUST SAYS IT WAS A LONG TIME AGO FOR KIDNEY X-RAYS AND HE DIDN'T TOLERATE IT WELL-ARS 05/02/09-NOTE E-CHART STATES A HOT FEELING IS HIS REACTION, BUT HE CAN'T CONFIRM THAT WAS ALL    Oxycodone Other (See Comments)    Shaking uncontrollably     Family History  Problem Relation Age of Onset   Cancer Mother        stomach?   Stroke Father    Stroke Brother    Skin cancer Brother     Prior to Admission medications   Medication Sig Start Date End Date Taking? Authorizing Provider  acetaminophen (TYLENOL) 325 MG tablet Take 1-2 tablets (325-650 mg total) by mouth every 4 (four) hours as needed for mild pain. 02/21/23   Love, Evlyn Kanner, PA-C  amLODipine (NORVASC) 2.5 MG tablet Take 1 tablet (2.5 mg total) by mouth daily. 01/10/24 01/09/25  Runell Gess, MD  aspirin 81 MG tablet Take 81 mg by mouth at bedtime.    [provider]  atorvastatin (LIPITOR) 40 MG tablet TAKE 1 TABLET AT BEDTIME 03/07/23   Runell Gess, MD  calcium carbonate (TUMS - DOSED IN MG ELEMENTAL CALCIUM) 500 MG chewable tablet Chew 1,000 mg by mouth daily as needed for indigestion or heartburn. Patient not taking: Reported on 01/05/2024    [provider]  docusate sodium (COLACE) 100 MG capsule Take 1 capsule (100 mg total) by mouth every 12 (twelve) hours. Patient taking differently: Take 200 mg by mouth daily. 03/10/21   Dione Booze, MD  enalapril (VASOTEC) 5 MG tablet Take 1 tablet (5 mg total) by mouth daily. 01/10/24   Runell Gess, MD  ipratropium (ATROVENT) 0.03 % nasal spray Place 1-2 sprays into both nostrils 2 (two) times daily as needed (allergies). 05/29/20   [provider]  loratadine (CLARITIN) 10 MG tablet Take 10 mg by mouth daily. 09/29/21    [provider]  meclizine (ANTIVERT) 25 MG tablet Take 25 mg by mouth as needed for dizziness.    [provider]  Multiple Vitamins-Minerals (PRESERVISION AREDS 2) CAPS Take 1 capsule by mouth 2 (two) times daily.    [provider]  nitroGLYCERIN (NITROSTAT) 0.4 MG SL tablet Place 1 tablet (0.4 mg total) under the tongue as needed. 10/05/23   Runell Gess, MD  polyethylene glycol (MIRALAX / GLYCOLAX) 17 g packet Take 17 g by mouth  daily. 03/10/21   Dione Booze, MD  QUEtiapine (SEROQUEL) 25 MG tablet Take 1 tablet (25 mg total) by mouth at bedtime. 02/21/23   Love, Evlyn Kanner, PA-C  tamsulosin (FLOMAX) 0.4 MG CAPS capsule Take 1-2 capsules (0.4-0.8 mg total) by mouth daily after supper. 04/04/23   Horton Chin, MD    Physical Exam: Vitals:   02/09/24 1722 02/09/24 1730  BP: (!) 142/94 (!) 150/101  Pulse: 60 62  Resp: 16 17  Temp: 98.3 F (36.8 C)   TempSrc: Oral   SpO2: 99% 98%  Weight: 64 kg   Height: 5\' 7"  (1.702 m)    General: Patient is alert and awake, appears to be in no distress.  Gives a coherent account of his symptoms Respiratory exam: Bilateral intravesicular Cardiovascular exam S1-S2 normal Abdomen soft nontender Extremities warm without edema. Data Reviewed:  Labs on Admission:  Results for orders placed or performed during the hospital encounter of 02/09/24 (from the past 24 hours)  Basic metabolic panel     Status: Abnormal   Collection Time: 02/09/24  5:43 PM  Result Value Ref Range   Sodium 139 135 - 145 mmol/L   Potassium 4.8 3.5 - 5.1 mmol/L   Chloride 107 98 - 111 mmol/L   CO2 21 (L) 22 - 32 mmol/L   Glucose, Bld 161 (H) 70 - 99 mg/dL   BUN 30 (H) 8 - 23 mg/dL   Creatinine, Ser 6.96 (H) 0.61 - 1.24 mg/dL   Calcium 8.4 (L) 8.9 - 10.3 mg/dL   GFR, Estimated 49 (L) >60 mL/min   Anion gap 11 5 - 15  CBC with Differential     Status: Abnormal   Collection Time: 02/09/24  5:43 PM  Result Value Ref Range   WBC 8.2 4.0 -  10.5 K/uL   RBC 5.24 4.22 - 5.81 MIL/uL   Hemoglobin 16.0 13.0 - 17.0 g/dL   HCT 29.5 28.4 - 13.2 %   MCV 97.3 80.0 - 100.0 fL   MCH 30.5 26.0 - 34.0 pg   MCHC 31.4 30.0 - 36.0 g/dL   RDW 44.0 10.2 - 72.5 %   Platelets 128 (L) 150 - 400 K/uL   nRBC 0.0 0.0 - 0.2 %   Neutrophils Relative % 94 %   Neutro Abs 7.7 1.7 - 7.7 K/uL   Lymphocytes Relative 2 %   Lymphs Abs 0.1 (L) 0.7 - 4.0 K/uL   Monocytes Relative 3 %   Monocytes Absolute 0.3 0.1 - 1.0 K/uL   Eosinophils Relative 0 %   Eosinophils Absolute 0.0 0.0 - 0.5 K/uL   Basophils Relative 0 %   Basophils Absolute 0.0 0.0 - 0.1 K/uL   Immature Granulocytes 1 %   Abs Immature Granulocytes 0.04 0.00 - 0.07 K/uL  Magnesium     Status: None   Collection Time: 02/09/24  5:43 PM  Result Value Ref Range   Magnesium 2.1 1.7 - 2.4 mg/dL   Basic Metabolic Panel: Recent Labs  Lab 02/09/24 1743  NA 139  K 4.8  CL 107  CO2 21*  GLUCOSE 161*  BUN 30*  CREATININE 1.35*  CALCIUM 8.4*  MG 2.1   Liver Function Tests: No results for input(s): "AST", "ALT", "ALKPHOS", "BILITOT", "PROT", "ALBUMIN" in the last 168 hours. No results for input(s): "LIPASE", "AMYLASE" in the last 168 hours. No results for input(s): "AMMONIA" in the last 168 hours. CBC: Recent Labs  Lab 02/09/24 1743  WBC 8.2  NEUTROABS 7.7  HGB  16.0  HCT 51.0  MCV 97.3  PLT 128*   Cardiac Enzymes: No results for input(s): "CKTOTAL", "CKMB", "CKMBINDEX", "TROPONINIHS" in the last 168 hours.  BNP (last 3 results) No results for input(s): "PROBNP" in the last 8760 hours. CBG: No results for input(s): "GLUCAP" in the last 168 hours.  Radiological Exams on Admission:  No results found.  chest X-ray  EKG: Independently reviewed. Afib.  No intake/output data recorded. No intake/output data recorded.       Assessment and Plan: * A-fib [facility]) It does not look liek this has been worked up thus far. Will check TSH, echo. Given diangosis of HTN and his age,  aptient is candidate for anticoagulation. Will check baseline inr ptt prior discusion about anticoagualtion with patient. Slight RVR. Could be dehydration/inflamation from gastroenteritis. C.w. fluids. Will start iv diltiazem as well. Hold off on other anti_HTN agents.  Gastroenteritis X 2 days. No WBC, no fever, no abd pain. Patinet non toxic. Sick contact -wife. Getting iv fluids. Advance diet as tolerated. Clinical monitoring for now.   Home med rec pending pharmcy input   Advance Care Planning:   Code Status: Do not attempt resuscitation (DNR) PRE-ARREST INTERVENTIONS DESIRED per patient. And prior docuemntaiton  Consults: none at this time.  Family Communication: per patient. Wife also hospitalised.  Severity of Illness: The appropriate patient status for this patient is INPATIENT. Inpatient status is judged to be reasonable and necessary in order to provide the required intensity of service to ensure the patient's safety. The patient's presenting symptoms, physical exam findings, and initial radiographic and laboratory data in the context of their chronic comorbidities is felt to place them at high risk for further clinical deterioration. Furthermore, it is not anticipated that the patient will be medically stable for discharge from the hospital within 2 midnights of admission.   * I certify that at the point of admission it is my clinical judgment that the patient will require inpatient hospital care spanning beyond 2 midnights from the point of admission due to high intensity of service, high risk for further deterioration and high frequency of surveillance required.*  Author: Nolberto Hanlon, MD 02/09/2024 8:14 PM  For on call review [email or web address].

## 2024-02-09 NOTE — Assessment & Plan Note (Signed)
 X 2 days. No WBC, no fever, no abd pain. Patinet non toxic. Sick contact -wife. Getting iv fluids. Advance diet as tolerated. Clinical monitoring for now.

## 2024-02-09 NOTE — Assessment & Plan Note (Addendum)
 It does not look liek this has been worked up thus far. Will check TSH, echo. Given diangosis of HTN and his age, aptient is candidate for anticoagulation. Will check baseline inr ptt prior discusion about anticoagualtion with patient. Slight RVR. Could be dehydration/inflamation from gastroenteritis. C.w. fluids. Will start iv diltiazem as well. Hold off on other anti_HTN agents.

## 2024-02-10 ENCOUNTER — Encounter (HOSPITAL_COMMUNITY): Payer: Self-pay | Admitting: Internal Medicine

## 2024-02-10 ENCOUNTER — Other Ambulatory Visit: Payer: Self-pay

## 2024-02-10 ENCOUNTER — Inpatient Hospital Stay (HOSPITAL_COMMUNITY)

## 2024-02-10 DIAGNOSIS — I4891 Unspecified atrial fibrillation: Secondary | ICD-10-CM | POA: Diagnosis not present

## 2024-02-10 DIAGNOSIS — I4892 Unspecified atrial flutter: Secondary | ICD-10-CM | POA: Diagnosis not present

## 2024-02-10 DIAGNOSIS — K529 Noninfective gastroenteritis and colitis, unspecified: Secondary | ICD-10-CM | POA: Diagnosis not present

## 2024-02-10 LAB — CBC
HCT: 43.7 % (ref 39.0–52.0)
Hemoglobin: 13.7 g/dL (ref 13.0–17.0)
MCH: 30.2 pg (ref 26.0–34.0)
MCHC: 31.4 g/dL (ref 30.0–36.0)
MCV: 96.3 fL (ref 80.0–100.0)
Platelets: 107 10*3/uL — ABNORMAL LOW (ref 150–400)
RBC: 4.54 MIL/uL (ref 4.22–5.81)
RDW: 15.3 % (ref 11.5–15.5)
WBC: 6.3 10*3/uL (ref 4.0–10.5)
nRBC: 0 % (ref 0.0–0.2)

## 2024-02-10 LAB — TSH: TSH: 1.208 u[IU]/mL (ref 0.350–4.500)

## 2024-02-10 LAB — BASIC METABOLIC PANEL
Anion gap: 9 (ref 5–15)
BUN: 30 mg/dL — ABNORMAL HIGH (ref 8–23)
CO2: 21 mmol/L — ABNORMAL LOW (ref 22–32)
Calcium: 7.9 mg/dL — ABNORMAL LOW (ref 8.9–10.3)
Chloride: 108 mmol/L (ref 98–111)
Creatinine, Ser: 1.25 mg/dL — ABNORMAL HIGH (ref 0.61–1.24)
GFR, Estimated: 54 mL/min — ABNORMAL LOW (ref >60)
Glucose, Bld: 117 mg/dL — ABNORMAL HIGH (ref 70–99)
Potassium: 4 mmol/L (ref 3.5–5.1)
Sodium: 138 mmol/L (ref 135–145)

## 2024-02-10 LAB — ECHOCARDIOGRAM COMPLETE
Area-P 1/2: 4.49 cm2
Calc EF: 40.4 %
Height: 67 in
S' Lateral: 3.2 cm
Single Plane A2C EF: 36.4 %
Single Plane A4C EF: 43.4 %
Weight: 2211.65 [oz_av]

## 2024-02-10 LAB — PROTIME-INR
INR: 1.2 (ref 0.8–1.2)
Prothrombin Time: 15.7 s — ABNORMAL HIGH (ref 11.4–15.2)

## 2024-02-10 LAB — MRSA NEXT GEN BY PCR, NASAL: MRSA by PCR Next Gen: NOT DETECTED

## 2024-02-10 LAB — APTT: aPTT: 30 s (ref 24–36)

## 2024-02-10 MED ORDER — PERFLUTREN LIPID MICROSPHERE
1.0000 mL | INTRAVENOUS | Status: AC | PRN
  Administered 2024-02-10: 2 mL via INTRAVENOUS

## 2024-02-10 MED ORDER — METOPROLOL SUCCINATE ER 25 MG PO TB24
12.5000 mg | ORAL_TABLET | Freq: Every day | ORAL | Status: DC
  Administered 2024-02-10 – 2024-02-11 (×2): 12.5 mg via ORAL
  Filled 2024-02-10 (×2): qty 1

## 2024-02-10 MED ORDER — METOPROLOL SUCCINATE ER 25 MG PO TB24: 25.0000 mg | ORAL_TABLET | Freq: Every day | ORAL | Status: DC

## 2024-02-10 MED ORDER — APIXABAN 5 MG PO TABS
5.0000 mg | ORAL_TABLET | Freq: Two times a day (BID) | ORAL | Status: DC
  Administered 2024-02-10 – 2024-02-15 (×10): 5 mg via ORAL
  Filled 2024-02-10 (×10): qty 1

## 2024-02-10 NOTE — Discharge Instructions (Signed)

## 2024-02-10 NOTE — Progress Notes (Signed)
 [facility]                                                                               Donald Todd, is a 89 y.o. male, DOB - November 17, 1929, ZOX:[id number] Admit date - 02/09/2024    Outpatient Primary MD for the patient is Daisy Floro, MD  LOS - 1  days    Brief summary   Donald Todd is a 89 y.o. male with medical history significant of medical issues as listed below.  Patient lives at assisted living facility.  Patient advises that his wife recently suffered from vomiting and diarrhea and is actually admitted at [facility] long facility.   Unfortunately today patient had another episode of vomiting and 2 more episodes of watery bowel movement. Patient felt extremely weak/lightheaded and came to the ER.   Assessment & Plan    Assessment and Plan:   New onset atrial fibrillation:  Rate controlled.  Was on IV cardizem,, had 2 sec pause, discontinued it.  Restarted patient's metoprolol 12. 5 mg  Started the patient on Eliquis for anti coagulation.    Acute gastroenteritis Slowly improving.      Estimated body mass index is 21.65 kg/m as calculated from the following:   Height as of this encounter: 5\' 7"  (1.702 m).   Weight as of this encounter: 62.7 kg.  Code Status: DNR DVT Prophylaxis:  enoxaparin (LOVENOX) injection 40 mg Start: 02/10/24 0800 SCDs Start: 02/09/24 2013   Level of Care: Level of care: Telemetry Family Communication: not at bedside.   Disposition Plan:     Remains inpatient appropriate:  pending clinical improvement.   Procedures:  echo  Consultants:   None.   Antimicrobials:   Anti-infectives (From admission, onward)    None        Medications  Scheduled Meds:  enoxaparin (LOVENOX) injection  40 mg Subcutaneous Q24H   metoprolol succinate  25 mg Oral Daily   sodium chloride flush  3 mL Intravenous Q12H   tamsulosin  0.4 mg Oral QPC supper   Continuous Infusions:  sodium chloride 75 mL/hr at 02/10/24  1249   PRN Meds:.acetaminophen **OR** acetaminophen, polyethylene glycol    Subjective:   [name] was seen and examined today.  No diarrhea.   Objective:   Vitals:   02/09/24 2305 02/10/24 0409 02/10/24 0806 02/10/24 1141  BP: (!) 147/88 (!) 148/98 132/80 114/76  Pulse: 69 89 86 91  Resp: (!) 24 16 19 16   Temp: 98.7 F (37.1 C) 97.7 F (36.5 C) 97.9 F (36.6 C) 97.7 F (36.5 C)  TempSrc: Oral Oral Oral Oral  SpO2: 99% 96%  97%  Weight: 62.7 kg     Height: 5\' 7"  (1.702 m)       Intake/Output Summary (Last 24 hours) at 02/10/2024 1333 Last data filed at 02/10/2024 0800 Gross per 24 hour  Intake 120 ml  Output 225 ml  Net -105 ml   Filed Weights   02/09/24 1722 02/09/24 2305  Weight: 64 kg 62.7 kg     Exam General exam: Appears calm and comfortable  Respiratory system: Clear to auscultation. Respiratory effort normal. Cardiovascular system: S1 &  S2 heard, irregularly irregular.  Gastrointestinal system: Abdomen is nondistended, soft and nontender.  Central nervous system: Alert and oriented.  Extremities: Symmetric 5 x 5 power. Skin: No rashes,    Data Reviewed:  I have personally reviewed following labs and imaging studies   CBC Lab Results  Component Value Date   WBC 6.3 02/10/2024   RBC 4.54 02/10/2024   HGB 13.7 02/10/2024   HCT 43.7 02/10/2024   MCV 96.3 02/10/2024   MCH 30.2 02/10/2024   PLT 107 (L) 02/10/2024   MCHC 31.4 02/10/2024   RDW 15.3 02/10/2024   LYMPHSABS 0.1 (L) 02/09/2024   MONOABS 0.3 02/09/2024   EOSABS 0.0 02/09/2024   BASOSABS 0.0 02/09/2024     Last metabolic panel Lab Results  Component Value Date   NA 138 02/10/2024   K 4.0 02/10/2024   CL 108 02/10/2024   CO2 21 (L) 02/10/2024   BUN 30 (H) 02/10/2024   CREATININE 1.25 (H) 02/10/2024   GLUCOSE 117 (H) 02/10/2024   GFRNONAA 54 (L) 02/10/2024   GFRAA >60 06/19/2020   CALCIUM 7.9 (L) 02/10/2024   PROT 6.9 05/04/2023   ALBUMIN 4.0 05/04/2023   BILITOT 0.4  05/04/2023   ALKPHOS 117 05/04/2023   AST 22 05/04/2023   ALT 24 05/04/2023   ANIONGAP 9 02/10/2024    CBG (last 3)  No results for input(s): "GLUCAP" in the last 72 hours.    Coagulation Profile: Recent Labs  Lab 02/10/24 0458  INR 1.2     Radiology Studies: ECHOCARDIOGRAM COMPLETE Result Date: 02/10/2024    ECHOCARDIOGRAM REPORT   Patient Name:   Donald Todd Date of Exam: 02/10/2024 Medical Rec #:  [id number]        Height:       67.0 in Accession #:    [id number]       Weight:       138.2 lb Date of Birth:  [birth year]        BSA:          1.728 m Patient Age:    89 years         BP:           132/80 mmHg Patient Gender: M                HR:           82 bpm. Exam Location:  Inpatient Procedure: 2D Echo, Cardiac Doppler, Color Doppler and Intracardiac            Opacification Agent (Both Spectral and Color Flow Doppler were            utilized during procedure). Indications:    I48.91* Unspeicified atrial fibrillation  History:        Patient has no prior history of Echocardiogram examinations.                 CAD, Abnormal ECG, Arrythmias:Atrial Fibrillation and                 Bradycardia, Signs/Symptoms:Syncope; Risk Factors:Hypertension.                 Metastatic cancer.  Sonographer:    Sheralyn Boatman RDCS Referring Phys: [id number] [facility] GOEL  Sonographer Comments: Technically difficult study due to poor echo windows. IMPRESSIONS  1. Global hypokinesis with abnormal septal motion. Left ventricular ejection fraction, by estimation, is 45 to 50%. The left ventricle has mildly decreased function. The left ventricle demonstrates global hypokinesis. Left  ventricular diastolic parameters are indeterminate.  2. Right ventricular systolic function is normal. The right ventricular size is normal. Tricuspid regurgitation signal is inadequate for assessing PA pressure. The estimated right ventricular systolic pressure is 16.7 mmHg.  3. The mitral valve is abnormal. Mild mitral valve regurgitation. No  evidence of mitral stenosis.  4. The aortic valve is tricuspid. There is moderate calcification of the aortic valve. There is moderate thickening of the aortic valve. Aortic valve regurgitation is mild. Aortic valve sclerosis is present, with no evidence of aortic valve stenosis.  5. The inferior vena cava is normal in size with greater than 50% respiratory variability, suggesting right atrial pressure of 3 mmHg.  6. Agitated saline contrast bubble study was negative, with no evidence of any interatrial shunt. FINDINGS  Left Ventricle: Global hypokinesis with abnormal septal motion. Left ventricular ejection fraction, by estimation, is 45 to 50%. The left ventricle has mildly decreased function. The left ventricle demonstrates global hypokinesis. Definity contrast agent was given IV to delineate the left ventricular endocardial borders. Strain was performed and the global longitudinal strain is indeterminate. The left ventricular internal cavity size was normal in size. There is no left ventricular hypertrophy. Left ventricular diastolic parameters are indeterminate. Right Ventricle: The right ventricular size is normal. No increase in right ventricular wall thickness. Right ventricular systolic function is normal. Tricuspid regurgitation signal is inadequate for assessing PA pressure. The tricuspid regurgitant velocity is 1.85 m/s, and with an assumed right atrial pressure of 3 mmHg, the estimated right ventricular systolic pressure is 16.7 mmHg. Left Atrium: Left atrial size was normal in size. Right Atrium: Right atrial size was normal in size. Pericardium: There is no evidence of pericardial effusion. Mitral Valve: The mitral valve is abnormal. There is mild thickening of the mitral valve leaflet(s). There is mild calcification of the mitral valve leaflet(s). Mild mitral valve regurgitation. No evidence of mitral valve stenosis. Tricuspid Valve: The tricuspid valve is normal in structure. Tricuspid valve  regurgitation is not demonstrated. No evidence of tricuspid stenosis. Aortic Valve: The aortic valve is tricuspid. There is moderate calcification of the aortic valve. There is moderate thickening of the aortic valve. Aortic valve regurgitation is mild. Aortic valve sclerosis is present, with no evidence of aortic valve stenosis. Pulmonic Valve: The pulmonic valve was normal in structure. Pulmonic valve regurgitation is not visualized. No evidence of pulmonic stenosis. Aorta: The aortic root is normal in size and structure. Venous: The inferior vena cava is normal in size with greater than 50% respiratory variability, suggesting right atrial pressure of 3 mmHg. IAS/Shunts: No atrial level shunt detected by color flow Doppler. Agitated saline contrast bubble study was negative, with no evidence of any interatrial shunt. Additional Comments: 3D was performed not requiring image post processing on an independent workstation and was indeterminate.  LEFT VENTRICLE PLAX 2D LVIDd:         4.10 cm LVIDs:         3.20 cm LV PW:         1.00 cm LV IVS:        1.10 cm LVOT diam:     2.00 cm LV SV:         39 LV SV Index:   23 LVOT Area:     3.14 cm  LV Volumes (MOD) LV vol d, MOD A2C: 55.7 ml LV vol d, MOD A4C: 91.1 ml LV vol s, MOD A2C: 35.4 ml LV vol s, MOD A4C: 51.6 ml LV SV MOD  A2C:     20.3 ml LV SV MOD A4C:     91.1 ml LV SV MOD BP:      29.6 ml RIGHT VENTRICLE             IVC RV S prime:     10.40 cm/s  IVC diam: 1.50 cm TAPSE (M-mode): 1.6 cm LEFT ATRIUM             Index        RIGHT ATRIUM           Index LA diam:        3.20 cm 1.85 cm/m   RA Area:     15.20 cm LA Vol (A2C):   30.6 ml 17.70 ml/m  RA Volume:   39.50 ml  22.85 ml/m LA Vol (A4C):   34.2 ml 19.79 ml/m LA Biplane Vol: 32.5 ml 18.80 ml/m  AORTIC VALVE LVOT Vmax:   78.65 cm/s LVOT Vmean:  52.050 cm/s LVOT VTI:    0.124 m  AORTA Ao Root diam: 3.10 cm Ao Asc diam:  3.10 cm MITRAL VALVE                TRICUSPID VALVE MV Area (PHT): 4.49 cm     TR Peak  grad:   13.7 mmHg MV Decel Time: 169 msec     TR Vmax:        185.00 cm/s MV E velocity: 118.00 cm/s                             SHUNTS                             Systemic VTI:  0.12 m                             Systemic Diam: 2.00 cm Charlton Haws MD Electronically signed by Charlton Haws MD Signature Date/Time: 02/10/2024/1:22:38 PM    Final        Kathlen Mody M.D. [facility] 02/10/2024, 1:33 PM  Available via Epic secure chat 7am-7pm After 7 pm, please refer to night coverage provider listed on amion.

## 2024-02-10 NOTE — Plan of Care (Signed)

## 2024-02-10 NOTE — Plan of Care (Signed)

## 2024-02-10 NOTE — Progress Notes (Signed)
 PHARMACY - ANTICOAGULATION CONSULT NOTE  Pharmacy Consult for Eliquis Indication: new onset atrial fibrillation  Allergies  Allergen Reactions   Tape Hives    Surgical tape - causes blisters   Hydrocodone     Shaking uncontrollably    Iohexol      Desc: PT DOES RECALL REACTION JUST SAYS IT WAS A LONG TIME AGO FOR KIDNEY X-RAYS AND HE DIDN'T TOLERATE IT WELL-ARS 05/02/09-NOTE E-CHART STATES A HOT FEELING IS HIS REACTION, BUT HE CAN'T CONFIRM THAT WAS ALL    Oxycodone Other (See Comments)    Shaking uncontrollably     Patient Measurements: Height: 5\' 7"  (170.2 cm) Weight: 62.7 kg (138 lb 3.7 oz) IBW/kg (Calculated) : 66.1 Heparin Dosing Weight:   Vital Signs: Temp: 97.6 F (36.4 C) (03/08 1502) Temp Source: Oral (03/08 1502) BP: 141/95 (03/08 1502) Pulse Rate: 75 (03/08 1502)  Labs: Recent Labs    02/09/24 1743 02/10/24 0458  HGB 16.0 13.7  HCT 51.0 43.7  PLT 128* 107*  APTT  --  30  LABPROT  --  15.7*  INR  --  1.2  CREATININE 1.35* 1.25*    Estimated Creatinine Clearance: 33.4 mL/min (A) (by C-G formula based on SCr of 1.25 mg/dL (H)).   Medical History: Past Medical History:  Diagnosis Date   Arthritis    CAD (coronary artery disease)    last cath. 07-09-2010   Cancer [facility])    kidney cancer , prostate cancer    Chronic kidney disease    lt kidnet removed   H/O cardiovascular stress test 08-04-2010   ef 55% NO ISCHEMIA   H/O unilateral nephrectomy    lt. nephrectomy   Heart attack (HCC) 1993   History of kidney stones    Hyperlipemia    Hypertension    renal dopplers 218.13-normal patency   Inguinal hernia 08/01/11   right   Macular degeneration    Prostate cancer [facility])    Prostate disease    Cancer S/P seed implant   Renal mass    Vertigo      Assessment: Patient is a 89 y.o M who presented to the ED on 02/09/24 with n/v/d. Pharmacy has been consulted on 02/10/24 to start Eliquis for new onset afib.  Today, 02/10/2024: - scr 1.25 - weight  63kg - plts low at 107K , hgb ok    Plan:  - d/c lovenox 40 mg SQ daily - start Eliquis 5mg  bid for weight >60 kg and scr <1.5  Mammie Meras P 02/10/2024,6:19 PM

## 2024-02-11 DIAGNOSIS — I4892 Unspecified atrial flutter: Secondary | ICD-10-CM | POA: Diagnosis not present

## 2024-02-11 DIAGNOSIS — I4891 Unspecified atrial fibrillation: Secondary | ICD-10-CM | POA: Diagnosis not present

## 2024-02-11 DIAGNOSIS — K529 Noninfective gastroenteritis and colitis, unspecified: Secondary | ICD-10-CM | POA: Diagnosis not present

## 2024-02-11 LAB — CBC WITH DIFFERENTIAL/PLATELET
Abs Immature Granulocytes: 0.02 10*3/uL (ref 0.00–0.07)
Basophils Absolute: 0 10*3/uL (ref 0.0–0.1)
Basophils Relative: 1 %
Eosinophils Absolute: 0.1 10*3/uL (ref 0.0–0.5)
Eosinophils Relative: 2 %
HCT: 41.8 % (ref 39.0–52.0)
Hemoglobin: 13.1 g/dL (ref 13.0–17.0)
Immature Granulocytes: 1 %
Lymphocytes Relative: 24 %
Lymphs Abs: 1 10*3/uL (ref 0.7–4.0)
MCH: 30.1 pg (ref 26.0–34.0)
MCHC: 31.3 g/dL (ref 30.0–36.0)
MCV: 96.1 fL (ref 80.0–100.0)
Monocytes Absolute: 0.5 10*3/uL (ref 0.1–1.0)
Monocytes Relative: 13 %
Neutro Abs: 2.4 10*3/uL (ref 1.7–7.7)
Neutrophils Relative %: 59 %
Platelets: 93 10*3/uL — ABNORMAL LOW (ref 150–400)
RBC: 4.35 MIL/uL (ref 4.22–5.81)
RDW: 15 % (ref 11.5–15.5)
WBC: 4.1 10*3/uL (ref 4.0–10.5)
nRBC: 0 % (ref 0.0–0.2)

## 2024-02-11 LAB — BASIC METABOLIC PANEL
Anion gap: 10 (ref 5–15)
BUN: 25 mg/dL — ABNORMAL HIGH (ref 8–23)
CO2: 20 mmol/L — ABNORMAL LOW (ref 22–32)
Calcium: 8.1 mg/dL — ABNORMAL LOW (ref 8.9–10.3)
Chloride: 111 mmol/L (ref 98–111)
Creatinine, Ser: 1.1 mg/dL (ref 0.61–1.24)
GFR, Estimated: >60 mL/min (ref >60)
Glucose, Bld: 82 mg/dL (ref 70–99)
Potassium: 3.9 mmol/L (ref 3.5–5.1)
Sodium: 141 mmol/L (ref 135–145)

## 2024-02-11 LAB — C DIFFICILE QUICK SCREEN W PCR REFLEX
C Diff antigen: NEGATIVE
C Diff interpretation: NOT DETECTED
C Diff toxin: NEGATIVE

## 2024-02-11 MED ORDER — METOPROLOL SUCCINATE ER 25 MG PO TB24
12.5000 mg | ORAL_TABLET | Freq: Once | ORAL | Status: AC
  Administered 2024-02-11: 12.5 mg via ORAL
  Filled 2024-02-11: qty 1

## 2024-02-11 MED ORDER — METOPROLOL SUCCINATE ER 25 MG PO TB24
25.0000 mg | ORAL_TABLET | Freq: Every day | ORAL | Status: DC
  Administered 2024-02-12: 25 mg via ORAL
  Filled 2024-02-11: qty 1

## 2024-02-11 MED ORDER — METOPROLOL TARTRATE 5 MG/5ML IV SOLN
5.0000 mg | Freq: Three times a day (TID) | INTRAVENOUS | Status: DC | PRN
  Administered 2024-02-11 – 2024-02-13 (×3): 5 mg via INTRAVENOUS
  Filled 2024-02-11 (×3): qty 5

## 2024-02-11 MED ORDER — METOPROLOL SUCCINATE ER 25 MG PO TB24: 25.0000 mg | ORAL_TABLET | Freq: Every day | ORAL | Status: DC

## 2024-02-11 NOTE — Progress Notes (Signed)
 [facility]                                                                               Donald Todd, is a 89 y.o. male, DOB - August 31, 1929, BMW:[id number] Admit date - 02/09/2024    Outpatient Primary MD for the patient is Donald Floro, MD  LOS - 2  days    Brief summary   Donald Todd is a 89 y.o. male with medical history significant of medical issues as listed below.  Patient lives at assisted living facility.  Patient advises that his wife recently suffered from vomiting and diarrhea and is actually admitted at [facility] long facility.   Unfortunately today patient had another episode of vomiting and 2 more episodes of watery bowel movement. Patient felt extremely weak/lightheaded and came to the ER.   Assessment & Plan    Assessment and Plan:   New onset atrial fibrillation:  Rate not well controlled.  Was on IV cardizem,, had 2 sec pause, discontinued it.  Restarted patient's metoprolol 25 mg daily.  Started the patient on Eliquis for anti coagulation.    Acute gastroenteritis Slowly improving. Stool to be sent for GI panel.  4 bowel movements so far.  Continue with symptomatic management.    BPH:  Resume flomax.      Estimated body mass index is 21.65 kg/m as calculated from the following:   Height as of this encounter: 5\' 7"  (1.702 m).   Weight as of this encounter: 62.7 kg.  Code Status: DNR DVT Prophylaxis:  SCDs Start: 02/09/24 2013 apixaban (ELIQUIS) tablet 5 mg   Level of Care: Level of care: Telemetry Family Communication: not at bedside.   Disposition Plan:     Remains inpatient appropriate:  pending clinical improvement.   Procedures:  echo  Consultants:   None.   Antimicrobials:   Anti-infectives (From admission, onward)    None        Medications  Scheduled Meds:  apixaban  5 mg Oral BID   [START ON 02/12/2024] metoprolol succinate  25 mg Oral Daily   sodium chloride flush  3 mL Intravenous Q12H    tamsulosin  0.4 mg Oral QPC supper   Continuous Infusions:  sodium chloride 75 mL/hr at 02/11/24 0930   PRN Meds:.acetaminophen **OR** acetaminophen, metoprolol tartrate, polyethylene glycol    Subjective:   Donald Todd was seen and examined today.  4 bowel  movements so far.   Objective:   Vitals:   02/10/24 1502 02/10/24 2146 02/11/24 0438 02/11/24 1326  BP: (!) 141/95 (!) 159/88 (!) 151/99 (!) 134/94  Pulse: 75 89 100 93  Resp: 18 18 20 17   Temp: 97.6 F (36.4 C) 98.1 F (36.7 C) 97.8 F (36.6 C) 98.2 F (36.8 C)  TempSrc: Oral Oral Oral Oral  SpO2: 100% 98% 97% 100%  Weight:      Height:        Intake/Output Summary (Last 24 hours) at 02/11/2024 1716 Last data filed at 02/11/2024 0930 Gross per 24 hour  Intake 2826.3 ml  Output 501 ml  Net 2325.3 ml   Filed Weights   02/09/24 1722 02/09/24 2305  Weight:  64 kg 62.7 kg     General exam: Appears calm and comfortable  Respiratory system: Clear to auscultation. Respiratory effort normal. Cardiovascular system: S1 & S2 heard, irregularly irregular.  Gastrointestinal system: Abdomen is nondistended, soft and nontender.  Central nervous system: Alert and oriented. No focal neurological deficits. Extremities: Symmetric 5 x 5 power. Skin: No rashes, lesions or ulcers Psychiatry: Mood & affect appropriate.     Data Reviewed:  I have personally reviewed following labs and imaging studies   CBC Lab Results  Component Value Date   WBC 4.1 02/11/2024   RBC 4.35 02/11/2024   HGB 13.1 02/11/2024   HCT 41.8 02/11/2024   MCV 96.1 02/11/2024   MCH 30.1 02/11/2024   PLT 93 (L) 02/11/2024   MCHC 31.3 02/11/2024   RDW 15.0 02/11/2024   LYMPHSABS 1.0 02/11/2024   MONOABS 0.5 02/11/2024   EOSABS 0.1 02/11/2024   BASOSABS 0.0 02/11/2024     Last metabolic panel Lab Results  Component Value Date   NA 141 02/11/2024   K 3.9 02/11/2024   CL 111 02/11/2024   CO2 20 (L) 02/11/2024   BUN 25 (H) 02/11/2024    CREATININE 1.10 02/11/2024   GLUCOSE 82 02/11/2024   GFRNONAA >60 02/11/2024   GFRAA >60 06/19/2020   CALCIUM 8.1 (L) 02/11/2024   PROT 6.9 05/04/2023   ALBUMIN 4.0 05/04/2023   BILITOT 0.4 05/04/2023   ALKPHOS 117 05/04/2023   AST 22 05/04/2023   ALT 24 05/04/2023   ANIONGAP 10 02/11/2024    CBG (last 3)  No results for input(s): "GLUCAP" in the last 72 hours.    Coagulation Profile: Recent Labs  Lab 02/10/24 0458  INR 1.2     Radiology Studies: ECHOCARDIOGRAM COMPLETE Result Date: 02/10/2024    ECHOCARDIOGRAM REPORT   Patient Name:   Donald Todd Date of Exam: 02/10/2024 Medical Rec #:  [id number]        Height:       67.0 in Accession #:    [id number]       Weight:       138.2 lb Date of Birth:  21-Mar-1929        BSA:          1.728 m Patient Age:    89 years         BP:           132/80 mmHg Patient Gender: M                HR:           82 bpm. Exam Location:  Inpatient Procedure: 2D Echo, Cardiac Doppler, Color Doppler and Intracardiac            Opacification Agent (Both Spectral and Color Flow Doppler were            utilized during procedure). Indications:    I48.91* Unspeicified atrial fibrillation  History:        Patient has no prior history of Echocardiogram examinations.                 CAD, Abnormal ECG, Arrythmias:Atrial Fibrillation and                 Bradycardia, Signs/Symptoms:Syncope; Risk Factors:Hypertension.                 Metastatic cancer.  Sonographer:    Sheralyn Boatman RDCS Referring Phys: [id number] [facility] GOEL  Sonographer Comments: Technically difficult study due to poor  echo windows. IMPRESSIONS  1. Global hypokinesis with abnormal septal motion. Left ventricular ejection fraction, by estimation, is 45 to 50%. The left ventricle has mildly decreased function. The left ventricle demonstrates global hypokinesis. Left ventricular diastolic parameters are indeterminate.  2. Right ventricular systolic function is normal. The right ventricular size is normal. Tricuspid  regurgitation signal is inadequate for assessing PA pressure. The estimated right ventricular systolic pressure is 16.7 mmHg.  3. The mitral valve is abnormal. Mild mitral valve regurgitation. No evidence of mitral stenosis.  4. The aortic valve is tricuspid. There is moderate calcification of the aortic valve. There is moderate thickening of the aortic valve. Aortic valve regurgitation is mild. Aortic valve sclerosis is present, with no evidence of aortic valve stenosis.  5. The inferior vena cava is normal in size with greater than 50% respiratory variability, suggesting right atrial pressure of 3 mmHg.  6. Agitated saline contrast bubble study was negative, with no evidence of any interatrial shunt. FINDINGS  Left Ventricle: Global hypokinesis with abnormal septal motion. Left ventricular ejection fraction, by estimation, is 45 to 50%. The left ventricle has mildly decreased function. The left ventricle demonstrates global hypokinesis. Definity contrast agent was given IV to delineate the left ventricular endocardial borders. Strain was performed and the global longitudinal strain is indeterminate. The left ventricular internal cavity size was normal in size. There is no left ventricular hypertrophy. Left ventricular diastolic parameters are indeterminate. Right Ventricle: The right ventricular size is normal. No increase in right ventricular wall thickness. Right ventricular systolic function is normal. Tricuspid regurgitation signal is inadequate for assessing PA pressure. The tricuspid regurgitant velocity is 1.85 m/s, and with an assumed right atrial pressure of 3 mmHg, the estimated right ventricular systolic pressure is 16.7 mmHg. Left Atrium: Left atrial size was normal in size. Right Atrium: Right atrial size was normal in size. Pericardium: There is no evidence of pericardial effusion. Mitral Valve: The mitral valve is abnormal. There is mild thickening of the mitral valve leaflet(s). There is mild  calcification of the mitral valve leaflet(s). Mild mitral valve regurgitation. No evidence of mitral valve stenosis. Tricuspid Valve: The tricuspid valve is normal in structure. Tricuspid valve regurgitation is not demonstrated. No evidence of tricuspid stenosis. Aortic Valve: The aortic valve is tricuspid. There is moderate calcification of the aortic valve. There is moderate thickening of the aortic valve. Aortic valve regurgitation is mild. Aortic valve sclerosis is present, with no evidence of aortic valve stenosis. Pulmonic Valve: The pulmonic valve was normal in structure. Pulmonic valve regurgitation is not visualized. No evidence of pulmonic stenosis. Aorta: The aortic root is normal in size and structure. Venous: The inferior vena cava is normal in size with greater than 50% respiratory variability, suggesting right atrial pressure of 3 mmHg. IAS/Shunts: No atrial level shunt detected by color flow Doppler. Agitated saline contrast bubble study was negative, with no evidence of any interatrial shunt. Additional Comments: 3D was performed not requiring image post processing on an independent workstation and was indeterminate.  LEFT VENTRICLE PLAX 2D LVIDd:         4.10 cm LVIDs:         3.20 cm LV PW:         1.00 cm LV IVS:        1.10 cm LVOT diam:     2.00 cm LV SV:         39 LV SV Index:   23 LVOT Area:     3.14 cm  LV Volumes (MOD) LV vol d, MOD A2C: 55.7 ml LV vol d, MOD A4C: 91.1 ml LV vol s, MOD A2C: 35.4 ml LV vol s, MOD A4C: 51.6 ml LV SV MOD A2C:     20.3 ml LV SV MOD A4C:     91.1 ml LV SV MOD BP:      29.6 ml RIGHT VENTRICLE             IVC RV S prime:     10.40 cm/s  IVC diam: 1.50 cm TAPSE (M-mode): 1.6 cm LEFT ATRIUM             Index        RIGHT ATRIUM           Index LA diam:        3.20 cm 1.85 cm/m   RA Area:     15.20 cm LA Vol (A2C):   30.6 ml 17.70 ml/m  RA Volume:   39.50 ml  22.85 ml/m LA Vol (A4C):   34.2 ml 19.79 ml/m LA Biplane Vol: 32.5 ml 18.80 ml/m  AORTIC VALVE LVOT  Vmax:   78.65 cm/s LVOT Vmean:  52.050 cm/s LVOT VTI:    0.124 m  AORTA Ao Root diam: 3.10 cm Ao Asc diam:  3.10 cm MITRAL VALVE                TRICUSPID VALVE MV Area (PHT): 4.49 cm     TR Peak grad:   13.7 mmHg MV Decel Time: 169 msec     TR Vmax:        185.00 cm/s MV E velocity: 118.00 cm/s                             SHUNTS                             Systemic VTI:  0.12 m                             Systemic Diam: 2.00 cm Charlton Haws MD Electronically signed by Charlton Haws MD Signature Date/Time: 02/10/2024/1:22:38 PM    Final        Kathlen Mody M.D. [facility] 02/11/2024, 5:16 PM  Available via Epic secure chat 7am-7pm After 7 pm, please refer to night coverage provider listed on amion.

## 2024-02-11 NOTE — Plan of Care (Signed)

## 2024-02-11 NOTE — Evaluation (Signed)
 Physical Therapy Evaluation Patient Details Name: Donald Todd MRN: [id number] DOB: 24-May-1929 Today's Date: 02/11/2024  History of Present Illness  89 yo male admitted with afib, acute gastroenteritis. Hx of vertigo, prostate Ca, macular degeneration, CAD,m CKD, L rTSA, COVID, met renal cell Ca  Clinical Impression  On eval, pt was CGA for mobility. He walked ~40 feet with a RW. Pt tolerated activity well. He reports he has been assisting himself on/off bsc 2* still having diarrhea. Pt reports his wife is also a [facility] patient at this time. Will plan to follow and progress activity as tolerated. Recommend HHPT f/u.        If plan is discharge home, recommend the following: A little help with walking and/or transfers;A little help with bathing/dressing/bathroom;Assistance with cooking/housework;Assist for transportation;Help with stairs or ramp for entrance   Can travel by private vehicle        Equipment Recommendations None recommended by PT  Recommendations for Other Services       Functional Status Assessment Patient has had a recent decline in their functional status and demonstrates the ability to make significant improvements in function in a reasonable and predictable amount of time.     Precautions / Restrictions Precautions Precautions: Fall Restrictions Weight Bearing Restrictions Per Provider Order: No      Mobility  Bed Mobility Overal bed mobility: Modified Independent                  Transfers Overall transfer level: Modified independent                      Ambulation/Gait Ambulation/Gait assistance: Contact guard assist Gait Distance (Feet): 40 Feet Assistive device: Rolling walker (2 wheels) Gait Pattern/deviations: Step-through pattern, Decreased stride length       General Gait Details: No LOB with RW use. No dizziness reported.  Stairs            Wheelchair Mobility     Tilt Bed    Modified Rankin (Stroke Patients  Only)       Balance                                             Pertinent Vitals/Pain Pain Assessment Pain Assessment: No/denies pain    Home Living Family/patient expects to be discharged to:: Assisted living                 Home Equipment: Rollator (4 wheels)      Prior Function Prior Level of Function : Independent/Modified Independent                     Extremity/Trunk Assessment   Upper Extremity Assessment Upper Extremity Assessment: Defer to OT evaluation    Lower Extremity Assessment Lower Extremity Assessment: Generalized weakness    Cervical / Trunk Assessment Cervical / Trunk Assessment: Kyphotic  Communication   Communication Communication: No apparent difficulties    Cognition Arousal: Alert Behavior During Therapy: WFL for tasks assessed/performed   PT - Cognitive impairments: No apparent impairments                         Following commands: Intact       Cueing Cueing Techniques: Verbal cues     General Comments      Exercises     Assessment/Plan  PT Assessment Patient needs continued PT services  PT Problem List Decreased strength;Decreased activity tolerance;Decreased balance;Decreased mobility;Decreased knowledge of use of DME       PT Treatment Interventions DME instruction;Gait training;Functional mobility training;Therapeutic activities;Therapeutic exercise;Patient/family education;Balance training    PT Goals (Current goals can be found in the Care Plan section)  Acute Rehab PT Goals Patient Stated Goal: to feel better PT Goal Formulation: With patient Time For Goal Achievement: 02/25/24 Potential to Achieve Goals: Good    Frequency Min 2X/week     Co-evaluation               AM-PAC PT "6 Clicks" Mobility  Outcome Measure Help needed turning from your back to your side while in a flat bed without using bedrails?: None Help needed moving from lying on your back  to sitting on the side of a flat bed without using bedrails?: None Help needed moving to and from a bed to a chair (including a wheelchair)?: None Help needed standing up from a chair using your arms (e.g., wheelchair or bedside chair)?: None Help needed to walk in hospital room?: A Little Help needed climbing 3-5 steps with a railing? : A Little 6 Click Score: 22    End of Session   Activity Tolerance: Patient tolerated treatment well Patient left: in bed;with call bell/phone within reach   PT Visit Diagnosis: Muscle weakness (generalized) (M62.81)    Time: 1450-1511 PT Time Calculation (min) (ACUTE ONLY): 21 min   Charges:       PT General Charges $$ ACUTE PT VISIT: 1 Visit            Faye Ramsay, PT Acute Rehabilitation  Office: [phone]

## 2024-02-12 DIAGNOSIS — K529 Noninfective gastroenteritis and colitis, unspecified: Secondary | ICD-10-CM | POA: Diagnosis not present

## 2024-02-12 DIAGNOSIS — I4892 Unspecified atrial flutter: Secondary | ICD-10-CM | POA: Diagnosis not present

## 2024-02-12 DIAGNOSIS — I4891 Unspecified atrial fibrillation: Secondary | ICD-10-CM | POA: Diagnosis not present

## 2024-02-12 DIAGNOSIS — I4819 Other persistent atrial fibrillation: Secondary | ICD-10-CM | POA: Diagnosis not present

## 2024-02-12 LAB — GASTROINTESTINAL PANEL BY PCR, STOOL (REPLACES STOOL CULTURE)

## 2024-02-12 LAB — URINALYSIS, W/ REFLEX TO CULTURE (INFECTION SUSPECTED)
Bacteria, UA: NONE SEEN
Bilirubin Urine: NEGATIVE
Glucose, UA: NEGATIVE mg/dL
Hgb urine dipstick: NEGATIVE
Ketones, ur: 5 mg/dL — AB
Leukocytes,Ua: NEGATIVE
Nitrite: NEGATIVE
Protein, ur: NEGATIVE mg/dL
Specific Gravity, Urine: 1.011 (ref 1.005–1.030)
pH: 5 (ref 5.0–8.0)

## 2024-02-12 MED ORDER — QUETIAPINE FUMARATE 25 MG PO TABS
25.0000 mg | ORAL_TABLET | Freq: Every day | ORAL | Status: DC
  Administered 2024-02-12 – 2024-02-14 (×3): 25 mg via ORAL
  Filled 2024-02-12 (×3): qty 1

## 2024-02-12 MED ORDER — LORATADINE 10 MG PO TABS
10.0000 mg | ORAL_TABLET | Freq: Every day | ORAL | Status: DC
  Administered 2024-02-12 – 2024-02-15 (×4): 10 mg via ORAL
  Filled 2024-02-12 (×4): qty 1

## 2024-02-12 MED ORDER — ATORVASTATIN CALCIUM 20 MG PO TABS
20.0000 mg | ORAL_TABLET | Freq: Every day | ORAL | Status: DC
  Administered 2024-02-13 – 2024-02-14 (×2): 20 mg via ORAL
  Filled 2024-02-12 (×3): qty 1

## 2024-02-12 MED ORDER — PROSIGHT PO TABS
1.0000 | ORAL_TABLET | Freq: Two times a day (BID) | ORAL | Status: DC
  Administered 2024-02-12 – 2024-02-15 (×7): 1 via ORAL
  Filled 2024-02-12 (×7): qty 1

## 2024-02-12 MED ORDER — HALOPERIDOL LACTATE 5 MG/ML IJ SOLN
1.0000 mg | Freq: Once | INTRAMUSCULAR | Status: AC
  Administered 2024-02-12: 1 mg via INTRAVENOUS
  Filled 2024-02-12: qty 1

## 2024-02-12 MED ORDER — METOPROLOL SUCCINATE ER 50 MG PO TB24
50.0000 mg | ORAL_TABLET | Freq: Every day | ORAL | Status: DC
  Administered 2024-02-13: 50 mg via ORAL
  Filled 2024-02-12: qty 1

## 2024-02-12 NOTE — Progress Notes (Signed)
 Patient is increasingly more confused and having visual hallucinations. Refused evening medication. MD made aware. Discussed patient's confusion and hallucinations with son Donald Todd earlier in shift while he was visiting patient. Per son Donald Todd these behaviors are not new in nature. UA and CS was ordered by attending MD to ensure patient does not have any underlying factors for confusion.

## 2024-02-12 NOTE — TOC Initial Note (Signed)
 Transition of Care [facility]) - Initial/Assessment Note    Patient Details  Name: Donald Todd MRN: [id number] Date of Birth: 06-21-1929  [facility] CM/SW Contact:    Larrie Kass, LCSW Phone Number: 02/12/2024, 10:07 AM  Clinical Narrative:                 Pt is a resident at [facility] facility. Pt FL2 will need to be faxed for review prior to d/c. TOC to follow.  Expected Discharge Plan: Assisted Living Barriers to Discharge: Continued Medical Work up   Patient Goals and CMS Choice            Expected Discharge Plan and Services                                              Prior Living Arrangements/Services                       Activities of Daily Living   ADL Screening (condition at time of admission) Independently performs ADLs?: Yes (appropriate for developmental age) Is the patient deaf or have difficulty hearing?: No Does the patient have difficulty seeing, even when wearing glasses/contacts?: Yes Does the patient have difficulty concentrating, remembering, or making decisions?: No  Permission Sought/Granted                  Emotional Assessment              Admission diagnosis:  A-fib (HCC) [I48.91] Norovirus [A08.11] Atrial flutter with rapid ventricular response (HCC) [I48.92] Patient Active Problem List   Diagnosis Date Noted   A-fib (HCC) 02/09/2024   Gastroenteritis 02/09/2024   Right wrist pain 02/21/2023   UTI (urinary tract infection) 02/21/2023   Cough, persistent post Covid 19 infecton 02/21/2023   Constipation 02/21/2023   Urinary retention 02/21/2023   Other specified arthritis, 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   Debility 02/10/2023   COVID-19 02/04/2023   COVID-19 virus infection 02/03/2023   Bradycardia with 51-60 beats per minute 02/03/2023   Thrombocytopenia (HCC) 02/03/2023   Metastatic renal cell carcinoma to intra-abdominal site  (HCC) 03/24/2021   Malignant neoplasm metastatic to right lung (HCC) 03/23/2021   Vertigo 06/18/2020   S/P shoulder replacement, left 05/26/2017   Syncope and collapse 04/03/2017   Abnormality of gait 09/07/2016   History of nephrectomy- Lt 06/16/2014   Chronic renal impairment, stage 3 (moderate) (HCC) 06/16/2014   CAD S/P percutaneous coronary angioplasty 05/15/2013   Essential hypertension 05/15/2013   Dyslipidemia 05/15/2013   PCP:  Daisy Floro, MD Pharmacy:   [id number] - [place], [place] - [address] [place] [address] Phone: [phone] Fax: [phone]  [facility] - [place], [place] - [address] [place] [address] Phone: [phone] Fax: [phone]     Social Drivers of Health (SDOH) Social History: SDOH Screenings   Food Insecurity: No Food Insecurity (02/09/2024)  Housing: Low Risk  (02/10/2024)  Transportation Needs: No Transportation Needs (02/09/2024)  Utilities: Not At Risk (02/09/2024)  Depression (PHQ2-9): Low Risk  (04/04/2023)  Social Connections: Moderately Integrated (02/09/2024)  Tobacco Use: Medium Risk (02/10/2024)   SDOH Interventions:  Readmission Risk Interventions     No data to display

## 2024-02-12 NOTE — Progress Notes (Signed)
 Mobility Specialist - Progress Note   02/12/24 1248  Mobility  Activity Ambulated with assistance in hallway  Level of Assistance Standby assist, set-up cues, supervision of patient - no hands on  Assistive Device Front wheel walker  Distance Ambulated (ft) 200 ft  Activity Response Tolerated well  Mobility Referral Yes  Mobility visit 1 Mobility  Mobility Specialist Start Time (ACUTE ONLY) 1237  Mobility Specialist Stop Time (ACUTE ONLY) 1245  Mobility Specialist Time Calculation (min) (ACUTE ONLY) 8 min   Pt received in bed and agreeable to mobility. No complaints during session. Pt to bed after session with all needs met. Bed alarm on.    [facility]

## 2024-02-12 NOTE — Consult Note (Addendum)
 Cardiology Consultation   Patient ID: RIELY BASKETT MRN: [id number]; DOB: [birth year]  Admit date: 02/09/2024 Date of Consult: 02/12/2024  PCP:  Daisy Floro, MD   [facility] Providers Cardiologist:  Nanetta Batty, MD   {   Patient Profile:   KARY COLAIZZI is a 89 y.o. male with a hx of CAD s/p DES,, hypertension, dyslipidemia of who is being seen 02/12/2024 for the evaluation of atrial fibrillation at the request of Dr. Blake Divine.  History of Present Illness:   Mr. Fieldhouse is 9 YOM with the above  medical history. Patient was last seen by cardiology 01/05/2024 with Dr. Allyson Sabal for annual follow-up visit.  At that time, he denied any CP or SOB. EKG: Atrial flutter with variable A-V block Left axis deviation. At this time, he was not anticoagulated.   Presented to ED 02/09/2024 for new onset diarrhea with x 3 loose stools and 2 episodes of vomiting.  Associated with extreme weakness and lightheadedness.  Denies fever, abdominal pain, chest pain or shortness of breath. On examination in the ER patient was noted to be in atrial fibrillation with RVR.  He has been treated with fluids, IV diltiazem and Eliquis.   CMP unremarkable except Cr 1.35, BUN 30, Ca 8.4, Gluc  161.  K 4.8, MG 2.1, TSH 1.208 --> All WNL EKG: Atrial Flutter, HR 99, Q waves in inferior leads and V2-4 (similar to previous EKG 01/05/2024) ECHO 02/10/24: Global hypokinesis with abnormal septal motion.  EF 45 to 50%.  LV mildly decreased function.  LV global hypokinesis.  RV SP 16.7 mmHg.  Mild MVR.  Mild AVR.  Moderate and thickening of aortic valve.  Negative bubble study.   Cardiology was consulted for new onset A-fib. Patient limited as a historian. Since, he stays in assisted living he does not manage his medications and  can't tell me any of th medications. Also, he forgot that he saw Dr. Allyson Sabal a few months ago. He does not know if he has ever had Afib or flutter before but it sounds familiar to him. On my  interview, he admits to ongoing dizziness and palpitation for months that happened intermittently. It occurs at rest, walking and changing positions. He reported these symptoms were present  for months prior to vomiting and diarrhea. Also reported some dull chest pain that occurs with exertion, stress or rest. Sometimes relieved with laying down. Denies any associated diaphoresis or SOB. Troponin negative x 2 today 8>8  Patient lives at assisted living facility.  His wife is also actually admitted at [facility] facility.  He is a previous smoker that quit about 60 years ago with 3.3-pack-year smoking history.  Denies any alcohol or drugs.   Past Medical History:  Diagnosis Date   Arthritis    CAD (coronary artery disease)    last cath. 07-09-2010   Cancer [facility])    kidney cancer , prostate cancer    Chronic kidney disease    lt kidnet removed   H/O cardiovascular stress test 08-04-2010   ef 55% NO ISCHEMIA   H/O unilateral nephrectomy    lt. nephrectomy   Heart attack (HCC) 1993   History of kidney stones    Hyperlipemia    Hypertension    renal dopplers 218.13-normal patency   Inguinal hernia 08/01/11   right   Macular degeneration    Prostate cancer [facility])    Prostate disease    Cancer S/P seed implant   Renal mass  Vertigo     Past Surgical History:  Procedure Laterality Date   ANGIOPLASTY  1993   athroscopic knee surgery  2002   right   bone scan  2005   CARDIAC CATHETERIZATION  07-09-2010   normal left ventricular function, coronary obsstructive disease with 20% proximal an 30-40% mid lt. anterior descending narrowing ; widely patent stent in the proximal ramus intermediate vessel; 50-70-% narrowing in the mid circumflex,and widely patent stent  in the rt. coronary    CARDIAC CATHETERIZATION  12-22-2003   widely patent stents with noncritical coronary artery disease and normalLV function   CHOLECYSTECTOMY  05/05/2009   Laparoscopic   colonscopy  2004   with biopsy   CORONARY  ANGIOPLASTY WITH STENT PLACEMENT  03-15-1996   pci to rci and ramus branch using j&j [id number] stent,post dialated  at  20atm (rca)  and 14 atm (ramus branch. ef 55%             HERNIA REPAIR  08/01/11   RIH   INGUINAL HERNIA REPAIR Left 03/29/2013   Procedure: HERNIA REPAIR INGUINAL ADULT;  Surgeon: Mariella Saa, MD;  Location: [facility] ORS;  Service: General;  Laterality: Left;   INSERTION OF MESH Left 03/29/2013   Procedure: INSERTION OF MESH;  Surgeon: Mariella Saa, MD;  Location: [facility] ORS;  Service: General;  Laterality: Left;   JOINT REPLACEMENT  2006 and 2010   left 2006, right 2010   KIDNEY SURGERY  2002 and 2005   2002 - partial removal of left. 2005 complete removal   PROSTATE BIOPSY     prostate seed implant     radiation treatment  2002   prostate   REVERSE SHOULDER ARTHROPLASTY Left 05/26/2017   Procedure: REVERSE LEFT TOTAL SHOULDER ARTHROPLASTY;  Surgeon: Beverely Low, MD;  Location: [facility] OR;  Service: Orthopedics;  Laterality: Left;   stent implants  1997     Home Medications:  Prior to Admission medications   Medication Sig Start Date End Date Taking? Authorizing Provider  acetaminophen (TYLENOL) 325 MG tablet Take 1-2 tablets (325-650 mg total) by mouth every 4 (four) hours as needed for mild pain. 02/21/23  Yes Love, Evlyn Kanner, PA-C  amLODipine (NORVASC) 2.5 MG tablet Take 1 tablet (2.5 mg total) by mouth daily. 01/10/24 01/09/25 Yes Runell Gess, MD  aspirin 81 MG tablet Take 81 mg by mouth at bedtime.   Yes [provider]  atorvastatin (LIPITOR) 20 MG tablet Take 20 mg by mouth daily.   Yes [provider]  docusate sodium (COLACE) 100 MG capsule Take 1 capsule (100 mg total) by mouth every 12 (twelve) hours. Patient taking differently: Take 200 mg by mouth daily. 03/10/21  Yes Dione Booze, MD  enalapril (VASOTEC) 5 MG tablet Take 1 tablet (5 mg total) by mouth daily. 01/10/24  Yes Runell Gess, MD  loperamide (IMODIUM A-D) 2 MG tablet Take 2 mg by  mouth every 6 (six) hours as needed for diarrhea or loose stools.   Yes [provider]  loratadine (CLARITIN) 10 MG tablet Take 10 mg by mouth daily. 09/29/21  Yes [provider]  meclizine (ANTIVERT) 25 MG tablet Take 25 mg by mouth as needed for dizziness.   Yes [provider]  metoprolol succinate (TOPROL-XL) 25 MG 24 hr tablet Take 25 mg by mouth daily. 05/03/23  Yes [provider]  Multiple Vitamins-Minerals (PRESERVISION AREDS 2) CAPS Take 1 capsule by mouth 2 (two) times daily.   Yes [provider]  polyethylene glycol (MIRALAX / GLYCOLAX) 17 g packet Take 17 g by mouth daily. 03/10/21  Yes Dione Booze, MD  predniSONE (DELTASONE) 10 MG tablet Take 10 mg by mouth every morning.   Yes [provider]  QUEtiapine (SEROQUEL) 25 MG tablet Take 1 tablet (25 mg total) by mouth at bedtime. 02/21/23  Yes Love, Evlyn Kanner, PA-C  atorvastatin (LIPITOR) 40 MG tablet TAKE 1 TABLET AT BEDTIME Patient not taking: Reported on 02/09/2024 03/07/23   Runell Gess, MD  calcium carbonate (TUMS - DOSED IN MG ELEMENTAL CALCIUM) 500 MG chewable tablet Chew 1,000 mg by mouth daily as needed for indigestion or heartburn. Patient not taking: Reported on 01/05/2024    [provider]  ipratropium (ATROVENT) 0.03 % nasal spray Place 1-2 sprays into both nostrils 2 (two) times daily as needed (allergies). Patient not taking: Reported on 02/09/2024 05/29/20   [provider]  nitroGLYCERIN (NITROSTAT) 0.4 MG SL tablet Place 1 tablet (0.4 mg total) under the tongue as needed. 10/05/23   Runell Gess, MD  tamsulosin (FLOMAX) 0.4 MG CAPS capsule Take 1-2 capsules (0.4-0.8 mg total) by mouth daily after supper. 04/04/23   Horton Chin, MD    Inpatient Medications: Scheduled Meds:  apixaban  5 mg Oral BID   atorvastatin  20 mg Oral q1800   loratadine  10 mg Oral Daily   metoprolol succinate  25 mg Oral Daily   multivitamin  1 tablet Oral BID    QUEtiapine  25 mg Oral QHS   sodium chloride flush  3 mL Intravenous Q12H   tamsulosin  0.4 mg Oral QPC supper   Continuous Infusions:  PRN Meds: acetaminophen **OR** acetaminophen, metoprolol tartrate  Allergies:    Allergies  Allergen Reactions   Tape Hives    Surgical tape - causes blisters   Hydrocodone     Shaking uncontrollably    Iohexol      Desc: PT DOES RECALL REACTION JUST SAYS IT WAS A LONG TIME AGO FOR KIDNEY X-RAYS AND HE DIDN'T TOLERATE IT WELL-ARS 05/02/09-NOTE E-CHART STATES A HOT FEELING IS HIS REACTION, BUT HE CAN'T CONFIRM THAT WAS ALL    Oxycodone Other (See Comments)    Shaking uncontrollably     Social History:   Social History   Socioeconomic History   Marital status: Married    Spouse name: Not on file   Number of children: 5   Years of education: 12   Highest education level: Not on file  Occupational History   Occupation: Retired    Comment: Librarian, academic, Presenter, 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  Tobacco Use   Smoking status: Former    Current packs/day: 0.00    Average packs/day: 0.3 packs/day for 13.0 years (3.3 ttl pk-yrs)    Types: Cigarettes, Cigars    Start date: 12/13/1950    Quit date: 12/14/1963    Years since quitting: 60.2   Smokeless tobacco: Never  Vaping Use   Vaping status: Never Used  Substance and Sexual Activity   Alcohol use: No   Drug use: No   Sexual activity: Not Currently  Other Topics Concern   Not on file  Social History Narrative   Lives at home w/ his wife   Right-handed   Caffeine: 2 cups of coffee per day, rare soft drink   Social Drivers of Corporate investment banker Strain: Not on file  Food Insecurity: No Food Insecurity (02/09/2024)   Hunger Vital Sign    Worried About  Running Out of Food in the Last Year: Never true    Ran Out of Food in the Last Year: Never true  Transportation Needs: No Transportation Needs (02/09/2024)   PRAPARE - Administrator, Civil Service (Medical): No    Lack of Transportation (Non-Medical):  No  Physical Activity: Not on file  Stress: Not on file  Social Connections: Moderately Integrated (02/09/2024)   Social Connection and Isolation Panel [NHANES]    Frequency of Communication with Friends and Family: Three times a week    Frequency of Social Gatherings with Friends and Family: Three times a week    Attends Religious Services: 1 to 4 times per year    Active Member of Clubs or Organizations: No    Attends Banker Meetings: Never    Marital Status: Married  Catering manager Violence: Not At Risk (02/09/2024)   Humiliation, Afraid, Rape, and Kick questionnaire    Fear of Current or Ex-Partner: No    Emotionally Abused: No    Physically Abused: No    Sexually Abused: No    Family History:   Family History  Problem Relation Age of Onset   Cancer Mother        stomach?   Stroke Father    Stroke Brother    Skin cancer Brother      ROS:  Please see the history of present illness.  All other ROS reviewed and negative.     Physical Exam/Data:   Vitals:   02/11/24 1326 02/11/24 2134 02/12/24 0557 02/12/24 0646  BP: (!) 134/94 (!) 140/94 (!) 172/99 (!) 154/94  Pulse: 93 (!) 57 64 68  Resp: 17 20 19    Temp: 98.2 F (36.8 C) 98.2 F (36.8 C) 98.2 F (36.8 C)   TempSrc: Oral Oral Oral   SpO2: 100% 96% 98%   Weight:      Height:        Intake/Output Summary (Last 24 hours) at 02/12/2024 1346 Last data filed at 02/12/2024 1339 Gross per 24 hour  Intake 2567.62 ml  Output 400 ml  Net 2167.62 ml      02/09/2024   11:05 PM 02/09/2024    5:22 PM 01/05/2024   12:09 PM  Last 3 Weights  Weight (lbs) 138 lb 3.7 oz 141 lb 1.5 oz 142 lb  Weight (kg) 62.7 kg 64 kg 64.411 kg     Body mass index is 21.65 kg/m.  General:  Well nourished, well developed, in no acute distress HEENT: normal Neck: no JVD Vascular: No carotid bruits; Distal pulses 2+ bilaterally Cardiac:  irregularly, irregular ; no murmur  Lungs:  clear to auscultation bilaterally, no wheezing,  rhonchi or rales  Abd: soft, nontender, no hepatomegaly  Ext: trace edema.  Musculoskeletal:  No deformities, BUE and BLE strength normal and equal Skin: warm and dry  Neuro:  CNs 2-12 intact, no focal abnormalities noted Psych:  Normal affect   EKG:  The EKG was personally reviewed and demonstrates:  Atrial Flutter, HR 99, Q waves in inferior leads and V2-4 (similar to previous EKG 01/05/2024) Telemetry:  Telemetry was personally reviewed and demonstrates:  Atrial flutter with variable block, HR 90-120  Relevant CV Studies: Lexiscan 05/07/2020 The left ventricular ejection fraction is mildly decreased (45-54%). Nuclear stress EF: 53%. There was no ST segment deviation noted during stress. Defect 1: There is a medium defect of moderate severity. This is a low risk study. Findings consistent with prior myocardial infarction with peri-infarct  ischemia.   Abnormal, low risk stress nuclear study with prior lateral infarct and minimal peri-infarct ischemia.  Gated ejection fraction 53% with hypokinesis of the lateral wall  ECHO 02/10/2024 IMPRESSIONS   1. Global hypokinesis with abnormal septal motion. Left ventricular  ejection fraction, by estimation, is 45 to 50%. The left ventricle has  mildly decreased function. The left ventricle demonstrates global  hypokinesis. Left ventricular diastolic  parameters are indeterminate.   2. Right ventricular systolic function is normal. The right ventricular  size is normal. Tricuspid regurgitation signal is inadequate for assessing  PA pressure. The estimated right ventricular systolic pressure is 16.7  mmHg.   3. The mitral valve is abnormal. Mild mitral valve regurgitation. No  evidence of mitral stenosis.   4. The aortic valve is tricuspid. There is moderate calcification of the  aortic valve. There is moderate thickening of the aortic valve. Aortic  valve regurgitation is mild. Aortic valve sclerosis is present, with no  evidence of aortic  valve stenosis.   5. The inferior vena cava is normal in size with greater than 50%  respiratory variability, suggesting right atrial pressure of 3 mmHg.   6. Agitated saline contrast bubble study was negative, with no evidence  of any interatrial shunt.    Laboratory Data:  High Sensitivity Troponin:  No results for input(s): "TROPONINIHS" in the last 720 hours.   Chemistry Recent Labs  Lab 02/09/24 1743 02/10/24 0458 02/11/24 0414  NA 139 138 141  K 4.8 4.0 3.9  CL 107 108 111  CO2 21* 21* 20*  GLUCOSE 161* 117* 82  BUN 30* 30* 25*  CREATININE 1.35* 1.25* 1.10  CALCIUM 8.4* 7.9* 8.1*  MG 2.1  --   --   GFRNONAA 49* 54* >60  ANIONGAP 11 9 10     No results for input(s): "PROT", "ALBUMIN", "AST", "ALT", "ALKPHOS", "BILITOT" in the last 168 hours. Lipids No results for input(s): "CHOL", "TRIG", "HDL", "LABVLDL", "LDLCALC", "CHOLHDL" in the last 168 hours.  Hematology Recent Labs  Lab 02/09/24 1743 02/10/24 0458 02/11/24 0414  WBC 8.2 6.3 4.1  RBC 5.24 4.54 4.35  HGB 16.0 13.7 13.1  HCT 51.0 43.7 41.8  MCV 97.3 96.3 96.1  MCH 30.5 30.2 30.1  MCHC 31.4 31.4 31.3  RDW 15.3 15.3 15.0  PLT 128* 107* 93*   Thyroid  Recent Labs  Lab 02/10/24 0458  TSH 1.208    BNPNo results for input(s): "BNP", "PROBNP" in the last 168 hours.  DDimer No results for input(s): "DDIMER" in the last 168 hours.   Radiology/Studies:  ECHOCARDIOGRAM COMPLETE Result Date: 02/10/2024    ECHOCARDIOGRAM REPORT   Patient Name:   LESLY JOSLYN Date of Exam: 02/10/2024 Medical Rec #:  [id number]        Height:       67.0 in Accession #:    [id number]       Weight:       138.2 lb Date of Birth:  1929-08-11        BSA:          1.728 m Patient Age:    89 years         BP:           132/80 mmHg Patient Gender: M                HR:           82 bpm. Exam Location:  Inpatient Procedure: 2D Echo, Cardiac Doppler, Color  Doppler and Intracardiac            Opacification Agent (Both Spectral and Color Flow  Doppler were            utilized during procedure). Indications:    I48.91* Unspeicified atrial fibrillation  History:        Patient has no prior history of Echocardiogram examinations.                 CAD, Abnormal ECG, Arrythmias:Atrial Fibrillation and                 Bradycardia, Signs/Symptoms:Syncope; Risk Factors:Hypertension.                 Metastatic cancer.  Sonographer:    Sheralyn Boatman RDCS Referring Phys: [id number] [facility] GOEL  Sonographer Comments: Technically difficult study due to poor echo windows. IMPRESSIONS  1. Global hypokinesis with abnormal septal motion. Left ventricular ejection fraction, by estimation, is 45 to 50%. The left ventricle has mildly decreased function. The left ventricle demonstrates global hypokinesis. Left ventricular diastolic parameters are indeterminate.  2. Right ventricular systolic function is normal. The right ventricular size is normal. Tricuspid regurgitation signal is inadequate for assessing PA pressure. The estimated right ventricular systolic pressure is 16.7 mmHg.  3. The mitral valve is abnormal. Mild mitral valve regurgitation. No evidence of mitral stenosis.  4. The aortic valve is tricuspid. There is moderate calcification of the aortic valve. There is moderate thickening of the aortic valve. Aortic valve regurgitation is mild. Aortic valve sclerosis is present, with no evidence of aortic valve stenosis.  5. The inferior vena cava is normal in size with greater than 50% respiratory variability, suggesting right atrial pressure of 3 mmHg.  6. Agitated saline contrast bubble study was negative, with no evidence of any interatrial shunt. FINDINGS  Left Ventricle: Global hypokinesis with abnormal septal motion. Left ventricular ejection fraction, by estimation, is 45 to 50%. The left ventricle has mildly decreased function. The left ventricle demonstrates global hypokinesis. Definity contrast agent was given IV to delineate the left ventricular endocardial borders.  Strain was performed and the global longitudinal strain is indeterminate. The left ventricular internal cavity size was normal in size. There is no left ventricular hypertrophy. Left ventricular diastolic parameters are indeterminate. Right Ventricle: The right ventricular size is normal. No increase in right ventricular wall thickness. Right ventricular systolic function is normal. Tricuspid regurgitation signal is inadequate for assessing PA pressure. The tricuspid regurgitant velocity is 1.85 m/s, and with an assumed right atrial pressure of 3 mmHg, the estimated right ventricular systolic pressure is 16.7 mmHg. Left Atrium: Left atrial size was normal in size. Right Atrium: Right atrial size was normal in size. Pericardium: There is no evidence of pericardial effusion. Mitral Valve: The mitral valve is abnormal. There is mild thickening of the mitral valve leaflet(s). There is mild calcification of the mitral valve leaflet(s). Mild mitral valve regurgitation. No evidence of mitral valve stenosis. Tricuspid Valve: The tricuspid valve is normal in structure. Tricuspid valve regurgitation is not demonstrated. No evidence of tricuspid stenosis. Aortic Valve: The aortic valve is tricuspid. There is moderate calcification of the aortic valve. There is moderate thickening of the aortic valve. Aortic valve regurgitation is mild. Aortic valve sclerosis is present, with no evidence of aortic valve stenosis. Pulmonic Valve: The pulmonic valve was normal in structure. Pulmonic valve regurgitation is not visualized. No evidence of pulmonic stenosis. Aorta: The aortic root is normal in size and structure. Venous: The  inferior vena cava is normal in size with greater than 50% respiratory variability, suggesting right atrial pressure of 3 mmHg. IAS/Shunts: No atrial level shunt detected by color flow Doppler. Agitated saline contrast bubble study was negative, with no evidence of any interatrial shunt. Additional Comments: 3D  was performed not requiring image post processing on an independent workstation and was indeterminate.  LEFT VENTRICLE PLAX 2D LVIDd:         4.10 cm LVIDs:         3.20 cm LV PW:         1.00 cm LV IVS:        1.10 cm LVOT diam:     2.00 cm LV SV:         39 LV SV Index:   23 LVOT Area:     3.14 cm  LV Volumes (MOD) LV vol d, MOD A2C: 55.7 ml LV vol d, MOD A4C: 91.1 ml LV vol s, MOD A2C: 35.4 ml LV vol s, MOD A4C: 51.6 ml LV SV MOD A2C:     20.3 ml LV SV MOD A4C:     91.1 ml LV SV MOD BP:      29.6 ml RIGHT VENTRICLE             IVC RV S prime:     10.40 cm/s  IVC diam: 1.50 cm TAPSE (M-mode): 1.6 cm LEFT ATRIUM             Index        RIGHT ATRIUM           Index LA diam:        3.20 cm 1.85 cm/m   RA Area:     15.20 cm LA Vol (A2C):   30.6 ml 17.70 ml/m  RA Volume:   39.50 ml  22.85 ml/m LA Vol (A4C):   34.2 ml 19.79 ml/m LA Biplane Vol: 32.5 ml 18.80 ml/m  AORTIC VALVE LVOT Vmax:   78.65 cm/s LVOT Vmean:  52.050 cm/s LVOT VTI:    0.124 m  AORTA Ao Root diam: 3.10 cm Ao Asc diam:  3.10 cm MITRAL VALVE                TRICUSPID VALVE MV Area (PHT): 4.49 cm     TR Peak grad:   13.7 mmHg MV Decel Time: 169 msec     TR Vmax:        185.00 cm/s MV E velocity: 118.00 cm/s                             SHUNTS                             Systemic VTI:  0.12 m                             Systemic Diam: 2.00 cm Charlton Haws MD Electronically signed by Charlton Haws MD Signature Date/Time: 02/10/2024/1:22:38 PM    Final      Assessment and Plan:   A-fib with RVR - He last seen in cardiac office 01/05/24  EKG showing possible atrial flutter. He was not on any rhythm control or anticoagulation at this time. - Chadvasc- 4 - BP 154/94, HR fluctuating from 90-120's - This admission definitively noted A fib & a flutter via 12  lead EKG and telemetry review.  - 12 lead 02/09/24: Atrial Flutter, HR 99, Q waves in inferior leads and V2-4 (somewhat similar to previous EKG 01/05/2024) - Telemetry 02/12/24: Atrial Flutter with  variable block, HR 90-120's - Today, he reported ongoing dizziness for months, which occurs ar rest, with walking, and laying down sometimes. He admits to poor oral hydration. Also, being treated for gastroenteritis this admission with diarrhea and vomiting.  - currently controlled on Toprol XL 25 mg and Eliquis 5 mg - Increased Toprol XL 50 mg for better HR control.  - Can consider outpatient monitor.   Essential hypertension - Stable this admission  -  Previously on amlodipine and enalapril. Currently on hold due to AKI. Resume on discharge.   CAD S/P percutaneous coronary angioplasty Chest pain - 1997: CAD status post RCA and ramus branch intervention Dr. Dominga Ferry - 12/22/2003 Cath: stents with 60% mid LAD lesion and normal LV functions. - 08/04/2010 Cath: patent stents with noncritical CAD.  - Myoview performed 6/33/21: lateral scar without ischemia unchanged from prior stress test.  - Today, he reported dull chest pain that occurs with exertion, stress or rest. Sometimes relieved with laying down. Denies any associated diaphoresis or SOB. Troponin negative x 2 today 8>8. No ischemic EKG changes observed.  ECHO 02/10/24: Global hypokinesis with abnormal septal motion.  EF 45 to 50%.  LV mildly decreased function.  LV global hypokinesis.  RV SP 16.7 mmHg.  Mild MVR.  Mild AVR.  Moderate and thickening of aortic valve.  Negative bubble study. (Stable compared to nuclear stress test 05/07/2020) - At this time, no ischemic evaluation at this time. Can reconsider if cardiac changes occur or symptoms worsen.  Dyslipidemia - per last clinic note, LDL: 55 - on Lipitor 20 mg  Otherwise managed by Primary - Gastroenteritis - AKI    Risk Assessment/Risk Scores:    CHA2DS2-VASc Score = 4   This indicates a 4.8% annual risk of stroke. The patient's score is based upon: CHF History: 0 HTN History: 1 Diabetes History: 0 Stroke History: 0 Vascular Disease History: 1 Age Score: 2 Gender Score:  0    For questions or updates, please contact [facility] Please consult [email or web address] for contact info under    Signed, Basilio Cairo, PA-C  02/12/2024 1:46 PM

## 2024-02-12 NOTE — Progress Notes (Signed)
 [facility]                                                                               Donald Todd, is a 89 y.o. male, DOB - [birth year], WUJ:[id number] Admit date - 02/09/2024    Outpatient Primary MD for the patient is Daisy Floro, MD  LOS - 3  days    Brief summary   Donald Todd is a 89 y.o. male with medical history significant of medical issues as listed below.  Patient lives at assisted living facility.  Patient advises that his wife recently suffered from vomiting and diarrhea and is actually admitted at [facility] long facility.   Unfortunately today patient had another episode of vomiting and 2 more episodes of watery bowel movement. Patient felt extremely weak/lightheaded and came to the ER.   Assessment & Plan    Assessment and Plan:   New onset atrial fibrillation:  Rate not well controlled.  Was on IV cardizem,, had 2 sec pause, discontinued it.  Restarted patient's metoprolol 25 mg daily and increased it BID.  Pt reports persistent dizziness on walking .  Started the patient on Eliquis for anti coagulation.  Cardiology consulted for possible atrial flutter.    Acute gastroenteritis Slowly improving. Stool is positive for norovirus. C diff pcr is negative.  4 bowel movements so far.  Continue with symptomatic management.    BPH:  Resume flomax.   Right leg swelling  Get venous duplex to evaluate for DVT.    Estimated body mass index is 21.65 kg/m as calculated from the following:   Height as of this encounter: 5\' 7"  (1.702 m).   Weight as of this encounter: 62.7 kg.  Code Status: DNR DVT Prophylaxis:  SCDs Start: 02/09/24 2013 apixaban (ELIQUIS) tablet 5 mg   Level of Care: Level of care: Telemetry Family Communication: not at bedside. Discussed with son the plan.   Disposition Plan:     Remains inpatient appropriate:  pending clinical improvement.   Procedures:  echo  Consultants:   Cardiology.    Antimicrobials:   Anti-infectives (From admission, onward)    None        Medications  Scheduled Meds:  apixaban  5 mg Oral BID   atorvastatin  20 mg Oral q1800   loratadine  10 mg Oral Daily   [START ON 02/13/2024] metoprolol succinate  50 mg Oral Daily   multivitamin  1 tablet Oral BID   QUEtiapine  25 mg Oral QHS   sodium chloride flush  3 mL Intravenous Q12H   tamsulosin  0.4 mg Oral QPC supper   Continuous Infusions:   PRN Meds:.acetaminophen **OR** acetaminophen, metoprolol tartrate    Subjective:   Donald Todd was seen and examined today.  4 bowel  movements so far. Dizziness and hallucinations.   Objective:   Vitals:   02/11/24 2134 02/12/24 0557 02/12/24 0646 02/12/24 1438  BP: (!) 140/94 (!) 172/99 (!) 154/94 (!) 141/95  Pulse: (!) 57 64 68 94  Resp: 20 19  17   Temp: 98.2 F (36.8 C) 98.2 F (36.8 C)  98.2 F (36.8 C)  TempSrc: Oral Oral  Oral  SpO2:  96% 98%  99%  Weight:      Height:        Intake/Output Summary (Last 24 hours) at 02/12/2024 1718 Last data filed at 02/12/2024 1339 Gross per 24 hour  Intake 2567.62 ml  Output 400 ml  Net 2167.62 ml   Filed Weights   02/09/24 1722 02/09/24 2305  Weight: 64 kg 62.7 kg     General exam: Appears calm and comfortable  Respiratory system: Clear to auscultation. Respiratory effort normal. Cardiovascular system: S1 & S2 heard, irregularly irregular, no Jvd  Gastrointestinal system: Abdomen is nondistended, soft and nontender.  Central nervous system: Alert and oriented. No focal neurological deficits. Extremities: Symmetric 5 x 5 power. Skin: No rashes,  Psychiatry: Mood & affect appropriate.      Data Reviewed:  I have personally reviewed following labs and imaging studies   CBC Lab Results  Component Value Date   WBC 4.1 02/11/2024   RBC 4.35 02/11/2024   HGB 13.1 02/11/2024   HCT 41.8 02/11/2024   MCV 96.1 02/11/2024   MCH 30.1 02/11/2024   PLT 93 (L) 02/11/2024   MCHC  31.3 02/11/2024   RDW 15.0 02/11/2024   LYMPHSABS 1.0 02/11/2024   MONOABS 0.5 02/11/2024   EOSABS 0.1 02/11/2024   BASOSABS 0.0 02/11/2024     Last metabolic panel Lab Results  Component Value Date   NA 141 02/11/2024   K 3.9 02/11/2024   CL 111 02/11/2024   CO2 20 (L) 02/11/2024   BUN 25 (H) 02/11/2024   CREATININE 1.10 02/11/2024   GLUCOSE 82 02/11/2024   GFRNONAA >60 02/11/2024   GFRAA >60 06/19/2020   CALCIUM 8.1 (L) 02/11/2024   PROT 6.9 05/04/2023   ALBUMIN 4.0 05/04/2023   BILITOT 0.4 05/04/2023   ALKPHOS 117 05/04/2023   AST 22 05/04/2023   ALT 24 05/04/2023   ANIONGAP 10 02/11/2024    CBG (last 3)  No results for input(s): "GLUCAP" in the last 72 hours.    Coagulation Profile: Recent Labs  Lab 02/10/24 0458  INR 1.2     Radiology Studies: No results found.      Kathlen Mody M.D. [facility] 02/12/2024, 5:18 PM  Available via Epic secure chat 7am-7pm After 7 pm, please refer to night coverage provider listed on amion.

## 2024-02-13 ENCOUNTER — Inpatient Hospital Stay (HOSPITAL_COMMUNITY)

## 2024-02-13 DIAGNOSIS — A0811 Acute gastroenteropathy due to Norwalk agent: Secondary | ICD-10-CM | POA: Diagnosis not present

## 2024-02-13 DIAGNOSIS — M7989 Other specified soft tissue disorders: Secondary | ICD-10-CM

## 2024-02-13 DIAGNOSIS — E876 Hypokalemia: Secondary | ICD-10-CM

## 2024-02-13 DIAGNOSIS — I4892 Unspecified atrial flutter: Secondary | ICD-10-CM | POA: Diagnosis not present

## 2024-02-13 DIAGNOSIS — I4819 Other persistent atrial fibrillation: Secondary | ICD-10-CM | POA: Diagnosis not present

## 2024-02-13 DIAGNOSIS — D696 Thrombocytopenia, unspecified: Secondary | ICD-10-CM

## 2024-02-13 DIAGNOSIS — K529 Noninfective gastroenteritis and colitis, unspecified: Secondary | ICD-10-CM | POA: Diagnosis not present

## 2024-02-13 LAB — BASIC METABOLIC PANEL
Anion gap: 11 (ref 5–15)
BUN: 17 mg/dL (ref 8–23)
CO2: 19 mmol/L — ABNORMAL LOW (ref 22–32)
Calcium: 8.3 mg/dL — ABNORMAL LOW (ref 8.9–10.3)
Chloride: 108 mmol/L (ref 98–111)
Creatinine, Ser: 1.12 mg/dL (ref 0.61–1.24)
GFR, Estimated: >60 mL/min (ref >60)
Glucose, Bld: 124 mg/dL — ABNORMAL HIGH (ref 70–99)
Potassium: 3.2 mmol/L — ABNORMAL LOW (ref 3.5–5.1)
Sodium: 138 mmol/L (ref 135–145)

## 2024-02-13 LAB — CBC WITH DIFFERENTIAL/PLATELET
Abs Immature Granulocytes: 0.02 10*3/uL (ref 0.00–0.07)
Basophils Absolute: 0 10*3/uL (ref 0.0–0.1)
Basophils Relative: 0 %
Eosinophils Absolute: 0.1 10*3/uL (ref 0.0–0.5)
Eosinophils Relative: 2 %
HCT: 42.1 % (ref 39.0–52.0)
Hemoglobin: 13.4 g/dL (ref 13.0–17.0)
Immature Granulocytes: 0 %
Lymphocytes Relative: 26 %
Lymphs Abs: 1.2 10*3/uL (ref 0.7–4.0)
MCH: 30.2 pg (ref 26.0–34.0)
MCHC: 31.8 g/dL (ref 30.0–36.0)
MCV: 95 fL (ref 80.0–100.0)
Monocytes Absolute: 0.4 10*3/uL (ref 0.1–1.0)
Monocytes Relative: 9 %
Neutro Abs: 2.9 10*3/uL (ref 1.7–7.7)
Neutrophils Relative %: 63 %
Platelets: 110 10*3/uL — ABNORMAL LOW (ref 150–400)
RBC: 4.43 MIL/uL (ref 4.22–5.81)
RDW: 14.4 % (ref 11.5–15.5)
WBC: 4.7 10*3/uL (ref 4.0–10.5)
nRBC: 0 % (ref 0.0–0.2)

## 2024-02-13 LAB — MAGNESIUM: Magnesium: 1.8 mg/dL (ref 1.7–2.4)

## 2024-02-13 MED ORDER — AMLODIPINE BESYLATE 5 MG PO TABS
2.5000 mg | ORAL_TABLET | Freq: Every day | ORAL | Status: DC
  Administered 2024-02-13 – 2024-02-15 (×3): 2.5 mg via ORAL
  Filled 2024-02-13 (×3): qty 1

## 2024-02-13 MED ORDER — POTASSIUM CHLORIDE CRYS ER 20 MEQ PO TBCR
40.0000 meq | EXTENDED_RELEASE_TABLET | Freq: Two times a day (BID) | ORAL | Status: AC
  Administered 2024-02-13 – 2024-02-14 (×3): 40 meq via ORAL
  Filled 2024-02-13 (×3): qty 2

## 2024-02-13 NOTE — Progress Notes (Addendum)
 Rounding Note    Patient Name: Donald Todd Date of Encounter: 02/13/2024  [facility] Cardiologist: Nanetta Batty, MD   Subjective   This am, denies any CP, SOB, dizziness, palpations. However, he is confused and according to health care tech, he was trying to get up from bed last night multiple times to leave last night.  Inpatient Medications    Scheduled Meds:  apixaban  5 mg Oral BID   atorvastatin  20 mg Oral q1800   loratadine  10 mg Oral Daily   metoprolol succinate  50 mg Oral Daily   multivitamin  1 tablet Oral BID   QUEtiapine  25 mg Oral QHS   sodium chloride flush  3 mL Intravenous Q12H   tamsulosin  0.4 mg Oral QPC supper   Continuous Infusions:  PRN Meds: acetaminophen **OR** acetaminophen, metoprolol tartrate   Vital Signs    Vitals:   02/12/24 1438 02/12/24 1941 02/12/24 2103 02/13/24 0523  BP: (!) 141/95 (!) 189/86 (!) 152/92 (!) 159/96  Pulse: 94 68 (!) 59 66  Resp: 17 20 18 20   Temp: 98.2 F (36.8 C) (!) 97 F (36.1 C) 98 F (36.7 C) (!) 97.5 F (36.4 C)  TempSrc: Oral Oral Oral Oral  SpO2: 99% 97% 98% 98%  Weight:      Height:        Intake/Output Summary (Last 24 hours) at 02/13/2024 0749 Last data filed at 02/12/2024 1339 Gross per 24 hour  Intake 709.38 ml  Output --  Net 709.38 ml      02/09/2024   11:05 PM 02/09/2024    5:22 PM 01/05/2024   12:09 PM  Last 3 Weights  Weight (lbs) 138 lb 3.7 oz 141 lb 1.5 oz 142 lb  Weight (kg) 62.7 kg 64 kg 64.411 kg      Telemetry    Atrial Fibrillation, HR fluctuating 100-120, as high as 150 - Personally Reviewed  ECG    None new - Personally Reviewed  Physical Exam   GEN: Laying in bed in no acute distress. HCT bedside to monitor.  Neck: No JVD Cardiac: Irregularly irregular tachycardia Respiratory: Clear to auscultation bilaterally. GI: Soft, nontender, non-distended  MS: No edema; No deformity. Neuro:  Nonfocal  Psych: Normal affect   Labs    High  Sensitivity Troponin:  No results for input(s): "TROPONINIHS" in the last 720 hours.   Chemistry Recent Labs  Lab 02/09/24 1743 02/10/24 0458 02/11/24 0414 02/13/24 0400  NA 139 138 141 138  K 4.8 4.0 3.9 3.2*  CL 107 108 111 108  CO2 21* 21* 20* 19*  GLUCOSE 161* 117* 82 124*  BUN 30* 30* 25* 17  CREATININE 1.35* 1.25* 1.10 1.12  CALCIUM 8.4* 7.9* 8.1* 8.3*  MG 2.1  --   --   --   GFRNONAA 49* 54* >60 >60  ANIONGAP 11 9 10 11     Lipids No results for input(s): "CHOL", "TRIG", "HDL", "LABVLDL", "LDLCALC", "CHOLHDL" in the last 168 hours.  Hematology Recent Labs  Lab 02/10/24 0458 02/11/24 0414 02/13/24 0400  WBC 6.3 4.1 4.7  RBC 4.54 4.35 4.43  HGB 13.7 13.1 13.4  HCT 43.7 41.8 42.1  MCV 96.3 96.1 95.0  MCH 30.2 30.1 30.2  MCHC 31.4 31.3 31.8  RDW 15.3 15.0 14.4  PLT 107* 93* 110*   Thyroid  Recent Labs  Lab 02/10/24 0458  TSH 1.208    BNPNo results for input(s): "BNP", "PROBNP" in the last  168 hours.  DDimer No results for input(s): "DDIMER" in the last 168 hours.   Radiology    No results found.  Cardiac Studies   Lexiscan 05/07/2020 The left ventricular ejection fraction is mildly decreased (45-54%). Nuclear stress EF: 53%. There was no ST segment deviation noted during stress. Defect 1: There is a medium defect of moderate severity. This is a low risk study. Findings consistent with prior myocardial infarction with peri-infarct ischemia.   Abnormal, low risk stress nuclear study with prior lateral infarct and minimal peri-infarct ischemia.  Gated ejection fraction 53% with hypokinesis of the lateral wall   ECHO 02/10/2024 IMPRESSIONS   1. Global hypokinesis with abnormal septal motion. Left ventricular  ejection fraction, by estimation, is 45 to 50%. The left ventricle has  mildly decreased function. The left ventricle demonstrates global  hypokinesis. Left ventricular diastolic  parameters are indeterminate.   2. Right ventricular systolic  function is normal. The right ventricular  size is normal. Tricuspid regurgitation signal is inadequate for assessing  PA pressure. The estimated right ventricular systolic pressure is 16.7  mmHg.   3. The mitral valve is abnormal. Mild mitral valve regurgitation. No  evidence of mitral stenosis.   4. The aortic valve is tricuspid. There is moderate calcification of the  aortic valve. There is moderate thickening of the aortic valve. Aortic  valve regurgitation is mild. Aortic valve sclerosis is present, with no  evidence of aortic valve stenosis.   5. The inferior vena cava is normal in size with greater than 50%  respiratory variability, suggesting right atrial pressure of 3 mmHg.   6. Agitated saline contrast bubble study was negative, with no evidence  of any interatrial shunt.   Patient Profile     89 y.o. male with a hx of CAD s/p DES,, hypertension, dyslipidemia of who is being seen 02/12/2024 for the evaluation of atrial fibrillation at the request of Dr. Blake Divine.   Assessment & Plan    A-fib with RVR - He last seen in cardiac office 01/05/24  EKG showing possible atrial flutter. He was not on any rhythm control or anticoagulation at this time. - Chadvasc- 4 - BP 154/94, HR fluctuating from 90-120's - This admission definitively noted A fib & a flutter via 12 lead EKG and telemetry review.  - 12 lead 02/09/24: Atrial Flutter, HR 99, Q waves in inferior leads and V2-4 (somewhat similar to previous EKG 01/05/2024) - Telemetry 02/13/24: Atrial Fibrillation, HR fluctuating 100-120, as high as 150 for brief period - Today, he denies any palpations, SOB, dizziness - currently on Eliquis 5 mg - Starting Toprol XL 50 mg this am but has not received dose yet.  - With confusion, would not consider IV at this time. - With the acute illness, will avoid cardioversion at this time.   Hypokalemia  - K 3.2 this am, ordered KCL 40 BID  - ordering MG, will reassess.    Essential hypertension -   159/96 this am -  Previously on amlodipine and enalapril. Was on hold due to AKI but improved to baseline. Will reassess whether to restart discharge.  - Restarted amlodipine 2.5 mg   CAD S/P percutaneous coronary angioplasty Chest pain - 1997: CAD status post RCA and ramus branch intervention Dr. Dominga Ferry - 12/22/2003 Cath: stents with 60% mid LAD lesion and normal LV functions. - 08/04/2010 Cath: patent stents with noncritical CAD.  - Myoview performed 6/33/21: lateral scar without ischemia unchanged from prior stress test.  -  initially, reported dull chest pain that occurs with exertion, stress or rest. Sometimes relieved with laying down. Denies any associated diaphoresis or SOB. Troponin negative x 2 today 8>8. No ischemic EKG changes observed.  - ECHO 02/10/24: Global hypokinesis with abnormal septal motion.  EF 45 to 50%.  LV mildly decreased function.  LV global hypokinesis.  RV SP 16.7 mmHg.  Mild MVR.  Mild AVR.  Moderate and thickening of aortic valve.  Negative bubble study. (Stable compared to nuclear stress test 05/07/2020) - At this time, no ischemic evaluation at this time. Can reconsider if cardiac changes occur or symptoms worsen.] - this am, denies any chest pain.    Dyslipidemia - per last clinic note, LDL: 55 - on Lipitor 20 mg   Otherwise managed by Primary - Gastroenteritis - AKI      For questions or updates, please contact [facility] Please consult [email or web address] for contact info under        Signed, Basilio Cairo, PA-C  02/13/2024, 7:49 AM

## 2024-02-13 NOTE — Progress Notes (Addendum)
 [facility]                                                                               Donald Todd, is a 89 y.o. male, DOB - [birth year], ZOX:[id number] Admit date - 02/09/2024    Outpatient Primary MD for the patient is Daisy Floro, MD  LOS - 4  days    Brief summary   Donald Todd is a 89 y.o. male with medical history significant of medical issues as listed below.  Patient lives at assisted living facility.  Patient advises that his wife recently suffered from vomiting and diarrhea and is actually admitted at [facility] long facility.   Unfortunately today patient had another episode of vomiting and 2 more episodes of watery bowel movement. Patient felt extremely weak/lightheaded and came to the ER.  He was admitted for acute gastroenteritis from norovirus infection. He was also found to be in afib with RVR.   Assessment & Plan    Assessment and Plan:   Atrial fibrillation with RVR/Atrial flutter: Rates not well controlled, patient continues to report dizziness on walking.  Was initially on IV cardizem,, had 2 sec pause, discontinued it.  He was on metoprolol 25 mg daily, which was gradually increased to 50 mg Ddaily. On Eliquis for anticoagulation. Echocardiogram showed global hypokinesis with abnormal septal motion. LVEF is 45 to 50%. Left ventricular diastolic parameters are indeterminate.  Cardiology on board.  Check orthostatic vital signs in am.  Get MRI brain without contrast for persistent dizziness .  Check vestibular evaluation by PT for persistent dizziness.    Acute gastroenteritis Slowly improving. Stool is positive for norovirus. C diff pcr is negative.  Symptomatic management.    Hyperlipidemia Resume statin.    Acute encephalopathy suspect from hospital delirium, with hallucinations.  Sundowning.  Sitter in place.  Patient also reports persistent dizziness. No focal deficits .  Pt very forgetful, suspect dementia.     Mild thrombocytopenia Slowly improving.   BPH:  Resume flomax.   Right leg swelling  Negative for DVT.    Hypokalemia Replaced, repeat in am.  Check magnesium levels.    Hypertension; BP parameters are elevated, restarted home meds.     Estimated body mass index is 21.65 kg/m as calculated from the following:   Height as of this encounter: 5\' 7"  (1.702 m).   Weight as of this encounter: 62.7 kg.  Code Status: DNR DVT Prophylaxis:  SCDs Start: 02/09/24 2013 apixaban (ELIQUIS) tablet 5 mg   Level of Care: Level of care: Telemetry Family Communication: not at bedside. Discussed the plan with Son.   Disposition Plan:     Remains inpatient appropriate:  pending clinical improvement.   Procedures:  echo  Consultants:   Cardiology.   Antimicrobials:   Anti-infectives (From admission, onward)    None        Medications  Scheduled Meds:  amLODipine  2.5 mg Oral Daily   apixaban  5 mg Oral BID   atorvastatin  20 mg Oral q1800   loratadine  10 mg Oral Daily   metoprolol succinate  50 mg Oral Daily   multivitamin  1 tablet Oral BID  potassium chloride  40 mEq Oral BID   QUEtiapine  25 mg Oral QHS   sodium chloride flush  3 mL Intravenous Q12H   tamsulosin  0.4 mg Oral QPC supper   Continuous Infusions:   PRN Meds:.acetaminophen **OR** acetaminophen, metoprolol tartrate    Subjective:   Donald Todd was seen and examined today.  Diarrhea is slowly improving. Pt reports stool is forming.   Objective:   Vitals:   02/12/24 1941 02/12/24 2103 02/13/24 0523 02/13/24 1402  BP: (!) 189/86 (!) 152/92 (!) 159/96 (!) 123/90  Pulse: 68 (!) 59 66 (!) 101  Resp: 20 18 20 16   Temp: (!) 97 F (36.1 C) 98 F (36.7 C) (!) 97.5 F (36.4 C) (!) 97.5 F (36.4 C)  TempSrc: Oral Oral Oral Oral  SpO2: 97% 98% 98% 96%  Weight:      Height:        Intake/Output Summary (Last 24 hours) at 02/13/2024 1743 Last data filed at 02/13/2024 1342 Gross per 24  hour  Intake 843 ml  Output --  Net 843 ml   Filed Weights   02/09/24 1722 02/09/24 2305  Weight: 64 kg 62.7 kg     General exam: Appears calm and comfortable  Respiratory system: Clear to auscultation. Respiratory effort normal. Cardiovascular system: S1 & S2 heard, irregularly irregular. No JVD.  Gastrointestinal system: Abdomen is nondistended, soft and nontender.  Central nervous system: Alert and oriented.  Extremities: Symmetric 5 x 5 power. Skin: No rashes, Psychiatry: Mood & affect appropriate.        Data Reviewed:  I have personally reviewed following labs and imaging studies   CBC Lab Results  Component Value Date   WBC 4.7 02/13/2024   RBC 4.43 02/13/2024   HGB 13.4 02/13/2024   HCT 42.1 02/13/2024   MCV 95.0 02/13/2024   MCH 30.2 02/13/2024   PLT 110 (L) 02/13/2024   MCHC 31.8 02/13/2024   RDW 14.4 02/13/2024   LYMPHSABS 1.2 02/13/2024   MONOABS 0.4 02/13/2024   EOSABS 0.1 02/13/2024   BASOSABS 0.0 02/13/2024     Last metabolic panel Lab Results  Component Value Date   NA 138 02/13/2024   K 3.2 (L) 02/13/2024   CL 108 02/13/2024   CO2 19 (L) 02/13/2024   BUN 17 02/13/2024   CREATININE 1.12 02/13/2024   GLUCOSE 124 (H) 02/13/2024   GFRNONAA >60 02/13/2024   GFRAA >60 06/19/2020   CALCIUM 8.3 (L) 02/13/2024   PROT 6.9 05/04/2023   ALBUMIN 4.0 05/04/2023   BILITOT 0.4 05/04/2023   ALKPHOS 117 05/04/2023   AST 22 05/04/2023   ALT 24 05/04/2023   ANIONGAP 11 02/13/2024    CBG (last 3)  No results for input(s): "GLUCAP" in the last 72 hours.    Coagulation Profile: Recent Labs  Lab 02/10/24 0458  INR 1.2     Radiology Studies: VAS [place] LOWER EXTREMITY VENOUS (DVT) Result Date: 02/13/2024  Lower Venous DVT Study Patient Name:  Donald Todd  Date of Exam:   02/13/2024 Medical Rec #: [id number]         Accession #:    [id number] Date of Birth: 03-04-1929         Patient Gender: M Patient Age:   62 years Exam Location:  [facility] Procedure:      VAS [place] LOWER EXTREMITY VENOUS (DVT) Referring Phys: Kathlen Mody --------------------------------------------------------------------------------  Indications: Swelling.  Risk Factors: None identified. Comparison Study: No prior studies. Performing  Technologist: Chanda Busing RVT  Examination Guidelines: A complete evaluation includes B-mode imaging, spectral Doppler, color Doppler, and power Doppler as needed of all accessible portions of each vessel. Bilateral testing is considered an integral part of a complete examination. Limited examinations for reoccurring indications may be performed as noted. The reflux portion of the exam is performed with the patient in reverse Trendelenburg.  +---------+---------------+---------+-----------+----------+--------------+ RIGHT    CompressibilityPhasicitySpontaneityPropertiesThrombus Aging +---------+---------------+---------+-----------+----------+--------------+ CFV      Full           Yes      Yes                                 +---------+---------------+---------+-----------+----------+--------------+ SFJ      Full                                                        +---------+---------------+---------+-----------+----------+--------------+ FV Prox  Full                                                        +---------+---------------+---------+-----------+----------+--------------+ FV Mid   Full                                                        +---------+---------------+---------+-----------+----------+--------------+ FV DistalFull                                                        +---------+---------------+---------+-----------+----------+--------------+ PFV      Full                                                        +---------+---------------+---------+-----------+----------+--------------+ POP      Full           Yes      Yes                                  +---------+---------------+---------+-----------+----------+--------------+ PTV      Full                                                        +---------+---------------+---------+-----------+----------+--------------+ PERO     Full                                                        +---------+---------------+---------+-----------+----------+--------------+   +----+---------------+---------+-----------+----------+--------------+  LEFTCompressibilityPhasicitySpontaneityPropertiesThrombus Aging +----+---------------+---------+-----------+----------+--------------+ CFV Full           Yes      Yes                                 +----+---------------+---------+-----------+----------+--------------+     Summary: RIGHT: - There is no evidence of deep vein thrombosis in the lower extremity.  - No cystic structure found in the popliteal fossa.  LEFT: - No evidence of common femoral vein obstruction.   *See table(s) above for measurements and observations. Electronically signed by Lemar Livings MD on 02/13/2024 at 4:56:13 PM.    Final         Kathlen Mody M.D. [facility] 02/13/2024, 5:43 PM  Available via Epic secure chat 7am-7pm After 7 pm, please refer to night coverage provider listed on amion.

## 2024-02-13 NOTE — Plan of Care (Signed)

## 2024-02-13 NOTE — Progress Notes (Signed)
 Right lower extremity venous duplex has been completed. Preliminary results can be found in CV Proc through chart review.   02/13/24 8:58 AM Olen Cordial RVT

## 2024-02-13 NOTE — Progress Notes (Signed)
 Physical Therapy Treatment Patient Details Name: Donald Todd MRN: [id number] DOB: Nov 30, 1929 Today's Date: 02/13/2024   History of Present Illness 89 yo male admitted with afib, acute gastroenteritis. Hx of vertigo, prostate Ca, macular degeneration, CAD,m CKD, L rTSA, COVID, met renal cell Ca    PT Comments  PT - Cognition Comments: AxO x 3 sharpe as a tack.  Resides at [facility] with Spouse.  He "exercises" every day and walks around the parking lot as his ALF with NO AD. Assisted OOB to amb to bathroom then in hallway went well.  General Gait Details: tolerated an increased distance, amb full unit with walker "just because I have not walked in a few days", stated pt.  Prior level, Pt does not use.  Hx B TKR with "mild" c/o "stiffness" due to inactivity. Positioned in recliner to comfort. Pt plans to return to his ALF when medically stable.    If plan is discharge home, recommend the following: A little help with walking and/or transfers;A little help with bathing/dressing/bathroom;Assistance with cooking/housework;Assist for transportation;Help with stairs or ramp for entrance   Can travel by private vehicle        Equipment Recommendations  None recommended by PT    Recommendations for Other Services       Precautions / Restrictions Precautions Precautions: Fall Restrictions Weight Bearing Restrictions Per Provider Order: No     Mobility  Bed Mobility Overal bed mobility: Modified Independent             General bed mobility comments: self able    Transfers Overall transfer level: Modified independent Equipment used: None, Rolling walker (2 wheels)               General transfer comment: self able.    Ambulation/Gait Ambulation/Gait assistance: Supervision Gait Distance (Feet): 500 Feet Assistive device: Rolling walker (2 wheels) Gait Pattern/deviations: Step-through pattern, Decreased stride length Gait velocity: WNL     General Gait  Details: tolerated an increased distance, amb full unit with walker "just because I have not walked in a few days", stated pt.  Prior level, Pt does not use.  Hx B TKR with "mild" c/o "stiffness" due to inactivity.   Stairs             Wheelchair Mobility     Tilt Bed    Modified Rankin (Stroke Patients Only)       Balance                                            Communication    Cognition   Behavior During Therapy: WFL for tasks assessed/performed   PT - Cognitive impairments: No apparent impairments                       PT - Cognition Comments: AxO x 3 sharpe as a tack.  Resides at [facility] with Spouse.  He "exercises" every day and walks around the parking lot as his ALF with NO AD. Following commands: Intact      Cueing    Exercises      General Comments        Pertinent Vitals/Pain Pain Assessment Pain Assessment: No/denies pain    Home Living  Prior Function            PT Goals (current goals can now be found in the care plan section) Progress towards PT goals: Progressing toward goals    Frequency    Min 2X/week      PT Plan      Co-evaluation              AM-PAC PT "6 Clicks" Mobility   Outcome Measure  Help needed turning from your back to your side while in a flat bed without using bedrails?: None Help needed moving from lying on your back to sitting on the side of a flat bed without using bedrails?: None Help needed moving to and from a bed to a chair (including a wheelchair)?: None Help needed standing up from a chair using your arms (e.g., wheelchair or bedside chair)?: None Help needed to walk in hospital room?: A Little Help needed climbing 3-5 steps with a railing? : A Little 6 Click Score: 22    End of Session Equipment Utilized During Treatment: Gait belt Activity Tolerance: Patient tolerated treatment well Patient left: in chair;with  call bell/phone within reach;with family/visitor present Nurse Communication: Mobility status PT Visit Diagnosis: Muscle weakness (generalized) (M62.81)     Time: 1610-9604 PT Time Calculation (min) (ACUTE ONLY): 17 min  Charges:    $Gait Training: 8-22 mins PT General Charges $$ ACUTE PT VISIT: 1 Visit                     [name]  PTA Acute  Rehabilitation Services Office M-F          [phone]

## 2024-02-14 ENCOUNTER — Inpatient Hospital Stay (HOSPITAL_COMMUNITY)

## 2024-02-14 DIAGNOSIS — I4892 Unspecified atrial flutter: Secondary | ICD-10-CM | POA: Diagnosis not present

## 2024-02-14 DIAGNOSIS — K529 Noninfective gastroenteritis and colitis, unspecified: Secondary | ICD-10-CM | POA: Diagnosis not present

## 2024-02-14 DIAGNOSIS — I4819 Other persistent atrial fibrillation: Secondary | ICD-10-CM | POA: Diagnosis not present

## 2024-02-14 DIAGNOSIS — A0811 Acute gastroenteropathy due to Norwalk agent: Secondary | ICD-10-CM | POA: Diagnosis not present

## 2024-02-14 LAB — BASIC METABOLIC PANEL
Anion gap: 6 (ref 5–15)
BUN: 15 mg/dL (ref 8–23)
CO2: 24 mmol/L (ref 22–32)
Calcium: 8.3 mg/dL — ABNORMAL LOW (ref 8.9–10.3)
Chloride: 108 mmol/L (ref 98–111)
Creatinine, Ser: 1.14 mg/dL (ref 0.61–1.24)
GFR, Estimated: >60 mL/min (ref >60)
Glucose, Bld: 127 mg/dL — ABNORMAL HIGH (ref 70–99)
Potassium: 4.1 mmol/L (ref 3.5–5.1)
Sodium: 138 mmol/L (ref 135–145)

## 2024-02-14 MED ORDER — METOPROLOL SUCCINATE ER 50 MG PO TB24
75.0000 mg | ORAL_TABLET | Freq: Every day | ORAL | Status: DC
  Administered 2024-02-14 – 2024-02-15 (×2): 75 mg via ORAL
  Filled 2024-02-14 (×2): qty 1

## 2024-02-14 NOTE — Progress Notes (Signed)
 Rounding Note    Patient Name: Donald Todd Date of Encounter: 02/14/2024  [facility] Cardiologist: Nanetta Batty, MD   Subjective   Denies any CP, SOB, palpitations, dizziness  Inpatient Medications    Scheduled Meds:  amLODipine  2.5 mg Oral Daily   apixaban  5 mg Oral BID   atorvastatin  20 mg Oral q1800   loratadine  10 mg Oral Daily   metoprolol succinate  50 mg Oral Daily   multivitamin  1 tablet Oral BID   potassium chloride  40 mEq Oral BID   QUEtiapine  25 mg Oral QHS   sodium chloride flush  3 mL Intravenous Q12H   tamsulosin  0.4 mg Oral QPC supper   Continuous Infusions:  PRN Meds: acetaminophen **OR** acetaminophen, metoprolol tartrate   Vital Signs    Vitals:   02/13/24 0523 02/13/24 1402 02/13/24 2327 02/14/24 0503  BP: (!) 159/96 (!) 123/90 (!) 162/150 (!) 151/128  Pulse: 66 (!) 101 92 99  Resp: 20 16 18 18   Temp: (!) 97.5 F (36.4 C) (!) 97.5 F (36.4 C) 97.7 F (36.5 C) 97.7 F (36.5 C)  TempSrc: Oral Oral Oral Oral  SpO2: 98% 96% 97% 97%  Weight:      Height:        Intake/Output Summary (Last 24 hours) at 02/14/2024 0730 Last data filed at 02/14/2024 0541 Gross per 24 hour  Intake 963 ml  Output 200 ml  Net 763 ml      02/09/2024   11:05 PM 02/09/2024    5:22 PM 01/05/2024   12:09 PM  Last 3 Weights  Weight (lbs) 138 lb 3.7 oz 141 lb 1.5 oz 142 lb  Weight (kg) 62.7 kg 64 kg 64.411 kg      Telemetry    Afib, HR 90-100- Personally Reviewed  ECG    None new - Personally Reviewed  Physical Exam   GEN: Laying in bed with No acute distress.   Neck: No JVD Cardiac: RRR, no murmurs, rubs, or gallops; +2 radial pulse Respiratory: Clear to auscultation bilaterally. GI: Soft, nontender, non-distended  MS: + leg swelling, no pitting edema, No deformity. Neuro:  Nonfocal  Psych: Normal affect   Labs    High Sensitivity Troponin:  No results for input(s): "TROPONINIHS" in the last 720 hours.   Chemistry Recent  Labs  Lab 02/09/24 1743 02/10/24 0458 02/11/24 0414 02/13/24 0400 02/14/24 0428  NA 139   < > 141 138 138  K 4.8   < > 3.9 3.2* 4.1  CL 107   < > 111 108 108  CO2 21*   < > 20* 19* 24  GLUCOSE 161*   < > 82 124* 127*  BUN 30*   < > 25* 17 15  CREATININE 1.35*   < > 1.10 1.12 1.14  CALCIUM 8.4*   < > 8.1* 8.3* 8.3*  MG 2.1  --   --  1.8  --   GFRNONAA 49*   < > >60 >60 >60  ANIONGAP 11   < > 10 11 6    < > = values in this interval not displayed.    Lipids No results for input(s): "CHOL", "TRIG", "HDL", "LABVLDL", "LDLCALC", "CHOLHDL" in the last 168 hours.  Hematology Recent Labs  Lab 02/10/24 0458 02/11/24 0414 02/13/24 0400  WBC 6.3 4.1 4.7  RBC 4.54 4.35 4.43  HGB 13.7 13.1 13.4  HCT 43.7 41.8 42.1  MCV 96.3 96.1 95.0  MCH 30.2 30.1 30.2  MCHC 31.4 31.3 31.8  RDW 15.3 15.0 14.4  PLT 107* 93* 110*   Thyroid  Recent Labs  Lab 02/10/24 0458  TSH 1.208    BNPNo results for input(s): "BNP", "PROBNP" in the last 168 hours.  DDimer No results for input(s): "DDIMER" in the last 168 hours.   Radiology    VAS [place] LOWER EXTREMITY VENOUS (DVT) Result Date: 02/13/2024  Lower Venous DVT Study Patient Name:  HITESH FOUCHE  Date of Exam:   02/13/2024 Medical Rec #: [id number]         Accession #:    [id number] Date of Birth: 08/31/1929         Patient Gender: M Patient Age:   19 years Exam Location:  [facility] Procedure:      VAS [place] LOWER EXTREMITY VENOUS (DVT) Referring Phys: Kathlen Mody --------------------------------------------------------------------------------  Indications: Swelling.  Risk Factors: None identified. Comparison Study: No prior studies. Performing Technologist: Chanda Busing RVT  Examination Guidelines: A complete evaluation includes B-mode imaging, spectral Doppler, color Doppler, and power Doppler as needed of all accessible portions of each vessel. Bilateral testing is considered an integral part of a complete examination. Limited examinations  for reoccurring indications may be performed as noted. The reflux portion of the exam is performed with the patient in reverse Trendelenburg.  +---------+---------------+---------+-----------+----------+--------------+ RIGHT    CompressibilityPhasicitySpontaneityPropertiesThrombus Aging +---------+---------------+---------+-----------+----------+--------------+ CFV      Full           Yes      Yes                                 +---------+---------------+---------+-----------+----------+--------------+ SFJ      Full                                                        +---------+---------------+---------+-----------+----------+--------------+ FV Prox  Full                                                        +---------+---------------+---------+-----------+----------+--------------+ FV Mid   Full                                                        +---------+---------------+---------+-----------+----------+--------------+ FV DistalFull                                                        +---------+---------------+---------+-----------+----------+--------------+ PFV      Full                                                        +---------+---------------+---------+-----------+----------+--------------+  POP      Full           Yes      Yes                                 +---------+---------------+---------+-----------+----------+--------------+ PTV      Full                                                        +---------+---------------+---------+-----------+----------+--------------+ PERO     Full                                                        +---------+---------------+---------+-----------+----------+--------------+   +----+---------------+---------+-----------+----------+--------------+ LEFTCompressibilityPhasicitySpontaneityPropertiesThrombus Aging  +----+---------------+---------+-----------+----------+--------------+ CFV Full           Yes      Yes                                 +----+---------------+---------+-----------+----------+--------------+     Summary: RIGHT: - There is no evidence of deep vein thrombosis in the lower extremity.  - No cystic structure found in the popliteal fossa.  LEFT: - No evidence of common femoral vein obstruction.   *See table(s) above for measurements and observations. Electronically signed by Lemar Livings MD on 02/13/2024 at 4:56:13 PM.    Final     Cardiac Studies   Lexiscan 05/07/2020 The left ventricular ejection fraction is mildly decreased (45-54%). Nuclear stress EF: 53%. There was no ST segment deviation noted during stress. Defect 1: There is a medium defect of moderate severity. This is a low risk study. Findings consistent with prior myocardial infarction with peri-infarct ischemia.   Abnormal, low risk stress nuclear study with prior lateral infarct and minimal peri-infarct ischemia.  Gated ejection fraction 53% with hypokinesis of the lateral wall   ECHO 02/10/2024 IMPRESSIONS   1. Global hypokinesis with abnormal septal motion. Left ventricular  ejection fraction, by estimation, is 45 to 50%. The left ventricle has  mildly decreased function. The left ventricle demonstrates global  hypokinesis. Left ventricular diastolic  parameters are indeterminate.   2. Right ventricular systolic function is normal. The right ventricular  size is normal. Tricuspid regurgitation signal is inadequate for assessing  PA pressure. The estimated right ventricular systolic pressure is 16.7  mmHg.   3. The mitral valve is abnormal. Mild mitral valve regurgitation. No  evidence of mitral stenosis.   4. The aortic valve is tricuspid. There is moderate calcification of the  aortic valve. There is moderate thickening of the aortic valve. Aortic  valve regurgitation is mild. Aortic valve sclerosis is  present, with no  evidence of aortic valve stenosis.   5. The inferior vena cava is normal in size with greater than 50%  respiratory variability, suggesting right atrial pressure of 3 mmHg.   6. Agitated saline contrast bubble study was negative, with no evidence  of any interatrial shunt.   Patient Profile     89 y.o. male with a hx of CAD s/p DES,, hypertension, dyslipidemia of who is  being seen 02/12/2024 for the evaluation of atrial fibrillation at the request of Dr. Blake Divine.   Assessment & Plan    A-fib with RVR - He last seen in cardiac office 01/05/24  EKG showing possible atrial flutter. He was not on any rhythm control or anticoagulation at this time. - [place]adv[place]- 4 - BP 151/128 (suspect inaccurate), rechecked BP 129/73, HR fluctuating from 90-100's - This admission definitively noted A fib & a flutter via 12 lead EKG and telemetry review.  - 12 lead 02/09/24: Atrial Flutter, HR 99, Q waves in inferior leads and V2-4 (somewhat similar to previous EKG 01/05/2024) - Telemetry 02/14/24: Afib, HR 90-100's with overnight spike to 120's - Last night given Lopressor 5 mg IV - currently on Eliquis 5 mg & Toprol XL 50 mg this am - increasing Toprol 75 mg this am  - Today, he denies any palpations, SOB, dizziness - With the acute illness, will avoid cardioversion at this time.    Hypokalemia  - supplemented KCL 40 BID, K 3.2 > 4.1 - MG 1.8    Essential hypertension -  BP 151/128 (suspect inaccurate), rechecked BP 129/73, -  Previously on amlodipine and enalapril.  Will reassess whether to restart enalapril at discharge.  - Continue amlodipine 2.5 mg  Right leg edema  - Negative for DVT    CAD S/P percutaneous coronary angioplasty Chest pain - 1997: CAD status post RCA and ramus branch intervention Dr. Dominga Ferry - 12/22/2003 Cath: stents with 60% mid LAD lesion and normal LV functions. - 08/04/2010 Cath: patent stents with noncritical CAD.  - Myoview performed 6/33/21: lateral scar  without ischemia unchanged from prior stress test.  - initially, reported dull chest pain that occurs with exertion, stress or rest. Sometimes relieved with laying down. Denies any associated diaphoresis or SOB. Troponin negative x 2 today 8>8. No ischemic EKG changes observed.  - ECHO 02/10/24: Global hypokinesis with abnormal septal motion.  EF 45 to 50%.  LV mildly decreased function.  LV global hypokinesis.  RV SP 16.7 mmHg.  Mild MVR.  Mild AVR.  Moderate and thickening of aortic valve.  Negative bubble study. (Stable compared to nuclear stress test 05/07/2020) - At this time, no ischemic evaluation at this time. Can reconsider as needed. - this am, denies any chest pain.    Dyslipidemia - per last clinic note, LDL: 55 - Continue Lipitor 20 mg  Acute encephalopathy suspect from hospital delirium, with hallucinations.  - Sundowning.  - Sitter in place.  - Consider neurology consult for dementia evaluation   Otherwise managed by Primary - Gastroenteritis - AKI  - BPH     For questions or updates, please contact [facility] Please consult [email or web address] for contact info under        Signed, Basilio Cairo, PA-C  02/14/2024, 7:30 AM

## 2024-02-14 NOTE — Progress Notes (Signed)
 Physical Therapy Treatment Patient Details Name: Donald Todd MRN: [id number] DOB: 01/29/1929 Today's Date: 02/14/2024   History of Present Illness 89 yo male admitted with afib, acute gastroenteritis, + norovirus. Hx of vertigo, prostate Ca, macular degeneration, CAD,m CKD, L rTSA, COVID, met renal cell Ca    PT Comments  Pt making gradual progress with mobility - ambulated 300' in hallway with CGA.  Continue to recommend HHPT at pt's facility.  PT asked to see pt for vestibular evaluation.  Noted orthostatic BP negative with nursing.  MRI negative for acute changes.  Pt was negative for BPPV.  He reports 2 types of dizziness.  Some persistent lightheadedness (see below) not consistent with vestibular cause.  Also, occasional spinning sensation if he turns too quickly (particularly in closet) - possible vestibular hypofunction.  Educated on compensation techniques and gaze stabilization exercises.  Notified MD of results and likely non-vestibular cause for his persistent lightheadedness/dizziness.     If plan is discharge home, recommend the following: A little help with walking and/or transfers;A little help with bathing/dressing/bathroom;Assistance with cooking/housework;Assist for transportation;Help with stairs or ramp for entrance   Can travel by private vehicle        Equipment Recommendations  None recommended by PT    Recommendations for Other Services       Precautions / Restrictions Precautions Precautions: Fall     Mobility  Bed Mobility Overal bed mobility: Needs Assistance Bed Mobility: Sit to Supine       Sit to supine: Supervision        Transfers Overall transfer level: Needs assistance Equipment used: Rolling walker (2 wheels) Transfers: Sit to/from Stand Sit to Stand: Supervision                Ambulation/Gait Ambulation/Gait assistance: Contact guard assist Gait Distance (Feet): 300 Feet Assistive device: Rolling walker (2 wheels) Gait  Pattern/deviations: Step-through pattern, Decreased stride length Gait velocity: WFL     General Gait Details: CGA for safety; cues for segemental turns to assist with dizziness; no dizziness reported   Stairs             Wheelchair Mobility     Tilt Bed    Modified Rankin (Stroke Patients Only)       Balance Overall balance assessment: Needs assistance Sitting-balance support: No upper extremity supported Sitting balance-Leahy Scale: Good     Standing balance support: Bilateral upper extremity supported Standing balance-Leahy Scale: Fair Standing balance comment: RW to ambulate could stand without support                            Communication    Cognition Arousal: Alert Behavior During Therapy: WFL for tasks assessed/performed   PT - Cognitive impairments: No apparent impairments                                Cueing    Exercises      General Comments  VESTIBULAR ASSESSMENT:  History: Pt reports 2 types of dizziness.  Reports persistent lightheadedness for about 2 months but not every day.  States occurs in morning after he has been up for a few mins and continues till lunch time - constant and not affected by position.  Reports other dizziness of spinning sensation that occurs occasionally if he turns too quickly (particularly in closet).  States has been tested for BPPV in past and  negative.  Orthostatic BP negative with nursing today. MRI Head: Negative Reports legally blind, and decreased visual acuity lately  Objective: Spontaneous Nystagmus: negative Gaze induced Nystagmus: negative EOEM/Smooth Pursuit: intact Saccades: intact Gaze Stabilization: Negative Head Shake: negative (mild dizziness as expected) Head Thrust: Negative Test of Skew: Negative [place]: Negative Horizontal Roll: Negative  Pt educated on negative vestibular findings.  Did educate on compensation techniques for vertigo/spinning including  focus points and segmental turns.  Did encourage gaze stabilization exercises for possible mild vestibular hypofunction. Discussed if lightheaded return to sitting.        Pertinent Vitals/Pain Pain Assessment Pain Assessment: No/denies pain    Home Living                          Prior Function            PT Goals (current goals can now be found in the care plan section) Progress towards PT goals: Progressing toward goals    Frequency    Min 2X/week      PT Plan      Co-evaluation              AM-PAC PT "6 Clicks" Mobility   Outcome Measure  Help needed turning from your back to your side while in a flat bed without using bedrails?: None Help needed moving from lying on your back to sitting on the side of a flat bed without using bedrails?: A Little Help needed moving to and from a bed to a chair (including a wheelchair)?: A Little Help needed standing up from a chair using your arms (e.g., wheelchair or bedside chair)?: A Little Help needed to walk in hospital room?: A Little Help needed climbing 3-5 steps with a railing? : A Little 6 Click Score: 19    End of Session Equipment Utilized During Treatment: Gait belt Activity Tolerance: Patient tolerated treatment well Patient left: with call bell/phone within reach;in bed;with bed alarm set Nurse Communication: Mobility status PT Visit Diagnosis: Muscle weakness (generalized) (M62.81)     Time: 1555-1630 PT Time Calculation (min) (ACUTE ONLY): 35 min  Charges:    $Gait Training: 8-22 mins $Therapeutic Activity: 8-22 mins PT General Charges $$ ACUTE PT VISIT: 1 Visit                     Anise Salvo, PT Acute Rehab Services [facility] [phone]    Rayetta Humphrey 02/14/2024, 4:57 PM

## 2024-02-14 NOTE — Progress Notes (Signed)
 Received report from off-going RN. Agree with previous RN assessment. Pt sitting up in chair talking with family.

## 2024-02-14 NOTE — Progress Notes (Signed)
 [facility]  PROGRESS NOTE  Donald Todd ZOX:[id number] DOB: 05-10-1929 DOA: 02/09/2024 PCP: Daisy Floro, MD   Brief HPI:   89 y.o. male with medical history significant of medical issues as listed below.  Patient lives at assisted living facility.  Patient advises that his wife recently suffered from vomiting and diarrhea and is actually admitted at [facility] long facility.    Unfortunately today patient had another episode of vomiting and 2 more episodes of watery bowel movement. Patient felt extremely weak/lightheaded and came to the ER.  He was admitted for acute gastroenteritis from norovirus infection. He was also found to be in afib with RVR.     Assessment/Plan:   Atrial fibrillation with RVR/Atrial flutter: Rates not well controlled, patient continues to report dizziness on walking.  Was initially on IV cardizem,, had 2 sec pause, discontinued it.  He was on metoprolol 25 mg daily, which was gradually increased to 50 mg Ddaily. On Eliquis for anticoagulation. Echocardiogram showed global hypokinesis with abnormal septal motion. LVEF is 45 to 50%. Left ventricular diastolic parameters are indeterminate.  Cardiology on board.  Check orthostatic vital signs in am.  MRI brain showed no acute abnormality Check vestibular evaluation by PT for persistent dizziness.      Acute gastroenteritis Slowly improving. Stool is positive for norovirus. C diff pcr is negative.  Symptomatic management.      Hyperlipidemia Resume statin.      Acute encephalopathy suspect from hospital delirium, with hallucinations.  Sundowning.  Sitter in place.  Patient also reports persistent dizziness. No focal deficits .  Pt very forgetful, suspect dementia.      Mild thrombocytopenia Slowly improving.    BPH:  Resume flomax.    Right leg swelling  Negative for DVT.      Hypokalemia Replete      Hypertension; Continue amlodipine, metoprolol    Medications      amLODipine  2.5 mg Oral Daily   apixaban  5 mg Oral BID   atorvastatin  20 mg Oral q1800   loratadine  10 mg Oral Daily   metoprolol succinate  75 mg Oral Daily   multivitamin  1 tablet Oral BID   potassium chloride  40 mEq Oral BID   QUEtiapine  25 mg Oral QHS   sodium chloride flush  3 mL Intravenous Q12H   tamsulosin  0.4 mg Oral QPC supper     Data Reviewed:   CBG:  No results for input(s): "GLUCAP" in the last 168 hours.  SpO2: 100 %    Vitals:   02/13/24 2327 02/14/24 0503 02/14/24 0812 02/14/24 0944  BP: (!) 162/150 (!) 151/128 129/73 (!) 106/55  Pulse: 92 99  (!) 48  Resp: 18 18  19   Temp: 97.7 F (36.5 C) 97.7 F (36.5 C)    TempSrc: Oral Oral    SpO2: 97% 97% 100%   Weight:      Height:          Data Reviewed:  Basic Metabolic Panel: Recent Labs  Lab 02/09/24 1743 02/10/24 0458 02/11/24 0414 02/13/24 0400 02/14/24 0428  NA 139 138 141 138 138  K 4.8 4.0 3.9 3.2* 4.1  CL 107 108 111 108 108  CO2 21* 21* 20* 19* 24  GLUCOSE 161* 117* 82 124* 127*  BUN 30* 30* 25* 17 15  CREATININE 1.35* 1.25* 1.10 1.12 1.14  CALCIUM 8.4* 7.9* 8.1* 8.3* 8.3*  MG 2.1  --   --  1.8  --  CBC: Recent Labs  Lab 02/09/24 1743 02/10/24 0458 02/11/24 0414 02/13/24 0400  WBC 8.2 6.3 4.1 4.7  NEUTROABS 7.7  --  2.4 2.9  HGB 16.0 13.7 13.1 13.4  HCT 51.0 43.7 41.8 42.1  MCV 97.3 96.3 96.1 95.0  PLT 128* 107* 93* 110*    LFT No results for input(s): "AST", "ALT", "ALKPHOS", "BILITOT", "PROT", "ALBUMIN" in the last 168 hours.   Antibiotics: Anti-infectives (From admission, onward)    None        DVT prophylaxis: Apixaban  Code Status: DNR  Family Communication: No family at bedside   CONSULTS cardiology   Subjective   Denies diarrhea.   Objective    Physical Examination:  General-appears in no acute distress Heart-irregularly irregular rhythm Lungs-clear to auscultation bilaterally, no wheezing or crackles  auscultated Abdomen-soft, nontender, no organomegaly Extremities-no edema in the lower extremities Neuro-alert, oriented x3, no focal deficit noted  Status is: Inpatient:             Meredeth Ide   [facility] If 7PM-7AM, please contact night-coverage at [email or web address], Office  [phone]   02/14/2024, 10:41 AM  LOS: 5 days

## 2024-02-15 DIAGNOSIS — A0811 Acute gastroenteropathy due to Norwalk agent: Secondary | ICD-10-CM | POA: Diagnosis not present

## 2024-02-15 DIAGNOSIS — K529 Noninfective gastroenteritis and colitis, unspecified: Secondary | ICD-10-CM | POA: Diagnosis not present

## 2024-02-15 DIAGNOSIS — I4819 Other persistent atrial fibrillation: Secondary | ICD-10-CM | POA: Diagnosis not present

## 2024-02-15 LAB — CBC
HCT: 44.3 % (ref 39.0–52.0)
Hemoglobin: 14 g/dL (ref 13.0–17.0)
MCH: 30.6 pg (ref 26.0–34.0)
MCHC: 31.6 g/dL (ref 30.0–36.0)
MCV: 96.9 fL (ref 80.0–100.0)
Platelets: 126 10*3/uL — ABNORMAL LOW (ref 150–400)
RBC: 4.57 MIL/uL (ref 4.22–5.81)
RDW: 14.7 % (ref 11.5–15.5)
WBC: 7.2 10*3/uL (ref 4.0–10.5)
nRBC: 0 % (ref 0.0–0.2)

## 2024-02-15 LAB — BASIC METABOLIC PANEL
Anion gap: 7 (ref 5–15)
BUN: 16 mg/dL (ref 8–23)
CO2: 24 mmol/L (ref 22–32)
Calcium: 8.6 mg/dL — ABNORMAL LOW (ref 8.9–10.3)
Chloride: 108 mmol/L (ref 98–111)
Creatinine, Ser: 1.19 mg/dL (ref 0.61–1.24)
GFR, Estimated: 57 mL/min — ABNORMAL LOW (ref >60)
Glucose, Bld: 131 mg/dL — ABNORMAL HIGH (ref 70–99)
Potassium: 4.8 mmol/L (ref 3.5–5.1)
Sodium: 139 mmol/L (ref 135–145)

## 2024-02-15 MED ORDER — APIXABAN 5 MG PO TABS: 5.0000 mg | ORAL_TABLET | Freq: Two times a day (BID) | ORAL | 2 refills | Status: AC

## 2024-02-15 MED ORDER — METOPROLOL SUCCINATE ER 25 MG PO TB24: 75.0000 mg | ORAL_TABLET | Freq: Every day | ORAL | 3 refills | Status: DC

## 2024-02-15 MED ORDER — NITROGLYCERIN 0.4 MG SL SUBL: 0.4000 mg | SUBLINGUAL_TABLET | SUBLINGUAL | 3 refills | Status: AC | PRN

## 2024-02-15 MED ORDER — TAMSULOSIN HCL 0.4 MG PO CAPS: 0.4000 mg | ORAL_CAPSULE | Freq: Every day | ORAL | 3 refills | Status: AC

## 2024-02-15 NOTE — [place] FL2 (Signed)
 [place] MEDICAID FL2 LEVEL OF CARE FORM     IDENTIFICATION  Patient Name: Donald Todd Birthdate: 1929-09-30 Sex: male Admission Date (Current Location): 02/09/2024  [facility] and [place] Number:  Producer, television/film/video and Address:  [facility],  [address] [place], [place] [address]      Provider Number: [id number]  Attending Physician Name and Address:  Meredeth Ide, MD  Relative Name and Phone Number:  Keiandre, Cygan (Spouse)  [phone] (Mobile    Current Level of Care: Hospital Recommended Level of Care: Assisted Living Facility Prior Approval Number:    Date Approved/Denied:   PASRR Number:    Discharge Plan: Other (Comment) (ALF)    Current Diagnoses: Patient Active Problem List   Diagnosis Date Noted   A-fib (HCC) 02/09/2024   Gastroenteritis 02/09/2024   Right wrist pain 02/21/2023   UTI (urinary tract infection) 02/21/2023   Cough, persistent post Covid 19 infecton 02/21/2023   Constipation 02/21/2023   Urinary retention 02/21/2023   Other specified arthritis, 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   Debility 02/10/2023   COVID-19 02/04/2023   COVID-19 virus infection 02/03/2023   Bradycardia with 51-60 beats per minute 02/03/2023   Thrombocytopenia (HCC) 02/03/2023   Metastatic renal cell carcinoma to intra-abdominal site (HCC) 03/24/2021   Malignant neoplasm metastatic to right lung (HCC) 03/23/2021   Vertigo 06/18/2020   S/P shoulder replacement, left 05/26/2017   Syncope and collapse 04/03/2017   Abnormality of gait 09/07/2016   History of nephrectomy- Lt 06/16/2014   Chronic renal impairment, stage 3 (moderate) (HCC) 06/16/2014   CAD S/P percutaneous coronary angioplasty 05/15/2013   Essential hypertension 05/15/2013   Dyslipidemia 05/15/2013    Orientation RESPIRATION BLADDER Height & Weight     Self, Time, Situation, Place  Normal Continent Weight: 138 lb 3.7 oz (62.7 kg) Height:  5\' 7"  (170.2 cm)  BEHAVIORAL  SYMPTOMS/MOOD NEUROLOGICAL BOWEL NUTRITION STATUS      Continent Diet (regular)  AMBULATORY STATUS COMMUNICATION OF NEEDS Skin   Supervision   Normal                       Personal Care Assistance Level of Assistance  Bathing, Feeding, Dressing Bathing Assistance: Independent Feeding assistance: Independent Dressing Assistance: Independent     Functional Limitations Info  Sight, Hearing, Speech Sight Info: Adequate Hearing Info: Adequate Speech Info: Adequate    SPECIAL CARE FACTORS FREQUENCY                       Contractures Contractures Info: Not present    Additional Factors Info  Code Status, Allergies Code Status Info: DNR Allergies Info: Tape  Hydrocodone  Iohexol  Oxycodone           Current Medications (02/15/2024):  This is the current hospital active medication list Current Facility-Administered Medications  Medication Dose Route Frequency Provider Last Rate Last Admin   acetaminophen (TYLENOL) tablet 650 mg  650 mg Oral Q6H PRN Nolberto Hanlon, MD   650 mg at 02/12/24 2316   Or   acetaminophen (TYLENOL) suppository 650 mg  650 mg Rectal Q6H PRN Nolberto Hanlon, MD       amLODipine (NORVASC) tablet 2.5 mg  2.5 mg Oral Daily Dunlap, Scotesia Y, PA-C   2.5 mg at 02/15/24 1003   apixaban (ELIQUIS) tablet 5 mg  5 mg Oral BID Lucia Gaskins, RPH   5 mg at 02/15/24 1003   atorvastatin (LIPITOR)  tablet 20 mg  20 mg Oral q1800 Kathlen Mody, MD   20 mg at 02/14/24 1636   loratadine (CLARITIN) tablet 10 mg  10 mg Oral Daily Kathlen Mody, MD   10 mg at 02/15/24 1003   metoprolol succinate (TOPROL-XL) 24 hr tablet 75 mg  75 mg Oral Daily Renie Ora Y, PA-C   75 mg at 02/15/24 1003   metoprolol tartrate (LOPRESSOR) injection 5 mg  5 mg Intravenous Q8H PRN Kathlen Mody, MD   5 mg at 02/13/24 2202   multivitamin (PROSIGHT) tablet 1 tablet  1 tablet Oral BID Kathlen Mody, MD   1 tablet at 02/15/24 1003   QUEtiapine (SEROQUEL) tablet 25 mg  25 mg Oral QHS Kathlen Mody, MD   25 mg at 02/14/24 2127   sodium chloride flush (NS) 0.9 % injection 3 mL  3 mL Intravenous Q12H Nolberto Hanlon, MD   3 mL at 02/15/24 1004   tamsulosin (FLOMAX) capsule 0.4 mg  0.4 mg Oral QPC supper Nolberto Hanlon, MD   0.4 mg at 02/14/24 1636     Discharge Medications: STOP taking these medications     aspirin 81 MG tablet    enalapril 5 MG tablet Commonly known as: Vasotec           TAKE these medications     acetaminophen 325 MG tablet Commonly known as: TYLENOL Take 1-2 tablets (325-650 mg total) by mouth every 4 (four) hours as needed for mild pain.    amLODipine 2.5 MG tablet Commonly known as: NORVASC Take 1 tablet (2.5 mg total) by mouth daily.    apixaban 5 MG Tabs tablet Commonly known as: ELIQUIS Take 1 tablet (5 mg total) by mouth 2 (two) times daily.    atorvastatin 20 MG tablet Commonly known as: LIPITOR Take 20 mg by mouth daily. What changed: Another medication with the same name was removed. Continue taking this medication, and follow the directions you see here.    calcium carbonate 500 MG chewable tablet Commonly known as: TUMS - dosed in mg elemental calcium Chew 1,000 mg by mouth daily as needed for indigestion or heartburn.    docusate sodium 100 MG capsule Commonly known as: COLACE Take 1 capsule (100 mg total) by mouth every 12 (twelve) hours. What changed:  how much to take when to take this    ipratropium 0.03 % nasal spray Commonly known as: ATROVENT Place 1-2 sprays into both nostrils 2 (two) times daily as needed (allergies).    loperamide 2 MG tablet Commonly known as: IMODIUM A-D Take 2 mg by mouth every 6 (six) hours as needed for diarrhea or loose stools.    loratadine 10 MG tablet Commonly known as: CLARITIN Take 10 mg by mouth daily.    meclizine 25 MG tablet Commonly known as: ANTIVERT Take 25 mg by mouth as needed for dizziness.    metoprolol succinate 25 MG 24 hr tablet Commonly known as: TOPROL-XL Take 3  tablets (75 mg total) by mouth daily. Start taking on: February 16, 2024 What changed: how much to take    nitroGLYCERIN 0.4 MG SL tablet Commonly known as: NITROSTAT Place 1 tablet (0.4 mg total) under the tongue as needed.    polyethylene glycol 17 g packet Commonly known as: MIRALAX / GLYCOLAX Take 17 g by mouth daily.    predniSONE 10 MG tablet Commonly known as: DELTASONE Take 10 mg by mouth every morning.    PreserVision AREDS 2 Caps Take 1 capsule  by mouth 2 (two) times daily.    QUEtiapine 25 MG tablet Commonly known as: SEROQUEL Take 1 tablet (25 mg total) by mouth at bedtime.    tamsulosin 0.4 MG Caps capsule Commonly known as: FLOMAX Take 1-2 capsules (0.4-0.8 mg total) by mouth daily after supper.          Relevant Imaging Results:  Relevant Lab Results:   Additional Information SSN:[id number]  Valentina Shaggy Tamzin Bertling, LCSW

## 2024-02-15 NOTE — Progress Notes (Signed)
 Rounding Note    Patient Name: Donald Todd Date of Encounter: 02/15/2024  [facility] Cardiologist: Nanetta Batty, MD   Subjective   This am, denies any CP, SOB, dizziness, palpitations  Inpatient Medications    Scheduled Meds:  amLODipine  2.5 mg Oral Daily   apixaban  5 mg Oral BID   atorvastatin  20 mg Oral q1800   loratadine  10 mg Oral Daily   metoprolol succinate  75 mg Oral Daily   multivitamin  1 tablet Oral BID   QUEtiapine  25 mg Oral QHS   sodium chloride flush  3 mL Intravenous Q12H   tamsulosin  0.4 mg Oral QPC supper   Continuous Infusions:  PRN Meds: acetaminophen **OR** acetaminophen, metoprolol tartrate   Vital Signs    Vitals:   02/14/24 0944 02/14/24 1416 02/14/24 2012 02/15/24 0259  BP: (!) 106/55 (!) 144/88 (!) 146/98 (!) 159/92  Pulse: (!) 48 (!) 48 (!) 52   Resp: 19 20 16 16   Temp:  97.9 F (36.6 C) 97.6 F (36.4 C) 97.8 F (36.6 C)  TempSrc:  Oral Oral Oral  SpO2:   99% 100%  Weight:      Height:        Intake/Output Summary (Last 24 hours) at 02/15/2024 0739 Last data filed at 02/14/2024 2251 Gross per 24 hour  Intake 360 ml  Output 100 ml  Net 260 ml      02/09/2024   11:05 PM 02/09/2024    5:22 PM 01/05/2024   12:09 PM  Last 3 Weights  Weight (lbs) 138 lb 3.7 oz 141 lb 1.5 oz 142 lb  Weight (kg) 62.7 kg 64 kg 64.411 kg      Telemetry    Afibs, HR mainly 90-100's - Personally Reviewed  ECG    None new - Personally Reviewed  Physical Exam   GEN: Laying in bed in No acute distress.   Neck: No JVD Cardiac: Irregularly irregular RR, no murmurs, rubs, or gallops.  Respiratory: Clear to auscultation bilaterally. GI: Soft, nontender, non-distended  MS: No edema; No deformity. Neuro:  Nonfocal  Psych: Normal affect   Labs    High Sensitivity Troponin:  No results for input(s): "TROPONINIHS" in the last 720 hours.   Chemistry Recent Labs  Lab 02/09/24 1743 02/10/24 0458 02/13/24 0400  02/14/24 0428 02/15/24 0416  NA 139   < > 138 138 139  K 4.8   < > 3.2* 4.1 4.8  CL 107   < > 108 108 108  CO2 21*   < > 19* 24 24  GLUCOSE 161*   < > 124* 127* 131*  BUN 30*   < > 17 15 16   CREATININE 1.35*   < > 1.12 1.14 1.19  CALCIUM 8.4*   < > 8.3* 8.3* 8.6*  MG 2.1  --  1.8  --   --   GFRNONAA 49*   < > >60 >60 57*  ANIONGAP 11   < > 11 6 7    < > = values in this interval not displayed.    Lipids No results for input(s): "CHOL", "TRIG", "HDL", "LABVLDL", "LDLCALC", "CHOLHDL" in the last 168 hours.  Hematology Recent Labs  Lab 02/11/24 0414 02/13/24 0400 02/15/24 0416  WBC 4.1 4.7 7.2  RBC 4.35 4.43 4.57  HGB 13.1 13.4 14.0  HCT 41.8 42.1 44.3  MCV 96.1 95.0 96.9  MCH 30.1 30.2 30.6  MCHC 31.3 31.8 31.6  RDW 15.0  14.4 14.7  PLT 93* 110* 126*   Thyroid  Recent Labs  Lab 02/10/24 0458  TSH 1.208    BNPNo results for input(s): "BNP", "PROBNP" in the last 168 hours.  DDimer No results for input(s): "DDIMER" in the last 168 hours.   Radiology    MR BRAIN WO CONTRAST Result Date: 02/14/2024 CLINICAL DATA:  Mental status change with unknown cause. Persistent dizziness. EXAM: MRI HEAD WITHOUT CONTRAST TECHNIQUE: Multiplanar, multiecho pulse sequences of the brain and surrounding structures were obtained without intravenous contrast. COMPARISON:  CT 06/05/2023 and brain MRI 06/18/2020 FINDINGS: Brain: No acute infarction, hemorrhage, hydrocephalus, extra-axial collection or mass lesion. Chronic small vessel ischemia which is confluent in the deep cerebral white matter and mild in the pons. Mild cerebral volume loss for age. Vascular: Normal flow voids. Skull and upper cervical spine: Normal marrow signal. Sinuses/Orbits: Negative. IMPRESSION: No acute finding. Aging brain without significant progression from 2021. Electronically Signed   By: Tiburcio Pea M.D.   On: 02/14/2024 10:59   VAS [place] LOWER EXTREMITY VENOUS (DVT) Result Date: 02/13/2024  Lower Venous DVT Study  Patient Name:  ALANO BLASCO  Date of Exam:   02/13/2024 Medical Rec #: [id number]         Accession #:    [id number] Date of Birth: 15-Jul-1929         Patient Gender: M Patient Age:   77 years Exam Location:  [facility] Procedure:      VAS [place] LOWER EXTREMITY VENOUS (DVT) Referring Phys: Kathlen Mody --------------------------------------------------------------------------------  Indications: Swelling.  Risk Factors: None identified. Comparison Study: No prior studies. Performing Technologist: Chanda Busing RVT  Examination Guidelines: A complete evaluation includes B-mode imaging, spectral Doppler, color Doppler, and power Doppler as needed of all accessible portions of each vessel. Bilateral testing is considered an integral part of a complete examination. Limited examinations for reoccurring indications may be performed as noted. The reflux portion of the exam is performed with the patient in reverse Trendelenburg.  +---------+---------------+---------+-----------+----------+--------------+ RIGHT    CompressibilityPhasicitySpontaneityPropertiesThrombus Aging +---------+---------------+---------+-----------+----------+--------------+ CFV      Full           Yes      Yes                                 +---------+---------------+---------+-----------+----------+--------------+ SFJ      Full                                                        +---------+---------------+---------+-----------+----------+--------------+ FV Prox  Full                                                        +---------+---------------+---------+-----------+----------+--------------+ FV Mid   Full                                                        +---------+---------------+---------+-----------+----------+--------------+ FV DistalFull                                                        +---------+---------------+---------+-----------+----------+--------------+  PFV      Full                                                         +---------+---------------+---------+-----------+----------+--------------+ POP      Full           Yes      Yes                                 +---------+---------------+---------+-----------+----------+--------------+ PTV      Full                                                        +---------+---------------+---------+-----------+----------+--------------+ PERO     Full                                                        +---------+---------------+---------+-----------+----------+--------------+   +----+---------------+---------+-----------+----------+--------------+ LEFTCompressibilityPhasicitySpontaneityPropertiesThrombus Aging +----+---------------+---------+-----------+----------+--------------+ CFV Full           Yes      Yes                                 +----+---------------+---------+-----------+----------+--------------+     Summary: RIGHT: - There is no evidence of deep vein thrombosis in the lower extremity.  - No cystic structure found in the popliteal fossa.  LEFT: - No evidence of common femoral vein obstruction.   *See table(s) above for measurements and observations. Electronically signed by Lemar Livings MD on 02/13/2024 at 4:56:13 PM.    Final     Cardiac Studies   Lexiscan 05/07/2020 The left ventricular ejection fraction is mildly decreased (45-54%). Nuclear stress EF: 53%. There was no ST segment deviation noted during stress. Defect 1: There is a medium defect of moderate severity. This is a low risk study. Findings consistent with prior myocardial infarction with peri-infarct ischemia.   Abnormal, low risk stress nuclear study with prior lateral infarct and minimal peri-infarct ischemia.  Gated ejection fraction 53% with hypokinesis of the lateral wall   ECHO 02/10/2024 IMPRESSIONS   1. Global hypokinesis with abnormal septal motion. Left ventricular  ejection fraction, by  estimation, is 45 to 50%. The left ventricle has  mildly decreased function. The left ventricle demonstrates global  hypokinesis. Left ventricular diastolic  parameters are indeterminate.   2. Right ventricular systolic function is normal. The right ventricular  size is normal. Tricuspid regurgitation signal is inadequate for assessing  PA pressure. The estimated right ventricular systolic pressure is 16.7  mmHg.   3. The mitral valve is abnormal. Mild mitral valve regurgitation. No  evidence of mitral stenosis.   4. The aortic valve is tricuspid. There is moderate calcification of the  aortic valve. There is moderate thickening of the aortic valve. Aortic  valve regurgitation is mild. Aortic valve sclerosis is present, with no  evidence of aortic valve stenosis.   5.  The inferior vena cava is normal in size with greater than 50%  respiratory variability, suggesting right atrial pressure of 3 mmHg.   6. Agitated saline contrast bubble study was negative, with no evidence  of any interatrial shunt.    Patient Profile     89 y.o. male with a hx of CAD s/p DES,, hypertension, dyslipidemia of who is being seen 02/12/2024 for the evaluation of atrial fibrillation at the request of Dr. Blake Divine.   Assessment & Plan    A-fib with RVR - He last seen in cardiac office 01/05/24  EKG showing possible atrial flutter. He was not on any rhythm control or anticoagulation at this time. - Chadvasc- 4 - BP 159/92, rechecked BP in room 142/86, HR fluctuating from 90-100's - This admission definitively noted A fib & a flutter via 12 lead EKG and telemetry review.  - 12 lead 02/09/24: Atrial Flutter, HR 99, Q waves in inferior leads and V2-4 (somewhat similar to previous EKG 01/05/2024) - Telemetry 02/15/24: Afibs, HR mainly 90-100's  - currently on Eliquis 5 mg & starting Toprol XL 75 mg this am - This am, denies any CP, SOB, dizziness, palpitations - With the acute illness, will avoid cardioversion at this  time.  - Scheduled for Afib follow up on 3/31 at 1:30 pm. Will plan for cardioversion after at least 3 weeks of Eliquis compliance   Hypokalemia  - supplemented KCL 40 BID, K 3.2 > 4.1 > 4.8 - MG 1.8    Essential hypertension -  BP 159/92, rechecked BP in room 142/86, BP stable at this point  -  Previously on amlodipine and enalapril.  Will reassess whether to restart enalapril at discharge.  - Continue amlodipine 2.5 mg   Right leg edema  - Negative for DVT    CAD S/P percutaneous coronary angioplasty Chest pain - 1997: CAD status post RCA and ramus branch intervention Dr. Dominga Ferry - 12/22/2003 Cath: stents with 60% mid LAD lesion and normal LV functions. - 08/04/2010 Cath: patent stents with noncritical CAD.  - Myoview performed 6/33/21: lateral scar without ischemia unchanged from prior stress test.  - initially, reported dull chest pain that occurs with exertion, stress or rest. Sometimes relieved with laying down. Denies any associated diaphoresis or SOB. Troponin negative x 2 today 8>8. No ischemic EKG changes observed.  - ECHO 02/10/24: Global hypokinesis with abnormal septal motion.  EF 45 to 50%.  LV mildly decreased function.  LV global hypokinesis.  RV SP 16.7 mmHg.  Mild MVR.  Mild AVR.  Moderate and thickening of aortic valve.  Negative bubble study. (Stable compared to nuclear stress test 05/07/2020) - At this time, no ischemic evaluation at this time. Can reconsider as needed. - this am, denies any chest pain.    Dyslipidemia - per last clinic note, LDL: 55 - Continue Lipitor 20 mg   Acute encephalopathy suspect from hospital delirium, with hallucinations.  - Sundowning.  - Sitter in place.  - Consider neurology consult for dementia evaluation   Otherwise managed by Primary - Gastroenteritis - AKI  - BPH     For questions or updates, please contact [facility] Please consult [email or web address] for contact info under        Signed, Basilio Cairo, PA-C   02/15/2024, 7:39 AM

## 2024-02-15 NOTE — [place]2 (Signed)
 [place] MEDICA[place]2 LEVEL OF CARE FORM     IDENTIFICATION  Patient Name: Donald Todd Birthdate: [birth year] Sex: male Admission Date (Current Location): 02/09/2024  [facility] and [place] Number:  Producer, television/film/video and Address:  [facility],  [address] [place], [place] [address]      Provider Number: [id number]  Attending Physician Name and Address:  Meredeth Ide, MD  Relative Name and Phone Number:  Braelon, Sprung (Spouse)  [phone] (Mobile    Current Level of Care: Hospital Recommended Level of Care: Assisted Living Facility Prior Approval Number:    Date Approved/Denied:   PASRR Number:    Discharge Plan: Other (Comment) (ALF)    Current Diagnoses: Patient Active Problem List   Diagnosis Date Noted   A-fib (HCC) 02/09/2024   Gastroenteritis 02/09/2024   Right wrist pain 02/21/2023   UTI (urinary tract infection) 02/21/2023   Cough, persistent post Covid 19 infecton 02/21/2023   Constipation 02/21/2023   Urinary retention 02/21/2023   Other specified arthritis, 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   Debility 02/10/2023   COVID-19 02/04/2023   COVID-19 virus infection 02/03/2023   Bradycardia with 51-60 beats per minute 02/03/2023   Thrombocytopenia (HCC) 02/03/2023   Metastatic renal cell carcinoma to intra-abdominal site (HCC) 03/24/2021   Malignant neoplasm metastatic to right lung (HCC) 03/23/2021   Vertigo 06/18/2020   S/P shoulder replacement, left 05/26/2017   Syncope and collapse 04/03/2017   Abnormality of gait 09/07/2016   History of nephrectomy- Lt 06/16/2014   Chronic renal impairment, stage 3 (moderate) (HCC) 06/16/2014   CAD S/P percutaneous coronary angioplasty 05/15/2013   Essential hypertension 05/15/2013   Dyslipidemia 05/15/2013    Orientation RESPIRATION BLADDER Height & Weight     Self, Time, Situation, Place  Normal Continent Weight: 138 lb 3.7 oz (62.7 kg) Height:  5\' 7"  (170.2 cm)  BEHAVIORAL  SYMPTOMS/MOOD NEUROLOGICAL BOWEL NUTRITION STATUS      Continent Diet (regular)  AMBULATORY STATUS COMMUNICATION OF NEEDS Skin   Supervision   Normal                       Personal Care Assistance Level of Assistance  Bathing, Feeding, Dressing Bathing Assistance: Independent Feeding assistance: Independent Dressing Assistance: Independent     Functional Limitations Info  Sight, Hearing, Speech Sight Info: Adequate Hearing Info: Adequate Speech Info: Adequate    SPECIAL CARE FACTORS FREQUENCY                       Contractures Contractures Info: Not present    Additional Factors Info  Code Status, Allergies Code Status Info: DNR Allergies Info: Tape  Hydrocodone  Iohexol  Oxycodone           Current Medications (02/15/2024):  This is the current hospital active medication list Current Facility-Administered Medications  Medication Dose Route Frequency Provider Last Rate Last Admin   acetaminophen (TYLENOL) tablet 650 mg  650 mg Oral Q6H PRN Nolberto Hanlon, MD   650 mg at 02/12/24 2316   Or   acetaminophen (TYLENOL) suppository 650 mg  650 mg Rectal Q6H PRN Nolberto Hanlon, MD       amLODipine (NORVASC) tablet 2.5 mg  2.5 mg Oral Daily Dunlap, Scotesia Y, PA-C   2.5 mg at 02/15/24 1003   apixaban (ELIQUIS) tablet 5 mg  5 mg Oral BID Lucia Gaskins, RPH   5 mg at 02/15/24 1003   atorvastatin (LIPITOR)  tablet 20 mg  20 mg Oral q1800 Kathlen Mody, MD   20 mg at 02/14/24 1636   loratadine (CLARITIN) tablet 10 mg  10 mg Oral Daily Kathlen Mody, MD   10 mg at 02/15/24 1003   metoprolol succinate (TOPROL-XL) 24 hr tablet 75 mg  75 mg Oral Daily Renie Ora Y, PA-C   75 mg at 02/15/24 1003   metoprolol tartrate (LOPRESSOR) injection 5 mg  5 mg Intravenous Q8H PRN Kathlen Mody, MD   5 mg at 02/13/24 2202   multivitamin (PROSIGHT) tablet 1 tablet  1 tablet Oral BID Kathlen Mody, MD   1 tablet at 02/15/24 1003   QUEtiapine (SEROQUEL) tablet 25 mg  25 mg Oral QHS Kathlen Mody, MD   25 mg at 02/14/24 2127   sodium chloride flush (NS) 0.9 % injection 3 mL  3 mL Intravenous Q12H Nolberto Hanlon, MD   3 mL at 02/15/24 1004   tamsulosin (FLOMAX) capsule 0.4 mg  0.4 mg Oral QPC supper Nolberto Hanlon, MD   0.4 mg at 02/14/24 1636     Discharge Medications: STOP taking these medications     aspirin 81 MG tablet    enalapril 5 MG tablet Commonly known as: Vasotec           TAKE these medications     acetaminophen 325 MG tablet Commonly known as: TYLENOL Take 1-2 tablets (325-650 mg total) by mouth every 4 (four) hours as needed for mild pain.    amLODipine 2.5 MG tablet Commonly known as: NORVASC Take 1 tablet (2.5 mg total) by mouth daily.    apixaban 5 MG Tabs tablet Commonly known as: ELIQUIS Take 1 tablet (5 mg total) by mouth 2 (two) times daily.    atorvastatin 20 MG tablet Commonly known as: LIPITOR Take 20 mg by mouth daily. What changed: Another medication with the same name was removed. Continue taking this medication, and follow the directions you see here.    calcium carbonate 500 MG chewable tablet Commonly known as: TUMS - dosed in mg elemental calcium Chew 1,000 mg by mouth daily as needed for indigestion or heartburn.    docusate sodium 100 MG capsule Commonly known as: COLACE Take 1 capsule (100 mg total) by mouth every 12 (twelve) hours. What changed:  how much to take when to take this    ipratropium 0.03 % nasal spray Commonly known as: ATROVENT Place 1-2 sprays into both nostrils 2 (two) times daily as needed (allergies).    loperamide 2 MG tablet Commonly known as: IMODIUM A-D Take 2 mg by mouth every 6 (six) hours as needed for diarrhea or loose stools.    loratadine 10 MG tablet Commonly known as: CLARITIN Take 10 mg by mouth daily.    meclizine 25 MG tablet Commonly known as: ANTIVERT Take 25 mg by mouth as needed for dizziness.    metoprolol succinate 25 MG 24 hr tablet Commonly known as: TOPROL-XL Take 3  tablets (75 mg total) by mouth daily. Start taking on: February 16, 2024 What changed: how much to take    nitroGLYCERIN 0.4 MG SL tablet Commonly known as: NITROSTAT Place 1 tablet (0.4 mg total) under the tongue every 5 (five) minutes as needed for chest pain (Max 3 doeses). What changed:  when to take this reasons to take this    polyethylene glycol 17 g packet Commonly known as: MIRALAX / GLYCOLAX Take 17 g by mouth daily.    predniSONE 10 MG tablet  Commonly known as: DELTASONE Take 10 mg by mouth every morning.    PreserVision AREDS 2 Caps Take 1 capsule by mouth 2 (two) times daily.    QUEtiapine 25 MG tablet Commonly known as: SEROQUEL Take 1 tablet (25 mg total) by mouth at bedtime.    tamsulosin 0.4 MG Caps capsule Commonly known as: FLOMAX Take 1 capsule (0.4 mg total) by mouth daily after supper. What changed: how much to take    Relevant Imaging Results:  Relevant Lab Results:   Additional Information SSN:[id number]  Valentina Shaggy [place] Mckeithan, LCSW

## 2024-02-15 NOTE — Discharge Summary (Addendum)
 Physician Discharge Summary   Patient: Donald Todd MRN: [id number] DOB: 05/28/1929  Admit date:     02/09/2024  Discharge date: 02/15/24  Discharge Physician: Meredeth Ide   PCP: Daisy Floro, MD   Recommendations at discharge:   Follow-up cardiology as outpatient  Discharge Diagnoses: Principal Problem:   A-fib [facility]) Active Problems:   Gastroenteritis  Resolved Problems:   * No resolved hospital problems. *  Hospital Course: 89 y.o. male with medical history significant of medical issues as listed below.  Patient lives at assisted living facility.  Patient advises that his wife recently suffered from vomiting and diarrhea and is actually admitted at [facility] long facility.    Unfortunately today patient had another episode of vomiting and 2 more episodes of watery bowel movement. Patient felt extremely weak/lightheaded and came to the ER.  He was admitted for acute gastroenteritis from norovirus infection. He was also found to be in afib with RVR.    Assessment and Plan:  Atrial fibrillation with RVR/Atrial flutter: Rates not well controlled, patient continues to report dizziness on walking.  Was initially on IV cardizem,, had 2 sec pause, discontinued it.  He was on metoprolol 25 mg daily, which was gradually increased to 75 milligram p.o. daily On Eliquis for anticoagulation. Echocardiogram showed global hypokinesis with abnormal septal motion. LVEF is 45 to 50%. Left ventricular diastolic parameters are indeterminate.  Cardiology on board.  orthostatic blood pressure was negative for hypotension MRI brain showed no acute abnormality Home health PT recommended.  Will discharge with home health PT. Continue Toprol XL 75 mg daily, Eliquis 5 mg p.o. twice daily     Acute gastroenteritis Slowly improving. Stool is positive for norovirus. C diff pcr is negative.  Symptomatic management.      Hyperlipidemia Resume statin.      Acute encephalopathy suspect from  hospital delirium, with hallucinations.  Sundowning.  Sitter in place.  Patient also reports persistent dizziness. No focal deficits .  Pt very forgetful, suspect dementia.  -MRI brain was negative for acute abnormality     Mild thrombocytopenia Slowly improving.    BPH:  Resume flomax.    Right leg swelling  Negative for DVT.      Hypokalemia Replete    CAD S/P percutaneous coronary angioplasty  -Stop aspirin, patient started on Eliquis -Continue Toprol XL, Lipitor   Hypertension; Continue amlodipine, metoprolol -Hold Vasotec to avoid hypotension         Consultants: Cardiology Procedures performed:  Disposition: Home Diet recommendation:  Discharge Diet Orders (From admission, onward)     Start     Ordered   02/15/24 0000  Diet - low sodium heart healthy        02/15/24 1340           Cardiac diet DISCHARGE MEDICATION: Allergies as of 02/15/2024       Reactions   Tape Hives   Surgical tape - causes blisters   Hydrocodone    Shaking uncontrollably    Iohexol     Desc: PT DOES RECALL REACTION JUST SAYS IT WAS A LONG TIME AGO FOR KIDNEY X-RAYS AND HE DIDN'T TOLERATE IT WELL-ARS 05/02/09-NOTE E-CHART STATES A HOT FEELING IS HIS REACTION, BUT HE CAN'T CONFIRM THAT WAS ALL   Oxycodone Other (See Comments)   Shaking uncontrollably         Medication List     STOP taking these medications    aspirin 81 MG tablet   enalapril 5 MG  tablet Commonly known as: Vasotec       TAKE these medications    acetaminophen 325 MG tablet Commonly known as: TYLENOL Take 1-2 tablets (325-650 mg total) by mouth every 4 (four) hours as needed for mild pain.   amLODipine 2.5 MG tablet Commonly known as: NORVASC Take 1 tablet (2.5 mg total) by mouth daily.   apixaban 5 MG Tabs tablet Commonly known as: ELIQUIS Take 1 tablet (5 mg total) by mouth 2 (two) times daily.   atorvastatin 20 MG tablet Commonly known as: LIPITOR Take 20 mg by mouth daily. What  changed: Another medication with the same name was removed. Continue taking this medication, and follow the directions you see here.   calcium carbonate 500 MG chewable tablet Commonly known as: TUMS - dosed in mg elemental calcium Chew 1,000 mg by mouth daily as needed for indigestion or heartburn.   docusate sodium 100 MG capsule Commonly known as: COLACE Take 1 capsule (100 mg total) by mouth every 12 (twelve) hours. What changed:  how much to take when to take this   ipratropium 0.03 % nasal spray Commonly known as: ATROVENT Place 1-2 sprays into both nostrils 2 (two) times daily as needed (allergies).   loperamide 2 MG tablet Commonly known as: IMODIUM A-D Take 2 mg by mouth every 6 (six) hours as needed for diarrhea or loose stools.   loratadine 10 MG tablet Commonly known as: CLARITIN Take 10 mg by mouth daily.   meclizine 25 MG tablet Commonly known as: ANTIVERT Take 25 mg by mouth as needed for dizziness.   metoprolol succinate 25 MG 24 hr tablet Commonly known as: TOPROL-XL Take 3 tablets (75 mg total) by mouth daily. Start taking on: February 16, 2024 What changed: how much to take   nitroGLYCERIN 0.4 MG SL tablet Commonly known as: NITROSTAT Place 1 tablet (0.4 mg total) under the tongue every 5 (five) minutes as needed for chest pain (Max 3 doeses). What changed:  when to take this reasons to take this   polyethylene glycol 17 g packet Commonly known as: MIRALAX / GLYCOLAX Take 17 g by mouth daily.   predniSONE 10 MG tablet Commonly known as: DELTASONE Take 10 mg by mouth every morning.   PreserVision AREDS 2 Caps Take 1 capsule by mouth 2 (two) times daily.   QUEtiapine 25 MG tablet Commonly known as: SEROQUEL Take 1 tablet (25 mg total) by mouth at bedtime.   tamsulosin 0.4 MG Caps capsule Commonly known as: FLOMAX Take 1 capsule (0.4 mg total) by mouth daily after supper. What changed: how much to take        Follow-up Information      Parke Poisson, MD. Schedule an appointment as soon as possible for a visit.   Specialties: Cardiology, Radiology Contact information: [address] [place] [address] [phone]                Discharge Exam: Ceasar Mons Weights   02/09/24 1722 02/09/24 2305  Weight: 64 kg 62.7 kg   General-appears in no acute distress Heart-S1-S2, regular, no murmur auscultated Lungs-clear to auscultation bilaterally, no wheezing or crackles auscultated Abdomen-soft, nontender, no organomegaly Extremities-no edema in the lower extremities Neuro-alert, oriented x3, no focal deficit noted  Condition at discharge: good  The results of significant diagnostics from this hospitalization (including imaging, microbiology, ancillary and laboratory) are listed below for reference.   Imaging Studies: MR BRAIN WO CONTRAST Result Date: 02/14/2024 CLINICAL DATA:  Mental status change with unknown cause. Persistent dizziness. EXAM: MRI HEAD WITHOUT CONTRAST TECHNIQUE: Multiplanar, multiecho pulse sequences of the brain and surrounding structures were obtained without intravenous contrast. COMPARISON:  CT 06/05/2023 and brain MRI 06/18/2020 FINDINGS: Brain: No acute infarction, hemorrhage, hydrocephalus, extra-axial collection or mass lesion. Chronic small vessel ischemia which is confluent in the deep cerebral white matter and mild in the pons. Mild cerebral volume loss for age. Vascular: Normal flow voids. Skull and upper cervical spine: Normal marrow signal. Sinuses/Orbits: Negative. IMPRESSION: No acute finding. Aging brain without significant progression from 2021. Electronically Signed   By: Tiburcio Pea M.D.   On: 02/14/2024 10:59   VAS [place] LOWER EXTREMITY VENOUS (DVT) Result Date: 02/13/2024  Lower Venous DVT Study Patient Name:  Donald Todd  Date of Exam:   02/13/2024 Medical Rec #: [id number]         Accession #:    [id number] Date of Birth: [birth year]         Patient Gender: M  Patient Age:   48 years Exam Location[facility]  [facility] Procedure:      VAS [place] LOWER EXTREMITY VENOUS (DVT) Referring Phys: Kathlen Mody --------------------------------------------------------------------------------  Indications: Swelling.  Risk Factors: None identified. Comparison Study: No prior studies. Performing Technologist: Chanda Busing RVT  Examination Guidelines: A complete evaluation includes B-mode imaging, spectral Doppler, color Doppler, and power Doppler as needed of all accessible portions of each vessel. Bilateral testing is considered an integral part of a complete examination. Limited examinations for reoccurring indications may be performed as noted. The reflux portion of the exam is performed with the patient in reverse Trendelenburg.  +---------+---------------+---------+-----------+----------+--------------+ RIGHT    CompressibilityPhasicitySpontaneityPropertiesThrombus Aging +---------+---------------+---------+-----------+----------+--------------+ CFV      Full           Yes      Yes                                 +---------+---------------+---------+-----------+----------+--------------+ SFJ      Full                                                        +---------+---------------+---------+-----------+----------+--------------+ FV Prox  Full                                                        +---------+---------------+---------+-----------+----------+--------------+ FV Mid   Full                                                        +---------+---------------+---------+-----------+----------+--------------+ FV DistalFull                                                        +---------+---------------+---------+-----------+----------+--------------+ PFV      Full                                                        +---------+---------------+---------+-----------+----------+--------------+  POP      Full           Yes       Yes                                 +---------+---------------+---------+-----------+----------+--------------+ PTV      Full                                                        +---------+---------------+---------+-----------+----------+--------------+ PERO     Full                                                        +---------+---------------+---------+-----------+----------+--------------+   +----+---------------+---------+-----------+----------+--------------+ LEFTCompressibilityPhasicitySpontaneityPropertiesThrombus Aging +----+---------------+---------+-----------+----------+--------------+ CFV Full           Yes      Yes                                 +----+---------------+---------+-----------+----------+--------------+     Summary: RIGHT: - There is no evidence of deep vein thrombosis in the lower extremity.  - No cystic structure found in the popliteal fossa.  LEFT: - No evidence of common femoral vein obstruction.   *See table(s) above for measurements and observations. Electronically signed by Lemar Livings MD on 02/13/2024 at 4:56:13 PM.    Final    ECHOCARDIOGRAM COMPLETE Result Date: 02/10/2024    ECHOCARDIOGRAM REPORT   Patient Name:   Donald Todd Date of Exam: 02/10/2024 Medical Rec #:  [id number]        Height:       67.0 in Accession #:    [id number]       Weight:       138.2 lb Date of Birth:  [birth year]        BSA:          1.728 m Patient Age:    89 years         BP:           132/80 mmHg Patient Gender: M                HR:           82 bpm. Exam Location:  Inpatient Procedure: 2D Echo, Cardiac Doppler, Color Doppler and Intracardiac            Opacification Agent (Both Spectral and Color Flow Doppler were            utilized during procedure). Indications:    I48.91* Unspeicified atrial fibrillation  History:        Patient has no prior history of Echocardiogram examinations.                 CAD, Abnormal ECG, Arrythmias:Atrial Fibrillation and                  Bradycardia, Signs/Symptoms:Syncope; Risk Factors:Hypertension.                 Metastatic cancer.  Sonographer:    Sheralyn Boatman RDCS Referring Phys: [id number] [facility]  GOEL  Sonographer Comments: Technically difficult study due to poor echo windows. IMPRESSIONS  1. Global hypokinesis with abnormal septal motion. Left ventricular ejection fraction, by estimation, is 45 to 50%. The left ventricle has mildly decreased function. The left ventricle demonstrates global hypokinesis. Left ventricular diastolic parameters are indeterminate.  2. Right ventricular systolic function is normal. The right ventricular size is normal. Tricuspid regurgitation signal is inadequate for assessing PA pressure. The estimated right ventricular systolic pressure is 16.7 mmHg.  3. The mitral valve is abnormal. Mild mitral valve regurgitation. No evidence of mitral stenosis.  4. The aortic valve is tricuspid. There is moderate calcification of the aortic valve. There is moderate thickening of the aortic valve. Aortic valve regurgitation is mild. Aortic valve sclerosis is present, with no evidence of aortic valve stenosis.  5. The inferior vena cava is normal in size with greater than 50% respiratory variability, suggesting right atrial pressure of 3 mmHg.  6. Agitated saline contrast bubble study was negative, with no evidence of any interatrial shunt. FINDINGS  Left Ventricle: Global hypokinesis with abnormal septal motion. Left ventricular ejection fraction, by estimation, is 45 to 50%. The left ventricle has mildly decreased function. The left ventricle demonstrates global hypokinesis. Definity contrast agent was given IV to delineate the left ventricular endocardial borders. Strain was performed and the global longitudinal strain is indeterminate. The left ventricular internal cavity size was normal in size. There is no left ventricular hypertrophy. Left ventricular diastolic parameters are indeterminate. Right Ventricle: The right  ventricular size is normal. No increase in right ventricular wall thickness. Right ventricular systolic function is normal. Tricuspid regurgitation signal is inadequate for assessing PA pressure. The tricuspid regurgitant velocity is 1.85 m/s, and with an assumed right atrial pressure of 3 mmHg, the estimated right ventricular systolic pressure is 16.7 mmHg. Left Atrium: Left atrial size was normal in size. Right Atrium: Right atrial size was normal in size. Pericardium: There is no evidence of pericardial effusion. Mitral Valve: The mitral valve is abnormal. There is mild thickening of the mitral valve leaflet(s). There is mild calcification of the mitral valve leaflet(s). Mild mitral valve regurgitation. No evidence of mitral valve stenosis. Tricuspid Valve: The tricuspid valve is normal in structure. Tricuspid valve regurgitation is not demonstrated. No evidence of tricuspid stenosis. Aortic Valve: The aortic valve is tricuspid. There is moderate calcification of the aortic valve. There is moderate thickening of the aortic valve. Aortic valve regurgitation is mild. Aortic valve sclerosis is present, with no evidence of aortic valve stenosis. Pulmonic Valve: The pulmonic valve was normal in structure. Pulmonic valve regurgitation is not visualized. No evidence of pulmonic stenosis. Aorta: The aortic root is normal in size and structure. Venous: The inferior vena cava is normal in size with greater than 50% respiratory variability, suggesting right atrial pressure of 3 mmHg. IAS/Shunts: No atrial level shunt detected by color flow Doppler. Agitated saline contrast bubble study was negative, with no evidence of any interatrial shunt. Additional Comments: 3D was performed not requiring image post processing on an independent workstation and was indeterminate.  LEFT VENTRICLE PLAX 2D LVIDd:         4.10 cm LVIDs:         3.20 cm LV PW:         1.00 cm LV IVS:        1.10 cm LVOT diam:     2.00 cm LV SV:         39 LV  SV Index:  23 LVOT Area:     3.14 cm  LV Volumes (MOD) LV vol d, MOD A2C: 55.7 ml LV vol d, MOD A4C: 91.1 ml LV vol s, MOD A2C: 35.4 ml LV vol s, MOD A4C: 51.6 ml LV SV MOD A2C:     20.3 ml LV SV MOD A4C:     91.1 ml LV SV MOD BP:      29.6 ml RIGHT VENTRICLE             IVC RV S prime:     10.40 cm/s  IVC diam: 1.50 cm TAPSE (M-mode): 1.6 cm LEFT ATRIUM             Index        RIGHT ATRIUM           Index LA diam:        3.20 cm 1.85 cm/m   RA Area:     15.20 cm LA Vol (A2C):   30.6 ml 17.70 ml/m  RA Volume:   39.50 ml  22.85 ml/m LA Vol (A4C):   34.2 ml 19.79 ml/m LA Biplane Vol: 32.5 ml 18.80 ml/m  AORTIC VALVE LVOT Vmax:   78.65 cm/s LVOT Vmean:  52.050 cm/s LVOT VTI:    0.124 m  AORTA Ao Root diam: 3.10 cm Ao Asc diam:  3.10 cm MITRAL VALVE                TRICUSPID VALVE MV Area (PHT): 4.49 cm     TR Peak grad:   13.7 mmHg MV Decel Time: 169 msec     TR Vmax:        185.00 cm/s MV E velocity: 118.00 cm/s                             SHUNTS                             Systemic VTI:  0.12 m                             Systemic Diam: 2.00 cm Charlton Haws MD Electronically signed by Charlton Haws MD Signature Date/Time: 02/10/2024/1:22:38 PM    Final     Microbiology: Results for orders placed or performed during the hospital encounter of 02/09/24  MRSA Next Gen by PCR, Nasal     Status: None   Collection Time: 02/10/24 12:17 AM   Specimen: Nasal Mucosa; Nasal Swab  Result Value Ref Range Status   MRSA by PCR Next Gen NOT DETECTED NOT DETECTED Final    Comment: (NOTE) The GeneXpert MRSA Assay (FDA approved for NASAL specimens only), is one component of a comprehensive MRSA colonization surveillance program. It is not intended to diagnose MRSA infection nor to guide or monitor treatment for MRSA infections. Test performance is not FDA approved in patients less than 28 years old. Performed at [facility], [address] [place], [place] [address]   C Difficile Quick  Screen w PCR reflex     Status: None   Collection Time: 02/11/24  5:56 PM   Specimen: STOOL  Result Value Ref Range Status   C Diff antigen NEGATIVE NEGATIVE Final   C Diff toxin NEGATIVE NEGATIVE Final   C Diff interpretation No C. difficile detected.  Final    Comment: Performed at  [facility], [address] [place], [place] [address]  Gastrointestinal Panel by PCR , Stool     Status: Abnormal   Collection Time: 02/11/24  5:56 PM   Specimen: STOOL  Result Value Ref Range Status   Campylobacter species NOT DETECTED NOT DETECTED Final   Plesimonas shigelloides NOT DETECTED NOT DETECTED Final   Salmonella species NOT DETECTED NOT DETECTED Final   Yersinia enterocolitica NOT DETECTED NOT DETECTED Final   Vibrio species NOT DETECTED NOT DETECTED Final   Vibrio cholerae NOT DETECTED NOT DETECTED Final   Enteroaggregative E coli (EAEC) NOT DETECTED NOT DETECTED Final   Enteropathogenic E coli (EPEC) NOT DETECTED NOT DETECTED Final   Enterotoxigenic E coli (ETEC) NOT DETECTED NOT DETECTED Final   Shiga like toxin producing E coli (STEC) NOT DETECTED NOT DETECTED Final   Shigella/Enteroinvasive E coli (EIEC) NOT DETECTED NOT DETECTED Final   Cryptosporidium NOT DETECTED NOT DETECTED Final   Cyclospora cayetanensis NOT DETECTED NOT DETECTED Final   Entamoeba histolytica NOT DETECTED NOT DETECTED Final   Giardia lamblia NOT DETECTED NOT DETECTED Final   Adenovirus F40/41 NOT DETECTED NOT DETECTED Final   Astrovirus NOT DETECTED NOT DETECTED Final   Norovirus GI/GII DETECTED (A) NOT DETECTED Final    Comment: RESULT CALLED TO, READ BACK BY AND VERIFIED WITH: LISA MCKNIGHT 02/12/24 1443 KLW    Rotavirus A NOT DETECTED NOT DETECTED Final   Sapovirus (I, II, IV, and V) NOT DETECTED NOT DETECTED Final    Comment: Performed at [facility], [address] [place], [place] [address]    Labs: CBC: Recent Labs  Lab 02/09/24 1743 02/10/24 0458 02/11/24 0414  02/13/24 0400 02/15/24 0416  WBC 8.2 6.3 4.1 4.7 7.2  NEUTROABS 7.7  --  2.4 2.9  --   HGB 16.0 13.7 13.1 13.4 14.0  HCT 51.0 43.7 41.8 42.1 44.3  MCV 97.3 96.3 96.1 95.0 96.9  PLT 128* 107* 93* 110* 126*   Basic Metabolic Panel: Recent Labs  Lab 02/09/24 1743 02/10/24 0458 02/11/24 0414 02/13/24 0400 02/14/24 0428 02/15/24 0416  NA 139 138 141 138 138 139  K 4.8 4.0 3.9 3.2* 4.1 4.8  CL 107 108 111 108 108 108  CO2 21* 21* 20* 19* 24 24  GLUCOSE 161* 117* 82 124* 127* 131*  BUN 30* 30* 25* 17 15 16   CREATININE 1.35* 1.25* 1.10 1.12 1.14 1.19  CALCIUM 8.4* 7.9* 8.1* 8.3* 8.3* 8.6*  MG 2.1  --   --  1.8  --   --    Liver Function Tests: No results for input(s): "AST", "ALT", "ALKPHOS", "BILITOT", "PROT", "ALBUMIN" in the last 168 hours. CBG: No results for input(s): "GLUCAP" in the last 168 hours.  Discharge time spent: greater than 30 minutes.  Signed: Meredeth Ide, MD [facility] 02/15/2024

## 2024-02-15 NOTE — TOC Transition Note (Signed)
 Transition of Care [facility]) - Discharge Note   Patient Details  Name: Donald Todd MRN: [id number] Date of Birth: 1929/01/16  Transition of [facility]) CM/SW Contact:  Larrie Kass, LCSW Phone Number: 02/15/2024, 1:56 PM   Clinical Narrative:     CSW spoke with Manuela Neptune, the administrator at [facility]. She is requesting to review the FL2. CSW spoke with the administrator, who is asking CSW to update the p's FL2. She is also requesting to speak with the pt's RN. CSW provided the report number [phone]. Per pt's RN, pt's daughter will provide transport upon d/c. Updated FL2 has been faxed back to facility.   Pt's Home Health orders has been faxed out to [facility]. TOC sign off.       Patient Goals and CMS Choice            Discharge Placement                       Discharge Plan and Services Additional resources added to the After Visit Summary for                                       Social Drivers of Health (SDOH) Interventions SDOH Screenings   Food Insecurity: No Food Insecurity (02/09/2024)  Housing: Low Risk  (02/10/2024)  Transportation Needs: No Transportation Needs (02/09/2024)  Utilities: Not At Risk (02/09/2024)  Depression (PHQ2-9): Low Risk  (04/04/2023)  Social Connections: Moderately Integrated (02/09/2024)  Tobacco Use: Medium Risk (02/10/2024)     Readmission Risk Interventions     No data to display

## 2024-02-15 NOTE — Progress Notes (Addendum)
 Confirmed the CODE STATUS with patient.  He wants to be full DNR, does not want chest compressions or intubation, no cardioversion or defibrillation.  Will update the DNR order.

## 2024-02-15 NOTE — Progress Notes (Signed)
 Attempted to call [facility] twice to give report

## 2024-02-15 NOTE — Progress Notes (Signed)
 Nsg Discharge Note  Admit Date:  02/09/2024 Discharge date: 02/15/2024   [facility] to be D/C'd Nursing Home per MD order.  AVS completed.  Copy for chart, and copy for patient signed, and dated. Patient/caregiver able to verbalize understanding.  Discharge Medication: Allergies as of 02/15/2024       Reactions   Tape Hives   Surgical tape - causes blisters   Hydrocodone    Shaking uncontrollably    Iohexol     Desc: PT DOES RECALL REACTION JUST SAYS IT WAS A LONG TIME AGO FOR KIDNEY X-RAYS AND HE DIDN'T TOLERATE IT WELL-ARS 05/02/09-NOTE E-CHART STATES A HOT FEELING IS HIS REACTION, BUT HE CAN'T CONFIRM THAT WAS ALL   Oxycodone Other (See Comments)   Shaking uncontrollably         Medication List     STOP taking these medications    aspirin 81 MG tablet   enalapril 5 MG tablet Commonly known as: Vasotec       TAKE these medications    acetaminophen 325 MG tablet Commonly known as: TYLENOL Take 1-2 tablets (325-650 mg total) by mouth every 4 (four) hours as needed for mild pain.   amLODipine 2.5 MG tablet Commonly known as: NORVASC Take 1 tablet (2.5 mg total) by mouth daily.   apixaban 5 MG Tabs tablet Commonly known as: ELIQUIS Take 1 tablet (5 mg total) by mouth 2 (two) times daily.   atorvastatin 20 MG tablet Commonly known as: LIPITOR Take 20 mg by mouth daily. What changed: Another medication with the same name was removed. Continue taking this medication, and follow the directions you see here.   calcium carbonate 500 MG chewable tablet Commonly known as: TUMS - dosed in mg elemental calcium Chew 1,000 mg by mouth daily as needed for indigestion or heartburn.   docusate sodium 100 MG capsule Commonly known as: COLACE Take 1 capsule (100 mg total) by mouth every 12 (twelve) hours. What changed:  how much to take when to take this   ipratropium 0.03 % nasal spray Commonly known as: ATROVENT Place 1-2 sprays into both nostrils 2 (two) times  daily as needed (allergies).   loperamide 2 MG tablet Commonly known as: IMODIUM A-D Take 2 mg by mouth every 6 (six) hours as needed for diarrhea or loose stools.   loratadine 10 MG tablet Commonly known as: CLARITIN Take 10 mg by mouth daily.   meclizine 25 MG tablet Commonly known as: ANTIVERT Take 25 mg by mouth as needed for dizziness.   metoprolol succinate 25 MG 24 hr tablet Commonly known as: TOPROL-XL Take 3 tablets (75 mg total) by mouth daily. Start taking on: February 16, 2024 What changed: how much to take   nitroGLYCERIN 0.4 MG SL tablet Commonly known as: NITROSTAT Place 1 tablet (0.4 mg total) under the tongue every 5 (five) minutes as needed for chest pain (Max 3 doeses). What changed:  when to take this reasons to take this   polyethylene glycol 17 g packet Commonly known as: MIRALAX / GLYCOLAX Take 17 g by mouth daily.   predniSONE 10 MG tablet Commonly known as: DELTASONE Take 10 mg by mouth every morning.   PreserVision AREDS 2 Caps Take 1 capsule by mouth 2 (two) times daily.   QUEtiapine 25 MG tablet Commonly known as: SEROQUEL Take 1 tablet (25 mg total) by mouth at bedtime.   tamsulosin 0.4 MG Caps capsule Commonly known as: FLOMAX Take 1 capsule (0.4 mg total) by  mouth daily after supper. What changed: how much to take        Discharge Assessment: Vitals:   02/15/24 0259 02/15/24 1300  BP: (!) 159/92 (!) 131/96  Pulse:  79  Resp: 16 17  Temp: 97.8 F (36.6 C) 97.7 F (36.5 C)  SpO2: 100% 98%   Skin clean, dry and intact without evidence of skin break down, no evidence of skin tears noted. IV catheter discontinued intact. Site without signs and symptoms of complications - no redness or edema noted at insertion site, patient denies c/o pain - only slight tenderness at site.  Dressing with slight pressure applied.  D/c Instructions-Education: Discharge instructions given to patient/family with verbalized understanding. D/c  education completed with patient/family including follow up instructions, medication list, d/c activities limitations if indicated, with other d/c instructions as indicated by MD - patient able to verbalize understanding, all questions fully answered. Patient instructed to return to ED, call 911, or call MD for any changes in condition.  Patient escorted via WC, and D/C home via private auto.  [facility] Todd, Tilford Pillar, RN 02/15/2024 4:27 PMNsg Discharge Note  Admit Date:  02/09/2024 Discharge date: 02/15/2024   [facility] Todd to be D/C'd Skilled nursing facility per MD order.  AVS completed.  Patient/caregiver able to verbalize understanding.  Discharge Medication: Allergies as of 02/15/2024       Reactions   Tape Hives   Surgical tape - causes blisters   Hydrocodone    Shaking uncontrollably    Iohexol     Desc: PT DOES RECALL REACTION JUST SAYS IT WAS A LONG TIME AGO FOR KIDNEY X-RAYS AND HE DIDN'T TOLERATE IT WELL-ARS 05/02/09-NOTE E-CHART STATES A HOT FEELING IS HIS REACTION, BUT HE CAN'T CONFIRM THAT WAS ALL   Oxycodone Other (See Comments)   Shaking uncontrollably         Medication List     STOP taking these medications    aspirin 81 MG tablet   enalapril 5 MG tablet Commonly known as: Vasotec       TAKE these medications    acetaminophen 325 MG tablet Commonly known as: TYLENOL Take 1-2 tablets (325-650 mg total) by mouth every 4 (four) hours as needed for mild pain.   amLODipine 2.5 MG tablet Commonly known as: NORVASC Take 1 tablet (2.5 mg total) by mouth daily.   apixaban 5 MG Tabs tablet Commonly known as: ELIQUIS Take 1 tablet (5 mg total) by mouth 2 (two) times daily.   atorvastatin 20 MG tablet Commonly known as: LIPITOR Take 20 mg by mouth daily. What changed: Another medication with the same name was removed. Continue taking this medication, and follow the directions you see here.   calcium carbonate 500 MG chewable tablet Commonly known as:  TUMS - dosed in mg elemental calcium Chew 1,000 mg by mouth daily as needed for indigestion or heartburn.   docusate sodium 100 MG capsule Commonly known as: COLACE Take 1 capsule (100 mg total) by mouth every 12 (twelve) hours. What changed:  how much to take when to take this   ipratropium 0.03 % nasal spray Commonly known as: ATROVENT Place 1-2 sprays into both nostrils 2 (two) times daily as needed (allergies).   loperamide 2 MG tablet Commonly known as: IMODIUM A-D Take 2 mg by mouth every 6 (six) hours as needed for diarrhea or loose stools.   loratadine 10 MG tablet Commonly known as: CLARITIN Take 10 mg by mouth daily.   meclizine 25 MG  tablet Commonly known as: ANTIVERT Take 25 mg by mouth as needed for dizziness.   metoprolol succinate 25 MG 24 hr tablet Commonly known as: TOPROL-XL Take 3 tablets (75 mg total) by mouth daily. Start taking on: February 16, 2024 What changed: how much to take   nitroGLYCERIN 0.4 MG SL tablet Commonly known as: NITROSTAT Place 1 tablet (0.4 mg total) under the tongue every 5 (five) minutes as needed for chest pain (Max 3 doeses). What changed:  when to take this reasons to take this   polyethylene glycol 17 g packet Commonly known as: MIRALAX / GLYCOLAX Take 17 g by mouth daily.   predniSONE 10 MG tablet Commonly known as: DELTASONE Take 10 mg by mouth every morning.   PreserVision AREDS 2 Caps Take 1 capsule by mouth 2 (two) times daily.   QUEtiapine 25 MG tablet Commonly known as: SEROQUEL Take 1 tablet (25 mg total) by mouth at bedtime.   tamsulosin 0.4 MG Caps capsule Commonly known as: FLOMAX Take 1 capsule (0.4 mg total) by mouth daily after supper. What changed: how much to take        Discharge Assessment: Vitals:   02/15/24 0259 02/15/24 1300  BP: (!) 159/92 (!) 131/96  Pulse:  79  Resp: 16 17  Temp: 97.8 F (36.6 C) 97.7 F (36.5 C)  SpO2: 100% 98%   Skin clean, dry and intact without evidence of  skin break down, no evidence of skin tears noted. IV catheter discontinued intact. Site without signs and symptoms of complications - no redness or edema noted at insertion site, patient denies c/o pain - only slight tenderness at site.  Dressing with slight pressure applied.  D/c Instructions-Education: Discharge instructions given to patient/family with verbalized understanding. D/c education completed with patient/family including follow up instructions, medication list, d/c activities limitations if indicated, with other d/c instructions as indicated by MD - patient able to verbalize understanding, all questions fully answered. Patient instructed to return to ED, call 911, or call MD for any changes in condition.  Patient escorted via WC, and [facility]C [facility] auto.  [facility], Tilford Pillar, RN 02/15/2024 4:27 PM

## 2024-02-19 DIAGNOSIS — I252 Old myocardial infarction: Secondary | ICD-10-CM | POA: Diagnosis not present

## 2024-02-19 DIAGNOSIS — G8929 Other chronic pain: Secondary | ICD-10-CM | POA: Diagnosis not present

## 2024-02-20 DIAGNOSIS — M62552 Muscle wasting and atrophy, not elsewhere classified, left thigh: Secondary | ICD-10-CM | POA: Diagnosis not present

## 2024-02-20 DIAGNOSIS — R2689 Other abnormalities of gait and mobility: Secondary | ICD-10-CM | POA: Diagnosis not present

## 2024-02-20 DIAGNOSIS — M62561 Muscle wasting and atrophy, not elsewhere classified, right lower leg: Secondary | ICD-10-CM | POA: Diagnosis not present

## 2024-02-20 DIAGNOSIS — M62551 Muscle wasting and atrophy, not elsewhere classified, right thigh: Secondary | ICD-10-CM | POA: Diagnosis not present

## 2024-02-20 DIAGNOSIS — M62562 Muscle wasting and atrophy, not elsewhere classified, left lower leg: Secondary | ICD-10-CM | POA: Diagnosis not present

## 2024-02-21 DIAGNOSIS — M62552 Muscle wasting and atrophy, not elsewhere classified, left thigh: Secondary | ICD-10-CM | POA: Diagnosis not present

## 2024-02-21 DIAGNOSIS — M62562 Muscle wasting and atrophy, not elsewhere classified, left lower leg: Secondary | ICD-10-CM | POA: Diagnosis not present

## 2024-02-21 DIAGNOSIS — M62551 Muscle wasting and atrophy, not elsewhere classified, right thigh: Secondary | ICD-10-CM | POA: Diagnosis not present

## 2024-02-21 DIAGNOSIS — M62561 Muscle wasting and atrophy, not elsewhere classified, right lower leg: Secondary | ICD-10-CM | POA: Diagnosis not present

## 2024-02-21 DIAGNOSIS — R2689 Other abnormalities of gait and mobility: Secondary | ICD-10-CM | POA: Diagnosis not present

## 2024-02-26 DIAGNOSIS — F331 Major depressive disorder, recurrent, moderate: Secondary | ICD-10-CM | POA: Diagnosis not present

## 2024-02-26 DIAGNOSIS — G8929 Other chronic pain: Secondary | ICD-10-CM | POA: Diagnosis not present

## 2024-02-26 DIAGNOSIS — F411 Generalized anxiety disorder: Secondary | ICD-10-CM | POA: Diagnosis not present

## 2024-02-26 DIAGNOSIS — R441 Visual hallucinations: Secondary | ICD-10-CM | POA: Diagnosis not present

## 2024-02-26 DIAGNOSIS — G3184 Mild cognitive impairment, so stated: Secondary | ICD-10-CM | POA: Diagnosis not present

## 2024-03-01 DIAGNOSIS — R42 Dizziness and giddiness: Secondary | ICD-10-CM | POA: Diagnosis not present

## 2024-03-01 DIAGNOSIS — I1310 Hypertensive heart and chronic kidney disease without heart failure, with stage 1 through stage 4 chronic kidney disease, or unspecified chronic kidney disease: Secondary | ICD-10-CM | POA: Diagnosis not present

## 2024-03-01 DIAGNOSIS — M6281 Muscle weakness (generalized): Secondary | ICD-10-CM | POA: Diagnosis not present

## 2024-03-01 DIAGNOSIS — R7303 Prediabetes: Secondary | ICD-10-CM | POA: Diagnosis not present

## 2024-03-01 DIAGNOSIS — M159 Polyosteoarthritis, unspecified: Secondary | ICD-10-CM | POA: Diagnosis not present

## 2024-03-01 DIAGNOSIS — J309 Allergic rhinitis, unspecified: Secondary | ICD-10-CM | POA: Diagnosis not present

## 2024-03-01 DIAGNOSIS — E785 Hyperlipidemia, unspecified: Secondary | ICD-10-CM | POA: Diagnosis not present

## 2024-03-01 DIAGNOSIS — N1831 Chronic kidney disease, stage 3a: Secondary | ICD-10-CM | POA: Diagnosis not present

## 2024-03-01 DIAGNOSIS — K5900 Constipation, unspecified: Secondary | ICD-10-CM | POA: Diagnosis not present

## 2024-03-01 DIAGNOSIS — H353130 Nonexudative age-related macular degeneration, bilateral, stage unspecified: Secondary | ICD-10-CM | POA: Diagnosis not present

## 2024-03-04 ENCOUNTER — Ambulatory Visit (HOSPITAL_COMMUNITY): Admitting: Physician Assistant

## 2024-03-04 DIAGNOSIS — M62561 Muscle wasting and atrophy, not elsewhere classified, right lower leg: Secondary | ICD-10-CM | POA: Diagnosis not present

## 2024-03-04 DIAGNOSIS — M62552 Muscle wasting and atrophy, not elsewhere classified, left thigh: Secondary | ICD-10-CM | POA: Diagnosis not present

## 2024-03-04 DIAGNOSIS — R2689 Other abnormalities of gait and mobility: Secondary | ICD-10-CM | POA: Diagnosis not present

## 2024-03-04 DIAGNOSIS — M62562 Muscle wasting and atrophy, not elsewhere classified, left lower leg: Secondary | ICD-10-CM | POA: Diagnosis not present

## 2024-03-04 DIAGNOSIS — M62551 Muscle wasting and atrophy, not elsewhere classified, right thigh: Secondary | ICD-10-CM | POA: Diagnosis not present

## 2024-03-05 ENCOUNTER — Telehealth: Payer: Self-pay | Admitting: Cardiovascular Disease

## 2024-03-05 NOTE — Telephone Encounter (Signed)
 Pt came in by accident a month early, but states he had a recent hospital visit where they change some medication and was wondering if by chance he needed to come in sooner than 5/1 with APP

## 2024-03-07 DIAGNOSIS — H353231 Exudative age-related macular degeneration, bilateral, with active choroidal neovascularization: Secondary | ICD-10-CM | POA: Diagnosis not present

## 2024-03-11 DIAGNOSIS — M62562 Muscle wasting and atrophy, not elsewhere classified, left lower leg: Secondary | ICD-10-CM | POA: Diagnosis not present

## 2024-03-11 DIAGNOSIS — R2689 Other abnormalities of gait and mobility: Secondary | ICD-10-CM | POA: Diagnosis not present

## 2024-03-11 DIAGNOSIS — M62551 Muscle wasting and atrophy, not elsewhere classified, right thigh: Secondary | ICD-10-CM | POA: Diagnosis not present

## 2024-03-11 DIAGNOSIS — M62561 Muscle wasting and atrophy, not elsewhere classified, right lower leg: Secondary | ICD-10-CM | POA: Diagnosis not present

## 2024-03-11 DIAGNOSIS — M62552 Muscle wasting and atrophy, not elsewhere classified, left thigh: Secondary | ICD-10-CM | POA: Diagnosis not present

## 2024-03-12 DIAGNOSIS — M62562 Muscle wasting and atrophy, not elsewhere classified, left lower leg: Secondary | ICD-10-CM | POA: Diagnosis not present

## 2024-03-12 DIAGNOSIS — M62551 Muscle wasting and atrophy, not elsewhere classified, right thigh: Secondary | ICD-10-CM | POA: Diagnosis not present

## 2024-03-12 DIAGNOSIS — M62561 Muscle wasting and atrophy, not elsewhere classified, right lower leg: Secondary | ICD-10-CM | POA: Diagnosis not present

## 2024-03-12 DIAGNOSIS — R2689 Other abnormalities of gait and mobility: Secondary | ICD-10-CM | POA: Diagnosis not present

## 2024-03-12 DIAGNOSIS — M62552 Muscle wasting and atrophy, not elsewhere classified, left thigh: Secondary | ICD-10-CM | POA: Diagnosis not present

## 2024-03-13 DIAGNOSIS — R2689 Other abnormalities of gait and mobility: Secondary | ICD-10-CM | POA: Diagnosis not present

## 2024-03-13 DIAGNOSIS — M62562 Muscle wasting and atrophy, not elsewhere classified, left lower leg: Secondary | ICD-10-CM | POA: Diagnosis not present

## 2024-03-13 DIAGNOSIS — M62552 Muscle wasting and atrophy, not elsewhere classified, left thigh: Secondary | ICD-10-CM | POA: Diagnosis not present

## 2024-03-13 DIAGNOSIS — M62551 Muscle wasting and atrophy, not elsewhere classified, right thigh: Secondary | ICD-10-CM | POA: Diagnosis not present

## 2024-03-13 DIAGNOSIS — M62561 Muscle wasting and atrophy, not elsewhere classified, right lower leg: Secondary | ICD-10-CM | POA: Diagnosis not present

## 2024-03-19 DIAGNOSIS — M62562 Muscle wasting and atrophy, not elsewhere classified, left lower leg: Secondary | ICD-10-CM | POA: Diagnosis not present

## 2024-03-19 DIAGNOSIS — R2689 Other abnormalities of gait and mobility: Secondary | ICD-10-CM | POA: Diagnosis not present

## 2024-03-19 DIAGNOSIS — M62561 Muscle wasting and atrophy, not elsewhere classified, right lower leg: Secondary | ICD-10-CM | POA: Diagnosis not present

## 2024-03-19 DIAGNOSIS — M62551 Muscle wasting and atrophy, not elsewhere classified, right thigh: Secondary | ICD-10-CM | POA: Diagnosis not present

## 2024-03-19 DIAGNOSIS — N400 Enlarged prostate without lower urinary tract symptoms: Secondary | ICD-10-CM | POA: Diagnosis not present

## 2024-03-19 DIAGNOSIS — R7303 Prediabetes: Secondary | ICD-10-CM | POA: Diagnosis not present

## 2024-03-19 DIAGNOSIS — E7800 Pure hypercholesterolemia, unspecified: Secondary | ICD-10-CM | POA: Diagnosis not present

## 2024-03-19 DIAGNOSIS — K5900 Constipation, unspecified: Secondary | ICD-10-CM | POA: Diagnosis not present

## 2024-03-19 DIAGNOSIS — M62552 Muscle wasting and atrophy, not elsewhere classified, left thigh: Secondary | ICD-10-CM | POA: Diagnosis not present

## 2024-03-20 DIAGNOSIS — M62551 Muscle wasting and atrophy, not elsewhere classified, right thigh: Secondary | ICD-10-CM | POA: Diagnosis not present

## 2024-03-20 DIAGNOSIS — M62562 Muscle wasting and atrophy, not elsewhere classified, left lower leg: Secondary | ICD-10-CM | POA: Diagnosis not present

## 2024-03-20 DIAGNOSIS — R2689 Other abnormalities of gait and mobility: Secondary | ICD-10-CM | POA: Diagnosis not present

## 2024-03-20 DIAGNOSIS — M62552 Muscle wasting and atrophy, not elsewhere classified, left thigh: Secondary | ICD-10-CM | POA: Diagnosis not present

## 2024-03-20 DIAGNOSIS — M62561 Muscle wasting and atrophy, not elsewhere classified, right lower leg: Secondary | ICD-10-CM | POA: Diagnosis not present

## 2024-03-21 DIAGNOSIS — M62562 Muscle wasting and atrophy, not elsewhere classified, left lower leg: Secondary | ICD-10-CM | POA: Diagnosis not present

## 2024-03-21 DIAGNOSIS — R2689 Other abnormalities of gait and mobility: Secondary | ICD-10-CM | POA: Diagnosis not present

## 2024-03-21 DIAGNOSIS — M62551 Muscle wasting and atrophy, not elsewhere classified, right thigh: Secondary | ICD-10-CM | POA: Diagnosis not present

## 2024-03-21 DIAGNOSIS — M62552 Muscle wasting and atrophy, not elsewhere classified, left thigh: Secondary | ICD-10-CM | POA: Diagnosis not present

## 2024-03-21 DIAGNOSIS — M62561 Muscle wasting and atrophy, not elsewhere classified, right lower leg: Secondary | ICD-10-CM | POA: Diagnosis not present

## 2024-03-25 DIAGNOSIS — M62562 Muscle wasting and atrophy, not elsewhere classified, left lower leg: Secondary | ICD-10-CM | POA: Diagnosis not present

## 2024-03-25 DIAGNOSIS — R2689 Other abnormalities of gait and mobility: Secondary | ICD-10-CM | POA: Diagnosis not present

## 2024-03-25 DIAGNOSIS — M62561 Muscle wasting and atrophy, not elsewhere classified, right lower leg: Secondary | ICD-10-CM | POA: Diagnosis not present

## 2024-03-25 DIAGNOSIS — M62552 Muscle wasting and atrophy, not elsewhere classified, left thigh: Secondary | ICD-10-CM | POA: Diagnosis not present

## 2024-03-25 DIAGNOSIS — M62551 Muscle wasting and atrophy, not elsewhere classified, right thigh: Secondary | ICD-10-CM | POA: Diagnosis not present

## 2024-03-26 DIAGNOSIS — R2689 Other abnormalities of gait and mobility: Secondary | ICD-10-CM | POA: Diagnosis not present

## 2024-03-26 DIAGNOSIS — M62561 Muscle wasting and atrophy, not elsewhere classified, right lower leg: Secondary | ICD-10-CM | POA: Diagnosis not present

## 2024-03-26 DIAGNOSIS — M62552 Muscle wasting and atrophy, not elsewhere classified, left thigh: Secondary | ICD-10-CM | POA: Diagnosis not present

## 2024-03-26 DIAGNOSIS — M62551 Muscle wasting and atrophy, not elsewhere classified, right thigh: Secondary | ICD-10-CM | POA: Diagnosis not present

## 2024-03-26 DIAGNOSIS — M62562 Muscle wasting and atrophy, not elsewhere classified, left lower leg: Secondary | ICD-10-CM | POA: Diagnosis not present

## 2024-03-27 DIAGNOSIS — M62552 Muscle wasting and atrophy, not elsewhere classified, left thigh: Secondary | ICD-10-CM | POA: Diagnosis not present

## 2024-03-27 DIAGNOSIS — M62562 Muscle wasting and atrophy, not elsewhere classified, left lower leg: Secondary | ICD-10-CM | POA: Diagnosis not present

## 2024-03-27 DIAGNOSIS — M62551 Muscle wasting and atrophy, not elsewhere classified, right thigh: Secondary | ICD-10-CM | POA: Diagnosis not present

## 2024-03-27 DIAGNOSIS — R2689 Other abnormalities of gait and mobility: Secondary | ICD-10-CM | POA: Diagnosis not present

## 2024-03-27 DIAGNOSIS — M62561 Muscle wasting and atrophy, not elsewhere classified, right lower leg: Secondary | ICD-10-CM | POA: Diagnosis not present

## 2024-03-27 DIAGNOSIS — I252 Old myocardial infarction: Secondary | ICD-10-CM | POA: Diagnosis not present

## 2024-03-27 DIAGNOSIS — G8929 Other chronic pain: Secondary | ICD-10-CM | POA: Diagnosis not present

## 2024-04-01 DIAGNOSIS — M62552 Muscle wasting and atrophy, not elsewhere classified, left thigh: Secondary | ICD-10-CM | POA: Diagnosis not present

## 2024-04-01 DIAGNOSIS — R2689 Other abnormalities of gait and mobility: Secondary | ICD-10-CM | POA: Diagnosis not present

## 2024-04-01 DIAGNOSIS — M62551 Muscle wasting and atrophy, not elsewhere classified, right thigh: Secondary | ICD-10-CM | POA: Diagnosis not present

## 2024-04-01 DIAGNOSIS — M62561 Muscle wasting and atrophy, not elsewhere classified, right lower leg: Secondary | ICD-10-CM | POA: Diagnosis not present

## 2024-04-01 DIAGNOSIS — M62562 Muscle wasting and atrophy, not elsewhere classified, left lower leg: Secondary | ICD-10-CM | POA: Diagnosis not present

## 2024-04-02 DIAGNOSIS — R2689 Other abnormalities of gait and mobility: Secondary | ICD-10-CM | POA: Diagnosis not present

## 2024-04-02 DIAGNOSIS — M62552 Muscle wasting and atrophy, not elsewhere classified, left thigh: Secondary | ICD-10-CM | POA: Diagnosis not present

## 2024-04-02 DIAGNOSIS — M62562 Muscle wasting and atrophy, not elsewhere classified, left lower leg: Secondary | ICD-10-CM | POA: Diagnosis not present

## 2024-04-02 DIAGNOSIS — M62551 Muscle wasting and atrophy, not elsewhere classified, right thigh: Secondary | ICD-10-CM | POA: Diagnosis not present

## 2024-04-02 DIAGNOSIS — M62561 Muscle wasting and atrophy, not elsewhere classified, right lower leg: Secondary | ICD-10-CM | POA: Diagnosis not present

## 2024-04-04 ENCOUNTER — Encounter: Payer: Self-pay | Admitting: Nurse Practitioner

## 2024-04-04 ENCOUNTER — Ambulatory Visit: Payer: Medicare Other | Admitting: Nurse Practitioner

## 2024-04-04 VITALS — BP 124/70 | HR 99 | Ht 67.0 in | Wt 139.0 lb

## 2024-04-04 DIAGNOSIS — E785 Hyperlipidemia, unspecified: Secondary | ICD-10-CM | POA: Diagnosis not present

## 2024-04-04 DIAGNOSIS — Z79899 Other long term (current) drug therapy: Secondary | ICD-10-CM | POA: Diagnosis not present

## 2024-04-04 DIAGNOSIS — I10 Essential (primary) hypertension: Secondary | ICD-10-CM | POA: Diagnosis not present

## 2024-04-04 DIAGNOSIS — I4819 Other persistent atrial fibrillation: Secondary | ICD-10-CM | POA: Insufficient documentation

## 2024-04-04 DIAGNOSIS — R2689 Other abnormalities of gait and mobility: Secondary | ICD-10-CM | POA: Diagnosis not present

## 2024-04-04 DIAGNOSIS — D6859 Other primary thrombophilia: Secondary | ICD-10-CM | POA: Diagnosis not present

## 2024-04-04 DIAGNOSIS — M62552 Muscle wasting and atrophy, not elsewhere classified, left thigh: Secondary | ICD-10-CM | POA: Diagnosis not present

## 2024-04-04 DIAGNOSIS — I429 Cardiomyopathy, unspecified: Secondary | ICD-10-CM | POA: Diagnosis not present

## 2024-04-04 DIAGNOSIS — I2510 Atherosclerotic heart disease of native coronary artery without angina pectoris: Secondary | ICD-10-CM | POA: Diagnosis not present

## 2024-04-04 DIAGNOSIS — M62561 Muscle wasting and atrophy, not elsewhere classified, right lower leg: Secondary | ICD-10-CM | POA: Diagnosis not present

## 2024-04-04 DIAGNOSIS — M62562 Muscle wasting and atrophy, not elsewhere classified, left lower leg: Secondary | ICD-10-CM | POA: Diagnosis not present

## 2024-04-04 DIAGNOSIS — M62551 Muscle wasting and atrophy, not elsewhere classified, right thigh: Secondary | ICD-10-CM | POA: Diagnosis not present

## 2024-04-04 NOTE — Progress Notes (Unsigned)
 Office Visit    Patient Name: Donald Todd Date of Encounter: 04/04/2024  Primary Care Provider:  No primary care provider on file. Primary Cardiologist:  Lauro Portal, MD  Chief Complaint    89 year old male with a history of CAD s/p prior stenting-RCA and ramus 1997, persistent atrial fibrillation, hypertension, hyperlipidemia, prostate cancer, renal mass s/p left nephrectomy, vertigo, and arthritis who presents for hospital follow-up related to atrial fibrillation.  Past Medical History    Past Medical History:  Diagnosis Date   Arthritis    CAD (coronary artery disease)    last cath. 07-09-2010   Cancer [facility])    kidney cancer , prostate cancer    Chronic kidney disease    lt kidnet removed   H/O cardiovascular stress test 08-04-2010   ef 55% NO ISCHEMIA   H/O unilateral nephrectomy    lt. nephrectomy   Heart attack (HCC) 1993   History of kidney stones    Hyperlipemia    Hypertension    renal dopplers 218.13-normal patency   Inguinal hernia 08/01/11   right   Macular degeneration    Prostate cancer [facility])    Prostate disease    Cancer S/P seed implant   Renal mass    Vertigo    Past Surgical History:  Procedure Laterality Date   ANGIOPLASTY  1993   athroscopic knee surgery  2002   right   bone scan  2005   CARDIAC CATHETERIZATION  07-09-2010   normal left ventricular function, coronary obsstructive disease with 20% proximal an 30-40% mid lt. anterior descending narrowing ; widely patent stent in the proximal ramus intermediate vessel; 50-70-% narrowing in the mid circumflex,and widely patent stent  in the rt. coronary    CARDIAC CATHETERIZATION  12-22-2003   widely patent stents with noncritical coronary artery disease and normalLV function   CHOLECYSTECTOMY  05/05/2009   Laparoscopic   colonscopy  2004   with biopsy   CORONARY ANGIOPLASTY WITH STENT PLACEMENT  03-15-1996   pci to rci and ramus branch using j&j [id number] stent,post dialated  at  20atm (rca)  and  14 atm (ramus branch. ef 55%             HERNIA REPAIR  08/01/11   RIH   INGUINAL HERNIA REPAIR Left 03/29/2013   Procedure: HERNIA REPAIR INGUINAL ADULT;  Surgeon: Quitman Bucy, MD;  Location: [facility] ORS;  Service: General;  Laterality: Left;   INSERTION OF MESH Left 03/29/2013   Procedure: INSERTION OF MESH;  Surgeon: Quitman Bucy, MD;  Location: [facility] ORS;  Service: General;  Laterality: Left;   JOINT REPLACEMENT  2006 and 2010   left 2006, right 2010   KIDNEY SURGERY  2002 and 2005   2002 - partial removal of left. 2005 complete removal   PROSTATE BIOPSY     prostate seed implant     radiation treatment  2002   prostate   REVERSE SHOULDER ARTHROPLASTY Left 05/26/2017   Procedure: REVERSE LEFT TOTAL SHOULDER ARTHROPLASTY;  Surgeon: Winston Hawking, MD;  Location: [facility] OR;  Service: Orthopedics;  Laterality: Left;   stent implants  1997    Allergies  Allergies  Allergen Reactions   Tape Hives    Surgical tape - causes blisters   Hydrocodone      Shaking uncontrollably    Iohexol      Desc: PT DOES RECALL REACTION JUST SAYS IT WAS A LONG TIME AGO FOR KIDNEY X-RAYS AND HE DIDN'T TOLERATE IT WELL-ARS 05/02/09-NOTE  E-CHART STATES A HOT FEELING IS HIS REACTION, BUT HE CAN'T CONFIRM THAT WAS ALL    Oxycodone  Other (See Comments)    Shaking uncontrollably      Labs/Other Studies Reviewed    The following studies were reviewed today:  Cardiac Studies & Procedures   ______________________________________________________________________________________________   STRESS TESTS  MYOCARDIAL PERFUSION IMAGING 05/07/2020  Narrative  The left ventricular ejection fraction is mildly decreased (45-54%).  Nuclear stress EF: 53%.  There was no ST segment deviation noted during stress.  Defect 1: There is a medium defect of moderate severity.  This is a low risk study.  Findings consistent with prior myocardial infarction with peri-infarct ischemia.  Abnormal, low risk stress  nuclear study with prior lateral infarct and minimal peri-infarct ischemia.  Gated ejection fraction 53% with hypokinesis of the lateral wall.   ECHOCARDIOGRAM  ECHOCARDIOGRAM COMPLETE 02/10/2024  Narrative ECHOCARDIOGRAM REPORT    Patient Name:   Donald Todd Date of Exam: 02/10/2024 Medical Rec #:  [id number]        Height:       67.0 in Accession #:    [id number]       Weight:       138.2 lb Date of Birth:  1929/04/06        BSA:          1.728 m Patient Age:    89 years         BP:           132/80 mmHg Patient Gender: M                HR:           82 bpm. Exam Location:  Inpatient  Procedure: 2D Echo, Cardiac Doppler, Color Doppler and Intracardiac Opacification Agent (Both Spectral and Color Flow Doppler were utilized during procedure).  Indications:    I48.91* Unspeicified atrial fibrillation  History:        Patient has no prior history of Echocardiogram examinations. CAD, Abnormal ECG, Arrythmias:Atrial Fibrillation and Bradycardia, Signs/Symptoms:Syncope; Risk Factors:Hypertension. Metastatic cancer.  Sonographer:    Raynelle Callow RDCS Referring Phys: [id number] [facility] GOEL   Sonographer Comments: Technically difficult study due to poor echo windows. IMPRESSIONS   1. Global hypokinesis with abnormal septal motion. Left ventricular ejection fraction, by estimation, is 45 to 50%. The left ventricle has mildly decreased function. The left ventricle demonstrates global hypokinesis. Left ventricular diastolic parameters are indeterminate. 2. Right ventricular systolic function is normal. The right ventricular size is normal. Tricuspid regurgitation signal is inadequate for assessing PA pressure. The estimated right ventricular systolic pressure is 16.7 mmHg. 3. The mitral valve is abnormal. Mild mitral valve regurgitation. No evidence of mitral stenosis. 4. The aortic valve is tricuspid. There is moderate calcification of the aortic valve. There is moderate thickening of the  aortic valve. Aortic valve regurgitation is mild. Aortic valve sclerosis is present, with no evidence of aortic valve stenosis. 5. The inferior vena cava is normal in size with greater than 50% respiratory variability, suggesting right atrial pressure of 3 mmHg. 6. Agitated saline contrast bubble study was negative, with no evidence of any interatrial shunt.  FINDINGS Left Ventricle: Global hypokinesis with abnormal septal motion. Left ventricular ejection fraction, by estimation, is 45 to 50%. The left ventricle has mildly decreased function. The left ventricle demonstrates global hypokinesis. Definity  contrast agent was given IV to delineate the left ventricular endocardial borders. Strain was performed and the global longitudinal strain is indeterminate.  The left ventricular internal cavity size was normal in size. There is no left ventricular hypertrophy. Left ventricular diastolic parameters are indeterminate.  Right Ventricle: The right ventricular size is normal. No increase in right ventricular wall thickness. Right ventricular systolic function is normal. Tricuspid regurgitation signal is inadequate for assessing PA pressure. The tricuspid regurgitant velocity is 1.85 m/s, and with an assumed right atrial pressure of 3 mmHg, the estimated right ventricular systolic pressure is 16.7 mmHg.  Left Atrium: Left atrial size was normal in size.  Right Atrium: Right atrial size was normal in size.  Pericardium: There is no evidence of pericardial effusion.  Mitral Valve: The mitral valve is abnormal. There is mild thickening of the mitral valve leaflet(s). There is mild calcification of the mitral valve leaflet(s). Mild mitral valve regurgitation. No evidence of mitral valve stenosis.  Tricuspid Valve: The tricuspid valve is normal in structure. Tricuspid valve regurgitation is not demonstrated. No evidence of tricuspid stenosis.  Aortic Valve: The aortic valve is tricuspid. There is moderate  calcification of the aortic valve. There is moderate thickening of the aortic valve. Aortic valve regurgitation is mild. Aortic valve sclerosis is present, with no evidence of aortic valve stenosis.  Pulmonic Valve: The pulmonic valve was normal in structure. Pulmonic valve regurgitation is not visualized. No evidence of pulmonic stenosis.  Aorta: The aortic root is normal in size and structure.  Venous: The inferior vena cava is normal in size with greater than 50% respiratory variability, suggesting right atrial pressure of 3 mmHg.  IAS/Shunts: No atrial level shunt detected by color flow Doppler. Agitated saline contrast bubble study was negative, with no evidence of any interatrial shunt.  Additional Comments: 3D was performed not requiring image post processing on an independent workstation and was indeterminate.   LEFT VENTRICLE PLAX 2D LVIDd:         4.10 cm LVIDs:         3.20 cm LV PW:         1.00 cm LV IVS:        1.10 cm LVOT diam:     2.00 cm LV SV:         39 LV SV Index:   23 LVOT Area:     3.14 cm  LV Volumes (MOD) LV vol d, MOD A2C: 55.7 ml LV vol d, MOD A4C: 91.1 ml LV vol s, MOD A2C: 35.4 ml LV vol s, MOD A4C: 51.6 ml LV SV MOD A2C:     20.3 ml LV SV MOD A4C:     91.1 ml LV SV MOD BP:      29.6 ml  RIGHT VENTRICLE             IVC RV S prime:     10.40 cm/s  IVC diam: 1.50 cm TAPSE (M-mode): 1.6 cm  LEFT ATRIUM             Index        RIGHT ATRIUM           Index LA diam:        3.20 cm 1.85 cm/m   RA Area:     15.20 cm LA Vol (A2C):   30.6 ml 17.70 ml/m  RA Volume:   39.50 ml  22.85 ml/m LA Vol (A4C):   34.2 ml 19.79 ml/m LA Biplane Vol: 32.5 ml 18.80 ml/m AORTIC VALVE LVOT Vmax:   78.65 cm/s LVOT Vmean:  52.050 cm/s LVOT VTI:    0.124 m  AORTA Ao Root diam: 3.10 cm Ao Asc diam:  3.10 cm  MITRAL VALVE                TRICUSPID VALVE MV Area (PHT): 4.49 cm     TR Peak grad:   13.7 mmHg MV Decel Time: 169 msec     TR Vmax:        185.00  cm/s MV E velocity: 118.00 cm/s SHUNTS Systemic VTI:  0.12 m Systemic Diam: 2.00 cm  Janelle Mediate MD Electronically signed by Janelle Mediate MD Signature Date/Time: 02/10/2024/1:22:38 PM    Final          ______________________________________________________________________________________________     Recent Labs: 05/04/2023: ALT 24 02/10/2024: TSH 1.208 02/13/2024: Magnesium 1.8 02/15/2024: BUN 16; Creatinine, Ser 1.19; Hemoglobin 14.0; Platelets 126; Potassium 4.8; Sodium 139  Recent Lipid Panel    Component Value Date/Time   CHOL  05/05/2009 0313    124        ATP III CLASSIFICATION:  <200     mg/dL   Desirable  782-956  mg/dL   Borderline High  >=213    mg/dL   High          TRIG 086 05/05/2009 0313   HDL 39 (L) 05/05/2009 0313   CHOLHDL 3.2 05/05/2009 0313   VLDL 23 05/05/2009 0313   LDLCALC  05/05/2009 0313    62        Total Cholesterol/HDL:CHD Risk Coronary Heart Disease Risk Table                     Men   Women  1/2 Average Risk   3.4   3.3  Average Risk       5.0   4.4  2 X Average Risk   9.6   7.1  3 X Average Risk  23.4   11.0        Use the calculated Patient Ratio above and the CHD Risk Table to determine the patient's CHD Risk.        ATP III CLASSIFICATION (LDL):  <100     mg/dL   Optimal  578-469  mg/dL   Near or Above                    Optimal  130-159  mg/dL   Borderline  629-528  mg/dL   High  >413     mg/dL   Very High    History of Present Illness    89 year old male with the above past medical history including CAD s/p prior stenting-RCA and ramus 1997, persistent atrial fibrillation, hypertension, hyperlipidemia, prostate cancer, renal mass s/p left nephrectomy, vertigo, and arthritis.  He has a history of stenting to his RCA and ramus branch in 1997.  Cardiac catheterization in 2005 revealed patent stent, 60% mid LAD lesion, normal LV function.  Myoview  in 2011 revealed lateral scar.  Follow-up cardiac catheterization revealed  patent stents, noncritical CAD.  Myoview  in 2014 showed moderate size lateral wall scar, is consistent with his anatomy.  Repeat Myoview  in 2021 showed lateral scar without ischemia, unchanged from prior Myoview .  He was last seen in the office on 01/05/2024 and was doing well.  His BP was low.  Amlodipine  and enalapril  were down-titrated.  He presented to the ED on 02/09/2024 with nausea vomiting and diarrhea in the setting of Norovirus.  EKG showed atrial fibrillation with RVR.  Troponin was negative x 2.  He was started on  IV diltiazem , Eliquis .  He was treated with IV fluids.  Cardiology was consulted in the setting of new onset atrial fibrillation.  Echocardiogram showed EF 45 to 50%, global hypokinesis, normal RV systolic function, mild mitral valve regurgitation, mild aortic valve regurgitation, aortic sclerosis without evidence of aortic stenosis.  He had a 2-second pause on IV diltiazem .  He was transitioned to p.o. metoprolol .  He was discharged home to assisted living in stable condition on 02/15/2024.  Outpatient follow-up with A-fib clinic was arranged, however, unfortunately he missed this appointment.  He presents today for follow-up accompanied by his son.  Since his hospitalization he has been stable from a cardiac standpoint.  He notes intermittent chest tightness that occurs when he feels stressed or anxious. He denies any exertional chest pain, he has stable chronic dizziness, managed with meclizine . He denies any palpitations, dyspnea, edema, PND, orthopnea, weight gain. He shares that his wife died on 01-Mar-2024.   Home Medications    Current Outpatient Medications  Medication Sig Dispense Refill   acetaminophen  (TYLENOL ) 325 MG tablet Take 1-2 tablets (325-650 mg total) by mouth every 4 (four) hours as needed for mild pain.     amLODipine  (NORVASC ) 2.5 MG tablet Take 1 tablet (2.5 mg total) by mouth daily. 90 tablet 3   apixaban  (ELIQUIS ) 5 MG TABS tablet Take 1 tablet (5 mg total) by  mouth 2 (two) times daily. 60 tablet 2   atorvastatin  (LIPITOR) 20 MG tablet Take 20 mg by mouth daily.     calcium  carbonate (TUMS - DOSED IN MG ELEMENTAL CALCIUM ) 500 MG chewable tablet Chew 1,000 mg by mouth daily as needed for indigestion or heartburn.     docusate sodium  (COLACE) 100 MG capsule Take 1 capsule (100 mg total) by mouth every 12 (twelve) hours. (Patient taking differently: Take 200 mg by mouth daily.) 60 capsule 0   ipratropium (ATROVENT ) 0.03 % nasal spray Place 1-2 sprays into both nostrils 2 (two) times daily as needed (allergies).     loperamide (IMODIUM A-D) 2 MG tablet Take 2 mg by mouth every 6 (six) hours as needed for diarrhea or loose stools.     loratadine  (CLARITIN ) 10 MG tablet Take 10 mg by mouth daily.     meclizine  (ANTIVERT ) 25 MG tablet Take 25 mg by mouth as needed for dizziness.     metoprolol  succinate (TOPROL -XL) 25 MG 24 hr tablet Take 3 tablets (75 mg total) by mouth daily. 90 tablet 3   Multiple Vitamins-Minerals (PRESERVISION AREDS 2) CAPS Take 1 capsule by mouth 2 (two) times daily.     nitroGLYCERIN  (NITROSTAT ) 0.4 MG SL tablet Place 1 tablet (0.4 mg total) under the tongue every 5 (five) minutes as needed for chest pain (Max 3 doeses). 25 tablet 3   Nutritional Supplements (ENSURE ORIGINAL PO) Take by mouth. 1 bottle 2 times per day.     Oral Electrolytes (PEDIALYTE PO) Take 236 mLs by mouth 3 (three) times daily.     polyethylene glycol (MIRALAX  / GLYCOLAX ) 17 g packet Take 17 g by mouth daily. 14 each 2   predniSONE  (DELTASONE ) 10 MG tablet Take 10 mg by mouth every morning.     QUEtiapine  (SEROQUEL ) 25 MG tablet Take 1 tablet (25 mg total) by mouth at bedtime. 30 tablet 0   tamsulosin  (FLOMAX ) 0.4 MG CAPS capsule Take 1 capsule (0.4 mg total) by mouth daily after supper. 180 capsule 3   No current facility-administered medications for this visit.  Review of Systems    He denies palpitations, dyspnea, pnd, orthopnea, n, v, syncope, edema,  weight gain, or early satiety. All other systems reviewed and are otherwise negative except as noted above.   Physical Exam    VS:  BP 124/70   Pulse 99   Ht 5\' 7"  (1.702 m)   Wt 139 lb (63 kg)   SpO2 97%   BMI 21.77 kg/m  GEN: Well nourished, well developed, in no acute distress. HEENT: normal. Neck: Supple, no JVD, carotid bruits, or masses. Cardiac: IRIR, no murmurs, rubs, or gallops. No clubbing, cyanosis, edema.  Radials/DP/PT 2+ and equal bilaterally.  Respiratory:  Respirations regular and unlabored, clear to auscultation bilaterally. GI: Soft, nontender, nondistended, BS + x 4. MS: no deformity or atrophy. Skin: warm and dry, no rash. Neuro:  Strength and sensation are intact. Psych: Normal affect.  Accessory Clinical Findings    ECG personally reviewed by me today - EKG Interpretation Date/Time:  Thursday Apr 04 2024 13:45:06 EDT Ventricular Rate:  99 PR Interval:    QRS Duration:  88 QT Interval:  298 QTC Calculation: 382 R Axis:   -59  Text Interpretation: Atrial fibrillation Left anterior fascicular block Left ventricular hypertrophy with repolarization abnormality ( R in aVL , Cornell product ) Cannot rule out Septal infarct (cited on or before 10-Feb-2024) When compared with ECG of 10-Feb-2024 13:17, T wave amplitude has increased in Inferior leads T wave inversion more evident in Lateral leads QT has shortened Confirmed by Marlana Silvan ([id number]) on 04/04/2024 1:53:41 PM  - no acute changes.   Lab Results  Component Value Date   WBC 7.2 02/15/2024   HGB 14.0 02/15/2024   HCT 44.3 02/15/2024   MCV 96.9 02/15/2024   PLT 126 (L) 02/15/2024   Lab Results  Component Value Date   CREATININE 1.19 02/15/2024   BUN 16 02/15/2024   NA 139 02/15/2024   K 4.8 02/15/2024   CL 108 02/15/2024   CO2 24 02/15/2024   Lab Results  Component Value Date   ALT 24 05/04/2023   AST 22 05/04/2023   ALKPHOS 117 05/04/2023   BILITOT 0.4 05/04/2023   Lab Results  Component  Value Date   CHOL  05/05/2009    124        ATP III CLASSIFICATION:  <200     mg/dL   Desirable  604-540  mg/dL   Borderline High  >=981    mg/dL   High          HDL 39 (L) 05/05/2009   LDLCALC  05/05/2009    62        Total Cholesterol/HDL:CHD Risk Coronary Heart Disease Risk Table                     Men   Women  1/2 Average Risk   3.4   3.3  Average Risk       5.0   4.4  2 X Average Risk   9.6   7.1  3 X Average Risk  23.4   11.0        Use the calculated Patient Ratio above and the CHD Risk Table to determine the patient's CHD Risk.        ATP III CLASSIFICATION (LDL):  <100     mg/dL   Optimal  191-478  mg/dL   Near or Above  Optimal  130-159  mg/dL   Borderline  366-440  mg/dL   High  >347     mg/dL   Very High   TRIG 425 05/05/2009   CHOLHDL 3.2 05/05/2009    Lab Results  Component Value Date   HGBA1C  05/02/2009    5.8 (NOTE) The ADA recommends the following therapeutic goal for glycemic control related to Hgb A1c measurement: Goal of therapy: <6.5 Hgb A1c  Reference: American Diabetes Association: Clinical Practice Recommendations 2010, Diabetes Care, 2010, 33: (Suppl  1).    Assessment & Plan   1. Persistent atrial fibrillation: Diagnosed during hospitalization in 02/2024 in the setting of norovirus.  He remains in atrial fibrillation, rate controlled, he is generally asymptomatic.  We discussed the diagnosis of atrial fibrillation, management strategies including DCCV, antiarrhythmic therapy, ablation.  Patient does not wish to proceed with DCCV without speaking to his family first.  He will notify us  should he wish to proceed.  In the meantime, we will refer him to the A-fib clinic for ongoing management of persistent atrial fibrillation.  Will check CBC, BMET today.  Advised him to report resting heart rate consistently greater than 110 bpm.    Reviewed ED precautions. Advised him not to miss any doses of his Eliquis . Continue metoprolol ,  Eliquis .   2. Cardiomyopathy: Prior EF 45 to 54%.  Echocardiogram during hospitalization in 02/2024 in the setting of atrial fibrillation with RVR showed EF 45 to 50%, global hypokinesis, normal RV systolic function, mild mitral valve regurgitation, mild aortic valve regurgitation, aortic sclerosis without evidence of aortic stenosis.  Euvolemic well compensated on exam.  Continue metoprolol .  3. CAD:  S/p prior stenting-RCA and ramus 1997.  Stress test in 2021 was overall low risk.  He notes intermittent chest tightness that occurs when he feels stressed or anxious.  His wife recently passed away.  He denies any exertional chest pain.  No indication for ischemic evaluation at this time.  Continue to monitor symptoms.  No ASA in the setting of chronic DOAC therapy.  Continue metoprolol , amlodipine , Lipitor.  4. Hypertension: BP well controlled. Continue current antihypertensive regimen.   5. Hyperlipidemia: LDL was 55 in 12/2022.  Continue Lipitor.  6. Disposition: Refer to a fib clinic. Follow-up with general cardiology in 3 months, sooner if needed.      Jude Norton, NP 04/04/2024, 1:59 PM

## 2024-04-04 NOTE — Patient Instructions (Signed)
 Medication Instructions:  Continue Eliquis  5 mg  twice daily  *If you need a refill on your cardiac medications before your next appointment, please call your pharmacy*  Lab Work: CBC, BMT today  Testing/Procedures: NONE ordered at this time of appointment    Follow-Up: At [facility], you and your health needs are our priority.  As part of our continuing mission to provide you with exceptional heart care, our providers are all part of one team.  This team includes your primary Cardiologist (physician) and Advanced Practice Providers or APPs (Physician Assistants and Nurse Practitioners) who all work together to provide you with the care you need, when you need it.  Your next appointment:   3 month(s)  Provider:   Lauro Portal, MD or Marlana Silvan, NP          We recommend signing up for the patient portal called "MyChart".  Sign up information is provided on this After Visit Summary.  MyChart is used to connect with patients for Virtual Visits (Telemedicine).  Patients are able to view lab/test results, encounter notes, upcoming appointments, etc.  Non-urgent messages can be sent to your provider as well.   To learn more about what you can do with MyChart, go to [email or web address].   Other Instructions

## 2024-04-05 LAB — CBC
Hematocrit: 43.5 % (ref 37.5–51.0)
Hemoglobin: 14.2 g/dL (ref 13.0–17.7)
MCH: 31.2 pg (ref 26.6–33.0)
MCHC: 32.6 g/dL (ref 31.5–35.7)
MCV: 96 fL (ref 79–97)
Platelets: 144 10*3/uL — ABNORMAL LOW (ref 150–450)
RBC: 4.55 x10E6/uL (ref 4.14–5.80)
RDW: 13.4 % (ref 11.6–15.4)
WBC: 8.8 10*3/uL (ref 3.4–10.8)

## 2024-04-05 LAB — BASIC METABOLIC PANEL WITH GFR
BUN/Creatinine Ratio: 21 (ref 10–24)
BUN: 35 mg/dL (ref 10–36)
CO2: 23 mmol/L (ref 20–29)
Calcium: 9.3 mg/dL (ref 8.6–10.2)
Chloride: 102 mmol/L (ref 96–106)
Creatinine, Ser: 1.65 mg/dL — ABNORMAL HIGH (ref 0.76–1.27)
Glucose: 172 mg/dL — ABNORMAL HIGH (ref 70–99)
Potassium: 5.4 mmol/L — ABNORMAL HIGH (ref 3.5–5.2)
Sodium: 141 mmol/L (ref 134–144)
eGFR: 38 mL/min/{1.73_m2} — ABNORMAL LOW (ref >59)

## 2024-04-06 ENCOUNTER — Encounter: Payer: Self-pay | Admitting: Nurse Practitioner

## 2024-04-08 ENCOUNTER — Telehealth (HOSPITAL_BASED_OUTPATIENT_CLINIC_OR_DEPARTMENT_OTHER): Payer: Self-pay

## 2024-04-08 DIAGNOSIS — I2510 Atherosclerotic heart disease of native coronary artery without angina pectoris: Secondary | ICD-10-CM

## 2024-04-08 DIAGNOSIS — M62561 Muscle wasting and atrophy, not elsewhere classified, right lower leg: Secondary | ICD-10-CM | POA: Diagnosis not present

## 2024-04-08 DIAGNOSIS — R441 Visual hallucinations: Secondary | ICD-10-CM | POA: Diagnosis not present

## 2024-04-08 DIAGNOSIS — M62551 Muscle wasting and atrophy, not elsewhere classified, right thigh: Secondary | ICD-10-CM | POA: Diagnosis not present

## 2024-04-08 DIAGNOSIS — Z79899 Other long term (current) drug therapy: Secondary | ICD-10-CM

## 2024-04-08 DIAGNOSIS — F331 Major depressive disorder, recurrent, moderate: Secondary | ICD-10-CM | POA: Diagnosis not present

## 2024-04-08 DIAGNOSIS — R2689 Other abnormalities of gait and mobility: Secondary | ICD-10-CM | POA: Diagnosis not present

## 2024-04-08 DIAGNOSIS — F411 Generalized anxiety disorder: Secondary | ICD-10-CM | POA: Diagnosis not present

## 2024-04-08 DIAGNOSIS — G3184 Mild cognitive impairment, so stated: Secondary | ICD-10-CM | POA: Diagnosis not present

## 2024-04-08 DIAGNOSIS — M62562 Muscle wasting and atrophy, not elsewhere classified, left lower leg: Secondary | ICD-10-CM | POA: Diagnosis not present

## 2024-04-08 DIAGNOSIS — M62552 Muscle wasting and atrophy, not elsewhere classified, left thigh: Secondary | ICD-10-CM | POA: Diagnosis not present

## 2024-04-08 NOTE — Telephone Encounter (Signed)
 Spoke with pt. Pt was notified of lab results. Pt will repeat lab work in 3-5 days. Adequate hydration discussed with pt.

## 2024-04-09 DIAGNOSIS — M62562 Muscle wasting and atrophy, not elsewhere classified, left lower leg: Secondary | ICD-10-CM | POA: Diagnosis not present

## 2024-04-09 DIAGNOSIS — M62561 Muscle wasting and atrophy, not elsewhere classified, right lower leg: Secondary | ICD-10-CM | POA: Diagnosis not present

## 2024-04-09 DIAGNOSIS — M62552 Muscle wasting and atrophy, not elsewhere classified, left thigh: Secondary | ICD-10-CM | POA: Diagnosis not present

## 2024-04-09 DIAGNOSIS — M62551 Muscle wasting and atrophy, not elsewhere classified, right thigh: Secondary | ICD-10-CM | POA: Diagnosis not present

## 2024-04-09 DIAGNOSIS — R2689 Other abnormalities of gait and mobility: Secondary | ICD-10-CM | POA: Diagnosis not present

## 2024-04-10 DIAGNOSIS — Z79899 Other long term (current) drug therapy: Secondary | ICD-10-CM | POA: Diagnosis not present

## 2024-04-10 DIAGNOSIS — I2510 Atherosclerotic heart disease of native coronary artery without angina pectoris: Secondary | ICD-10-CM | POA: Diagnosis not present

## 2024-04-11 DIAGNOSIS — M62551 Muscle wasting and atrophy, not elsewhere classified, right thigh: Secondary | ICD-10-CM | POA: Diagnosis not present

## 2024-04-11 DIAGNOSIS — M62562 Muscle wasting and atrophy, not elsewhere classified, left lower leg: Secondary | ICD-10-CM | POA: Diagnosis not present

## 2024-04-11 DIAGNOSIS — M62561 Muscle wasting and atrophy, not elsewhere classified, right lower leg: Secondary | ICD-10-CM | POA: Diagnosis not present

## 2024-04-11 DIAGNOSIS — M62552 Muscle wasting and atrophy, not elsewhere classified, left thigh: Secondary | ICD-10-CM | POA: Diagnosis not present

## 2024-04-11 DIAGNOSIS — R2689 Other abnormalities of gait and mobility: Secondary | ICD-10-CM | POA: Diagnosis not present

## 2024-04-11 LAB — BASIC METABOLIC PANEL WITH GFR
BUN/Creatinine Ratio: 22 (ref 10–24)
BUN: 33 mg/dL (ref 10–36)
CO2: 21 mmol/L (ref 20–29)
Calcium: 9.5 mg/dL (ref 8.6–10.2)
Chloride: 102 mmol/L (ref 96–106)
Creatinine, Ser: 1.47 mg/dL — ABNORMAL HIGH (ref 0.76–1.27)
Glucose: 156 mg/dL — ABNORMAL HIGH (ref 70–99)
Potassium: 5 mmol/L (ref 3.5–5.2)
Sodium: 139 mmol/L (ref 134–144)
eGFR: 44 mL/min/{1.73_m2} — ABNORMAL LOW (ref >59)

## 2024-04-15 DIAGNOSIS — R2689 Other abnormalities of gait and mobility: Secondary | ICD-10-CM | POA: Diagnosis not present

## 2024-04-15 DIAGNOSIS — M62561 Muscle wasting and atrophy, not elsewhere classified, right lower leg: Secondary | ICD-10-CM | POA: Diagnosis not present

## 2024-04-15 DIAGNOSIS — M62552 Muscle wasting and atrophy, not elsewhere classified, left thigh: Secondary | ICD-10-CM | POA: Diagnosis not present

## 2024-04-15 DIAGNOSIS — M62562 Muscle wasting and atrophy, not elsewhere classified, left lower leg: Secondary | ICD-10-CM | POA: Diagnosis not present

## 2024-04-15 DIAGNOSIS — M62551 Muscle wasting and atrophy, not elsewhere classified, right thigh: Secondary | ICD-10-CM | POA: Diagnosis not present

## 2024-04-18 DIAGNOSIS — M62561 Muscle wasting and atrophy, not elsewhere classified, right lower leg: Secondary | ICD-10-CM | POA: Diagnosis not present

## 2024-04-18 DIAGNOSIS — M62551 Muscle wasting and atrophy, not elsewhere classified, right thigh: Secondary | ICD-10-CM | POA: Diagnosis not present

## 2024-04-18 DIAGNOSIS — M62552 Muscle wasting and atrophy, not elsewhere classified, left thigh: Secondary | ICD-10-CM | POA: Diagnosis not present

## 2024-04-18 DIAGNOSIS — R2689 Other abnormalities of gait and mobility: Secondary | ICD-10-CM | POA: Diagnosis not present

## 2024-04-18 DIAGNOSIS — M62562 Muscle wasting and atrophy, not elsewhere classified, left lower leg: Secondary | ICD-10-CM | POA: Diagnosis not present

## 2024-04-23 DIAGNOSIS — M62551 Muscle wasting and atrophy, not elsewhere classified, right thigh: Secondary | ICD-10-CM | POA: Diagnosis not present

## 2024-04-23 DIAGNOSIS — M62562 Muscle wasting and atrophy, not elsewhere classified, left lower leg: Secondary | ICD-10-CM | POA: Diagnosis not present

## 2024-04-23 DIAGNOSIS — M62552 Muscle wasting and atrophy, not elsewhere classified, left thigh: Secondary | ICD-10-CM | POA: Diagnosis not present

## 2024-04-23 DIAGNOSIS — M62561 Muscle wasting and atrophy, not elsewhere classified, right lower leg: Secondary | ICD-10-CM | POA: Diagnosis not present

## 2024-04-23 DIAGNOSIS — R2689 Other abnormalities of gait and mobility: Secondary | ICD-10-CM | POA: Diagnosis not present

## 2024-04-24 ENCOUNTER — Ambulatory Visit (HOSPITAL_COMMUNITY)
Admission: RE | Admit: 2024-04-24 | Discharge: 2024-04-24 | Disposition: A | Discharge status: Home / Self Care | Source: Ambulatory Visit | Attending: Physician Assistant | Admitting: Physician Assistant

## 2024-04-24 ENCOUNTER — Encounter (HOSPITAL_COMMUNITY): Payer: Self-pay | Admitting: Physician Assistant

## 2024-04-24 VITALS — BP 150/90 | HR 95 | Ht 67.0 in | Wt 142.6 lb

## 2024-04-24 DIAGNOSIS — D6859 Other primary thrombophilia: Secondary | ICD-10-CM | POA: Insufficient documentation

## 2024-04-24 DIAGNOSIS — I4819 Other persistent atrial fibrillation: Secondary | ICD-10-CM | POA: Insufficient documentation

## 2024-04-24 NOTE — Progress Notes (Signed)
 Primary Care Physician: No primary care provider on file. Primary Cardiologist: Lauro Portal, MD Electrophysiologist: None  Referring Physician: Marlana Silvan NP   Donald Todd is a 89 y.o. male with a history of CAD, HTN, HLD, renal mass s/p nephrectomy, blindness, atrial fibrillation who presents for follow up in the [facility].  The patient was initially diagnosed with atrial fibrillation 02/09/24 after presenting to the ED with symptoms of nausea, vomiting, and diarrhea in setting of Norovirus. ECG showed afib with RVR. He was started on Eliquis  for stroke prevention. Cardiology was consulted in the setting of new onset atrial fibrillation. Echocardiogram showed EF 45 to 50%, global hypokinesis, mild mitral valve regurgitation, mild aortic valve regurgitation. He is referred to the AF clinic for evaluation.    Patient presents today for follow up for atrial fibrillation. He remains in rate controlled afib. He does report intermittent dizziness and fatigue although it is unclear if these symptoms preceded his afib diagnosis. No bleeding issues on anticoagulation.   Today, he denies symptoms of palpitations, chest pain, shortness of breath, orthopnea, PND, lower extremity edema, presyncope, syncope, snoring, daytime somnolence, bleeding, or neurologic sequela. The patient is tolerating medications without difficulties and is otherwise without complaint today.    Atrial Fibrillation Risk Factors:  he does not have symptoms or diagnosis of sleep apnea. he does not have a history of rheumatic fever.   Atrial Fibrillation Management history:  Previous antiarrhythmic drugs: none Previous cardioversions: none Previous ablations: none Anticoagulation history: Eliquis   ROS- All systems are reviewed and negative except as per the HPI above.  Past Medical History:  Diagnosis Date   Arthritis    CAD (coronary artery disease)    last cath. 07-09-2010   Cancer  [facility])    kidney cancer , prostate cancer    Chronic kidney disease    lt kidnet removed   H/O cardiovascular stress test 08-04-2010   ef 55% NO ISCHEMIA   H/O unilateral nephrectomy    lt. nephrectomy   Heart attack (HCC) 1993   History of kidney stones    Hyperlipemia    Hypertension    renal dopplers 218.13-normal patency   Inguinal hernia 08/01/11   right   Macular degeneration    Prostate cancer (HCC)    Prostate disease    Cancer S/P seed implant   Renal mass    Vertigo     Current Outpatient Medications  Medication Sig Dispense Refill   acetaminophen  (TYLENOL ) 325 MG tablet Take 1-2 tablets (325-650 mg total) by mouth every 4 (four) hours as needed for mild pain.     amLODipine  (NORVASC ) 2.5 MG tablet Take 1 tablet (2.5 mg total) by mouth daily. 90 tablet 3   apixaban  (ELIQUIS ) 5 MG TABS tablet Take 1 tablet (5 mg total) by mouth 2 (two) times daily. 60 tablet 2   atorvastatin  (LIPITOR) 20 MG tablet Take 20 mg by mouth daily.     calcium  carbonate (TUMS - DOSED IN MG ELEMENTAL CALCIUM ) 500 MG chewable tablet Chew 1,000 mg by mouth daily as needed for indigestion or heartburn.     docusate sodium  (COLACE) 100 MG capsule Take 1 capsule (100 mg total) by mouth every 12 (twelve) hours. (Patient taking differently: Take 200 mg by mouth daily.) 60 capsule 0   ipratropium (ATROVENT ) 0.03 % nasal spray Place 1-2 sprays into both nostrils 2 (two) times daily as needed (allergies).     loperamide (IMODIUM A-D) 2 MG tablet  Take 2 mg by mouth every 6 (six) hours as needed for diarrhea or loose stools.     loratadine  (CLARITIN ) 10 MG tablet Take 10 mg by mouth daily.     meclizine  (ANTIVERT ) 25 MG tablet Take 25 mg by mouth as needed for dizziness.     metoprolol  succinate (TOPROL -XL) 25 MG 24 hr tablet Take 3 tablets (75 mg total) by mouth daily. 90 tablet 3   Multiple Vitamins-Minerals (PRESERVISION AREDS 2) CAPS Take 1 capsule by mouth 2 (two) times daily.     nitroGLYCERIN  (NITROSTAT )  0.4 MG SL tablet Place 1 tablet (0.4 mg total) under the tongue every 5 (five) minutes as needed for chest pain (Max 3 doeses). 25 tablet 3   Nutritional Supplements (ENSURE ORIGINAL PO) Take by mouth. 1 bottle 2 times per day.     Oral Electrolytes (PEDIALYTE PO) Take 236 mLs by mouth 3 (three) times daily.     polyethylene glycol (MIRALAX  / GLYCOLAX ) 17 g packet Take 17 g by mouth daily. 14 each 2   predniSONE  (DELTASONE ) 10 MG tablet Take 10 mg by mouth every morning.     QUEtiapine  (SEROQUEL ) 25 MG tablet Take 1 tablet (25 mg total) by mouth at bedtime. 30 tablet 0   tamsulosin  (FLOMAX ) 0.4 MG CAPS capsule Take 1 capsule (0.4 mg total) by mouth daily after supper. 180 capsule 3   No current facility-administered medications for this encounter.    Physical Exam: BP (!) 150/90   Pulse 95   Ht 5\' 7"  (1.702 m)   Wt 64.7 kg   BMI 22.33 kg/m   GEN: Well nourished, well developed in no acute distress CARDIAC: Irregularly irregular rate and rhythm, no murmurs, rubs, gallops RESPIRATORY:  Clear to auscultation without rales, wheezing or rhonchi  ABDOMEN: Soft, non-tender, non-distended EXTREMITIES:  No edema; No deformity   Wt Readings from Last 3 Encounters:  04/24/24 64.7 kg  04/04/24 63 kg  02/09/24 62.7 kg     EKG today demonstrates  Afib, LAFB Vent. rate 95 BPM PR interval * ms QRS duration 90 ms QT/QTcB 306/384 ms   Echo 02/10/24 demonstrated   1. Global hypokinesis with abnormal septal motion. Left ventricular  ejection fraction, by estimation, is 45 to 50%. The left ventricle has  mildly decreased function. The left ventricle demonstrates global  hypokinesis. Left ventricular diastolic parameters are indeterminate.   2. Right ventricular systolic function is normal. The right ventricular  size is normal. Tricuspid regurgitation signal is inadequate for assessing  PA pressure. The estimated right ventricular systolic pressure is 16.7  mmHg.   3. The mitral valve is  abnormal. Mild mitral valve regurgitation. No  evidence of mitral stenosis.   4. The aortic valve is tricuspid. There is moderate calcification of the  aortic valve. There is moderate thickening of the aortic valve. Aortic  valve regurgitation is mild. Aortic valve sclerosis is present, with no  evidence of aortic valve stenosis.   5. The inferior vena cava is normal in size with greater than 50%  respiratory variability, suggesting right atrial pressure of 3 mmHg.   6. Agitated saline contrast bubble study was negative, with no evidence  of any interatrial shunt.    CHA2DS2-VASc Score = 4  The patient's score is based upon: CHF History: 0 HTN History: 1 Diabetes History: 0 Stroke History: 0 Vascular Disease History: 1 Age Score: 2 Gender Score: 0       ASSESSMENT AND PLAN: Persistent Atrial Fibrillation (ICD10:  I48.19) The patient's CHA2DS2-VASc score is 4, indicating a 4.8% annual risk of stroke.   Patient remains in rate controlled afib. We discussed rate vs rhythm control today. Patient agreeable to trial of SR with potential symptoms and reduced EF. Will arrange for DCCV. Recent labs reviewed. If he feels no different in SR or has quick return of afib he may opt for rate control only. He is not a candidate for ablation and is on quetiapine  which interacts with several AADs.  Continue Eliquis  5 mg BID Continue Toprol  75 mg daily  Secondary Hypercoagulable State (ICD10:  D68.69) The patient is at significant risk for stroke/thromboembolism based upon his CHA2DS2-VASc Score of 4.  Continue Apixaban  (Eliquis ). No bleeding issues.   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    Follow up in the AF clinic post DCCV.     Informed Consent   Shared Decision Making/Informed Consent The risks (stroke, cardiac arrhythmias rarely resulting in  the need for a temporary or permanent pacemaker, skin irritation or burns and complications associated with conscious sedation including aspiration, arrhythmia, respiratory failure and death), benefits (restoration of normal sinus rhythm) and alternatives of a direct current cardioversion were explained in detail to Mr. Goshert and he agrees to proceed.        Myrtha Ates PA-C Afib Clinic [facility] [address] [place], [place] [address] [phone]

## 2024-04-24 NOTE — H&P (View-Only) (Signed)
 Primary Care Physician: No primary care provider on file. Primary Cardiologist: Lauro Portal, MD Electrophysiologist: None  Referring Physician: Marlana Silvan NP   Donald Todd is a 89 y.o. male with a history of CAD, HTN, HLD, renal mass s/p nephrectomy, blindness, atrial fibrillation who presents for follow up in the [facility].  The patient was initially diagnosed with atrial fibrillation 02/09/24 after presenting to the ED with symptoms of nausea, vomiting, and diarrhea in setting of Norovirus. ECG showed afib with RVR. He was started on Eliquis  for stroke prevention. Cardiology was consulted in the setting of new onset atrial fibrillation. Echocardiogram showed EF 45 to 50%, global hypokinesis, mild mitral valve regurgitation, mild aortic valve regurgitation. He is referred to the AF clinic for evaluation.    Patient presents today for follow up for atrial fibrillation. He remains in rate controlled afib. He does report intermittent dizziness and fatigue although it is unclear if these symptoms preceded his afib diagnosis. No bleeding issues on anticoagulation.   Today, he denies symptoms of palpitations, chest pain, shortness of breath, orthopnea, PND, lower extremity edema, presyncope, syncope, snoring, daytime somnolence, bleeding, or neurologic sequela. The patient is tolerating medications without difficulties and is otherwise without complaint today.    Atrial Fibrillation Risk Factors:  he does not have symptoms or diagnosis of sleep apnea. he does not have a history of rheumatic fever.   Atrial Fibrillation Management history:  Previous antiarrhythmic drugs: none Previous cardioversions: none Previous ablations: none Anticoagulation history: Eliquis   ROS- All systems are reviewed and negative except as per the HPI above.  Past Medical History:  Diagnosis Date   Arthritis    CAD (coronary artery disease)    last cath. 07-09-2010   Cancer  [facility])    kidney cancer , prostate cancer    Chronic kidney disease    lt kidnet removed   H/O cardiovascular stress test 08-04-2010   ef 55% NO ISCHEMIA   H/O unilateral nephrectomy    lt. nephrectomy   Heart attack (HCC) 1993   History of kidney stones    Hyperlipemia    Hypertension    renal dopplers 218.13-normal patency   Inguinal hernia 08/01/11   right   Macular degeneration    Prostate cancer (HCC)    Prostate disease    Cancer S/P seed implant   Renal mass    Vertigo     Current Outpatient Medications  Medication Sig Dispense Refill   acetaminophen  (TYLENOL ) 325 MG tablet Take 1-2 tablets (325-650 mg total) by mouth every 4 (four) hours as needed for mild pain.     amLODipine  (NORVASC ) 2.5 MG tablet Take 1 tablet (2.5 mg total) by mouth daily. 90 tablet 3   apixaban  (ELIQUIS ) 5 MG TABS tablet Take 1 tablet (5 mg total) by mouth 2 (two) times daily. 60 tablet 2   atorvastatin  (LIPITOR) 20 MG tablet Take 20 mg by mouth daily.     calcium  carbonate (TUMS - DOSED IN MG ELEMENTAL CALCIUM ) 500 MG chewable tablet Chew 1,000 mg by mouth daily as needed for indigestion or heartburn.     docusate sodium  (COLACE) 100 MG capsule Take 1 capsule (100 mg total) by mouth every 12 (twelve) hours. (Patient taking differently: Take 200 mg by mouth daily.) 60 capsule 0   ipratropium (ATROVENT ) 0.03 % nasal spray Place 1-2 sprays into both nostrils 2 (two) times daily as needed (allergies).     loperamide (IMODIUM A-D) 2 MG tablet  Take 2 mg by mouth every 6 (six) hours as needed for diarrhea or loose stools.     loratadine  (CLARITIN ) 10 MG tablet Take 10 mg by mouth daily.     meclizine  (ANTIVERT ) 25 MG tablet Take 25 mg by mouth as needed for dizziness.     metoprolol  succinate (TOPROL -XL) 25 MG 24 hr tablet Take 3 tablets (75 mg total) by mouth daily. 90 tablet 3   Multiple Vitamins-Minerals (PRESERVISION AREDS 2) CAPS Take 1 capsule by mouth 2 (two) times daily.     nitroGLYCERIN  (NITROSTAT )  0.4 MG SL tablet Place 1 tablet (0.4 mg total) under the tongue every 5 (five) minutes as needed for chest pain (Max 3 doeses). 25 tablet 3   Nutritional Supplements (ENSURE ORIGINAL PO) Take by mouth. 1 bottle 2 times per day.     Oral Electrolytes (PEDIALYTE PO) Take 236 mLs by mouth 3 (three) times daily.     polyethylene glycol (MIRALAX  / GLYCOLAX ) 17 g packet Take 17 g by mouth daily. 14 each 2   predniSONE  (DELTASONE ) 10 MG tablet Take 10 mg by mouth every morning.     QUEtiapine  (SEROQUEL ) 25 MG tablet Take 1 tablet (25 mg total) by mouth at bedtime. 30 tablet 0   tamsulosin  (FLOMAX ) 0.4 MG CAPS capsule Take 1 capsule (0.4 mg total) by mouth daily after supper. 180 capsule 3   No current facility-administered medications for this encounter.    Physical Exam: BP (!) 150/90   Pulse 95   Ht 5\' 7"  (1.702 m)   Wt 64.7 kg   BMI 22.33 kg/m   GEN: Well nourished, well developed in no acute distress CARDIAC: Irregularly irregular rate and rhythm, no murmurs, rubs, gallops RESPIRATORY:  Clear to auscultation without rales, wheezing or rhonchi  ABDOMEN: Soft, non-tender, non-distended EXTREMITIES:  No edema; No deformity   Wt Readings from Last 3 Encounters:  04/24/24 64.7 kg  04/04/24 63 kg  02/09/24 62.7 kg     EKG today demonstrates  Afib, LAFB Vent. rate 95 BPM PR interval * ms QRS duration 90 ms QT/QTcB 306/384 ms   Echo 02/10/24 demonstrated   1. Global hypokinesis with abnormal septal motion. Left ventricular  ejection fraction, by estimation, is 45 to 50%. The left ventricle has  mildly decreased function. The left ventricle demonstrates global  hypokinesis. Left ventricular diastolic parameters are indeterminate.   2. Right ventricular systolic function is normal. The right ventricular  size is normal. Tricuspid regurgitation signal is inadequate for assessing  PA pressure. The estimated right ventricular systolic pressure is 16.7  mmHg.   3. The mitral valve is  abnormal. Mild mitral valve regurgitation. No  evidence of mitral stenosis.   4. The aortic valve is tricuspid. There is moderate calcification of the  aortic valve. There is moderate thickening of the aortic valve. Aortic  valve regurgitation is mild. Aortic valve sclerosis is present, with no  evidence of aortic valve stenosis.   5. The inferior vena cava is normal in size with greater than 50%  respiratory variability, suggesting right atrial pressure of 3 mmHg.   6. Agitated saline contrast bubble study was negative, with no evidence  of any interatrial shunt.    CHA2DS2-VASc Score = 4  The patient's score is based upon: CHF History: 0 HTN History: 1 Diabetes History: 0 Stroke History: 0 Vascular Disease History: 1 Age Score: 2 Gender Score: 0       ASSESSMENT AND PLAN: Persistent Atrial Fibrillation (ICD10:  I48.19) The patient's CHA2DS2-VASc score is 4, indicating a 4.8% annual risk of stroke.   Patient remains in rate controlled afib. We discussed rate vs rhythm control today. Patient agreeable to trial of SR with potential symptoms and reduced EF. Will arrange for DCCV. Recent labs reviewed. If he feels no different in SR or has quick return of afib he may opt for rate control only. He is not a candidate for ablation and is on quetiapine  which interacts with several AADs.  Continue Eliquis  5 mg BID Continue Toprol  75 mg daily  Secondary Hypercoagulable State (ICD10:  D68.69) The patient is at significant risk for stroke/thromboembolism based upon his CHA2DS2-VASc Score of 4.  Continue Apixaban  (Eliquis ). No bleeding issues.   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    Follow up in the AF clinic post DCCV.     Informed Consent   Shared Decision Making/Informed Consent The risks (stroke, cardiac arrhythmias rarely resulting in  the need for a temporary or permanent pacemaker, skin irritation or burns and complications associated with conscious sedation including aspiration, arrhythmia, respiratory failure and death), benefits (restoration of normal sinus rhythm) and alternatives of a direct current cardioversion were explained in detail to Donald Todd and he agrees to proceed.        Myrtha Ates PA-C Afib Clinic [facility] [address] [place], [place] [address] [phone]

## 2024-04-24 NOTE — Patient Instructions (Signed)
 Cardioversion scheduled for: Wednesday, May 28th   - Arrive at the [employer] [facility]A" of [facility] ([address]  and check in with ADMITTING at 9:00AM   - Do not eat or drink anything after midnight the night prior to your procedure.   - Take all your morning medication (except diabetic medications) with a sip of water prior to arrival.  - Do NOT miss any doses of your blood thinner - if you should miss a dose or take a dose more than 4 hours late -- please notify our office immediately.  - You will not be able to drive home after your procedure. Please ensure you have a responsible adult to drive you home. You will need someone with you for 24 hours post procedure.     - Expect to be in the procedural area approximately 2 hours.   - If you feel as if you go back into normal rhythm prior to scheduled cardioversion, please notify our office immediately.   If your procedure is canceled in the cardioversion suite you will be charged a cancellation fee.

## 2024-04-25 DIAGNOSIS — M62562 Muscle wasting and atrophy, not elsewhere classified, left lower leg: Secondary | ICD-10-CM | POA: Diagnosis not present

## 2024-04-25 DIAGNOSIS — R2689 Other abnormalities of gait and mobility: Secondary | ICD-10-CM | POA: Diagnosis not present

## 2024-04-25 DIAGNOSIS — M62551 Muscle wasting and atrophy, not elsewhere classified, right thigh: Secondary | ICD-10-CM | POA: Diagnosis not present

## 2024-04-25 DIAGNOSIS — M62561 Muscle wasting and atrophy, not elsewhere classified, right lower leg: Secondary | ICD-10-CM | POA: Diagnosis not present

## 2024-04-25 DIAGNOSIS — M62552 Muscle wasting and atrophy, not elsewhere classified, left thigh: Secondary | ICD-10-CM | POA: Diagnosis not present

## 2024-04-29 DIAGNOSIS — M62551 Muscle wasting and atrophy, not elsewhere classified, right thigh: Secondary | ICD-10-CM | POA: Diagnosis not present

## 2024-04-29 DIAGNOSIS — M62552 Muscle wasting and atrophy, not elsewhere classified, left thigh: Secondary | ICD-10-CM | POA: Diagnosis not present

## 2024-04-29 DIAGNOSIS — M62562 Muscle wasting and atrophy, not elsewhere classified, left lower leg: Secondary | ICD-10-CM | POA: Diagnosis not present

## 2024-04-29 DIAGNOSIS — R2689 Other abnormalities of gait and mobility: Secondary | ICD-10-CM | POA: Diagnosis not present

## 2024-04-29 DIAGNOSIS — M62561 Muscle wasting and atrophy, not elsewhere classified, right lower leg: Secondary | ICD-10-CM | POA: Diagnosis not present

## 2024-04-30 NOTE — Progress Notes (Signed)
 Called patient with pre-procedure instructions for tomorrow.   Patient informed of:   Time to arrive for procedure.0945 Remain NPO past midnight.  Must have a ride home and a responsible adult to remain with them for 24 hours post procedure.  Confirmed blood thinner. Eliquis  Confirmed no breaks in taking blood thinner for 3+ weeks prior to procedure. Confirmed patient stopped all GLP-1s and GLP-2s for at least one week before procedure.

## 2024-05-01 ENCOUNTER — Ambulatory Visit (HOSPITAL_BASED_OUTPATIENT_CLINIC_OR_DEPARTMENT_OTHER): Payer: Self-pay

## 2024-05-01 ENCOUNTER — Encounter (HOSPITAL_COMMUNITY)
Admission: RE | Disposition: A | Discharge status: Home / Self Care | Payer: Self-pay | Source: Home / Self Care | Attending: Cardiology

## 2024-05-01 ENCOUNTER — Ambulatory Visit (HOSPITAL_COMMUNITY): Payer: Self-pay

## 2024-05-01 ENCOUNTER — Encounter (HOSPITAL_COMMUNITY): Payer: Self-pay | Admitting: Cardiology

## 2024-05-01 ENCOUNTER — Ambulatory Visit (HOSPITAL_COMMUNITY)
Admission: RE | Admit: 2024-05-01 | Discharge: 2024-05-01 | Disposition: A | Discharge status: Home / Self Care | Attending: Cardiology | Admitting: Cardiology

## 2024-05-01 ENCOUNTER — Other Ambulatory Visit: Payer: Self-pay

## 2024-05-01 DIAGNOSIS — E785 Hyperlipidemia, unspecified: Secondary | ICD-10-CM | POA: Diagnosis not present

## 2024-05-01 DIAGNOSIS — I130 Hypertensive heart and chronic kidney disease with heart failure and stage 1 through stage 4 chronic kidney disease, or unspecified chronic kidney disease: Secondary | ICD-10-CM | POA: Insufficient documentation

## 2024-05-01 DIAGNOSIS — Z905 Acquired absence of kidney: Secondary | ICD-10-CM | POA: Diagnosis not present

## 2024-05-01 DIAGNOSIS — I4819 Other persistent atrial fibrillation: Secondary | ICD-10-CM | POA: Diagnosis not present

## 2024-05-01 DIAGNOSIS — D6869 Other thrombophilia: Secondary | ICD-10-CM | POA: Diagnosis not present

## 2024-05-01 DIAGNOSIS — Z7901 Long term (current) use of anticoagulants: Secondary | ICD-10-CM | POA: Insufficient documentation

## 2024-05-01 DIAGNOSIS — I129 Hypertensive chronic kidney disease with stage 1 through stage 4 chronic kidney disease, or unspecified chronic kidney disease: Secondary | ICD-10-CM | POA: Diagnosis not present

## 2024-05-01 DIAGNOSIS — I2510 Atherosclerotic heart disease of native coronary artery without angina pectoris: Secondary | ICD-10-CM | POA: Insufficient documentation

## 2024-05-01 DIAGNOSIS — H547 Unspecified visual loss: Secondary | ICD-10-CM | POA: Diagnosis not present

## 2024-05-01 DIAGNOSIS — I252 Old myocardial infarction: Secondary | ICD-10-CM | POA: Insufficient documentation

## 2024-05-01 DIAGNOSIS — I484 Atypical atrial flutter: Secondary | ICD-10-CM | POA: Diagnosis not present

## 2024-05-01 DIAGNOSIS — I5022 Chronic systolic (congestive) heart failure: Secondary | ICD-10-CM | POA: Diagnosis not present

## 2024-05-01 DIAGNOSIS — Z79899 Other long term (current) drug therapy: Secondary | ICD-10-CM | POA: Insufficient documentation

## 2024-05-01 DIAGNOSIS — N189 Chronic kidney disease, unspecified: Secondary | ICD-10-CM | POA: Diagnosis not present

## 2024-05-01 SURGERY — CARDIOVERSION (CATH LAB)
Anesthesia: General

## 2024-05-01 MED ORDER — LIDOCAINE 2% (20 MG/ML) 5 ML SYRINGE
INTRAMUSCULAR | Status: DC | PRN
  Administered 2024-05-01: 20 mg via INTRAVENOUS

## 2024-05-01 MED ORDER — PROPOFOL 10 MG/ML IV BOLUS
INTRAVENOUS | Status: DC | PRN
Start: 2024-05-01 — End: 2024-05-01
  Administered 2024-05-01: 20 mg via INTRAVENOUS
  Administered 2024-05-01: 30 mg via INTRAVENOUS

## 2024-05-01 SURGICAL SUPPLY — 1 items: PAD DEFIB RADIO PHYSIO CONN (PAD) ×2 IMPLANT

## 2024-05-01 NOTE — Discharge Instructions (Signed)

## 2024-05-01 NOTE — CV Procedure (Signed)
 Procedure:   DCCV  Indication:  Symptomatic atrial fibrillation/atypical atrial flutter  Procedure Note:  The patient signed informed consent.  They have had had therapeutic anticoagulation with apixaban  greater than 3 weeks.  Anesthesia was supervised by Dr. Brain Cahill.  Patient received 20 mg IV lidocaine  and 50 mg IV propofol .Adequate airway was maintained throughout and vital followed per protocol.  They were cardioverted x 2 with 200J, 300JJ of biphasic synchronized energy.  After the first shock, he had a brief sinus pause and a few beats of junctional rhythm, then returned to atrial fib. After the second shock, he had a brief sinus pause and then sinus bradycardia.  There were no apparent complications.  The patient had normal neuro status and respiratory status post procedure with vitals stable as recorded elsewhere.    Follow up:  They will continue on current medical therapy and follow up with cardiology as scheduled.  Sheryle Donning, MD PhD 05/01/2024 9:30 AM

## 2024-05-01 NOTE — Transfer of Care (Signed)
 Immediate Anesthesia Transfer of Care Note  Patient: Donald Todd  Procedure(s) Performed: CARDIOVERSION  Patient Location: Cath Lab  Anesthesia Type:MAC  Level of Consciousness: drowsy  Airway & Oxygen Therapy: Patient Spontanous Breathing and Patient connected to nasal cannula oxygen  Post-op Assessment: Report given to RN and Post -op Vital signs reviewed and stable  Post vital signs: Reviewed and stable  Last Vitals:  Vitals Value Taken Time  BP    Temp    Pulse    Resp    SpO2      Last Pain:  Vitals:   05/01/24 0841  TempSrc: Temporal         Complications: No notable events documented.

## 2024-05-01 NOTE — Interval H&P Note (Signed)
 History and Physical Interval Note:  05/01/2024 9:18 AM  Donald Todd  has presented today for surgery, with the diagnosis of afib.  The various methods of treatment have been discussed with the patient and family. After consideration of risks, benefits and other options for treatment, the patient has consented to  Procedure(s): CARDIOVERSION (N/A) as a surgical intervention.  The patient's history has been reviewed, patient examined, no change in status, stable for surgery.  I have reviewed the patient's chart and labs.  Questions were answered to the patient's satisfaction.    Confirmed that he is in atypical flutter by 12 lead ECG prior to cardioversion.  Donald Todd [name]

## 2024-05-03 DIAGNOSIS — C649 Malignant neoplasm of unspecified kidney, except renal pelvis: Secondary | ICD-10-CM | POA: Diagnosis not present

## 2024-05-03 DIAGNOSIS — I252 Old myocardial infarction: Secondary | ICD-10-CM | POA: Diagnosis not present

## 2024-05-06 DIAGNOSIS — M62551 Muscle wasting and atrophy, not elsewhere classified, right thigh: Secondary | ICD-10-CM | POA: Diagnosis not present

## 2024-05-06 DIAGNOSIS — R441 Visual hallucinations: Secondary | ICD-10-CM | POA: Diagnosis not present

## 2024-05-06 DIAGNOSIS — F331 Major depressive disorder, recurrent, moderate: Secondary | ICD-10-CM | POA: Diagnosis not present

## 2024-05-06 DIAGNOSIS — G3184 Mild cognitive impairment, so stated: Secondary | ICD-10-CM | POA: Diagnosis not present

## 2024-05-06 DIAGNOSIS — G8929 Other chronic pain: Secondary | ICD-10-CM | POA: Diagnosis not present

## 2024-05-06 DIAGNOSIS — F411 Generalized anxiety disorder: Secondary | ICD-10-CM | POA: Diagnosis not present

## 2024-05-06 DIAGNOSIS — R2689 Other abnormalities of gait and mobility: Secondary | ICD-10-CM | POA: Diagnosis not present

## 2024-05-06 DIAGNOSIS — M62562 Muscle wasting and atrophy, not elsewhere classified, left lower leg: Secondary | ICD-10-CM | POA: Diagnosis not present

## 2024-05-06 DIAGNOSIS — M62561 Muscle wasting and atrophy, not elsewhere classified, right lower leg: Secondary | ICD-10-CM | POA: Diagnosis not present

## 2024-05-06 DIAGNOSIS — M62552 Muscle wasting and atrophy, not elsewhere classified, left thigh: Secondary | ICD-10-CM | POA: Diagnosis not present

## 2024-05-06 NOTE — Anesthesia Postprocedure Evaluation (Signed)
 Anesthesia Post Note  Patient: Donald Todd  Procedure(s) Performed: CARDIOVERSION     Patient location during evaluation: Cath Lab Anesthesia Type: General Level of consciousness: awake and alert Pain management: pain level controlled Vital Signs Assessment: post-procedure vital signs reviewed and stable Respiratory status: spontaneous breathing, nonlabored ventilation and respiratory function stable Cardiovascular status: stable Postop Assessment: no apparent nausea or vomiting Anesthetic complications: no   No notable events documented.                Maryum Batterson

## 2024-05-06 NOTE — Anesthesia Preprocedure Evaluation (Signed)
 Anesthesia Evaluation  Patient identified by MRN, date of birth, ID band Patient awake    Reviewed: Allergy & Precautions, NPO status , Patient's Chart, lab work & pertinent test results, reviewed documented beta blocker date and time   History of Anesthesia Complications Negative for: history of anesthetic complications  Airway Mallampati: III  TM Distance: >3 FB Neck ROM: Full    Dental  (+) Dental Advisory Given   Pulmonary neg shortness of breath, neg sleep apnea, neg COPD, neg recent URI, former smoker   breath sounds clear to auscultation       Cardiovascular hypertension, Pt. on medications and Pt. on home beta blockers + CAD and + Past MI  + dysrhythmias  Rhythm:Irregular  1. Global hypokinesis with abnormal septal motion. Left ventricular  ejection fraction, by estimation, is 45 to 50%. The left ventricle has  mildly decreased function. The left ventricle demonstrates global  hypokinesis. Left ventricular diastolic  parameters are indeterminate.   2. Right ventricular systolic function is normal. The right ventricular  size is normal. Tricuspid regurgitation signal is inadequate for assessing  PA pressure. The estimated right ventricular systolic pressure is 16.7  mmHg.   3. The mitral valve is abnormal. Mild mitral valve regurgitation. No  evidence of mitral stenosis.   4. The aortic valve is tricuspid. There is moderate calcification of the  aortic valve. There is moderate thickening of the aortic valve. Aortic  valve regurgitation is mild. Aortic valve sclerosis is present, with no  evidence of aortic valve stenosis.   5. The inferior vena cava is normal in size with greater than 50%  respiratory variability, suggesting right atrial pressure of 3 mmHg.   6. Agitated saline contrast bubble study was negative, with no evidence  of any interatrial shunt.      Neuro/Psych negative neurological ROS  negative psych  ROS   GI/Hepatic negative GI ROS, Neg liver ROS,,,  Endo/Other  negative endocrine ROS    Renal/GU CRFRenal disease     Musculoskeletal  (+) Arthritis ,    Abdominal   Peds  Hematology  (+) Blood dyscrasia   Anesthesia Other Findings   Reproductive/Obstetrics                              Anesthesia Physical Anesthesia Plan  ASA: 3  Anesthesia Plan: General   Post-op Pain Management: Minimal or no pain anticipated   Induction: Intravenous  PONV Risk Score and Plan: 2 and Treatment may vary due to age or medical condition  Airway Management Planned: Nasal Cannula, Natural Airway and Simple Face Mask  Additional Equipment: None  Intra-op Plan:   Post-operative Plan:   Informed Consent: I have reviewed the patients History and Physical, chart, labs and discussed the procedure including the risks, benefits and alternatives for the proposed anesthesia with the patient or authorized representative who has indicated his/her understanding and acceptance.     Dental advisory given  Plan Discussed with: CRNA  Anesthesia Plan Comments:          Anesthesia Quick Evaluation

## 2024-05-07 DIAGNOSIS — M62552 Muscle wasting and atrophy, not elsewhere classified, left thigh: Secondary | ICD-10-CM | POA: Diagnosis not present

## 2024-05-07 DIAGNOSIS — M62561 Muscle wasting and atrophy, not elsewhere classified, right lower leg: Secondary | ICD-10-CM | POA: Diagnosis not present

## 2024-05-07 DIAGNOSIS — M62551 Muscle wasting and atrophy, not elsewhere classified, right thigh: Secondary | ICD-10-CM | POA: Diagnosis not present

## 2024-05-07 DIAGNOSIS — R2689 Other abnormalities of gait and mobility: Secondary | ICD-10-CM | POA: Diagnosis not present

## 2024-05-07 DIAGNOSIS — M62562 Muscle wasting and atrophy, not elsewhere classified, left lower leg: Secondary | ICD-10-CM | POA: Diagnosis not present

## 2024-05-09 DIAGNOSIS — M62552 Muscle wasting and atrophy, not elsewhere classified, left thigh: Secondary | ICD-10-CM | POA: Diagnosis not present

## 2024-05-09 DIAGNOSIS — M62561 Muscle wasting and atrophy, not elsewhere classified, right lower leg: Secondary | ICD-10-CM | POA: Diagnosis not present

## 2024-05-09 DIAGNOSIS — M62562 Muscle wasting and atrophy, not elsewhere classified, left lower leg: Secondary | ICD-10-CM | POA: Diagnosis not present

## 2024-05-09 DIAGNOSIS — M62551 Muscle wasting and atrophy, not elsewhere classified, right thigh: Secondary | ICD-10-CM | POA: Diagnosis not present

## 2024-05-09 DIAGNOSIS — R2689 Other abnormalities of gait and mobility: Secondary | ICD-10-CM | POA: Diagnosis not present

## 2024-05-14 DIAGNOSIS — M62561 Muscle wasting and atrophy, not elsewhere classified, right lower leg: Secondary | ICD-10-CM | POA: Diagnosis not present

## 2024-05-14 DIAGNOSIS — R2689 Other abnormalities of gait and mobility: Secondary | ICD-10-CM | POA: Diagnosis not present

## 2024-05-14 DIAGNOSIS — M62551 Muscle wasting and atrophy, not elsewhere classified, right thigh: Secondary | ICD-10-CM | POA: Diagnosis not present

## 2024-05-14 DIAGNOSIS — M62562 Muscle wasting and atrophy, not elsewhere classified, left lower leg: Secondary | ICD-10-CM | POA: Diagnosis not present

## 2024-05-14 DIAGNOSIS — M62552 Muscle wasting and atrophy, not elsewhere classified, left thigh: Secondary | ICD-10-CM | POA: Diagnosis not present

## 2024-05-15 DIAGNOSIS — I252 Old myocardial infarction: Secondary | ICD-10-CM | POA: Diagnosis not present

## 2024-05-15 DIAGNOSIS — M62562 Muscle wasting and atrophy, not elsewhere classified, left lower leg: Secondary | ICD-10-CM | POA: Diagnosis not present

## 2024-05-15 DIAGNOSIS — M62552 Muscle wasting and atrophy, not elsewhere classified, left thigh: Secondary | ICD-10-CM | POA: Diagnosis not present

## 2024-05-15 DIAGNOSIS — R2689 Other abnormalities of gait and mobility: Secondary | ICD-10-CM | POA: Diagnosis not present

## 2024-05-15 DIAGNOSIS — H353230 Exudative age-related macular degeneration, bilateral, stage unspecified: Secondary | ICD-10-CM | POA: Diagnosis not present

## 2024-05-15 DIAGNOSIS — I4891 Unspecified atrial fibrillation: Secondary | ICD-10-CM | POA: Diagnosis not present

## 2024-05-15 DIAGNOSIS — N1831 Chronic kidney disease, stage 3a: Secondary | ICD-10-CM | POA: Diagnosis not present

## 2024-05-15 DIAGNOSIS — M62551 Muscle wasting and atrophy, not elsewhere classified, right thigh: Secondary | ICD-10-CM | POA: Diagnosis not present

## 2024-05-15 DIAGNOSIS — R441 Visual hallucinations: Secondary | ICD-10-CM | POA: Diagnosis not present

## 2024-05-15 DIAGNOSIS — R42 Dizziness and giddiness: Secondary | ICD-10-CM | POA: Diagnosis not present

## 2024-05-15 DIAGNOSIS — C61 Malignant neoplasm of prostate: Secondary | ICD-10-CM | POA: Diagnosis not present

## 2024-05-15 DIAGNOSIS — J309 Allergic rhinitis, unspecified: Secondary | ICD-10-CM | POA: Diagnosis not present

## 2024-05-15 DIAGNOSIS — M62561 Muscle wasting and atrophy, not elsewhere classified, right lower leg: Secondary | ICD-10-CM | POA: Diagnosis not present

## 2024-05-15 DIAGNOSIS — I25119 Atherosclerotic heart disease of native coronary artery with unspecified angina pectoris: Secondary | ICD-10-CM | POA: Diagnosis not present

## 2024-05-15 DIAGNOSIS — K5900 Constipation, unspecified: Secondary | ICD-10-CM | POA: Diagnosis not present

## 2024-05-15 DIAGNOSIS — I1310 Hypertensive heart and chronic kidney disease without heart failure, with stage 1 through stage 4 chronic kidney disease, or unspecified chronic kidney disease: Secondary | ICD-10-CM | POA: Diagnosis not present

## 2024-05-15 DIAGNOSIS — H548 Legal blindness, as defined in USA: Secondary | ICD-10-CM | POA: Diagnosis not present

## 2024-05-17 DIAGNOSIS — R42 Dizziness and giddiness: Secondary | ICD-10-CM | POA: Diagnosis not present

## 2024-05-17 DIAGNOSIS — M79675 Pain in left toe(s): Secondary | ICD-10-CM | POA: Diagnosis not present

## 2024-05-17 DIAGNOSIS — M62551 Muscle wasting and atrophy, not elsewhere classified, right thigh: Secondary | ICD-10-CM | POA: Diagnosis not present

## 2024-05-17 DIAGNOSIS — M62830 Muscle spasm of back: Secondary | ICD-10-CM | POA: Diagnosis not present

## 2024-05-17 DIAGNOSIS — M9901 Segmental and somatic dysfunction of cervical region: Secondary | ICD-10-CM | POA: Diagnosis not present

## 2024-05-17 DIAGNOSIS — M62552 Muscle wasting and atrophy, not elsewhere classified, left thigh: Secondary | ICD-10-CM | POA: Diagnosis not present

## 2024-05-17 DIAGNOSIS — M9902 Segmental and somatic dysfunction of thoracic region: Secondary | ICD-10-CM | POA: Diagnosis not present

## 2024-05-17 DIAGNOSIS — M62562 Muscle wasting and atrophy, not elsewhere classified, left lower leg: Secondary | ICD-10-CM | POA: Diagnosis not present

## 2024-05-17 DIAGNOSIS — M62561 Muscle wasting and atrophy, not elsewhere classified, right lower leg: Secondary | ICD-10-CM | POA: Diagnosis not present

## 2024-05-17 DIAGNOSIS — G609 Hereditary and idiopathic neuropathy, unspecified: Secondary | ICD-10-CM | POA: Diagnosis not present

## 2024-05-17 DIAGNOSIS — M2041 Other hammer toe(s) (acquired), right foot: Secondary | ICD-10-CM | POA: Diagnosis not present

## 2024-05-17 DIAGNOSIS — L84 Corns and callosities: Secondary | ICD-10-CM | POA: Diagnosis not present

## 2024-05-17 DIAGNOSIS — M5411 Radiculopathy, occipito-atlanto-axial region: Secondary | ICD-10-CM | POA: Diagnosis not present

## 2024-05-17 DIAGNOSIS — R2689 Other abnormalities of gait and mobility: Secondary | ICD-10-CM | POA: Diagnosis not present

## 2024-05-17 DIAGNOSIS — B351 Tinea unguium: Secondary | ICD-10-CM | POA: Diagnosis not present

## 2024-05-20 ENCOUNTER — Ambulatory Visit (HOSPITAL_COMMUNITY): Admitting: Physician Assistant

## 2024-05-20 ENCOUNTER — Ambulatory Visit (HOSPITAL_COMMUNITY)
Admission: RE | Admit: 2024-05-20 | Discharge: 2024-05-20 | Disposition: A | Discharge status: Home / Self Care | Source: Ambulatory Visit | Attending: Physician Assistant | Admitting: Physician Assistant

## 2024-05-20 ENCOUNTER — Encounter (HOSPITAL_COMMUNITY): Payer: Self-pay | Admitting: Physician Assistant

## 2024-05-20 VITALS — BP 146/84 | HR 90 | Ht 67.0 in | Wt 142.2 lb

## 2024-05-20 DIAGNOSIS — I484 Atypical atrial flutter: Secondary | ICD-10-CM | POA: Diagnosis not present

## 2024-05-20 DIAGNOSIS — D6859 Other primary thrombophilia: Secondary | ICD-10-CM

## 2024-05-20 DIAGNOSIS — I4819 Other persistent atrial fibrillation: Secondary | ICD-10-CM | POA: Diagnosis not present

## 2024-05-20 DIAGNOSIS — D6869 Other thrombophilia: Secondary | ICD-10-CM | POA: Insufficient documentation

## 2024-05-20 DIAGNOSIS — I080 Rheumatic disorders of both mitral and aortic valves: Secondary | ICD-10-CM | POA: Insufficient documentation

## 2024-05-20 DIAGNOSIS — E785 Hyperlipidemia, unspecified: Secondary | ICD-10-CM | POA: Diagnosis not present

## 2024-05-20 DIAGNOSIS — Z7901 Long term (current) use of anticoagulants: Secondary | ICD-10-CM | POA: Diagnosis not present

## 2024-05-20 DIAGNOSIS — I5022 Chronic systolic (congestive) heart failure: Secondary | ICD-10-CM | POA: Insufficient documentation

## 2024-05-20 DIAGNOSIS — I2510 Atherosclerotic heart disease of native coronary artery without angina pectoris: Secondary | ICD-10-CM | POA: Insufficient documentation

## 2024-05-20 DIAGNOSIS — G8929 Other chronic pain: Secondary | ICD-10-CM | POA: Diagnosis not present

## 2024-05-20 DIAGNOSIS — I110 Hypertensive heart disease with heart failure: Secondary | ICD-10-CM | POA: Insufficient documentation

## 2024-05-20 DIAGNOSIS — G3184 Mild cognitive impairment, so stated: Secondary | ICD-10-CM | POA: Diagnosis not present

## 2024-05-20 DIAGNOSIS — F411 Generalized anxiety disorder: Secondary | ICD-10-CM | POA: Diagnosis not present

## 2024-05-20 DIAGNOSIS — R441 Visual hallucinations: Secondary | ICD-10-CM | POA: Diagnosis not present

## 2024-05-20 DIAGNOSIS — F331 Major depressive disorder, recurrent, moderate: Secondary | ICD-10-CM | POA: Diagnosis not present

## 2024-05-20 NOTE — Progress Notes (Addendum)
 Primary Care Physician: Patient, No Pcp Per Primary Cardiologist: Lauro Portal, MD Electrophysiologist: None  Referring Physician: Marlana Silvan NP   Donald Todd is a 89 y.o. male with a history of CAD, HTN, HLD, renal mass s/p nephrectomy, blindness, atrial fibrillation who presents for follow up in the [facility].  The patient was initially diagnosed with atrial fibrillation 02/09/24 after presenting to the ED with symptoms of nausea, vomiting, and diarrhea in setting of Norovirus. ECG showed afib with RVR. He was started on Eliquis  for stroke prevention. Cardiology was consulted in the setting of new onset atrial fibrillation. Echocardiogram showed EF 45 to 50%, global hypokinesis, mild mitral valve regurgitation, mild aortic valve regurgitation.    Patient returns for follow up for atrial fibrillation. Patient is s/p DCCV on 05/01/24. He is back in afib today. He states that his intermittent dizziness may have been better but otherwise he felt the same post DCCV. No bleeding issues on anticoagulation.   Today, he  denies symptoms of palpitations, chest pain, shortness of breath, orthopnea, PND, lower extremity edema, presyncope, syncope, snoring, daytime somnolence, bleeding, or neurologic sequela. The patient is tolerating medications without difficulties and is otherwise without complaint today.    Atrial Fibrillation Risk Factors:  he does not have symptoms or diagnosis of sleep apnea. he does not have a history of rheumatic fever.   Atrial Fibrillation Management history:  Previous antiarrhythmic drugs: none Previous cardioversions: 05/01/24 Previous ablations: none Anticoagulation history: Eliquis   ROS- All systems are reviewed and negative except as per the HPI above.  Past Medical History:  Diagnosis Date   Arthritis    CAD (coronary artery disease)    last cath. 07-09-2010   Cancer [facility])    kidney cancer , prostate cancer    Chronic  kidney disease    lt kidnet removed   H/O cardiovascular stress test 08-04-2010   ef 55% NO ISCHEMIA   H/O unilateral nephrectomy    lt. nephrectomy   Heart attack (HCC) 1993   History of kidney stones    Hyperlipemia    Hypertension    renal dopplers 218.13-normal patency   Inguinal hernia 08/01/11   right   Macular degeneration    Prostate cancer (HCC)    Prostate disease    Cancer S/P seed implant   Renal mass    Vertigo     Current Outpatient Medications  Medication Sig Dispense Refill   acetaminophen  (TYLENOL ) 325 MG tablet Take 1-2 tablets (325-650 mg total) by mouth every 4 (four) hours as needed for mild pain.     amLODipine  (NORVASC ) 2.5 MG tablet Take 1 tablet (2.5 mg total) by mouth daily. 90 tablet 3   apixaban  (ELIQUIS ) 5 MG TABS tablet Take 1 tablet (5 mg total) by mouth 2 (two) times daily. 60 tablet 2   atorvastatin  (LIPITOR) 20 MG tablet Take 20 mg by mouth daily.     calcium  carbonate (TUMS - DOSED IN MG ELEMENTAL CALCIUM ) 500 MG chewable tablet Chew 1,000 mg by mouth daily as needed for indigestion or heartburn.     docusate sodium  (COLACE) 100 MG capsule Take 1 capsule (100 mg total) by mouth every 12 (twelve) hours. 60 capsule 0   ipratropium (ATROVENT ) 0.03 % nasal spray Place 1-2 sprays into both nostrils 2 (two) times daily as needed (allergies).     loperamide (IMODIUM A-D) 2 MG tablet Take 2 mg by mouth every 6 (six) hours as needed for diarrhea or  loose stools.     loratadine  (CLARITIN ) 10 MG tablet Take 10 mg by mouth daily.     meclizine  (ANTIVERT ) 25 MG tablet Take 25 mg by mouth as needed for dizziness.     metoprolol  succinate (TOPROL -XL) 25 MG 24 hr tablet Take 3 tablets (75 mg total) by mouth daily. 90 tablet 3   Multiple Vitamins-Minerals (PRESERVISION AREDS 2) CAPS Take 1 capsule by mouth 2 (two) times daily.     nitroGLYCERIN  (NITROSTAT ) 0.4 MG SL tablet Place 1 tablet (0.4 mg total) under the tongue every 5 (five) minutes as needed for chest pain  (Max 3 doeses). 25 tablet 3   Nutritional Supplements (ENSURE ORIGINAL PO) Take by mouth. 1 bottle 2 times per day.     Oral Electrolytes (PEDIALYTE PO) Take 236 mLs by mouth 3 (three) times daily.     polyethylene glycol (MIRALAX  / GLYCOLAX ) 17 g packet Take 17 g by mouth daily. 14 each 2   predniSONE  (DELTASONE ) 10 MG tablet Take 10 mg by mouth every morning.     QUEtiapine  (SEROQUEL ) 25 MG tablet Take 1 tablet (25 mg total) by mouth at bedtime. 30 tablet 0   tamsulosin  (FLOMAX ) 0.4 MG CAPS capsule Take 1 capsule (0.4 mg total) by mouth daily after supper. 180 capsule 3   No current facility-administered medications for this encounter.    Physical Exam: BP (!) 146/84   Pulse 90   Ht 5' 7 (1.702 m)   Wt 64.5 kg   BMI 22.27 kg/m   GEN: Well nourished, well developed in no acute distress CARDIAC: Irregularly irregular rate and rhythm, no murmurs, rubs, gallops RESPIRATORY:  Clear to auscultation without rales, wheezing or rhonchi  ABDOMEN: Soft, non-tender, non-distended EXTREMITIES:  No edema; No deformity    Wt Readings from Last 3 Encounters:  05/20/24 64.5 kg  04/24/24 64.7 kg  04/04/24 63 kg     EKG today demonstrates  Atypical atrial flutter with variable block, LAFB Vent. rate 90 BPM PR interval * ms QRS duration 92 ms QT/QTcB 352/430 ms   Echo 02/10/24 demonstrated   1. Global hypokinesis with abnormal septal motion. Left ventricular  ejection fraction, by estimation, is 45 to 50%. The left ventricle has  mildly decreased function. The left ventricle demonstrates global  hypokinesis. Left ventricular diastolic parameters are indeterminate.   2. Right ventricular systolic function is normal. The right ventricular  size is normal. Tricuspid regurgitation signal is inadequate for assessing  PA pressure. The estimated right ventricular systolic pressure is 16.7  mmHg.   3. The mitral valve is abnormal. Mild mitral valve regurgitation. No  evidence of mitral  stenosis.   4. The aortic valve is tricuspid. There is moderate calcification of the  aortic valve. There is moderate thickening of the aortic valve. Aortic  valve regurgitation is mild. Aortic valve sclerosis is present, with no  evidence of aortic valve stenosis.   5. The inferior vena cava is normal in size with greater than 50%  respiratory variability, suggesting right atrial pressure of 3 mmHg.   6. Agitated saline contrast bubble study was negative, with no evidence  of any interatrial shunt.    CHA2DS2-VASc Score = 4  The patient's score is based upon: CHF History: 0 HTN History: 1 Diabetes History: 0 Stroke History: 0 Vascular Disease History: 1 Age Score: 2 Gender Score: 0       ASSESSMENT AND PLAN: Persistent Atrial Fibrillation (ICD10:  I48.19) The patient's CHA2DS2-VASc score is 4, indicating  a 4.8% annual risk of stroke.   S/p DCCV with quick return of afib. Patient reports he may have had some improvement with intermittent dizziness but otherwise did not feel any different. We discussed rate vs rhythm control. He is not an ablation candidate due to his advanced age. Would avoid class IC with h/0 CAD and CHF. Dofetilide and amiodarone contraindicated with quetiapine  (takes for visual hallucinations). Will pursue a conservative rate control strategy, patient and family in agreement with plan.  Continue Eliquis  5 mg BID Continue Toprol  75 mg daily  Secondary Hypercoagulable State (ICD10:  D68.69) The patient is at significant risk for stroke/thromboembolism based upon his CHA2DS2-VASc Score of 4.  Continue Apixaban  (Eliquis ). No bleeding issues.   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     Myrtha Ates PA-C [facility] [address] [place], [place]  [address] [phone]

## 2024-05-23 DIAGNOSIS — H35373 Puckering of macula, bilateral: Secondary | ICD-10-CM | POA: Diagnosis not present

## 2024-05-23 DIAGNOSIS — H3554 Dystrophies primarily involving the retinal pigment epithelium: Secondary | ICD-10-CM | POA: Diagnosis not present

## 2024-05-23 DIAGNOSIS — H353231 Exudative age-related macular degeneration, bilateral, with active choroidal neovascularization: Secondary | ICD-10-CM | POA: Diagnosis not present

## 2024-05-28 DIAGNOSIS — R2689 Other abnormalities of gait and mobility: Secondary | ICD-10-CM | POA: Diagnosis not present

## 2024-05-28 DIAGNOSIS — M62562 Muscle wasting and atrophy, not elsewhere classified, left lower leg: Secondary | ICD-10-CM | POA: Diagnosis not present

## 2024-05-28 DIAGNOSIS — M62552 Muscle wasting and atrophy, not elsewhere classified, left thigh: Secondary | ICD-10-CM | POA: Diagnosis not present

## 2024-05-28 DIAGNOSIS — M62561 Muscle wasting and atrophy, not elsewhere classified, right lower leg: Secondary | ICD-10-CM | POA: Diagnosis not present

## 2024-05-28 DIAGNOSIS — M62551 Muscle wasting and atrophy, not elsewhere classified, right thigh: Secondary | ICD-10-CM | POA: Diagnosis not present

## 2024-05-30 DIAGNOSIS — M62551 Muscle wasting and atrophy, not elsewhere classified, right thigh: Secondary | ICD-10-CM | POA: Diagnosis not present

## 2024-05-30 DIAGNOSIS — M62562 Muscle wasting and atrophy, not elsewhere classified, left lower leg: Secondary | ICD-10-CM | POA: Diagnosis not present

## 2024-05-30 DIAGNOSIS — R2689 Other abnormalities of gait and mobility: Secondary | ICD-10-CM | POA: Diagnosis not present

## 2024-05-30 DIAGNOSIS — E785 Hyperlipidemia, unspecified: Secondary | ICD-10-CM | POA: Diagnosis not present

## 2024-05-30 DIAGNOSIS — M62552 Muscle wasting and atrophy, not elsewhere classified, left thigh: Secondary | ICD-10-CM | POA: Diagnosis not present

## 2024-05-30 DIAGNOSIS — M62561 Muscle wasting and atrophy, not elsewhere classified, right lower leg: Secondary | ICD-10-CM | POA: Diagnosis not present

## 2024-05-30 DIAGNOSIS — I10 Essential (primary) hypertension: Secondary | ICD-10-CM | POA: Diagnosis not present

## 2024-06-03 DIAGNOSIS — F331 Major depressive disorder, recurrent, moderate: Secondary | ICD-10-CM | POA: Diagnosis not present

## 2024-06-03 DIAGNOSIS — R441 Visual hallucinations: Secondary | ICD-10-CM | POA: Diagnosis not present

## 2024-06-03 DIAGNOSIS — F411 Generalized anxiety disorder: Secondary | ICD-10-CM | POA: Diagnosis not present

## 2024-06-03 DIAGNOSIS — G3184 Mild cognitive impairment, so stated: Secondary | ICD-10-CM | POA: Diagnosis not present

## 2024-06-03 DIAGNOSIS — G8929 Other chronic pain: Secondary | ICD-10-CM | POA: Diagnosis not present

## 2024-06-04 ENCOUNTER — Emergency Department (HOSPITAL_COMMUNITY)
Admission: EM | Admit: 2024-06-04 | Discharge: 2024-06-04 | Disposition: A | Discharge status: Home / Self Care | Attending: Emergency Medicine | Admitting: Emergency Medicine

## 2024-06-04 ENCOUNTER — Encounter (HOSPITAL_COMMUNITY): Payer: Self-pay

## 2024-06-04 ENCOUNTER — Other Ambulatory Visit: Payer: Self-pay

## 2024-06-04 DIAGNOSIS — I10 Essential (primary) hypertension: Secondary | ICD-10-CM | POA: Diagnosis not present

## 2024-06-04 DIAGNOSIS — I4891 Unspecified atrial fibrillation: Secondary | ICD-10-CM | POA: Diagnosis not present

## 2024-06-04 DIAGNOSIS — R079 Chest pain, unspecified: Secondary | ICD-10-CM | POA: Diagnosis not present

## 2024-06-04 DIAGNOSIS — R000 Tachycardia, unspecified: Secondary | ICD-10-CM | POA: Diagnosis not present

## 2024-06-04 DIAGNOSIS — I4811 Longstanding persistent atrial fibrillation: Secondary | ICD-10-CM | POA: Diagnosis not present

## 2024-06-04 LAB — BASIC METABOLIC PANEL WITH GFR
Anion gap: 9 (ref 5–15)
BUN: 25 mg/dL — ABNORMAL HIGH (ref 8–23)
CO2: 27 mmol/L (ref 22–32)
Calcium: 8.8 mg/dL — ABNORMAL LOW (ref 8.9–10.3)
Chloride: 103 mmol/L (ref 98–111)
Creatinine, Ser: 1.68 mg/dL — ABNORMAL HIGH (ref 0.61–1.24)
GFR, Estimated: 38 mL/min — ABNORMAL LOW (ref >60)
Glucose, Bld: 150 mg/dL — ABNORMAL HIGH (ref 70–99)
Potassium: 4.6 mmol/L (ref 3.5–5.1)
Sodium: 139 mmol/L (ref 135–145)

## 2024-06-04 LAB — CBC WITH DIFFERENTIAL/PLATELET
Abs Immature Granulocytes: 0.04 10*3/uL (ref 0.00–0.07)
Basophils Absolute: 0.1 10*3/uL (ref 0.0–0.1)
Basophils Relative: 1 %
Eosinophils Absolute: 0.3 10*3/uL (ref 0.0–0.5)
Eosinophils Relative: 5 %
HCT: 40.9 % (ref 39.0–52.0)
Hemoglobin: 13.2 g/dL (ref 13.0–17.0)
Immature Granulocytes: 1 %
Lymphocytes Relative: 20 %
Lymphs Abs: 1.3 10*3/uL (ref 0.7–4.0)
MCH: 31.4 pg (ref 26.0–34.0)
MCHC: 32.3 g/dL (ref 30.0–36.0)
MCV: 97.1 fL (ref 80.0–100.0)
Monocytes Absolute: 0.5 10*3/uL (ref 0.1–1.0)
Monocytes Relative: 8 %
Neutro Abs: 4.1 10*3/uL (ref 1.7–7.7)
Neutrophils Relative %: 65 %
Platelets: 127 10*3/uL — ABNORMAL LOW (ref 150–400)
RBC: 4.21 MIL/uL — ABNORMAL LOW (ref 4.22–5.81)
RDW: 13.7 % (ref 11.5–15.5)
WBC: 6.2 10*3/uL (ref 4.0–10.5)
nRBC: 0 % (ref 0.0–0.2)

## 2024-06-04 MED ORDER — MAGNESIUM SULFATE IN D5W 1-5 GM/100ML-% IV SOLN
1.0000 g | Freq: Once | INTRAVENOUS | Status: AC
  Administered 2024-06-04: 1 g via INTRAVENOUS
  Filled 2024-06-04: qty 100

## 2024-06-04 MED ORDER — METOPROLOL TARTRATE 5 MG/5ML IV SOLN: 5.0000 mg | Freq: Once | INTRAVENOUS | Status: DC

## 2024-06-04 MED ORDER — METOPROLOL TARTRATE 5 MG/5ML IV SOLN
5.0000 mg | Freq: Once | INTRAVENOUS | Status: AC
  Administered 2024-06-04: 5 mg via INTRAVENOUS
  Filled 2024-06-04: qty 5

## 2024-06-04 MED ORDER — ACETAMINOPHEN 500 MG PO TABS
1000.0000 mg | ORAL_TABLET | Freq: Once | ORAL | Status: AC
  Administered 2024-06-04: 1000 mg via ORAL
  Filled 2024-06-04: qty 2

## 2024-06-04 MED ORDER — LACTATED RINGERS IV BOLUS
1000.0000 mL | Freq: Once | INTRAVENOUS | Status: AC
  Administered 2024-06-04: 1000 mL via INTRAVENOUS

## 2024-06-04 MED ORDER — METOPROLOL SUCCINATE ER 25 MG PO TB24: 100.0000 mg | ORAL_TABLET | Freq: Every day | ORAL | 0 refills | Status: DC

## 2024-06-04 MED ORDER — METOPROLOL TARTRATE 25 MG PO TABS
25.0000 mg | ORAL_TABLET | Freq: Once | ORAL | Status: AC
  Administered 2024-06-04: 25 mg via ORAL
  Filled 2024-06-04: qty 1

## 2024-06-04 NOTE — ED Provider Notes (Signed)
 [place] [facility] DEPARTMENT AT [facility] Provider Note   CSN: [id number] Arrival date & time: 06/04/24  1419     Patient presents with: Chest Pain (Pt arrives to ED via EMS from [facility] assisted living facility with c/o chest pain PTA. Pt currently denies chest pain at this time. Pt has history of AFIB was recently cardioverted 2 weeks ago. )   TYMIR TERRAL is a 89 y.o. male.   This is a sturdy 89 year old gentleman here today for intermittent chest pain over the last 1 week.  Patient has a history of atrial fibrillation, occasional rapid ventricular rate.  Patient's most recent cardiology office note reviewed.  When EMS arrived, they reported that the patient had an elevated heart rate in the 140s.  They report that they tried some vagal maneuvers, the patient's heart rate improved.  Patient has not had any chest pain today.   Chest Pain      Prior to Admission medications   Medication Sig Start Date End Date Taking? Authorizing Provider  acetaminophen  (TYLENOL ) 325 MG tablet Take 1-2 tablets (325-650 mg total) by mouth every 4 (four) hours as needed for mild pain. 02/21/23  Yes Love, Sharlet RAMAN, PA-C  amLODipine  (NORVASC ) 5 MG tablet Take 5 mg by mouth daily.   Yes [provider]  apixaban  (ELIQUIS ) 5 MG TABS tablet Take 1 tablet (5 mg total) by mouth 2 (two) times daily. 02/15/24  Yes Drusilla Sabas RAMAN, MD  atorvastatin  (LIPITOR) 20 MG tablet Take 20 mg by mouth every evening.   Yes [provider]  calcium  carbonate (TUMS - DOSED IN MG ELEMENTAL CALCIUM ) 500 MG chewable tablet Chew 1,000 mg by mouth daily as needed for indigestion or heartburn.   Yes [provider]  docusate sodium  (COLACE) 100 MG capsule Take 1 capsule (100 mg total) by mouth every 12 (twelve) hours. 03/10/21  Yes Raford Lenis, MD  ipratropium (ATROVENT ) 0.03 % nasal spray Place 1-2 sprays into both nostrils 2 (two) times daily as needed (allergies). 05/29/20  Yes  [provider]  loperamide (IMODIUM A-D) 2 MG tablet Take 2 mg by mouth every 6 (six) hours as needed for diarrhea or loose stools.   Yes [provider]  loratadine  (CLARITIN ) 10 MG tablet Take 10 mg by mouth every evening. 09/29/21  Yes [provider]  meclizine  (ANTIVERT ) 25 MG tablet Take 25 mg by mouth every 12 (twelve) hours as needed for dizziness.   Yes [provider]  metoprolol  succinate (TOPROL -XL) 25 MG 24 hr tablet Take 4 tablets (100 mg total) by mouth daily. 06/04/24 07/04/24 Yes Mannie Fairy ONEIDA, DO  Multiple Vitamins-Minerals (PRESERVISION AREDS 2) CAPS Take 1 capsule by mouth 2 (two) times daily.   Yes [provider]  nitroGLYCERIN  (NITROSTAT ) 0.4 MG SL tablet Place 1 tablet (0.4 mg total) under the tongue every 5 (five) minutes as needed for chest pain (Max 3 doeses). 02/15/24  Yes Drusilla Sabas RAMAN, MD  Nutritional Supplements (ENSURE ORIGINAL) LIQD Take 1 Container by mouth 2 (two) times daily between meals.   Yes [provider]  Pedialyte (PEDIALYTE) SOLN Take 236 mLs by mouth 3 (three) times daily as needed (dehydration secondary to nausea).   Yes [provider]  polyethylene glycol (MIRALAX  / GLYCOLAX ) 17 g packet Take 17 g by mouth daily. 03/10/21  Yes Raford Lenis, MD  QUEtiapine  (SEROQUEL ) 25 MG tablet Take 1 tablet (25 mg total) by mouth at bedtime. Patient taking differently:  Take 12.5-25 mg by mouth See admin instructions. Take 1/2 tablet (12.5mg ) by mouth in the morning and take 1 tablet (25mg ) at night. 02/21/23  Yes Love, Sharlet RAMAN, PA-C  tamsulosin  (FLOMAX ) 0.4 MG CAPS capsule Take 1 capsule (0.4 mg total) by mouth daily after supper. 02/15/24  Yes Drusilla Sabas RAMAN, MD    Allergies: Tape, Hydrocodone , Iohexol, and Roxicodone  [oxycodone ]    Review of Systems  Cardiovascular:  Positive for chest pain.    Updated Vital Signs BP (!) 146/116   Pulse (!) 55   Temp 98.4 F (36.9 C) (Oral)   Resp (!) 21   Ht 5'  7 (1.702 m)   Wt 63.5 kg   SpO2 100%   BMI 21.93 kg/m   Physical Exam Vitals and nursing note reviewed.  Constitutional:      Appearance: He is not ill-appearing or toxic-appearing.  HENT:     Head: Normocephalic.   Cardiovascular:     Rate and Rhythm: Tachycardia present. Rhythm irregular.     Heart sounds: Normal heart sounds.  Pulmonary:     Breath sounds: Normal breath sounds.  Chest:     Chest wall: No mass or tenderness.  Abdominal:     Palpations: Abdomen is soft.   Neurological:     Mental Status: He is alert.     (all labs ordered are listed, but only abnormal results are displayed) Labs Reviewed  BASIC METABOLIC PANEL WITH GFR - Abnormal; Notable for the following components:      Result Value   Glucose, Bld 150 (*)    BUN 25 (*)    Creatinine, Ser 1.68 (*)    Calcium  8.8 (*)    GFR, Estimated 38 (*)    All other components within normal limits  CBC WITH DIFFERENTIAL/PLATELET - Abnormal; Notable for the following components:   RBC 4.21 (*)    Platelets 127 (*)    All other components within normal limits    EKG: EKG Interpretation Date/Time:  Tuesday June 04 2024 14:21:06 EDT Ventricular Rate:  99 PR Interval:    QRS Duration:  93 QT Interval:  382 QTC Calculation: 491 R Axis:   -71  Text Interpretation: Atrial flutter with predominant 3:1 AV block Inferior infarct, old Anterior infarct, old Lateral leads are also involved Confirmed by Mannie Pac [phone]) on 06/04/2024 3:53:54 PM  Radiology: No results found.   Procedures   Medications Ordered in the ED  magnesium sulfate IVPB 1 g 100 mL (1 g Intravenous New Bag/Given 06/04/24 1538)  acetaminophen  (TYLENOL ) tablet 1,000 mg (has no administration in time range)  metoprolol  tartrate (LOPRESSOR ) tablet 25 mg (has no administration in time range)  lactated ringers  bolus 1,000 mL (1,000 mLs Intravenous New Bag/Given 06/04/24 1438)  metoprolol  tartrate (LOPRESSOR ) injection 5 mg (5 mg Intravenous  Given 06/04/24 1540)                                    Medical Decision Making 89 year old male here today with occasional chest pain, elevated heart rate.  Plan-reviewed the patient's cardiology note, he was recently cardioverted and unfortunately reverted.  Given his age, he is not deemed a candidate for ablation.  Looks like he was recently increased on his metoprolol .  Here in the ED, patient without any chest pain.  Please likely has been having intermittent runs of an accelerated rate.  Did give him 1 single dose  of IV metoprolol  with sustained rates in the 80s to 100.  This is an appropriate rate for this patient.  Gave him a single p.o. dose of metoprolol .  Will increase his metoprolol  succinate to 100 mg daily.  Will have him follow-up with his PCP.  Considered a more extended workup in this patient, however with his history of atrial fibrillation, intermittent chest pain, there is likely demand ischemia, likely would have a mildly elevated troponin which would not be indicative of ACS.  Amount and/or Complexity of Data Reviewed Labs: ordered.  Risk OTC drugs. Prescription drug management.        Final diagnoses:  Longstanding persistent atrial fibrillation [facility])    ED Discharge Orders          Ordered    metoprolol  succinate (TOPROL -XL) 25 MG 24 hr tablet  Daily        06/04/24 1603               Mannie Pac T, DO 06/04/24 1605

## 2024-06-04 NOTE — ED Notes (Signed)
 Pt and daughter and member at [facility] were given discharge instructions and verbalized understanding. All Vss at time of discharge, IV removed intact, and patient wheelchaired to daughters car.

## 2024-06-04 NOTE — Discharge Instructions (Signed)
 While you were in the emergency room, you had blood work done that was normal.  You received a liter of fluid, and some magnesium.  You need to go up on your metoprolol .  I have increased your dose from 75 mg each day to 100 mg.  You may begin taking this tomorrow.  Please call your cardiologist tomorrow for an appointment.  Return to the emergency room if you have worsening pain in your chest or difficulty with your breathing.

## 2024-06-04 NOTE — ED Triage Notes (Signed)
 Pt arrives to ED via EMS from [facility] assisted living facility with c/o chest pain. Pt has history of AFIB and was recently cardioverted approx 2 weeks ago. Pt HR currently 91 and denies chest pain at this time.

## 2024-06-05 DIAGNOSIS — M62552 Muscle wasting and atrophy, not elsewhere classified, left thigh: Secondary | ICD-10-CM | POA: Diagnosis not present

## 2024-06-05 DIAGNOSIS — M62562 Muscle wasting and atrophy, not elsewhere classified, left lower leg: Secondary | ICD-10-CM | POA: Diagnosis not present

## 2024-06-05 DIAGNOSIS — R2689 Other abnormalities of gait and mobility: Secondary | ICD-10-CM | POA: Diagnosis not present

## 2024-06-05 DIAGNOSIS — M62561 Muscle wasting and atrophy, not elsewhere classified, right lower leg: Secondary | ICD-10-CM | POA: Diagnosis not present

## 2024-06-05 DIAGNOSIS — M62551 Muscle wasting and atrophy, not elsewhere classified, right thigh: Secondary | ICD-10-CM | POA: Diagnosis not present

## 2024-06-06 DIAGNOSIS — M62562 Muscle wasting and atrophy, not elsewhere classified, left lower leg: Secondary | ICD-10-CM | POA: Diagnosis not present

## 2024-06-06 DIAGNOSIS — M62561 Muscle wasting and atrophy, not elsewhere classified, right lower leg: Secondary | ICD-10-CM | POA: Diagnosis not present

## 2024-06-06 DIAGNOSIS — M62552 Muscle wasting and atrophy, not elsewhere classified, left thigh: Secondary | ICD-10-CM | POA: Diagnosis not present

## 2024-06-06 DIAGNOSIS — R2689 Other abnormalities of gait and mobility: Secondary | ICD-10-CM | POA: Diagnosis not present

## 2024-06-06 DIAGNOSIS — M62551 Muscle wasting and atrophy, not elsewhere classified, right thigh: Secondary | ICD-10-CM | POA: Diagnosis not present

## 2024-06-12 ENCOUNTER — Encounter (HOSPITAL_COMMUNITY): Payer: Self-pay | Admitting: Physician Assistant

## 2024-06-12 ENCOUNTER — Ambulatory Visit (HOSPITAL_COMMUNITY): Admitting: Physician Assistant

## 2024-06-12 ENCOUNTER — Ambulatory Visit (HOSPITAL_COMMUNITY)
Admission: RE | Admit: 2024-06-12 | Discharge: 2024-06-12 | Disposition: A | Discharge status: Home / Self Care | Source: Ambulatory Visit | Attending: Physician Assistant | Admitting: Physician Assistant

## 2024-06-12 ENCOUNTER — Inpatient Hospital Stay (HOSPITAL_COMMUNITY)
Admission: RE | Admit: 2024-06-12 | Discharge: 2024-06-12 | Disposition: A | Discharge status: Home / Self Care | Source: Ambulatory Visit | Attending: Physician Assistant | Admitting: Physician Assistant

## 2024-06-12 VITALS — BP 136/100 | HR 112 | Ht 67.0 in | Wt 145.2 lb

## 2024-06-12 DIAGNOSIS — I484 Atypical atrial flutter: Secondary | ICD-10-CM | POA: Diagnosis not present

## 2024-06-12 DIAGNOSIS — M62561 Muscle wasting and atrophy, not elsewhere classified, right lower leg: Secondary | ICD-10-CM | POA: Diagnosis not present

## 2024-06-12 DIAGNOSIS — I10 Essential (primary) hypertension: Secondary | ICD-10-CM | POA: Insufficient documentation

## 2024-06-12 DIAGNOSIS — I4819 Other persistent atrial fibrillation: Secondary | ICD-10-CM

## 2024-06-12 DIAGNOSIS — D6859 Other primary thrombophilia: Secondary | ICD-10-CM | POA: Insufficient documentation

## 2024-06-12 DIAGNOSIS — M62552 Muscle wasting and atrophy, not elsewhere classified, left thigh: Secondary | ICD-10-CM | POA: Diagnosis not present

## 2024-06-12 DIAGNOSIS — M62551 Muscle wasting and atrophy, not elsewhere classified, right thigh: Secondary | ICD-10-CM | POA: Diagnosis not present

## 2024-06-12 DIAGNOSIS — M62562 Muscle wasting and atrophy, not elsewhere classified, left lower leg: Secondary | ICD-10-CM | POA: Diagnosis not present

## 2024-06-12 DIAGNOSIS — R2689 Other abnormalities of gait and mobility: Secondary | ICD-10-CM | POA: Diagnosis not present

## 2024-06-12 MED ORDER — METOPROLOL SUCCINATE ER 50 MG PO TB24: ORAL_TABLET | ORAL | 6 refills | Status: AC

## 2024-06-12 NOTE — Progress Notes (Signed)
 Primary Care Physician: Patient, No Pcp Per Primary Cardiologist: Dorn Lesches, MD Electrophysiologist: None  Referring Physician: Damien Braver NP   Donald Todd is a 89 y.o. male with a history of CAD, HTN, HLD, renal mass s/p nephrectomy, blindness, atrial flutter, atrial fibrillation who presents for follow up in the [facility].  The patient was initially diagnosed with atrial fibrillation 02/09/24 after presenting to the ED with symptoms of nausea, vomiting, and diarrhea in setting of Norovirus. ECG showed afib with RVR. He was started on Eliquis  for stroke prevention. Cardiology was consulted in the setting of new onset atrial fibrillation. Echocardiogram showed EF 45 to 50%, global hypokinesis, mild mitral valve regurgitation, mild aortic valve regurgitation.    Patient is s/p DCCV on 05/01/24 with quick return of afib. Patient and family opted for a rate control strategy. He was seen at the ED 06/04/24 with rapid afib and intermittent chest discomfort. Per EMS, rates were in the 140's. His BB was increased.   Patient returns for follow up for atrial fibrillation and atrial flutter. He states that his chest discomfort is a sharp pain in his epigastric region. He states the pain is mild. He denies any other associated symptoms. No bleeding issues on anticoagulation.   Today, he  denies symptoms of palpitations, shortness of breath, orthopnea, PND, dizziness, presyncope, syncope, snoring, daytime somnolence, bleeding, or neurologic sequela. The patient is tolerating medications without difficulties and is otherwise without complaint today.    Atrial Fibrillation Risk Factors:  he does not have symptoms or diagnosis of sleep apnea. he does not have a history of rheumatic fever.   Atrial Fibrillation Management history:  Previous antiarrhythmic drugs: none Previous cardioversions: 05/01/24 Previous ablations: none Anticoagulation history: Eliquis   ROS-  All systems are reviewed and negative except as per the HPI above.  Past Medical History:  Diagnosis Date   Arthritis    CAD (coronary artery disease)    last cath. 07-09-2010   Cancer [facility]    kidney cancer , prostate cancer    Chronic kidney disease    lt kidnet removed   H/O cardiovascular stress test 08-04-2010   ef 55% NO ISCHEMIA   H/O unilateral nephrectomy    lt. nephrectomy   Heart attack (HCC) 1993   History of kidney stones    Hyperlipemia    Hypertension    renal dopplers 218.13-normal patency   Inguinal hernia 08/01/11   right   Macular degeneration    Prostate cancer (HCC)    Prostate disease    Cancer S/P seed implant   Renal mass    Vertigo     Current Outpatient Medications  Medication Sig Dispense Refill   acetaminophen  (TYLENOL ) 325 MG tablet Take 1-2 tablets (325-650 mg total) by mouth every 4 (four) hours as needed for mild pain.     amLODipine  (NORVASC ) 5 MG tablet Take 5 mg by mouth daily.     apixaban  (ELIQUIS ) 5 MG TABS tablet Take 1 tablet (5 mg total) by mouth 2 (two) times daily. 60 tablet 2   atorvastatin  (LIPITOR) 20 MG tablet Take 20 mg by mouth every evening.     calcium  carbonate (TUMS - DOSED IN MG ELEMENTAL CALCIUM ) 500 MG chewable tablet Chew 1,000 mg by mouth daily as needed for indigestion or heartburn.     docusate sodium  (COLACE) 100 MG capsule Take 1 capsule (100 mg total) by mouth every 12 (twelve) hours. 60 capsule 0   ipratropium (ATROVENT )  0.03 % nasal spray Place 1-2 sprays into both nostrils 2 (two) times daily as needed (allergies).     loperamide (IMODIUM A-D) 2 MG tablet Take 2 mg by mouth every 6 (six) hours as needed for diarrhea or loose stools.     loratadine  (CLARITIN ) 10 MG tablet Take 10 mg by mouth every evening.     meclizine  (ANTIVERT ) 25 MG tablet Take 25 mg by mouth every 12 (twelve) hours as needed for dizziness.     metoprolol  succinate (TOPROL -XL) 25 MG 24 hr tablet Take 4 tablets (100 mg total) by mouth daily. 120  tablet 0   Multiple Vitamins-Minerals (PRESERVISION AREDS 2) CAPS Take 1 capsule by mouth 2 (two) times daily.     nitroGLYCERIN  (NITROSTAT ) 0.4 MG SL tablet Place 1 tablet (0.4 mg total) under the tongue every 5 (five) minutes as needed for chest pain (Max 3 doeses). 25 tablet 3   Nutritional Supplements (ENSURE ORIGINAL) LIQD Take 1 Container by mouth 2 (two) times daily between meals.     Pedialyte (PEDIALYTE) SOLN Take 236 mLs by mouth 3 (three) times daily as needed (dehydration secondary to nausea).     polyethylene glycol (MIRALAX  / GLYCOLAX ) 17 g packet Take 17 g by mouth daily. 14 each 2   QUEtiapine  (SEROQUEL ) 25 MG tablet Take 1 tablet (25 mg total) by mouth at bedtime. 30 tablet 0   tamsulosin  (FLOMAX ) 0.4 MG CAPS capsule Take 1 capsule (0.4 mg total) by mouth daily after supper. 180 capsule 3   No current facility-administered medications for this encounter.    Physical Exam: BP (!) 136/100   Pulse (!) 112   Ht 5' 7 (1.702 m)   Wt 65.9 kg   BMI 22.74 kg/m   GEN: Well nourished, well developed in no acute distress NECK: No JVD CARDIAC: Irregularly irregular rate and rhythm, no murmurs, rubs, gallops RESPIRATORY:  Clear to auscultation without rales, wheezing or rhonchi  ABDOMEN: Soft, non-tender, non-distended EXTREMITIES:  trace bilateral lower extremity edema, No deformity    Wt Readings from Last 3 Encounters:  06/12/24 65.9 kg  06/04/24 63.5 kg  05/20/24 64.5 kg     EKG today demonstrates  Atypical atrial flutter with variable block, LAFB Vent. rate 112 BPM PR interval * ms QRS duration 90 ms QT/QTcB 292/398 ms   Echo 02/10/24 demonstrated   1. Global hypokinesis with abnormal septal motion. Left ventricular  ejection fraction, by estimation, is 45 to 50%. The left ventricle has  mildly decreased function. The left ventricle demonstrates global  hypokinesis. Left ventricular diastolic parameters are indeterminate.   2. Right ventricular systolic function  is normal. The right ventricular  size is normal. Tricuspid regurgitation signal is inadequate for assessing  PA pressure. The estimated right ventricular systolic pressure is 16.7  mmHg.   3. The mitral valve is abnormal. Mild mitral valve regurgitation. No  evidence of mitral stenosis.   4. The aortic valve is tricuspid. There is moderate calcification of the  aortic valve. There is moderate thickening of the aortic valve. Aortic  valve regurgitation is mild. Aortic valve sclerosis is present, with no  evidence of aortic valve stenosis.   5. The inferior vena cava is normal in size with greater than 50%  respiratory variability, suggesting right atrial pressure of 3 mmHg.   6. Agitated saline contrast bubble study was negative, with no evidence  of any interatrial shunt.    CHA2DS2-VASc Score = 4  The patient's score is based upon:  CHF History: 0 HTN History: 1 Diabetes History: 0 Stroke History: 0 Vascular Disease History: 1 Age Score: 2 Gender Score: 0       ASSESSMENT AND PLAN: Atypical atrial flutter The patient's CHA2DS2-VASc score is 4, indicating a 4.8% annual risk of stroke.   S/p DCCV with quick return of afib. He is not an ablation candidate due to his advanced age. Would avoid class IC with h/o CAD and CHF. Dofetilide and amiodarone interact with quetiapine  which he takes for visual hallucinations. Patient and family opted to pursue a rate control strategy.  Continue Eliquis  5 mg BID. His last Cr was 1.68 but historically his Cr is < 1.5. will continue present dose of Eliquis  for now and recheck bmet at follow up.  Increase Toprol  to 100 mg AM and 50 mg PM Will have him wear a Zio monitor for one week to assess adequacy of rate control.   Secondary Hypercoagulable State (ICD10:  D68.69) The patient is at significant risk for stroke/thromboembolism based upon his CHA2DS2-VASc Score of 4.  Continue Apixaban  (Eliquis ). No bleeding issues.   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 Increase BB as above.     Follow up in the AF clinic in 3 weeks.    Daril Kicks PA-C Afib Clinic [facility] [address] [place], [place] [address] [phone]

## 2024-06-12 NOTE — Patient Instructions (Addendum)
 Start Metoprolol  100 mg ( 2 tabs) in the A.M. and 50 mg ( 1 tab) in the PM.   Wear monitor for 7 days take off 06/19/24 place in box and seal it place in mailbox to send.

## 2024-06-13 DIAGNOSIS — M62561 Muscle wasting and atrophy, not elsewhere classified, right lower leg: Secondary | ICD-10-CM | POA: Diagnosis not present

## 2024-06-13 DIAGNOSIS — M62552 Muscle wasting and atrophy, not elsewhere classified, left thigh: Secondary | ICD-10-CM | POA: Diagnosis not present

## 2024-06-13 DIAGNOSIS — M62562 Muscle wasting and atrophy, not elsewhere classified, left lower leg: Secondary | ICD-10-CM | POA: Diagnosis not present

## 2024-06-13 DIAGNOSIS — M62551 Muscle wasting and atrophy, not elsewhere classified, right thigh: Secondary | ICD-10-CM | POA: Diagnosis not present

## 2024-06-13 DIAGNOSIS — R2689 Other abnormalities of gait and mobility: Secondary | ICD-10-CM | POA: Diagnosis not present

## 2024-06-18 DIAGNOSIS — M62552 Muscle wasting and atrophy, not elsewhere classified, left thigh: Secondary | ICD-10-CM | POA: Diagnosis not present

## 2024-06-18 DIAGNOSIS — R7303 Prediabetes: Secondary | ICD-10-CM | POA: Diagnosis not present

## 2024-06-18 DIAGNOSIS — R2689 Other abnormalities of gait and mobility: Secondary | ICD-10-CM | POA: Diagnosis not present

## 2024-06-18 DIAGNOSIS — M62561 Muscle wasting and atrophy, not elsewhere classified, right lower leg: Secondary | ICD-10-CM | POA: Diagnosis not present

## 2024-06-18 DIAGNOSIS — K5900 Constipation, unspecified: Secondary | ICD-10-CM | POA: Diagnosis not present

## 2024-06-18 DIAGNOSIS — M62551 Muscle wasting and atrophy, not elsewhere classified, right thigh: Secondary | ICD-10-CM | POA: Diagnosis not present

## 2024-06-18 DIAGNOSIS — M62562 Muscle wasting and atrophy, not elsewhere classified, left lower leg: Secondary | ICD-10-CM | POA: Diagnosis not present

## 2024-06-18 DIAGNOSIS — N400 Enlarged prostate without lower urinary tract symptoms: Secondary | ICD-10-CM | POA: Diagnosis not present

## 2024-06-18 DIAGNOSIS — E7800 Pure hypercholesterolemia, unspecified: Secondary | ICD-10-CM | POA: Diagnosis not present

## 2024-06-20 DIAGNOSIS — M62552 Muscle wasting and atrophy, not elsewhere classified, left thigh: Secondary | ICD-10-CM | POA: Diagnosis not present

## 2024-06-20 DIAGNOSIS — M62561 Muscle wasting and atrophy, not elsewhere classified, right lower leg: Secondary | ICD-10-CM | POA: Diagnosis not present

## 2024-06-20 DIAGNOSIS — M62562 Muscle wasting and atrophy, not elsewhere classified, left lower leg: Secondary | ICD-10-CM | POA: Diagnosis not present

## 2024-06-20 DIAGNOSIS — M62551 Muscle wasting and atrophy, not elsewhere classified, right thigh: Secondary | ICD-10-CM | POA: Diagnosis not present

## 2024-06-20 DIAGNOSIS — R2689 Other abnormalities of gait and mobility: Secondary | ICD-10-CM | POA: Diagnosis not present

## 2024-06-26 DIAGNOSIS — M62562 Muscle wasting and atrophy, not elsewhere classified, left lower leg: Secondary | ICD-10-CM | POA: Diagnosis not present

## 2024-06-26 DIAGNOSIS — M62561 Muscle wasting and atrophy, not elsewhere classified, right lower leg: Secondary | ICD-10-CM | POA: Diagnosis not present

## 2024-06-26 DIAGNOSIS — M62552 Muscle wasting and atrophy, not elsewhere classified, left thigh: Secondary | ICD-10-CM | POA: Diagnosis not present

## 2024-06-26 DIAGNOSIS — M62551 Muscle wasting and atrophy, not elsewhere classified, right thigh: Secondary | ICD-10-CM | POA: Diagnosis not present

## 2024-06-26 DIAGNOSIS — R2689 Other abnormalities of gait and mobility: Secondary | ICD-10-CM | POA: Diagnosis not present

## 2024-07-01 DIAGNOSIS — I10 Essential (primary) hypertension: Secondary | ICD-10-CM | POA: Diagnosis not present

## 2024-07-01 DIAGNOSIS — M62561 Muscle wasting and atrophy, not elsewhere classified, right lower leg: Secondary | ICD-10-CM | POA: Diagnosis not present

## 2024-07-01 DIAGNOSIS — E785 Hyperlipidemia, unspecified: Secondary | ICD-10-CM | POA: Diagnosis not present

## 2024-07-01 DIAGNOSIS — R2689 Other abnormalities of gait and mobility: Secondary | ICD-10-CM | POA: Diagnosis not present

## 2024-07-01 DIAGNOSIS — M62552 Muscle wasting and atrophy, not elsewhere classified, left thigh: Secondary | ICD-10-CM | POA: Diagnosis not present

## 2024-07-01 DIAGNOSIS — R441 Visual hallucinations: Secondary | ICD-10-CM | POA: Diagnosis not present

## 2024-07-01 DIAGNOSIS — M62562 Muscle wasting and atrophy, not elsewhere classified, left lower leg: Secondary | ICD-10-CM | POA: Diagnosis not present

## 2024-07-01 DIAGNOSIS — G8929 Other chronic pain: Secondary | ICD-10-CM | POA: Diagnosis not present

## 2024-07-01 DIAGNOSIS — F411 Generalized anxiety disorder: Secondary | ICD-10-CM | POA: Diagnosis not present

## 2024-07-01 DIAGNOSIS — G3184 Mild cognitive impairment, so stated: Secondary | ICD-10-CM | POA: Diagnosis not present

## 2024-07-01 DIAGNOSIS — F331 Major depressive disorder, recurrent, moderate: Secondary | ICD-10-CM | POA: Diagnosis not present

## 2024-07-01 DIAGNOSIS — M62551 Muscle wasting and atrophy, not elsewhere classified, right thigh: Secondary | ICD-10-CM | POA: Diagnosis not present

## 2024-07-02 DIAGNOSIS — I4819 Other persistent atrial fibrillation: Secondary | ICD-10-CM | POA: Diagnosis not present

## 2024-07-03 ENCOUNTER — Ambulatory Visit (HOSPITAL_COMMUNITY)
Admission: RE | Admit: 2024-07-03 | Discharge: 2024-07-03 | Disposition: A | Discharge status: Home / Self Care | Source: Ambulatory Visit | Attending: Physician Assistant | Admitting: Physician Assistant

## 2024-07-03 ENCOUNTER — Ambulatory Visit (HOSPITAL_COMMUNITY): Payer: Self-pay | Admitting: Physician Assistant

## 2024-07-03 VITALS — BP 164/82 | HR 101 | Ht 67.0 in | Wt 144.0 lb

## 2024-07-03 DIAGNOSIS — D6869 Other thrombophilia: Secondary | ICD-10-CM | POA: Insufficient documentation

## 2024-07-03 DIAGNOSIS — I4819 Other persistent atrial fibrillation: Secondary | ICD-10-CM | POA: Diagnosis not present

## 2024-07-03 DIAGNOSIS — I484 Atypical atrial flutter: Secondary | ICD-10-CM | POA: Diagnosis not present

## 2024-07-03 MED ORDER — FLUTICASONE PROPIONATE 50 MCG/ACT NA SUSP: 2.0000 | Freq: Every day | NASAL | Status: AC

## 2024-07-03 NOTE — Addendum Note (Signed)
 Encounter addended by: Janel Nancy SAUNDERS, RN on: 07/03/2024 4:34 PM  Actions taken: Order list changed, Diagnosis association updated

## 2024-07-03 NOTE — Progress Notes (Signed)
 Primary Care Physician: Patient, No Pcp Per Primary Cardiologist: Dorn Lesches, MD Electrophysiologist: None  Referring Physician: Damien Braver NP   Donald Todd is a 89 y.o. male with a history of CAD, HTN, HLD, renal mass s/p nephrectomy, blindness, atrial flutter, atrial fibrillation who presents for follow up in the [facility].  The patient was initially diagnosed with atrial fibrillation 02/09/24 after presenting to the ED with symptoms of nausea, vomiting, and diarrhea in setting of Norovirus. ECG showed afib with RVR. He was started on Eliquis  for stroke prevention. Cardiology was consulted in the setting of new onset atrial fibrillation. Echocardiogram showed EF 45 to 50%, global hypokinesis, mild mitral valve regurgitation, mild aortic valve regurgitation.    Patient is s/p DCCV on 05/01/24 with quick return of afib. Patient and family opted for a rate control strategy. He was seen at the ED 06/04/24 with rapid afib and intermittent chest discomfort. Per EMS, rates were in the 140's. His BB was increased. Zio monitor showed 100% flutter burden with avg HR 79 bpm.   Patient returns for follow up for atrial fibrillation and atrial flutter. He states that he feels well from a cardiac standpoint. He does have issues with cerumen impactions occasionally and reports that he needs to have his ears cleaned again. He has associated headaches. No bleeding issues on anticoagulation.   Today, he  denies symptoms of palpitations, chest pain, shortness of breath, orthopnea, PND, lower extremity edema, dizziness, presyncope, syncope, snoring, daytime somnolence, bleeding, or neurologic sequela. The patient is tolerating medications without difficulties and is otherwise without complaint today.    Atrial Fibrillation Risk Factors:  he does not have symptoms or diagnosis of sleep apnea. he does not have a history of rheumatic fever.   Atrial Fibrillation Management  history:  Previous antiarrhythmic drugs: none Previous cardioversions: 05/01/24 Previous ablations: none Anticoagulation history: Eliquis   ROS- All systems are reviewed and negative except as per the HPI above.  Past Medical History:  Diagnosis Date   Arthritis    CAD (coronary artery disease)    last cath. 07-09-2010   Cancer [facility])    kidney cancer , prostate cancer    Chronic kidney disease    lt kidnet removed   H/O cardiovascular stress test 08-04-2010   ef 55% NO ISCHEMIA   H/O unilateral nephrectomy    lt. nephrectomy   Heart attack (HCC) 1993   History of kidney stones    Hyperlipemia    Hypertension    renal dopplers 218.13-normal patency   Inguinal hernia 08/01/11   right   Macular degeneration    Prostate cancer (HCC)    Prostate disease    Cancer S/P seed implant   Renal mass    Vertigo     Current Outpatient Medications  Medication Sig Dispense Refill   acetaminophen  (TYLENOL ) 325 MG tablet Take 1-2 tablets (325-650 mg total) by mouth every 4 (four) hours as needed for mild pain.     amLODipine  (NORVASC ) 5 MG tablet Take 5 mg by mouth daily.     apixaban  (ELIQUIS ) 5 MG TABS tablet Take 1 tablet (5 mg total) by mouth 2 (two) times daily. 60 tablet 2   atorvastatin  (LIPITOR) 20 MG tablet Take 20 mg by mouth every evening.     docusate sodium  (COLACE) 100 MG capsule Take 1 capsule (100 mg total) by mouth every 12 (twelve) hours. 60 capsule 0   metoprolol  succinate (TOPROL -XL) 50 MG 24 hr tablet  Take 100 mg in A.M. and 50 mg in P.M. 90 tablet 6   Nutritional Supplements (ENSURE ORIGINAL) LIQD Take 1 Container by mouth 2 (two) times daily between meals.     Pedialyte (PEDIALYTE) SOLN Take 236 mLs by mouth 3 (three) times daily as needed (dehydration secondary to nausea).     polyethylene glycol (MIRALAX  / GLYCOLAX ) 17 g packet Take 17 g by mouth daily. 14 each 2   calcium  carbonate (TUMS - DOSED IN MG ELEMENTAL CALCIUM ) 500 MG chewable tablet Chew 1,000 mg by mouth  daily as needed for indigestion or heartburn.     ipratropium (ATROVENT ) 0.03 % nasal spray Place 1-2 sprays into both nostrils 2 (two) times daily as needed (allergies).     loperamide (IMODIUM A-D) 2 MG tablet Take 2 mg by mouth every 6 (six) hours as needed for diarrhea or loose stools.     loratadine  (CLARITIN ) 10 MG tablet Take 10 mg by mouth every evening.     meclizine  (ANTIVERT ) 25 MG tablet Take 25 mg by mouth every 12 (twelve) hours as needed for dizziness.     Multiple Vitamins-Minerals (PRESERVISION AREDS 2) CAPS Take 1 capsule by mouth 2 (two) times daily.     nitroGLYCERIN  (NITROSTAT ) 0.4 MG SL tablet Place 1 tablet (0.4 mg total) under the tongue every 5 (five) minutes as needed for chest pain (Max 3 doeses). 25 tablet 3   QUEtiapine  (SEROQUEL ) 25 MG tablet Take 1 tablet (25 mg total) by mouth at bedtime. 30 tablet 0   tamsulosin  (FLOMAX ) 0.4 MG CAPS capsule Take 1 capsule (0.4 mg total) by mouth daily after supper. 180 capsule 3   No current facility-administered medications for this encounter.    Physical Exam: BP (!) 164/82   Pulse (!) 101   Ht 5' 7 (1.702 m)   Wt 65.3 kg   BMI 22.55 kg/m   GEN: Well nourished, well developed in no acute distress CARDIAC: Irregularly irregular rate and rhythm, no murmurs, rubs, gallops RESPIRATORY:  Clear to auscultation without rales, wheezing or rhonchi  ABDOMEN: Soft, non-tender, non-distended EXTREMITIES:  No edema; No deformity    Wt Readings from Last 3 Encounters:  07/03/24 65.3 kg  06/12/24 65.9 kg  06/04/24 63.5 kg     EKG today demonstrates  Atypical atrial flutter with variable block, LAFB Vent. rate 101 BPM PR interval * ms QRS duration 88 ms QT/QTcB 340/440 ms   Echo 02/10/24 demonstrated   1. Global hypokinesis with abnormal septal motion. Left ventricular  ejection fraction, by estimation, is 45 to 50%. The left ventricle has  mildly decreased function. The left ventricle demonstrates global  hypokinesis.  Left ventricular diastolic parameters are indeterminate.   2. Right ventricular systolic function is normal. The right ventricular  size is normal. Tricuspid regurgitation signal is inadequate for assessing  PA pressure. The estimated right ventricular systolic pressure is 16.7  mmHg.   3. The mitral valve is abnormal. Mild mitral valve regurgitation. No  evidence of mitral stenosis.   4. The aortic valve is tricuspid. There is moderate calcification of the  aortic valve. There is moderate thickening of the aortic valve. Aortic  valve regurgitation is mild. Aortic valve sclerosis is present, with no  evidence of aortic valve stenosis.   5. The inferior vena cava is normal in size with greater than 50%  respiratory variability, suggesting right atrial pressure of 3 mmHg.   6. Agitated saline contrast bubble study was negative, with no evidence  of any interatrial shunt.    CHA2DS2-VASc Score = 4  The patient's score is based upon: CHF History: 0 HTN History: 1 Diabetes History: 0 Stroke History: 0 Vascular Disease History: 1 Age Score: 2 Gender Score: 0       ASSESSMENT AND PLAN: Persistent atrial fibrillation/Atypical atrial flutter The patient's CHA2DS2-VASc score is 4, indicating a 4.8% annual risk of stroke.   S/p DCCV with quick return of atrial flutter. He is not a candidate for ablation due to his advanced age. Would avoid class IC due to h/o CAD and CHF. Dofetilide and amiodarone interact with quetiapine  which he takes for visual hallucinations.  Zio monitor showed 100% flutter burden with avg HR 79 bpm Continue Toprol  100 mg AM and 50 mg PM Continue Eliquis  5 mg BID, recheck bmet.   Secondary Hypercoagulable State (ICD10:  D68.69) The patient is at significant risk for stroke/thromboembolism based upon his CHA2DS2-VASc Score of 4.  Continue Apixaban  (Eliquis ). No bleeding issues.   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 AF clinic as needed.    Follow up with Dr Court as scheduled.    Daril Kicks PA-C Afib Clinic [facility] [address] [place], [place] [address] [phone]

## 2024-07-04 DIAGNOSIS — M62521 Muscle wasting and atrophy, not elsewhere classified, right upper arm: Secondary | ICD-10-CM | POA: Diagnosis not present

## 2024-07-04 DIAGNOSIS — R2689 Other abnormalities of gait and mobility: Secondary | ICD-10-CM | POA: Diagnosis not present

## 2024-07-04 DIAGNOSIS — R488 Other symbolic dysfunctions: Secondary | ICD-10-CM | POA: Diagnosis not present

## 2024-07-04 DIAGNOSIS — M62551 Muscle wasting and atrophy, not elsewhere classified, right thigh: Secondary | ICD-10-CM | POA: Diagnosis not present

## 2024-07-04 DIAGNOSIS — M62552 Muscle wasting and atrophy, not elsewhere classified, left thigh: Secondary | ICD-10-CM | POA: Diagnosis not present

## 2024-07-04 DIAGNOSIS — M62561 Muscle wasting and atrophy, not elsewhere classified, right lower leg: Secondary | ICD-10-CM | POA: Diagnosis not present

## 2024-07-04 DIAGNOSIS — R2681 Unsteadiness on feet: Secondary | ICD-10-CM | POA: Diagnosis not present

## 2024-07-04 DIAGNOSIS — M62562 Muscle wasting and atrophy, not elsewhere classified, left lower leg: Secondary | ICD-10-CM | POA: Diagnosis not present

## 2024-07-05 DIAGNOSIS — M62561 Muscle wasting and atrophy, not elsewhere classified, right lower leg: Secondary | ICD-10-CM | POA: Diagnosis not present

## 2024-07-05 DIAGNOSIS — R2689 Other abnormalities of gait and mobility: Secondary | ICD-10-CM | POA: Diagnosis not present

## 2024-07-05 DIAGNOSIS — R2681 Unsteadiness on feet: Secondary | ICD-10-CM | POA: Diagnosis not present

## 2024-07-05 DIAGNOSIS — R1310 Dysphagia, unspecified: Secondary | ICD-10-CM | POA: Diagnosis not present

## 2024-07-05 DIAGNOSIS — R488 Other symbolic dysfunctions: Secondary | ICD-10-CM | POA: Diagnosis not present

## 2024-07-05 DIAGNOSIS — R4185 Anosognosia: Secondary | ICD-10-CM | POA: Diagnosis not present

## 2024-07-05 DIAGNOSIS — M62551 Muscle wasting and atrophy, not elsewhere classified, right thigh: Secondary | ICD-10-CM | POA: Diagnosis not present

## 2024-07-05 DIAGNOSIS — M62552 Muscle wasting and atrophy, not elsewhere classified, left thigh: Secondary | ICD-10-CM | POA: Diagnosis not present

## 2024-07-05 DIAGNOSIS — R1312 Dysphagia, oropharyngeal phase: Secondary | ICD-10-CM | POA: Diagnosis not present

## 2024-07-05 DIAGNOSIS — M62562 Muscle wasting and atrophy, not elsewhere classified, left lower leg: Secondary | ICD-10-CM | POA: Diagnosis not present

## 2024-07-05 DIAGNOSIS — R4789 Other speech disturbances: Secondary | ICD-10-CM | POA: Diagnosis not present

## 2024-07-05 DIAGNOSIS — M62521 Muscle wasting and atrophy, not elsewhere classified, right upper arm: Secondary | ICD-10-CM | POA: Diagnosis not present

## 2024-07-08 DIAGNOSIS — M62561 Muscle wasting and atrophy, not elsewhere classified, right lower leg: Secondary | ICD-10-CM | POA: Diagnosis not present

## 2024-07-08 DIAGNOSIS — R2689 Other abnormalities of gait and mobility: Secondary | ICD-10-CM | POA: Diagnosis not present

## 2024-07-08 DIAGNOSIS — M62552 Muscle wasting and atrophy, not elsewhere classified, left thigh: Secondary | ICD-10-CM | POA: Diagnosis not present

## 2024-07-08 DIAGNOSIS — M62521 Muscle wasting and atrophy, not elsewhere classified, right upper arm: Secondary | ICD-10-CM | POA: Diagnosis not present

## 2024-07-08 DIAGNOSIS — R2681 Unsteadiness on feet: Secondary | ICD-10-CM | POA: Diagnosis not present

## 2024-07-08 DIAGNOSIS — R4789 Other speech disturbances: Secondary | ICD-10-CM | POA: Diagnosis not present

## 2024-07-08 DIAGNOSIS — R488 Other symbolic dysfunctions: Secondary | ICD-10-CM | POA: Diagnosis not present

## 2024-07-08 DIAGNOSIS — M62562 Muscle wasting and atrophy, not elsewhere classified, left lower leg: Secondary | ICD-10-CM | POA: Diagnosis not present

## 2024-07-08 DIAGNOSIS — R1312 Dysphagia, oropharyngeal phase: Secondary | ICD-10-CM | POA: Diagnosis not present

## 2024-07-08 DIAGNOSIS — R1310 Dysphagia, unspecified: Secondary | ICD-10-CM | POA: Diagnosis not present

## 2024-07-08 DIAGNOSIS — M62551 Muscle wasting and atrophy, not elsewhere classified, right thigh: Secondary | ICD-10-CM | POA: Diagnosis not present

## 2024-07-08 DIAGNOSIS — R4185 Anosognosia: Secondary | ICD-10-CM | POA: Diagnosis not present

## 2024-07-09 ENCOUNTER — Other Ambulatory Visit: Payer: Self-pay

## 2024-07-09 ENCOUNTER — Encounter (HOSPITAL_COMMUNITY): Payer: Self-pay | Admitting: Emergency Medicine

## 2024-07-09 ENCOUNTER — Emergency Department (HOSPITAL_COMMUNITY)
Admission: EM | Admit: 2024-07-09 | Discharge: 2024-07-09 | Disposition: A | Discharge status: Home / Self Care | Attending: Emergency Medicine | Admitting: Emergency Medicine

## 2024-07-09 ENCOUNTER — Emergency Department (HOSPITAL_COMMUNITY)

## 2024-07-09 DIAGNOSIS — R600 Localized edema: Secondary | ICD-10-CM | POA: Diagnosis not present

## 2024-07-09 DIAGNOSIS — R7989 Other specified abnormal findings of blood chemistry: Secondary | ICD-10-CM | POA: Insufficient documentation

## 2024-07-09 DIAGNOSIS — R109 Unspecified abdominal pain: Secondary | ICD-10-CM | POA: Diagnosis not present

## 2024-07-09 DIAGNOSIS — I4892 Unspecified atrial flutter: Secondary | ICD-10-CM | POA: Diagnosis not present

## 2024-07-09 DIAGNOSIS — M62521 Muscle wasting and atrophy, not elsewhere classified, right upper arm: Secondary | ICD-10-CM | POA: Diagnosis not present

## 2024-07-09 DIAGNOSIS — R42 Dizziness and giddiness: Secondary | ICD-10-CM | POA: Diagnosis not present

## 2024-07-09 DIAGNOSIS — Z79899 Other long term (current) drug therapy: Secondary | ICD-10-CM | POA: Insufficient documentation

## 2024-07-09 DIAGNOSIS — Z7901 Long term (current) use of anticoagulants: Secondary | ICD-10-CM | POA: Insufficient documentation

## 2024-07-09 DIAGNOSIS — R0789 Other chest pain: Secondary | ICD-10-CM | POA: Diagnosis not present

## 2024-07-09 DIAGNOSIS — R2681 Unsteadiness on feet: Secondary | ICD-10-CM | POA: Diagnosis not present

## 2024-07-09 DIAGNOSIS — R519 Headache, unspecified: Secondary | ICD-10-CM | POA: Diagnosis not present

## 2024-07-09 DIAGNOSIS — I4891 Unspecified atrial fibrillation: Secondary | ICD-10-CM | POA: Diagnosis not present

## 2024-07-09 DIAGNOSIS — R079 Chest pain, unspecified: Secondary | ICD-10-CM | POA: Diagnosis not present

## 2024-07-09 DIAGNOSIS — I499 Cardiac arrhythmia, unspecified: Secondary | ICD-10-CM | POA: Diagnosis not present

## 2024-07-09 DIAGNOSIS — R488 Other symbolic dysfunctions: Secondary | ICD-10-CM | POA: Diagnosis not present

## 2024-07-09 LAB — BASIC METABOLIC PANEL WITH GFR
Anion gap: 10 (ref 5–15)
BUN: 25 mg/dL — ABNORMAL HIGH (ref 8–23)
CO2: 25 mmol/L (ref 22–32)
Calcium: 9 mg/dL (ref 8.9–10.3)
Chloride: 105 mmol/L (ref 98–111)
Creatinine, Ser: 1.51 mg/dL — ABNORMAL HIGH (ref 0.61–1.24)
GFR, Estimated: 43 mL/min — ABNORMAL LOW (ref >60)
Glucose, Bld: 119 mg/dL — ABNORMAL HIGH (ref 70–99)
Potassium: 4.4 mmol/L (ref 3.5–5.1)
Sodium: 140 mmol/L (ref 135–145)

## 2024-07-09 LAB — CBC
HCT: 40.9 % (ref 39.0–52.0)
Hemoglobin: 13 g/dL (ref 13.0–17.0)
MCH: 31.3 pg (ref 26.0–34.0)
MCHC: 31.8 g/dL (ref 30.0–36.0)
MCV: 98.6 fL (ref 80.0–100.0)
Platelets: 131 K/uL — ABNORMAL LOW (ref 150–400)
RBC: 4.15 MIL/uL — ABNORMAL LOW (ref 4.22–5.81)
RDW: 13.7 % (ref 11.5–15.5)
WBC: 6 K/uL (ref 4.0–10.5)
nRBC: 0 % (ref 0.0–0.2)

## 2024-07-09 LAB — TROPONIN I (HIGH SENSITIVITY)
Troponin I (High Sensitivity): 15 ng/L (ref <18)
Troponin I (High Sensitivity): 15 ng/L (ref <18)

## 2024-07-09 MED ORDER — METOPROLOL TARTRATE 5 MG/5ML IV SOLN
5.0000 mg | Freq: Once | INTRAVENOUS | Status: AC
  Administered 2024-07-09: 5 mg via INTRAVENOUS
  Filled 2024-07-09: qty 5

## 2024-07-09 NOTE — ED Provider Notes (Signed)
 [place] EMERGENCY DEPARTMENT AT [facility] Provider Note   CSN: [id number] Arrival date & time: 07/09/24  1240     Patient presents with: Dizziness   Donald Todd is a 89 y.o. male.  {Add pertinent medical, surgical, social history, OB history to HPI:32947}  Dizziness    Pt states he was having some trouble with dizziness, chest pain, headache today.  He has a little bit every day but today it was stronger than usual.  Pt states it has resolved at this time.    The headache was not severe.  No vomiting, no weakness.  No trouble with speech or vision.  The chest pain was sharp in nature.  He did not feel his heart racing today.   Nothing made it worse in particular.  No fever or coughing.  Pt does use a wheelchair and walker intermittently. Prior to Admission medications   Medication Sig Start Date End Date Taking? Authorizing Provider  acetaminophen  (TYLENOL ) 325 MG tablet Take 1-2 tablets (325-650 mg total) by mouth every 4 (four) hours as needed for mild pain. 02/21/23   Love, Sharlet RAMAN, PA-C  amLODipine  (NORVASC ) 5 MG tablet Take 5 mg by mouth daily.    [provider]  apixaban  (ELIQUIS ) 5 MG TABS tablet Take 1 tablet (5 mg total) by mouth 2 (two) times daily. 02/15/24   Drusilla Sabas RAMAN, MD  atorvastatin  (LIPITOR) 20 MG tablet Take 20 mg by mouth every evening.    [provider]  calcium  carbonate (TUMS - DOSED IN MG ELEMENTAL CALCIUM ) 500 MG chewable tablet Chew 1,000 mg by mouth daily as needed for indigestion or heartburn.    [provider]  docusate sodium  (COLACE) 100 MG capsule Take 1 capsule (100 mg total) by mouth every 12 (twelve) hours. 03/10/21   Raford Lenis, MD  fluticasone  (FLONASE ) 50 MCG/ACT nasal spray Place 2 sprays into both nostrils daily. 07/03/24 07/03/25  Fenton, Clint R, PA  ipratropium (ATROVENT ) 0.03 % nasal spray Place 1-2 sprays into both nostrils 2 (two) times daily as needed (allergies). 05/29/20   [provider]  loperamide (IMODIUM A-D) 2 MG tablet Take 2 mg by mouth every 6 (six) hours as needed for diarrhea or loose stools.    [provider]  loratadine  (CLARITIN ) 10 MG tablet Take 10 mg by mouth every evening. 09/29/21   [provider]  meclizine  (ANTIVERT ) 25 MG tablet Take 25 mg by mouth every 12 (twelve) hours as needed for dizziness.    [provider]  metoprolol  succinate (TOPROL -XL) 50 MG 24 hr tablet Take 100 mg in A.M. and 50 mg in P.M. 06/12/24   Fenton, Clint R, PA  Multiple Vitamins-Minerals (PRESERVISION AREDS 2) CAPS Take 1 capsule by mouth 2 (two) times daily.    [provider]  nitroGLYCERIN  (NITROSTAT ) 0.4 MG SL tablet Place 1 tablet (0.4 mg total) under the tongue every 5 (five) minutes as needed for chest pain (Max 3 doeses). Patient not taking: Reported on 07/03/2024 02/15/24   Drusilla Sabas RAMAN, MD  Nutritional Supplements (ENSURE ORIGINAL) LIQD Take 1 Container by mouth 2 (two) times daily between meals.    [provider]  Pedialyte (PEDIALYTE) SOLN Take 236 mLs by mouth 3 (three) times daily as needed (dehydration secondary to nausea).    [provider]  polyethylene glycol (MIRALAX  / GLYCOLAX ) 17 g packet Take 17 g by mouth daily. 03/10/21   Raford Lenis, MD  QUEtiapine  (SEROQUEL ) 25 MG  tablet Take 1 tablet (25 mg total) by mouth at bedtime. 02/21/23   Love, Sharlet RAMAN, PA-C  tamsulosin  (FLOMAX ) 0.4 MG CAPS capsule Take 1 capsule (0.4 mg total) by mouth daily after supper. 02/15/24   Drusilla Sabas RAMAN, MD    Allergies: Tape, Hydrocodone , Iohexol, and Roxicodone  [oxycodone ]    Review of Systems  Neurological:  Positive for dizziness.    Updated Vital Signs BP (!) 181/103   Pulse 84   Temp 98.2 F (36.8 C)   Resp 13   Ht 1.702 m (5' 7)   Wt 65.3 kg   SpO2 100%   BMI 22.55 kg/m   Physical Exam Vitals and nursing note reviewed.  Constitutional:      General: He is not in acute distress.    Appearance: He is  well-developed.  HENT:     Head: Normocephalic and atraumatic.     Right Ear: External ear normal.     Left Ear: External ear normal.  Eyes:     General: No visual field deficit or scleral icterus.       Right eye: No discharge.        Left eye: No discharge.     Conjunctiva/sclera: Conjunctivae normal.  Neck:     Trachea: No tracheal deviation.  Cardiovascular:     Rate and Rhythm: Normal rate. Rhythm irregular.  Pulmonary:     Effort: Pulmonary effort is normal. No respiratory distress.     Breath sounds: Normal breath sounds. No stridor. No wheezing or rales.  Abdominal:     General: Bowel sounds are normal. There is no distension.     Palpations: Abdomen is soft.     Tenderness: There is no abdominal tenderness. There is no guarding or rebound.  Musculoskeletal:        General: No tenderness or deformity.     Cervical back: Neck supple.     Right lower leg: No edema.     Left lower leg: Edema present.  Skin:    General: Skin is warm and dry.     Findings: No rash.  Neurological:     General: No focal deficit present.     Mental Status: He is alert and oriented to person, place, and time.     Cranial Nerves: No cranial nerve deficit, dysarthria or facial asymmetry.     Sensory: No sensory deficit.     Motor: No abnormal muscle tone, seizure activity or pronator drift.     Coordination: Coordination normal.     Comments:  able to hold both legs off bed for 5 seconds, sensation intact in all extremities,  no left or right sided neglect, , no nystagmus noted   Psychiatric:        Mood and Affect: Mood normal.     (all labs ordered are listed, but only abnormal results are displayed) Labs Reviewed  BASIC METABOLIC PANEL WITH GFR - Abnormal; Notable for the following components:      Result Value   Glucose, Bld 119 (*)    BUN 25 (*)    Creatinine, Ser 1.51 (*)    GFR, Estimated 43 (*)    All other components within normal limits  CBC - Abnormal; Notable for the  following components:   RBC 4.15 (*)    Platelets 131 (*)    All other components within normal limits  TROPONIN I (HIGH SENSITIVITY)  TROPONIN I (HIGH SENSITIVITY)    EKG: EKG Interpretation Date/Time:  Tuesday July 09 2024 12:47:22 EDT Ventricular Rate:  67 PR Interval:    QRS Duration:  94 QT Interval:  394 QTC Calculation: 416 R Axis:   -76  Text Interpretation: Atrial flutter with 4:1 A-V conduction Pulmonary disease pattern Left anterior fascicular block Left ventricular hypertrophy ( R in aVL , Romhilt-Estes ) ST & T wave abnormality, consider lateral ischemia Abnormal ECG When compared with ECG of 03-Jul-2024 15:07, Since last tracing rate slower Confirmed by Randol Simmonds [phone]) on 07/09/2024 4:01:29 PM  Radiology: [facility] Chest 2 View Result Date: 07/09/2024 CLINICAL DATA:  Chest pain.  Dizziness.  Headache.  Abdominal pain. EXAM: CHEST - 2 VIEW COMPARISON:  02/13/2023 FINDINGS: Cardiomediastinal silhouette and pulmonary vasculature are within normal limits. Lungs are clear. Severe degenerative changes of the RIGHT shoulder again seen. LEFT shoulder prosthesis partially visualized. Deformity of LEFT seventh rib consistent with remote healed fracture. IMPRESSION: No acute cardiopulmonary process. Electronically Signed   By: Aliene Lloyd M.D.   On: 07/09/2024 14:31    {Document cardiac monitor, telemetry assessment procedure when appropriate:32947} Procedures   Medications Ordered in the ED  metoprolol  tartrate (LOPRESSOR ) injection 5 mg (5 mg Intravenous Given 07/09/24 1618)    Clinical Course as of 07/09/24 1840  Tue Jul 09, 2024  1607 Chest x-ray without acute findings.  CBC normal. [JK]  1607 Basic metabolic panel(!) Metabolic panel shows elevated creatinine but this is similar to baseline.  Additional troponin normal [JK]    Clinical Course User Index [JK] Randol Simmonds, MD   {Click here for ABCD2, HEART and other calculators REFRESH Note before signing:1}                               Medical Decision Making Problems Addressed: Atrial flutter, unspecified type [facility]): acute illness or injury that poses a threat to life or bodily functions Chest pain, unspecified type: acute illness or injury that poses a threat to life or bodily functions  Amount and/or Complexity of Data Reviewed Labs: ordered. Decision-making details documented in ED Course. Radiology: ordered and independent interpretation performed.  Risk Prescription drug management.  Patient presented to the ED for evaluation of chest discomfort.  Patient has history of coronary artery disease.  Symptoms were atypical and have resolved.  ED workup reassuring.  Moderate risk heart score but serial troponins are normal.  He is symptom-free in the ED.  Doubt acute coronary syndrome.  Patient appears appropriate to follow-up with his cardiologist as an outpatient.  No signs of pneumonia.  No findings to suggest CHF.  Patient does have known atrial flutter.  He was more tachycardic initially.  This has improved with treatment in the ED.  Patient has been monitored and has had no recurrent tachycardia.  It is possible that contributed to his symptoms initially.  Will have patient follow-up with his cardiologist outpatient.  Return to the ED for any recurrent symptoms   {Document critical care time when appropriate  Document review of labs and clinical decision tools ie CHADS2VASC2, etc  Document your independent review of radiology images and any outside records  Document your discussion with family members, caretakers and with consultants  Document social determinants of health affecting pt's care  Document your decision making why or why not admission, treatments were needed:32947:::1}   Final diagnoses:  Atrial flutter, unspecified type (HCC)  Chest pain, unspecified type    ED Discharge Orders     None

## 2024-07-09 NOTE — ED Notes (Signed)
 Patient transported to X-ray

## 2024-07-09 NOTE — ED Notes (Signed)
 Patient Alert and oriented to baseline. Stable and ambulatory to baseline. Patient verbalized understanding of the discharge instructions.  Patient belongings were taken by the patient.

## 2024-07-09 NOTE — ED Notes (Signed)
 Pt reports intermittent headache, dizziness and chest pain. Currently denies all 3. Pt in NAD.

## 2024-07-09 NOTE — ED Triage Notes (Signed)
 Per [facility] pt coming from [facility] c/o dizziness, headache, chest pain and abdominal pain that is all intermittent ongoing for months. Patient states he experiences these symptoms daily. Ems repot initially in a-fib RVR 100-130. Administered 400 CC NS and state HR into 60s. Patient also c/o bilateral knee pain that he feels daily.

## 2024-07-09 NOTE — Discharge Instructions (Signed)
 Continue your medications.  Follow-up with your heart doctor to be rechecked.  Return to the ER for recurrent symptoms

## 2024-07-10 DIAGNOSIS — R4185 Anosognosia: Secondary | ICD-10-CM | POA: Diagnosis not present

## 2024-07-10 DIAGNOSIS — R1310 Dysphagia, unspecified: Secondary | ICD-10-CM | POA: Diagnosis not present

## 2024-07-10 DIAGNOSIS — R488 Other symbolic dysfunctions: Secondary | ICD-10-CM | POA: Diagnosis not present

## 2024-07-10 DIAGNOSIS — R4789 Other speech disturbances: Secondary | ICD-10-CM | POA: Diagnosis not present

## 2024-07-10 DIAGNOSIS — R1312 Dysphagia, oropharyngeal phase: Secondary | ICD-10-CM | POA: Diagnosis not present

## 2024-07-11 ENCOUNTER — Ambulatory Visit: Admitting: Cardiovascular Disease

## 2024-07-11 DIAGNOSIS — I4891 Unspecified atrial fibrillation: Secondary | ICD-10-CM | POA: Diagnosis not present

## 2024-07-11 DIAGNOSIS — R079 Chest pain, unspecified: Secondary | ICD-10-CM | POA: Diagnosis not present

## 2024-07-11 DIAGNOSIS — R519 Headache, unspecified: Secondary | ICD-10-CM | POA: Diagnosis not present

## 2024-07-11 DIAGNOSIS — M62521 Muscle wasting and atrophy, not elsewhere classified, right upper arm: Secondary | ICD-10-CM | POA: Diagnosis not present

## 2024-07-11 DIAGNOSIS — R42 Dizziness and giddiness: Secondary | ICD-10-CM | POA: Diagnosis not present

## 2024-07-11 DIAGNOSIS — R488 Other symbolic dysfunctions: Secondary | ICD-10-CM | POA: Diagnosis not present

## 2024-07-11 DIAGNOSIS — R2681 Unsteadiness on feet: Secondary | ICD-10-CM | POA: Diagnosis not present

## 2024-07-12 DIAGNOSIS — M62552 Muscle wasting and atrophy, not elsewhere classified, left thigh: Secondary | ICD-10-CM | POA: Diagnosis not present

## 2024-07-12 DIAGNOSIS — M62562 Muscle wasting and atrophy, not elsewhere classified, left lower leg: Secondary | ICD-10-CM | POA: Diagnosis not present

## 2024-07-12 DIAGNOSIS — M62551 Muscle wasting and atrophy, not elsewhere classified, right thigh: Secondary | ICD-10-CM | POA: Diagnosis not present

## 2024-07-12 DIAGNOSIS — M62561 Muscle wasting and atrophy, not elsewhere classified, right lower leg: Secondary | ICD-10-CM | POA: Diagnosis not present

## 2024-07-12 DIAGNOSIS — R2689 Other abnormalities of gait and mobility: Secondary | ICD-10-CM | POA: Diagnosis not present

## 2024-07-15 DIAGNOSIS — M62521 Muscle wasting and atrophy, not elsewhere classified, right upper arm: Secondary | ICD-10-CM | POA: Diagnosis not present

## 2024-07-15 DIAGNOSIS — R488 Other symbolic dysfunctions: Secondary | ICD-10-CM | POA: Diagnosis not present

## 2024-07-15 DIAGNOSIS — R2681 Unsteadiness on feet: Secondary | ICD-10-CM | POA: Diagnosis not present

## 2024-07-16 DIAGNOSIS — R4185 Anosognosia: Secondary | ICD-10-CM | POA: Diagnosis not present

## 2024-07-16 DIAGNOSIS — R2681 Unsteadiness on feet: Secondary | ICD-10-CM | POA: Diagnosis not present

## 2024-07-16 DIAGNOSIS — H6123 Impacted cerumen, bilateral: Secondary | ICD-10-CM | POA: Diagnosis not present

## 2024-07-16 DIAGNOSIS — M62562 Muscle wasting and atrophy, not elsewhere classified, left lower leg: Secondary | ICD-10-CM | POA: Diagnosis not present

## 2024-07-16 DIAGNOSIS — R488 Other symbolic dysfunctions: Secondary | ICD-10-CM | POA: Diagnosis not present

## 2024-07-16 DIAGNOSIS — M62552 Muscle wasting and atrophy, not elsewhere classified, left thigh: Secondary | ICD-10-CM | POA: Diagnosis not present

## 2024-07-16 DIAGNOSIS — R1310 Dysphagia, unspecified: Secondary | ICD-10-CM | POA: Diagnosis not present

## 2024-07-16 DIAGNOSIS — G44229 Chronic tension-type headache, not intractable: Secondary | ICD-10-CM | POA: Diagnosis not present

## 2024-07-16 DIAGNOSIS — M62521 Muscle wasting and atrophy, not elsewhere classified, right upper arm: Secondary | ICD-10-CM | POA: Diagnosis not present

## 2024-07-16 DIAGNOSIS — R42 Dizziness and giddiness: Secondary | ICD-10-CM | POA: Diagnosis not present

## 2024-07-16 DIAGNOSIS — R1312 Dysphagia, oropharyngeal phase: Secondary | ICD-10-CM | POA: Diagnosis not present

## 2024-07-16 DIAGNOSIS — M62551 Muscle wasting and atrophy, not elsewhere classified, right thigh: Secondary | ICD-10-CM | POA: Diagnosis not present

## 2024-07-16 DIAGNOSIS — I10 Essential (primary) hypertension: Secondary | ICD-10-CM | POA: Diagnosis not present

## 2024-07-16 DIAGNOSIS — R2689 Other abnormalities of gait and mobility: Secondary | ICD-10-CM | POA: Diagnosis not present

## 2024-07-16 DIAGNOSIS — R4789 Other speech disturbances: Secondary | ICD-10-CM | POA: Diagnosis not present

## 2024-07-16 DIAGNOSIS — M62561 Muscle wasting and atrophy, not elsewhere classified, right lower leg: Secondary | ICD-10-CM | POA: Diagnosis not present

## 2024-07-16 DIAGNOSIS — I4891 Unspecified atrial fibrillation: Secondary | ICD-10-CM | POA: Diagnosis not present

## 2024-07-17 ENCOUNTER — Ambulatory Visit: Attending: Cardiovascular Disease | Admitting: Cardiovascular Disease

## 2024-07-17 ENCOUNTER — Encounter: Payer: Self-pay | Admitting: Cardiovascular Disease

## 2024-07-17 VITALS — BP 122/80 | HR 64 | Ht 67.0 in | Wt 143.4 lb

## 2024-07-17 DIAGNOSIS — E785 Hyperlipidemia, unspecified: Secondary | ICD-10-CM | POA: Diagnosis not present

## 2024-07-17 DIAGNOSIS — Z9861 Coronary angioplasty status: Secondary | ICD-10-CM | POA: Diagnosis not present

## 2024-07-17 DIAGNOSIS — I519 Heart disease, unspecified: Secondary | ICD-10-CM | POA: Insufficient documentation

## 2024-07-17 DIAGNOSIS — I10 Essential (primary) hypertension: Secondary | ICD-10-CM | POA: Diagnosis not present

## 2024-07-17 DIAGNOSIS — I2510 Atherosclerotic heart disease of native coronary artery without angina pectoris: Secondary | ICD-10-CM | POA: Diagnosis not present

## 2024-07-17 DIAGNOSIS — I4819 Other persistent atrial fibrillation: Secondary | ICD-10-CM | POA: Diagnosis not present

## 2024-07-17 NOTE — Assessment & Plan Note (Signed)
 EF by recent 2D echo performed 02/10/2024 was 45 to 50% with mild decreased function.  He had no valvular abnormalities.  This may be related to his atrial fibrillation.

## 2024-07-17 NOTE — Assessment & Plan Note (Signed)
 History of CAD status post ramus branch and RCA stenting by myself back in 1997.  I recatheterized him 12/22/2003 revealing patent stents with a 60% mid LAD lesion.  He had a negative Myoview  stress test 08/04/2010 and again 05/07/2020 which showed lateral scar without ischemia unchanged from his prior Myoview .  He denies chest pain.

## 2024-07-17 NOTE — Patient Instructions (Signed)
 Medication Instructions:  No changes *If you need a refill on your cardiac medications before your next appointment, please call your pharmacy*  Lab Work: none If you have labs (blood work) drawn today and your tests are completely normal, you will receive your results only by: MyChart Message (if you have MyChart) OR A paper copy in the mail If you have any lab test that is abnormal or we need to change your treatment, we will call you to review the results.  Testing/Procedures: none  Follow-Up: At [facility], you and your health needs are our priority.  As part of our continuing mission to provide you with exceptional heart care, our providers are all part of one team.  This team includes your primary Cardiologist (physician) and Advanced Practice Providers or APPs (Physician Assistants and Nurse Practitioners) who all work together to provide you with the care you need, when you need it.  Your next appointment:   6 month(s)  Provider:   One of our Advanced Practice Providers (APPs): Morse Clause, PA-C  Lamarr Satterfield, NP Miriam Shams, NP  Olivia Pavy, PA-C Josefa Beauvais, NP  Leontine Salen, PA-C Orren Fabry, PA-C  [place], PA-C Ernest Dick, NP  Damien Braver, NP Jon Hails, PA-C  Waddell Donath, PA-C    Dayna Dunn, PA-C  Scott Weaver, PA-C Lum Louis, NP Katlyn West, NP Callie Goodrich, PA-C  Evan Williams, PA-C Sheng Haley, PA-C  Xika Zhao, NP Kathleen Johnson, PA-C   Then, Dorn Lesches, MD will plan to see you again in 12 month(s).    We recommend signing up for the patient portal called MyChart.  Sign up information is provided on this After Visit Summary.  MyChart is used to connect with patients for Virtual Visits (Telemedicine).  Patients are able to view lab/test results, encounter notes, upcoming appointments, etc.  Non-urgent messages can be sent to your provider as well.   To learn more about what you can do with MyChart, go to  [email or web address].

## 2024-07-17 NOTE — Progress Notes (Signed)
 07/17/2024 Donald Todd   02-Nov-1929  [id number]  Primary Physician Patient, No Pcp Per Primary Cardiologist: Dorn JINNY Lesches MD FACP, [place], [place], FSCAI  HPI:  Donald Todd is a 89 y.o.    thin-appearing widowed (wife of 66 years passed away 03-23-2024) Caucasian male, father of 5, grandfather to 7 grandchildren, who I last  saw in the office 12/30/2022.  He is accompanied by his son Donald Todd today.  He lives at [facility] in assisted care.  He has a history of RCA and ramus branch stenting by myself back in 1997. I catheterized him, December 22, 2003, revealing patent stent with a 60% mid LAD lesion, normal LV function. He did have a 40% ostial right renal artery stenosis with a known left nephrectomy. His last Myoview , performed August 04, 2010, revealed lateral scar, and catheterization performed several weeks prior to that revealed patent stents with noncritical CAD. He has remained asymptomatic. His last .Myoview  stress test performed 02/15/13 moderate sized lateral wall scar consistent with his anatomy. He saw Herlene Canterbury in the office 04/03/17 after a syncopal episode that occurred while he was bending over and running up. He's had a workup including an MRI of his brain which is unremarkable. He did injure his left shoulder and had this surgically addressed.   Because of some atypical chest pain I perform Myoview  stress testing on him 05/07/2020 that showed lateral scar without ischemia unchanged from prior Myoview  stress test performed in 2014.SABRA  His pain is somewhat atypical.   Unfortunately, he was diagnosed with cancer again.  He originally was diagnosed with left renal cell cancer in early 2000's status post left nephrectomy in 2005.  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  He has had radiation therapy.   Since I saw him in the office 7 months ago he did develop new onset A-fib in March thought to be related to  norovirus.  He underwent successful DC cardioversion by Dr. Lonni 05/01/2024 and quickly reverted back to A-fib.  A Zio patch has shown A-fib with 100% burden.  Echo performed in March showed an EF of 45 to 50% with mild global hypokinesia.  He currently is on Eliquis  oral anticoagulation for stroke prophylaxis.  He denies chest pain or shortness of breath.   Current Meds  Medication Sig   acetaminophen  (TYLENOL ) 325 MG tablet Take 1-2 tablets (325-650 mg total) by mouth every 4 (four) hours as needed for mild pain.   amLODipine  (NORVASC ) 2.5 MG tablet Take 2.5 mg by mouth daily.   apixaban  (ELIQUIS ) 5 MG TABS tablet Take 1 tablet (5 mg total) by mouth 2 (two) times daily.   atorvastatin  (LIPITOR) 20 MG tablet Take 20 mg by mouth every evening.   calcium  carbonate (TUMS - DOSED IN MG ELEMENTAL CALCIUM ) 500 MG chewable tablet Chew 1,000 mg by mouth daily as needed for indigestion or heartburn.   docusate sodium  (COLACE) 100 MG capsule Take 1 capsule (100 mg total) by mouth every 12 (twelve) hours.   fluticasone  (FLONASE ) 50 MCG/ACT nasal spray Place 2 sprays into both nostrils daily.   loratadine  (CLARITIN ) 10 MG tablet Take 10 mg by mouth every evening.   meclizine  (ANTIVERT ) 25 MG tablet Take 25 mg by mouth every 12 (twelve) hours as needed for dizziness.   metoprolol  succinate (TOPROL -XL) 50 MG 24 hr tablet Take 100 mg in A.M. and 50  mg in P.M.   Multiple Vitamins-Minerals (PRESERVISION AREDS 2) CAPS Take 1 capsule by mouth 2 (two) times daily.   Nutritional Supplements (ENSURE ORIGINAL) LIQD Take 1 Container by mouth 2 (two) times daily between meals.   polyethylene glycol (MIRALAX  / GLYCOLAX ) 17 g packet Take 17 g by mouth daily.   QUEtiapine  (SEROQUEL  XR) 50 MG TB24 24 hr tablet Take 100 mg by mouth at bedtime.   tamsulosin  (FLOMAX ) 0.4 MG CAPS capsule Take 1 capsule (0.4 mg total) by mouth daily after supper.     Allergies  Allergen Reactions   Tape Hives    Surgical tape -  causes blisters   Hydrocodone  Other (See Comments)    Tremors    Iohexol Other (See Comments)     Desc: PT DOES RECALL REACTION JUST SAYS IT WAS A LONG TIME AGO FOR KIDNEY X-RAYS AND HE DIDN'T TOLERATE IT WELL-ARS 05/02/09-NOTE E-CHART STATES A HOT FEELING IS HIS REACTION, BUT HE CAN'T CONFIRM THAT WAS ALL    Roxicodone  [Oxycodone ] Other (See Comments)    Tremors     Social History   Socioeconomic History   Marital status: Married    Spouse name: Not on file   Number of children: 5   Years of education: 12   Highest education level: Not on file  Occupational History   Occupation: Retired    Comment: Librarian, academic, Presenter, 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  Tobacco Use   Smoking status: Former    Current packs/day: 0.00    Average packs/day: 0.3 packs/day for 13.0 years (3.3 ttl pk-yrs)    Types: Cigarettes, Cigars    Start date: 12/13/1950    Quit date: 12/14/1963    Years since quitting: 60.6   Smokeless tobacco: Never   Tobacco comments:    Former smoker 04/24/24  Vaping Use   Vaping status: Never Used  Substance and Sexual Activity   Alcohol use: No   Drug use: No   Sexual activity: Not Currently  Other Topics Concern   Not on file  Social History Narrative   Lives at home w/ his wife   Right-handed   Caffeine : 2 cups of coffee per day, rare soft drink   Social Drivers of Corporate investment banker Strain: Not on file  Food Insecurity: No Food Insecurity (02/09/2024)   Hunger Vital Sign    Worried About Running Out of Food in the Last Year: Never true    Ran Out of Food in the Last Year: Never true  Transportation Needs: No Transportation Needs (02/09/2024)   PRAPARE - Administrator, Civil Service (Medical): No    Lack of Transportation (Non-Medical): No  Physical Activity: Not on file  Stress: Not on file  Social Connections: Moderately Integrated (02/09/2024)   Social Connection and Isolation Panel    Frequency of Communication with Friends and Family: Three times a week    Frequency  of Social Gatherings with Friends and Family: Three times a week    Attends Religious Services: 1 to 4 times per year    Active Member of Clubs or Organizations: No    Attends Banker Meetings: Never    Marital Status: Married  Catering manager Violence: Not At Risk (02/09/2024)   Humiliation, Afraid, Rape, and Kick questionnaire    Fear of Current or Ex-Partner: No    Emotionally Abused: No    Physically Abused: No    Sexually Abused: No     Review of Systems: General: negative for chills,  fever, night sweats or weight changes.  Cardiovascular: negative for chest pain, dyspnea on exertion, edema, orthopnea, palpitations, paroxysmal nocturnal dyspnea or shortness of breath Dermatological: negative for rash Respiratory: negative for cough or wheezing Urologic: negative for hematuria Abdominal: negative for nausea, vomiting, diarrhea, bright red blood per rectum, melena, or hematemesis Neurologic: negative for visual changes, syncope, or dizziness All other systems reviewed and are otherwise negative except as noted above.    Blood pressure 122/80, pulse 64, height 5' 7 (1.702 m), weight 143 lb 6.4 oz (65 kg), SpO2 98%.  General appearance: alert and no distress Neck: no adenopathy, no carotid bruit, no JVD, supple, symmetrical, trachea midline, and thyroid not enlarged, symmetric, no tenderness/mass/nodules Lungs: clear to auscultation bilaterally Heart: irregularly irregular rhythm Extremities: extremities normal, atraumatic, no cyanosis or edema Pulses: 2+ and symmetric Skin: Skin color, texture, turgor normal. No rashes or lesions Neurologic: Grossly normal  EKG not performed today      ASSESSMENT AND PLAN:   Essential hypertension History of essential hypertension with blood pressure measured today at 122/80.  He is on low-dose amlodipine , and metoprolol .  CAD S/P percutaneous coronary angioplasty History of CAD status post ramus branch and RCA stenting by  myself back in 1997.  I recatheterized him 12/22/2003 revealing patent stents with a 60% mid LAD lesion.  He had a negative Myoview  stress test 08/04/2010 and again 05/07/2020 which showed lateral scar without ischemia unchanged from his prior Myoview .  He denies chest pain.  Dyslipidemia History of dyslipidemia on statin therapy with lipid profile performed 12/12/2022 Miel Wisener total cholesterol 155, LDL 55 and HDL of 82.  A-fib [facility]) History of new onset A-fib back in March status post DC cardioversion by Dr. Lonni 05/01/2024 quickly reverting back to A-fib.  Recent Zio patch showed A-fib/flutter with 100% burden.  Was decided to settle for rate control.  He is on Eliquis  and is asymptomatic from this.  LV dysfunction EF by recent 2D echo performed 02/10/2024 was 45 to 50% with mild decreased function.  He had no valvular abnormalities.  This may be related to his atrial fibrillation.     Dorn DOROTHA Lesches MD FACP,FACC,FAHA, [facility] 07/17/2024 2:43 PM

## 2024-07-17 NOTE — Assessment & Plan Note (Signed)
 History of new onset A-fib back in March status post DC cardioversion by Dr. Lonni 05/01/2024 quickly reverting back to A-fib.  Recent Zio patch showed A-fib/flutter with 100% burden.  Was decided to settle for rate control.  He is on Eliquis  and is asymptomatic from this.

## 2024-07-17 NOTE — Assessment & Plan Note (Signed)
 History of dyslipidemia on statin therapy with lipid profile performed 12/12/2022 Donald Todd total cholesterol 155, LDL 55 and HDL of 82.

## 2024-07-17 NOTE — Assessment & Plan Note (Signed)
 History of essential hypertension with blood pressure measured today at 122/80.  He is on low-dose amlodipine , and metoprolol .

## 2024-07-18 DIAGNOSIS — R4789 Other speech disturbances: Secondary | ICD-10-CM | POA: Diagnosis not present

## 2024-07-18 DIAGNOSIS — M62521 Muscle wasting and atrophy, not elsewhere classified, right upper arm: Secondary | ICD-10-CM | POA: Diagnosis not present

## 2024-07-18 DIAGNOSIS — R2681 Unsteadiness on feet: Secondary | ICD-10-CM | POA: Diagnosis not present

## 2024-07-18 DIAGNOSIS — R488 Other symbolic dysfunctions: Secondary | ICD-10-CM | POA: Diagnosis not present

## 2024-07-18 DIAGNOSIS — R4185 Anosognosia: Secondary | ICD-10-CM | POA: Diagnosis not present

## 2024-07-18 DIAGNOSIS — R1310 Dysphagia, unspecified: Secondary | ICD-10-CM | POA: Diagnosis not present

## 2024-07-18 DIAGNOSIS — R1312 Dysphagia, oropharyngeal phase: Secondary | ICD-10-CM | POA: Diagnosis not present

## 2024-07-19 DIAGNOSIS — M62561 Muscle wasting and atrophy, not elsewhere classified, right lower leg: Secondary | ICD-10-CM | POA: Diagnosis not present

## 2024-07-19 DIAGNOSIS — M62552 Muscle wasting and atrophy, not elsewhere classified, left thigh: Secondary | ICD-10-CM | POA: Diagnosis not present

## 2024-07-19 DIAGNOSIS — R2689 Other abnormalities of gait and mobility: Secondary | ICD-10-CM | POA: Diagnosis not present

## 2024-07-19 DIAGNOSIS — M62562 Muscle wasting and atrophy, not elsewhere classified, left lower leg: Secondary | ICD-10-CM | POA: Diagnosis not present

## 2024-07-19 DIAGNOSIS — M62551 Muscle wasting and atrophy, not elsewhere classified, right thigh: Secondary | ICD-10-CM | POA: Diagnosis not present

## 2024-07-21 ENCOUNTER — Emergency Department (HOSPITAL_BASED_OUTPATIENT_CLINIC_OR_DEPARTMENT_OTHER)
Admission: EM | Admit: 2024-07-21 | Discharge: 2024-07-21 | Disposition: A | Discharge status: Home / Self Care | Attending: Emergency Medicine | Admitting: Emergency Medicine

## 2024-07-21 ENCOUNTER — Other Ambulatory Visit: Payer: Self-pay

## 2024-07-21 DIAGNOSIS — Z7901 Long term (current) use of anticoagulants: Secondary | ICD-10-CM | POA: Diagnosis not present

## 2024-07-21 DIAGNOSIS — H9222 Otorrhagia, left ear: Secondary | ICD-10-CM | POA: Diagnosis not present

## 2024-07-21 DIAGNOSIS — Z79899 Other long term (current) drug therapy: Secondary | ICD-10-CM | POA: Diagnosis not present

## 2024-07-21 DIAGNOSIS — R58 Hemorrhage, not elsewhere classified: Secondary | ICD-10-CM

## 2024-07-21 DIAGNOSIS — I4891 Unspecified atrial fibrillation: Secondary | ICD-10-CM | POA: Insufficient documentation

## 2024-07-21 DIAGNOSIS — L7622 Postprocedural hemorrhage and hematoma of skin and subcutaneous tissue following other procedure: Secondary | ICD-10-CM | POA: Diagnosis not present

## 2024-07-21 NOTE — ED Provider Notes (Signed)
 [place] EMERGENCY DEPARTMENT AT [facility] Provider Note   CSN: [id number] Arrival date & time: 07/21/24  1213     Patient presents with: Ear Drainage   GUHAN BRUINGTON is a 89 y.o. male.   Patient presents to the emergency department today for evaluation of bleeding from the left ear canal.  Patient has a history of atrial fibrillation and is on Eliquis .  He has had wax in his ears.  They recently applied drops to soften the wax per family.  Per paperwork sent with patient from [facility], it bled twice today.       Prior to Admission medications   Medication Sig Start Date End Date Taking? Authorizing Provider  acetaminophen  (TYLENOL ) 325 MG tablet Take 1-2 tablets (325-650 mg total) by mouth every 4 (four) hours as needed for mild pain. 02/21/23   Love, Sharlet RAMAN, PA-C  amLODipine  (NORVASC ) 2.5 MG tablet Take 2.5 mg by mouth daily. 07/03/24   [provider]  amLODipine  (NORVASC ) 5 MG tablet Take 5 mg by mouth daily.    [provider]  apixaban  (ELIQUIS ) 5 MG TABS tablet Take 1 tablet (5 mg total) by mouth 2 (two) times daily. 02/15/24   Drusilla Sabas RAMAN, MD  atorvastatin  (LIPITOR) 20 MG tablet Take 20 mg by mouth every evening.    [provider]  calcium  carbonate (TUMS - DOSED IN MG ELEMENTAL CALCIUM ) 500 MG chewable tablet Chew 1,000 mg by mouth daily as needed for indigestion or heartburn.    [provider]  docusate sodium  (COLACE) 100 MG capsule Take 1 capsule (100 mg total) by mouth every 12 (twelve) hours. 03/10/21   Raford Lenis, MD  fluticasone  (FLONASE ) 50 MCG/ACT nasal spray Place 2 sprays into both nostrils daily. 07/03/24 07/03/25  Fenton, Clint R, PA  ipratropium (ATROVENT ) 0.03 % nasal spray Place 1-2 sprays into both nostrils 2 (two) times daily as needed (allergies). Patient not taking: Reported on 07/17/2024 05/29/20   [provider]  loperamide (IMODIUM A-D) 2 MG tablet Take 2 mg by mouth every 6 (six)  hours as needed for diarrhea or loose stools. Patient not taking: Reported on 07/17/2024    [provider]  loratadine  (CLARITIN ) 10 MG tablet Take 10 mg by mouth every evening. 09/29/21   [provider]  meclizine  (ANTIVERT ) 25 MG tablet Take 25 mg by mouth every 12 (twelve) hours as needed for dizziness.    [provider]  metoprolol  succinate (TOPROL -XL) 50 MG 24 hr tablet Take 100 mg in A.M. and 50 mg in P.M. 06/12/24   Fenton, Clint R, PA  Multiple Vitamins-Minerals (PRESERVISION AREDS 2) CAPS Take 1 capsule by mouth 2 (two) times daily.    [provider]  nitroGLYCERIN  (NITROSTAT ) 0.4 MG SL tablet Place 1 tablet (0.4 mg total) under the tongue every 5 (five) minutes as needed for chest pain (Max 3 doeses). Patient not taking: Reported on 07/17/2024 02/15/24   Drusilla Sabas RAMAN, MD  Nutritional Supplements (ENSURE ORIGINAL) LIQD Take 1 Container by mouth 2 (two) times daily between meals.    [provider]  Pedialyte (PEDIALYTE) SOLN Take 236 mLs by mouth 3 (three) times daily as needed (dehydration secondary to nausea). Patient not taking: Reported on 07/17/2024    [provider]  polyethylene glycol (MIRALAX  / GLYCOLAX ) 17 g packet Take 17 g by mouth daily. 03/10/21   Raford Lenis, MD  QUEtiapine  (SEROQUEL  XR) 50 MG TB24 24 hr tablet Take 100 mg  by mouth at bedtime. 07/01/24   [provider]  QUEtiapine  (SEROQUEL ) 25 MG tablet Take 1 tablet (25 mg total) by mouth at bedtime. 02/21/23   Love, Sharlet RAMAN, PA-C  tamsulosin  (FLOMAX ) 0.4 MG CAPS capsule Take 1 capsule (0.4 mg total) by mouth daily after supper. 02/15/24   Drusilla Sabas RAMAN, MD    Allergies: Tape, Hydrocodone , Iohexol, and Roxicodone  [oxycodone ]    Review of Systems  Updated Vital Signs BP 131/67 (BP Location: Left Arm)   Pulse 70   Temp 98.9 F (37.2 C)   Resp 20   SpO2 97%   Physical Exam Vitals and nursing note reviewed.  Constitutional:      Appearance: He is  well-developed.  HENT:     Head: Normocephalic and atraumatic.     Ears:     Comments: There is some pooling of venous appearing blood in the left external auditory meatus.  There is blood in the ear canal, unable to visualize TM or see specific area of bleeding. Eyes:     Conjunctiva/sclera: Conjunctivae normal.  Pulmonary:     Effort: No respiratory distress.  Musculoskeletal:     Cervical back: Normal range of motion and neck supple.  Skin:    General: Skin is warm and dry.  Neurological:     Mental Status: He is alert.     (all labs ordered are listed, but only abnormal results are displayed) Labs Reviewed - No data to display  EKG: None  Radiology: No results found.   Procedures   Medications Ordered in the ED - No data to display  ED Course  Patient seen and examined. History obtained directly from patient and family member at bedside.  Labs/EKG: None ordered  Imaging: None ordered  Medications/Fluids: None ordered  Most recent vital signs reviewed and are as follows: BP 131/67 (BP Location: Left Arm)   Pulse 70   Temp 98.9 F (37.2 C)   Resp 20   SpO2 97%   Initial impression: Will clean ear canal and monitor amount of bleeding.  Consider trial of ear wick to see if this helps clot blood.  1:12 PM Inserted ear wick, expanded with a few drops of saline, will recheck.   1:27 PM Ear wick in place.  No significant bleeding noted.  Plan: Skip Eliquis  for next 24 hours (dose tonight and tomorrow morning), remove ear wick on Tuesday morning.  Return with any worsening symptoms.  Discussed plan with patient and son at bedside, they agree and are comfortable with this.                                  Medical Decision Making  Bleeding from ear canal, likely secondary to trauma from recent measures to soften earwax.  Bleeding was very mild.  Ear wick was placed and will be left in place until Tuesday morning.  Will have him hold Eliquis , which is taken  due to atrial fibrillation, over the next 24 hours and then resume.  Vitals reassuring.  Patient looks well.  They will return with any worsening symptoms.     Final diagnoses:  Bleeding    ED Discharge Orders     None          Desiderio Chew, DEVONNA 07/21/24 1328    Tonia Chew, MD 07/21/24 1640

## 2024-07-21 NOTE — ED Triage Notes (Signed)
 Pt POV with family from [facility], family contacted by facility, blood noted in pts L ear, cleaned by nurse and bleeding continued, on Eliquis , advised to come to ED. No active bleeding at this time.

## 2024-07-21 NOTE — Discharge Instructions (Addendum)
 Skip Eliquis  for the next 24 hours (do not take tonight or tomorrow morning) and then resume tomorrow evening.  Remove the ear wick on Tuesday morning.  If bleeding resumes, return to the emergency department.  You may have a small amount of slow bleeding or saline mixed with blood that needs to be dabbed away, but please return with any heavier bleeding.

## 2024-07-22 DIAGNOSIS — M62521 Muscle wasting and atrophy, not elsewhere classified, right upper arm: Secondary | ICD-10-CM | POA: Diagnosis not present

## 2024-07-22 DIAGNOSIS — F411 Generalized anxiety disorder: Secondary | ICD-10-CM | POA: Diagnosis not present

## 2024-07-22 DIAGNOSIS — R1310 Dysphagia, unspecified: Secondary | ICD-10-CM | POA: Diagnosis not present

## 2024-07-22 DIAGNOSIS — R441 Visual hallucinations: Secondary | ICD-10-CM | POA: Diagnosis not present

## 2024-07-22 DIAGNOSIS — R1312 Dysphagia, oropharyngeal phase: Secondary | ICD-10-CM | POA: Diagnosis not present

## 2024-07-22 DIAGNOSIS — F331 Major depressive disorder, recurrent, moderate: Secondary | ICD-10-CM | POA: Diagnosis not present

## 2024-07-22 DIAGNOSIS — G8929 Other chronic pain: Secondary | ICD-10-CM | POA: Diagnosis not present

## 2024-07-22 DIAGNOSIS — R2681 Unsteadiness on feet: Secondary | ICD-10-CM | POA: Diagnosis not present

## 2024-07-22 DIAGNOSIS — R4185 Anosognosia: Secondary | ICD-10-CM | POA: Diagnosis not present

## 2024-07-22 DIAGNOSIS — R4789 Other speech disturbances: Secondary | ICD-10-CM | POA: Diagnosis not present

## 2024-07-22 DIAGNOSIS — G3184 Mild cognitive impairment, so stated: Secondary | ICD-10-CM | POA: Diagnosis not present

## 2024-07-22 DIAGNOSIS — R488 Other symbolic dysfunctions: Secondary | ICD-10-CM | POA: Diagnosis not present

## 2024-07-23 DIAGNOSIS — M62562 Muscle wasting and atrophy, not elsewhere classified, left lower leg: Secondary | ICD-10-CM | POA: Diagnosis not present

## 2024-07-23 DIAGNOSIS — M62552 Muscle wasting and atrophy, not elsewhere classified, left thigh: Secondary | ICD-10-CM | POA: Diagnosis not present

## 2024-07-23 DIAGNOSIS — M62561 Muscle wasting and atrophy, not elsewhere classified, right lower leg: Secondary | ICD-10-CM | POA: Diagnosis not present

## 2024-07-23 DIAGNOSIS — R488 Other symbolic dysfunctions: Secondary | ICD-10-CM | POA: Diagnosis not present

## 2024-07-23 DIAGNOSIS — M62521 Muscle wasting and atrophy, not elsewhere classified, right upper arm: Secondary | ICD-10-CM | POA: Diagnosis not present

## 2024-07-23 DIAGNOSIS — M62551 Muscle wasting and atrophy, not elsewhere classified, right thigh: Secondary | ICD-10-CM | POA: Diagnosis not present

## 2024-07-23 DIAGNOSIS — R2681 Unsteadiness on feet: Secondary | ICD-10-CM | POA: Diagnosis not present

## 2024-07-23 DIAGNOSIS — R2689 Other abnormalities of gait and mobility: Secondary | ICD-10-CM | POA: Diagnosis not present

## 2024-07-24 DIAGNOSIS — R1312 Dysphagia, oropharyngeal phase: Secondary | ICD-10-CM | POA: Diagnosis not present

## 2024-07-24 DIAGNOSIS — R4185 Anosognosia: Secondary | ICD-10-CM | POA: Diagnosis not present

## 2024-07-24 DIAGNOSIS — R4789 Other speech disturbances: Secondary | ICD-10-CM | POA: Diagnosis not present

## 2024-07-24 DIAGNOSIS — R488 Other symbolic dysfunctions: Secondary | ICD-10-CM | POA: Diagnosis not present

## 2024-07-24 DIAGNOSIS — R1310 Dysphagia, unspecified: Secondary | ICD-10-CM | POA: Diagnosis not present

## 2024-07-25 DIAGNOSIS — R2681 Unsteadiness on feet: Secondary | ICD-10-CM | POA: Diagnosis not present

## 2024-07-25 DIAGNOSIS — R2689 Other abnormalities of gait and mobility: Secondary | ICD-10-CM | POA: Diagnosis not present

## 2024-07-25 DIAGNOSIS — M62551 Muscle wasting and atrophy, not elsewhere classified, right thigh: Secondary | ICD-10-CM | POA: Diagnosis not present

## 2024-07-25 DIAGNOSIS — M62552 Muscle wasting and atrophy, not elsewhere classified, left thigh: Secondary | ICD-10-CM | POA: Diagnosis not present

## 2024-07-25 DIAGNOSIS — M62561 Muscle wasting and atrophy, not elsewhere classified, right lower leg: Secondary | ICD-10-CM | POA: Diagnosis not present

## 2024-07-25 DIAGNOSIS — M62562 Muscle wasting and atrophy, not elsewhere classified, left lower leg: Secondary | ICD-10-CM | POA: Diagnosis not present

## 2024-07-25 DIAGNOSIS — R488 Other symbolic dysfunctions: Secondary | ICD-10-CM | POA: Diagnosis not present

## 2024-07-25 DIAGNOSIS — M62521 Muscle wasting and atrophy, not elsewhere classified, right upper arm: Secondary | ICD-10-CM | POA: Diagnosis not present

## 2024-07-29 DIAGNOSIS — R1310 Dysphagia, unspecified: Secondary | ICD-10-CM | POA: Diagnosis not present

## 2024-07-29 DIAGNOSIS — R2681 Unsteadiness on feet: Secondary | ICD-10-CM | POA: Diagnosis not present

## 2024-07-29 DIAGNOSIS — R1312 Dysphagia, oropharyngeal phase: Secondary | ICD-10-CM | POA: Diagnosis not present

## 2024-07-29 DIAGNOSIS — R4185 Anosognosia: Secondary | ICD-10-CM | POA: Diagnosis not present

## 2024-07-29 DIAGNOSIS — M62521 Muscle wasting and atrophy, not elsewhere classified, right upper arm: Secondary | ICD-10-CM | POA: Diagnosis not present

## 2024-07-29 DIAGNOSIS — R488 Other symbolic dysfunctions: Secondary | ICD-10-CM | POA: Diagnosis not present

## 2024-07-29 DIAGNOSIS — R4789 Other speech disturbances: Secondary | ICD-10-CM | POA: Diagnosis not present

## 2024-07-30 DIAGNOSIS — R488 Other symbolic dysfunctions: Secondary | ICD-10-CM | POA: Diagnosis not present

## 2024-07-30 DIAGNOSIS — M62521 Muscle wasting and atrophy, not elsewhere classified, right upper arm: Secondary | ICD-10-CM | POA: Diagnosis not present

## 2024-07-30 DIAGNOSIS — R2681 Unsteadiness on feet: Secondary | ICD-10-CM | POA: Diagnosis not present

## 2024-07-31 DIAGNOSIS — R4789 Other speech disturbances: Secondary | ICD-10-CM | POA: Diagnosis not present

## 2024-07-31 DIAGNOSIS — R1312 Dysphagia, oropharyngeal phase: Secondary | ICD-10-CM | POA: Diagnosis not present

## 2024-07-31 DIAGNOSIS — M62521 Muscle wasting and atrophy, not elsewhere classified, right upper arm: Secondary | ICD-10-CM | POA: Diagnosis not present

## 2024-07-31 DIAGNOSIS — R488 Other symbolic dysfunctions: Secondary | ICD-10-CM | POA: Diagnosis not present

## 2024-07-31 DIAGNOSIS — R2681 Unsteadiness on feet: Secondary | ICD-10-CM | POA: Diagnosis not present

## 2024-07-31 DIAGNOSIS — R1310 Dysphagia, unspecified: Secondary | ICD-10-CM | POA: Diagnosis not present

## 2024-07-31 DIAGNOSIS — R4185 Anosognosia: Secondary | ICD-10-CM | POA: Diagnosis not present

## 2024-08-01 DIAGNOSIS — R2689 Other abnormalities of gait and mobility: Secondary | ICD-10-CM | POA: Diagnosis not present

## 2024-08-01 DIAGNOSIS — H6122 Impacted cerumen, left ear: Secondary | ICD-10-CM | POA: Diagnosis not present

## 2024-08-01 DIAGNOSIS — M62551 Muscle wasting and atrophy, not elsewhere classified, right thigh: Secondary | ICD-10-CM | POA: Diagnosis not present

## 2024-08-01 DIAGNOSIS — M62552 Muscle wasting and atrophy, not elsewhere classified, left thigh: Secondary | ICD-10-CM | POA: Diagnosis not present

## 2024-08-01 DIAGNOSIS — H9222 Otorrhagia, left ear: Secondary | ICD-10-CM | POA: Diagnosis not present

## 2024-08-01 DIAGNOSIS — Z01118 Encounter for examination of ears and hearing with other abnormal findings: Secondary | ICD-10-CM | POA: Diagnosis not present

## 2024-08-01 DIAGNOSIS — R42 Dizziness and giddiness: Secondary | ICD-10-CM | POA: Diagnosis not present

## 2024-08-01 DIAGNOSIS — M62562 Muscle wasting and atrophy, not elsewhere classified, left lower leg: Secondary | ICD-10-CM | POA: Diagnosis not present

## 2024-08-01 DIAGNOSIS — M62561 Muscle wasting and atrophy, not elsewhere classified, right lower leg: Secondary | ICD-10-CM | POA: Diagnosis not present

## 2024-08-02 DIAGNOSIS — M62561 Muscle wasting and atrophy, not elsewhere classified, right lower leg: Secondary | ICD-10-CM | POA: Diagnosis not present

## 2024-08-02 DIAGNOSIS — M62552 Muscle wasting and atrophy, not elsewhere classified, left thigh: Secondary | ICD-10-CM | POA: Diagnosis not present

## 2024-08-02 DIAGNOSIS — R2681 Unsteadiness on feet: Secondary | ICD-10-CM | POA: Diagnosis not present

## 2024-08-02 DIAGNOSIS — M62521 Muscle wasting and atrophy, not elsewhere classified, right upper arm: Secondary | ICD-10-CM | POA: Diagnosis not present

## 2024-08-02 DIAGNOSIS — R2689 Other abnormalities of gait and mobility: Secondary | ICD-10-CM | POA: Diagnosis not present

## 2024-08-02 DIAGNOSIS — M62562 Muscle wasting and atrophy, not elsewhere classified, left lower leg: Secondary | ICD-10-CM | POA: Diagnosis not present

## 2024-08-02 DIAGNOSIS — M62551 Muscle wasting and atrophy, not elsewhere classified, right thigh: Secondary | ICD-10-CM | POA: Diagnosis not present

## 2024-08-02 DIAGNOSIS — R488 Other symbolic dysfunctions: Secondary | ICD-10-CM | POA: Diagnosis not present

## 2024-08-04 DIAGNOSIS — E785 Hyperlipidemia, unspecified: Secondary | ICD-10-CM | POA: Diagnosis not present

## 2024-08-04 DIAGNOSIS — I10 Essential (primary) hypertension: Secondary | ICD-10-CM | POA: Diagnosis not present

## 2024-08-06 DIAGNOSIS — R2681 Unsteadiness on feet: Secondary | ICD-10-CM | POA: Diagnosis not present

## 2024-08-06 DIAGNOSIS — R2689 Other abnormalities of gait and mobility: Secondary | ICD-10-CM | POA: Diagnosis not present

## 2024-08-06 DIAGNOSIS — M62561 Muscle wasting and atrophy, not elsewhere classified, right lower leg: Secondary | ICD-10-CM | POA: Diagnosis not present

## 2024-08-06 DIAGNOSIS — R4185 Anosognosia: Secondary | ICD-10-CM | POA: Diagnosis not present

## 2024-08-06 DIAGNOSIS — R1312 Dysphagia, oropharyngeal phase: Secondary | ICD-10-CM | POA: Diagnosis not present

## 2024-08-06 DIAGNOSIS — R4789 Other speech disturbances: Secondary | ICD-10-CM | POA: Diagnosis not present

## 2024-08-06 DIAGNOSIS — R488 Other symbolic dysfunctions: Secondary | ICD-10-CM | POA: Diagnosis not present

## 2024-08-06 DIAGNOSIS — M62552 Muscle wasting and atrophy, not elsewhere classified, left thigh: Secondary | ICD-10-CM | POA: Diagnosis not present

## 2024-08-06 DIAGNOSIS — M62521 Muscle wasting and atrophy, not elsewhere classified, right upper arm: Secondary | ICD-10-CM | POA: Diagnosis not present

## 2024-08-06 DIAGNOSIS — M62551 Muscle wasting and atrophy, not elsewhere classified, right thigh: Secondary | ICD-10-CM | POA: Diagnosis not present

## 2024-08-06 DIAGNOSIS — R1310 Dysphagia, unspecified: Secondary | ICD-10-CM | POA: Diagnosis not present

## 2024-08-06 DIAGNOSIS — M62562 Muscle wasting and atrophy, not elsewhere classified, left lower leg: Secondary | ICD-10-CM | POA: Diagnosis not present

## 2024-08-07 DIAGNOSIS — R2681 Unsteadiness on feet: Secondary | ICD-10-CM | POA: Diagnosis not present

## 2024-08-07 DIAGNOSIS — M62521 Muscle wasting and atrophy, not elsewhere classified, right upper arm: Secondary | ICD-10-CM | POA: Diagnosis not present

## 2024-08-07 DIAGNOSIS — R488 Other symbolic dysfunctions: Secondary | ICD-10-CM | POA: Diagnosis not present

## 2024-08-08 DIAGNOSIS — M62552 Muscle wasting and atrophy, not elsewhere classified, left thigh: Secondary | ICD-10-CM | POA: Diagnosis not present

## 2024-08-08 DIAGNOSIS — R2681 Unsteadiness on feet: Secondary | ICD-10-CM | POA: Diagnosis not present

## 2024-08-08 DIAGNOSIS — M62562 Muscle wasting and atrophy, not elsewhere classified, left lower leg: Secondary | ICD-10-CM | POA: Diagnosis not present

## 2024-08-08 DIAGNOSIS — M62551 Muscle wasting and atrophy, not elsewhere classified, right thigh: Secondary | ICD-10-CM | POA: Diagnosis not present

## 2024-08-08 DIAGNOSIS — R488 Other symbolic dysfunctions: Secondary | ICD-10-CM | POA: Diagnosis not present

## 2024-08-08 DIAGNOSIS — R2689 Other abnormalities of gait and mobility: Secondary | ICD-10-CM | POA: Diagnosis not present

## 2024-08-08 DIAGNOSIS — M62561 Muscle wasting and atrophy, not elsewhere classified, right lower leg: Secondary | ICD-10-CM | POA: Diagnosis not present

## 2024-08-08 DIAGNOSIS — M62521 Muscle wasting and atrophy, not elsewhere classified, right upper arm: Secondary | ICD-10-CM | POA: Diagnosis not present

## 2024-08-09 DIAGNOSIS — H353211 Exudative age-related macular degeneration, right eye, with active choroidal neovascularization: Secondary | ICD-10-CM | POA: Diagnosis not present

## 2024-08-09 DIAGNOSIS — H35373 Puckering of macula, bilateral: Secondary | ICD-10-CM | POA: Diagnosis not present

## 2024-08-09 DIAGNOSIS — H3554 Dystrophies primarily involving the retinal pigment epithelium: Secondary | ICD-10-CM | POA: Diagnosis not present

## 2024-08-12 DIAGNOSIS — R2689 Other abnormalities of gait and mobility: Secondary | ICD-10-CM | POA: Diagnosis not present

## 2024-08-12 DIAGNOSIS — R488 Other symbolic dysfunctions: Secondary | ICD-10-CM | POA: Diagnosis not present

## 2024-08-12 DIAGNOSIS — R4789 Other speech disturbances: Secondary | ICD-10-CM | POA: Diagnosis not present

## 2024-08-12 DIAGNOSIS — M62562 Muscle wasting and atrophy, not elsewhere classified, left lower leg: Secondary | ICD-10-CM | POA: Diagnosis not present

## 2024-08-12 DIAGNOSIS — R1312 Dysphagia, oropharyngeal phase: Secondary | ICD-10-CM | POA: Diagnosis not present

## 2024-08-12 DIAGNOSIS — R4185 Anosognosia: Secondary | ICD-10-CM | POA: Diagnosis not present

## 2024-08-12 DIAGNOSIS — M62551 Muscle wasting and atrophy, not elsewhere classified, right thigh: Secondary | ICD-10-CM | POA: Diagnosis not present

## 2024-08-12 DIAGNOSIS — M62552 Muscle wasting and atrophy, not elsewhere classified, left thigh: Secondary | ICD-10-CM | POA: Diagnosis not present

## 2024-08-12 DIAGNOSIS — R1310 Dysphagia, unspecified: Secondary | ICD-10-CM | POA: Diagnosis not present

## 2024-08-12 DIAGNOSIS — M62561 Muscle wasting and atrophy, not elsewhere classified, right lower leg: Secondary | ICD-10-CM | POA: Diagnosis not present

## 2024-08-14 DIAGNOSIS — R488 Other symbolic dysfunctions: Secondary | ICD-10-CM | POA: Diagnosis not present

## 2024-08-14 DIAGNOSIS — M159 Polyosteoarthritis, unspecified: Secondary | ICD-10-CM | POA: Diagnosis not present

## 2024-08-14 DIAGNOSIS — G8929 Other chronic pain: Secondary | ICD-10-CM | POA: Diagnosis not present

## 2024-08-14 DIAGNOSIS — M62521 Muscle wasting and atrophy, not elsewhere classified, right upper arm: Secondary | ICD-10-CM | POA: Diagnosis not present

## 2024-08-14 DIAGNOSIS — N1831 Chronic kidney disease, stage 3a: Secondary | ICD-10-CM | POA: Diagnosis not present

## 2024-08-14 DIAGNOSIS — M62552 Muscle wasting and atrophy, not elsewhere classified, left thigh: Secondary | ICD-10-CM | POA: Diagnosis not present

## 2024-08-14 DIAGNOSIS — R2681 Unsteadiness on feet: Secondary | ICD-10-CM | POA: Diagnosis not present

## 2024-08-14 DIAGNOSIS — M62561 Muscle wasting and atrophy, not elsewhere classified, right lower leg: Secondary | ICD-10-CM | POA: Diagnosis not present

## 2024-08-14 DIAGNOSIS — M62562 Muscle wasting and atrophy, not elsewhere classified, left lower leg: Secondary | ICD-10-CM | POA: Diagnosis not present

## 2024-08-14 DIAGNOSIS — R7303 Prediabetes: Secondary | ICD-10-CM | POA: Diagnosis not present

## 2024-08-14 DIAGNOSIS — R2689 Other abnormalities of gait and mobility: Secondary | ICD-10-CM | POA: Diagnosis not present

## 2024-08-14 DIAGNOSIS — H6122 Impacted cerumen, left ear: Secondary | ICD-10-CM | POA: Diagnosis not present

## 2024-08-14 DIAGNOSIS — M62551 Muscle wasting and atrophy, not elsewhere classified, right thigh: Secondary | ICD-10-CM | POA: Diagnosis not present

## 2024-08-15 DIAGNOSIS — R488 Other symbolic dysfunctions: Secondary | ICD-10-CM | POA: Diagnosis not present

## 2024-08-15 DIAGNOSIS — R2681 Unsteadiness on feet: Secondary | ICD-10-CM | POA: Diagnosis not present

## 2024-08-15 DIAGNOSIS — M62521 Muscle wasting and atrophy, not elsewhere classified, right upper arm: Secondary | ICD-10-CM | POA: Diagnosis not present

## 2024-08-16 DIAGNOSIS — R488 Other symbolic dysfunctions: Secondary | ICD-10-CM | POA: Diagnosis not present

## 2024-08-16 DIAGNOSIS — R1310 Dysphagia, unspecified: Secondary | ICD-10-CM | POA: Diagnosis not present

## 2024-08-16 DIAGNOSIS — M62562 Muscle wasting and atrophy, not elsewhere classified, left lower leg: Secondary | ICD-10-CM | POA: Diagnosis not present

## 2024-08-16 DIAGNOSIS — R4185 Anosognosia: Secondary | ICD-10-CM | POA: Diagnosis not present

## 2024-08-16 DIAGNOSIS — M79604 Pain in right leg: Secondary | ICD-10-CM | POA: Diagnosis not present

## 2024-08-16 DIAGNOSIS — M62552 Muscle wasting and atrophy, not elsewhere classified, left thigh: Secondary | ICD-10-CM | POA: Diagnosis not present

## 2024-08-16 DIAGNOSIS — M79675 Pain in left toe(s): Secondary | ICD-10-CM | POA: Diagnosis not present

## 2024-08-16 DIAGNOSIS — R4789 Other speech disturbances: Secondary | ICD-10-CM | POA: Diagnosis not present

## 2024-08-16 DIAGNOSIS — B351 Tinea unguium: Secondary | ICD-10-CM | POA: Diagnosis not present

## 2024-08-16 DIAGNOSIS — R1312 Dysphagia, oropharyngeal phase: Secondary | ICD-10-CM | POA: Diagnosis not present

## 2024-08-16 DIAGNOSIS — M62551 Muscle wasting and atrophy, not elsewhere classified, right thigh: Secondary | ICD-10-CM | POA: Diagnosis not present

## 2024-08-16 DIAGNOSIS — L84 Corns and callosities: Secondary | ICD-10-CM | POA: Diagnosis not present

## 2024-08-16 DIAGNOSIS — R2689 Other abnormalities of gait and mobility: Secondary | ICD-10-CM | POA: Diagnosis not present

## 2024-08-16 DIAGNOSIS — G609 Hereditary and idiopathic neuropathy, unspecified: Secondary | ICD-10-CM | POA: Diagnosis not present

## 2024-08-16 DIAGNOSIS — M2041 Other hammer toe(s) (acquired), right foot: Secondary | ICD-10-CM | POA: Diagnosis not present

## 2024-08-16 DIAGNOSIS — M62561 Muscle wasting and atrophy, not elsewhere classified, right lower leg: Secondary | ICD-10-CM | POA: Diagnosis not present

## 2024-08-18 ENCOUNTER — Emergency Department (HOSPITAL_COMMUNITY)
Admission: EM | Admit: 2024-08-18 | Discharge: 2024-08-18 | Disposition: A | Discharge status: Home / Self Care | Attending: Emergency Medicine | Admitting: Emergency Medicine

## 2024-08-18 ENCOUNTER — Emergency Department (HOSPITAL_COMMUNITY)

## 2024-08-18 ENCOUNTER — Other Ambulatory Visit: Payer: Self-pay

## 2024-08-18 ENCOUNTER — Encounter (HOSPITAL_COMMUNITY): Payer: Self-pay | Admitting: Emergency Medicine

## 2024-08-18 DIAGNOSIS — M7989 Other specified soft tissue disorders: Secondary | ICD-10-CM

## 2024-08-18 DIAGNOSIS — I4891 Unspecified atrial fibrillation: Secondary | ICD-10-CM | POA: Diagnosis not present

## 2024-08-18 DIAGNOSIS — I10 Essential (primary) hypertension: Secondary | ICD-10-CM | POA: Diagnosis not present

## 2024-08-18 DIAGNOSIS — Z7901 Long term (current) use of anticoagulants: Secondary | ICD-10-CM | POA: Insufficient documentation

## 2024-08-18 DIAGNOSIS — R2241 Localized swelling, mass and lump, right lower limb: Secondary | ICD-10-CM | POA: Insufficient documentation

## 2024-08-18 LAB — CBC WITH DIFFERENTIAL/PLATELET
Abs Immature Granulocytes: 0.01 K/uL (ref 0.00–0.07)
Basophils Absolute: 0 K/uL (ref 0.0–0.1)
Basophils Relative: 1 %
Eosinophils Absolute: 0.3 K/uL (ref 0.0–0.5)
Eosinophils Relative: 7 %
HCT: 41.2 % (ref 39.0–52.0)
Hemoglobin: 12.7 g/dL — ABNORMAL LOW (ref 13.0–17.0)
Immature Granulocytes: 0 %
Lymphocytes Relative: 26 %
Lymphs Abs: 1.3 K/uL (ref 0.7–4.0)
MCH: 30.2 pg (ref 26.0–34.0)
MCHC: 30.8 g/dL (ref 30.0–36.0)
MCV: 98.1 fL (ref 80.0–100.0)
Monocytes Absolute: 0.6 K/uL (ref 0.1–1.0)
Monocytes Relative: 11 %
Neutro Abs: 2.7 K/uL (ref 1.7–7.7)
Neutrophils Relative %: 55 %
Platelets: 125 K/uL — ABNORMAL LOW (ref 150–400)
RBC: 4.2 MIL/uL — ABNORMAL LOW (ref 4.22–5.81)
RDW: 13.5 % (ref 11.5–15.5)
WBC: 5 K/uL (ref 4.0–10.5)
nRBC: 0 % (ref 0.0–0.2)

## 2024-08-18 LAB — COMPREHENSIVE METABOLIC PANEL WITH GFR
ALT: 23 U/L (ref 0–44)
AST: 25 U/L (ref 15–41)
Albumin: 3.9 g/dL (ref 3.5–5.0)
Alkaline Phosphatase: 86 U/L (ref 38–126)
Anion gap: 12 (ref 5–15)
BUN: 25 mg/dL — ABNORMAL HIGH (ref 8–23)
CO2: 22 mmol/L (ref 22–32)
Calcium: 9.8 mg/dL (ref 8.9–10.3)
Chloride: 107 mmol/L (ref 98–111)
Creatinine, Ser: 1.41 mg/dL — ABNORMAL HIGH (ref 0.61–1.24)
GFR, Estimated: 46 mL/min — ABNORMAL LOW (ref >60)
Glucose, Bld: 122 mg/dL — ABNORMAL HIGH (ref 70–99)
Potassium: 4.6 mmol/L (ref 3.5–5.1)
Sodium: 141 mmol/L (ref 135–145)
Total Bilirubin: 0.7 mg/dL (ref 0.0–1.2)
Total Protein: 6 g/dL — ABNORMAL LOW (ref 6.5–8.1)

## 2024-08-18 NOTE — Discharge Instructions (Addendum)
 Fortunately, your ultrasound today does not show any evidence of a blood clot.  Continue your medications as previously prescribed.  Follow-up with your primary care provider.

## 2024-08-18 NOTE — ED Notes (Addendum)
 Son or daughter to come and pick up. Going to call back when estimated time of pick up will be.

## 2024-08-18 NOTE — ED Triage Notes (Signed)
 Pt bib EMS from [facility] independent [facility]. Pt had testing performed Thursday and report came back Saturday for suspicion of DVT in distal posterior tibial vein.  Pt on eliquis  with history of Afib.  Pt has swelling to right lower leg.  Pedal pulses strong.  Pt denies pain, N/V/D.  180/90

## 2024-08-18 NOTE — ED Notes (Addendum)
 Daughter Heron coming to pick up around 1 hour from now.

## 2024-08-18 NOTE — ED Provider Notes (Signed)
 [place] EMERGENCY DEPARTMENT AT [facility] Provider Note   CSN: [id number] Arrival date & time: 08/18/24  1557     Patient presents with: DVT   [name] is a 89 y.o. male.   HPI 89 year old male with a history of A-fib who is treated with Eliquis  presents with a DVT in the right leg.  He had a DVT ultrasound done a few days ago at his facility that showed a peroneal vein DVT.  Patient states he feels like his right leg has been swollen for about a year.  He denies any acute pain or worsening in the swelling.  He denies any chest pain or shortness of breath.  I talked to the nurse at his assisted living facility and unfortunately they just changed shifts but to his knowledge the patient was sent over for the positive DVT study.  Prior to Admission medications   Medication Sig Start Date End Date Taking? Authorizing Provider  acetaminophen  (TYLENOL ) 325 MG tablet Take 1-2 tablets (325-650 mg total) by mouth every 4 (four) hours as needed for mild pain. 02/21/23   Love, Sharlet RAMAN, PA-C  amLODipine  (NORVASC ) 2.5 MG tablet Take 2.5 mg by mouth daily. 07/03/24   [provider]  amLODipine  (NORVASC ) 5 MG tablet Take 5 mg by mouth daily.    [provider]  apixaban  (ELIQUIS ) 5 MG TABS tablet Take 1 tablet (5 mg total) by mouth 2 (two) times daily. 02/15/24   Drusilla Sabas RAMAN, MD  atorvastatin  (LIPITOR) 20 MG tablet Take 20 mg by mouth every evening.    [provider]  calcium  carbonate (TUMS - DOSED IN MG ELEMENTAL CALCIUM ) 500 MG chewable tablet Chew 1,000 mg by mouth daily as needed for indigestion or heartburn.    [provider]  docusate sodium  (COLACE) 100 MG capsule Take 1 capsule (100 mg total) by mouth every 12 (twelve) hours. 03/10/21   Raford Lenis, MD  fluticasone  (FLONASE ) 50 MCG/ACT nasal spray Place 2 sprays into both nostrils daily. 07/03/24 07/03/25  Fenton, Clint R, PA  ipratropium (ATROVENT ) 0.03 % nasal spray Place 1-2 sprays  into both nostrils 2 (two) times daily as needed (allergies). Patient not taking: Reported on 07/17/2024 05/29/20   [provider]  loperamide (IMODIUM A-D) 2 MG tablet Take 2 mg by mouth every 6 (six) hours as needed for diarrhea or loose stools. Patient not taking: Reported on 07/17/2024    [provider]  loratadine  (CLARITIN ) 10 MG tablet Take 10 mg by mouth every evening. 09/29/21   [provider]  meclizine  (ANTIVERT ) 25 MG tablet Take 25 mg by mouth every 12 (twelve) hours as needed for dizziness.    [provider]  metoprolol  succinate (TOPROL -XL) 50 MG 24 hr tablet Take 100 mg in A.M. and 50 mg in P.M. 06/12/24   Fenton, Clint R, PA  Multiple Vitamins-Minerals (PRESERVISION AREDS 2) CAPS Take 1 capsule by mouth 2 (two) times daily.    [provider]  nitroGLYCERIN  (NITROSTAT ) 0.4 MG SL tablet Place 1 tablet (0.4 mg total) under the tongue every 5 (five) minutes as needed for chest pain (Max 3 doeses). Patient not taking: Reported on 07/17/2024 02/15/24   Drusilla Sabas RAMAN, MD  Nutritional Supplements (ENSURE ORIGINAL) LIQD Take 1 Container by mouth 2 (two) times daily between meals.    [provider]  Pedialyte (PEDIALYTE) SOLN Take 236 mLs by mouth 3 (three) times daily as needed (dehydration secondary to nausea). Patient not  taking: Reported on 07/17/2024    [provider]  polyethylene glycol (MIRALAX  / GLYCOLAX ) 17 g packet Take 17 g by mouth daily. 03/10/21   Raford Lenis, MD  QUEtiapine  (SEROQUEL  XR) 50 MG TB24 24 hr tablet Take 100 mg by mouth at bedtime. 07/01/24   [provider]  QUEtiapine  (SEROQUEL ) 25 MG tablet Take 1 tablet (25 mg total) by mouth at bedtime. 02/21/23   Love, Sharlet RAMAN, PA-C  tamsulosin  (FLOMAX ) 0.4 MG CAPS capsule Take 1 capsule (0.4 mg total) by mouth daily after supper. 02/15/24   Drusilla Sabas RAMAN, MD    Allergies: Tape, Hydrocodone , Iohexol, and Roxicodone  [oxycodone ]    Review of Systems   Respiratory:  Negative for shortness of breath.   Cardiovascular:  Positive for leg swelling. Negative for chest pain.    Updated Vital Signs BP (!) 174/109 (BP Location: Right Arm)   Pulse 86   Temp 98.3 F (36.8 C) (Oral)   Resp 18   Ht 5' 7 (1.702 m)   Wt 65.8 kg   SpO2 96%   BMI 22.71 kg/m   Physical Exam Vitals and nursing note reviewed.  Constitutional:      Appearance: He is well-developed.  HENT:     Head: Normocephalic and atraumatic.  Cardiovascular:     Rate and Rhythm: Normal rate and regular rhythm.     Pulses:          Dorsalis pedis pulses are 2+ on the right side.     Heart sounds: Normal heart sounds.  Pulmonary:     Effort: Pulmonary effort is normal.  Musculoskeletal:     Comments: Right calf/lower leg is diffusely swollen compared to the left.  No pitting edema.  No obvious cellulitis.  No tenderness.  Skin:    General: Skin is warm and dry.  Neurological:     Mental Status: He is alert.     (all labs ordered are listed, but only abnormal results are displayed) Labs Reviewed  COMPREHENSIVE METABOLIC PANEL WITH GFR - Abnormal; Notable for the following components:      Result Value   Glucose, Bld 122 (*)    BUN 25 (*)    Creatinine, Ser 1.41 (*)    Total Protein 6.0 (*)    GFR, Estimated 46 (*)    All other components within normal limits  CBC WITH DIFFERENTIAL/PLATELET - Abnormal; Notable for the following components:   RBC 4.20 (*)    Hemoglobin 12.7 (*)    Platelets 125 (*)    All other components within normal limits    EKG: None  Radiology: VAS US  LOWER EXTREMITY VENOUS (DVT) (7a-7p) Result Date: 08/18/2024  Lower Venous DVT Study Patient Name:  Donald Todd  Date of Exam:   08/18/2024 Medical Rec #: [id number]         Accession #:    [id number] Date of Birth: Sep 01, 1929         Patient Gender: M Patient Age:   84 years Exam Location:  [facility] Procedure:      VAS US  LOWER EXTREMITY VENOUS (DVT) Referring Phys: GLENDIA  Shenetta Schnackenberg --------------------------------------------------------------------------------  Indications: Edema.  Anticoagulation: Eliquis . Limitations: Poor ultrasound/tissue interface. Comparison Study: Previous exam on 02/13/2024 was negative for DVT Performing Technologist: Ezzie Potters RVT, RDMS  Examination Guidelines: A complete evaluation includes B-mode imaging, spectral Doppler, color Doppler, and power Doppler as needed of all accessible portions of each vessel. Bilateral testing is considered an integral part of a  complete examination. Limited examinations for reoccurring indications may be performed as noted. The reflux portion of the exam is performed with the patient in reverse Trendelenburg.  +---------+---------------+---------+-----------+----------+--------------+ RIGHT    CompressibilityPhasicitySpontaneityPropertiesThrombus Aging +---------+---------------+---------+-----------+----------+--------------+ CFV      Full           Yes      Yes                                 +---------+---------------+---------+-----------+----------+--------------+ SFJ      Full                                                        +---------+---------------+---------+-----------+----------+--------------+ FV Prox  Full           Yes      Yes                                 +---------+---------------+---------+-----------+----------+--------------+ FV Mid   Full           Yes      Yes                                 +---------+---------------+---------+-----------+----------+--------------+ FV DistalFull           Yes      Yes                                 +---------+---------------+---------+-----------+----------+--------------+ PFV      Full                                                        +---------+---------------+---------+-----------+----------+--------------+ POP      Full           Yes      Yes                                  +---------+---------------+---------+-----------+----------+--------------+ PTV      Full                                                        +---------+---------------+---------+-----------+----------+--------------+ PERO     Full                                                        +---------+---------------+---------+-----------+----------+--------------+   +----+---------------+---------+-----------+----------+--------------+ LEFTCompressibilityPhasicitySpontaneityPropertiesThrombus Aging +----+---------------+---------+-----------+----------+--------------+ CFV Full           Yes      Yes                                 +----+---------------+---------+-----------+----------+--------------+  Summary: RIGHT: - There is no evidence of deep vein thrombosis in the lower extremity.  - No cystic structure found in the popliteal fossa. Subcutaneous edema in area of calf and ankle.  LEFT: - No evidence of common femoral vein obstruction.   *See table(s) above for measurements and observations.    Preliminary      Procedures   Medications Ordered in the ED - No data to display                                  Medical Decision Making Amount and/or Complexity of Data Reviewed Labs: ordered.    Details: Hemoglobin mildly anemic, near baseline   Patient's DVT ultrasound obtained here shows no DVT.  It is unclear what the images showed a few days ago at the facility.  Either way, given he is already on Eliquis  and compliant with this, with no current DVT and no signs or symptoms of PE, I do not think changing his anticoagulation or adding anticoagulation such as Lovenox  would help and would be potentially dangerous.  He is currently asymptomatic.  Will discharge with return precautions.     Final diagnoses:  Right leg swelling    ED Discharge Orders     None          Freddi Hamilton, MD 08/18/24 2248

## 2024-08-19 DIAGNOSIS — R441 Visual hallucinations: Secondary | ICD-10-CM | POA: Diagnosis not present

## 2024-08-19 DIAGNOSIS — M62551 Muscle wasting and atrophy, not elsewhere classified, right thigh: Secondary | ICD-10-CM | POA: Diagnosis not present

## 2024-08-19 DIAGNOSIS — F331 Major depressive disorder, recurrent, moderate: Secondary | ICD-10-CM | POA: Diagnosis not present

## 2024-08-19 DIAGNOSIS — G3184 Mild cognitive impairment, so stated: Secondary | ICD-10-CM | POA: Diagnosis not present

## 2024-08-19 DIAGNOSIS — R488 Other symbolic dysfunctions: Secondary | ICD-10-CM | POA: Diagnosis not present

## 2024-08-19 DIAGNOSIS — R4185 Anosognosia: Secondary | ICD-10-CM | POA: Diagnosis not present

## 2024-08-19 DIAGNOSIS — R600 Localized edema: Secondary | ICD-10-CM | POA: Diagnosis not present

## 2024-08-19 DIAGNOSIS — R1310 Dysphagia, unspecified: Secondary | ICD-10-CM | POA: Diagnosis not present

## 2024-08-19 DIAGNOSIS — M62552 Muscle wasting and atrophy, not elsewhere classified, left thigh: Secondary | ICD-10-CM | POA: Diagnosis not present

## 2024-08-19 DIAGNOSIS — R2689 Other abnormalities of gait and mobility: Secondary | ICD-10-CM | POA: Diagnosis not present

## 2024-08-19 DIAGNOSIS — R4789 Other speech disturbances: Secondary | ICD-10-CM | POA: Diagnosis not present

## 2024-08-19 DIAGNOSIS — M62561 Muscle wasting and atrophy, not elsewhere classified, right lower leg: Secondary | ICD-10-CM | POA: Diagnosis not present

## 2024-08-19 DIAGNOSIS — F411 Generalized anxiety disorder: Secondary | ICD-10-CM | POA: Diagnosis not present

## 2024-08-19 DIAGNOSIS — G8929 Other chronic pain: Secondary | ICD-10-CM | POA: Diagnosis not present

## 2024-08-19 DIAGNOSIS — R1312 Dysphagia, oropharyngeal phase: Secondary | ICD-10-CM | POA: Diagnosis not present

## 2024-08-19 DIAGNOSIS — M62562 Muscle wasting and atrophy, not elsewhere classified, left lower leg: Secondary | ICD-10-CM | POA: Diagnosis not present

## 2024-08-20 DIAGNOSIS — R2681 Unsteadiness on feet: Secondary | ICD-10-CM | POA: Diagnosis not present

## 2024-08-20 DIAGNOSIS — M62521 Muscle wasting and atrophy, not elsewhere classified, right upper arm: Secondary | ICD-10-CM | POA: Diagnosis not present

## 2024-08-20 DIAGNOSIS — R488 Other symbolic dysfunctions: Secondary | ICD-10-CM | POA: Diagnosis not present

## 2024-08-22 DIAGNOSIS — R488 Other symbolic dysfunctions: Secondary | ICD-10-CM | POA: Diagnosis not present

## 2024-08-22 DIAGNOSIS — R2689 Other abnormalities of gait and mobility: Secondary | ICD-10-CM | POA: Diagnosis not present

## 2024-08-22 DIAGNOSIS — M62561 Muscle wasting and atrophy, not elsewhere classified, right lower leg: Secondary | ICD-10-CM | POA: Diagnosis not present

## 2024-08-22 DIAGNOSIS — M62562 Muscle wasting and atrophy, not elsewhere classified, left lower leg: Secondary | ICD-10-CM | POA: Diagnosis not present

## 2024-08-22 DIAGNOSIS — M62551 Muscle wasting and atrophy, not elsewhere classified, right thigh: Secondary | ICD-10-CM | POA: Diagnosis not present

## 2024-08-22 DIAGNOSIS — R2681 Unsteadiness on feet: Secondary | ICD-10-CM | POA: Diagnosis not present

## 2024-08-22 DIAGNOSIS — M62552 Muscle wasting and atrophy, not elsewhere classified, left thigh: Secondary | ICD-10-CM | POA: Diagnosis not present

## 2024-08-22 DIAGNOSIS — M62521 Muscle wasting and atrophy, not elsewhere classified, right upper arm: Secondary | ICD-10-CM | POA: Diagnosis not present

## 2024-08-23 DIAGNOSIS — R1312 Dysphagia, oropharyngeal phase: Secondary | ICD-10-CM | POA: Diagnosis not present

## 2024-08-23 DIAGNOSIS — R1310 Dysphagia, unspecified: Secondary | ICD-10-CM | POA: Diagnosis not present

## 2024-08-23 DIAGNOSIS — R488 Other symbolic dysfunctions: Secondary | ICD-10-CM | POA: Diagnosis not present

## 2024-08-23 DIAGNOSIS — R4789 Other speech disturbances: Secondary | ICD-10-CM | POA: Diagnosis not present

## 2024-08-23 DIAGNOSIS — R4185 Anosognosia: Secondary | ICD-10-CM | POA: Diagnosis not present

## 2024-08-26 ENCOUNTER — Institutional Professional Consult (permissible substitution) (INDEPENDENT_AMBULATORY_CARE_PROVIDER_SITE_OTHER): Admitting: Physician Assistant

## 2024-08-26 DIAGNOSIS — R4185 Anosognosia: Secondary | ICD-10-CM | POA: Diagnosis not present

## 2024-08-26 DIAGNOSIS — R488 Other symbolic dysfunctions: Secondary | ICD-10-CM | POA: Diagnosis not present

## 2024-08-26 DIAGNOSIS — R1312 Dysphagia, oropharyngeal phase: Secondary | ICD-10-CM | POA: Diagnosis not present

## 2024-08-26 DIAGNOSIS — R1310 Dysphagia, unspecified: Secondary | ICD-10-CM | POA: Diagnosis not present

## 2024-08-26 DIAGNOSIS — R4789 Other speech disturbances: Secondary | ICD-10-CM | POA: Diagnosis not present

## 2024-08-27 DIAGNOSIS — M62561 Muscle wasting and atrophy, not elsewhere classified, right lower leg: Secondary | ICD-10-CM | POA: Diagnosis not present

## 2024-08-27 DIAGNOSIS — M62521 Muscle wasting and atrophy, not elsewhere classified, right upper arm: Secondary | ICD-10-CM | POA: Diagnosis not present

## 2024-08-27 DIAGNOSIS — M62552 Muscle wasting and atrophy, not elsewhere classified, left thigh: Secondary | ICD-10-CM | POA: Diagnosis not present

## 2024-08-27 DIAGNOSIS — R488 Other symbolic dysfunctions: Secondary | ICD-10-CM | POA: Diagnosis not present

## 2024-08-27 DIAGNOSIS — R2689 Other abnormalities of gait and mobility: Secondary | ICD-10-CM | POA: Diagnosis not present

## 2024-08-27 DIAGNOSIS — C44329 Squamous cell carcinoma of skin of other parts of face: Secondary | ICD-10-CM | POA: Diagnosis not present

## 2024-08-27 DIAGNOSIS — R2681 Unsteadiness on feet: Secondary | ICD-10-CM | POA: Diagnosis not present

## 2024-08-27 DIAGNOSIS — M62551 Muscle wasting and atrophy, not elsewhere classified, right thigh: Secondary | ICD-10-CM | POA: Diagnosis not present

## 2024-08-27 DIAGNOSIS — M62562 Muscle wasting and atrophy, not elsewhere classified, left lower leg: Secondary | ICD-10-CM | POA: Diagnosis not present

## 2024-08-27 DIAGNOSIS — D485 Neoplasm of uncertain behavior of skin: Secondary | ICD-10-CM | POA: Diagnosis not present

## 2024-08-28 DIAGNOSIS — R2681 Unsteadiness on feet: Secondary | ICD-10-CM | POA: Diagnosis not present

## 2024-08-28 DIAGNOSIS — R488 Other symbolic dysfunctions: Secondary | ICD-10-CM | POA: Diagnosis not present

## 2024-08-28 DIAGNOSIS — M62521 Muscle wasting and atrophy, not elsewhere classified, right upper arm: Secondary | ICD-10-CM | POA: Diagnosis not present

## 2024-08-29 DIAGNOSIS — M62552 Muscle wasting and atrophy, not elsewhere classified, left thigh: Secondary | ICD-10-CM | POA: Diagnosis not present

## 2024-08-29 DIAGNOSIS — Z23 Encounter for immunization: Secondary | ICD-10-CM | POA: Diagnosis not present

## 2024-08-29 DIAGNOSIS — M62562 Muscle wasting and atrophy, not elsewhere classified, left lower leg: Secondary | ICD-10-CM | POA: Diagnosis not present

## 2024-08-29 DIAGNOSIS — M62551 Muscle wasting and atrophy, not elsewhere classified, right thigh: Secondary | ICD-10-CM | POA: Diagnosis not present

## 2024-08-29 DIAGNOSIS — M62561 Muscle wasting and atrophy, not elsewhere classified, right lower leg: Secondary | ICD-10-CM | POA: Diagnosis not present

## 2024-08-29 DIAGNOSIS — R2689 Other abnormalities of gait and mobility: Secondary | ICD-10-CM | POA: Diagnosis not present

## 2024-08-30 DIAGNOSIS — R4789 Other speech disturbances: Secondary | ICD-10-CM | POA: Diagnosis not present

## 2024-08-30 DIAGNOSIS — R1310 Dysphagia, unspecified: Secondary | ICD-10-CM | POA: Diagnosis not present

## 2024-08-30 DIAGNOSIS — R4185 Anosognosia: Secondary | ICD-10-CM | POA: Diagnosis not present

## 2024-08-30 DIAGNOSIS — R1312 Dysphagia, oropharyngeal phase: Secondary | ICD-10-CM | POA: Diagnosis not present

## 2024-08-30 DIAGNOSIS — R488 Other symbolic dysfunctions: Secondary | ICD-10-CM | POA: Diagnosis not present

## 2024-09-02 DIAGNOSIS — F411 Generalized anxiety disorder: Secondary | ICD-10-CM | POA: Diagnosis not present

## 2024-09-02 DIAGNOSIS — R488 Other symbolic dysfunctions: Secondary | ICD-10-CM | POA: Diagnosis not present

## 2024-09-02 DIAGNOSIS — R441 Visual hallucinations: Secondary | ICD-10-CM | POA: Diagnosis not present

## 2024-09-02 DIAGNOSIS — R1310 Dysphagia, unspecified: Secondary | ICD-10-CM | POA: Diagnosis not present

## 2024-09-02 DIAGNOSIS — R1312 Dysphagia, oropharyngeal phase: Secondary | ICD-10-CM | POA: Diagnosis not present

## 2024-09-02 DIAGNOSIS — R4789 Other speech disturbances: Secondary | ICD-10-CM | POA: Diagnosis not present

## 2024-09-02 DIAGNOSIS — R4185 Anosognosia: Secondary | ICD-10-CM | POA: Diagnosis not present

## 2024-09-02 DIAGNOSIS — G3184 Mild cognitive impairment, so stated: Secondary | ICD-10-CM | POA: Diagnosis not present

## 2024-09-02 DIAGNOSIS — G8929 Other chronic pain: Secondary | ICD-10-CM | POA: Diagnosis not present

## 2024-09-02 DIAGNOSIS — F331 Major depressive disorder, recurrent, moderate: Secondary | ICD-10-CM | POA: Diagnosis not present

## 2024-09-03 DIAGNOSIS — R2689 Other abnormalities of gait and mobility: Secondary | ICD-10-CM | POA: Diagnosis not present

## 2024-09-03 DIAGNOSIS — I5022 Chronic systolic (congestive) heart failure: Secondary | ICD-10-CM | POA: Diagnosis not present

## 2024-09-03 DIAGNOSIS — M1990 Unspecified osteoarthritis, unspecified site: Secondary | ICD-10-CM | POA: Diagnosis not present

## 2024-09-03 DIAGNOSIS — M62562 Muscle wasting and atrophy, not elsewhere classified, left lower leg: Secondary | ICD-10-CM | POA: Diagnosis not present

## 2024-09-03 DIAGNOSIS — R1312 Dysphagia, oropharyngeal phase: Secondary | ICD-10-CM | POA: Diagnosis not present

## 2024-09-03 DIAGNOSIS — R4789 Other speech disturbances: Secondary | ICD-10-CM | POA: Diagnosis not present

## 2024-09-03 DIAGNOSIS — M62552 Muscle wasting and atrophy, not elsewhere classified, left thigh: Secondary | ICD-10-CM | POA: Diagnosis not present

## 2024-09-03 DIAGNOSIS — C44329 Squamous cell carcinoma of skin of other parts of face: Secondary | ICD-10-CM | POA: Diagnosis not present

## 2024-09-03 DIAGNOSIS — R4185 Anosognosia: Secondary | ICD-10-CM | POA: Diagnosis not present

## 2024-09-03 DIAGNOSIS — M159 Polyosteoarthritis, unspecified: Secondary | ICD-10-CM | POA: Diagnosis not present

## 2024-09-03 DIAGNOSIS — M62561 Muscle wasting and atrophy, not elsewhere classified, right lower leg: Secondary | ICD-10-CM | POA: Diagnosis not present

## 2024-09-03 DIAGNOSIS — R488 Other symbolic dysfunctions: Secondary | ICD-10-CM | POA: Diagnosis not present

## 2024-09-03 DIAGNOSIS — R1310 Dysphagia, unspecified: Secondary | ICD-10-CM | POA: Diagnosis not present

## 2024-09-03 DIAGNOSIS — R600 Localized edema: Secondary | ICD-10-CM | POA: Diagnosis not present

## 2024-09-03 DIAGNOSIS — M62551 Muscle wasting and atrophy, not elsewhere classified, right thigh: Secondary | ICD-10-CM | POA: Diagnosis not present

## 2024-09-04 DIAGNOSIS — R2681 Unsteadiness on feet: Secondary | ICD-10-CM | POA: Diagnosis not present

## 2024-09-04 DIAGNOSIS — M62521 Muscle wasting and atrophy, not elsewhere classified, right upper arm: Secondary | ICD-10-CM | POA: Diagnosis not present

## 2024-09-04 DIAGNOSIS — R488 Other symbolic dysfunctions: Secondary | ICD-10-CM | POA: Diagnosis not present

## 2024-09-05 DIAGNOSIS — I130 Hypertensive heart and chronic kidney disease with heart failure and stage 1 through stage 4 chronic kidney disease, or unspecified chronic kidney disease: Secondary | ICD-10-CM | POA: Diagnosis not present

## 2024-09-05 DIAGNOSIS — R2689 Other abnormalities of gait and mobility: Secondary | ICD-10-CM | POA: Diagnosis not present

## 2024-09-05 DIAGNOSIS — M62561 Muscle wasting and atrophy, not elsewhere classified, right lower leg: Secondary | ICD-10-CM | POA: Diagnosis not present

## 2024-09-05 DIAGNOSIS — I480 Paroxysmal atrial fibrillation: Secondary | ICD-10-CM | POA: Diagnosis not present

## 2024-09-05 DIAGNOSIS — N1832 Chronic kidney disease, stage 3b: Secondary | ICD-10-CM | POA: Diagnosis not present

## 2024-09-05 DIAGNOSIS — M62552 Muscle wasting and atrophy, not elsewhere classified, left thigh: Secondary | ICD-10-CM | POA: Diagnosis not present

## 2024-09-05 DIAGNOSIS — M62551 Muscle wasting and atrophy, not elsewhere classified, right thigh: Secondary | ICD-10-CM | POA: Diagnosis not present

## 2024-09-05 DIAGNOSIS — I5022 Chronic systolic (congestive) heart failure: Secondary | ICD-10-CM | POA: Diagnosis not present

## 2024-09-05 DIAGNOSIS — N401 Enlarged prostate with lower urinary tract symptoms: Secondary | ICD-10-CM | POA: Diagnosis not present

## 2024-09-05 DIAGNOSIS — M62562 Muscle wasting and atrophy, not elsewhere classified, left lower leg: Secondary | ICD-10-CM | POA: Diagnosis not present

## 2024-09-06 DIAGNOSIS — M62521 Muscle wasting and atrophy, not elsewhere classified, right upper arm: Secondary | ICD-10-CM | POA: Diagnosis not present

## 2024-09-06 DIAGNOSIS — R488 Other symbolic dysfunctions: Secondary | ICD-10-CM | POA: Diagnosis not present

## 2024-09-06 DIAGNOSIS — R2681 Unsteadiness on feet: Secondary | ICD-10-CM | POA: Diagnosis not present

## 2024-09-08 DIAGNOSIS — R4789 Other speech disturbances: Secondary | ICD-10-CM | POA: Diagnosis not present

## 2024-09-08 DIAGNOSIS — R4185 Anosognosia: Secondary | ICD-10-CM | POA: Diagnosis not present

## 2024-09-08 DIAGNOSIS — R488 Other symbolic dysfunctions: Secondary | ICD-10-CM | POA: Diagnosis not present

## 2024-09-08 DIAGNOSIS — R1310 Dysphagia, unspecified: Secondary | ICD-10-CM | POA: Diagnosis not present

## 2024-09-08 DIAGNOSIS — R1312 Dysphagia, oropharyngeal phase: Secondary | ICD-10-CM | POA: Diagnosis not present

## 2024-09-10 DIAGNOSIS — R4185 Anosognosia: Secondary | ICD-10-CM | POA: Diagnosis not present

## 2024-09-10 DIAGNOSIS — M62552 Muscle wasting and atrophy, not elsewhere classified, left thigh: Secondary | ICD-10-CM | POA: Diagnosis not present

## 2024-09-10 DIAGNOSIS — M62551 Muscle wasting and atrophy, not elsewhere classified, right thigh: Secondary | ICD-10-CM | POA: Diagnosis not present

## 2024-09-10 DIAGNOSIS — R4789 Other speech disturbances: Secondary | ICD-10-CM | POA: Diagnosis not present

## 2024-09-10 DIAGNOSIS — M62562 Muscle wasting and atrophy, not elsewhere classified, left lower leg: Secondary | ICD-10-CM | POA: Diagnosis not present

## 2024-09-10 DIAGNOSIS — R488 Other symbolic dysfunctions: Secondary | ICD-10-CM | POA: Diagnosis not present

## 2024-09-10 DIAGNOSIS — M62561 Muscle wasting and atrophy, not elsewhere classified, right lower leg: Secondary | ICD-10-CM | POA: Diagnosis not present

## 2024-09-10 DIAGNOSIS — R2689 Other abnormalities of gait and mobility: Secondary | ICD-10-CM | POA: Diagnosis not present

## 2024-09-10 DIAGNOSIS — R1310 Dysphagia, unspecified: Secondary | ICD-10-CM | POA: Diagnosis not present

## 2024-09-10 DIAGNOSIS — R1312 Dysphagia, oropharyngeal phase: Secondary | ICD-10-CM | POA: Diagnosis not present

## 2024-09-11 DIAGNOSIS — L905 Scar conditions and fibrosis of skin: Secondary | ICD-10-CM | POA: Diagnosis not present

## 2024-09-12 DIAGNOSIS — N1832 Chronic kidney disease, stage 3b: Secondary | ICD-10-CM | POA: Diagnosis not present

## 2024-09-12 DIAGNOSIS — M1990 Unspecified osteoarthritis, unspecified site: Secondary | ICD-10-CM | POA: Diagnosis not present

## 2024-09-12 DIAGNOSIS — R2681 Unsteadiness on feet: Secondary | ICD-10-CM | POA: Diagnosis not present

## 2024-09-12 DIAGNOSIS — H353230 Exudative age-related macular degeneration, bilateral, stage unspecified: Secondary | ICD-10-CM | POA: Diagnosis not present

## 2024-09-12 DIAGNOSIS — E7800 Pure hypercholesterolemia, unspecified: Secondary | ICD-10-CM | POA: Diagnosis not present

## 2024-09-12 DIAGNOSIS — M6281 Muscle weakness (generalized): Secondary | ICD-10-CM | POA: Diagnosis not present

## 2024-09-12 DIAGNOSIS — M62561 Muscle wasting and atrophy, not elsewhere classified, right lower leg: Secondary | ICD-10-CM | POA: Diagnosis not present

## 2024-09-12 DIAGNOSIS — M62552 Muscle wasting and atrophy, not elsewhere classified, left thigh: Secondary | ICD-10-CM | POA: Diagnosis not present

## 2024-09-12 DIAGNOSIS — M62521 Muscle wasting and atrophy, not elsewhere classified, right upper arm: Secondary | ICD-10-CM | POA: Diagnosis not present

## 2024-09-12 DIAGNOSIS — R7303 Prediabetes: Secondary | ICD-10-CM | POA: Diagnosis not present

## 2024-09-12 DIAGNOSIS — M62551 Muscle wasting and atrophy, not elsewhere classified, right thigh: Secondary | ICD-10-CM | POA: Diagnosis not present

## 2024-09-12 DIAGNOSIS — R488 Other symbolic dysfunctions: Secondary | ICD-10-CM | POA: Diagnosis not present

## 2024-09-12 DIAGNOSIS — J309 Allergic rhinitis, unspecified: Secondary | ICD-10-CM | POA: Diagnosis not present

## 2024-09-12 DIAGNOSIS — R42 Dizziness and giddiness: Secondary | ICD-10-CM | POA: Diagnosis not present

## 2024-09-12 DIAGNOSIS — K5900 Constipation, unspecified: Secondary | ICD-10-CM | POA: Diagnosis not present

## 2024-09-12 DIAGNOSIS — M62562 Muscle wasting and atrophy, not elsewhere classified, left lower leg: Secondary | ICD-10-CM | POA: Diagnosis not present

## 2024-09-12 DIAGNOSIS — N401 Enlarged prostate with lower urinary tract symptoms: Secondary | ICD-10-CM | POA: Diagnosis not present

## 2024-09-12 DIAGNOSIS — R2689 Other abnormalities of gait and mobility: Secondary | ICD-10-CM | POA: Diagnosis not present

## 2024-09-12 DIAGNOSIS — I130 Hypertensive heart and chronic kidney disease with heart failure and stage 1 through stage 4 chronic kidney disease, or unspecified chronic kidney disease: Secondary | ICD-10-CM | POA: Diagnosis not present

## 2024-09-13 DIAGNOSIS — R488 Other symbolic dysfunctions: Secondary | ICD-10-CM | POA: Diagnosis not present

## 2024-09-13 DIAGNOSIS — M62521 Muscle wasting and atrophy, not elsewhere classified, right upper arm: Secondary | ICD-10-CM | POA: Diagnosis not present

## 2024-09-13 DIAGNOSIS — R2681 Unsteadiness on feet: Secondary | ICD-10-CM | POA: Diagnosis not present

## 2024-09-16 DIAGNOSIS — R4185 Anosognosia: Secondary | ICD-10-CM | POA: Diagnosis not present

## 2024-09-16 DIAGNOSIS — R4789 Other speech disturbances: Secondary | ICD-10-CM | POA: Diagnosis not present

## 2024-09-16 DIAGNOSIS — G8929 Other chronic pain: Secondary | ICD-10-CM | POA: Diagnosis not present

## 2024-09-16 DIAGNOSIS — F411 Generalized anxiety disorder: Secondary | ICD-10-CM | POA: Diagnosis not present

## 2024-09-16 DIAGNOSIS — G3184 Mild cognitive impairment, so stated: Secondary | ICD-10-CM | POA: Diagnosis not present

## 2024-09-16 DIAGNOSIS — F331 Major depressive disorder, recurrent, moderate: Secondary | ICD-10-CM | POA: Diagnosis not present

## 2024-09-16 DIAGNOSIS — R488 Other symbolic dysfunctions: Secondary | ICD-10-CM | POA: Diagnosis not present

## 2024-09-16 DIAGNOSIS — R1310 Dysphagia, unspecified: Secondary | ICD-10-CM | POA: Diagnosis not present

## 2024-09-16 DIAGNOSIS — R441 Visual hallucinations: Secondary | ICD-10-CM | POA: Diagnosis not present

## 2024-09-16 DIAGNOSIS — R1312 Dysphagia, oropharyngeal phase: Secondary | ICD-10-CM | POA: Diagnosis not present

## 2024-09-17 DIAGNOSIS — M62551 Muscle wasting and atrophy, not elsewhere classified, right thigh: Secondary | ICD-10-CM | POA: Diagnosis not present

## 2024-09-17 DIAGNOSIS — M62562 Muscle wasting and atrophy, not elsewhere classified, left lower leg: Secondary | ICD-10-CM | POA: Diagnosis not present

## 2024-09-17 DIAGNOSIS — R2689 Other abnormalities of gait and mobility: Secondary | ICD-10-CM | POA: Diagnosis not present

## 2024-09-17 DIAGNOSIS — M62552 Muscle wasting and atrophy, not elsewhere classified, left thigh: Secondary | ICD-10-CM | POA: Diagnosis not present

## 2024-09-17 DIAGNOSIS — R2681 Unsteadiness on feet: Secondary | ICD-10-CM | POA: Diagnosis not present

## 2024-09-17 DIAGNOSIS — M62521 Muscle wasting and atrophy, not elsewhere classified, right upper arm: Secondary | ICD-10-CM | POA: Diagnosis not present

## 2024-09-17 DIAGNOSIS — M62561 Muscle wasting and atrophy, not elsewhere classified, right lower leg: Secondary | ICD-10-CM | POA: Diagnosis not present

## 2024-09-17 DIAGNOSIS — R488 Other symbolic dysfunctions: Secondary | ICD-10-CM | POA: Diagnosis not present

## 2024-09-18 DIAGNOSIS — R4789 Other speech disturbances: Secondary | ICD-10-CM | POA: Diagnosis not present

## 2024-09-18 DIAGNOSIS — R2681 Unsteadiness on feet: Secondary | ICD-10-CM | POA: Diagnosis not present

## 2024-09-18 DIAGNOSIS — R1312 Dysphagia, oropharyngeal phase: Secondary | ICD-10-CM | POA: Diagnosis not present

## 2024-09-18 DIAGNOSIS — R1310 Dysphagia, unspecified: Secondary | ICD-10-CM | POA: Diagnosis not present

## 2024-09-18 DIAGNOSIS — R488 Other symbolic dysfunctions: Secondary | ICD-10-CM | POA: Diagnosis not present

## 2024-09-18 DIAGNOSIS — M62521 Muscle wasting and atrophy, not elsewhere classified, right upper arm: Secondary | ICD-10-CM | POA: Diagnosis not present

## 2024-09-18 DIAGNOSIS — R4185 Anosognosia: Secondary | ICD-10-CM | POA: Diagnosis not present

## 2024-09-19 DIAGNOSIS — R2689 Other abnormalities of gait and mobility: Secondary | ICD-10-CM | POA: Diagnosis not present

## 2024-09-19 DIAGNOSIS — M62562 Muscle wasting and atrophy, not elsewhere classified, left lower leg: Secondary | ICD-10-CM | POA: Diagnosis not present

## 2024-09-19 DIAGNOSIS — M62552 Muscle wasting and atrophy, not elsewhere classified, left thigh: Secondary | ICD-10-CM | POA: Diagnosis not present

## 2024-09-19 DIAGNOSIS — M62551 Muscle wasting and atrophy, not elsewhere classified, right thigh: Secondary | ICD-10-CM | POA: Diagnosis not present

## 2024-09-19 DIAGNOSIS — M62561 Muscle wasting and atrophy, not elsewhere classified, right lower leg: Secondary | ICD-10-CM | POA: Diagnosis not present

## 2024-09-23 DIAGNOSIS — R4185 Anosognosia: Secondary | ICD-10-CM | POA: Diagnosis not present

## 2024-09-23 DIAGNOSIS — R1312 Dysphagia, oropharyngeal phase: Secondary | ICD-10-CM | POA: Diagnosis not present

## 2024-09-23 DIAGNOSIS — R4789 Other speech disturbances: Secondary | ICD-10-CM | POA: Diagnosis not present

## 2024-09-23 DIAGNOSIS — R1310 Dysphagia, unspecified: Secondary | ICD-10-CM | POA: Diagnosis not present

## 2024-09-23 DIAGNOSIS — R488 Other symbolic dysfunctions: Secondary | ICD-10-CM | POA: Diagnosis not present

## 2024-09-24 DIAGNOSIS — M62552 Muscle wasting and atrophy, not elsewhere classified, left thigh: Secondary | ICD-10-CM | POA: Diagnosis not present

## 2024-09-24 DIAGNOSIS — M62561 Muscle wasting and atrophy, not elsewhere classified, right lower leg: Secondary | ICD-10-CM | POA: Diagnosis not present

## 2024-09-24 DIAGNOSIS — R2681 Unsteadiness on feet: Secondary | ICD-10-CM | POA: Diagnosis not present

## 2024-09-24 DIAGNOSIS — R2689 Other abnormalities of gait and mobility: Secondary | ICD-10-CM | POA: Diagnosis not present

## 2024-09-24 DIAGNOSIS — M62551 Muscle wasting and atrophy, not elsewhere classified, right thigh: Secondary | ICD-10-CM | POA: Diagnosis not present

## 2024-09-24 DIAGNOSIS — R488 Other symbolic dysfunctions: Secondary | ICD-10-CM | POA: Diagnosis not present

## 2024-09-24 DIAGNOSIS — M62562 Muscle wasting and atrophy, not elsewhere classified, left lower leg: Secondary | ICD-10-CM | POA: Diagnosis not present

## 2024-09-24 DIAGNOSIS — M62521 Muscle wasting and atrophy, not elsewhere classified, right upper arm: Secondary | ICD-10-CM | POA: Diagnosis not present

## 2024-09-25 DIAGNOSIS — R1312 Dysphagia, oropharyngeal phase: Secondary | ICD-10-CM | POA: Diagnosis not present

## 2024-09-25 DIAGNOSIS — R488 Other symbolic dysfunctions: Secondary | ICD-10-CM | POA: Diagnosis not present

## 2024-09-25 DIAGNOSIS — R4789 Other speech disturbances: Secondary | ICD-10-CM | POA: Diagnosis not present

## 2024-09-25 DIAGNOSIS — R4185 Anosognosia: Secondary | ICD-10-CM | POA: Diagnosis not present

## 2024-09-25 DIAGNOSIS — R1310 Dysphagia, unspecified: Secondary | ICD-10-CM | POA: Diagnosis not present

## 2024-09-26 DIAGNOSIS — M62552 Muscle wasting and atrophy, not elsewhere classified, left thigh: Secondary | ICD-10-CM | POA: Diagnosis not present

## 2024-09-26 DIAGNOSIS — M62521 Muscle wasting and atrophy, not elsewhere classified, right upper arm: Secondary | ICD-10-CM | POA: Diagnosis not present

## 2024-09-26 DIAGNOSIS — M62562 Muscle wasting and atrophy, not elsewhere classified, left lower leg: Secondary | ICD-10-CM | POA: Diagnosis not present

## 2024-09-26 DIAGNOSIS — R488 Other symbolic dysfunctions: Secondary | ICD-10-CM | POA: Diagnosis not present

## 2024-09-26 DIAGNOSIS — M62551 Muscle wasting and atrophy, not elsewhere classified, right thigh: Secondary | ICD-10-CM | POA: Diagnosis not present

## 2024-09-26 DIAGNOSIS — M62561 Muscle wasting and atrophy, not elsewhere classified, right lower leg: Secondary | ICD-10-CM | POA: Diagnosis not present

## 2024-09-26 DIAGNOSIS — N401 Enlarged prostate with lower urinary tract symptoms: Secondary | ICD-10-CM | POA: Diagnosis not present

## 2024-09-26 DIAGNOSIS — C642 Malignant neoplasm of left kidney, except renal pelvis: Secondary | ICD-10-CM | POA: Diagnosis not present

## 2024-09-26 DIAGNOSIS — R2689 Other abnormalities of gait and mobility: Secondary | ICD-10-CM | POA: Diagnosis not present

## 2024-09-26 DIAGNOSIS — R2681 Unsteadiness on feet: Secondary | ICD-10-CM | POA: Diagnosis not present

## 2024-09-26 DIAGNOSIS — R3915 Urgency of urination: Secondary | ICD-10-CM | POA: Diagnosis not present

## 2024-09-27 DIAGNOSIS — I10 Essential (primary) hypertension: Secondary | ICD-10-CM | POA: Diagnosis not present

## 2024-09-27 DIAGNOSIS — I5022 Chronic systolic (congestive) heart failure: Secondary | ICD-10-CM | POA: Diagnosis not present

## 2024-09-30 DIAGNOSIS — F411 Generalized anxiety disorder: Secondary | ICD-10-CM | POA: Diagnosis not present

## 2024-09-30 DIAGNOSIS — R4789 Other speech disturbances: Secondary | ICD-10-CM | POA: Diagnosis not present

## 2024-09-30 DIAGNOSIS — R488 Other symbolic dysfunctions: Secondary | ICD-10-CM | POA: Diagnosis not present

## 2024-09-30 DIAGNOSIS — G3184 Mild cognitive impairment, so stated: Secondary | ICD-10-CM | POA: Diagnosis not present

## 2024-09-30 DIAGNOSIS — F331 Major depressive disorder, recurrent, moderate: Secondary | ICD-10-CM | POA: Diagnosis not present

## 2024-09-30 DIAGNOSIS — R1310 Dysphagia, unspecified: Secondary | ICD-10-CM | POA: Diagnosis not present

## 2024-09-30 DIAGNOSIS — R1312 Dysphagia, oropharyngeal phase: Secondary | ICD-10-CM | POA: Diagnosis not present

## 2024-09-30 DIAGNOSIS — R4185 Anosognosia: Secondary | ICD-10-CM | POA: Diagnosis not present

## 2024-09-30 DIAGNOSIS — Z79899 Other long term (current) drug therapy: Secondary | ICD-10-CM | POA: Diagnosis not present

## 2024-09-30 DIAGNOSIS — G8929 Other chronic pain: Secondary | ICD-10-CM | POA: Diagnosis not present

## 2024-09-30 DIAGNOSIS — R441 Visual hallucinations: Secondary | ICD-10-CM | POA: Diagnosis not present

## 2024-10-01 DIAGNOSIS — R2689 Other abnormalities of gait and mobility: Secondary | ICD-10-CM | POA: Diagnosis not present

## 2024-10-01 DIAGNOSIS — M62562 Muscle wasting and atrophy, not elsewhere classified, left lower leg: Secondary | ICD-10-CM | POA: Diagnosis not present

## 2024-10-01 DIAGNOSIS — M62521 Muscle wasting and atrophy, not elsewhere classified, right upper arm: Secondary | ICD-10-CM | POA: Diagnosis not present

## 2024-10-01 DIAGNOSIS — M62552 Muscle wasting and atrophy, not elsewhere classified, left thigh: Secondary | ICD-10-CM | POA: Diagnosis not present

## 2024-10-01 DIAGNOSIS — M62561 Muscle wasting and atrophy, not elsewhere classified, right lower leg: Secondary | ICD-10-CM | POA: Diagnosis not present

## 2024-10-01 DIAGNOSIS — R2681 Unsteadiness on feet: Secondary | ICD-10-CM | POA: Diagnosis not present

## 2024-10-01 DIAGNOSIS — R488 Other symbolic dysfunctions: Secondary | ICD-10-CM | POA: Diagnosis not present

## 2024-10-01 DIAGNOSIS — M62551 Muscle wasting and atrophy, not elsewhere classified, right thigh: Secondary | ICD-10-CM | POA: Diagnosis not present

## 2024-10-03 DIAGNOSIS — M62561 Muscle wasting and atrophy, not elsewhere classified, right lower leg: Secondary | ICD-10-CM | POA: Diagnosis not present

## 2024-10-03 DIAGNOSIS — M62551 Muscle wasting and atrophy, not elsewhere classified, right thigh: Secondary | ICD-10-CM | POA: Diagnosis not present

## 2024-10-03 DIAGNOSIS — R2681 Unsteadiness on feet: Secondary | ICD-10-CM | POA: Diagnosis not present

## 2024-10-03 DIAGNOSIS — M62552 Muscle wasting and atrophy, not elsewhere classified, left thigh: Secondary | ICD-10-CM | POA: Diagnosis not present

## 2024-10-03 DIAGNOSIS — R1310 Dysphagia, unspecified: Secondary | ICD-10-CM | POA: Diagnosis not present

## 2024-10-03 DIAGNOSIS — R2689 Other abnormalities of gait and mobility: Secondary | ICD-10-CM | POA: Diagnosis not present

## 2024-10-03 DIAGNOSIS — R4789 Other speech disturbances: Secondary | ICD-10-CM | POA: Diagnosis not present

## 2024-10-03 DIAGNOSIS — R488 Other symbolic dysfunctions: Secondary | ICD-10-CM | POA: Diagnosis not present

## 2024-10-03 DIAGNOSIS — R4185 Anosognosia: Secondary | ICD-10-CM | POA: Diagnosis not present

## 2024-10-03 DIAGNOSIS — M62562 Muscle wasting and atrophy, not elsewhere classified, left lower leg: Secondary | ICD-10-CM | POA: Diagnosis not present

## 2024-10-03 DIAGNOSIS — R1312 Dysphagia, oropharyngeal phase: Secondary | ICD-10-CM | POA: Diagnosis not present

## 2024-10-03 DIAGNOSIS — M62521 Muscle wasting and atrophy, not elsewhere classified, right upper arm: Secondary | ICD-10-CM | POA: Diagnosis not present

## 2024-10-07 ENCOUNTER — Inpatient Hospital Stay: Admitting: Hematology & Oncology

## 2024-10-07 ENCOUNTER — Inpatient Hospital Stay

## 2024-10-07 DIAGNOSIS — R488 Other symbolic dysfunctions: Secondary | ICD-10-CM | POA: Diagnosis not present

## 2024-10-07 DIAGNOSIS — R1310 Dysphagia, unspecified: Secondary | ICD-10-CM | POA: Diagnosis not present

## 2024-10-07 DIAGNOSIS — R1312 Dysphagia, oropharyngeal phase: Secondary | ICD-10-CM | POA: Diagnosis not present

## 2024-10-07 DIAGNOSIS — R4185 Anosognosia: Secondary | ICD-10-CM | POA: Diagnosis not present

## 2024-10-07 DIAGNOSIS — R4789 Other speech disturbances: Secondary | ICD-10-CM | POA: Diagnosis not present

## 2024-10-08 DIAGNOSIS — R2689 Other abnormalities of gait and mobility: Secondary | ICD-10-CM | POA: Diagnosis not present

## 2024-10-08 DIAGNOSIS — M62551 Muscle wasting and atrophy, not elsewhere classified, right thigh: Secondary | ICD-10-CM | POA: Diagnosis not present

## 2024-10-08 DIAGNOSIS — M62552 Muscle wasting and atrophy, not elsewhere classified, left thigh: Secondary | ICD-10-CM | POA: Diagnosis not present

## 2024-10-08 DIAGNOSIS — R488 Other symbolic dysfunctions: Secondary | ICD-10-CM | POA: Diagnosis not present

## 2024-10-08 DIAGNOSIS — M62562 Muscle wasting and atrophy, not elsewhere classified, left lower leg: Secondary | ICD-10-CM | POA: Diagnosis not present

## 2024-10-08 DIAGNOSIS — M62561 Muscle wasting and atrophy, not elsewhere classified, right lower leg: Secondary | ICD-10-CM | POA: Diagnosis not present

## 2024-10-08 DIAGNOSIS — M62521 Muscle wasting and atrophy, not elsewhere classified, right upper arm: Secondary | ICD-10-CM | POA: Diagnosis not present

## 2024-10-08 DIAGNOSIS — R2681 Unsteadiness on feet: Secondary | ICD-10-CM | POA: Diagnosis not present

## 2024-10-11 DIAGNOSIS — R488 Other symbolic dysfunctions: Secondary | ICD-10-CM | POA: Diagnosis not present

## 2024-10-11 DIAGNOSIS — M62521 Muscle wasting and atrophy, not elsewhere classified, right upper arm: Secondary | ICD-10-CM | POA: Diagnosis not present

## 2024-10-11 DIAGNOSIS — R2681 Unsteadiness on feet: Secondary | ICD-10-CM | POA: Diagnosis not present

## 2024-10-14 DIAGNOSIS — F331 Major depressive disorder, recurrent, moderate: Secondary | ICD-10-CM | POA: Diagnosis not present

## 2024-10-14 DIAGNOSIS — F411 Generalized anxiety disorder: Secondary | ICD-10-CM | POA: Diagnosis not present

## 2024-10-14 DIAGNOSIS — R441 Visual hallucinations: Secondary | ICD-10-CM | POA: Diagnosis not present

## 2024-10-14 DIAGNOSIS — G3184 Mild cognitive impairment, so stated: Secondary | ICD-10-CM | POA: Diagnosis not present

## 2024-10-14 DIAGNOSIS — G8929 Other chronic pain: Secondary | ICD-10-CM | POA: Diagnosis not present

## 2024-10-15 DIAGNOSIS — R2681 Unsteadiness on feet: Secondary | ICD-10-CM | POA: Diagnosis not present

## 2024-10-15 DIAGNOSIS — R488 Other symbolic dysfunctions: Secondary | ICD-10-CM | POA: Diagnosis not present

## 2024-10-15 DIAGNOSIS — M62521 Muscle wasting and atrophy, not elsewhere classified, right upper arm: Secondary | ICD-10-CM | POA: Diagnosis not present

## 2024-10-17 DIAGNOSIS — R2681 Unsteadiness on feet: Secondary | ICD-10-CM | POA: Diagnosis not present

## 2024-10-17 DIAGNOSIS — R488 Other symbolic dysfunctions: Secondary | ICD-10-CM | POA: Diagnosis not present

## 2024-10-17 DIAGNOSIS — M62521 Muscle wasting and atrophy, not elsewhere classified, right upper arm: Secondary | ICD-10-CM | POA: Diagnosis not present

## 2024-10-18 NOTE — Telephone Encounter (Signed)
**  APPOINTMENT REQUEST**  Son Garnette Are they asking to reschedule? Yes - 11/14 If yes, due to: sick Is patient calling due to a recall? No Is this appointment to be scheduled with a resident? No Patient is requesting an appointment to be seen for:  NO MAC MENTREDDY ASJ - Return for 10 weeks mentreddy HO. IVF OS IVE HD OD . INS AUTH: VABYSMO  for OS & EYLEA  HD for OD Patient prefers: Certain Day/s- No Wednesdays  Was the first available appointment offered at the time of call: No- None in timeframe  Caller declined offered appointment and/or nothing was available at time of call   Please return call to:Heron at [phone]  Is it okay to leave a detailed message: Yes  Caller has been made aware clinic has 5-7 business days to return call: Yes

## 2024-10-18 NOTE — Telephone Encounter (Signed)
 Son, Garnette calling back requesting to call him to reschedule patient's injection with Dr. Mathilda that patient missed today Call back number is [phone]

## 2024-10-21 ENCOUNTER — Inpatient Hospital Stay (HOSPITAL_BASED_OUTPATIENT_CLINIC_OR_DEPARTMENT_OTHER): Admitting: Hematology & Oncology

## 2024-10-21 ENCOUNTER — Encounter: Payer: Self-pay | Admitting: Hematology & Oncology

## 2024-10-21 ENCOUNTER — Inpatient Hospital Stay: Attending: Hematology & Oncology

## 2024-10-21 ENCOUNTER — Other Ambulatory Visit: Payer: Self-pay | Admitting: *Deleted

## 2024-10-21 VITALS — BP 155/87 | HR 96 | Temp 97.7°F | Resp 16 | Wt 139.1 lb

## 2024-10-21 DIAGNOSIS — Z905 Acquired absence of kidney: Secondary | ICD-10-CM | POA: Insufficient documentation

## 2024-10-21 DIAGNOSIS — Z923 Personal history of irradiation: Secondary | ICD-10-CM | POA: Insufficient documentation

## 2024-10-21 DIAGNOSIS — C7801 Secondary malignant neoplasm of right lung: Secondary | ICD-10-CM

## 2024-10-21 DIAGNOSIS — Z85528 Personal history of other malignant neoplasm of kidney: Secondary | ICD-10-CM | POA: Diagnosis not present

## 2024-10-21 DIAGNOSIS — R7303 Prediabetes: Secondary | ICD-10-CM

## 2024-10-21 LAB — CBC WITH DIFFERENTIAL (CANCER CENTER ONLY)
Abs Immature Granulocytes: 0.01 K/uL (ref 0.00–0.07)
Basophils Absolute: 0 K/uL (ref 0.0–0.1)
Basophils Relative: 1 %
Eosinophils Absolute: 0 K/uL (ref 0.0–0.5)
Eosinophils Relative: 1 %
HCT: 40.8 % (ref 39.0–52.0)
Hemoglobin: 13.5 g/dL (ref 13.0–17.0)
Immature Granulocytes: 0 %
Lymphocytes Relative: 16 %
Lymphs Abs: 0.9 K/uL (ref 0.7–4.0)
MCH: 31 pg (ref 26.0–34.0)
MCHC: 33.1 g/dL (ref 30.0–36.0)
MCV: 93.8 fL (ref 80.0–100.0)
Monocytes Absolute: 0.2 K/uL (ref 0.1–1.0)
Monocytes Relative: 4 %
Neutro Abs: 4.6 K/uL (ref 1.7–7.7)
Neutrophils Relative %: 78 %
Platelet Count: 143 K/uL — ABNORMAL LOW (ref 150–400)
RBC: 4.35 MIL/uL (ref 4.22–5.81)
RDW: 13.6 % (ref 11.5–15.5)
WBC Count: 5.8 K/uL (ref 4.0–10.5)
nRBC: 0 % (ref 0.0–0.2)

## 2024-10-21 LAB — COMPREHENSIVE METABOLIC PANEL WITH GFR
ALT: 20 U/L (ref 0–44)
AST: 25 U/L (ref 15–41)
Albumin: 4.3 g/dL (ref 3.5–5.0)
Alkaline Phosphatase: 95 U/L (ref 38–126)
Anion gap: 11 (ref 5–15)
BUN: 25 mg/dL — ABNORMAL HIGH (ref 8–23)
CO2: 25 mmol/L (ref 22–32)
Calcium: 9.4 mg/dL (ref 8.9–10.3)
Chloride: 104 mmol/L (ref 98–111)
Creatinine, Ser: 1.32 mg/dL — ABNORMAL HIGH (ref 0.61–1.24)
GFR, Estimated: 50 mL/min — ABNORMAL LOW (ref >60)
Glucose, Bld: 192 mg/dL — ABNORMAL HIGH (ref 70–99)
Potassium: 5.6 mmol/L — ABNORMAL HIGH (ref 3.5–5.1)
Sodium: 140 mmol/L (ref 135–145)
Total Bilirubin: 0.6 mg/dL (ref 0.0–1.2)
Total Protein: 6.4 g/dL — ABNORMAL LOW (ref 6.5–8.1)

## 2024-10-21 LAB — LACTATE DEHYDROGENASE: LDH: 201 U/L (ref 105–235)

## 2024-10-21 NOTE — Progress Notes (Signed)
 Hematology and Oncology Follow Up Visit  ARJAN STROHM [id number] [birth year] 89 y.o. 10/21/2024 3:31 PM Patient, No Pcp PerNo ref. 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  Interim History: Mr. Everingham returns today for a long awaited follow-up.  He last saw him back in 2023-07-01.  His wife passed away in 02-29-2024 of this year.  I know that she had issues from NASH.  He is at [facility].  He seems to be doing okay there.    He has complaint of dizziness.  This been going on for quite a while.  I am not sure this is ever been evaluated.  He has had some episodes of falling.  Thankfully has not broken anything.  He has not lost weight.  He is eating okay.  He has had no change in bowel or bladder habits.  He has had no bleeding.  He has had no fever.  He has had no exposure to COVID.  His last CT scan was done back in 2023/07/01.  This did show that he had pulmonary nodules which I think were pretty stable.  He has had past surgeries for his recurrent kidney cancer.  I think the last surgery was in 2022 where he had a retroperitoneal biopsy.  I think he had radiation therapy after this.  We really are going to have to get a CT scan to see what is going on with him.  He has had no bleeding.  He has had no nausea or vomiting.  He has had no I think bowel or bladder incontinence.  Overall, I will say that his performance status is probably ECOG 3.   Medications: Updated on review. Current Outpatient Medications  Medication Sig Dispense Refill   acetaminophen  (TYLENOL ) 325 MG tablet Take 1-2 tablets (325-650 mg total) by mouth every 4 (four) hours as needed for mild pain.     amLODipine  (NORVASC ) 2.5 MG tablet Take 2.5 mg by mouth daily.     amLODipine  (NORVASC ) 5 MG tablet Take 5 mg by mouth daily.     apixaban  (ELIQUIS ) 5 MG TABS tablet Take 1 tablet (5  mg total) by mouth 2 (two) times daily. 60 tablet 2   atorvastatin  (LIPITOR) 20 MG tablet Take 20 mg by mouth every evening.     calcium  carbonate (TUMS - DOSED IN MG ELEMENTAL CALCIUM ) 500 MG chewable tablet Chew 1,000 mg by mouth daily as needed for indigestion or heartburn.     docusate sodium  (COLACE) 100 MG capsule Take 1 capsule (100 mg total) by mouth every 12 (twelve) hours. 60 capsule 0   fluticasone  (FLONASE ) 50 MCG/ACT nasal spray Place 2 sprays into both nostrils daily.     loratadine  (CLARITIN ) 10 MG tablet Take 10 mg by mouth every evening.     meclizine  (ANTIVERT ) 25 MG tablet Take 25 mg by mouth every 12 (twelve) hours as needed for dizziness.     metoprolol  succinate (TOPROL -XL) 50 MG 24 hr tablet Take 100 mg in A.M. and 50 mg in P.M. 90 tablet 6   Multiple Vitamins-Minerals (PRESERVISION AREDS 2) CAPS Take 1 capsule by mouth 2 (two) times daily.     Nutritional Supplements (ENSURE ORIGINAL) LIQD Take 1 Container by mouth 2 (two) times daily between meals.     polyethylene glycol (MIRALAX  / GLYCOLAX ) 17 g packet Take 17 g by mouth daily. 14 each  2   QUEtiapine  (SEROQUEL  XR) 50 MG TB24 24 hr tablet Take 100 mg by mouth at bedtime.     QUEtiapine  (SEROQUEL ) 25 MG tablet Take 1 tablet (25 mg total) by mouth at bedtime. 30 tablet 0   tamsulosin  (FLOMAX ) 0.4 MG CAPS capsule Take 1 capsule (0.4 mg total) by mouth daily after supper. 180 capsule 3   ipratropium (ATROVENT ) 0.03 % nasal spray Place 1-2 sprays into both nostrils 2 (two) times daily as needed (allergies). (Patient not taking: Reported on 10/21/2024)     loperamide (IMODIUM A-D) 2 MG tablet Take 2 mg by mouth every 6 (six) hours as needed for diarrhea or loose stools. (Patient not taking: Reported on 10/21/2024)     nitroGLYCERIN  (NITROSTAT ) 0.4 MG SL tablet Place 1 tablet (0.4 mg total) under the tongue every 5 (five) minutes as needed for chest pain (Max 3 doeses). (Patient not taking: Reported on 10/21/2024) 25 tablet 3    Pedialyte (PEDIALYTE) SOLN Take 236 mLs by mouth 3 (three) times daily as needed (dehydration secondary to nausea). (Patient not taking: Reported on 10/21/2024)     No current facility-administered medications for this visit.     Allergies:  Allergies  Allergen Reactions   Tape Hives    Surgical tape - causes blisters   Hydrocodone  Other (See Comments)    Tremors    Iohexol Other (See Comments)     Desc: PT DOES RECALL REACTION JUST SAYS IT WAS A LONG TIME AGO FOR KIDNEY X-RAYS AND HE DIDN'T TOLERATE IT WELL-ARS 05/02/09-NOTE E-CHART STATES A HOT FEELING IS HIS REACTION, BUT HE CAN'T CONFIRM THAT WAS ALL    Roxicodone  [Oxycodone ] Other (See Comments)    Tremors    Review of Systems  Constitutional: Negative.   HENT: Negative.    Eyes: Negative.   Respiratory: Negative.    Cardiovascular: Negative.   Gastrointestinal: Negative.   Genitourinary: Negative.   Musculoskeletal: Negative.   Skin: Negative.   Neurological: Negative.   Endo/Heme/Allergies: Negative.   Psychiatric/Behavioral: Negative.        Physical Exam:  Blood pressure (!) 155/87, pulse 96, temperature 97.7 F (36.5 C), temperature source Oral, resp. rate 16, weight 139 lb 1.9 oz (63.1 kg), SpO2 100%.  Physical Exam Vitals reviewed.  HENT:     Head: Normocephalic and atraumatic.  Eyes:     Pupils: Pupils are equal, round, and reactive to light.  Cardiovascular:     Rate and Rhythm: Normal rate and regular rhythm.     Heart sounds: Normal heart sounds.  Pulmonary:     Effort: Pulmonary effort is normal.     Breath sounds: Normal breath sounds.  Abdominal:     General: Bowel sounds are normal.     Palpations: Abdomen is soft.  Musculoskeletal:        General: No tenderness or deformity. Normal range of motion.     Cervical back: Normal range of motion.  Lymphadenopathy:     Cervical: No cervical adenopathy.  Skin:    General: Skin is warm and dry.     Findings: No erythema or rash.   Neurological:     Mental Status: He is alert and oriented to person, place, and time.  Psychiatric:        Behavior: Behavior normal.        Thought Content: Thought content normal.        Judgment: Judgment normal.     Lab Results: Lab Results  Component Value Date  WBC 5.8 10/21/2024   HGB 13.5 10/21/2024   HCT 40.8 10/21/2024   MCV 93.8 10/21/2024   PLT 143 (L) 10/21/2024     Chemistry      Component Value Date/Time   NA 141 08/18/2024 1621   NA 139 04/10/2024 1341   K 4.6 08/18/2024 1621   CL 107 08/18/2024 1621   CO2 22 08/18/2024 1621   BUN 25 (H) 08/18/2024 1621   BUN 33 04/10/2024 1341   CREATININE 1.41 (H) 08/18/2024 1621   CREATININE 1.51 (H) 05/04/2023 1507      Component Value Date/Time   CALCIUM  9.8 08/18/2024 1621   ALKPHOS 86 08/18/2024 1621   AST 25 08/18/2024 1621   AST 22 05/04/2023 1507   ALT 23 08/18/2024 1621   ALT 24 05/04/2023 1507   BILITOT 0.7 08/18/2024 1621   BILITOT 0.4 05/04/2023 1507     Study Result     Impression and Plan:  Mr. Fayette is a very nice 89 year old white male.  He has a past history of clear-cell carcinoma of the kidney.  He has had a past left nephrectomy.  This was back in the 2002.  He has had radiation therapy for recurrence.  This was in 2022.  He is a true [employer].  He was in the [employer].  He talked all about this.  He does need to have a CT scan.  We will see about getting 1 set up in December..  We also need to have an MRI of the brain.  He clearly is at risk for CNS disease.  It was nice to see him again.  I am just glad that he seems to be managing.  I hate that his poor wife passed away.  I know this has been hard on him.  We probably will get him back to see us  in about 2 months or so.  If everything looks okay, then we we will try to move his appointments out further.    Maude JONELLE Crease, MD 11/17/20253:31 PM

## 2024-10-22 DIAGNOSIS — R488 Other symbolic dysfunctions: Secondary | ICD-10-CM | POA: Diagnosis not present

## 2024-10-22 DIAGNOSIS — M62521 Muscle wasting and atrophy, not elsewhere classified, right upper arm: Secondary | ICD-10-CM | POA: Diagnosis not present

## 2024-10-22 DIAGNOSIS — R2681 Unsteadiness on feet: Secondary | ICD-10-CM | POA: Diagnosis not present

## 2024-10-24 DIAGNOSIS — R488 Other symbolic dysfunctions: Secondary | ICD-10-CM | POA: Diagnosis not present

## 2024-10-24 DIAGNOSIS — R1310 Dysphagia, unspecified: Secondary | ICD-10-CM | POA: Diagnosis not present

## 2024-10-24 DIAGNOSIS — R1312 Dysphagia, oropharyngeal phase: Secondary | ICD-10-CM | POA: Diagnosis not present

## 2024-10-24 DIAGNOSIS — R4185 Anosognosia: Secondary | ICD-10-CM | POA: Diagnosis not present

## 2024-10-24 DIAGNOSIS — R4789 Other speech disturbances: Secondary | ICD-10-CM | POA: Diagnosis not present

## 2024-10-25 DIAGNOSIS — H353231 Exudative age-related macular degeneration, bilateral, with active choroidal neovascularization: Secondary | ICD-10-CM | POA: Diagnosis not present

## 2024-10-25 DIAGNOSIS — H35373 Puckering of macula, bilateral: Secondary | ICD-10-CM | POA: Diagnosis not present

## 2024-10-25 DIAGNOSIS — H3554 Dystrophies primarily involving the retinal pigment epithelium: Secondary | ICD-10-CM | POA: Diagnosis not present

## 2024-10-27 DIAGNOSIS — M62521 Muscle wasting and atrophy, not elsewhere classified, right upper arm: Secondary | ICD-10-CM | POA: Diagnosis not present

## 2024-10-27 DIAGNOSIS — R2681 Unsteadiness on feet: Secondary | ICD-10-CM | POA: Diagnosis not present

## 2024-10-27 DIAGNOSIS — R488 Other symbolic dysfunctions: Secondary | ICD-10-CM | POA: Diagnosis not present

## 2024-10-28 DIAGNOSIS — R488 Other symbolic dysfunctions: Secondary | ICD-10-CM | POA: Diagnosis not present

## 2024-10-28 DIAGNOSIS — R4185 Anosognosia: Secondary | ICD-10-CM | POA: Diagnosis not present

## 2024-10-28 DIAGNOSIS — R1312 Dysphagia, oropharyngeal phase: Secondary | ICD-10-CM | POA: Diagnosis not present

## 2024-10-28 DIAGNOSIS — R4789 Other speech disturbances: Secondary | ICD-10-CM | POA: Diagnosis not present

## 2024-10-28 DIAGNOSIS — R1310 Dysphagia, unspecified: Secondary | ICD-10-CM | POA: Diagnosis not present

## 2024-10-29 DIAGNOSIS — L821 Other seborrheic keratosis: Secondary | ICD-10-CM | POA: Diagnosis not present

## 2024-10-29 DIAGNOSIS — C44629 Squamous cell carcinoma of skin of left upper limb, including shoulder: Secondary | ICD-10-CM | POA: Diagnosis not present

## 2024-10-29 DIAGNOSIS — T148XXA Other injury of unspecified body region, initial encounter: Secondary | ICD-10-CM | POA: Diagnosis not present

## 2024-10-29 DIAGNOSIS — D485 Neoplasm of uncertain behavior of skin: Secondary | ICD-10-CM | POA: Diagnosis not present

## 2024-10-29 DIAGNOSIS — Z859 Personal history of malignant neoplasm, unspecified: Secondary | ICD-10-CM | POA: Diagnosis not present

## 2024-10-30 DIAGNOSIS — I130 Hypertensive heart and chronic kidney disease with heart failure and stage 1 through stage 4 chronic kidney disease, or unspecified chronic kidney disease: Secondary | ICD-10-CM | POA: Diagnosis not present

## 2024-10-30 DIAGNOSIS — M1990 Unspecified osteoarthritis, unspecified site: Secondary | ICD-10-CM | POA: Diagnosis not present

## 2024-10-30 DIAGNOSIS — M6281 Muscle weakness (generalized): Secondary | ICD-10-CM | POA: Diagnosis not present

## 2024-10-30 DIAGNOSIS — R42 Dizziness and giddiness: Secondary | ICD-10-CM | POA: Diagnosis not present

## 2024-10-30 DIAGNOSIS — E7800 Pure hypercholesterolemia, unspecified: Secondary | ICD-10-CM | POA: Diagnosis not present

## 2024-10-30 DIAGNOSIS — R600 Localized edema: Secondary | ICD-10-CM | POA: Diagnosis not present

## 2024-10-30 DIAGNOSIS — N401 Enlarged prostate with lower urinary tract symptoms: Secondary | ICD-10-CM | POA: Diagnosis not present

## 2024-10-30 DIAGNOSIS — K5900 Constipation, unspecified: Secondary | ICD-10-CM | POA: Diagnosis not present

## 2024-10-30 DIAGNOSIS — H353230 Exudative age-related macular degeneration, bilateral, stage unspecified: Secondary | ICD-10-CM | POA: Diagnosis not present

## 2024-10-30 DIAGNOSIS — N1832 Chronic kidney disease, stage 3b: Secondary | ICD-10-CM | POA: Diagnosis not present

## 2024-10-30 DIAGNOSIS — R7303 Prediabetes: Secondary | ICD-10-CM | POA: Diagnosis not present

## 2024-10-30 DIAGNOSIS — J309 Allergic rhinitis, unspecified: Secondary | ICD-10-CM | POA: Diagnosis not present

## 2024-11-01 ENCOUNTER — Ambulatory Visit (HOSPITAL_COMMUNITY)
Admission: RE | Admit: 2024-11-01 | Discharge: 2024-11-01 | Disposition: A | Discharge status: Home / Self Care | Source: Ambulatory Visit | Attending: Hematology & Oncology | Admitting: Hematology & Oncology

## 2024-11-01 ENCOUNTER — Ambulatory Visit (HOSPITAL_COMMUNITY)
Admission: RE | Admit: 2024-11-01 | Discharge: 2024-11-01 | Disposition: A | Source: Ambulatory Visit | Attending: Hematology & Oncology | Admitting: Hematology & Oncology

## 2024-11-01 ENCOUNTER — Encounter (HOSPITAL_COMMUNITY): Payer: Self-pay

## 2024-11-01 DIAGNOSIS — C7801 Secondary malignant neoplasm of right lung: Secondary | ICD-10-CM | POA: Insufficient documentation

## 2024-11-01 DIAGNOSIS — R9082 White matter disease, unspecified: Secondary | ICD-10-CM

## 2024-11-01 DIAGNOSIS — I3139 Other pericardial effusion (noninflammatory): Secondary | ICD-10-CM | POA: Diagnosis not present

## 2024-11-01 DIAGNOSIS — Z8673 Personal history of transient ischemic attack (TIA), and cerebral infarction without residual deficits: Secondary | ICD-10-CM

## 2024-11-01 DIAGNOSIS — C649 Malignant neoplasm of unspecified kidney, except renal pelvis: Secondary | ICD-10-CM | POA: Diagnosis not present

## 2024-11-01 DIAGNOSIS — I700 Atherosclerosis of aorta: Secondary | ICD-10-CM | POA: Diagnosis not present

## 2024-11-01 MED ORDER — GADOBUTROL 1 MMOL/ML IV SOLN
6.0000 mL | Freq: Once | INTRAVENOUS | Status: AC | PRN
  Administered 2024-11-01: 6 mL via INTRAVENOUS

## 2024-11-04 ENCOUNTER — Ambulatory Visit (INDEPENDENT_AMBULATORY_CARE_PROVIDER_SITE_OTHER): Admitting: Audiology

## 2024-11-04 ENCOUNTER — Encounter (INDEPENDENT_AMBULATORY_CARE_PROVIDER_SITE_OTHER): Payer: Self-pay | Admitting: Physician Assistant

## 2024-11-04 ENCOUNTER — Ambulatory Visit (INDEPENDENT_AMBULATORY_CARE_PROVIDER_SITE_OTHER): Admitting: Physician Assistant

## 2024-11-04 VITALS — BP 139/82 | HR 66 | Ht 67.0 in | Wt 136.0 lb

## 2024-11-04 DIAGNOSIS — H9222 Otorrhagia, left ear: Secondary | ICD-10-CM

## 2024-11-04 DIAGNOSIS — H6122 Impacted cerumen, left ear: Secondary | ICD-10-CM

## 2024-11-04 DIAGNOSIS — Z859 Personal history of malignant neoplasm, unspecified: Secondary | ICD-10-CM | POA: Diagnosis not present

## 2024-11-04 NOTE — Progress Notes (Signed)
 Dear Dr. Dana, Here is my assessment for our mutual patient, Donald Todd. Thank you for allowing me the opportunity to care for your patient. Please do not hesitate to contact me should you have any other questions. Sincerely, Chyrl Cohen PA-C  Otolaryngology Clinic Note Referring provider: Dr. Dana HPI:  Donald Todd is a 89 y.o. male kindly referred by Dr. Dana   Discussed the use of AI scribe software for clinical note transcription with the patient, who gave verbal consent to proceed.  History of Present Illness    Donald Todd is a 89 year old male who presents with persistent dried blood in the ear canal. He was referred by urgent care for evaluation of persistent dried blood in the ear canal.  Several months ago, he experienced ear bleeding, which he attributes to possibly scratching something in the ear. This incident led to a visit to urgent care to stop the bleeding. Since then, he has been cautious about getting water in the ear.  The bleeding incident occurred on July 20th, and he has been unable to have it addressed until now due to scheduling issues.  No use of Q-tips, hearing difficulties, ear pain, or history of ear infections or surgeries.  He served in [employer] and was stationed in [employer].         Independent Review of Additional Tests or Records:  none   PMH/Meds/All/SocHx/FamHx/ROS:   Past Medical History:  Diagnosis Date   Arthritis    CAD (coronary artery disease)    last cath. 07-09-2010   Cancer [facility])    kidney cancer , prostate cancer    Chronic kidney disease    lt kidnet removed   H/O cardiovascular stress test 08-04-2010   ef 55% NO ISCHEMIA   H/O unilateral nephrectomy    lt. nephrectomy   Heart attack (HCC) 1993   History of kidney stones    Hyperlipemia    Hypertension    renal dopplers 218.13-normal patency   Inguinal hernia 08/01/11   right   Macular degeneration    Prostate cancer  [facility])    Prostate disease    Cancer S/P seed implant   Renal mass    Vertigo      Past Surgical History:  Procedure Laterality Date   ANGIOPLASTY  1993   athroscopic knee surgery  2002   right   bone scan  2005   CARDIAC CATHETERIZATION  07-09-2010   normal left ventricular function, coronary obsstructive disease with 20% proximal an 30-40% mid lt. anterior descending narrowing ; widely patent stent in the proximal ramus intermediate vessel; 50-70-% narrowing in the mid circumflex,and widely patent stent  in the rt. coronary    CARDIAC CATHETERIZATION  12-22-2003   widely patent stents with noncritical coronary artery disease and normalLV function   CARDIOVERSION N/A 05/01/2024   Procedure: CARDIOVERSION;  Surgeon: Lonni Slain, MD;  Location: [facility] INVASIVE CV LAB;  Service: Cardiovascular;  Laterality: N/A;   CHOLECYSTECTOMY  05/05/2009   Laparoscopic   colonscopy  2004   with biopsy   CORONARY ANGIOPLASTY WITH STENT PLACEMENT  03-15-1996   pci to rci and ramus branch using j&j [id number] stent,post dialated  at  20atm (rca)  and 14 atm (ramus branch. ef 55%             HERNIA REPAIR  08/01/11   RIH   INGUINAL HERNIA REPAIR Left 03/29/2013   Procedure: HERNIA REPAIR INGUINAL ADULT;  Surgeon: Morene  ONEIDA Olives, MD;  Location: [facility] ORS;  Service: General;  Laterality: Left;   INSERTION OF MESH Left 03/29/2013   Procedure: INSERTION OF MESH;  Surgeon: Morene ONEIDA Olives, MD;  Location: [facility] ORS;  Service: General;  Laterality: Left;   JOINT REPLACEMENT  2006 and 2010   left 2006, right 2010   KIDNEY SURGERY  2002 and 2005   2002 - partial removal of left. 2005 complete removal   PROSTATE BIOPSY     prostate seed implant     radiation treatment  2002   prostate   REVERSE SHOULDER ARTHROPLASTY Left 05/26/2017   Procedure: REVERSE LEFT TOTAL SHOULDER ARTHROPLASTY;  Surgeon: Kay Kemps, MD;  Location: [facility] OR;  Service: Orthopedics;  Laterality: Left;   stent implants  1997    Family  History  Problem Relation Age of Onset   Cancer Mother        stomach?   Stroke Father    Stroke Brother    Skin cancer Brother      Social Connections: Moderately Integrated (02/09/2024)   Social Connection and Isolation Panel    Frequency of Communication with Friends and Family: Three times a week    Frequency of Social Gatherings with Friends and Family: Three times a week    Attends Religious Services: 1 to 4 times per year    Active Member of Clubs or Organizations: No    Attends Banker Meetings: Never    Marital Status: Married      Current Outpatient Medications:    acetaminophen  (TYLENOL ) 500 MG tablet, Take 500 mg by mouth every 6 (six) hours as needed., Disp: , Rfl:    amLODipine  (NORVASC ) 5 MG tablet, Take 5 mg by mouth daily., Disp: , Rfl:    apixaban  (ELIQUIS ) 5 MG TABS tablet, Take 1 tablet (5 mg total) by mouth 2 (two) times daily., Disp: 60 tablet, Rfl: 2   atorvastatin  (LIPITOR) 20 MG tablet, Take 20 mg by mouth every evening., Disp: , Rfl:    calcium  carbonate (TUMS - DOSED IN MG ELEMENTAL CALCIUM ) 500 MG chewable tablet, Chew 1,000 mg by mouth daily as needed for indigestion or heartburn., Disp: , Rfl:    diclofenac  Sodium (VOLTAREN ) 1 % GEL, Apply 2 g topically 3 (three) times daily. To both knees, Disp: , Rfl:    docusate sodium  (COLACE) 100 MG capsule, Take 1 capsule (100 mg total) by mouth every 12 (twelve) hours., Disp: 60 capsule, Rfl: 0   fluticasone  (FLONASE ) 50 MCG/ACT nasal spray, Place 2 sprays into both nostrils daily., Disp: , Rfl:    loperamide (IMODIUM A-D) 2 MG tablet, Take 2 mg by mouth every 6 (six) hours as needed for diarrhea or loose stools., Disp: , Rfl:    loratadine  (CLARITIN ) 10 MG tablet, Take 10 mg by mouth every evening., Disp: , Rfl:    meclizine  (ANTIVERT ) 25 MG tablet, Take 25 mg by mouth every 12 (twelve) hours as needed for dizziness. (Patient taking differently: Take 25 mg by mouth every 8 (eight) hours as needed for  dizziness.), Disp: , Rfl:    metoprolol  succinate (TOPROL -XL) 50 MG 24 hr tablet, Take 100 mg in A.M. and 50 mg in P.M., Disp: 90 tablet, Rfl: 6   Multiple Vitamins-Minerals (PRESERVISION AREDS 2) CAPS, Take 1 capsule by mouth 2 (two) times daily., Disp: , Rfl:    nitroGLYCERIN  (NITROSTAT ) 0.4 MG SL tablet, Place 1 tablet (0.4 mg total) under the tongue every 5 (five) minutes as needed  for chest pain (Max 3 doeses). (Patient not taking: Reported on 10/21/2024), Disp: 25 tablet, Rfl: 3   Nutritional Supplements (ENSURE ORIGINAL) LIQD, Take 1 Container by mouth 2 (two) times daily between meals., Disp: , Rfl:    polyethylene glycol powder (GLYCOLAX /MIRALAX ) 17 GM/SCOOP powder, Take 8.5 g by mouth daily. Dissolve 1 capful (17g) in 4-8 ounces of liquid and take by mouth daily., Disp: , Rfl:    QUEtiapine  (SEROQUEL  XR) 50 MG TB24 24 hr tablet, Take 100 mg by mouth at bedtime., Disp: , Rfl:    tamsulosin  (FLOMAX ) 0.4 MG CAPS capsule, Take 1 capsule (0.4 mg total) by mouth daily after supper., Disp: 180 capsule, Rfl: 3   Physical Exam:   BP 139/82   Pulse 66   Ht 5' 7 (1.702 m)   Wt 136 lb (61.7 kg)   SpO2 94%   BMI 21.30 kg/m   Pertinent Findings  CN II-XII grossly intact Dried blood in left EAC, right EAC clear Weber 512: equal Rinne 512: AC > BC b/l  Anterior rhinoscopy: Septum midline; bilateral inferior turbinates with no hypertrophy No lesions of oral cavity/oropharynx; dentition wnl No obviously palpable neck masses/lymphadenopathy/thyromegaly No respiratory distress or stridor       Seprately Identifiable Procedures:  Procedure: bilateral ear microscopy and blood removal using microscope (CPT [phone]) - Mod 25 Pre-procedure diagnosis: unilateral impaction with blood in left external auditory canal Post-procedure diagnosis: same Indication: bilateral cerumen impaction; given patient's otologic complaints and history as well as for improved and comprehensive examination of external  ear and tympanic membrane, bilateral otologic examination using microscope was performed and impacted cerumen removed  Procedure: Patient was placed semi-recumbent. Both ear canals were examined using the microscope with findings above. blood removed from the left external auditory canal using suction and currette with improvement in EAC examination and patency. Left: EAC was patent. TM was intact . Middle ear was aerated. Drainage: none Right: EAC was patent. TM was intact . Middle ear was aerated . Drainage: none Patient tolerated the procedure well.   Impression & Plans:  Radin Raptis is a 89 y.o. male with the following   Assessment and Plan    Impacted left EAC with dried blood,left Chronic impacted cerumen with dried blood in the left ear. No hearing loss, pain, or infections. Residual flaking dry blood on tympanic membrane not expected to cause issues. - Performed suction cleaning of the left ear canal. - Instructed to monitor for symptoms such as hearing loss, itching, or pain, and return if these occur. - Discussed the option of using ear drops if symptoms develop, advised caution. - Patient satisfied with hearing and declined hearing evaluation          - f/u PRN   Thank you for allowing me the opportunity to care for your patient. Please do not hesitate to contact me should you have any other questions.  Sincerely, Chyrl Cohen PA-C [facility] Phone: [phone] Fax: [phone]  11/04/2024, 3:10 PM

## 2024-11-04 NOTE — Progress Notes (Unsigned)
  [address] [place], [place] [address] [phone]  Audiological Evaluation    Name: Donald Todd     DOB:   July 23, 1929      MRN:   [id number]                                                                                     Service Date: 11/04/2024     Accompanied by: ***   Patient comes today after [name], PA-C sent a referral for a hearing evaluation due to concerns with dizziness.   Symptoms Yes Details  Hearing loss  []    Tinnitus  []    Ear pain/ infections/pressure  []    Balance problems  []    Noise exposure history  []    Previous ear surgeries  []    Family history of hearing loss  []    Amplification  []    Other  []      Otoscopy: Right ear: Clear external ear canal and notable landmarks visualized on the tympanic membrane. Left ear:  Clear external ear canal and notable landmarks visualized on the tympanic membrane.  Tympanometry: Right ear: {tympanometry results:31367} Left ear: {tympanometry results:31367}    Hearing Evaluation The hearing test results were completed under inserts and results are deemed to be of good reliability. Test technique:  conventional    Pure tone Audiometry: Right ear- *** sensorineural hearing loss from *** Hz - *** Hz. Left ear-  *** sensorineural hearing loss from *** Hz - *** Hz.  Speech Audiometry: Right ear- Speech Reception Threshold (SRT) was obtained at *** dBHL. Left ear-Speech Reception Threshold (SRT) was obtained at *** dBHL.   Word Recognition Score Tested using NU-6 (recorded) Right ear: ***% was obtained at a presentation level of *** dBHL with contralateral masking which is deemed as  {word recognition score:31373}. Left ear: ***% was obtained at a presentation level of *** dBHL with contralateral masking which is deemed as  {word recognition score:31373}.   Impression: {Word recognition Score interpretation:12/29/1930::There is not a significant difference in pure-tone thresholds  between ears.,There is not a significant difference in the word recognition score in between ears. }   Recommendations: Follow up with ENT as scheduled for today Return for a hearing evaluation if concerns with hearing changes arise or per MD recommendation. Consider a communication needs assessment after medical clearance for hearing aids is obtained.   Kijuana Ruppel MARIE LEROUX-MARTINEZ, AUD

## 2024-11-07 ENCOUNTER — Ambulatory Visit: Payer: Self-pay | Admitting: Hematology & Oncology

## 2024-11-08 NOTE — Telephone Encounter (Signed)
-----   Message from Maude JONELLE Crease sent at 11/07/2024 12:28 PM EST ----- Please call and let him know that the MRI of the brain looks okay.  There is no tumors up in the brain.  Thanks.  Jeralyn ----- Message ----- From: Rebecka, Rad Results In Sent: 11/07/2024   9:55 AM EST To: Maude JONELLE Crease, MD

## 2024-11-08 NOTE — Telephone Encounter (Signed)
 Left voicemail with the following results:  the MRI of the brain looks okay. There is no tumors up in the brain. Thanks. Pt aware to call back with any questions or concerns.

## 2024-11-11 DIAGNOSIS — R441 Visual hallucinations: Secondary | ICD-10-CM | POA: Diagnosis not present

## 2024-11-11 DIAGNOSIS — F331 Major depressive disorder, recurrent, moderate: Secondary | ICD-10-CM | POA: Diagnosis not present

## 2024-11-11 DIAGNOSIS — F411 Generalized anxiety disorder: Secondary | ICD-10-CM | POA: Diagnosis not present

## 2024-11-11 DIAGNOSIS — F5105 Insomnia due to other mental disorder: Secondary | ICD-10-CM | POA: Diagnosis not present

## 2024-11-11 DIAGNOSIS — G3184 Mild cognitive impairment, so stated: Secondary | ICD-10-CM | POA: Diagnosis not present

## 2024-11-14 DIAGNOSIS — N1832 Chronic kidney disease, stage 3b: Secondary | ICD-10-CM | POA: Diagnosis not present

## 2024-11-14 DIAGNOSIS — I129 Hypertensive chronic kidney disease with stage 1 through stage 4 chronic kidney disease, or unspecified chronic kidney disease: Secondary | ICD-10-CM | POA: Diagnosis not present

## 2024-11-14 DIAGNOSIS — G3184 Mild cognitive impairment, so stated: Secondary | ICD-10-CM | POA: Diagnosis not present

## 2024-11-14 DIAGNOSIS — I480 Paroxysmal atrial fibrillation: Secondary | ICD-10-CM | POA: Diagnosis not present

## 2024-11-14 DIAGNOSIS — E785 Hyperlipidemia, unspecified: Secondary | ICD-10-CM | POA: Diagnosis not present

## 2024-12-16 ENCOUNTER — Emergency Department (HOSPITAL_COMMUNITY)
Admission: EM | Admit: 2024-12-16 | Discharge: 2024-12-16 | Disposition: A | Discharge status: Home / Self Care | Attending: Emergency Medicine | Admitting: Emergency Medicine

## 2024-12-16 ENCOUNTER — Emergency Department (HOSPITAL_COMMUNITY)

## 2024-12-16 ENCOUNTER — Other Ambulatory Visit: Payer: Self-pay

## 2024-12-16 DIAGNOSIS — R531 Weakness: Secondary | ICD-10-CM | POA: Diagnosis not present

## 2024-12-16 DIAGNOSIS — R519 Headache, unspecified: Secondary | ICD-10-CM | POA: Diagnosis not present

## 2024-12-16 DIAGNOSIS — Z85528 Personal history of other malignant neoplasm of kidney: Secondary | ICD-10-CM | POA: Insufficient documentation

## 2024-12-16 DIAGNOSIS — R42 Dizziness and giddiness: Secondary | ICD-10-CM | POA: Insufficient documentation

## 2024-12-16 DIAGNOSIS — I4891 Unspecified atrial fibrillation: Secondary | ICD-10-CM | POA: Insufficient documentation

## 2024-12-16 DIAGNOSIS — Z7901 Long term (current) use of anticoagulants: Secondary | ICD-10-CM | POA: Diagnosis not present

## 2024-12-16 LAB — RESP PANEL BY RT-PCR (RSV, FLU A&B, COVID)  RVPGX2
Influenza A by PCR: NEGATIVE
Influenza B by PCR: NEGATIVE
Resp Syncytial Virus by PCR: NEGATIVE
SARS Coronavirus 2 by RT PCR: NEGATIVE

## 2024-12-16 LAB — URINALYSIS, W/ REFLEX TO CULTURE (INFECTION SUSPECTED)
Bacteria, UA: NONE SEEN
Bilirubin Urine: NEGATIVE
Glucose, UA: NEGATIVE mg/dL
Hgb urine dipstick: NEGATIVE
Ketones, ur: NEGATIVE mg/dL
Leukocytes,Ua: NEGATIVE
Nitrite: NEGATIVE
Protein, ur: NEGATIVE mg/dL
Specific Gravity, Urine: 1.013 (ref 1.005–1.030)
pH: 5 (ref 5.0–8.0)

## 2024-12-16 LAB — I-STAT CG4 LACTIC ACID, ED: Lactic Acid, Venous: 1 mmol/L (ref 0.5–1.9)

## 2024-12-16 LAB — CBC WITH DIFFERENTIAL/PLATELET
Abs Immature Granulocytes: 0.04 K/uL (ref 0.00–0.07)
Basophils Absolute: 0 K/uL (ref 0.0–0.1)
Basophils Relative: 0 %
Eosinophils Absolute: 0.2 K/uL (ref 0.0–0.5)
Eosinophils Relative: 2 %
HCT: 41.5 % (ref 39.0–52.0)
Hemoglobin: 13.5 g/dL (ref 13.0–17.0)
Immature Granulocytes: 1 %
Lymphocytes Relative: 15 %
Lymphs Abs: 1 K/uL (ref 0.7–4.0)
MCH: 31.9 pg (ref 26.0–34.0)
MCHC: 32.5 g/dL (ref 30.0–36.0)
MCV: 98.1 fL (ref 80.0–100.0)
Monocytes Absolute: 0.5 K/uL (ref 0.1–1.0)
Monocytes Relative: 7 %
Neutro Abs: 5.1 K/uL (ref 1.7–7.7)
Neutrophils Relative %: 75 %
Platelets: 116 K/uL — ABNORMAL LOW (ref 150–400)
RBC: 4.23 MIL/uL (ref 4.22–5.81)
RDW: 14.5 % (ref 11.5–15.5)
WBC: 6.8 K/uL (ref 4.0–10.5)
nRBC: 0 % (ref 0.0–0.2)

## 2024-12-16 LAB — COMPREHENSIVE METABOLIC PANEL WITH GFR
ALT: 61 U/L — ABNORMAL HIGH (ref 0–44)
AST: 35 U/L (ref 15–41)
Albumin: 3.8 g/dL (ref 3.5–5.0)
Alkaline Phosphatase: 77 U/L (ref 38–126)
Anion gap: 8 (ref 5–15)
BUN: 26 mg/dL — ABNORMAL HIGH (ref 8–23)
CO2: 26 mmol/L (ref 22–32)
Calcium: 9.2 mg/dL (ref 8.9–10.3)
Chloride: 110 mmol/L (ref 98–111)
Creatinine, Ser: 1.34 mg/dL — ABNORMAL HIGH (ref 0.61–1.24)
GFR, Estimated: 49 mL/min — ABNORMAL LOW
Glucose, Bld: 120 mg/dL — ABNORMAL HIGH (ref 70–99)
Potassium: 4.6 mmol/L (ref 3.5–5.1)
Sodium: 144 mmol/L (ref 135–145)
Total Bilirubin: 0.6 mg/dL (ref 0.0–1.2)
Total Protein: 6.2 g/dL — ABNORMAL LOW (ref 6.5–8.1)

## 2024-12-16 LAB — TROPONIN T, HIGH SENSITIVITY
Troponin T High Sensitivity: 39 ng/L — ABNORMAL HIGH (ref 0–19)
Troponin T High Sensitivity: 41 ng/L — ABNORMAL HIGH (ref 0–19)

## 2024-12-16 MED ORDER — NALOXONE HCL 0.4 MG/ML IJ SOLN: 0.4000 mg | Freq: Once | INTRAMUSCULAR | Status: DC

## 2024-12-16 NOTE — Discharge Instructions (Addendum)
 Donald Todd had blood tests, urine and x-ray done.  There were no emergency findings.  Specifically there was no evidence of urine infection, pneumonia, kidney injury.  His COVID and flu testing were negative.  I recommended that he drink more water and follow-up with his facility provider.  He was not given any medications in the ED

## 2024-12-16 NOTE — ED Triage Notes (Addendum)
 Pt BIBA from [facility]. Hx afib, mild memory lapses including hallucinations. Today has been agitated and hallucinating, pt reports dizziness esp with movement, headache, racing heart. In afib rate 100-130. Does not feel like it is racing now. Baseline mentation. Stroke screen negative.  Pt states he is dizzy most days but that it goes away after he eats lunch. It has not been going away the last week or so and reports that his balance has been getting worse over the last few days.  140/90 Cbg 91 12L afib

## 2024-12-16 NOTE — ED Provider Notes (Signed)
 " [name] AT [facility] Provider Note   CSN: [id number] Arrival date & time: 12/16/24  1743     Patient presents with: Dizziness   Donald Todd is a 89 y.o. male presenting to the ED with complaint of dizziness and general weakness.  The patient lives in assisted living at [facility].  He has a history of A-fib and takes Eliquis  for that.  He reports he has felt weaker than normal throughout the day today.  He typically wakes up and has lightheadedness lasting for a few hours between breakfast and lunch but it goes away.  Today it was more persistent.  He felt unsteady on his feet.  He denies to me that he had vertigo, and says he has had vertigo in the past and this does not feel like that.  He reports he had a mild temporal headache that went away.  He says he gets these headaches about 3-4 times a week, but they go away with Tylenol .  He currently has no headache.  The patient's son at bedside provides supplemental history, and spoke to the patient's daughter who saw him today.  They report that they were concerned about his dizziness and the patient complaining of the racing heart.  I did review external records including his cardiology evaluation most recently from August of last year.  Patient does have a history of coronary disease.  He wore a Zio patch showing A-fib and flutter with 100% burden, and his cardiologist had opted for rate control.  He is asymptomatic from this standpoint and is on Eliquis .  He also follows with oncology for renal cell carcinoma.  MRI of the brain with and without contrast performed at the end of November, 6 weeks ago, with no emergent findings, noting old lacunar infarcts and chronic white matter disease.  {Add pertinent medical, surgical, social history, OB history to HPI:32947} HPI     Prior to Admission medications  Medication Sig Start Date End Date Taking? Authorizing Provider  acetaminophen  (TYLENOL ) 500  MG tablet Take 500 mg by mouth every 6 (six) hours as needed.    [provider]  amLODipine  (NORVASC ) 5 MG tablet Take 5 mg by mouth daily.    [provider]  apixaban  (ELIQUIS ) 5 MG TABS tablet Take 1 tablet (5 mg total) by mouth 2 (two) times daily. 02/15/24   Drusilla Sabas RAMAN, MD  atorvastatin  (LIPITOR) 20 MG tablet Take 20 mg by mouth every evening.    [provider]  calcium  carbonate (TUMS - DOSED IN MG ELEMENTAL CALCIUM ) 500 MG chewable tablet Chew 1,000 mg by mouth daily as needed for indigestion or heartburn.    [provider]  diclofenac  Sodium (VOLTAREN ) 1 % GEL Apply 2 g topically 3 (three) times daily. To both knees    [provider]  docusate sodium  (COLACE) 100 MG capsule Take 1 capsule (100 mg total) by mouth every 12 (twelve) hours. 03/10/21   Raford Lenis, MD  fluticasone  (FLONASE ) 50 MCG/ACT nasal spray Place 2 sprays into both nostrils daily. 07/03/24 07/03/25  Fenton, Clint R, PA  loperamide (IMODIUM A-D) 2 MG tablet Take 2 mg by mouth every 6 (six) hours as needed for diarrhea or loose stools.    [provider]  loratadine  (CLARITIN ) 10 MG tablet Take 10 mg by mouth every evening. 09/29/21   [provider]  meclizine  (ANTIVERT ) 25 MG tablet Take 25 mg by mouth every 12 (twelve) hours as  needed for dizziness. Patient taking differently: Take 25 mg by mouth every 8 (eight) hours as needed for dizziness.    [provider]  metoprolol  succinate (TOPROL -XL) 50 MG 24 hr tablet Take 100 mg in A.M. and 50 mg in P.M. 06/12/24   Fenton, Clint R, PA  Multiple Vitamins-Minerals (PRESERVISION AREDS 2) CAPS Take 1 capsule by mouth 2 (two) times daily.    [provider]  nitroGLYCERIN  (NITROSTAT ) 0.4 MG SL tablet Place 1 tablet (0.4 mg total) under the tongue every 5 (five) minutes as needed for chest pain (Max 3 doeses). Patient not taking: Reported on 10/21/2024 02/15/24   Drusilla Sabas RAMAN, MD  Nutritional  Supplements (ENSURE ORIGINAL) LIQD Take 1 Container by mouth 2 (two) times daily between meals.    [provider]  polyethylene glycol powder (GLYCOLAX /MIRALAX ) 17 GM/SCOOP powder Take 8.5 g by mouth daily. Dissolve 1 capful (17g) in 4-8 ounces of liquid and take by mouth daily.    [provider]  QUEtiapine  (SEROQUEL  XR) 50 MG TB24 24 hr tablet Take 100 mg by mouth at bedtime. 07/01/24   [provider]  tamsulosin  (FLOMAX ) 0.4 MG CAPS capsule Take 1 capsule (0.4 mg total) by mouth daily after supper. 02/15/24   Drusilla Sabas RAMAN, MD    Allergies: Tape, Hydrocodone , Iohexol, and Roxicodone  [oxycodone ]    Review of Systems  Updated Vital Signs BP (!) 139/94 (BP Location: Left Arm)   Pulse 96   Temp 98.6 F (37 C) (Oral)   Resp (!) 27   SpO2 98%   Physical Exam Constitutional:      General: He is not in acute distress. HENT:     Head: Normocephalic and atraumatic.  Eyes:     Conjunctiva/sclera: Conjunctivae normal.     Pupils: Pupils are equal, round, and reactive to light.  Cardiovascular:     Rate and Rhythm: Normal rate. Rhythm irregular.  Pulmonary:     Effort: Pulmonary effort is normal. No respiratory distress.  Abdominal:     General: There is no distension.     Tenderness: There is no abdominal tenderness.  Skin:    General: Skin is warm and dry.  Neurological:     General: No focal deficit present.     Mental Status: He is alert. Mental status is at baseline.  Psychiatric:        Mood and Affect: Mood normal.        Behavior: Behavior normal.     (all labs ordered are listed, but only abnormal results are displayed) Labs Reviewed  RESP PANEL BY RT-PCR (RSV, FLU A&B, COVID)  RVPGX2  COMPREHENSIVE METABOLIC PANEL WITH GFR  CBC WITH DIFFERENTIAL/PLATELET  URINALYSIS, W/ REFLEX TO CULTURE (INFECTION SUSPECTED)  I-STAT CG4 LACTIC ACID, ED  TROPONIN T, HIGH SENSITIVITY    EKG: None  Radiology: No results found.  {Document cardiac  monitor, telemetry assessment procedure when appropriate:32947} Procedures   Medications Ordered in the ED - No data to display    {Click here for ABCD2, HEART and other calculators REFRESH Note before signing:1}                              Medical Decision Making Amount and/or Complexity of Data Reviewed Labs: ordered. Radiology: ordered. ECG/medicine tests: ordered.   This patient presents to the ED with concern for dizziness, lightheadedness. This involves an extensive number of treatment options, and is a complaint that  carries with it a high risk of complications and morbidity.  The differential diagnosis includes ***  Co-morbidities that complicate the patient evaluation: ***  Additional history obtained from ***  External records from outside source obtained and reviewed including ***  I ordered and personally interpreted labs.  The pertinent results include:  ***  I ordered imaging studies including *** I independently visualized and interpreted imaging which showed *** I agree with the radiologist interpretation  The patient was maintained on a cardiac monitor.  I personally viewed and interpreted the cardiac monitored which showed an underlying rhythm of: ***  Per my interpretation the patient's ECG shows ***  I ordered medication including ***  for *** I have reviewed the patients home medicines and have made adjustments as needed  Test Considered: ***  I requested consultation with the ***,  and discussed lab and imaging findings as well as pertinent plan - they recommend: ***  After the interventions noted above, I reevaluated the patient and found that they have: {resolved/improved/worsened:23923::improved}  Social Determinants of Health:***  Dispostion:  After consideration of the diagnostic results and the patients response to treatment, I feel that the patent would benefit from ***.   {Document critical care time when appropriate  Document review  of labs and clinical decision tools ie [id number], etc  Document your independent review of radiology images and any outside records  Document your discussion with family members, caretakers and with consultants  Document social determinants of health affecting pt's care  Document your decision making why or why not admission, treatments were needed:32947:::1}   Final diagnoses:  None    ED Discharge Orders     None        "

## 2024-12-27 ENCOUNTER — Inpatient Hospital Stay: Attending: Hematology & Oncology

## 2024-12-27 ENCOUNTER — Encounter: Payer: Self-pay | Admitting: Hematology & Oncology

## 2024-12-27 ENCOUNTER — Inpatient Hospital Stay: Admitting: Hematology & Oncology

## 2024-12-27 VITALS — BP 151/81 | HR 90 | Temp 97.6°F | Resp 17 | Wt 139.1 lb

## 2024-12-27 DIAGNOSIS — R918 Other nonspecific abnormal finding of lung field: Secondary | ICD-10-CM | POA: Insufficient documentation

## 2024-12-27 DIAGNOSIS — C7801 Secondary malignant neoplasm of right lung: Secondary | ICD-10-CM

## 2024-12-27 DIAGNOSIS — C642 Malignant neoplasm of left kidney, except renal pelvis: Secondary | ICD-10-CM | POA: Insufficient documentation

## 2024-12-27 DIAGNOSIS — M8448XA Pathological fracture, other site, initial encounter for fracture: Secondary | ICD-10-CM | POA: Diagnosis not present

## 2024-12-27 DIAGNOSIS — Z79899 Other long term (current) drug therapy: Secondary | ICD-10-CM | POA: Insufficient documentation

## 2024-12-27 DIAGNOSIS — Z905 Acquired absence of kidney: Secondary | ICD-10-CM | POA: Insufficient documentation

## 2024-12-27 DIAGNOSIS — Z923 Personal history of irradiation: Secondary | ICD-10-CM | POA: Diagnosis not present

## 2024-12-27 DIAGNOSIS — R7303 Prediabetes: Secondary | ICD-10-CM

## 2024-12-27 LAB — CMP (CANCER CENTER ONLY)
ALT: 26 U/L (ref 0–44)
AST: 18 U/L (ref 15–41)
Albumin: 4.1 g/dL (ref 3.5–5.0)
Alkaline Phosphatase: 86 U/L (ref 38–126)
Anion gap: 10 (ref 5–15)
BUN: 27 mg/dL — ABNORMAL HIGH (ref 8–23)
CO2: 26 mmol/L (ref 22–32)
Calcium: 9.3 mg/dL (ref 8.9–10.3)
Chloride: 105 mmol/L (ref 98–111)
Creatinine: 1.53 mg/dL — ABNORMAL HIGH (ref 0.61–1.24)
GFR, Estimated: 42 mL/min — ABNORMAL LOW
Glucose, Bld: 103 mg/dL — ABNORMAL HIGH (ref 70–99)
Potassium: 5 mmol/L (ref 3.5–5.1)
Sodium: 141 mmol/L (ref 135–145)
Total Bilirubin: 0.7 mg/dL (ref 0.0–1.2)
Total Protein: 6.4 g/dL — ABNORMAL LOW (ref 6.5–8.1)

## 2024-12-27 LAB — CBC WITH DIFFERENTIAL (CANCER CENTER ONLY)
Abs Immature Granulocytes: 0.06 K/uL (ref 0.00–0.07)
Basophils Absolute: 0.1 K/uL (ref 0.0–0.1)
Basophils Relative: 1 %
Eosinophils Absolute: 0.3 K/uL (ref 0.0–0.5)
Eosinophils Relative: 5 %
HCT: 42.1 % (ref 39.0–52.0)
Hemoglobin: 13.6 g/dL (ref 13.0–17.0)
Immature Granulocytes: 1 %
Lymphocytes Relative: 16 %
Lymphs Abs: 1.2 K/uL (ref 0.7–4.0)
MCH: 31 pg (ref 26.0–34.0)
MCHC: 32.3 g/dL (ref 30.0–36.0)
MCV: 95.9 fL (ref 80.0–100.0)
Monocytes Absolute: 0.8 K/uL (ref 0.1–1.0)
Monocytes Relative: 10 %
Neutro Abs: 5.1 K/uL (ref 1.7–7.7)
Neutrophils Relative %: 67 %
Platelet Count: 140 K/uL — ABNORMAL LOW (ref 150–400)
RBC: 4.39 MIL/uL (ref 4.22–5.81)
RDW: 14.3 % (ref 11.5–15.5)
WBC Count: 7.6 K/uL (ref 4.0–10.5)
nRBC: 0 % (ref 0.0–0.2)

## 2024-12-27 LAB — LACTATE DEHYDROGENASE: LDH: 226 U/L (ref 105–235)

## 2024-12-27 LAB — PREALBUMIN: Prealbumin: 19 mg/dL (ref 18–38)

## 2024-12-27 LAB — TSH: TSH: 4.18 u[IU]/mL (ref 0.350–4.500)

## 2024-12-27 NOTE — Progress Notes (Signed)
 Hematology and Oncology Follow Up Visit  Donald Todd [id number] 1929-10-18 89 y.o. 12/27/2024 11:10 AM Donald Todd, NPNo ref. 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  Interim History: Mr. Broad returns today for follow-up.  We had last seen him back in November.  Prior to that, it been over a year.  We did go ahead and get him set up with a CT scan.  This is done on 11/01/2024.  Unfortunately, looks like all the scan, there does appear to be a couple nodules that are little bit larger in the lung.  In the right middle lobe a pulmonary nodule now measures 1.5 cm.  In the posterior apex nodule now measures 5 mm.  Everything else appears to be stable.  There does appear to be a new pathologic fracture at L2.  Again, he is asymptomatic.  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  Given his age and marginal performance status, I really do not think that he would be a good candidate for any systemic therapy.  I probably would see if radiosurgery could be done.  I think this to be reasonable.  I think he could handle this..  Otherwise, he gets tired very quickly.  He has had problems with atrial fibrillation.  He was in the ER a week or so ago.  He is on quite a few medications.  His appetite seems to be doing okay.  He has had no bleeding.  He has had no headache.  He is on Eliquis .  Overall, I will say that his performance status is probably ECOG 2-3.    Medications: Updated on review. Current Outpatient Medications  Medication Sig Dispense Refill   acetaminophen  (TYLENOL ) 500 MG tablet Take 500 mg by mouth every 6 (six) hours as needed.     amLODipine  (NORVASC ) 5 MG tablet Take 5 mg by mouth daily.     apixaban  (ELIQUIS ) 5 MG TABS tablet Take 1 tablet (5 mg total) by mouth 2 (two) times daily. 60 tablet 2   atorvastatin  (LIPITOR) 20  MG tablet Take 20 mg by mouth every evening.     calcium  carbonate (TUMS - DOSED IN MG ELEMENTAL CALCIUM ) 500 MG chewable tablet Chew 1,000 mg by mouth daily as needed for indigestion or heartburn.     diclofenac  Sodium (VOLTAREN ) 1 % GEL Apply 2 g topically 3 (three) times daily. To both knees     docusate sodium  (COLACE) 100 MG capsule Take 1 capsule (100 mg total) by mouth every 12 (twelve) hours. 60 capsule 0   fluticasone  (FLONASE ) 50 MCG/ACT nasal spray Place 2 sprays into both nostrils daily.     loperamide (IMODIUM A-D) 2 MG tablet Take 2 mg by mouth every 6 (six) hours as needed for diarrhea or loose stools.     loratadine  (CLARITIN ) 10 MG tablet Take 10 mg by mouth every evening.     meclizine  (ANTIVERT ) 25 MG tablet Take 25 mg by mouth every 12 (twelve) hours as needed for dizziness. (Patient taking differently: Take 25 mg by mouth every 8 (eight) hours as needed for dizziness.)     metoprolol  succinate (TOPROL -XL) 50 MG 24 hr tablet Take 100 mg in A.M. and 50 mg in P.M. 90 tablet 6   Multiple Vitamins-Minerals (PRESERVISION AREDS 2) CAPS Take 1 capsule by mouth 2 (two) times daily.     nitroGLYCERIN  (  NITROSTAT ) 0.4 MG SL tablet Place 1 tablet (0.4 mg total) under the tongue every 5 (five) minutes as needed for chest pain (Max 3 doeses). (Patient not taking: Reported on 10/21/2024) 25 tablet 3   Nutritional Supplements (ENSURE ORIGINAL) LIQD Take 1 Container by mouth 2 (two) times daily between meals.     polyethylene glycol powder (GLYCOLAX /MIRALAX ) 17 GM/SCOOP powder Take 8.5 g by mouth daily. Dissolve 1 capful (17g) in 4-8 ounces of liquid and take by mouth daily.     QUEtiapine  (SEROQUEL  XR) 50 MG TB24 24 hr tablet Take 100 mg by mouth at bedtime.     tamsulosin  (FLOMAX ) 0.4 MG CAPS capsule Take 1 capsule (0.4 mg total) by mouth daily after supper. 180 capsule 3   No current facility-administered medications for this visit.     Allergies:  Allergies  Allergen Reactions   Tape  Hives    Surgical tape - causes blisters   Hydrocodone  Other (See Comments)    Tremors    Iohexol Other (See Comments)     Desc: PT DOES RECALL REACTION JUST SAYS IT WAS A LONG TIME AGO FOR KIDNEY X-RAYS AND HE DIDN'T TOLERATE IT WELL-ARS 05/02/09-NOTE E-CHART STATES A HOT FEELING IS HIS REACTION, BUT HE CAN'T CONFIRM THAT WAS ALL    Roxicodone  [Oxycodone ] Other (See Comments)    Tremors    Review of Systems  Constitutional: Negative.   HENT: Negative.    Eyes: Negative.   Respiratory: Negative.    Cardiovascular: Negative.   Gastrointestinal: Negative.   Genitourinary: Negative.   Musculoskeletal: Negative.   Skin: Negative.   Neurological: Negative.   Endo/Heme/Allergies: Negative.   Psychiatric/Behavioral: Negative.        Physical Exam:  Blood pressure (!) 151/81, pulse 90, temperature 97.6 F (36.4 C), temperature source Oral, resp. rate 17, weight 139 lb 1.9 oz (63.1 kg), SpO2 99%.  Physical Exam Vitals reviewed.  HENT:     Head: Normocephalic and atraumatic.  Eyes:     Pupils: Pupils are equal, round, and reactive to light.  Cardiovascular:     Rate and Rhythm: Normal rate and regular rhythm.     Heart sounds: Normal heart sounds.  Pulmonary:     Effort: Pulmonary effort is normal.     Breath sounds: Normal breath sounds.  Abdominal:     General: Bowel sounds are normal.     Palpations: Abdomen is soft.  Musculoskeletal:        General: No tenderness or deformity. Normal range of motion.     Cervical back: Normal range of motion.  Lymphadenopathy:     Cervical: No cervical adenopathy.  Skin:    General: Skin is warm and dry.     Findings: No erythema or rash.  Neurological:     Mental Status: He is alert and oriented to person, place, and time.  Psychiatric:        Behavior: Behavior normal.        Thought Content: Thought content normal.        Judgment: Judgment normal.     Lab Results: Lab Results  Component Value Date   WBC 7.6 12/27/2024    HGB 13.6 12/27/2024   HCT 42.1 12/27/2024   MCV 95.9 12/27/2024   PLT 140 (L) 12/27/2024     Chemistry      Component Value Date/Time   NA 141 12/27/2024 1025   NA 139 04/10/2024 1341   K 5.0 12/27/2024 1025   CL 105 12/27/2024 1025  CO2 26 12/27/2024 1025   BUN 27 (H) 12/27/2024 1025   BUN 33 04/10/2024 1341   CREATININE 1.53 (H) 12/27/2024 1025      Component Value Date/Time   CALCIUM  9.3 12/27/2024 1025   ALKPHOS 86 12/27/2024 1025   AST 18 12/27/2024 1025   ALT 26 12/27/2024 1025   BILITOT 0.7 12/27/2024 1025     Study Result     Impression and Plan:  Donald Todd is a very nice 89 year old white male.  He has a past history of clear-cell carcinoma of the kidney.  He has had a past left nephrectomy.  This was back in the 2002.  He has had radiation therapy for recurrence.  This was in 2022.  Now, looks like there is progression in a couple lung nodules.  Again I will see if Radiation Oncology will see him for the possibility of radiosurgery.  I think his daughter comes in with him.  Unfortunately, a son, who lives in [place] , was diagnosed with glioblastoma.  Again this is all about quality of life.  Hopefully we can treat him so that his quality of life will be maintained.  Again he served in the [employer].  He is a true [employer] hero.  And we will continue to do fight for him and do what we can to help preserve his lifestyle.  I would like to get him back to see me in another 2 months or so.   Maude JONELLE Crease, MD 1/23/202611:10 AM

## 2025-02-21 ENCOUNTER — Inpatient Hospital Stay: Admitting: Hematology & Oncology

## 2025-02-21 ENCOUNTER — Inpatient Hospital Stay
